# Patient Record
Sex: Female | Born: 1955 | Race: Black or African American | Hispanic: No | Marital: Single | Smoking: Former smoker
Health system: Southern US, Community
[De-identification: ages and names within clinical notes are randomized; demographics above are authoritative.]

## PROBLEM LIST (undated history)

## (undated) ENCOUNTER — Ambulatory Visit

## (undated) DIAGNOSIS — F1010 Alcohol abuse, uncomplicated: Secondary | ICD-10-CM

## (undated) DIAGNOSIS — Z9989 Dependence on other enabling machines and devices: Secondary | ICD-10-CM

## (undated) DIAGNOSIS — J439 Emphysema, unspecified: Secondary | ICD-10-CM

## (undated) DIAGNOSIS — J449 Chronic obstructive pulmonary disease, unspecified: Secondary | ICD-10-CM

## (undated) DIAGNOSIS — J45909 Unspecified asthma, uncomplicated: Secondary | ICD-10-CM

## (undated) DIAGNOSIS — U071 COVID-19: Secondary | ICD-10-CM

## (undated) DIAGNOSIS — R32 Unspecified urinary incontinence: Secondary | ICD-10-CM

## (undated) DIAGNOSIS — K219 Gastro-esophageal reflux disease without esophagitis: Secondary | ICD-10-CM

## (undated) DIAGNOSIS — G4733 Obstructive sleep apnea (adult) (pediatric): Secondary | ICD-10-CM

## (undated) DIAGNOSIS — K279 Peptic ulcer, site unspecified, unspecified as acute or chronic, without hemorrhage or perforation: Secondary | ICD-10-CM

## (undated) DIAGNOSIS — L509 Urticaria, unspecified: Secondary | ICD-10-CM

## (undated) DIAGNOSIS — T783XXA Angioneurotic edema, initial encounter: Secondary | ICD-10-CM

## (undated) DIAGNOSIS — N39498 Other specified urinary incontinence: Secondary | ICD-10-CM

## (undated) DIAGNOSIS — G5600 Carpal tunnel syndrome, unspecified upper limb: Secondary | ICD-10-CM

## (undated) DIAGNOSIS — Z720 Tobacco use: Secondary | ICD-10-CM

## (undated) HISTORY — DX: Urticaria, unspecified: L50.9

## (undated) HISTORY — DX: Gastro-esophageal reflux disease without esophagitis: K21.9

## (undated) HISTORY — PX: COLONOSCOPY: SHX5424

## (undated) HISTORY — DX: Emphysema, unspecified: J43.9

## (undated) HISTORY — DX: Alcohol abuse, uncomplicated: F10.10

## (undated) HISTORY — DX: Peptic ulcer, site unspecified, unspecified as acute or chronic, without hemorrhage or perforation: K27.9

## (undated) HISTORY — DX: Chronic obstructive pulmonary disease, unspecified: J44.9

## (undated) HISTORY — DX: Unspecified asthma, uncomplicated: J45.909

## (undated) HISTORY — PX: TUBAL LIGATION: SHX77

## (undated) HISTORY — DX: COVID-19: U07.1

## (undated) HISTORY — PX: MANDIBLE SURGERY: SHX707

## (undated) HISTORY — DX: Tobacco use: Z72.0

---

## 1992-04-19 HISTORY — PX: BREAST EXCISIONAL BIOPSY: SUR124

## 1998-08-07 ENCOUNTER — Emergency Department (HOSPITAL_COMMUNITY): Admission: EM | Admit: 1998-08-07 | Discharge: 1998-08-07 | Payer: Self-pay | Admitting: Emergency Medicine

## 1998-10-03 ENCOUNTER — Encounter: Payer: Self-pay | Admitting: Emergency Medicine

## 1998-10-03 ENCOUNTER — Emergency Department (HOSPITAL_COMMUNITY): Admission: EM | Admit: 1998-10-03 | Discharge: 1998-10-03 | Payer: Self-pay | Admitting: Emergency Medicine

## 1999-05-25 ENCOUNTER — Emergency Department (HOSPITAL_COMMUNITY): Admission: EM | Admit: 1999-05-25 | Discharge: 1999-05-25 | Payer: Self-pay | Admitting: Internal Medicine

## 2001-04-15 ENCOUNTER — Emergency Department (HOSPITAL_COMMUNITY): Admission: EM | Admit: 2001-04-15 | Discharge: 2001-04-15 | Payer: Self-pay | Admitting: Emergency Medicine

## 2003-03-22 ENCOUNTER — Inpatient Hospital Stay (HOSPITAL_COMMUNITY): Admission: AD | Admit: 2003-03-22 | Discharge: 2003-03-23 | Payer: Self-pay | Admitting: Family Medicine

## 2003-03-25 ENCOUNTER — Inpatient Hospital Stay (HOSPITAL_COMMUNITY): Admission: AD | Admit: 2003-03-25 | Discharge: 2003-03-25 | Payer: Self-pay | Admitting: Obstetrics & Gynecology

## 2003-04-09 ENCOUNTER — Encounter: Admission: RE | Admit: 2003-04-09 | Discharge: 2003-04-09 | Payer: Self-pay | Admitting: Obstetrics and Gynecology

## 2003-04-09 ENCOUNTER — Encounter (INDEPENDENT_AMBULATORY_CARE_PROVIDER_SITE_OTHER): Payer: Self-pay | Admitting: Specialist

## 2003-04-26 ENCOUNTER — Ambulatory Visit (HOSPITAL_COMMUNITY): Admission: RE | Admit: 2003-04-26 | Discharge: 2003-04-26 | Payer: Self-pay | Admitting: Obstetrics and Gynecology

## 2004-07-01 ENCOUNTER — Emergency Department (HOSPITAL_COMMUNITY): Admission: EM | Admit: 2004-07-01 | Discharge: 2004-07-01 | Payer: Self-pay | Admitting: Emergency Medicine

## 2004-10-21 ENCOUNTER — Emergency Department (HOSPITAL_COMMUNITY): Admission: EM | Admit: 2004-10-21 | Discharge: 2004-10-21 | Payer: Self-pay | Admitting: Family Medicine

## 2006-07-02 ENCOUNTER — Emergency Department (HOSPITAL_COMMUNITY): Admission: EM | Admit: 2006-07-02 | Discharge: 2006-07-03 | Payer: Self-pay | Admitting: Emergency Medicine

## 2006-07-09 ENCOUNTER — Inpatient Hospital Stay (HOSPITAL_COMMUNITY): Admission: EM | Admit: 2006-07-09 | Discharge: 2006-07-13 | Payer: Self-pay | Admitting: Emergency Medicine

## 2006-07-09 ENCOUNTER — Ambulatory Visit: Payer: Self-pay | Admitting: Internal Medicine

## 2006-07-11 ENCOUNTER — Ambulatory Visit: Payer: Self-pay | Admitting: Internal Medicine

## 2006-07-14 ENCOUNTER — Telehealth: Payer: Self-pay | Admitting: *Deleted

## 2006-07-14 ENCOUNTER — Ambulatory Visit: Payer: Self-pay | Admitting: Internal Medicine

## 2006-07-26 ENCOUNTER — Ambulatory Visit: Payer: Self-pay | Admitting: Internal Medicine

## 2006-07-26 ENCOUNTER — Encounter (INDEPENDENT_AMBULATORY_CARE_PROVIDER_SITE_OTHER): Payer: Self-pay | Admitting: *Deleted

## 2006-07-26 DIAGNOSIS — J4489 Other specified chronic obstructive pulmonary disease: Secondary | ICD-10-CM | POA: Insufficient documentation

## 2006-07-26 DIAGNOSIS — Z8711 Personal history of peptic ulcer disease: Secondary | ICD-10-CM

## 2006-07-26 DIAGNOSIS — J449 Chronic obstructive pulmonary disease, unspecified: Secondary | ICD-10-CM | POA: Insufficient documentation

## 2006-07-26 LAB — CONVERTED CEMR LAB
ALT: 20 units/L (ref 0–35)
AST: 21 units/L (ref 0–37)
Albumin: 3.3 g/dL — ABNORMAL LOW (ref 3.5–5.2)
Alkaline Phosphatase: 97 units/L (ref 39–117)
BUN: 3 mg/dL — ABNORMAL LOW (ref 6–23)
Basophils Absolute: 0.1 10*3/uL (ref 0.0–0.1)
Basophils Relative: 1 % (ref 0–1)
Calcium: 9.6 mg/dL (ref 8.4–10.5)
Creatinine, Ser: 0.93 mg/dL (ref 0.40–1.20)
Eosinophils Absolute: 1.8 10*3/uL — ABNORMAL HIGH (ref 0.0–0.7)
Glucose, Bld: 98 mg/dL (ref 70–99)
Hemoglobin: 12.9 g/dL (ref 12.0–15.0)
MCHC: 32.8 g/dL (ref 30.0–36.0)
Monocytes Relative: 6 % (ref 3–11)
Neutrophils Relative %: 51 % (ref 43–77)
Platelets: 397 10*3/uL (ref 150–400)
Total Protein: 7 g/dL (ref 6.0–8.3)
WBC: 9.4 10*3/uL (ref 4.0–10.5)

## 2006-07-29 ENCOUNTER — Ambulatory Visit: Payer: Self-pay | Admitting: Internal Medicine

## 2006-07-29 DIAGNOSIS — J441 Chronic obstructive pulmonary disease with (acute) exacerbation: Secondary | ICD-10-CM

## 2006-08-12 ENCOUNTER — Emergency Department (HOSPITAL_COMMUNITY): Admission: EM | Admit: 2006-08-12 | Discharge: 2006-08-12 | Payer: Self-pay | Admitting: *Deleted

## 2006-08-16 ENCOUNTER — Ambulatory Visit: Payer: Self-pay | Admitting: Internal Medicine

## 2006-08-18 ENCOUNTER — Inpatient Hospital Stay (HOSPITAL_COMMUNITY): Admission: AD | Admit: 2006-08-18 | Discharge: 2006-08-21 | Payer: Self-pay | Admitting: Internal Medicine

## 2006-08-18 ENCOUNTER — Telehealth (INDEPENDENT_AMBULATORY_CARE_PROVIDER_SITE_OTHER): Payer: Self-pay | Admitting: *Deleted

## 2006-08-18 ENCOUNTER — Ambulatory Visit: Payer: Self-pay | Admitting: Internal Medicine

## 2006-08-24 ENCOUNTER — Ambulatory Visit: Payer: Self-pay | Admitting: Internal Medicine

## 2006-08-24 DIAGNOSIS — J45909 Unspecified asthma, uncomplicated: Secondary | ICD-10-CM | POA: Insufficient documentation

## 2006-08-30 ENCOUNTER — Ambulatory Visit: Payer: Self-pay | Admitting: Internal Medicine

## 2006-08-30 ENCOUNTER — Encounter (INDEPENDENT_AMBULATORY_CARE_PROVIDER_SITE_OTHER): Payer: Self-pay | Admitting: *Deleted

## 2006-08-31 ENCOUNTER — Telehealth: Payer: Self-pay | Admitting: *Deleted

## 2006-09-01 ENCOUNTER — Encounter (INDEPENDENT_AMBULATORY_CARE_PROVIDER_SITE_OTHER): Payer: Self-pay | Admitting: *Deleted

## 2006-10-25 ENCOUNTER — Encounter (INDEPENDENT_AMBULATORY_CARE_PROVIDER_SITE_OTHER): Payer: Self-pay | Admitting: Internal Medicine

## 2006-10-25 ENCOUNTER — Ambulatory Visit: Payer: Self-pay | Admitting: Pulmonary Disease

## 2006-11-15 ENCOUNTER — Ambulatory Visit: Payer: Self-pay | Admitting: Pulmonary Disease

## 2007-01-15 ENCOUNTER — Ambulatory Visit: Payer: Self-pay | Admitting: Infectious Diseases

## 2007-01-15 ENCOUNTER — Inpatient Hospital Stay (HOSPITAL_COMMUNITY): Admission: EM | Admit: 2007-01-15 | Discharge: 2007-01-17 | Payer: Self-pay | Admitting: Emergency Medicine

## 2007-01-24 ENCOUNTER — Encounter (INDEPENDENT_AMBULATORY_CARE_PROVIDER_SITE_OTHER): Payer: Self-pay | Admitting: Internal Medicine

## 2007-01-24 ENCOUNTER — Ambulatory Visit: Payer: Self-pay | Admitting: *Deleted

## 2007-01-24 ENCOUNTER — Ambulatory Visit: Payer: Self-pay | Admitting: Hospitalist

## 2007-01-24 DIAGNOSIS — N39498 Other specified urinary incontinence: Secondary | ICD-10-CM

## 2007-01-24 DIAGNOSIS — F17200 Nicotine dependence, unspecified, uncomplicated: Secondary | ICD-10-CM

## 2007-01-24 HISTORY — DX: Other specified urinary incontinence: N39.498

## 2007-01-24 LAB — CONVERTED CEMR LAB
Eosinophils Relative: 4 % (ref 0–5)
HCT: 38.6 % (ref 36.0–46.0)
Hemoglobin: 12.7 g/dL (ref 12.0–15.0)
Lymphocytes Relative: 35 % (ref 12–46)
Lymphs Abs: 4.5 10*3/uL — ABNORMAL HIGH (ref 0.7–3.3)
Neutro Abs: 6.7 10*3/uL (ref 1.7–7.7)
Neutrophils Relative %: 54 % (ref 43–77)
Platelets: 335 10*3/uL (ref 150–400)
WBC: 12.8 10*3/uL — ABNORMAL HIGH (ref 4.0–10.5)

## 2007-01-31 ENCOUNTER — Ambulatory Visit: Payer: Self-pay | Admitting: Infectious Diseases

## 2007-05-31 ENCOUNTER — Ambulatory Visit (HOSPITAL_COMMUNITY): Admission: RE | Admit: 2007-05-31 | Discharge: 2007-05-31 | Payer: Self-pay | Admitting: Internal Medicine

## 2007-05-31 ENCOUNTER — Encounter (INDEPENDENT_AMBULATORY_CARE_PROVIDER_SITE_OTHER): Payer: Self-pay | Admitting: *Deleted

## 2007-05-31 ENCOUNTER — Ambulatory Visit: Payer: Self-pay | Admitting: Internal Medicine

## 2007-05-31 DIAGNOSIS — K089 Disorder of teeth and supporting structures, unspecified: Secondary | ICD-10-CM | POA: Insufficient documentation

## 2007-05-31 DIAGNOSIS — K047 Periapical abscess without sinus: Secondary | ICD-10-CM

## 2007-05-31 DIAGNOSIS — M25539 Pain in unspecified wrist: Secondary | ICD-10-CM

## 2007-05-31 LAB — CONVERTED CEMR LAB
ALT: 13 units/L (ref 0–35)
Albumin: 4.2 g/dL (ref 3.5–5.2)
BUN: 10 mg/dL (ref 6–23)
Basophils Relative: 1 % (ref 0–1)
CO2: 21 meq/L (ref 19–32)
Chloride: 105 meq/L (ref 96–112)
Eosinophils Relative: 6 % — ABNORMAL HIGH (ref 0–5)
HCT: 42 % (ref 36.0–46.0)
MCHC: 32.9 g/dL (ref 30.0–36.0)
MCV: 81.6 fL (ref 78.0–100.0)
Monocytes Absolute: 0.7 10*3/uL (ref 0.1–1.0)
RDW: 13.9 % (ref 11.5–15.5)
Total Bilirubin: 0.5 mg/dL (ref 0.3–1.2)

## 2007-06-07 ENCOUNTER — Ambulatory Visit (HOSPITAL_COMMUNITY): Admission: RE | Admit: 2007-06-07 | Discharge: 2007-06-07 | Payer: Self-pay | Admitting: *Deleted

## 2007-06-08 ENCOUNTER — Ambulatory Visit: Payer: Self-pay | Admitting: Internal Medicine

## 2007-06-08 ENCOUNTER — Encounter (INDEPENDENT_AMBULATORY_CARE_PROVIDER_SITE_OTHER): Payer: Self-pay | Admitting: *Deleted

## 2007-06-08 DIAGNOSIS — M654 Radial styloid tenosynovitis [de Quervain]: Secondary | ICD-10-CM

## 2007-08-18 ENCOUNTER — Emergency Department (HOSPITAL_COMMUNITY): Admission: EM | Admit: 2007-08-18 | Discharge: 2007-08-18 | Payer: Self-pay | Admitting: Emergency Medicine

## 2007-11-27 ENCOUNTER — Ambulatory Visit: Payer: Self-pay | Admitting: Internal Medicine

## 2007-11-27 ENCOUNTER — Encounter (INDEPENDENT_AMBULATORY_CARE_PROVIDER_SITE_OTHER): Payer: Self-pay | Admitting: Internal Medicine

## 2007-11-27 DIAGNOSIS — L538 Other specified erythematous conditions: Secondary | ICD-10-CM

## 2007-11-27 LAB — CONVERTED CEMR LAB
BUN: 18 mg/dL (ref 6–23)
Basophils Relative: 1 % (ref 0–1)
CO2: 25 meq/L (ref 19–32)
Chloride: 108 meq/L (ref 96–112)
Creatinine, Ser: 0.97 mg/dL (ref 0.40–1.20)
Eosinophils Absolute: 0.8 10*3/uL — ABNORMAL HIGH (ref 0.0–0.7)
Glucose, Bld: 79 mg/dL (ref 70–99)
Hemoglobin: 14.5 g/dL (ref 12.0–15.0)
Lymphs Abs: 2.5 10*3/uL (ref 0.7–4.0)
MCHC: 33.3 g/dL (ref 30.0–36.0)
MCV: 81.3 fL (ref 78.0–100.0)
Monocytes Absolute: 0.6 10*3/uL (ref 0.1–1.0)
Monocytes Relative: 7 % (ref 3–12)
Neutro Abs: 4.6 10*3/uL (ref 1.7–7.7)
Platelets: 260 10*3/uL (ref 150–400)
Potassium: 4.4 meq/L (ref 3.5–5.3)
RBC: 5.35 M/uL — ABNORMAL HIGH (ref 3.87–5.11)
RDW: 14.5 % (ref 11.5–15.5)
WBC: 8.5 10*3/uL (ref 4.0–10.5)

## 2007-12-11 ENCOUNTER — Ambulatory Visit: Payer: Self-pay | Admitting: Internal Medicine

## 2007-12-20 ENCOUNTER — Ambulatory Visit: Payer: Self-pay | Admitting: Internal Medicine

## 2008-01-13 ENCOUNTER — Emergency Department (HOSPITAL_COMMUNITY): Admission: EM | Admit: 2008-01-13 | Discharge: 2008-01-13 | Payer: Self-pay | Admitting: Emergency Medicine

## 2008-01-21 ENCOUNTER — Inpatient Hospital Stay (HOSPITAL_COMMUNITY): Admission: EM | Admit: 2008-01-21 | Discharge: 2008-01-23 | Payer: Self-pay | Admitting: Emergency Medicine

## 2008-01-21 ENCOUNTER — Ambulatory Visit: Payer: Self-pay | Admitting: Internal Medicine

## 2008-01-23 ENCOUNTER — Encounter (INDEPENDENT_AMBULATORY_CARE_PROVIDER_SITE_OTHER): Payer: Self-pay | Admitting: Internal Medicine

## 2008-01-31 ENCOUNTER — Ambulatory Visit: Payer: Self-pay | Admitting: Infectious Disease

## 2008-02-23 ENCOUNTER — Encounter: Admission: RE | Admit: 2008-02-23 | Discharge: 2008-02-23 | Payer: Self-pay | Admitting: Family Medicine

## 2008-03-25 ENCOUNTER — Encounter (INDEPENDENT_AMBULATORY_CARE_PROVIDER_SITE_OTHER): Payer: Self-pay | Admitting: Internal Medicine

## 2009-06-06 ENCOUNTER — Ambulatory Visit: Payer: Self-pay | Admitting: Internal Medicine

## 2009-06-06 DIAGNOSIS — N898 Other specified noninflammatory disorders of vagina: Secondary | ICD-10-CM | POA: Insufficient documentation

## 2009-06-06 LAB — CONVERTED CEMR LAB: Chlamydia, DNA Probe: NEGATIVE

## 2009-06-09 ENCOUNTER — Ambulatory Visit: Payer: Self-pay | Admitting: Internal Medicine

## 2009-06-09 LAB — CONVERTED CEMR LAB
Cholesterol: 192 mg/dL (ref 0–200)
HDL: 70 mg/dL (ref >39)
Total CHOL/HDL Ratio: 2.7

## 2009-06-11 ENCOUNTER — Telehealth (INDEPENDENT_AMBULATORY_CARE_PROVIDER_SITE_OTHER): Payer: Self-pay | Admitting: Internal Medicine

## 2009-06-11 ENCOUNTER — Ambulatory Visit (HOSPITAL_COMMUNITY): Admission: RE | Admit: 2009-06-11 | Discharge: 2009-06-11 | Payer: Self-pay | Admitting: Internal Medicine

## 2009-07-08 ENCOUNTER — Telehealth (INDEPENDENT_AMBULATORY_CARE_PROVIDER_SITE_OTHER): Payer: Self-pay | Admitting: Internal Medicine

## 2009-07-08 ENCOUNTER — Ambulatory Visit: Payer: Self-pay | Admitting: Gastroenterology

## 2009-07-09 ENCOUNTER — Ambulatory Visit: Payer: Self-pay | Admitting: Internal Medicine

## 2009-07-11 ENCOUNTER — Encounter (INDEPENDENT_AMBULATORY_CARE_PROVIDER_SITE_OTHER): Payer: Self-pay | Admitting: Internal Medicine

## 2009-07-11 ENCOUNTER — Emergency Department (HOSPITAL_COMMUNITY): Admission: EM | Admit: 2009-07-11 | Discharge: 2009-07-11 | Payer: Self-pay | Admitting: Family Medicine

## 2009-07-15 ENCOUNTER — Telehealth: Payer: Self-pay | Admitting: Gastroenterology

## 2009-07-23 ENCOUNTER — Ambulatory Visit: Payer: Self-pay | Admitting: Gastroenterology

## 2009-07-28 ENCOUNTER — Ambulatory Visit: Payer: Self-pay | Admitting: Internal Medicine

## 2009-10-21 ENCOUNTER — Emergency Department (HOSPITAL_COMMUNITY): Admission: EM | Admit: 2009-10-21 | Discharge: 2009-10-21 | Payer: Self-pay | Admitting: Family Medicine

## 2009-10-23 ENCOUNTER — Telehealth: Payer: Self-pay | Admitting: Internal Medicine

## 2009-11-03 ENCOUNTER — Emergency Department (HOSPITAL_COMMUNITY): Admission: EM | Admit: 2009-11-03 | Discharge: 2009-11-03 | Payer: Self-pay | Admitting: Emergency Medicine

## 2009-12-03 ENCOUNTER — Emergency Department (HOSPITAL_COMMUNITY): Admission: EM | Admit: 2009-12-03 | Discharge: 2009-12-03 | Payer: Self-pay | Admitting: Family Medicine

## 2009-12-16 ENCOUNTER — Telehealth: Payer: Self-pay | Admitting: Internal Medicine

## 2009-12-16 ENCOUNTER — Ambulatory Visit: Payer: Self-pay | Admitting: Internal Medicine

## 2009-12-16 DIAGNOSIS — T783XXA Angioneurotic edema, initial encounter: Secondary | ICD-10-CM

## 2009-12-16 DIAGNOSIS — L508 Other urticaria: Secondary | ICD-10-CM | POA: Insufficient documentation

## 2009-12-16 HISTORY — DX: Angioneurotic edema, initial encounter: T78.3XXA

## 2009-12-16 LAB — CONVERTED CEMR LAB
Albumin: 3.8 g/dL (ref 3.5–5.2)
Amphetamine Screen, Ur: NEGATIVE
Barbiturate Quant, Ur: NEGATIVE
Benzodiazepines.: NEGATIVE
Cocaine Metabolites: NEGATIVE
Glucose, Bld: 85 mg/dL (ref 70–99)
HCT: 44.7 % (ref 36.0–46.0)
MCV: 82.6 fL (ref >=78.0)
Marijuana Metabolite: NEGATIVE
Methadone: NEGATIVE
Propoxyphene: NEGATIVE
RBC: 5.41 M/uL — ABNORMAL HIGH (ref 3.87–5.11)
RDW: 14.6 % (ref 11.5–15.5)
Sodium: 144 meq/L (ref 135–145)
TSH: 1.045 microintl units/mL (ref 0.350–4.5)
Total Bilirubin: 0.3 mg/dL (ref 0.3–1.2)

## 2009-12-24 ENCOUNTER — Ambulatory Visit: Payer: Self-pay | Admitting: Internal Medicine

## 2009-12-24 DIAGNOSIS — K219 Gastro-esophageal reflux disease without esophagitis: Secondary | ICD-10-CM

## 2010-01-09 ENCOUNTER — Telehealth: Payer: Self-pay | Admitting: Ophthalmology

## 2010-01-21 ENCOUNTER — Emergency Department (HOSPITAL_COMMUNITY): Admission: EM | Admit: 2010-01-21 | Discharge: 2010-01-21 | Payer: Self-pay | Admitting: Emergency Medicine

## 2010-01-23 ENCOUNTER — Telehealth: Payer: Self-pay | Admitting: *Deleted

## 2010-02-02 ENCOUNTER — Ambulatory Visit: Payer: Self-pay | Admitting: Internal Medicine

## 2010-02-02 ENCOUNTER — Telehealth: Payer: Self-pay | Admitting: Internal Medicine

## 2010-02-16 ENCOUNTER — Encounter: Payer: Self-pay | Admitting: Internal Medicine

## 2010-02-16 ENCOUNTER — Ambulatory Visit (HOSPITAL_COMMUNITY): Admission: RE | Admit: 2010-02-16 | Discharge: 2010-02-16 | Payer: Self-pay | Admitting: Internal Medicine

## 2010-02-16 ENCOUNTER — Ambulatory Visit: Payer: Self-pay | Admitting: Internal Medicine

## 2010-02-25 ENCOUNTER — Telehealth: Payer: Self-pay | Admitting: Ophthalmology

## 2010-03-04 ENCOUNTER — Ambulatory Visit: Payer: Self-pay | Admitting: Internal Medicine

## 2010-03-04 LAB — CONVERTED CEMR LAB
CRP: 0.7 mg/dL — ABNORMAL HIGH (ref <0.6)
Complement C4, Body Fluid: 32 mg/dL (ref 16–47)

## 2010-03-11 ENCOUNTER — Ambulatory Visit: Payer: Self-pay | Admitting: Internal Medicine

## 2010-03-17 ENCOUNTER — Telehealth: Payer: Self-pay | Admitting: Ophthalmology

## 2010-03-18 ENCOUNTER — Telehealth (INDEPENDENT_AMBULATORY_CARE_PROVIDER_SITE_OTHER): Payer: Self-pay | Admitting: *Deleted

## 2010-03-19 ENCOUNTER — Telehealth: Payer: Self-pay | Admitting: *Deleted

## 2010-04-29 ENCOUNTER — Ambulatory Visit: Admission: RE | Admit: 2010-04-29 | Discharge: 2010-04-29 | Payer: Self-pay | Source: Home / Self Care

## 2010-05-01 ENCOUNTER — Encounter: Payer: Self-pay | Admitting: Ophthalmology

## 2010-05-07 ENCOUNTER — Other Ambulatory Visit (HOSPITAL_COMMUNITY): Payer: Self-pay | Admitting: *Deleted

## 2010-05-07 DIAGNOSIS — Z0000 Encounter for general adult medical examination without abnormal findings: Secondary | ICD-10-CM

## 2010-05-07 DIAGNOSIS — Z1231 Encounter for screening mammogram for malignant neoplasm of breast: Secondary | ICD-10-CM

## 2010-05-10 ENCOUNTER — Encounter: Payer: Self-pay | Admitting: Infectious Diseases

## 2010-05-10 ENCOUNTER — Encounter: Payer: Self-pay | Admitting: *Deleted

## 2010-05-21 NOTE — Progress Notes (Signed)
  Phone Note Outgoing Call   Call placed to: Patient Request: Send information Summary of Call: Patient called to further evaluate pts asthma symptoms.  Pt had been using her albuterol inhaler every 4 hours because of SOB.      New/Updated Medications: FLOVENT HFA 110 MCG/ACT AERO (FLUTICASONE PROPIONATE  HFA) 1 puff twice daily. Prescriptions: FLOVENT HFA 110 MCG/ACT AERO (FLUTICASONE PROPIONATE  HFA) 1 puff twice daily.  #1 inhaler x 1   Entered and Authorized by:   Sinda Du MD   Signed by:   Sinda Du MD on 03/17/2010   Method used:   Printed then faxed to ...       [facility] (retail)       [address] [place], [place]  [address]       Ph: [phone]       Fax: [phone]   RxID:   [id number]    Impression & Recommendations:  Problem # 1:  ASTHMA (ICD-493.90) Pt has been requiring her albuterol every 4 hours which can lead to tolerance.  As such I have started her on Flovent two times a day and would like to see her back in one week to insure that she is tolerating the medication well.  Pt will call for appt.  At her next visit pt will need a peak flow and meter.   Her updated medication list for this problem includes:    Albuterol 90 Mcg/act Aers (Albuterol) .[name]..[name] Two puffs every four hours as needed    Singulair 10 Mg Tabs (Montelukast sodium) .[name]... Take 1 tablet by mouth once a day    Prednisone 20 Mg Tabs (Prednisone) .[name]... Take 2 tablets for 2 days, 1 tablet for 2 days , 1/2 tablet for 2 days and then stop.    Flovent Hfa 110 Mcg/act Aero (Fluticasone propionate  hfa) .[name]... 1 puff twice daily.  Complete Medication List: 1)  Albuterol 90 Mcg/act Aers (Albuterol) .... Two puffs every four hours as needed 2)  Singulair 10 Mg Tabs (Montelukast sodium) .... Take 1 tablet by mouth once a day 3)  Hydrocortisone 1 % Crea (Hydrocortisone) .... Apply to the affected area 3-4 times a day 4)  Zyrtec Allergy 10 Mg Caps (Cetirizine  hcl) .... Take one tab by mouth daily 5)  Protonix 40 Mg Solr (Pantoprazole sodium) .... One a day 6)  Epipen 0.3 Mg/0.61ml Devi (Epinephrine) .... Use as directed 7)  Prednisone 20 Mg Tabs (Prednisone) .... Take 2 tablets for 2 days, 1 tablet for 2 days , 1/2 tablet for 2 days and then stop. 8)  Diphenhydramine Hcl 25 Mg Tabs (Diphenhydramine hcl) .... Take 1 tab very 4 hours as needed for swelling 9)  Pepcid 20 Mg Tabs (Famotidine) .... Take 1 tablet by mouth two times a day 10)  Flovent Hfa 110 Mcg/act Aero (Fluticasone propionate  hfa) .[name].. 1 puff twice daily.  Allergies: 1)  ! Aspirin  Appended Document:  Flovent Rx called to [facility] pharmacy.

## 2010-05-21 NOTE — Assessment & Plan Note (Signed)
Summary: COMPLETE PHYSICAL/WITH PAP SMEAR/EST/VS   Vital Signs:  Patient profile:   55 year old female Height:      62 inches (157.48 cm) Weight:      199.01 pounds (90.46 kg) BMI:     36.53 Temp:     97.4 degrees F (36.33 degrees C) oral Pulse rate:   62 / minute BP sitting:   121 / 80  (left arm)  Vitals Entered By: Angelina Ok RN (June 06, 2009 9:29 AM) CC: Depression Is Patient Diabetic? No Pain Assessment Patient in pain? no      Nutritional Status BMI of > 30 = obese  Have you ever been in a relationship where you felt threatened, hurt or afraid?No   Does patient need assistance? Functional Status Self care Ambulation Normal Comments Check up.  Last few months has a weird feeling.  Was on Vitamins then stopped.  Needs a physical.  Odor and yellow vaginal discharge.  No itching.  Needs refills on meds.  Advair and Singulair.  Needs a Mammogram.   Primary Care Provider:  Zara Council MD  CC:  Depression.  History of Present Illness: Maria Nelson is a 55 year old Female with PMH/problems of COPD, PUD, h/o tobacco abuse who comes to the clinic today:  1. vaginal discharge: going on for two months now, odor is foul and bothers her. yellowish, no fever, pain. Had menopause about four years ago. Last pap smear two years ago. No abd pain, weight loss.   2. needs mammogram, flu shot and pnuemonia shot.   3. leg pain: has leg pain on the left lower leg for the past few weeks. Usually associated with walking, she has started to walk for good health recently. No swelling, trauma, fever, rash, CP, SOB or any systemic complaints.     Depression History:      The patient is having a depressed mood most of the day but denies diminished interest in her usual daily activities.        The patient denies that she feels like life is not worth living, denies that she wishes that she were dead, and denies that she has thought about ending her life.        Comments:   Occassional- unemplyed.   Preventive Screening-Counseling & Management  Alcohol-Tobacco     Alcohol type: BEER on the w/e     Smoking Status: quit     Smoking Cessation Counseling: yes     Packs/Day: 1.0 per week     Year Quit: yesterday, 01/30/08  Comments: Proud she is cutting.  Current Medications (verified): 1)  Albuterol 90 Mcg/act Aers (Albuterol) .... Two Puffs Every Four Hours As Needed 2)  Advair Diskus 250-50 Mcg/dose Misc (Fluticasone-Salmeterol) .[name].. 1 Puff Two Times A Day 3)  Singulair 10 Mg Tabs (Montelukast Sodium) .... Take 1 Tablet By Mouth Once A Day  Allergies: 1)  ! Aspirin  Past History:  Past Medical History: Last updated: 11/27/2007 COPD 2/2 Tobacco use (need PFT's for verification) Gastrointestinal hemorrhage, hx of 2/2 PUD on 07-03-06     requiring blood transfusion Peptic ulcer disease +H. pylori (antigen)- Dr. Juanda Chance       Treated with protonix 40 two times a day, Biaxin 600 two times a day x14, and Amox 1000mg  two times a       day for 14 days Etoh abuse:  as per patient stopped 16-Mar-2008after death of sister      no  h/o withdrawl Tobacco abuse:  stopped as per patient 03-22-08after death of sister  Past Surgical History: Last updated: 07/26/2006 Tubal ligation (remote) Jaw surgery 2/2 fx from trauma (remote)  Family History: Last updated: 07/26/2006 Family History of Alcoholism/Addiction Family History Diabetes 1st degree relative  Social History: Last updated: 01/24/2007 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    Physical Exam  General:  alert, well-developed, and overweight-appearing.  alert, well-developed, and overweight-appearing.   Eyes:  vision grossly intact, pupils equal, pupils round, and pupils reactive to light.  vision grossly intact, pupils equal, pupils round, and pupils reactive to light.   Mouth:  pharynx pink and moist.  pharynx pink and moist.   Neck:  supple.  supple.   Lungs:  normal respiratory effort and normal breath sounds.  normal respiratory effort and normal breath sounds.   Heart:  normal rate and regular rhythm.  normal rate and regular rhythm.   Abdomen:  soft and non-tender.  soft and non-tender.   Genitalia:  normal introitus and no external lesions.  whitish scanty discharge.  Pulses:  normal peripheral pulses  Extremities:  no cyanosis, clubbing or edema  Neurologic:  grossly non focal.    Impression & Recommendations:  Problem # 1:  VAGINAL DISCHARGE (ICD-623.5)  Wet prep, GC CT done today. Will treat based on the results.   Orders: T-Wet Prep (in-house) [phone]) T-Chlamydia & GC Probe, Genital ([id number])  Problem # 2:  PREVENTIVE HEALTH CARE (ICD-V70.0)  fluvax, pneumonia vaccine done today. pap smear as above. colonoscopy and mammogram ordered. FLP and fasting CBG pending.   Orders: Gastroenterology Referral (GI) Mammogram (Screening) (Mammo) T-Pap Smear (88150)Future Orders: T-Lipid Profile ([id number]) ... 06/13/2009 T-Glucose, Blood ([id number]) ... 06/13/2009  Problem # 3:  LEG PAIN (ICD-729.5) This is likely musculoskeletal related to her obese habitus and recent exertion. I doubt this is due to claudication, but will continue to keep an eye on it.   Problem # 4:  TOBACCO ABUSE (ICD-305.1) Counselled. Will continue to reinforce on follow up visits.   Problem # 5:  COPD (ICD-496) Stable. Will continue with current regimen. Smoking cessation emphasized.   The following medications were removed from the  medication list:    Spiriva Handihaler 18 Mcg Caps (Tiotropium bromide monohydrate) .[name]... Take 1 tablet by mouth once a day Her updated medication list for this problem includes:    Albuterol 90 Mcg/act Aers (Albuterol) .[name]..[name] Two puffs every four hours as needed    Advair Diskus 250-50 Mcg/dose Misc (Fluticasone-salmeterol) .[name]... 1 puff two times a day    Singulair 10 Mg Tabs (Montelukast sodium) .[name]... Take 1 tablet by mouth once a day  Complete Medication List: 1)  Albuterol 90 Mcg/act Aers (Albuterol) .... Two puffs every four hours as needed 2)  Advair Diskus 250-50 Mcg/dose Misc (Fluticasone-salmeterol) .[name].. 1 puff two times a day 3)  Singulair 10 Mg Tabs (Montelukast sodium) .... Take 1 tablet by mouth once a day  Patient Instructions: 1)  Please schedule a follow-up appointment in 3-6 months. 2)  Do let us know  if your problem worsens.   3)  We will let you know if anything wrong with your lab work.   4)  Please come back for a fasting cholesterol check.  5)  Tobacco is very bad for your health and your loved ones! You Should stop smoking!. 6)  Stop Smoking Tips: Choose a Quit date. Cut down before the Quit date. decide what you will do as a substitute when you feel the urge to smoke(gum,toothpick,exercise).  Prevention & Chronic Care Immunizations   Influenza vaccine: Fluvax Non-MCR  (01/24/2007)    Tetanus booster: Not documented    Pneumococcal vaccine: Not documented  Colorectal Screening   Hemoccult: Not documented    Colonoscopy: Not documented  Other Screening   Pap smear: Not documented    Mammogram: Not documented   Smoking status: quit  (06/06/2009)  Lipids   Total Cholesterol: Not documented   LDL: Not documented   LDL Direct: Not documented   HDL: Not documented   Triglycerides: Not documented  Process Orders Check Orders Results:     [facility]: ABN not required for this insurance Tests Sent for requisitioning (June 06, 2009  10:13 AM):     06/13/2009: [facility] -- T-Lipid Profile [phone] (signed)     06/06/2009: [facility] -- T-Pap Smear [[id number]] (signed)     06/06/2009: [facility] -- T-Chlamydia & GC Probe, Genital [[id number]] (signed)     06/13/2009: [facility] -- Hale Bogus, Blood [[id number]] (signed)     Vital Signs:  Patient profile:   55 year old female Height:      62 inches (157.48 cm) Weight:      199.01 pounds (90.46 kg) BMI:     36.53 Temp:     97.4 degrees F (36.33 degrees C) oral Pulse rate:   62 / minute BP sitting:   121 / 80  (left arm)  Vitals Entered By: Angelina Ok RN (June 06, 2009 9:29 AM)    Appended Document: COMPLETE PHYSICAL/WITH PAP SMEAR/EST/VS   Immunizations Administered:  Influenza Vaccine # 1:    Vaccine Type: Fluvax Non-MCR    Site: left deltoid    Mfr: Novartis    Dose: 0.5 ml    Route: IM    Given by: Angelina Ok RN    Exp. Date: 07/2009    Lot #: [id number]    VIS given: 11/10/06 version given June 06, 2009.  Pneumonia Vaccine:    Vaccine Type: Pneumovax    Site: left deltoid    Mfr: Merck    Dose: 0.5 ml    Route: IM    Given by: Angelina Ok RN    Exp. Date: 11/22/2010    Lot #: [id number]    VIS given: 11/15/95 version given June 06, 2009.  Flu Vaccine Consent Questions:    Do you have a history of severe allergic reactions to this vaccine? no    Any prior history of allergic reactions to egg and/or gelatin? no    Do you have a sensitivity to the preservative Thimersol? no    Do you have a past history of Guillan-Barre Syndrome? no    Do you currently have an acute febrile illness? no    Have you ever had a severe reaction to latex? no    Vaccine information given and explained to patient? yes    Are you currently pregnant? no   Appended Document: Orders Update/wet prep    Clinical Lists Changes  Orders:  Added new Test order of T-Wet Prep by  Molecular Probe [phone]) - Signed      Process Orders Check Orders Results:     [facility]: ABN not required for this insurance Tests Sent for requisitioning (June 06, 2009 1:41 PM):     06/06/2009: [facility] -- T-Wet Prep by [facility]be [phone] (signed)

## 2010-05-21 NOTE — Miscellaneous (Signed)
Summary: Orders Update  Clinical Lists Changes  Problems: Added new problem of PREVENTIVE HEALTH CARE (ICD-V70.0) Orders: Added new Test order of Mammogram (Screening) (Mammo) - Signed

## 2010-05-21 NOTE — Miscellaneous (Signed)
Summary: [facility] Previsit/prep  Clinical Lists Changes  Medications: Added new medication of DULCOLAX 5 MG  TBEC (BISACODYL) Day before procedure take 2 at 3pm and 2 at 8pm. - Signed Added new medication of METOCLOPRAMIDE HCL 10 MG  TABS (METOCLOPRAMIDE HCL) As per prep instructions. - Signed Added new medication of MIRALAX   POWD (POLYETHYLENE GLYCOL 3350) As per prep  instructions. - Signed Rx of DULCOLAX 5 MG  TBEC (BISACODYL) Day before procedure take 2 at 3pm and 2 at 8pm.;  #4 x 0;  Signed;  Entered by: Wyona Almas RN;  Authorized by: Mardella Layman MD [facility];  Method used: Print then Give to Patient Rx of METOCLOPRAMIDE HCL 10 MG  TABS (METOCLOPRAMIDE HCL) As per prep instructions.;  #2 x 0;  Signed;  Entered by: Wyona Almas RN;  Authorized by: Mardella Layman MD [facility];  Method used: Print then Give to Patient Rx of MIRALAX   POWD (POLYETHYLENE GLYCOL 3350) As per prep  instructions.;  #255gm x 0;  Signed;  Entered by: Wyona Almas RN;  Authorized by: Mardella Layman MD [facility];  Method used: Print then Give to Patient Observations: Added new observation of ALLERGY REV: Done (07/08/2009 10:04)    Prescriptions: MIRALAX   POWD (POLYETHYLENE GLYCOL 3350) As per prep  instructions.  #255gm x 0   Entered by:   Wyona Almas RN   Authorized by:   Mardella Layman MD [facility]   Signed by:   Wyona Almas RN on 07/08/2009   Method used:   Print then Give to Patient   RxID:   [id number] METOCLOPRAMIDE HCL 10 MG  TABS (METOCLOPRAMIDE HCL) As per prep instructions.  #2 x 0   Entered by:   Wyona Almas RN   Authorized by:   Mardella Layman MD [facility]   Signed by:   Wyona Almas RN on 07/08/2009   Method used:   Print then Give to Patient   RxID:   [id number] DULCOLAX 5 MG  TBEC (BISACODYL) Day before procedure take 2 at 3pm and 2 at 8pm.  #4 x 0   Entered by:   Wyona Almas RN   Authorized by:   Mardella Layman MD [facility]   Signed by:   Wyona Almas RN on  07/08/2009   Method used:   Print then Give to Patient   RxID:   [id number]

## 2010-05-21 NOTE — Assessment & Plan Note (Signed)
Summary: L side of face swollen w/ some drooping/pcp-bowen/hla   Vital Signs:  Patient profile:   55 year old female Height:      61 inches (154.94 cm) Weight:      206.1 pounds (93.68 kg) BMI:     39.08 Temp:     98.2 degrees F oral Pulse rate:   70 / minute Pulse (ortho):   63 / minute BP sitting:   92 / 65  (right arm) BP standing:   97 / 66  Vitals Entered By: Chinita Pester RN (December 16, 2009 1:48 PM)  Serial Vital Signs/Assessments:  Time      Position  BP       Pulse  Resp  Temp     By 2:10 PM   Lying RA  111/76   62                    Glenda Palmer RN 2:10 PM   Sitting   110/75   63                    Chinita Pester RN 2:10 PM   Standing  97/66    63                    Chinita Pester RN  CC: ?allergic  reaction - left side mouth swelling, lip tingling. Bottom lip had swollen very big last week. Taking Benadryl. Is Patient Diabetic? No Pain Assessment Patient in pain? no      Nutritional Status BMI of > 30 = obese  Have you ever been in a relationship where you felt threatened, hurt or afraid?No   Does patient need assistance? Functional Status Self care Ambulation Normal   Primary Care Provider:  Zara Council MD  CC:  ?allergic  reaction - left side mouth swelling and lip tingling. Bottom lip had swollen very big last week. Taking Benadryl.[name]  History of Present Illness: C/o left tongue and buccal mucosa swelling on and off for 2 months. Denies any SOB, wheezing,cough, rash, fever, leg edema.Deniesany use of new or topical products including OTC meds. Patient had an urgent care visit 2 months ago at the firstpresentation of Sx--respondedto a Tx with prednisone and Benadryl. Patient takesBenadryl OTC for 2 weeksnow--with noimprovement ofSx.   Depression History:      The patient denies a depressed mood most of the day and a diminished interest in her usual daily activities.         Preventive Screening-Counseling & Management  Alcohol-Tobacco     Alcohol  type: BEER on the w/e     Smoking Status: quit     Smoking Cessation Counseling: yes     Packs/Day: 1.0 per week     Year Quit: yesterday, 01/30/08  Caffeine-Diet-Exercise     Caffeine use/day: 0     Does Patient Exercise: yes     Type of exercise: WALKING     Exercise (avg: min/session): 1 HOUR     Times/week:     5  Current Problems (verified): 1)  Hypotension, Orthostatic  (ICD-458.0) 2)  Angioedema  (ICD-995.1) 3)  Skin Rash  (ICD-782.1) 4)  Leg Pain  (ICD-729.5) 5)  Vaginal Discharge  (ICD-623.5) 6)  Preventive Health Care  (ICD-V70.0) 7)  Intertrigo, Candidal  (ICD-695.89) 8)  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) 9)  Periapical Abscess Without Sinus  (ICD-522.5) 10)  Dental Pain  (ICD-525.9) 11)  Chronic Gingivitis Plaque Induced  (ICD-523.10)  12)  Pain in Joint, Forearm  (ICD-719.43) 13)  Stress Incontinence  (ICD-788.39) 14)  Tobacco Abuse  (ICD-305.1) 15)  Pain in or Around Eye  (ICD-379.91) 16)  Asthma  (ICD-493.90) 17)  Chronic Obstructive Pulmonary Disease, Acute Exacerbation  (ICD-491.21) 18)  Family History Diabetes 1st Degree Relative  (ICD-V18.0) 19)  Family History Breast Cancer 1st Degree Relative <50  (ICD-V16.3) 20)  Family History of Alcoholism/addiction  (ICD-V61.41) 21)  Peptic Ulcer Disease  (ICD-533.90) 22)  Gastrointestinal Hemorrhage, Hx of  (ICD-V12.79) 23)  COPD  (ICD-496)  Medications Prior to Update: 1)  Albuterol 90 Mcg/act Aers (Albuterol) .... Two Puffs Every Four Hours As Needed 2)  Advair Diskus 250-50 Mcg/dose Misc (Fluticasone-Salmeterol) .[name].. 1 Puff Two Times A Day 3)  Singulair 10 Mg Tabs (Montelukast Sodium) .... Take 1 Tablet By Mouth Once A Day 4)  Flagyl 500 Mg Tabs (Metronidazole) .... Take 1 Tablet By Mouth Two Times A Day For Seven Days. 5)  Hydrocortisone 1 % Crea (Hydrocortisone) .... Apply To The Affected Area 3-4 Times A Day 6)  Diphenhydramine Hcl 25 Mg Caps (Diphenhydramine Hcl) .[name].. 1 Tab As Needed For Itching Up  To Four Times A Day  Allergies (verified): 1)  ! Aspirin  Past History:  Past medical, surgical, family and social histories (including risk factors) reviewed, and no changes noted (except as noted below).  Past Medical History: Reviewed history from 11/27/2007 and no changes required. COPD 2/2 Tobacco use (need PFT's for verification) Gastrointestinal hemorrhage, hx of 2/2 PUD on July 22, 2006     requiring blood transfusion Peptic ulcer disease +H. pylori (antigen)- Dr. Juanda Chance       Treated with protonix 40 two times a day, Biaxin 600 two times a day x14, and Amox 1000mg  two times a       day for 14 days Etoh abuse:  as per patient stopped 2008-04-04after death of sister      no h/o withdrawl Tobacco abuse:  stopped as per patient Apr 04, 2008after death of sister  Past Surgical History: Reviewed history from 07/26/2006 and no changes required. Tubal ligation (remote) Jaw surgery 2/2 fx from trauma (remote)  Family History: Reviewed history from 07/26/2006 and no changes required. Family History of Alcoholism/Addiction Family History Diabetes 1st degree relative  Social History: Reviewed history from 01/24/2007 and no changes required. 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) Lives with mother Alcohol use-yes but stopped 04/04/2008Drug use-no Etoh:  h/o binge drinking no withdrawls but stopped 04-04-2008current smoker  Review of Systems  The patient denies anorexia, fever, weight loss, weight gain, vision loss, decreased hearing, hoarseness, chest pain, syncope, dyspnea on exertion, peripheral edema, prolonged cough, headaches, hemoptysis, abdominal pain, melena, hematochezia, severe indigestion/heartburn, hematuria, incontinence, genital sores, muscle weakness, suspicious skin lesions, transient blindness, difficulty walking, depression, unusual weight change, abnormal bleeding, enlarged lymph nodes, and breast masses.    Physical Exam  General:  alert,  well-developed, well-nourished, well-hydrated, and overweight-appearing.  alert, well-developed, well-nourished, well-hydrated, and overweight-appearing.   Head:  atraumatic and no abnormalities observed.  atraumatic and no abnormalities observed.   Eyes:  vision grossly intact, pupils equal, pupils round, pupils reactive to light, pupils react to accomodation, and corneas and lenses clear.  vision grossly intact, pupils equal, pupils round, pupils reactive to light, pupils react to accomodation, and corneas and lenses clear.   Ears:  R ear normal and L ear normal.  R ear normal and L ear normal.  Nose:  no external deformity.  no external deformity.   Mouth:  pharynx pink and moist, no erythema, no exudates, no posterior lymphoid hypertrophy, no lesions, no aphthous ulcers, no erosions, no tongue abnormalities, no leukoplakia, no petechiae, and teeth missing.  pharynx pink and moist, no erythema, no exudates, no posterior lymphoid hypertrophy, no lesions, no aphthous ulcers, no erosions, no tongue abnormalities, no leukoplakia, no petechiae, and teeth missing.   Neck:  supple and full ROM.; short and thick neck. supple and full ROM.   Lungs:  normal respiratory effort, no intercostal retractions, no accessory muscle use, normal breath sounds, no crackles, and no wheezes.  normal respiratory effort, no intercostal retractions, no accessory muscle use, normal breath sounds, no crackles, and no wheezes.   Heart:  Normal rate and regular rhythm. S1 and S2 normal without gallop, murmur, click, rub or other extra sounds. Abdomen:  soft, non-tender, normal bowel sounds, and no hepatomegaly.  soft, non-tender, normal bowel sounds, and no hepatomegaly.   Msk:  normal ROM, no joint tenderness, and no joint warmth.  normal ROM, no joint tenderness, and no joint warmth.  normal ROM, no joint tenderness, no joint warmth, and no redness over joints.   Pulses:  R and L carotid,radial,femoral,dorsalis pedis and  posterior tibial pulses are full and equal bilaterally Extremities:  No pedal edemabilatearlly. Neurologic:  alert & oriented X3, cranial nerves II-XII intact, and strength normal in all extremities.  decreased sensation to light touch to left chin.alert & oriented X3, cranial nerves II-XII intact, and strength normal in all extremities.   Skin:  Intact without suspicious lesions or rashes Cervical Nodes:  No lymphadenopathy noted Psych:  Cognition and judgment appear intact. Alert and cooperative with normal attention span and concentration. No apparent delusions, illusions, hallucinations   Impression & Recommendations:  Problem # 1:  ANGIOEDEMA (ICD-995.1) Assessment New Idiopathic angioedema vs complement defficiency vs hypothyroidism vs allergic ->unclear  etiollogy. will check C1inhibitor defficiency;C4; C-met.Will change from Benadryl to Zyrtec; plus Prednisone 6 days taper. Instructed to call 911 or go to ED immediately if feels worse. If all work up is negative->will refer Engineer, drilling. Orders: T-Drug Screen-Urine, (single) [phone]) T-TSH [phone]) T-Sed Rate (Automated) [phone]) T- * Misc. Laboratory test [phone]) T-CMP with Estimated GFR ([id number])  Problem # 2:  HYPOTENSION, ORTHOSTATIC (ICD-458.0) Assessment: New Unclear etiology. Patient does notexhibit any other S:S of an anaphylactic reaction. 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 Will recheck next visit.  Complete Medication List: 1)  Albuterol 90 Mcg/act Aers (Albuterol) .... Two puffs every four hours as needed 2)  Advair Diskus 250-50 Mcg/dose Misc (Fluticasone-salmeterol) .[name].. 1 puff two times a day 3)  Singulair 10 Mg Tabs (Montelukast sodium) .... Take 1 tablet by mouth once a day 4)  Hydrocortisone 1 % Crea (Hydrocortisone) .... Apply to the affected area 3-4 times a day 5)  Prednisone 20 Mg Tabs (Prednisone) .... Take 3 tab by mouth today,  2 tabs by mouth for 2 days, then 1 tab pox 2days, then stop 6)  Zyrtec Allergy 10 Mg Caps (Cetirizine hcl) .... Take one tab by mouth daily  Other Orders: T-CMP with estimated GFR ([id number]) T-CBC No Diff ([id number])  Patient Instructions: 1)  Please, call 911 or go to ER immediately,if you feel that your swellining is worse and/or short of breath, weezingor dizzy. 2)  Please, follow up with Dr. Cathey Endow in 2 weeks or sooner. Prescriptions: ZYRTEC ALLERGY 10 MG  CAPS (CETIRIZINE HCL) Take one tab by mouth daily  #30 x 11   Entered and Authorized by:   Deatra Robinson MD   Signed by:   Deatra Robinson MD on 12/16/2009   Method used:   Faxed to ...       [facility] Department (retail)       [address] [place], [place]  [address]       Ph: [phone]       Fax: [phone]   RxID:   [id number] PREDNISONE 20 MG TABS (PREDNISONE) Take 3 tab by mouth today, 2 tabs by mouth for 2 days, then 1 tab POx 2days, then stop  #42 x 0   Entered and Authorized by:   Deatra Robinson MD   Signed by:   Deatra Robinson MD on 12/16/2009   Method used:   Faxed to ...       [facility] Department (retail)       [address] [place], [place]  [address]       Ph: [phone]       Fax: [phone]   RxID:   [id number] ZYRTEC ALLERGY 10 MG CAPS (CETIRIZINE HCL) Take one tab by mouth daily  #30 x 11   Entered and Authorized by:   Deatra Robinson MD   Signed by:   Deatra Robinson MD on 12/16/2009   Method used:   Print then Give to Patient   RxID:   [id number] PREDNISONE 20 MG TABS (PREDNISONE) Take 3 tab by mouth today, 2 tabs by mouth for 2 days, then 1 tab POx 2days, then stop  #42 x 0   Entered and Authorized by:   Deatra Robinson MD   Signed by:   Deatra Robinson MD on 12/16/2009   Method used:   Print then Give to Patient   RxID:   [id number]   Prevention & Chronic Care Immunizations   Influenza vaccine: Fluvax Non-MCR   (06/06/2009)    Tetanus booster: Not documented   Td booster deferral: Deferred  (07/28/2009)    Pneumococcal vaccine: Pneumovax  (06/06/2009)  Colorectal Screening   Hemoccult: Not documented    Colonoscopy: DONE  (07/23/2009)   Colonoscopy due: 07/2019  Other Screening   Pap smear: NEGATIVE FOR INTRAEPITHELIAL LESIONS OR MALIGNANCY. BENIGN REACTIVE/REPARATIVE CHANGES.  (06/06/2009)    Mammogram: ASSESSMENT: Negative - BI-RADS 1^MM DIGITAL SCREENING  (06/11/2009)   Smoking status: quit  (12/16/2009)  Lipids   Total Cholesterol: 192  (06/09/2009)   LDL: 107  (06/09/2009)   LDL Direct: Not documented   HDL: 70  (06/09/2009)   Triglycerides: 73  (06/09/2009)  Process Orders Check Orders Results:     [facility]: Order checked:       NOT AUTHORIZED TO ORDER Tests Sent for requisitioning (December 16, 2009 2:51 PM):     12/16/2009: [facility] -- T-CBC No Diff [[id number]] (signed)     12/16/2009: [facility] -- T-Drug Screen-Urine, (single) [[id number]] (signed)     12/16/2009: [facility] -- T-TSH [phone] (signed)     12/16/2009: [facility] -- T-Sed Rate (Automated) [[id number]] (signed)     12/16/2009: [facility] -- T- * Misc. Laboratory test [id number] (signed)     12/16/2009: [facility] -- T-CMP with Estimated GFR [[id number]] (signed)     Process Orders Check Orders Results:     [facility]: Order  checked:       NOT AUTHORIZED TO ORDER Tests Sent for requisitioning (December 16, 2009 2:51 PM):     12/16/2009: [facility] -- T-CBC No Diff [[id number]] (signed)     12/16/2009: [facility] -- T-Drug Screen-Urine, (single) [[id number]] (signed)     12/16/2009: [facility] -- T-TSH [phone] (signed)     12/16/2009: [facility] -- T-Sed Rate (Automated) [[id number]]  (signed)     12/16/2009: [facility] -- T- * Misc. Laboratory test [phone] (signed)     12/16/2009: [facility] -- T-CMP with Estimated GFR [[id number]] (signed)

## 2010-05-21 NOTE — Progress Notes (Signed)
Summary: refill/ hla  Phone Note From Pharmacy   Summary of Call: pharm can only obtain 40mg  protonix  could we change from 20mg  to  40mg , please advise Initial call taken by: Marin Roberts RN,  January 23, 2010 5:36 PM  Follow-up for Phone Call        yes    New/Updated Medications: PROTONIX 40 MG SOLR (PANTOPRAZOLE SODIUM) one a day Prescriptions: PROTONIX 40 MG SOLR (PANTOPRAZOLE SODIUM) one a day  #30 x 6   Entered and Authorized by:   Zoila Shutter MD   Signed by:   Zoila Shutter MD on 01/26/2010   Method used:   Faxed to ...       [facility] Department (retail)       [address] [place], [place]  [address]       Ph: [phone]       Fax: [phone]   RxID:   [id number]

## 2010-05-21 NOTE — Assessment & Plan Note (Signed)
Summary: FU/SB.   Vital Signs:  Patient profile:   55 year old female Height:      61 inches (154.94 cm) Weight:      207.1 pounds (93.65 kg) BMI:     39.07 Temp:     97.5 degrees F (36.39 degrees C) oral Pulse rate:   67 / minute BP sitting:   142 / 99  (right arm) Cuff size:   regular  Vitals Entered By: Theotis Barrio NT II (March 04, 2010 1:40 PM) CC: PAIN ALL OVER / PRODUCTIVE COUGH FOR ABOUT 1 WEEK / RIGHT FACE-JAW AREA  Is Patient Diabetic? No Pain Assessment Patient in pain? yes     Location: ALL OVER Intensity:     6 Type: DULL ACHE Onset of pain  FOR ABOUT 1-2 WEEKS Nutritional Status BMI of > 30 = obese  Have you ever been in a relationship where you felt threatened, hurt or afraid?No   Does patient need assistance? Functional Status Self care Ambulation Normal   Primary Care Provider:  Zara Council MD  CC:  PAIN ALL OVER / PRODUCTIVE COUGH FOR ABOUT 1 WEEK / RIGHT FACE-JAW AREA .  History of Present Illness: 55 y/o woman with PMH significant for Asthma, Angioedema (unclear etiology)comes to the clinic today for a f/u visit.  She was in her usual state of health until 3 months ago when she started getting waxing and waning swelling of her face. She has had multiple visits for the similar complaint, was last seen on 10/31, in the interim she was fine, when again this friday( about 5 - 6 days ago) she noticed swelling over her face .  She says that on friday ,she first noticed this swelling over her tongue, then next day it was on her left lip and today its over her right  lip. She reports her symptoms are assocaited with chest tightness and SOB.  Denies using epipen uptil now though she felt the need today but took her meds which helped her.  Denies fever/N/V/rash/abdominal pain.   Preventive Screening-Counseling & Management  Alcohol-Tobacco     Alcohol type: BEER on the w/e     Smoking Status: current     Smoking Cessation Counseling: yes  Packs/Day: 1.0 cig every 2-3 days     Year Quit: Feb 2011  Caffeine-Diet-Exercise     Caffeine use/day: 0     Does Patient Exercise: yes     Type of exercise: WALKING     Exercise (avg: min/session): 1 HOUR     Times/week:     5  Current Medications (verified): 1)  Albuterol 90 Mcg/act Aers (Albuterol) .... Two Puffs Every Four Hours As Needed 2)  Singulair 10 Mg Tabs (Montelukast Sodium) .... Take 1 Tablet By Mouth Once A Day 3)  Hydrocortisone 1 % Crea (Hydrocortisone) .... Apply To The Affected Area 3-4 Times A Day 4)  Zyrtec Allergy 10 Mg Caps (Cetirizine Hcl) .... Take One Tab By Mouth Daily 5)  Protonix 40 Mg Solr (Pantoprazole Sodium) .... One A Day 6)  Zantac 150 Mg Tabs (Ranitidine Hcl) .... Take 1 Tablet By Mouth Two Times A Day 7)  Epipen 0.3 Mg/0.29ml Devi (Epinephrine) .... Use As Directed 8)  Prednisone 10 Mg Tabs (Prednisone) .... Take 6 Tablets By Mouth Then Taper Down By One Tablet Until Done With Medicaiton 9)  Prednisone 20 Mg Tabs (Prednisone) .... Take 2 Tablets For 2 Days, 1 Tablet For 2 Days , 1/2 Tablet For 2 Days  and Then Stop. 10)  Diphenhydramine Hcl 25 Mg Tabs (Diphenhydramine Hcl) .... Take 1 Tab Very 4 Hours As Needed For Swelling  Allergies (verified): 1)  ! Aspirin  Review of Systems      See HPI  The patient denies anorexia, fever, decreased hearing, hoarseness, syncope, peripheral edema, and headaches.    Physical Exam  General:  alert, in no acute distress, swelling over her right lip. Head:  normocephalic, atraumatic, and no abnormalities observed.  normocephalic, atraumatic, and no abnormalities observed.   Eyes:  vision grossly intact.  vision grossly intact.   Mouth:  swollen right lip, pharynx pink and moist.   Neck:  supple, full ROM, and no masses.   Lungs:  normal respiratory effort, no intercostal retractions, no accessory muscle use, normal breath sounds, no dullness, no fremitus,mild expiratory wheezes, and no crackles.   Heart:   normal rate, regular rhythm, no murmur, no gallop, no rub, and no JVD.   Abdomen:  soft, non-tender, normal bowel sounds, no distention, no masses, no guarding, and no rigidity.   Msk:  normal ROM, no joint tenderness, no joint swelling, and no joint warmth.   Pulses:  2+pulses b/l. Extremities:  no cyanosis, no clubbing , no edema. Neurologic:  alert & oriented X3, cranial nerves II-XII intact, strength normal in all extremities, sensation intact to light touch, sensation intact to pinprick, and gait normal.     Impression & Recommendations:  Problem # 1:  ANGIOEDEMA (ICD-995.1) Assessment Unchanged She presents with swollen right lip today,  also complains of assocaited chest tightness and SOB. The cause for her angioedema is unclear. Will check ESR, CRP, C3 and C4 today. She was given 60 mg of prednione in the clinic . Will do prednisone taper over 7 days. She reports that she takes zantac once daily but she is suppose to take it twice daily, was reinforced to take her zantac twice daily.  Adviced to call 911 if her symptoms get worse.Instructed her again how to use epipen. We tried to reschedule her appointment  with the Allergy specialist earlier than 04/07/10 in [facility], but were not able to do so today. Will update the patient if her appointment gets rescheduled. Orders: T-C-Reactive Protein [phone]) T-Sed Rate (Automated) [phone]) T- * Misc. Laboratory test [phone])  Problem # 2:  ASTHMA (ICD-493.90) She complained of some chest tightness with SOB. She had mild exp wheezes. She is taking albuterl every 4 hours .All her symptoms could be interrelated. At once the cause for angioedema was thought to be advair and she is not taking it anymore. Continue the same treatment for now. She was adviced to return to the clinic if symptoms get worse. The following medications were removed from the medication list:    Prednisone 10 Mg Tabs (Prednisone) .[name]... Take 6 tablets by mouth  then taper down by one tablet until done with medicaiton Her updated medication list for this problem includes:    Albuterol 90 Mcg/act Aers (Albuterol) .[name]..[name] Two puffs every four hours as needed    Singulair 10 Mg Tabs (Montelukast sodium) .[name]... Take 1 tablet by mouth once a day    Prednisone 20 Mg Tabs (Prednisone) .[name]... Take 2 tablets for 2 days, 1 tablet for 2 days , 1/2 tablet for 2 days and then stop.  Complete Medication List: 1)  Albuterol 90 Mcg/act Aers (Albuterol) .... Two puffs every four hours as needed 2)  Singulair 10 Mg Tabs (Montelukast sodium) .... Take 1 tablet by  mouth once a day 3)  Hydrocortisone 1 % Crea (Hydrocortisone) .... Apply to the affected area 3-4 times a day 4)  Zyrtec Allergy 10 Mg Caps (Cetirizine hcl) .... Take one tab by mouth daily 5)  Protonix 40 Mg Solr (Pantoprazole sodium) .... One a day 6)  Zantac 150 Mg Tabs (Ranitidine hcl) .... Take 1 tablet by mouth two times a day 7)  Epipen 0.3 Mg/0.63ml Devi (Epinephrine) .... Use as directed 8)  Prednisone 20 Mg Tabs (Prednisone) .... Take 2 tablets for 2 days, 1 tablet for 2 days , 1/2 tablet for 2 days and then stop. 9)  Diphenhydramine Hcl 25 Mg Tabs (Diphenhydramine hcl) .... Take 1 tab very 4 hours as needed for swelling  Other Orders: Prednisone 20 mg tab [facility])  Patient Instructions: 1)  Please schedule a f/u appointment in 1 week. 2)  Please take the medicines as prescribed. 3)  Please call 911 if your symptoms get worse. Prescriptions: DIPHENHYDRAMINE HCL 25 MG TABS (DIPHENHYDRAMINE HCL) Take 1 tab very 4 hours as needed for swelling  #40 x 0   Entered and Authorized by:   Elyse Jarvis   Signed by:   Elyse Jarvis on 03/04/2010   Method used:   Print then Give to Patient   RxID:   [id number] PREDNISONE 20 MG TABS (PREDNISONE) Take 2 tablets for 2 days, 1 tablet for 2 days , 1/2 tablet for 2 days and then stop.  #7 x 0   Entered and Authorized by:   Elyse Jarvis   Signed by:   Elyse Jarvis on 03/04/2010   Method used:   Print then Give to Patient   RxID:   [id number] ZANTAC 150 MG TABS (RANITIDINE HCL) Take 1 tablet by mouth two times a day  #60 x 1   Entered and Authorized by:   Elyse Jarvis   Signed by:   Elyse Jarvis on 03/04/2010   Method used:   Print then Give to Patient   RxID:   [id number]    Medication Administration  Injection # 1:    Diagnosis:    Medication # 1:    Medication: Prednisone 20 mg tab    Diagnosis: ALLERGIC REACTION (ICD-995.3)    Dose: 60mg     Route: po    Exp Date: 08/2011    Lot #: [id number]    Mfr: Roxanne    Comments: Total of 60mg  given.    Patient tolerated medication without complications    Given by: Chinita Pester RN (March 04, 2010 3:33 PM)  Orders Added: 1)  T-C-Reactive Protein [[id number]] 2)  T-Sed Rate (Automated) [[id number]] 3)  T- * Misc. Laboratory test [99999] 4)  Prednisone 20 mg tab [EMRORAL] 5)  Est. Patient Level IV [29518]   Process Orders Check Orders Results:     [facility]: ABN not required for this insurance Tests Sent for requisitioning (March 04, 2010 7:39 PM):     03/04/2010: [facility] -- T-C-Reactive Protein [[id number]] (signed)     03/04/2010: [facility] -- T-Sed Rate (Automated) [[id number]] (signed)     03/04/2010: [facility] -- T- * Misc. Laboratory test [phone] (signed)     Prevention & Chronic Care Immunizations   Influenza vaccine: Fluvax Non-MCR  (12/24/2009)    Tetanus booster: Not documented   Td booster deferral: Deferred  (07/28/2009)    Pneumococcal vaccine: Pneumovax  (06/06/2009)  Colorectal Screening   Hemoccult: Not documented    Colonoscopy: DONE  (07/23/2009)  Colonoscopy due: 07/2019  Other Screening   Pap smear: NEGATIVE FOR INTRAEPITHELIAL LESIONS OR MALIGNANCY. BENIGN REACTIVE/REPARATIVE CHANGES.  (06/06/2009)    Mammogram: ASSESSMENT: Negative - BI-RADS 1^MM  DIGITAL SCREENING  (06/11/2009)   Smoking status: current  (03/04/2010)   Smoking cessation counseling: yes  (03/04/2010)  Lipids   Total Cholesterol: 192  (06/09/2009)   LDL: 107  (06/09/2009)   LDL Direct: Not documented   HDL: 70  (06/09/2009)   Triglycerides: 73  (06/09/2009)    Medication Administration  Injection # 1:    Diagnosis:    Medication # 1:    Medication: Prednisone 20 mg tab    Diagnosis: ALLERGIC REACTION (ICD-995.3)    Dose: 60mg     Route: po    Exp Date: 08/2011    Lot #: [id number]    Mfr: Roxanne    Comments: Total of 60mg  given.    Patient tolerated medication without complications    Given by: Chinita Pester RN (March 04, 2010 3:33 PM)  Orders Added: 1)  T-C-Reactive Protein [[id number]] 2)  T-Sed Rate (Automated) [[id number]] 3)  T- * Misc. Laboratory test [99999] 4)  Prednisone 20 mg tab [EMRORAL] 5)  Est. Patient Level IV [84696]

## 2010-05-21 NOTE — Progress Notes (Signed)
Summary: phone/gg    Dr Aundria Rud  Phone Note Call from Patient   Summary of Call: Pt walked in asking for sample of advair 250/50.  [facility] does not have any at this time. She was seen at [facility] yesterday for wheezing.  treated with pred and albuterol. Will you give her a sample?  Advair 250/50   Lot # [id number] Ex 10/2010 Initial call taken by: Merrie Roof RN,  October 23, 2009 4:20 PM  Follow-up for Phone Call        OK Follow-up by: Ulyess Mort MD,  October 23, 2009 5:52 PM

## 2010-05-21 NOTE — Procedures (Signed)
Summary: Colonoscopy  Patient: Ori Kreiter Note: All result statuses are Final unless otherwise noted.  Tests: (1) Colonoscopy (COL)   COL Colonoscopy           DONE     [facility]     [address]     [place], [place]  [address]           COLONOSCOPY PROCEDURE REPORT           PATIENT:  Ajane, Novella  MR#:  [id number]     BIRTHDATE:  1956-03-11, 54 yrs. old  GENDER:  female     ENDOSCOPIST:  Vania Rea. Jarold Motto, MD, [facility]     REF. BY:     PROCEDURE DATE:  07/23/2009     PROCEDURE:  Average-risk screening colonoscopy     G0121     ASA CLASS:  Class II     INDICATIONS:  Routine Risk Screening     MEDICATIONS:   Fentanyl 50 mcg IV, Versed 5 mg IV           DESCRIPTION OF PROCEDURE:   After the risks benefits and     alternatives of the procedure were thoroughly explained, informed     consent was obtained.  Digital rectal exam was performed and     revealed no abnormalities.   The LB [id number] endoscope     was introduced through the anus and advanced to the cecum, which     was identified by both the appendix and ileocecal valve, without     limitations.  The quality of the prep was excellent, using     MoviPrep.  The instrument was then slowly withdrawn as the colon     was fully examined.     <<PROCEDUREIMAGES>>           FINDINGS:  No polyps or cancers were seen.  This was otherwise a     normal examination of the colon.   Retroflexed views in the rectum     revealed it was not tolerated by the patient.    The scope was     then withdrawn from the patient and the procedure completed.           COMPLICATIONS:  None     ENDOSCOPIC IMPRESSION:     1) No polyps or cancers     2) Otherwise normal examination     RECOMMENDATIONS:     1) Continue current colorectal screening recommendations for     "routine risk" patients with a repeat colonoscopy in 10 years.     REPEAT EXAM:  No           ______________________________     Vania Rea.  Jarold Motto, MD, [facility]           CC:           n.     eSIGNED:   Vania Rea. Chloeanne Poteet at 07/23/2009 09:35 AM           Romeo Rabon, [id number]  Note: An exclamation mark (!) indicates a result that was not dispersed into the flowsheet. Document Creation Date: 07/23/2009 9:36 AM _______________________________________________________________________  (1) Order result status: Final Collection or observation date-time: 07/23/2009 09:33 Requested date-time:  Receipt date-time:  Reported date-time:  Referring Physician:   Ordering Physician: Sheryn Bison [phone]) Specimen Source:  Source: Launa Grill Order Number: [id number] Lab site:   Appended Document: Colonoscopy    Clinical Lists Changes  Observations: Added new observation of  COLONNXTDUE: 07/2019 (07/23/2009 14:54)

## 2010-05-21 NOTE — Progress Notes (Signed)
Summary: refill/gg  Phone Note Refill Request  on February 02, 2010 11:58 AM  Refills Requested: Medication #1:  SINGULAIR 10 MG TABS Take 1 tablet by mouth once a day  Medication #2:  ALBUTEROL 90 MCG/ACT AERS two puffs every four hours as needed   Last Refilled: 10/22/2009  Method Requested: Fax to Local Pharmacy Initial call taken by: Merrie Roof RN,  February 02, 2010 11:59 AM  Follow-up for Phone Call       Follow-up by: Blanch Media MD,  February 02, 2010 12:01 PM    Prescriptions: ALBUTEROL 90 MCG/ACT AERS (ALBUTEROL) two puffs every four hours as needed  #1 MDI x 5   Entered and Authorized by:   Blanch Media MD   Signed by:   Blanch Media MD on 02/02/2010   Method used:   Faxed to ...       [facility] Department (retail)       [address] [place], [place]  [address]       Ph: [phone]       Fax: [phone]   RxID:   [id number] SINGULAIR 10 MG TABS (MONTELUKAST SODIUM) Take 1 tablet by mouth once a day  #30 x 5   Entered and Authorized by:   Blanch Media MD   Signed by:   Blanch Media MD on 02/02/2010   Method used:   Faxed to ...       [facility] Department (retail)       [address] [place], [place]  [address]       Ph: [phone]       Fax: [phone]   RxID:   [id number]

## 2010-05-21 NOTE — Progress Notes (Signed)
Summary: medication - Flovent/gp  Phone Note Outgoing Call   Summary of Call: Pt. states if Flovent is not on the $4.00 list; she cannot afford it.  [facility] on [address] states it s not on the $4.00 list.  Ventolin cost $42.00 and Proair $44.00.  I will call [facility] pharmacy tomorrow to find out when Flovent will be available. Initial call taken by: Chinita Pester RN,  March 19, 2010 5:50 PM  Follow-up for Phone Call        [facility] pharmacy states it will take 6 - 8 weeks before they will have Flovent. Follow-up by: Chinita Pester RN,  March 20, 2010 2:05 PM  Additional Follow-up for Phone Call Additional follow up Details #1::        We have a sample of Flovent 44 micrograms, and I added this to med list so we can dispense the sample today.  She has follow-up next week with Dr. Cathey Endow; he could consider restarting Advair since [facility] med in stock. Additional Follow-up by: Margarito Liner MD,  March 20, 2010 3:59 PM    Additional Follow-up for Phone Call Additional follow up Details #2::    Pt. was called about sample but no answer. Follow-up by: Chinita Pester RN,  March 20, 2010 5:03 PM  Additional Follow-up for Phone Call Additional follow up Details #3:: Details for Additional Follow-up Action Taken: It would be better if pt did not restart advair at this time as we are concerned that it may be causing some of her facial swelling.  Please attempt to recall pt to pick up sample.   Additional Follow-up by: Sinda Du MD,  March 25, 2010 12:30 PM  New/Updated Medications: FLOVENT HFA 44 MCG/ACT AERO (FLUTICASONE PROPIONATE  HFA) Inhale 2 puffs two times a day   Pt. was called; she will come today to pick up sample.   Chinita Pester RN  March 25, 2010 1:42 PM  Sample given to pt.by Dr. Cathey Endow. Chinita Pester RN  March 25, 2010 3:38 PM

## 2010-05-21 NOTE — Progress Notes (Signed)
Summary: Appointment   Phone Note Outgoing Call   Call placed by: Angelina Ok RN,  February 25, 2010 2:58 PM Call placed to: Patient Summary of Call: Call to pt to see if she will be willing to see an Allergist in [facility].  Pt has no insurance and will have to pay out of pocket.  Pt agreed to see if an appointment can be scheduled at [facility].  Pt was scheduled for an appointment at [facility] RN  February 25, 2010 3:10 PM

## 2010-05-21 NOTE — Assessment & Plan Note (Signed)
Summary: acute-ed f/u allergic reaction needs ref/ per dr Felipa Emory...   Vital Signs:  Patient profile:   55 year old female Height:      61 inches (154.94 cm) Weight:      207.9 pounds (94.50 kg) BMI:     39.42 Temp:     98.3 degrees F (36.83 degrees C) oral BP sitting:   108 / 76  (right arm) Cuff size:   regular  Vitals Entered By: Chinita Pester RN (February 02, 2010 11:45 AM) CC: ?allergic reaction - right side of face swollen, tingling in bottom lip. About 2 weeks ago went to ED b/c swollen tongue. Is Patient Diabetic? No Pain Assessment Patient in pain? no      Nutritional Status BMI of > 30 = obese  Have you ever been in a relationship where you felt threatened, hurt or afraid?No   Does patient need assistance? Functional Status Self care Ambulation Normal   Primary Care Provider:  Zara Council MD  CC:  ?allergic reaction - right side of face swollen and tingling in bottom lip. About 2 weeks ago went to ED b/c swollen tongue.[name]  History of Present Illness: 55 yr old woman with pmhx as described below comes to the clinic complaining of swelling of the face since a month ago. Swelling waxes and wanes. Last for about one day and then resolves. Patient has never had these problems before. Denies shortness of breath, laryngeal swelling, fever or chills.   Depression History:      The patient denies a depressed mood most of the day and a diminished interest in her usual daily activities.         Preventive Screening-Counseling & Management  Alcohol-Tobacco     Alcohol type: BEER on the w/e     Smoking Status: current     Smoking Cessation Counseling: yes     Packs/Day: 1.0 cig every 2-3 days     Year Quit: Feb 2011  Caffeine-Diet-Exercise     Caffeine use/day: 0     Does Patient Exercise: yes     Type of exercise: WALKING     Exercise (avg: min/session): 1 HOUR     Times/week:     5  Problems Prior to Update: 1)  Nevus, Non-neoplastic  (ICD-448.1) 2)  Gerd   (ICD-530.81) 3)  Hypotension, Orthostatic  (ICD-458.0) 4)  Angioedema  (ICD-995.1) 5)  Vaginal Discharge  (ICD-623.5) 6)  Preventive Health Care  (ICD-V70.0) 7)  Intertrigo, Candidal  (ICD-695.89) 8)  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) 9)  Periapical Abscess Without Sinus  (ICD-522.5) 10)  Dental Pain  (ICD-525.9) 11)  Chronic Gingivitis Plaque Induced  (ICD-523.10) 12)  Pain in Joint, Forearm  (ICD-719.43) 13)  Stress Incontinence  (ICD-788.39) 14)  Tobacco Abuse  (ICD-305.1) 15)  Asthma  (ICD-493.90) 16)  Chronic Obstructive Pulmonary Disease, Acute Exacerbation  (ICD-491.21) 17)  Family History Diabetes 1st Degree Relative  (ICD-V18.0) 18)  Family History Breast Cancer 1st Degree Relative <50  (ICD-V16.3) 19)  Family History of Alcoholism/addiction  (ICD-V61.41) 20)  Peptic Ulcer Disease  (ICD-533.90) 21)  Gastrointestinal Hemorrhage, Hx of  (ICD-V12.79) 22)  COPD  (ICD-496)  Medications Prior to Update: 1)  Albuterol 90 Mcg/act Aers (Albuterol) .... Two Puffs Every Four Hours As Needed 2)  Advair Diskus 250-50 Mcg/dose Misc (Fluticasone-Salmeterol) .[name].. 1 Puff Two Times A Day 3)  Singulair 10 Mg Tabs (Montelukast Sodium) .... Take 1 Tablet By Mouth Once A Day 4)  Hydrocortisone 1 % Crea (  Hydrocortisone) .... Apply To The Affected Area 3-4 Times A Day 5)  Zyrtec Allergy 10 Mg Caps (Cetirizine Hcl) .... Take One Tab By Mouth Daily 6)  Protonix 40 Mg Solr (Pantoprazole Sodium) .... One A Day  Current Medications (verified): 1)  Albuterol 90 Mcg/act Aers (Albuterol) .... Two Puffs Every Four Hours As Needed 2)  Singulair 10 Mg Tabs (Montelukast Sodium) .... Take 1 Tablet By Mouth Once A Day 3)  Hydrocortisone 1 % Crea (Hydrocortisone) .... Apply To The Affected Area 3-4 Times A Day 4)  Zyrtec Allergy 10 Mg Caps (Cetirizine Hcl) .... Take One Tab By Mouth Daily 5)  Protonix 40 Mg Solr (Pantoprazole Sodium) .... One A Day 6)  Zantac 150 Mg Tabs (Ranitidine Hcl) .... Take 1  Tablet By Mouth Two Times A Day  Allergies: 1)  ! 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 pylori (antigen)- Dr. Juanda Chance       Treated with protonix 40 two times a day, Biaxin 600 two times a day x14, and Amox 1000mg  two times a       day for 14 days Etoh abuse:  as per patient stopped 04-09-08after death of sister      no h/o withdrawl Tobacco abuse:  stopped as per patient 2008/04/09after death of sister  Past Surgical History: Last updated: 07/26/2006 Tubal ligation (remote) Jaw surgery 2/2 fx from trauma (remote)  Family History: Last updated: 07/26/2006 Family History of Alcoholism/Addiction Family History Diabetes 1st degree relative  Social History: Last updated: 12/24/2009 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  current smoker  Risk Factors: Caffeine Use: 0 (02/02/2010) Exercise: yes (02/02/2010)  Risk Factors: Smoking Status: current (02/02/2010) Packs/Day: 1.0 cig every 2-3 days (02/02/2010)  Family History: Reviewed history from 07/26/2006 and no changes required. Family History of Alcoholism/Addiction Family History Diabetes 1st degree relative  Social History: Reviewed history from 12/24/2009 and no changes required. 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  current smokerSmoking Status:  current Packs/Day:  1.0 cig every 2-3 days  Review of Systems  The patient denies  fever, chest pain, dyspnea on exertion, hemoptysis, abdominal pain, melena, hematochezia, hematuria, and difficulty walking.    Physical Exam  General:  NAD Mouth:  mild swelling to lower lip, no erythema, no exudates, no posterior lymphoid hypertrophy, no pharyngeal crowing, and no tongue abnormalities.   Neck:  supple, full ROM, no masses, and no thyroid nodules or tenderness.   Lungs:  normal respiratory effort, normal breath sounds, no crackles, and no wheezes.   Heart:  normal rate, regular rhythm, no murmur, no gallop, and no rub.   Abdomen:  soft, non-tender, normal bowel sounds, no distention, and no masses.   Msk:  normal ROM.   Extremities:  no edema Neurologic:  alert & oriented X3 and cranial nerves II-XII intact.     Impression & Recommendations:  Problem # 1:  ANGIOEDEMA (ICD-995.1) Most likely the cause of  patient's symptoms which are thought to be due to Advair. Patient has mild persistent asthma was will discontinue Advair and control symptoms with albuterol. Patient was also instructed to completed diet dairy for the next two weeks. Will prescribe H2 blocker and epi pen to use in case of laryngeal edema. If patient continues to have swelling on follow up will review diet dairy and consider referring to [facility].  Problem # 2:  ASTHMA (ICD-493.90) Patient instructed to return to clinic if she develops shortness of breath that is not well controlled on current regimen.   The following medications were removed from the medication list:    Advair Diskus 250-50 Mcg/dose Misc (Fluticasone-salmeterol) .[name]... 1 puff two times a day Her updated medication list for this problem includes:    Albuterol 90 Mcg/act Aers (Albuterol) .[name]..[name] Two puffs every four hours as needed    Singulair 10 Mg Tabs (Montelukast sodium) .[name]... Take 1 tablet by mouth once a day  Complete Medication List: 1)  Albuterol 90 Mcg/act Aers (Albuterol) .... Two puffs every four hours as needed 2)   Singulair 10 Mg Tabs (Montelukast sodium) .... Take 1 tablet by mouth once a day 3)  Hydrocortisone 1 % Crea (Hydrocortisone) .... Apply to the affected area 3-4 times a day 4)  Zyrtec Allergy 10 Mg Caps (Cetirizine hcl) .... Take one tab by mouth daily 5)  Protonix 40 Mg Solr (Pantoprazole sodium) .... One a day 6)  Zantac 150 Mg Tabs (Ranitidine hcl) .... Take 1 tablet by mouth two times a day 7)  Epipen 0.3 Mg/0.63ml Devi (Epinephrine) .... Use as directed  Patient Instructions: 1)  Please schedule a follow-up appointment in 2 weeks. 2)  Stop taking Advair. 3)  Do a dietary dairy for the next 2 weeks. Prescriptions: EPIPEN 0.3 MG/0.3ML DEVI (EPINEPHRINE) Use as directed  #1 x 3   Entered and Authorized by:   Laren Everts MD   Signed by:   Laren Everts MD on 02/02/2010   Method used:   Faxed to ...       [facility] Department (retail)       [address] [place], [place]  [address]       Ph: [phone]       Fax: [phone]   RxID:   [id number] ZANTAC 150 MG TABS (RANITIDINE HCL) Take 1 tablet by mouth two times a day  #60 x 1   Entered and Authorized by:   Laren Everts MD   Signed by:   Laren Everts MD on 02/02/2010   Method used:   Faxed to ...       [facility] Department (retail)       [address] [place], [place]  [address]       Ph: [phone]       Fax: [phone]   RxID:   [id number]    Orders Added: 1)  Est. Patient Level III [06301]    Prevention & Chronic Care Immunizations   Influenza vaccine: Fluvax Non-MCR  (12/24/2009)    Tetanus booster: Not documented   Td booster deferral: Deferred  (07/28/2009)    Pneumococcal vaccine: Pneumovax  (06/06/2009)  Colorectal Screening   Hemoccult: Not documented    Colonoscopy: DONE  (07/23/2009)   Colonoscopy due: 07/2019  Other Screening   Pap smear: NEGATIVE FOR INTRAEPITHELIAL LESIONS OR MALIGNANCY. BENIGN  REACTIVE/REPARATIVE CHANGES.  (06/06/2009)    Mammogram:  ASSESSMENT: Negative - BI-RADS 1^MM DIGITAL SCREENING  (06/11/2009)   Smoking status: current  (02/02/2010)   Smoking cessation counseling: yes  (02/02/2010)  Lipids   Total Cholesterol: 192  (06/09/2009)   LDL: 107  (06/09/2009)   LDL Direct: Not documented   HDL: 70  (06/09/2009)   Triglycerides: 73  (06/09/2009)

## 2010-05-21 NOTE — Letter (Signed)
Summary: [facility] EMERGENCY VISIT    EMERGENCY VISIT   Imported By: Margie Billet 07/16/2009 11:03:45  _____________________________________________________________________  External Attachment:    Type:   Image     Comment:   External Document

## 2010-05-21 NOTE — Progress Notes (Signed)
Summary: walk-in/hla  Phone Note Call from Patient   Summary of Call: pt presents c/o rash since saturday on L chest, both arms, both legs, poss. back, itches especially at night, does change body soaps and laundry detergents often...depending what's on sale...denies any/ all other problems but is very uncomfortable due to itching. added to pm schedule at 1430 w/ dr Flavio Lindroth Initial call taken by: Marin Roberts RN,  July 08, 2009 12:06 PM  Follow-up for Phone Call        Agree with appt today with Dr Polly Cobia Follow-up by: Blanch Media MD,  July 08, 2009 2:41 PM  Additional Follow-up for Phone Call Additional follow up Details #1::        I will review the patient in the clinic.  Additional Follow-up by: Zara Council MD,  July 08, 2009 3:05 PM

## 2010-05-21 NOTE — Progress Notes (Signed)
Summary: refill/gg  Phone Note Refill Request  on June 11, 2009 12:33 PM  Refills Requested: Medication #1:  ADVAIR DISKUS 250-50 MCG/DOSE MISC 1 puff two times a day  Medication #2:  SINGULAIR 10 MG TABS Take 1 tablet by mouth once a day  Method Requested: Telephone to Pharmacy Initial call taken by: Merrie Roof RN,  June 11, 2009 12:33 PM    Prescriptions: ADVAIR DISKUS 250-50 MCG/DOSE MISC (FLUTICASONE-SALMETEROL) 1 puff two times a day  #1 mo. supply x 6   Entered and Authorized by:   Zara Council MD   Signed by:   Zara Council MD on 06/12/2009   Method used:   Faxed to ...       [facility] Department (retail)       [address] [place], [place]  [address]       Ph: [phone]       Fax: [phone]   RxID:   [id number] SINGULAIR 10 MG TABS (MONTELUKAST SODIUM) Take 1 tablet by mouth once a day  #1 mo. supply x 6   Entered and Authorized by:   Zara Council MD   Signed by:   Zara Council MD on 06/12/2009   Method used:   Faxed to ...       [facility] Department (retail)       [address] [place], [place]  [address]       Ph: [phone]       Fax: [phone]   RxID:   [id number]

## 2010-05-21 NOTE — Assessment & Plan Note (Signed)
Summary: EST-1 WEEK RECHECK/CH   Vital Signs:  Patient profile:   55 year old female Height:      61 inches (154.94 cm) Weight:      207.8 pounds (94.45 kg) BMI:     39.41 Temp:     98.9 degrees F (37.17 degrees C) oral Pulse rate:   73 / minute BP sitting:   101 / 72  (right arm) Cuff size:   large  Vitals Entered By: Theotis Barrio NT II (March 11, 2010 2:41 PM) CC: FOLLOW UP APPT/ SWELLING OF FACE Is Patient Diabetic? No Pain Assessment Patient in pain? no      Nutritional Status BMI of > 30 = obese  Have you ever been in a relationship where you felt threatened, hurt or afraid?No   Does patient need assistance? Functional Status Self care Ambulation Normal   Primary Care Provider:  Zara Council MD  CC:  FOLLOW UP APPT/ SWELLING OF FACE.  History of Present Illness: This is a 55 year old female recently seen by Dr. Dorthula Rue for angioedema who also has a hx of GERD and asthma/COPD .  At her last visit (11/16), pt presented for a 3 month hx of intermittant facial edema which acutely worsened 5 days before the visit. Sed rate, and compliment levels were normal while CRP was mildly elevated at 0.7.  Pt was started on a 7 day prednisone taper and told to use zantac twice daily.  In the mean time, pt has purchased an epi pin in case of emergencies and has set up an appointment with an allergy specialist at [facility] for Dec 20th.  Since her last visit, pt has had complete resolution of her facial edema and denies any further episodes of swelling.  Although pts edema has always been facial and has even excluded mild swelling of her tongue she denies ever having any trouble breathing or swallowing. At this point,  pt is off her prednisone taper and continues to take her zyrtec as prescribed. Pt has been unable to determine any triggers for her symptoms though she was told by urgent care to DC her Advair out of concern that it may be causing her symptoms.  Pt has not noted any  associations with food.   With regards to the patient's asthma, she feels that it is under good control at this point.  Pt denies any SOB and is not having any night time awakenings.  Pt also denies cough and chest tightness which she has had in the past.  Pt has recently purchased a humidifier which is improving her symptoms and she is using her albuterol MDI every 4 hours.   Pt denies any GERD symptoms as long as she is faithfully taking her medications.  She also denies any fever, chills, rashes, or change in her BM's.    Preventive Screening-Counseling & Management  Alcohol-Tobacco     Alcohol type: BEER on the w/e     Smoking Status: current     Smoking Cessation Counseling: yes     Packs/Day: 1.0 cig every 2-3 days     Year Quit: Feb 2011  Caffeine-Diet-Exercise     Caffeine use/day: 0     Does Patient Exercise: yes     Type of exercise: WALKING     Exercise (avg: min/session): 1 HOUR     Times/week:     5  Allergies: 1)  ! Aspirin  Past History:  Past Medical History: Last updated: 11/27/2007 COPD 2/2 Tobacco  use (need PFT's for verification) Gastrointestinal hemorrhage, hx of 2/2 PUD on 07-19-06     requiring blood transfusion Peptic ulcer disease +H. pylori (antigen)- Dr. Juanda Chance       Treated with protonix 40 two times a day, Biaxin 600 two times a day x14, and Amox 1000mg  two times a       day for 14 days Etoh abuse:  as per patient stopped 04/01/2008after death of sister      no h/o withdrawl Tobacco abuse:  stopped as per patient April 01, 2008after death of sister  Family History: Reviewed history from 07/26/2006 and no changes required. Family History of Alcoholism/Addiction Family History Diabetes 1st degree relative  Social History: Reviewed history from 12/24/2009 and no changes required. 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  current smoker  Review of Systems       Negative as per HPI.   Physical Exam  General:  alert and well-developed.   Head:  normocephalic and atraumatic. no edema Eyes:  vision grossly intact, pupils equal, pupils round, and pupils reactive to light..   Mouth:  pharynx pink and moist.   Neck:  supple.  Lungs:  normal respiratory effort, normal breath sounds, no crackles, and no wheezes.  Heart:  normal rate, regular rhythm, no murmur, no gallop, and no rub.   Abdomen:  soft, non-tender, normal bowel sounds, no distention, and no masses.     Pulses:  2+ dp/pt pulses bilaterally.  Neurologic:  cranial nerves II-XII intact.  cranial nerves II-XII intact.   Skin:  No rashes, edmea, erythema.    Impression & Recommendations:  Problem # 1:  ANGIOEDEMA (ICD-995.1) Resolved at this point.  Pt continues to closely monitor for triggers and has both an epi pin and benadryl available incase they are needed.  Pt will follow up with an allergist at [facility] on Dec 20th but will contact us immediately if she develops another episode of edema so we can try to get her a closer appointment. Pt should continue taking her zyrtec daily at this point.  Problem # 2:  ASTHMA (ICD-493.90) Under reasonable control at this point. Given possibility that advair was worsening the pts edema I would like her to see her allergist prior to considering restarting her advair.  I will Fu with pt in early January and consider restarting it at that point depending on the severity of her asthma.  Once again pt was encouraged to avoid triggers like smoke.   see in begiing of year to add back advair.  Her updated medication list for this problem includes:    Albuterol 90 Mcg/act Aers (Albuterol) .[name]..[name] Two puffs every four hours as needed    Singulair 10 Mg Tabs (Montelukast sodium) .[name]... Take 1 tablet by mouth once a day    Prednisone 20 Mg Tabs (Prednisone)  .[name]... Take 2 tablets for 2 days, 1 tablet for 2 days , 1/2 tablet for 2 days and then stop.  Problem # 3:  GERD (ICD-530.81) Stable, pt encouraged to take her pepcid daily to avoid future flares and possible worsening of her asthma.   Her updated medication list for this problem includes:    Pepcid 20 Mg Tabs (Famotidine) .[name]... Take 1 tablet by mouth two times a day  Complete Medication List:  1)  Albuterol 90 Mcg/act Aers (Albuterol) .... Two puffs every four hours as needed 2)  Singulair 10 Mg Tabs (Montelukast sodium) .... Take 1 tablet by mouth once a day 3)  Hydrocortisone 1 % Crea (Hydrocortisone) .... Apply to the affected area 3-4 times a day 4)  Zyrtec Allergy 10 Mg Caps (Cetirizine hcl) .... Take one tab by mouth daily 5)  Protonix 40 Mg Solr (Pantoprazole sodium) .... One a day 6)  Epipen 0.3 Mg/0.56ml Devi (Epinephrine) .... Use as directed 7)  Prednisone 20 Mg Tabs (Prednisone) .... Take 2 tablets for 2 days, 1 tablet for 2 days , 1/2 tablet for 2 days and then stop. 8)  Diphenhydramine Hcl 25 Mg Tabs (Diphenhydramine hcl) .... Take 1 tab very 4 hours as needed for swelling 9)  Pepcid 20 Mg Tabs (Famotidine) .... Take 1 tablet by mouth two times a day  Patient Instructions: 1)  Please schedule an appointment with me at the begining of next year and keep your appointment on Dec. 20th with the allergy Dr. at [facility].  If you have any trouble breathing or swallowing please go to the ED.  If you develop mild sweeling of your face again call the clinic for further directions.  Always keep your epi pin with you.  Prescriptions: ZYRTEC ALLERGY 10 MG CAPS (CETIRIZINE HCL) Take one tab by mouth daily  #30 x 11   Entered and Authorized by:   Sinda Du MD   Signed by:   Sinda Du MD on 03/11/2010   Method used:   Print then Give to Patient   RxID:   [id number]    Orders Added: 1)  Est. Patient Level II [14782]     Prevention & Chronic Care Immunizations    Influenza vaccine: Fluvax Non-MCR  (12/24/2009)    Tetanus booster: Not documented   Td booster deferral: Deferred  (07/28/2009)    Pneumococcal vaccine: Pneumovax  (06/06/2009)  Colorectal Screening   Hemoccult: Not documented    Colonoscopy: DONE  (07/23/2009)   Colonoscopy due: 07/2019  Other Screening   Pap smear: NEGATIVE FOR INTRAEPITHELIAL LESIONS OR MALIGNANCY. BENIGN REACTIVE/REPARATIVE CHANGES.  (06/06/2009)    Mammogram: ASSESSMENT: Negative - BI-RADS 1^MM DIGITAL SCREENING  (06/11/2009)   Smoking status: current  (03/11/2010)   Smoking cessation counseling: yes  (03/11/2010)  Lipids   Total Cholesterol: 192  (06/09/2009)   LDL: 107  (06/09/2009)   LDL Direct: Not documented   HDL: 70  (06/09/2009)   Triglycerides: 73  (06/09/2009)

## 2010-05-21 NOTE — Assessment & Plan Note (Signed)
Summary: 2WK F/U/EST/VS   Vital Signs:  Patient profile:   55 year old female Height:      61 inches (154.94 cm) Weight:      193.5 pounds (87.95 kg) BMI:     36.69 Temp:     98.1 degrees F Pulse rate:   68 / minute BP sitting:   103 / 75  (left arm) Cuff size:   regular  Vitals Entered By: Dorie Rank RN (July 28, 2009 3:15 PM) CC: Urgent Care saw pt on 4/1 - was diagnosed as having scabies - got some cream and washed belongings Is Patient Diabetic? No Nutritional Status BMI of > 30 = obese  Does patient need assistance? Functional Status Self care Ambulation Normal   Primary Care Provider:  Zara Council MD  CC:  Urgent Care saw pt on 4/1 - was diagnosed as having scabies - got some cream and washed belongings.  History of Present Illness: Maria Nelson is a 55 year old Female with PMH/problems as outlined in the EMR, who presents for follow up review on her skin lesion that was treated as allergy in this clinic. Her itch worsened  and she went to the ER where she also got treatment for scabies. It is improved now, no more itching. No new complaints today.   Preventive Screening-Counseling & Management  Alcohol-Tobacco     Alcohol type: BEER on the w/e     Smoking Status: quit     Smoking Cessation Counseling: yes     Packs/Day: 1.0 per week     Year Quit: yesterday, 01/30/08  Caffeine-Diet-Exercise     Caffeine use/day: 0     Does Patient Exercise: yes     Type of exercise: WALKING     Exercise (avg: min/session): 1 HOUR     Times/week:     5  Current Medications (verified): 1)  Albuterol 90 Mcg/act Aers (Albuterol) .... Two Puffs Every Four Hours As Needed 2)  Advair Diskus 250-50 Mcg/dose Misc (Fluticasone-Salmeterol) .[name].. 1 Puff Two Times A Day 3)  Singulair 10 Mg Tabs (Montelukast Sodium) .... Take 1 Tablet By Mouth Once A Day 4)  Flagyl 500 Mg Tabs (Metronidazole) .... Take 1 Tablet By Mouth Two Times A Day For Seven Days. 5)  Dulcolax 5 Mg  Tbec  (Bisacodyl) .... Day Before Procedure Take 2 At 3pm and 2 At 8pm. 6)  Metoclopramide Hcl 10 Mg  Tabs (Metoclopramide Hcl) .... As Per Prep Instructions. 7)  Miralax   Powd (Polyethylene Glycol 3350) .... As Per Prep  Instructions. 8)  Hydrocortisone 1 % Crea (Hydrocortisone) .... Apply To The Affected Area 3-4 Times A Day 9)  Diphenhydramine Hcl 25 Mg Caps (Diphenhydramine Hcl) .[name].. 1 Tab As Needed For Itching Up To Four Times A Day  Allergies (verified): 1)  ! 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 pylori (antigen)- Dr. Juanda Chance       Treated with protonix 40 two times a day, Biaxin 600 two times a day x14, and Amox 1000mg  two times a       day for 14 days Etoh abuse:  as per patient stopped March 20, 2008after death of sister      no h/o withdrawl Tobacco abuse:  stopped as per patient 03/20/08after death of sister  Past Surgical History: Last updated: 07/26/2006 Tubal ligation (remote) Jaw surgery 2/2 fx from trauma (remote)  Family History: Last updated: 07/26/2006 Family History of Alcoholism/Addiction Family History Diabetes 1st degree relative  Social History: Last updated: 01/24/2007 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   Lungs:  normal respiratory effort and normal breath sounds.   Heart:  normal rate and regular rhythm.   Abdomen:  soft and non-tender.     Pulses:  normal peripheral pulses  Extremities:  no cyanosis, clubbing or edema  Neurologic:  non focal.  Skin:  healing lesion/ erythematous with excoriation marks on bilateral distal extremeties.  Psych:  normally interactive.     Impression & Recommendations:  Problem # 1:  SKIN RASH (ICD-782.1) Completed treatment for scabies and allergy. Improved.  Her updated medication list for this problem includes:    Hydrocortisone 1 % Crea (Hydrocortisone) .[name]... Apply to the affected area 3-4 times a day  Problem # 2:  COPD (ICD-496) Stable on the current regimen .  Her updated medication list for this problem includes:    Albuterol 90 Mcg/act Aers (Albuterol) .[name]..[name] Two puffs every four hours as needed    Advair Diskus 250-50 Mcg/dose Misc (Fluticasone-salmeterol) .[name]... 1 puff two times a day    Singulair 10 Mg Tabs (Montelukast sodium) .[name]... Take 1 tablet by mouth once a day  Complete Medication List: 1)  Albuterol 90 Mcg/act Aers (Albuterol) .... Two puffs every four hours as needed 2)  Advair Diskus 250-50 Mcg/dose Misc (Fluticasone-salmeterol) .[name].. 1 puff two times a day 3)  Singulair 10 Mg Tabs (Montelukast sodium) .... Take 1 tablet by mouth once a day 4)  Flagyl 500 Mg Tabs (Metronidazole) .... Take 1 tablet by mouth two times a day for seven days. 5)  Dulcolax 5 Mg Tbec (Bisacodyl) .... Day before procedure take 2 at 3pm and 2 at 8pm. 6)  Metoclopramide Hcl 10 Mg Tabs (Metoclopramide hcl) .... As per prep instructions. 7)  Miralax Powd (Polyethylene glycol 3350) .... As per prep  instructions. 8)  Hydrocortisone 1 % Crea (Hydrocortisone) .... Apply to the affected area 3-4 times a day 9)  Diphenhydramine Hcl 25 Mg Caps (Diphenhydramine hcl) .[name].. 1 tab as needed for itching up to four times a day  Patient Instructions: 1)  Please schedule a follow-up appointment in 6 months.   Prevention & Chronic Care Immunizations   Influenza vaccine: Fluvax Non-MCR  (06/06/2009)     Tetanus booster: Not documented   Td booster deferral: Deferred  (07/28/2009)    Pneumococcal vaccine: Pneumovax  (06/06/2009)  Colorectal Screening   Hemoccult: Not documented    Colonoscopy: DONE  (07/23/2009)   Colonoscopy due: 07/2019  Other Screening   Pap smear: NEGATIVE FOR INTRAEPITHELIAL LESIONS OR MALIGNANCY. BENIGN REACTIVE/REPARATIVE CHANGES.  (06/06/2009)    Mammogram: ASSESSMENT: Negative - BI-RADS 1^MM DIGITAL SCREENING  (06/11/2009)   Smoking status: quit  (07/28/2009)  Lipids   Total Cholesterol: 192  (06/09/2009)   LDL: 107  (06/09/2009)   LDL Direct: Not documented   HDL: 70  (06/09/2009)   Triglycerides: 73  (06/09/2009)

## 2010-05-21 NOTE — Progress Notes (Signed)
Summary: visit/ hla  Phone Note Other Incoming   Summary of Call: pt presents c/o swelling to L side of face, when looking at pt's face her top L lip is swollen, L eye seems to be somewhat droopy but could be the swelling. she has no resp distress, denies all other problems, fever, chest pain, h/a. pt states she was seen at urgent care for this problem and was given a shot and told to take benadryl, states that does not help. nothing seems to aggravate that pt can pinpoint. cannot give specific history r/t problem. desires to be seen, appt set for 1330. pt agreeable Initial call taken by: Marin Roberts RN,  December 16, 2009 11:43 AM  Follow-up for Phone Call        i have tried the ph# - cell # since pt just left, it was wrong #, will continue to call the other # but pt did state she was noty going home between now and her appt. i did not realize that there was no 1330 appt today that it was really 1430.  Follow-up by: Marin Roberts RN,  December 16, 2009 11:49 AM

## 2010-05-21 NOTE — Progress Notes (Signed)
Summary: med change/gp  Phone Note Refill Request Message from:  Patient on March 18, 2010 5:28 PM  Refills Requested: Medication #1:  FLOVENT HFA 110 MCG/ACT AERO 1 puff twice daily..  Pt. states [facility] pharmacy does not have Flovent; it has to be ordered and does not know how long it will take.  Thanks   Method Requested: Fax to Local Pharmacy Initial call taken by: Chinita Pester RN,  March 18, 2010 5:28 PM  Follow-up for Phone Call        Nurse to contact pt and ask if she is able to afford medications from another phramacy.  Also ask pt to call [facility] health department and get an estimate on when they may have the prescription available.  Follow-up by: Sinda Du MD,  March 18, 2010 10:21 PM

## 2010-05-21 NOTE — Assessment & Plan Note (Signed)
Summary: rash, see note/pcp-Maria Nelson/hla   Vital Signs:  Patient profile:   55 year old female Height:      62 inches (157.48 cm) Weight:      196.9 pounds (90.46 kg) BMI:     36.14 Temp:     98.2 degrees F (36.78 degrees C) oral Pulse rate:   68 / minute BP sitting:   132 / 86  (right arm) Cuff size:   regular  Vitals Entered By: Theotis Barrio NT II (July 09, 2009 9:26 AM) CC: MEDICATION REFILL  /  RIGHT SHOULDER PAIN X 2 WEEKS - NO X-RAYS  ./ RASH ON BOTH ARM , ABD SINCE SATURDAY Is Patient Diabetic? No Pain Assessment Patient in pain? yes     Location: RIGHT SHOULDER Intensity:        5 Type: STIFFINESS Onset of pain  ABOUT 2 WEEKS AGO Nutritional Status BMI of > 30 = obese  Have you ever been in a relationship where you felt threatened, hurt or afraid?No   Does patient need assistance? Functional Status Self care Ambulation Normal Comments MEDICATION REFILL /  RIGHT SHOULDER PAIN X 2 WEEKS /  RASH ON BOTH ARMS AND ABD SINCE SATURDAY   Primary Care Provider:  Zara Council MD  CC:  MEDICATION REFILL  /  RIGHT SHOULDER PAIN X 2 WEEKS - NO X-RAYS  ./ RASH ON BOTH ARM  and ABD SINCE SATURDAY.  History of Present Illness: Maria Nelson is a 55 year old Female with PMH/problems of COPD, PUD, h/o tobacco abuse who comes to the clinic today for:  -- rash: itchy rash on left side of her chest and also on upper extremities. Started Saturday, doesn't recall any thing that could have triggered it. Woke up with itching, started with few bumps and seems to be spreading. No bug bites, no travel out to the woods. Lives with mother, who seems to be doing fine.  Also has discoloration on both wrists that started prior to this which per her is a long term problem   Preventive Screening-Counseling & Management  Alcohol-Tobacco     Alcohol type: BEER on the w/e     Smoking Status: quit     Smoking Cessation Counseling: yes     Packs/Day: 1.0 per week     Year Quit:  yesterday, 01/30/08  Caffeine-Diet-Exercise     Caffeine use/day: 0     Does Patient Exercise: yes     Type of exercise: WALKING     Exercise (avg: min/session): 1 HOUR     Times/week:     5  Current Medications (verified): 1)  Albuterol 90 Mcg/act Aers (Albuterol) .... Two Puffs Every Four Hours As Needed 2)  Advair Diskus 250-50 Mcg/dose Misc (Fluticasone-Salmeterol) .[name].. 1 Puff Two Times A Day 3)  Singulair 10 Mg Tabs (Montelukast Sodium) .... Take 1 Tablet By Mouth Once A Day 4)  Flagyl 500 Mg Tabs (Metronidazole) .... Take 1 Tablet By Mouth Two Times A Day For Seven Days. 5)  Dulcolax 5 Mg  Tbec (Bisacodyl) .... Day Before Procedure Take 2 At 3pm and 2 At 8pm. 6)  Metoclopramide Hcl 10 Mg  Tabs (Metoclopramide Hcl) .... As Per Prep Instructions. 7)  Miralax   Powd (Polyethylene Glycol 3350) .... As Per Prep  Instructions. 8)  Hydrocortisone 1 % Crea (Hydrocortisone) .... Apply To The Affected Area 3-4 Times A Day 9)  Diphenhydramine Hcl 25 Mg Caps (Diphenhydramine Hcl) .[name].. 1 Tab As Needed For Itching Up To  Four Times A Day  Allergies (verified): 1)  ! 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 pylori (antigen)- Dr. Juanda Chance       Treated with protonix 40 two times a day, Biaxin 600 two times a day x14, and Amox 1000mg  two times a       day for 14 days Etoh abuse:  as per patient stopped 04-01-2008after death of sister      no h/o withdrawl Tobacco abuse:  stopped as per patient 2008/04/01after death of sister  Past Surgical History: Last updated: 07/26/2006 Tubal ligation (remote) Jaw surgery 2/2 fx from trauma (remote)  Family History: Last updated: 07/26/2006 Family History of Alcoholism/Addiction Family History Diabetes 1st degree relative  Social History: Last updated:  01/24/2007 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   Mouth:  pharynx pink and moist.   Lungs:  normal respiratory effort and normal breath sounds.   Heart:  normal rate and regular rhythm.   Abdomen:  soft and non-tender.   Pulses:  normal peripheral pulses  Extremities:  no cyanosis, clubbing or edema  Neurologic:  non focal Skin:  erythematous bumps on both UE, erythematous rash on left side of chest with scratch marks.  Psych:  normally interactive.     Impression & Recommendations:  Problem # 1:  SKIN RASH (ICD-782.1) This is most likely allergic rash, could be related to the soaps or clothes, but hard to pin point as she uses several different types of soaps and changes frequently. Will treat her with topical steroid and benadryl. Review in 1-2 weeks.   Her updated medication list for this problem includes:    Hydrocortisone 1 % Crea (Hydrocortisone) .[name]... Apply to the affected area 3-4 times a day  Complete Medication List: 1)  Albuterol 90 Mcg/act Aers (Albuterol) .... Two puffs every four hours as needed 2)  Advair Diskus 250-50 Mcg/dose Misc (Fluticasone-salmeterol) .[name].. 1 puff two times a day 3)  Singulair 10 Mg Tabs (Montelukast sodium) .... Take 1 tablet by mouth once a day 4)  Flagyl 500 Mg Tabs (Metronidazole) .... Take 1 tablet by mouth two times a day for seven days. 5)  Dulcolax 5 Mg Tbec (Bisacodyl) .... Day before procedure take 2 at 3pm and 2 at 8pm. 6)  Metoclopramide Hcl 10 Mg Tabs (Metoclopramide hcl)  .... As per prep instructions. 7)  Miralax Powd (Polyethylene glycol 3350) .... As per prep  instructions. 8)  Hydrocortisone 1 % Crea (Hydrocortisone) .... Apply to the affected area 3-4 times a day 9)  Diphenhydramine Hcl 25 Mg Caps (Diphenhydramine hcl) .[name].. 1 tab as needed for itching up to four times a day  Patient Instructions: 1)  Please schedule a follow-up appointment in 2 weeks. Prescriptions: DIPHENHYDRAMINE HCL 25 MG CAPS (DIPHENHYDRAMINE HCL) 1 tab as needed for itching up to four times a day  #  15 x 1   Entered and Authorized by:   Zara Council MD   Signed by:   Zara Council MD on 07/09/2009   Method used:   Print then Give to Patient   RxID:   [id number] HYDROCORTISONE 1 % CREA (HYDROCORTISONE) Apply to the affected area 3-4 times a day  #1 tube x 1   Entered and Authorized by:   Zara Council MD   Signed by:   Zara Council MD on 07/09/2009   Method used:   Print then Give to Patient   RxID:   [id number]    Prevention & Chronic Care Immunizations   Influenza vaccine: Fluvax Non-MCR  (06/06/2009)    Tetanus booster: Not documented    Pneumococcal vaccine: Pneumovax  (06/06/2009)  Colorectal Screening   Hemoccult: Not documented    Colonoscopy: Not documented  Other Screening   Pap smear: NEGATIVE FOR INTRAEPITHELIAL LESIONS OR MALIGNANCY. BENIGN REACTIVE/REPARATIVE CHANGES.  (06/06/2009)    Mammogram: ASSESSMENT: Negative - BI-RADS 1^MM DIGITAL SCREENING  (06/11/2009)   Smoking status: quit  (07/09/2009)  Lipids   Total Cholesterol: 192  (06/09/2009)   LDL: 107  (06/09/2009)   LDL Direct: Not documented   HDL: 70  (06/09/2009)   Triglycerides: 73  (06/09/2009)

## 2010-05-21 NOTE — Assessment & Plan Note (Signed)
Summary: FU/SB.   Vital Signs:  Patient profile:   55 year old female Height:      61 inches (154.94 cm) Weight:      206.6 pounds (93.91 kg) BMI:     39.18 Temp:     98.1 degrees F (36.72 degrees C) oral Pulse rate:   68 / minute BP sitting:   110 / 77  (right arm) Cuff size:   regular  Vitals Entered By: Theotis Barrio NT II (December 24, 2009 3:15 PM) CC: HERE TO MEET HER NEW DOCTOR   /  NEED RESULTS OF LAB WORK Is Patient Diabetic? No Pain Assessment Patient in pain? no      Nutritional Status BMI of > 30 = obese  Have you ever been in a relationship where you felt threatened, hurt or afraid?No   Does patient need assistance? Functional Status Self care Ambulation Normal   Primary Care Provider:  Zara Council MD  CC:  HERE TO MEET HER NEW DOCTOR   /  NEED RESULTS OF LAB WORK.  History of Present Illness: This is a 55 year old female with a past medical history significant for COPD/asthma and peptic ulcer disease who presents for a referral to an ophthalmologist for a glasses prescription.  The patient has been wearing reading glasses for sometime now and states that they are causing her to have to strain her eyes. The patient was last seen 12/16/2009 for swelling of the left tongue and cheek. She was started on a 6 day prednisone taper. The pt had been taking large amounts of benadryl which did not help so she was changed to zyrtec which she has not yet filled. Compliment C1 and C4 esterase inhibitors were normal, as was CMP, TSH, UDS, and sed rate. Pt states that she has had no episodes of edema since being on the prednisone.   Over the last month the patient has also noticed heartburn when she lies down after eating.  The patient has even had occasional vomiting because of this and has only tried drinking milk to improve it.  Lastly, patient states that her COPD/asthma is under good control, however, she does get mildly symptomatic when exposed to cooking  smoke.        Preventive Screening-Counseling & Management  Alcohol-Tobacco     Alcohol type: BEER on the w/e     Smoking Status: quit     Smoking Cessation Counseling: yes     Packs/Day: 1.0 per week     Year Quit: Feb 2011  Caffeine-Diet-Exercise     Caffeine use/day: 0     Does Patient Exercise: yes     Type of exercise: WALKING     Exercise (avg: min/session): 1 HOUR     Times/week:     5  Allergies: 1)  ! Aspirin  Family History: Reviewed history from 07/26/2006 and no changes required. Family History of Alcoholism/Addiction Family History Diabetes 1st degree relative  Social History: Reviewed history from 01/24/2007 and no changes required. 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  current smoker  Review of Systems       No diarrhea, constipation, no fevers, weight loss, chest pain, melena, or dark stools.   Physical Exam  General:  alert, well-developed, and well-nourished.   Head:  normocephalic and atraumatic.  Eyes:  vision grossly intact, pupils equal, pupils round, and pupils reactive to light.   Ears:  R ear normal and L ear normal.   Nose:  no external deformity and no nasal discharge.   Mouth:  good dentition.   Neck:  supple, full ROM, and no masses.   Lungs:  normal respiratory effort, normal breath sounds, no crackles, and no wheezes.   Heart:  normal rate, regular rhythm, no murmur, no gallop, and no rub.   Abdomen:  soft, non-tender, normal bowel sounds, no distention, and no masses.   Msk:  normal ROM and no joint tenderness.   Pulses:  2+ radial, dp/pt pulses bilaterally.  Extremities:  No edema Neurologic:  alert & oriented X3 and cranial nerves II-XII intact.   Skin:  4 mm hyperpigmented nevus on right cheek. Cervical Nodes:  No cervical  lymphadanopathy.    Impression & Recommendations:  Problem # 1:  ANGIOEDEMA (ICD-995.1) Resolved after 6 day prednisone taper.  Will continue to monitor.  Pt does not have epi pen at this point as she has never had any airway obstruction or SOB as a result of her cheek edema.  Pt will fill zyrtec and was instructed to contact the clinic or go to the ER if she notes new increased swelling or has any difficulty breathing or swallowing.   Problem # 2:  CHRONIC OBSTRUCTIVE PULMONARY DISEASE, ACUTE EXACERBATION (ICD-491.21) Pt feels that here COPD/asthma are under good control and is not haveing to use her albuterol frequently.  The patient does admit to some SOB with cooking smoke exposure and was told to avoid any trigers.  Will continue to monitor.   Problem # 3:  GERD (ICD-530.81) Patient was told about lifestyle modifications that could help her reflux such as: weight loss, limiting alochol use/coffie intake, raising the head of the bed, and avoiding acid containing foods such as tomatos, juices or spicy foods.  I also prescribed omeprazole 20 mg daily at this time.   Her updated medication list for this problem includes:    Omeprazole 20 Mg Cpdr (Omeprazole) .[name]... Take 1 tablet by mouth once a day  Problem # 4:  HYPOTENSION, ORTHOSTATIC (ICD-458.0) Patient was orthostatic at last visit.  Recheck today showed standing BP to be 121/90 hr 75 compared to lieing down 124/86 hr 63.  The orthostasis has apeared to resolve, but patient was told to avoid standing up too quickly without suport.   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  The patient was told the ABC's of melanoma and she stated that she would watch it closely for anychange.   Problem # 6:  Preventive Health Care (ICD-V70.0) Patient recieved her flu shot today as well as a referal to an  ophthalmologist for prescription eye glases.   Orders: Flu Vaccine 32yrs + MEDICARE PATIENTS (G9562) Influenza Vaccine NON MCR (13086) Ophthalmology Referral (Ophthalmology)  Complete Medication List: 1)  Albuterol 90 Mcg/act Aers (Albuterol) .... Two puffs every four hours as needed 2)  Advair Diskus 250-50 Mcg/dose Misc (Fluticasone-salmeterol) .[name].. 1 puff two times a day 3)  Singulair 10 Mg Tabs (Montelukast sodium) .... Take 1 tablet by mouth once a day 4)  Hydrocortisone 1 % Crea (Hydrocortisone) .... Apply to the affected area 3-4 times a day 5)  Zyrtec Allergy 10 Mg Caps (Cetirizine hcl) .... Take one tab  by mouth daily 6)  Omeprazole 20 Mg Cpdr (Omeprazole) .... Take 1 tablet by mouth once a day  Patient Instructions: 1)  Please follow up with your regular doctor in 6 months.  Prescriptions: OMEPRAZOLE 20 MG CPDR (OMEPRAZOLE) Take 1 tablet by mouth once a day  #30 x 5   Entered and Authorized by:   Sinda Du MD   Signed by:   Sinda Du MD on 12/24/2009   Method used:   Print then Give to Patient   RxID:   [id number]    Prevention & Chronic Care Immunizations   Influenza vaccine: Fluvax Non-MCR  (12/24/2009)    Tetanus booster: Not documented   Td booster deferral: Deferred  (07/28/2009)    Pneumococcal vaccine: Pneumovax  (06/06/2009)  Colorectal Screening   Hemoccult: Not documented    Colonoscopy: DONE  (07/23/2009)   Colonoscopy due: 07/2019  Other Screening   Pap smear: NEGATIVE FOR INTRAEPITHELIAL LESIONS OR MALIGNANCY. BENIGN REACTIVE/REPARATIVE CHANGES.  (06/06/2009)    Mammogram: ASSESSMENT: Negative - BI-RADS 1^MM DIGITAL SCREENING  (06/11/2009)   Smoking status: quit  (12/24/2009)  Lipids   Total Cholesterol: 192  (06/09/2009)   LDL: 107  (06/09/2009)   LDL Direct: Not documented   HDL: 70  (06/09/2009)   Triglycerides: 73  (06/09/2009)    Immunizations Administered:  Influenza Vaccine # 1:    Vaccine Type: Fluvax Non-MCR     Site: left deltoid    Mfr: GlaxoSmithKline    Dose: 0.5 ml    Route: IM    Given by: Angelena Form sn-uncg/Gladys Herbin, RN    Exp. Date: 10/17/2010    Lot #: [id number]    VIS given: 11/10/06 version given December 24, 2009.  Flu Vaccine Consent Questions:    Do you have a history of severe allergic reactions to this vaccine? no    Any prior history of allergic reactions to egg and/or gelatin? no    Do you have a sensitivity to the preservative Thimersol? no    Do you have a past history of Guillan-Barre Syndrome? no    Do you currently have an acute febrile illness? no    Have you ever had a severe reaction to latex? no    Vaccine information given and explained to patient? yes    Are you currently pregnant? no

## 2010-05-21 NOTE — Progress Notes (Signed)
Summary: omeprazole/ hla  Phone Note From Pharmacy   Summary of Call: [facility] asks if we can change the omeprazole to nexium, protonix, aciphex or dexilant, they can obtain these for the pt at no charge. please advise Initial call taken by: Marin Roberts RN,  January 09, 2010 2:48 PM  Follow-up for Phone Call        I have faxed the prescription for protonix.    New/Updated Medications: PROTONIX 20 MG TBEC (PANTOPRAZOLE SODIUM)  Prescriptions: PROTONIX 20 MG TBEC (PANTOPRAZOLE SODIUM)   #30 x 3   Entered and Authorized by:   Sinda Du MD   Signed by:   Sinda Du MD on 01/11/2010   Method used:   Faxed to ...       [facility] Department (retail)       [address] [place], [place]  [address]       Ph: [phone]       Fax: [phone]   RxID:   [id number]

## 2010-05-21 NOTE — Progress Notes (Signed)
Summary: [facility]   Phone Note Call from Patient Call back at [facility] Phone [phone]   Caller: Patient Call For: Dr Jarold Motto Reason for Call: Acute Illness, Talk to Nurse Details for Reason: Triage Summary of Call: Pt went to ER last Friday 07/11/09 and was dx scabies; Would like to know if she can keep appt for Colonoscopy Initial call taken by: Dwan Bolt,  July 15, 2009 12:50 PM  Follow-up for Phone Call        Appt for colon is April 6. Follow-up by: Ashok Cordia RN,  July 15, 2009 12:54 PM  Additional Follow-up for Phone Call Additional follow up Details #1::        She should be fine for colonoscopy exam. Additional Follow-up by: Mardella Layman MD FACG,  July 15, 2009 2:43 PM    Additional Follow-up for Phone Call Additional follow up Details #2::    Pt notified. Follow-up by: Ashok Cordia RN,  July 15, 2009 2:52 PM

## 2010-05-21 NOTE — Assessment & Plan Note (Signed)
Summary: EST-F/U VISIT/CH   Vital Signs:  Patient profile:   55 year old female Height:      61 inches (154.94 cm) Weight:      210.2 pounds (95.55 kg) BMI:     39.86 Temp:     98.6 degrees F (37.00 degrees C) oral Pulse rate:   80 / minute BP sitting:   112 / 75  (right arm) Cuff size:   large  Vitals Entered By: Theotis Barrio NT II (April 29, 2010 3:22 PM) CC: PATIENT FOLLOW UP  SEEN BY [facility](ALLERGY)  / MAMMOGRAM APPT -FEB.-24, 012 @ 1:50PM/ LAST PAP- 2-011 NEG. Is Patient Diabetic? No Pain Assessment Patient in pain? no      Nutritional Status BMI of > 30 = obese  Have you ever been in a relationship where you felt threatened, hurt or afraid?No   Does patient need assistance? Functional Status Self care Ambulation Normal   Primary Care Provider:  Zara Council MD  CC:  PATIENT FOLLOW UP  SEEN BY [facility](ALLERGY)  / MAMMOGRAM APPT -FEB.-24 and 012 @ 1:50PM/ LAST PAP- 2-011 NEG..  History of Present Illness: This is a 55 year old female last seen on 11/23 with a hx of angioedema, gerd, and asthma/COPD. At her last visit, pts angioedema had resolved but due to concern that it could have been caused by her Advair, pt was started on flovent for her asthma.  In the interim, pt saw and allergist at [facility] who recommended  In vitro assay for aeroallergens which has been done, they added hydroxyzine, restarted advair and sent pt for PFT's with a plan to see her back in 1 month.  Pt states that tongue swelled up last week, this was left sided only, and pt took two hydroxyzine which lead to resolution by the next day.  Pt continues to be unaware of any triggers but states that she never had any difficulty breathing or swallowing.  Pt has also been recently set up with the MAP program and got 6 months of advair and protonix. Now that pt has restarted advair she is no longer requiring any albuterol for her asthma/COPD.   Pt denies any fever, chills, rashes, itching, or change in  BM's.   Preventive Screening-Counseling & Management  Alcohol-Tobacco     Alcohol type: BEER on the w/e     Smoking Status: current     Smoking Cessation Counseling: yes     Packs/Day: 1.0 cig every 2-3 days     Year Quit: Feb 2011  Caffeine-Diet-Exercise     Caffeine use/day: 0     Does Patient Exercise: yes     Type of exercise: WALKING     Exercise (avg: min/session): 1 HOUR     Times/week:     5  Allergies: 1)  ! Aspirin  Past History:  Past Medical History: Last updated: 11/27/2007 COPD 2/2 Tobacco use (need PFT's for verification) Gastrointestinal hemorrhage, hx of 2/2 PUD on Jul 27, 2006     requiring blood transfusion Peptic ulcer disease +H. pylori (antigen)- Dr. Juanda Chance       Treated with protonix 40 two times a day, Biaxin 600 two times a day x14, and Amox 1000mg  two times a       day for 14 days Etoh abuse:  as per patient stopped 09-Apr-2008after death of sister      no h/o withdrawl Tobacco abuse:  stopped as per patient 09-Apr-2008after death of sister  Family  History: Reviewed history from 07/26/2006 and no changes required. Family History of Alcoholism/Addiction Family History Diabetes 1st degree relative  Social History: Reviewed history from 12/24/2009 and no changes required. 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  current smoker  Review of Systems       Negative as per HPI.   Physical Exam  General:  alert and well-developed.   Head:  normocephalic, atraumatic, and no abnormalities observed.   Eyes:  vision grossly intact, pupils equal, pupils round, and pupils reactive to light.   Mouth:  pharynx pink and moist, no erythema, and no exudates.   Neck:  supple, full ROM, and no masses.   Lungs:  normal respiratory effort, normal breath sounds, no  crackles, and no wheezes.   Heart:  normal rate, regular rhythm, no murmur, no gallop, and no rub.   Abdomen:  soft, non-tender, normal bowel sounds, and no distention.   Msk:  normal ROM.   Pulses:  2+ peripheral pulses throughout.  Extremities:  NO edema.  Neurologic:  cranial nerves II-XII intact.   Skin:  No rashes or uticaria.    Impression & Recommendations:  Problem # 1:  ANGIOEDEMA (ICD-995.1) Pt is to continue following her allergist at [facility].  We have requested records for the patients visit but the aeroallergens assay has not been returned.  Pt has only had one episode of this recently and it was readily resolved with her hydroxyzine.  Pt was instructed to always keep an epi pin on her and to call the ED if she develops throat swelling that causes difficulty swallowing or breathing.  Pt is to continue monitoring for triggers.  Pt is to be seen back within the next month.  Problem # 2:  GERD (ICD-530.81) I discontinued Pepcid today as the patient is now apart of the MAP program and has been given about 6 months of protonix.  Pt instructed to take daily.    The following medications were removed from the medication list:    Pepcid 20 Mg Tabs (Famotidine) .[name]... Take 1 tablet by mouth two times a day Her updated medication list for this problem includes:    Protonix 40 Mg Solr (Pantoprazole sodium) ..... One a day  Problem # 3:  COPD (ICD-496) Pt was restarted on advair by [facility] and was sent for PFT's.  Pt is scheduled to repeat her PFT's at her next visit.  Will follow up on these results when they are available. At this point, Pt denies any sob and her asthma is very well controlled.  Pt is no longer requiring albuterol.  I DC'd flovent now that pt is on advair.  The following medications were removed from the medication list:    Flovent Hfa 110 Mcg/act Aero (Fluticasone propionate  hfa) .[name]... 1 puff twice daily.    Flovent Hfa 44 Mcg/act Aero (Fluticasone propionate  hfa) .....  Inhale 2 puffs two times a day Her updated medication list for this problem includes:    Albuterol 90 Mcg/act Aers (Albuterol) .[name]..[name] Two puffs every four hours as needed    Singulair 10 Mg Tabs (Montelukast sodium) .[name]... Take 1 tablet by mouth once a day    Advair Diskus 250-50 Mcg/dose Aepb (Fluticasone-salmeterol) .[name]... Take 1 puff twice daily.  Problem # 4:  PREVENTIVE HEALTH CARE (ICD-V70.0) Pt scheduled  for Feb24th for a mammogram and we will do a pap smear at next visit.   Orders: Mammogram (Screening) (Mammo)  Complete Medication List: 1)  Albuterol 90 Mcg/act Aers (Albuterol) .... Two puffs every four hours as needed 2)  Singulair 10 Mg Tabs (Montelukast sodium) .... Take 1 tablet by mouth once a day 3)  Hydrocortisone 1 % Crea (Hydrocortisone) .... Apply to the affected area 3-4 times a day 4)  Zyrtec Allergy 10 Mg Caps (Cetirizine hcl) .... Take one tab by mouth daily 5)  Protonix 40 Mg Solr (Pantoprazole sodium) .... One a day 6)  Epipen 0.3 Mg/0.74ml Devi (Epinephrine) .... Use as directed 7)  Prednisone 20 Mg Tabs (Prednisone) .... Take 2 tablets for 2 days, 1 tablet for 2 days , 1/2 tablet for 2 days and then stop. 8)  Diphenhydramine Hcl 25 Mg Tabs (Diphenhydramine hcl) .... Take 1 tab very 4 hours as needed for swelling 9)  Hydroxyzine Hcl 50 Mg/ml Soln (Hydroxyzine hcl) .... Take 1-2 tabs every 6 hours for swelling. 10)  Advair Diskus 250-50 Mcg/dose Aepb (Fluticasone-salmeterol) .... Take 1 puff twice daily.  Patient Instructions: 1)  Please continue to keep an epi pin on you at all times.  Take your hydroxyzine and call the clinic if you develop any swelling.  Continue to follow up with the allergy Dr. at [facility].  2)  Please schedule a follow-up appointment in 4 months.   Orders Added: 1)  Est. Patient Level III [16109] 2)  Mammogram (Screening) [Mammo]    Prevention & Chronic Care Immunizations   Influenza vaccine: Fluvax Non-MCR  (12/24/2009)    Tetanus  booster: Not documented   Td booster deferral: Deferred  (07/28/2009)    Pneumococcal vaccine: Pneumovax  (06/06/2009)  Colorectal Screening   Hemoccult: Not documented    Colonoscopy: DONE  (07/23/2009)   Colonoscopy due: 07/2019  Other Screening   Pap smear: NEGATIVE FOR INTRAEPITHELIAL LESIONS OR MALIGNANCY. BENIGN REACTIVE/REPARATIVE CHANGES.  (06/06/2009)    Mammogram: ASSESSMENT: Negative - BI-RADS 1^MM DIGITAL SCREENING  (06/11/2009)   Smoking status: current  (04/29/2010)   Smoking cessation counseling: yes  (04/29/2010)  Lipids   Total Cholesterol: 192  (06/09/2009)   LDL: 107  (06/09/2009)   LDL Direct: Not documented   HDL: 70  (06/09/2009)   Triglycerides: 73  (06/09/2009)

## 2010-05-21 NOTE — Assessment & Plan Note (Signed)
Summary: FU/SB.   Vital Signs:  Patient profile:   55 year old female Height:      61 inches (154.94 cm) Weight:      206.02 pounds (93.65 kg) BMI:     39.07 O2 Sat:      94 % on Room air Temp:     98.4 degrees F (36.89 degrees C) oral Pulse rate:   74 / minute BP sitting:   104 / 75  (right arm) Cuff size:   large  Vitals Entered By: Angelina Ok RN (February 16, 2010 3:34 PM)  O2 Flow:  Room air CC: Depression Is Patient Diabetic? No Pain Assessment Patient in pain? yes     Location: Lip Intensity: 10 Type: aching Onset of pain  Constant Nutritional Status BMI of > 30 = obese  Have you ever been in a relationship where you felt threatened, hurt or afraid?No   Does patient need assistance? Functional Status Self care Ambulation Normal Comments Woke up this am with a swollen lip.  Painful.  Facial swelling.  Has been office the Advair for 2 weeks.  Took Benadryl for swelling.  Has not taken anything today.  Body feels errie.  Feels like she is going to die some days.  Does not feel well.  Prednisone helps with her chwst tightness.  Coughing up clear mucous.  No fevers-hot and cold sweats.   Primary Care Provider:  Zara Council MD  CC:  Depression.  History of Present Illness: 55 yr old woman with pmhx as described below comes to the clinic for management of recurrent angioedema. Patient reports that she stopped using Advair but continues to have episodes of angioedema on her face especially her lip since last being seen. Denies any shortness of breath or laryngeal edema. Denies having to use epipen. Reports to be taking all other medication as directed. Denies fever or chills.   Patient reports to have a dry cough and increased wheezing of 1 week duration.   Depression History:      The patient denies a depressed mood most of the day and a diminished interest in her usual daily activities.         Preventive Screening-Counseling & Management  Alcohol-Tobacco  Alcohol type: BEER on the w/e     Smoking Status: current     Smoking Cessation Counseling: yes     Packs/Day: 1.0 cig every 2-3 days     Year Quit: Feb 2011  Comments: 1 pack last 4 or 5 days now.  Weening self off the cigerettes.  Problems Prior to Update: 1)  Nevus, Non-neoplastic  (ICD-448.1) 2)  Gerd  (ICD-530.81) 3)  Hypotension, Orthostatic  (ICD-458.0) 4)  Angioedema  (ICD-995.1) 5)  Vaginal Discharge  (ICD-623.5) 6)  Preventive Health Care  (ICD-V70.0) 7)  Intertrigo, Candidal  (ICD-695.89) 8)  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) 9)  Periapical Abscess Without Sinus  (ICD-522.5) 10)  Dental Pain  (ICD-525.9) 11)  Chronic Gingivitis Plaque Induced  (ICD-523.10) 12)  Pain in Joint, Forearm  (ICD-719.43) 13)  Stress Incontinence  (ICD-788.39) 14)  Tobacco Abuse  (ICD-305.1) 15)  Asthma  (ICD-493.90) 16)  Chronic Obstructive Pulmonary Disease, Acute Exacerbation  (ICD-491.21) 17)  Family History Diabetes 1st Degree Relative  (ICD-V18.0) 18)  Family History Breast Cancer 1st Degree Relative <50  (ICD-V16.3) 19)  Family History of Alcoholism/addiction  (ICD-V61.41) 20)  Peptic Ulcer Disease  (ICD-533.90) 21)  Gastrointestinal Hemorrhage, Hx of  (ICD-V12.79) 22)  COPD  (ICD-496)  Medications  Prior to Update: 1)  Albuterol 90 Mcg/act Aers (Albuterol) .... Two Puffs Every Four Hours As Needed 2)  Singulair 10 Mg Tabs (Montelukast Sodium) .... Take 1 Tablet By Mouth Once A Day 3)  Hydrocortisone 1 % Crea (Hydrocortisone) .... Apply To The Affected Area 3-4 Times A Day 4)  Zyrtec Allergy 10 Mg Caps (Cetirizine Hcl) .... Take One Tab By Mouth Daily 5)  Protonix 40 Mg Solr (Pantoprazole Sodium) .... One A Day 6)  Zantac 150 Mg Tabs (Ranitidine Hcl) .... Take 1 Tablet By Mouth Two Times A Day 7)  Epipen 0.3 Mg/0.58ml Devi (Epinephrine) .... Use As Directed  Current Medications (verified): 1)  Albuterol 90 Mcg/act Aers (Albuterol) .... Two Puffs Every Four Hours As  Needed 2)  Singulair 10 Mg Tabs (Montelukast Sodium) .... Take 1 Tablet By Mouth Once A Day 3)  Hydrocortisone 1 % Crea (Hydrocortisone) .... Apply To The Affected Area 3-4 Times A Day 4)  Zyrtec Allergy 10 Mg Caps (Cetirizine Hcl) .... Take One Tab By Mouth Daily 5)  Protonix 40 Mg Solr (Pantoprazole Sodium) .... One A Day 6)  Zantac 150 Mg Tabs (Ranitidine Hcl) .... Take 1 Tablet By Mouth Two Times A Day 7)  Epipen 0.3 Mg/0.20ml Devi (Epinephrine) .... Use As Directed 8)  Prednisone 10 Mg Tabs (Prednisone) .... Take 6 Tablets By Mouth Then Taper Down By One Tablet Until Done With Medicaiton  Allergies: 1)  ! Aspirin  Past History:  Past Medical History: Last updated: 11/27/2007 COPD 2/2 Tobacco use (need PFT's for verification) Gastrointestinal hemorrhage, hx of 2/2 PUD on 2006-07-22     requiring blood transfusion Peptic ulcer disease +H. pylori (antigen)- Dr. Juanda Chance       Treated with protonix 40 two times a day, Biaxin 600 two times a day x14, and Amox 1000mg  two times a       day for 14 days Etoh abuse:  as per patient stopped 2008/04/04after death of sister      no h/o withdrawl Tobacco abuse:  stopped as per patient April 04, 2008after death of sister  Past Surgical History: Last updated: 07/26/2006 Tubal ligation (remote) Jaw surgery 2/2 fx from trauma (remote)  Family History: Last updated: 07/26/2006 Family History of Alcoholism/Addiction Family History Diabetes 1st degree relative  Social History: Last updated: 12/24/2009 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  current smoker  Risk Factors: Caffeine Use: 0 (02/02/2010) Exercise: yes (02/02/2010)  Risk Factors: Smoking Status: current (02/16/2010) Packs/Day: 1.0 cig every 2-3 days (02/16/2010)  Family  History: Reviewed history from 07/26/2006 and no changes required. Family History of Alcoholism/Addiction Family History Diabetes 1st degree relative  Social History: Reviewed history from 12/24/2009 and no changes required. 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  current smoker  Review of Systems  The patient denies fever, chest pain, prolonged cough, hemoptysis, abdominal pain, melena, hematochezia, hematuria, muscle weakness, difficulty walking, and unusual weight change.    Physical Exam  General:  NAD, vitals signs reviewed Head:  normocephalic and atraumatic.   Eyes:  vision grossly intact.   Ears:  no external deformities.   Nose:  no external deformity.   Mouth:  left side of lip swollen Neck:  supple and full ROM.   Lungs:  expiratory wheezes, normal respiratory effort, normal breath sounds, and no crackles Heart:  normal rate, regular rhythm, no murmur, no gallop, and no rub.   Abdomen:  soft, non-tender, normal bowel sounds, no distention, and no masses.   Msk:  normal ROM.   Extremities:  no edema Neurologic:  alert & oriented X3.     Impression & Recommendations:  Problem # 1:  ANGIOEDEMA (ICD-995.1) Unknown etiology. May be due to so some allergenic that patient is exposing herself at home. Will referr patient to Allergy specialist. Will rule out C 1 inhibitor deficiency. Check chest xray rule out any underlying pathology (mass causing SVC syndrome) that may be contributing to facial swelling. Start patient on steroid taper. Instructed to use epi pen if she develops and laryngeal edema and to go to the ED.  Orders: Allergy Referral  (Allergy) T- * Misc. Laboratory test [phone]) CXR- 2view (CXR)  Problem # 2:  ASTHMA (ICD-493.90) Mild exacerbation 2/2 to viral URTI.  Symptomatic treatment and prednisone taper. Will follow up.   Her updated medication list for this problem includes:    Albuterol 90 Mcg/act Aers (Albuterol) .[name]..[name] Two puffs every four hours as needed    Singulair 10 Mg Tabs (Montelukast sodium) .[name]... Take 1 tablet by mouth once a day    Prednisone 10 Mg Tabs (Prednisone) .[name]... Take 6 tablets by mouth then taper down by one tablet until done with medicaiton  Problem # 3:  TOBACCO ABUSE (ICD-305.1)  Encouraged smoking cessation and discussed different methods for smoking cessation.   Complete Medication List: 1)  Albuterol 90 Mcg/act Aers (Albuterol) .... Two puffs every four hours as needed 2)  Singulair 10 Mg Tabs (Montelukast sodium) .... Take 1 tablet by mouth once a day 3)  Hydrocortisone 1 % Crea (Hydrocortisone) .... Apply to the affected area 3-4 times a day 4)  Zyrtec Allergy 10 Mg Caps (Cetirizine hcl) .... Take one tab by mouth daily 5)  Protonix 40 Mg Solr (Pantoprazole sodium) .... One a day 6)  Zantac 150 Mg Tabs (Ranitidine hcl) .... Take 1 tablet by mouth two times a day 7)  Epipen 0.3 Mg/0.3ml Devi (Epinephrine) .... Use as directed 8)  Prednisone 10 Mg Tabs (Prednisone) .... Take 6 tablets by mouth then taper down by one tablet until done with medicaiton  Patient Instructions: 1)  Please schedule a follow-up appointment in 2 weeks. 2)  Take all medication as directed. Prescriptions: PREDNISONE 10 MG TABS (PREDNISONE) Take 6 tablets by mouth then taper down by one tablet until done with medicaiton  #21 x 0   Entered and Authorized by:   Laren Everts MD   Signed by:   Laren Everts MD on 02/17/2010   Method used:   Faxed to ...       [facility] (retail)       [address] [place], [place]  [address]       Ph: [phone]       Fax: [phone]   RxID:   [id number]    Orders Added: 1)  Allergy Referral  [Allergy] 2)  T- * Misc. Laboratory test [99999] 3)   CXR- 2view [CXR] 4)  Est. Patient Level III [72536]    Prevention & Chronic Care Immunizations   Influenza vaccine: Fluvax Non-MCR  (12/24/2009)  Tetanus booster: Not documented   Td booster deferral: Deferred  (07/28/2009)    Pneumococcal vaccine: Pneumovax  (06/06/2009)  Colorectal Screening   Hemoccult: Not documented    Colonoscopy: DONE  (07/23/2009)   Colonoscopy due: 07/2019  Other Screening   Pap smear: NEGATIVE FOR INTRAEPITHELIAL LESIONS OR MALIGNANCY. BENIGN REACTIVE/REPARATIVE CHANGES.  (06/06/2009)    Mammogram: ASSESSMENT: Negative - BI-RADS 1^MM DIGITAL SCREENING  (06/11/2009)   Smoking status: current  (02/16/2010)   Smoking cessation counseling: yes  (02/16/2010)  Lipids   Total Cholesterol: 192  (06/09/2009)   LDL: 107  (06/09/2009)   LDL Direct: Not documented   HDL: 70  (06/09/2009)   Triglycerides: 73  (06/09/2009)

## 2010-06-11 ENCOUNTER — Encounter: Payer: Self-pay | Admitting: Ophthalmology

## 2010-06-12 ENCOUNTER — Ambulatory Visit (HOSPITAL_COMMUNITY)
Admission: RE | Admit: 2010-06-12 | Discharge: 2010-06-12 | Disposition: A | Discharge status: Home / Self Care | Payer: Self-pay | Source: Ambulatory Visit | Attending: Ophthalmology | Admitting: Ophthalmology

## 2010-06-12 ENCOUNTER — Encounter (HOSPITAL_COMMUNITY): Payer: Self-pay

## 2010-06-12 DIAGNOSIS — Z139 Encounter for screening, unspecified: Secondary | ICD-10-CM | POA: Insufficient documentation

## 2010-06-12 DIAGNOSIS — Z1231 Encounter for screening mammogram for malignant neoplasm of breast: Secondary | ICD-10-CM

## 2010-06-22 ENCOUNTER — Ambulatory Visit (INDEPENDENT_AMBULATORY_CARE_PROVIDER_SITE_OTHER): Payer: Self-pay

## 2010-06-22 ENCOUNTER — Inpatient Hospital Stay (INDEPENDENT_AMBULATORY_CARE_PROVIDER_SITE_OTHER)
Admission: RE | Admit: 2010-06-22 | Discharge: 2010-06-22 | Disposition: A | Discharge status: Home / Self Care | Payer: Self-pay | Source: Ambulatory Visit | Attending: Family Medicine | Admitting: Family Medicine

## 2010-06-22 DIAGNOSIS — M25519 Pain in unspecified shoulder: Secondary | ICD-10-CM

## 2010-07-02 LAB — CBC
HCT: 42.3 % (ref 36.0–46.0)
Hemoglobin: 14.1 g/dL (ref 12.0–15.0)
MCV: 81.5 fL (ref 78.0–100.0)
RDW: 14 % (ref 11.5–15.5)
WBC: 7.4 10*3/uL (ref 4.0–10.5)

## 2010-07-02 LAB — POCT I-STAT, CHEM 8
BUN: 10 mg/dL (ref 6–23)
Calcium, Ion: 1.06 mmol/L — ABNORMAL LOW (ref 1.12–1.32)
Creatinine, Ser: 1.1 mg/dL (ref 0.4–1.2)
Hemoglobin: 15.3 g/dL — ABNORMAL HIGH (ref 12.0–15.0)
Sodium: 140 mEq/L (ref 135–145)
TCO2: 25 mmol/L (ref 0–100)

## 2010-07-02 LAB — DIFFERENTIAL
Basophils Absolute: 0 10*3/uL (ref 0.0–0.1)
Eosinophils Relative: 7 % — ABNORMAL HIGH (ref 0–5)
Lymphocytes Relative: 41 % (ref 12–46)
Monocytes Absolute: 0.6 10*3/uL (ref 0.1–1.0)
Monocytes Relative: 8 % (ref 3–12)
Neutro Abs: 3.3 10*3/uL (ref 1.7–7.7)

## 2010-07-06 ENCOUNTER — Other Ambulatory Visit: Payer: Self-pay | Admitting: *Deleted

## 2010-07-07 MED ORDER — MONTELUKAST SODIUM 10 MG PO TABS: 10.0000 mg | ORAL_TABLET | Freq: Every day | ORAL | Status: DC

## 2010-07-08 NOTE — Telephone Encounter (Signed)
Faxed to [facility]

## 2010-07-22 ENCOUNTER — Ambulatory Visit: Payer: Self-pay | Admitting: Ophthalmology

## 2010-07-28 ENCOUNTER — Ambulatory Visit: Payer: Self-pay | Admitting: Ophthalmology

## 2010-07-28 ENCOUNTER — Ambulatory Visit (INDEPENDENT_AMBULATORY_CARE_PROVIDER_SITE_OTHER): Payer: Self-pay | Admitting: Ophthalmology

## 2010-07-28 ENCOUNTER — Encounter: Payer: Self-pay | Admitting: Ophthalmology

## 2010-07-28 DIAGNOSIS — B86 Scabies: Secondary | ICD-10-CM

## 2010-07-28 DIAGNOSIS — J4489 Other specified chronic obstructive pulmonary disease: Secondary | ICD-10-CM

## 2010-07-28 DIAGNOSIS — Z0000 Encounter for general adult medical examination without abnormal findings: Secondary | ICD-10-CM

## 2010-07-28 DIAGNOSIS — T783XXA Angioneurotic edema, initial encounter: Secondary | ICD-10-CM

## 2010-07-28 DIAGNOSIS — K219 Gastro-esophageal reflux disease without esophagitis: Secondary | ICD-10-CM

## 2010-07-28 DIAGNOSIS — Z1211 Encounter for screening for malignant neoplasm of colon: Secondary | ICD-10-CM | POA: Insufficient documentation

## 2010-07-28 DIAGNOSIS — J449 Chronic obstructive pulmonary disease, unspecified: Secondary | ICD-10-CM

## 2010-07-28 MED ORDER — PERMETHRIN 1 % EX LOTN: TOPICAL_LOTION | Freq: Once | CUTANEOUS | Status: AC

## 2010-07-28 NOTE — Assessment & Plan Note (Signed)
The patient is scheduled to get repeat liver function test performed in July at [facility], for now, though the patient continues to have some mild shortness of breath on exertion, she did not have any wheezes on physical exam. I recommended that the patient continue to decrease her tobacco use to try to improve her symptoms. I'll see the patient again in 3 weeks, and if she continues to have significant shortness of breath, I will consider increasing her Advair dose.

## 2010-07-28 NOTE — Progress Notes (Signed)
Subjective:    Patient ID: Maria Nelson, female    DOB: 1956-03-22, 55 y.o.   MRN: [id number]  HPI  This is a 55 year old female with a past medical history significant for COPD, angioedema, and peptic ulcer disease status post hemorrhage in 2008, who presents for routine followup. At our last visit, the patient had had one episode of angioedema which resolved quickly with hydroxyzine. The patient was scheduled to followup with her allergist at [facility] in the meantime. The patient had been restarted on her Advair to Flovent was discontinued. Since that time, the patient has had one episode of facial swelling, which quickly resolved with her hydroxyzine once again. The patient was scheduled to followup at [facility] on March 30 however she was unable to attend this appointment and was rescheduled for July 6. I encouraged the patient to be sure to make this appointment for continued and further management of her angioedema. The patient does complain of mild shortness of breath today with exertion, though she has been using her inhalers regularly. The patient is currently on albuterol, Advair, and Spiriva. The patient denies significant shortness of breath at rest. The patient also denies nighttime awakenings. The patient does continue to smoke one pack of cigarettes every 4 days. The patient also complains of a maculopapular rash on her left wrist that has been coming and going for years and has been resolved with Permethrin in the past. Review of Systems  Constitutional: Negative for fever and chills.  Respiratory: Negative for cough.   Cardiovascular: Negative for chest pain and palpitations.  Gastrointestinal: Negative for vomiting, diarrhea and constipation.       Objective:   Physical Exam  Constitutional: She appears well-developed and well-nourished.  HENT:  Head: Normocephalic and atraumatic.  Eyes: Pupils are equal, round, and reactive to light.  Cardiovascular: Normal rate,  regular rhythm and intact distal pulses.  Exam reveals no gallop and no friction rub.   No murmur heard. Pulmonary/Chest: Effort normal and breath sounds normal. She has no wheezes. She has no rales.       Lungs are completely clear to auscultation.  Abdominal: Soft. Bowel sounds are normal. She exhibits no distension. There is no tenderness.  Musculoskeletal: Normal range of motion.  Neurological: She is alert. No cranial nerve deficit.  Skin: No rash noted.       There is a maculopapular rash over the medial portion of the patient's left wrist with some surrounding hyperkeratosis.       Current Outpatient Prescriptions on File Prior to Visit  Medication Sig Dispense Refill  . albuterol (PROVENTIL,VENTOLIN) 90 MCG/ACT inhaler Inhale 2 puffs into the lungs every 4 (four) hours as needed.        . cetirizine (ZYRTEC) 10 MG tablet Take 10 mg by mouth daily.        . diphenhydrAMINE (SOMINEX) 25 MG tablet Take 25 mg by mouth every 4 (four) hours as needed. For swelling       . EPINEPHrine (EPIPEN JR) 0.15 MG/0.3ML injection Inject 0.15 mg into the muscle as needed.        [name] EPINEPHrine (EPIPEN) 0.3 mg/0.3 mL DEVI Inject 0.3 mg into the muscle once.        . Fluticasone-Salmeterol (ADVAIR DISKUS) 250-50 MCG/DOSE AEPB Inhale 1 puff into the lungs 2 (two) times daily.        . hydrocortisone 1 % cream Apply topically 3 (three) times daily. Apply to the affected area 3-4 times daily as  needed.       . montelukast (SINGULAIR) 10 MG tablet Take 1 tablet (10 mg total) by mouth at bedtime.  90 tablet  3  . pantoprazole (PROTONIX) 40 MG tablet Take 40 mg by mouth daily.          Past Medical History  Diagnosis Date  . COPD (chronic obstructive pulmonary disease)     secondary to tobacco use  . Gastrointestinal hemorrhage     secondary to PUD on March 2008  . Peptic ulcer disease     +H. pylori (antigen)- Dr. Juanda Chance- treated  . ETOH abuse     Pt stopped in 3/08  . Tobacco abuse     Pt stopped  in 3/08      Assessment & Plan:

## 2010-07-28 NOTE — Assessment & Plan Note (Signed)
The patient has only had one episode of angioedema since her last office visit, and this appears to be stable. At this point, we'll continue to follow the recommendations made from [facility], and continue the patient on hydroxyzine, and Benadryl. The patient continues to deny any difficulty breathing or swallowing with episodes, and they've been quickly resolved with her medications. The patient does keep an EpiPen with her.

## 2010-07-28 NOTE — Assessment & Plan Note (Signed)
The patient requested that I wait to perform her OB/GYN exam until her next visit.

## 2010-07-28 NOTE — Assessment & Plan Note (Signed)
The patients GERD is under good control at this time and the patient is taking their medications regularly.  The patient was encouraged to continue to avoid triggers, avoid laying flat within two hours of a meal.  Will continue to monitor for continued control at future visits.   

## 2010-07-28 NOTE — Assessment & Plan Note (Signed)
>>  ASSESSMENT AND PLAN FOR URTICARIA/ANGIOEDEMA WRITTEN ON 07/28/2010  3:39 PM BY BOWEN, BRADLEY  The patient has only had one episode of angioedema since her last office visit, and this appears to be stable. At this point, we'll continue to follow the recommendations made from [facility], and continue the patient on hydroxyzine, and Benadryl. The patient continues to deny any difficulty breathing or swallowing with episodes, and they've been quickly resolved with her medications. The patient does keep an EpiPen with her.

## 2010-07-28 NOTE — Assessment & Plan Note (Signed)
The patient has had a rash on the medial surface of her wrists off and on over the last year, this has been treated in the past with Prometrium with resolution of her symptoms. I once again treat the patient today, however if this returns I would consider an alternate diagnosis such as eczema.

## 2010-07-28 NOTE — Patient Instructions (Signed)
I like to see you again in 3 weeks to ensure improvement in both your rash and your shortness of breath. If yoru shortness of breath gets worse or you have worsening episodes of angioedema, please contact the clinic or go to the emergency room.

## 2010-08-05 ENCOUNTER — Other Ambulatory Visit: Payer: Self-pay | Admitting: *Deleted

## 2010-08-05 MED ORDER — ALBUTEROL 90 MCG/ACT IN AERS: 2.0000 | INHALATION_SPRAY | RESPIRATORY_TRACT | Status: DC | PRN

## 2010-08-05 NOTE — Telephone Encounter (Signed)
Proventil refill request form faxed to [facility].

## 2010-08-10 ENCOUNTER — Other Ambulatory Visit: Payer: Self-pay | Admitting: Ophthalmology

## 2010-08-10 MED ORDER — ALBUTEROL 90 MCG/ACT IN AERS: 2.0000 | INHALATION_SPRAY | RESPIRATORY_TRACT | Status: DC | PRN

## 2010-08-12 ENCOUNTER — Telehealth: Payer: Self-pay | Admitting: *Deleted

## 2010-08-12 NOTE — Telephone Encounter (Signed)
Albuterol 74mcg/ACT inhaler rx faxed to [facility] pharmacy per Dr. Cathey Endow.

## 2010-09-01 NOTE — Letter (Signed)
October 25, 2006    Dr. Robb Matar, family practice   RE:  Maria Nelson, Maria Nelson  MRN:  [id number]  /  DOB:  Apr 01, 1956   Dear Dr. Robb Matar,   As you are aware, Maria Nelson is a pleasant 55 year old African-  American woman who is referred for an evaluation of COPD.  Maria Nelson has  required two courses of prednisone taper in March, 2008 and May, 2008  after an attack of asthmatic bronchitis that did not respond to  outpatient therapy and required hospitalization.  Her chest x-ray then  did not show any infiltrates.  An arterial blood gas reportedly did not  show hypercarbia.  Maria Nelson was not documented to be hypoxemic.  Maria Nelson had an  elevated IGE level of 2664 and due to a presumed allergic component, Maria Nelson  was started on Singulair.  Maria Nelson has been maintained on a regimen of  Advair, Singulair, Spiriva since discharge, and her dyspnea has  improved.  However, Maria Nelson has gained about 30 pounds after the steroid  courses.  Maria Nelson used to smoke about one-third pack per day but has quit  since March.  Maria Nelson currently denies cough, sputum production, wheezing,  and reports mild dyspnea on exertion.  Maria Nelson denies chest pain,  palpitations, orthopnea, paroxysmal nocturnal dyspnea.   Past medical history includes asthma for the past few months.  Past  medical history also includes peptic ulcer disease.   PAST SURGICAL HISTORY:  Tubal ligation 27 years ago.   ALLERGIES:  ASPIRIN intolerant.   CURRENT MEDICATIONS:  1. Singulair 10 mg p.o. daily.  2. Nasonex 2 puffs in each naris daily.  3. Advair 250/50 1 puff b.i.d.  4. Spiriva inhaler daily.  5. Protonix 40 mg p.o. daily.   SOCIAL HISTORY:  Maria Nelson smoked about 12 pack years and quit in March of  this year.  Maria Nelson is now a social drinker.  Maria Nelson is single and works as a  Conservation officer, nature in [employer] and lives with her mother.   FAMILY HISTORY:  Heart disease in her mother.  Sister had breast cancer.   REVIEW OF SYSTEMS:  Reports occasional pedal edema and dyspnea on  exertion with activity.  Denies wheezing.   PHYSICAL EXAMINATION:  VITAL SIGNS:  Weight 192 pounds.  Blood pressure  124/88.  Afebrile.  Heart rate 68.  Oxygen saturation 98% on room air.  HEENT:  Nasopharyngeal space is supple.  No JVD.  No postnasal drip.  NECK:  Supple.  No JVD.  No lymphadenopathy.  CVS:  S1 and S2 is normal.  CHEST:  Clear to auscultation.  ABDOMEN:  Soft, nontender.  NEUROLOGIC:  Nonfocal.  EXTREMITIES:  No edema.   IMPRESSION:  1. Likely chronic obstructive pulmonary disease in this 55 year old ex-      smoker.  2. A component of asthma is suspected, but I doubt new onset of asthma      at her age.   RECOMMENDATIONS:  1. Spirometry pre and post will be scheduled.  2. Maria Nelson will continue her regimen of Advair, Spiriva, and Singulair.      We will look for reversibility and estimate her lung function.  3. Clearly smoking cessation is paramount.  Maria Nelson will need support      during this period of smoking cessation to prevent a relapse.    Sincerely,      Oretha Milch, MD  Electronically Signed    RVA/MedQ  DD: 10/25/2006  DT: 10/26/2006  Job #: [id number]

## 2010-09-01 NOTE — Discharge Summary (Signed)
Maria Nelson, Maria Nelson NO.:  [id number]   MEDICAL RECORD NO.:  [id number]          PATIENT TYPE:  INP   LOCATION:  5729                         FACILITY:  [facility]   PHYSICIAN:  Fransisco Hertz, M.D.  DATE OF BIRTH:  1955/07/10   DATE OF ADMISSION:  01/14/2007  DATE OF DISCHARGE:  01/17/2007                               DISCHARGE SUMMARY   PRIMARY CARE PHYSICIAN:  Dr. Hollace Hayward   DISCHARGE DIAGNOSES:  1. Chronic obstructive pulmonary disease exacerbation.  2. Tobacco abuse.  3. Peptic ulcer disease.  4. Anemia.   DISCHARGE MEDICATIONS:  1. Albuterol MDI two puffs q.4-6 hours as needed.  2. Doxycycline 100 mg one tablet twice a day for four days.  3. Advair 250/50 one puff twice a day.  4. Singulair 10 mg daily.  5. Protonix 40 mg daily.  6. Prednisone taper 60 mg for one day, then 50 mg for one day, then 40      mg for one day, then 30 mg for one day, then 20 mg for one day,      then 10 mg for one day and stop.  7. Tessalon 100 mg one tablet three times a day.   DISPOSITION AND FOLLOWUP:  Patient is to follow up at [facility] with Dr. Rufina Falco on January 31, 2007 at  9:45 in the morning.  At the followup visit, patient needs to be  reviewed for resolution of her exacerbation of shortness of breath.  Patient also has had a very high white cell count after initiation of  prednisone.  This will have to be followed on outpatient basis.  An  appointment has been given for the patient to come to the outpatient  clinic on October 7 to have her CBCD measured to check her white cell  count and this will have to be followed on October 14 too.  Patient also  has problem of tobacco abuse; the patient seems highly motivated to stop  smoking.  This is to be followed up on an outpatient basis.  Also,  patient was noted to have anemia during hospital admission; this is to  be followed up on outpatient basis, and also a colonoscopy could  be  considered for the same purpose.   PROCEDURES:  Chest x-ray January 15, 2007:  The patient is stable from  an vascular and __________ markings, but no focal disease.   CONSULTATIONS:  No consultations were done during this hospital  admission.   BRIEF ADMISSION HISTORY:  Ms. Stemmler is a 55 year old lady with past  medical history of COPD/asthma and PUD,  who came to the ED because of a  cough.  One day prior to admission, she started feeling malaise, fever  and chills.  She stayed in bed all day.  On the day of admission, she  started feeling chest tightness with cough that was productive.  The  cough got worse over the day and she decided to come to ED.  She had  only been taking Spiriva and was not taking her albuterol and Advair.  She did not give any history  of sick contacts; there is no history of  traveling or diarrhea.   ADMISSION PHYSICAL:  VITAL SIGNS:  Temperature 98.6, blood pressure  134/86, pulse 85, respiratory rate 24, O2 sat of 93% on two liters.  GENERAL:  Lying in bed, able to speak in complete sentences.  EYES:  Anicteric.  No pallor.  ENT:  Moist mucous membranes.  NECK:  Supple.  No thyromegaly.  RESPIRATORY:  Good air entry.  Some end-expiratory wheezing, no cough.  Light crackles on right lower lobes.  CARDIOVASCULAR:  Regular rate and rhythm with normal first and second  heart sounds.  GI:  Positive bowel sounds.  Nontender, nondistended.  EXTREMITIES:  Positive pulses.  No edema.  SKIN:  No rash.  Normal turgor.  LYMPH:  No lymphadenopathy.  NEURO:  Nonfocal.   ADMISSION LABS:  Sodium 135, potassium 3.2, chloride 103, bicarbonate  22.  BUN 7, creatinine 0.9, hemoglobin 13, white cells 6.2, platelets  257.  ALT 5.  RDW 15.3.   HOSPITAL COURSE:  1. Shortness of breath, most likely secondary to exacerbation of      asthma/chronic obstructive pulmonary disease.  She was admitted for      observation and was given regular nebulizations and also  was      started on course of steroid.  We also started her on a course of      doxycycline.  She responded well to this treatment measure and was      able to move about without feeling any difficulty in breathing from      day two of admission.  2. Tobacco abuse.  Patient continues to smoke despite having recurrent      problems with her difficulty in breathing.  She was strongly      encouraged to stop smoking to which she remains positive and needs      to be followed up on outpatient basis.  3. Peptic ulcer disease.  Patient did not have complaint of abdominal      pain and we basically continued her proton pump inhibitor during      hospital admission.  4. Low hemoglobin.  Patient was noticed to have a low hemoglobin      compared to her previous value.  However, her hemoglobin remained      stable during hospital admission.  She had a ferritin of 76 with      low iron and elevated TIBC, most consistent with iron deficiency      anemia.  Patient needs a followup in this regard on outpatient      basis and would probably benefit from a colonoscopy.   DISCHARGE DAY LABS:  White cell count 20.9, hemoglobin 13.2, platelets  243, sodium 139, potassium 4.1, chloride 108, bicarbonate 23, BUN 13,  creatinine 0.9.  Blood glucose 96.   DISCHARGE DAY VITALS:  Temperature 98.1, pulse 71, sats 95% on room air,  blood pressure 118/77, respiratory rate 20 per minute.      Zara Council, MD       Fransisco Hertz, M.D.     AS/MEDQ  D:  01/30/2007  T:  01/30/2007  Job:  [id number]

## 2010-09-02 ENCOUNTER — Ambulatory Visit (INDEPENDENT_AMBULATORY_CARE_PROVIDER_SITE_OTHER): Payer: Self-pay | Admitting: Ophthalmology

## 2010-09-02 ENCOUNTER — Other Ambulatory Visit (HOSPITAL_COMMUNITY)
Admission: RE | Admit: 2010-09-02 | Discharge: 2010-09-02 | Disposition: A | Discharge status: Home / Self Care | Payer: Self-pay | Source: Ambulatory Visit | Attending: Internal Medicine | Admitting: Internal Medicine

## 2010-09-02 ENCOUNTER — Encounter: Payer: Self-pay | Admitting: Ophthalmology

## 2010-09-02 DIAGNOSIS — G4730 Sleep apnea, unspecified: Secondary | ICD-10-CM

## 2010-09-02 DIAGNOSIS — Z01419 Encounter for gynecological examination (general) (routine) without abnormal findings: Secondary | ICD-10-CM | POA: Insufficient documentation

## 2010-09-02 DIAGNOSIS — M25519 Pain in unspecified shoulder: Secondary | ICD-10-CM | POA: Insufficient documentation

## 2010-09-02 DIAGNOSIS — G4733 Obstructive sleep apnea (adult) (pediatric): Secondary | ICD-10-CM | POA: Insufficient documentation

## 2010-09-02 DIAGNOSIS — Z0000 Encounter for general adult medical examination without abnormal findings: Secondary | ICD-10-CM

## 2010-09-02 DIAGNOSIS — J449 Chronic obstructive pulmonary disease, unspecified: Secondary | ICD-10-CM

## 2010-09-02 DIAGNOSIS — T783XXA Angioneurotic edema, initial encounter: Secondary | ICD-10-CM

## 2010-09-02 MED ORDER — MELOXICAM 7.5 MG PO TABS: 7.5000 mg | ORAL_TABLET | Freq: Every day | ORAL | Status: DC

## 2010-09-02 MED ORDER — FLUTICASONE-SALMETEROL 500-50 MCG/DOSE IN AEPB: 1.0000 | INHALATION_SPRAY | Freq: Two times a day (BID) | RESPIRATORY_TRACT | Status: DC

## 2010-09-02 NOTE — Progress Notes (Signed)
Addended by: Sinda Du on: 09/02/2010 03:33 PM   Modules accepted: Orders

## 2010-09-02 NOTE — Assessment & Plan Note (Signed)
>>  ASSESSMENT AND PLAN FOR URTICARIA/ANGIOEDEMA WRITTEN ON 09/02/2010  2:40 PM BY BOWEN, BRADLEY  The patient has had no further episodes, and is scheduled to see her allergist at [facility] in July.

## 2010-09-02 NOTE — Assessment & Plan Note (Signed)
I did perform a Pap smear today.

## 2010-09-02 NOTE — Assessment & Plan Note (Signed)
The patient describes left-sided shoulder pain that is chronic in nature, there is no pain with passive motion of the joint. The patient has been taking large amounts of ibuprofen for this pain, and I will change her to Mobic today. There are no red flag symptoms.
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
## 2010-09-02 NOTE — Assessment & Plan Note (Signed)
There appears to be a discrepancy in the patient's symptoms, and her physical exam findings. The patient currently is satting well at 96% on room air, she has not had her albuterol inhaler in 6 hours, but states that she has been using her albuterol every 4 hours. The patient's physical exam does not demonstrate any significant wheezes or other concerning signs. Given her shortness of breath, I will increase her Advair today to the 500 dose given that she also has a history of asthma. I do not feel that the patient needs prednisone at this time, but I recommended that if her symptoms not improved, that she return to clinic for further evaluation

## 2010-09-02 NOTE — Assessment & Plan Note (Signed)
The patient relates episodes of waking up with a sensation of gasping for breath at night. This is most consistent with sleep apnea and I will order a sleep study today.

## 2010-09-02 NOTE — Assessment & Plan Note (Signed)
The patient has had no further episodes, and is scheduled to see her allergist at [facility] in July.

## 2010-09-02 NOTE — Patient Instructions (Signed)
Please return in one month for reevaluation of your COPD. I would like to see you after your visit at [facility]. These call the clinic or go to the ED if your shortness of breath worsens

## 2010-09-04 NOTE — Discharge Summary (Signed)
NAMEJAVANNA, PATIN NO.:  [id number]   MEDICAL RECORD NO.:  [id number]          PATIENT TYPE:  INP   LOCATION:  6743                         FACILITY:  [facility]   PHYSICIAN:  Duncan Dull, M.D.     DATE OF BIRTH:  01/12/1956   DATE OF ADMISSION:  08/18/2006  DATE OF DISCHARGE:  08/21/2006                               DISCHARGE SUMMARY   DISCHARGE DIAGNOSES:  1. Asthmatic bronchitis.  2. Peptic ulcer disease.  3. COPD (chronic obstructive pulmonary disease).  4. Tobacco abuse.   DISCHARGE MEDICATIONS:  1. Singulair 10 mg 1 tablet daily.  2. Nasonex 2 puffs in each nostril daily.  3. Advair 250/50 one inhalation b.i.d.  4. Spiriva 18 mcg 1 inhalation daily.  5. Protonix 40 mg p.o. daily.  6. Prednisone taper 60 mg, 50 mg, 40 mg, 30 mg, 20 mg, 10 mg over six      days.  7. Albuterol inhaler 1 to 2 puffs q.2 h p.r.n. for wheezing.   DISPOSITION AND FOLLOW-UP:  Maria Nelson was discharged from the  hospital in stable and improved condition.  She has an appointment to  see Dr. Robb Matar in the [facility] on Aug 23, 2006 at  11:00 a.m.[name]  At the time of follow-up, she will need her respiratory  status followed up.  She also needs education on asthma control as well  as education on the use of her inhalers.   BRIEF ADMITTING H&P:  ADMISSION VITAL SIGNS: Temperature 97.2, blood  pressure 120/80, pulse 90 pressure 822, O2 saturations 94% on room air.   ADMISSION LABORATORIES:  WBCs 8.0, hemoglobin 13.1, platelets 274,000,  hematocrit 40.2, sodium 137, potassium 3.8, chloride 104, bicarbonate  26, BUN 11, creatinine 0.9, glucose 144, IgE 2664.  Admission ABG  7.4/45/75/27.  Chest x-ray showed peribronchial thickening.   HOSPITAL COURSE BY PROBLEMS:  1. Acute dyspnea, probable asthmatic bronchitis with underlying COPD:      Maria Nelson had failed attempts to treat her respiratory      symtpoms as an outpatient, so she was admitted for IV  steroids,      bronchodilators, empiric antiobiotics and pulmonary toilet.  Sputum      samples were obtained and were negative.  Chest x-ray did showed      peribronchial thickening but no evidence of infiltrates.  She did      not have a leukocytosis.  She was afebrile.  Her antibiotics were      at that time discontinued.  Her O2 saturations remained stable with      consistently normal O2 saturations greater than 95%.  Upon      ambulation, her O2 saturations were greater than 90%.  Her ABGs      were also normal and did not show a concerning hypercarbia.  She      will be discharged with a long-acting beta agonist and steroid-      combination inhaler as well as a rescue inhaler.  Of particular      note, she had extremely elevated IgE representing a possible      allergic component  to her acute presentation.  She was started on      Singulair and will need to continue this in the outpatient setting.      It is likely that seasonal allergies triggered this acute      exacerbation.  2. History of peptic ulcer disease:  Maria Nelson has a history of      upper GI bleed secondary to peptic ulcer disease in the past      requiring blood transfusion with H. pylori-positive biopsies.  This      was stable throughout her hospitalization.  There was no evidence      of any anemia.  She was continued on her Protonix.  3. Leukocytosis secondary to the initiation of IV steroids:  Ms.      Nelson did have a normal white blood cell count on admission of      8.0.  With the initiation of IV steroids, her white blood cells did      increase to 22.5.  This will be followed in the outpatient setting      for a decline after the discontinuation of steroid treatment.   DISCHARGE LABORATORY DATA:  WBCs 22.5, hemoglobin 12.3, hematocrit 37.6,  sodium 139, potassium 4.2, chloride 105, bicarbonate 27, BUN 17,  creatinine 0.93, glucose 93, hemoglobin A1c 6.2.  Serum IgE 2664.   DISCHARGE VITAL  SIGNS:  T-max 97.6, blood pressure 117/81, pulse 70,  respiratory rate 20.  CBG 155.      Edsel Petrin, D.O.  Electronically Signed      Duncan Dull, M.D.  Electronically Signed    ELG/MEDQ  D:  09/17/2006  T:  09/17/2006  Job:  [id number]

## 2010-09-04 NOTE — Group Therapy Note (Signed)
NAME:  Maria Nelson, Maria Nelson NO.:  [id number]   MEDICAL RECORD NO.:  [id number]                   PATIENT TYPE:  OUT   LOCATION:  [facility]                           FACILITY:  [facility]   PHYSICIAN:  Argentina Donovan, MD                     DATE OF BIRTH:  1955-05-08   DATE OF SERVICE:  04/09/2003                                    CLINIC NOTE   REASON FOR VISIT:  The patient is a 55 year old gravida 4, para 3-0-1-3 who  had a tubal ligation many years ago and has not had a Pap smear nor a  mammogram in many years.  Was in to the [facility] because of some dysfunctional  uterine bleeding which stopped shortly after she was there.  She was treated  for Trichomonas and then called in to call back to be treated for active GC.  She came in for a routine annual check up today.   PHYSICAL EXAMINATION:  BREASTS:  She had large pendulous breasts with no  dominant masses and no nipple discharge.  ABDOMEN:  Soft, flat, nontender, no masses, no organomegaly.  NECK:  Thyroid is normal with no masses.  PELVIC:  External genitalia is normal.  BUS within normal limits.  Vagina is  clean and well rugated.  Cervix is clean and parous and the uterus anterior  and normal in size, shape, and consistency.  Adnexa is normal.   The Pap smear was taken and also we screened for Chlamydia and gonorrhea and  the patient will be referred for a screening mammogram.                                               Argentina Donovan, MD    PR/MEDQ  D:  04/09/2003  T:  04/09/2003  Job:  [id number]

## 2010-09-04 NOTE — Discharge Summary (Signed)
NAMEMAKARIA, POARCH NO.:  [id number]   MEDICAL RECORD NO.:  [id number]          PATIENT TYPE:  INP   LOCATION:  4708                         FACILITY:  [facility]   PHYSICIAN:  Chauncey Reading, D.O.  DATE OF BIRTH:  Aug 18, 1955   DATE OF ADMISSION:  01/21/2008  DATE OF DISCHARGE:  01/23/2008                               DISCHARGE SUMMARY   DISCHARGE DIAGNOSES:  1. Chronic obstructive pulmonary disease exacerbation.  2. Bacterial vaginosis.  3. Tobacco abuse.   DISCHARGE MEDICATIONS:  1. Tussionex 5 mL per mouth q.12 h.  2. Nicotine patches.  3. Doxycycline 100 mg 1 tablet b.i.d.  4. Spiriva 1 capsule daily.  5. Flagyl 500 mg by mouth b.i.d.  6. Protonix 40 mg daily.  7. Albuterol 2.5 inhaler q.4 h.  8. Prednisone taper.  9. Advair 250/50 mcg b.i.d.  10.Singulair 10 mg daily.  11.Tamiflu 75 mg 1 pill b.i.d. for two more days.   BRIEF ADMITTING HISTORY AND PHYSICAL:  This is a 55 year old with past  medical history of COPD who comes in because she started having  subjective fevers, chills, and productive cough over the past 2-3 days,  also was complaining of some shortness of breath on exertion that it was  eventually getting worse.  Her sputum was productive with no blood.  She  has not taken anything for her symptoms.  She relates no sick contacts.  She relates no chest pain or pleuritic chest pain.   PHYSICAL EXAMINATION:  VITAL SIGNS:  Her temperature is 101.2, blood  pressure 114/76, pulse 97, respirations 16.  She was sating 96% room  air.  GENERAL APPEARANCE:  She is somnolent, but responsive.  HEENT:  Moist mucous membrane.  NECK:  No bruits.  RESPIRATION:  An expiratory wheezing bilateral with decreased air  movement, but no crackles.  CARDIOVASCULAR:  Tachycardic, but regular rate and rhythm.  ABDOMEN:  Some epigastric discomfort, but no hepatomegaly or distention.  Soft.  Positive bowel sounds.  GYN:  External vagina grossly within normal  limits with some whitish  discharge.  NEUROLOGIC:  Completely intact.   ADMISSION LABS:  On admission, her labs were sodium 138, potassium 4.2,  chloride 104, bicarb 24, BUN of 8, creatinine 1.1, glucose 93, D-dimer  0.29.  UA was clear.  ABGs showed pH of 7.37, pCO2 of 39, and pO2 of 64.  White count 6.1, hemoglobin 13.8 with an MCV of 82 and ANC of 51, and  platelets of 194.   HOSPITAL COURSE:  1. COPD exacerbation.  The patient was admitted to the regular floor      and she was started on albuterol and Atrovent.  She was also given      Tamiflu, doxycycline, and steroids.  By the second day, her      symptoms improved significantly.  So her medications were changed      by the second day to p.o. and she was tolerating those well.  She      was ambulating without desating.  2. Bacterial vaginosis.  The patient was restarted on Flagyl.  This      will be followed  up at the outpatient clinic.  3. Tobacco abuse.  The patient was counseled about this, thus she has      chronic obstructive pulmonary disease and continues to smoke.  She      was started on nicotine patch, she was started in the hospital and      she was sent out on a prescription.  She has been advised of the      consequences and deleterious effects tobacco has on her health.   On the day of discharge, her white count was 7.7, hemoglobin 13.2,  platelets 182, ANC 7.3, MCV 82.  Sodium 137, potassium 3.8, chloride  106, bicarb 24, BUN 5, creatinine 0.7, glucose 108.  Total bilirubin  0.6, alkaline phosphatase 92, AST 27, ALT 14, protein 6.9, albumin 3.4,  and calcium 9.0.  The blood cultures were negative and sputum cultures  were negative.      Marinda Elk, M.D.  Electronically Signed      Chauncey Reading, D.O.  Electronically Signed    AF/MEDQ  D:  03/19/2008  T:  03/20/2008  Job:  [id number]

## 2010-09-08 ENCOUNTER — Other Ambulatory Visit: Payer: Self-pay | Admitting: *Deleted

## 2010-09-09 MED ORDER — PANTOPRAZOLE SODIUM 40 MG PO TBEC: 40.0000 mg | DELAYED_RELEASE_TABLET | Freq: Every day | ORAL | Status: DC

## 2010-09-09 NOTE — Telephone Encounter (Signed)
Called to pharm 

## 2010-09-28 ENCOUNTER — Ambulatory Visit (HOSPITAL_BASED_OUTPATIENT_CLINIC_OR_DEPARTMENT_OTHER): Payer: Self-pay | Attending: Ophthalmology

## 2010-09-28 DIAGNOSIS — G4733 Obstructive sleep apnea (adult) (pediatric): Secondary | ICD-10-CM | POA: Insufficient documentation

## 2010-09-30 ENCOUNTER — Ambulatory Visit (INDEPENDENT_AMBULATORY_CARE_PROVIDER_SITE_OTHER): Payer: Self-pay | Admitting: Ophthalmology

## 2010-09-30 ENCOUNTER — Encounter: Payer: Self-pay | Admitting: Ophthalmology

## 2010-09-30 DIAGNOSIS — G4730 Sleep apnea, unspecified: Secondary | ICD-10-CM

## 2010-09-30 DIAGNOSIS — J449 Chronic obstructive pulmonary disease, unspecified: Secondary | ICD-10-CM

## 2010-09-30 DIAGNOSIS — T783XXA Angioneurotic edema, initial encounter: Secondary | ICD-10-CM

## 2010-09-30 MED ORDER — CETIRIZINE HCL 10 MG PO TABS: 10.0000 mg | ORAL_TABLET | Freq: Every day | ORAL | Status: DC

## 2010-09-30 NOTE — Assessment & Plan Note (Signed)
Stable the patient has no further recurrence, I recommended that she always keep an EpiPen with her, and continue her Zyrtec used daily. I refilled this today.

## 2010-09-30 NOTE — Assessment & Plan Note (Signed)
>>  ASSESSMENT AND PLAN FOR URTICARIA/ANGIOEDEMA WRITTEN ON 09/30/2010  2:12 PM BY BOWEN, BRADLEY  Stable the patient has no further recurrence, I recommended that she always keep an EpiPen with her, and continue her Zyrtec used daily. I refilled this today.

## 2010-09-30 NOTE — Patient Instructions (Signed)
Please return in 3 months to meet your new doctor. If you have problems sooner please contact the clinic. Always keep an EpiPen in her purse.

## 2010-09-30 NOTE — Assessment & Plan Note (Signed)
I am waiting the results of her sleep study and informed her that we would start CPAP if needed.

## 2010-09-30 NOTE — Assessment & Plan Note (Signed)
This is stable, I have recommended that the patient cut back on her albuterol use if possible, and to contact the clinic if she has any worsening of her symptoms. The patient's respiratory status today is at her baseline, and I recommended that she use her increased dose of Advair which she is waiting to receive in the mail. I recommend that she be reassessed after starting this new medication. I do not feel that the patient needs a prednisone taper currently.

## 2010-09-30 NOTE — Progress Notes (Signed)
Subjective:    Patient ID: Maria Nelson, female    DOB: January 09, 1956, 55 y.o.   MRN: [id number]  HPI This is a 55 year old female with a past medical history significant for COPD, and angioedema who presents for routine followup. The patient's primary concern is her last visit with worsening COPD and I increased her Advair to 500 twice a day. The patient has not received this medication yet, but says her shortness of breath is stable to improved. The patient continues to use albuterol 4 times a day, and her Advair twice a day. The patient has had her sleep study, but the results of this test has not been released. With regards to her smoking, the patient states that she has decreased her cigarette use to 2 cigarettes daily have recommended that she continue to try to cut back if possible. The patient has no other complaints today, denies any new shortness of breath, chest pain, change in her bowel movements, fevers or chills.   Review of Systems  Constitutional: Negative for fever and chills.  Respiratory: Negative for cough and shortness of breath.   Cardiovascular: Negative for chest pain and palpitations.  Gastrointestinal: Negative for vomiting, diarrhea and constipation.       Objective:   Physical Exam  Constitutional: She appears well-developed and well-nourished.  HENT:  Head: Normocephalic and atraumatic.  Eyes: Pupils are equal, round, and reactive to light.  Cardiovascular: Normal rate, regular rhythm and intact distal pulses.  Exam reveals no gallop and no friction rub.   No murmur heard. Pulmonary/Chest: Effort normal and breath sounds normal. She has no wheezes. She has no rales.  Abdominal: Soft. Bowel sounds are normal. She exhibits no distension. There is no tenderness.  Musculoskeletal: Normal range of motion.  Neurological: She is alert. No cranial nerve deficit.  Skin: No rash noted.       Current Outpatient Prescriptions on File Prior to Visit  Medication  Sig Dispense Refill  . albuterol (PROVENTIL,VENTOLIN) 90 MCG/ACT inhaler Inhale 2 puffs into the lungs every 4 (four) hours as needed.  17 g  3  . diphenhydrAMINE (SOMINEX) 25 MG tablet Take 25 mg by mouth every 4 (four) hours as needed. For swelling       . EPINEPHrine (EPIPEN JR) 0.15 MG/0.3ML injection Inject 0.15 mg into the muscle as needed.        [name] EPINEPHrine (EPIPEN) 0.3 mg/0.3 mL DEVI Inject 0.3 mg into the muscle once.        . Fluticasone-Salmeterol (ADVAIR DISKUS) 500-50 MCG/DOSE AEPB Inhale 1 puff into the lungs 2 (two) times daily.  1 each  6  . hydrocortisone 1 % cream Apply topically 3 (three) times daily. Apply to the affected area 3-4 times daily as needed.       . meloxicam (MOBIC) 7.5 MG tablet Take 1 tablet (7.5 mg total) by mouth daily.  30 tablet  2  . montelukast (SINGULAIR) 10 MG tablet Take 1 tablet (10 mg total) by mouth at bedtime.  90 tablet  3  . pantoprazole (PROTONIX) 40 MG tablet Take 1 tablet (40 mg total) by mouth daily.  90 tablet  2  . DISCONTD: cetirizine (ZYRTEC) 10 MG tablet Take 10 mg by mouth daily.          Past Medical History  Diagnosis Date  . COPD (chronic obstructive pulmonary disease)     secondary to tobacco use  . Gastrointestinal hemorrhage     secondary to PUD on March 2008  .  Peptic ulcer disease     +H. pylori (antigen)- Dr. Juanda Chance- treated  . ETOH abuse     Pt stopped in 3/08  . Tobacco abuse     Pt stopped in 3/08      Assessment & Plan:

## 2010-10-03 DIAGNOSIS — G4733 Obstructive sleep apnea (adult) (pediatric): Secondary | ICD-10-CM

## 2010-10-03 NOTE — Procedures (Signed)
NAME:  Maria Nelson, Maria Nelson NO.:  [id number]  MEDICAL RECORD NO.:  [id number]          PATIENT TYPE:  OUT  LOCATION:  SLEEP CENTER                 FACILITY:  [facility]  PHYSICIAN:  Clinton D. Maple Hudson, MD, FCCP, FACPDATE OF BIRTH:  07/05/1955  DATE OF STUDY:  09/28/2010                           NOCTURNAL POLYSOMNOGRAM  REFERRING PHYSICIAN:  Sinda Du, MD  REFERRING PHYSICIAN:  Sinda Du, MD  INDICATIONS FOR STUDY:  Hypersomnia with sleep apnea.  EPWORTH SLEEPINESS SCORE:  12/24, BMI 41.  Weight 210 pounds, height 60 inches.  Neck 13 inches.  HOME MEDICATIONS:  Charted and reviewed.  SLEEP ARCHITECTURE:  Total sleep time 149.5 minutes with sleep efficiency 42.8%.  Stage I was 15.1%, stage II 65.6%, stage III 1%, REM 18.4% of total sleep time.  Sleep latency 73 minutes, REM latency 36 minutes, awake after sleep onset 45.5 minutes, arousal index 42.5.  BEDTIME MEDICATION:  Advair.  RESPIRATORY DATA:  Apnea/hypopnea index (AHI) 44.5 per hour.  A total of 111 events was scored including 6 obstructive apneas, 10 central apneas, 1 mixed apnea, 94 hypopneas.  Events were associated with non-supine sleep and REM.  REM AHI 76.4 per hour.  Sleep onset was delayed until after midnight and the patient did not achieve enough early sleep in the first hours of the study to meet requirements for application of CPAP titration by split protocol on the study night.  OXYGEN DATA:  Moderately loud snoring with oxygen desaturation to a nadir of 80% and a mean oxygen saturation through the study of 90.2% on room air.  A total of 9.9 minutes was recorded during the total recording time, with saturation of 88% or less on room air.  CARDIAC DATA:  Normal sinus rhythm.  MOVEMENT/PARASOMNIA:  No significant movement disturbance.  Bathroom x1.  IMPRESSION/RECOMMENDATIONS: 1. Severe obstructive sleep apnea/hypopnea syndrome, AHI index 44.5     per hour with supine events especially in  common in REM (REM AHI of     76.4 per hour).  Moderately loud snoring with oxygen desaturation     to a nadir of 80% and a mean oxygen saturation through the study of     90.2% on room air. 2. Sleep was limited with onset after midnight and spontaneously awake     after 3:30 a.m.  Insufficient time was available to meet     requirements for initiation of CPAP titration by split protocol on     the study night.  Consider     return for CPAP titration or evaluate for alternative management as     clinically indicated.  If she returns for CPAP trial, consider     bringing a sleep medication to aid in sleep consolidation during     the study.     Clinton D. Maple Hudson, MD, [facility], FACP Diplomate, Biomedical engineer of Sleep Medicine Electronically Signed    CDY/MEDQ  D:  10/03/2010 08:59:58  T:  10/03/2010 11:05:38  Job:  [id number]

## 2010-10-20 ENCOUNTER — Other Ambulatory Visit: Payer: Self-pay | Admitting: *Deleted

## 2010-10-20 MED ORDER — DESLORATADINE 5 MG PO TABS: 5.0000 mg | ORAL_TABLET | Freq: Every day | ORAL | Status: DC

## 2010-10-20 NOTE — Telephone Encounter (Signed)
Clarinex rx faxed to [facility] pharmacy. 

## 2010-10-20 NOTE — Telephone Encounter (Signed)
Zyrtec is no longer available at the [facility] per pharmacy; can it be changed to Clarinex?  Thanks

## 2010-10-20 NOTE — Telephone Encounter (Signed)
I discontinued Zyrtec and prescribed Clarinex 5 mg daily.

## 2010-12-08 ENCOUNTER — Ambulatory Visit (INDEPENDENT_AMBULATORY_CARE_PROVIDER_SITE_OTHER): Payer: Self-pay

## 2010-12-08 ENCOUNTER — Inpatient Hospital Stay (INDEPENDENT_AMBULATORY_CARE_PROVIDER_SITE_OTHER)
Admission: RE | Admit: 2010-12-08 | Discharge: 2010-12-08 | Disposition: A | Discharge status: Home / Self Care | Payer: Self-pay | Source: Ambulatory Visit | Attending: Emergency Medicine | Admitting: Emergency Medicine

## 2010-12-08 DIAGNOSIS — R071 Chest pain on breathing: Secondary | ICD-10-CM

## 2010-12-08 DIAGNOSIS — J45909 Unspecified asthma, uncomplicated: Secondary | ICD-10-CM

## 2010-12-10 ENCOUNTER — Inpatient Hospital Stay (HOSPITAL_COMMUNITY)
Admission: EM | Admit: 2010-12-10 | Discharge: 2010-12-13 | DRG: 192 | Disposition: A | Discharge status: Home / Self Care | Payer: Self-pay | Attending: Internal Medicine | Admitting: Internal Medicine

## 2010-12-10 ENCOUNTER — Emergency Department (HOSPITAL_COMMUNITY): Payer: Self-pay

## 2010-12-10 DIAGNOSIS — J441 Chronic obstructive pulmonary disease with (acute) exacerbation: Secondary | ICD-10-CM

## 2010-12-10 DIAGNOSIS — R7309 Other abnormal glucose: Secondary | ICD-10-CM | POA: Diagnosis present

## 2010-12-10 DIAGNOSIS — K279 Peptic ulcer, site unspecified, unspecified as acute or chronic, without hemorrhage or perforation: Secondary | ICD-10-CM | POA: Diagnosis present

## 2010-12-10 DIAGNOSIS — G4733 Obstructive sleep apnea (adult) (pediatric): Secondary | ICD-10-CM | POA: Diagnosis present

## 2010-12-10 DIAGNOSIS — F17200 Nicotine dependence, unspecified, uncomplicated: Secondary | ICD-10-CM | POA: Diagnosis present

## 2010-12-10 LAB — CBC
Hemoglobin: 15.4 g/dL — ABNORMAL HIGH (ref 12.0–15.0)
MCH: 27.4 pg (ref 26.0–34.0)
MCHC: 34.1 g/dL (ref 30.0–36.0)
Platelets: 220 10*3/uL (ref 150–400)

## 2010-12-10 LAB — URINALYSIS, ROUTINE W REFLEX MICROSCOPIC
Bilirubin Urine: NEGATIVE
Glucose, UA: NEGATIVE mg/dL
Hgb urine dipstick: NEGATIVE
Ketones, ur: NEGATIVE mg/dL
Nitrite: NEGATIVE
Specific Gravity, Urine: 1.016 (ref 1.005–1.030)
pH: 5.5 (ref 5.0–8.0)

## 2010-12-10 LAB — COMPREHENSIVE METABOLIC PANEL
ALT: 15 U/L (ref 0–35)
AST: 22 U/L (ref 0–37)
Albumin: 3.9 g/dL (ref 3.5–5.2)
Alkaline Phosphatase: 107 U/L (ref 39–117)
Calcium: 10 mg/dL (ref 8.4–10.5)
GFR calc Af Amer: >60 mL/min (ref >60)
Potassium: 3.9 mEq/L (ref 3.5–5.1)
Sodium: 141 mEq/L (ref 135–145)
Total Protein: 7.9 g/dL (ref 6.0–8.3)

## 2010-12-10 LAB — DIFFERENTIAL
Basophils Absolute: 0 10*3/uL (ref 0.0–0.1)
Basophils Relative: 1 % (ref 0–1)
Eosinophils Absolute: 1 10*3/uL — ABNORMAL HIGH (ref 0.0–0.7)
Monocytes Absolute: 0.7 10*3/uL (ref 0.1–1.0)
Monocytes Relative: 8 % (ref 3–12)
Neutro Abs: 4 10*3/uL (ref 1.7–7.7)
Neutrophils Relative %: 47 % (ref 43–77)

## 2010-12-11 ENCOUNTER — Encounter: Payer: Self-pay | Admitting: Ophthalmology

## 2010-12-11 ENCOUNTER — Observation Stay (HOSPITAL_COMMUNITY): Payer: Self-pay

## 2010-12-11 LAB — BLOOD GAS, ARTERIAL
Acid-Base Excess: 0.2 mmol/L (ref 0.0–2.0)
O2 Saturation: 96.1 %
TCO2: 26 mmol/L (ref 0–100)
pCO2 arterial: 43.2 mmHg (ref 35.0–45.0)

## 2010-12-11 LAB — GLUCOSE, CAPILLARY
Glucose-Capillary: 145 mg/dL — ABNORMAL HIGH (ref 70–99)
Glucose-Capillary: 150 mg/dL — ABNORMAL HIGH (ref 70–99)
Glucose-Capillary: 116 mg/dL — ABNORMAL HIGH (ref 70–99)

## 2010-12-11 LAB — CBC
HCT: 46.3 % — ABNORMAL HIGH (ref 36.0–46.0)
Hemoglobin: 15.6 g/dL — ABNORMAL HIGH (ref 12.0–15.0)
MCHC: 33.7 g/dL (ref 30.0–36.0)
MCV: 79.8 fL (ref 78.0–100.0)
WBC: 7.4 10*3/uL (ref 4.0–10.5)

## 2010-12-11 LAB — CK TOTAL AND CKMB (NOT AT ARMC)
CK, MB: 5.7 ng/mL — ABNORMAL HIGH (ref 0.3–4.0)
Relative Index: 2.4 (ref 0.0–2.5)

## 2010-12-11 LAB — CARDIAC PANEL(CRET KIN+CKTOT+MB+TROPI)
CK, MB: 6.8 ng/mL (ref 0.3–4.0)
Troponin I: <0.3 ng/mL (ref <0.30)

## 2010-12-11 LAB — ETHANOL: Alcohol, Ethyl (B): <11 mg/dL (ref 0–11)

## 2010-12-11 LAB — RAPID URINE DRUG SCREEN, HOSP PERFORMED
Cocaine: NOT DETECTED
Opiates: NOT DETECTED

## 2010-12-11 LAB — BASIC METABOLIC PANEL
Calcium: 10.5 mg/dL (ref 8.4–10.5)
GFR calc non Af Amer: >60 mL/min (ref >60)
Sodium: 139 mEq/L (ref 135–145)

## 2010-12-11 LAB — TROPONIN I: Troponin I: <0.3 ng/mL (ref <0.30)

## 2010-12-11 NOTE — H&P (Signed)
Hospital Admission Note Date: 12/11/2010  Patient name: Maria Nelson Medical record number: [id number] Date of birth: [birth year] Age: 55 y.o. Gender: female PCP: [name], MD, MD  Medical Service: Internal Medicine Teaching Service  Attending physician:  Dr. Aundria Rud     Resident (R2/R3): Dr. Gilford Rile   Pager: [phone] Resident (R1): Dr. Candy Sledge    Pager: [phone]  Chief Complaint: dyspnea  History of Present Illness: Patient is a 55 year old woman with a past history of COPD and severe obstructive sleep apnea who presents with 2-3 weeks of shortness of breath and persistent cough as well as new central chest pain that began a few days ago. She presented to urgent care on Tuesday and was prescribed tessalon perles as well as doxycycline. She was also prescribed combivent nebulizer which she did not yet fill. She uses he albuterol inhaler 4-5 times a day at home and used her sister's nebulizer on Saturday without much relief. She reports that hot temperature (whether shower/cooking/outside) cause her to feel SOB. She notes recent hot flashes that she feels have a similar effect. She notes a recent increase in her advair dosage from 250mg  to 500mg  and she describes a decrease in its effectiveness. She has not used home O2 in the past. Her dyspnea is made worse with exertion and with laying flat. She says she gets short of breath with walking a few feet across a hallway. She uses 6 pillows at night to elevate her head to about 45 degrees. She was diagnosed with sleep apnea in June but has not yet obtained a CPAP machine due to insurance coverage issues. She describes waking up after about 2 hours gasping for breath.  She denies fever and chills, and reports some yellow sputum production.   Meds: Current Outpatient Prescriptions  Medication Sig Dispense Refill  . albuterol (PROVENTIL,VENTOLIN) 90 MCG/ACT inhaler Inhale 2 puffs into the lungs every 4 (four) hours as needed.  17 g  3  .  desloratadine (CLARINEX) 5 MG tablet Take 1 tablet (5 mg total) by mouth daily.  30 tablet  3  . EPINEPHrine (EPIPEN) 0.3 mg/0.3 mL DEVI Inject 0.3 mg into the muscle once.        . Fluticasone-Salmeterol (ADVAIR DISKUS) 500-50 MCG/DOSE AEPB Inhale 1 puff into the lungs 2 (two) times daily.  1 each  6  . hydrocortisone 1 % cream Apply topically 3 (three) times daily. Apply to the affected area 3-4 times daily as needed.       . meloxicam (MOBIC) 7.5 MG tablet Take 1 tablet (7.5 mg total) by mouth daily.  30 tablet  2  . pantoprazole (PROTONIX) 40 MG tablet Take 1 tablet (40 mg total) by mouth daily.  90 tablet  2   Allergies: Aspirin Past Medical History  Diagnosis Date  . COPD (chronic obstructive pulmonary disease)     secondary to tobacco use  . Gastrointestinal hemorrhage     secondary to PUD on March 2008  . Peptic ulcer disease     +H. pylori (antigen)- Dr. Juanda Chance- treated  . ETOH abuse     Pt stopped in 3/08  . Tobacco abuse     Pt attempting to stop 8/12    Past Surgical History  Procedure Date  . Tubal ligation   . Mandible surgery     due to trauma   Family History  Problem Relation Age of Onset  . COPD Sister   . Breast cancer Sister   . Diabetes Mother   .  Coronary artery disease Mother    History   Social History  . Marital Status: Single    Spouse Name: N/A    Number of Children: 3  . Years of Education: N/A   Occupational History  . restaurant    Social History Main Topics  . Smoking status: Current Some Day Smoker -- 0.4 packs/day    Types: Cigarettes    Last Attempt to Quit: 06/18/2006  . Smokeless tobacco: Not on file  . Alcohol Use: No     Previous use prior to 3/08  . Drug Use: No  . Sexually Active: Not on file   Social History Narrative   Pt is not working now, was previously a Conservation officer, nature at [employer].Pt lives with her motherPt has received financial assistance approval for 100% discount at [facility] and has [facility] card.     Review of Systems: As per HPI.  Physical Exam: Vitals: T: 97.0  HR: 80   BP: 128/87  RR: 16  O2 saturation: 95% on 2L [place] General: sleepy appearing woman resting in bed with head of bed raised, in no acute distress HEENT: PERRL, EOMI, no scleral icterus Cardiac: RRR, no rubs, murmurs or gallops Pulm: inspiratory>expiratory wheeze in middle and lower lung fields, able to speak in full sentences Abd: soft, nontender, nondistended, BS present Ext: warm and well perfused, no pedal edema Neuro: alert and oriented X3, cranial nerves II-XII grossly intact  Lab results: Basic Metabolic Panel: Recent Labs  [facility] 12/10/10 1908   NA 141   K 3.9   CL 104   CO2 28   GLUCOSE 98   BUN 10   CREATININE 0.86   CALCIUM 10.0   MG --   PHOS --   Liver Function Tests: Recent Labs  [facility] 12/10/10 1908   AST 22   ALT 15   ALKPHOS 107   BILITOT 0.2*   PROT 7.9   ALBUMIN 3.9   CBC: Recent Labs  [facility] 12/10/10 1908   WBC 8.5   NEUTROABS 4.0   HGB 15.4*   HCT 45.1   MCV 80.2   PLT 220   Urinalysis:  Color, Urine                             YELLOW            YELLOW  Appearance                               CLEAR             CLEAR  Specific Gravity                         1.016             1.005-1.030  pH                                       5.5               5.0-8.0  Urine Glucose                            NEGATIVE          NEG  mg/dL  Bilirubin                                NEGATIVE          NEG  Ketones                                  NEGATIVE          NEG              mg/dL  Blood                                    NEGATIVE          NEG  Protein                                  NEGATIVE          NEG              mg/dL  Urobilinogen                             0.2               0.0-1.0          mg/dL  Nitrite                                  NEGATIVE          NEG  Leukocytes                               NEGATIVE          NEG  Misc. Labs:  Creatine  Kinase, Total                   240        h      7-177            U/L  CK, MB                                   5.7        h      0.3-4.0          ng/mL  Relative Index                           2.4               0.0-2.5 Troponin I                               <0.30             <0.30            ng/mL  Imaging results:   12/10/2010  CHEST - 2 VIEW Findings: Lungs are emphysematous but clear.  No pneumothorax or pleural effusion.  Heart size is normal.  IMPRESSION: Emphysema without acute disease.   Other results: EKG: sinus tachycardia, enlarged p-waves  Assessment & Plan by Problem:   - SOB due to *COPD exacerbation caused by URTI, allergen exposure, vs *Bronchitis vs *OSA-related pulmonary HTN/cor Pulmonale vs *ACS *Pneumonia - no infiltrate on CXR  *PE - Modified Geneva Score: 3 (low probability of 8%)  PLAN: - Duoneb Tx q 4 hr and PRN SOB - Solu-medrol 80 mg IV q 6 hrx 24 hrs, then 60 mg IV q 6 hrs - Regular insulin, SSI with CBG's per protocol - O2 Sats monitoring to keep SpO2 higher than 92%  - Advair 500/100 I puff bid - Singulair 10 mg PO daily - Avelox 400 mg PO daily - Consider Tamiflu - Pepcid 20 mg IV q 12 hrs - Cheratussin 10 cc PO q 4 hr PRN - Avoid caffeine and excessive sedation - Sputum Gram stain, C+S -CE x2 6 hours apart. - CBC, BMP, repeat EKG in AM. Consider repeat CXR if no improvement in patient's symptoms. -2-D ECHO to evaluate right heart strain    R2/3______________________________      R1________________________________  ATTENDING: I performed and/or observed a history and physical examination of the patient.  I discussed the case with the residents as noted and reviewed the residents' notes.  I agree with the findings and plan--please refer to the attending physician note for more details.  Signature________________________________  Printed Name_____________________________

## 2010-12-12 LAB — EXPECTORATED SPUTUM ASSESSMENT W GRAM STAIN, RFLX TO RESP C

## 2010-12-12 LAB — GLUCOSE, CAPILLARY: Glucose-Capillary: 122 mg/dL — ABNORMAL HIGH (ref 70–99)

## 2010-12-13 LAB — GLUCOSE, CAPILLARY: Glucose-Capillary: 116 mg/dL — ABNORMAL HIGH (ref 70–99)

## 2010-12-15 LAB — CULTURE, RESPIRATORY W GRAM STAIN: Culture: NORMAL

## 2010-12-18 NOTE — Discharge Summary (Signed)
Maria Nelson, Maria Nelson NO.:  [id number]  MEDICAL RECORD NO.:  [id number]  LOCATION:  4706                         FACILITY:  [facility]  PHYSICIAN:  C. Ulyess Mort, M.D.DATE OF BIRTH:  1956/01/02  DATE OF ADMISSION:  12/11/2010 DATE OF DISCHARGE:  12/13/2010                              DISCHARGE SUMMARY   ATTENDING PHYSICIAN:  C. Ulyess Mort, MD  DISCHARGE DIAGNOSES: 1. Chronic obstructive pulmonary disease exacerbation. 2. Obstructive sleep apnea. 3. Tobacco abuse. 4. Peptic ulcer disease.  DISCHARGE MEDICATIONS: 1. Albuterol 2.5 mg inhaled every 4 hours as needed. 2. Guaifenesin 10 mL by mouth every 6 hours as needed. 3. Atrovent 0.5 mg inhaled every 6 hours as needed. 4. Avelox 400 mg by mouth for 3 days, the patient was treated for 2     days during hospitalization for a total of 5-day treatment. 5. Prednisone 40 mg by mouth daily with a meal for 10 days. 6. Advair Diskus 500/50 two puffs inhaled twice daily. 7. Albuterol inhaler 2 puffs inhaled every 4 hours as needed for     shortness of breath. 8. Diphenhydramine 1 tablet by mouth every 4 hours as needed for face     swelling. 9. Epinephrine Pen 1 injection intramuscularly daily as needed for     tongue/mouth swelling. 10.Hydrocortisone cream 1% one application topically three times a day     as needed to affected areas of arm and wrist. 11.Meloxicam 7.5 mg 1 tablet by mouth daily as needed for pain. 12.Pantoprazole 40 mg 1 tablet by mouth daily as needed for acid     reflux. 13.Singulair 10 mg 1 tablet by mouth daily. 14.Zantac 1 tablet by mouth daily. 15.Zyrtec 10 mg 1 tablet by mouth every other day.  DISPOSITION AND FOLLOWUP:  Ms. Paszkiewicz is medically stable for safe discharge to home.  She is scheduled to follow up with her PCP, Dr. [name] [facility] on December 25, 2010, at 3:30 p.m.  At this appointment, she should also be followed up regarding  titration of her CPAP.  She was discharged with a nebulizer, but CPAP was held until she follows up in clinic, but the patient reports that she has completed some paperwork with [facility].  PROCEDURE:  Chest x-ray on December 10, 2010, lungs are emphysematous, but clear.  No pneumothorax or pleural effusion.  Heart size is normal.  CHIEF COMPLAINT:  Dyspnea.  HISTORY OF PRESENT ILLNESS:  The patient is a 55 year old woman with past medical history of COPD and severe obstructive sleep apnea, who presents with 2-3 weeks of shortness of breath and persistent cough as well as central chest pain that began a few days ago.  She also presented to Urgent Care on Tuesday and was prescribed Tessalon Perles as well as doxycycline.  She was also prescribed a Combivent nebulizer which she did not fill yet.  She uses albuterol inhaler 4-5 times a day at home and she used her sister's nebulizer on Saturday without much relief.  She reports hot temperature causes her to feel short of breath, for example hot shower during cooking or when outside.  She notes recent hot flashes that she feels like have a similar  effect.  She notes a recent increase in Advair dosage from 250 mg to 500 mg and describes a decrease in effectiveness.  She is not used home O2 in the past.  Her dyspnea is made worse with exertion and when lying flat.  She says she gets short of breath with walking a few feet across the hallway.  She uses 6 pillows at night to elevate her head about 45 degrees.  She was diagnosed with sleep apnea in June, but has not yet obtained a CPAP machine due to insurance coverage issues.  She describes waking up after about 2 hours gasping for breath.  She denies fever and chills, and recently reports some yellow sputum production.  VITAL SIGNS:  At admission, temperature 97, heart rate 80, blood pressure 128/87, respiratory rate 16, O2 saturation 95% on 2 liters nasal cannula. GENERAL:   Sleepy-appearing woman, resting in bed with head of bed raised, no acute distress. HEENT: Pupils equal, round, reactive to light.  Extraocular muscles intact.  No scleral icterus. CARDIAC:  Regular rate and rhythm.  No rubs, murmurs or gallops. PULMONARY:  Inspiratory greater than expiratory wheezing in the middle and lower lung fields.  Able to speak in full sentences. ABDOMEN:  Soft, nontender, nondistended.  Bowel sounds present. EXTREMITIES:  Warm and well perfused.  No pedal edema. NEURO:  Alert and oriented x3.  Cranial nerves II through XII grossly intact.  LABS ON ADMISSION:  Sodium 141, potassium 3.9, chloride 104, CO2 of 28, glucose 98, BUN 10, creatinine 0.86, calcium 10.  AST 22, ALT 15, alkaline phosphatase 107, total bilirubin 0.2, protein 7.9, albumin 3.9. WBC 8.5, hemoglobin 15.4, hematocrit 45.1, MCV 80.2, platelets 220. Urinalysis was normal.  Creatine kinase 240, CK-MB 5.7, relative index 2.4.  Troponin less than 0.3.  EKG, sinus tachycardia, enlarged P-waves.  HOSPITAL COURSE: 1. The patient presented with shortness of breath secondary to COPD     exacerbation, likely secondary to upper respiratory tract infection     along with severe obstructive sleep apnea contributing at night.     During hospitalization, she was started on a 5-day course of Avelox     IV and then oral steroids as well as DuoNeb.  She was also     continued on her home dose of Singulair and Zyrtec in case allergen     exposure was contributing.  Sputum was collected for Gram stain and     cultures.  She was ruled out for MI by negative EKG and cardiac     enzymes.  Culture revealed no growth.  Sputum stain did reveal     moderate white blood cells present, both neutrophils and     mononuclear, few squamous epithelial cells present and rare yeast.     Given rare yeast without complaints of dysphagia, I did not start     antifungal treatment given her steroid use.  She will need to have     this  reevaluated at her followup appointment.  During     hospitalization, she was also treated with CPAP on auto-bilevel     mode with 2 liters O2 at night which she tolerated well.  Given her     recent sleep study indicated a need to return for titration, I did     not discharge her with CPAP.  She was discharged with nebulizer.     PFTs were performed during hospitalization with revealed an FEV-1     to FVC ratio of 54%.  At discharge, the patient no longer     experienced shortness of breath and reported feeling much better     overall. 2. Tobacco abuse.  The patient reported that her last cigarette was     smoked a few days ago and she has now quit.  She reports that she     has no longer a desire to smoke given symptoms that she has     experienced.  She denied needing a nicotine patch to aid with     cessation as well as denied wanting counseling from a smoking     cessation nurse. 3. Peptic ulcer disease.  The patient was continued on her home dose     of Protonix during hospitalization.  There were no acute events     during hospitalization. 4. Hyperglycemia.  The patient's sugar was elevated during     hospitalization which was likely secondary to steroid.  She does     not have a history of diabetes mellitus.  Her A1c was 5.6.  VITALS AT DISCHARGE:  Temperature 97.4, heart rate 84, blood pressure 156/95, respiratory rate 18, O2 saturation 90% on room air.  DISCHARGE LABS:  TSH 0.629.  Pro-BNP 91.7.  Sodium 139, potassium 4.1, chloride 104, CO2 of 22, glucose 148, BUN 10, creatinine 0.79, calcium 10.5.  WBC 7.4, hemoglobin 15.6, hematocrit 46.3, MCV 79.8, platelet count 227.  Hemoglobin A1c 5.6.    ______________________________ Vernice Jefferson, MD   ______________________________ C. Ulyess Mort, M.D.    NK/MEDQ  D:  12/13/2010  T:  12/14/2010  Job:  [id number]  cc:   [name], MD  Electronically Signed by Vernice Jefferson MD on 12/16/2010 12:23:58 PM Electronically  Signed by Eliezer Lofts M.D. on 12/18/2010 05:11:20 PM

## 2010-12-25 ENCOUNTER — Ambulatory Visit (INDEPENDENT_AMBULATORY_CARE_PROVIDER_SITE_OTHER): Payer: Self-pay | Admitting: Internal Medicine

## 2010-12-25 ENCOUNTER — Encounter: Payer: Self-pay | Admitting: Internal Medicine

## 2010-12-25 VITALS — BP 99/73 | HR 76 | Temp 98.4°F | Ht 60.0 in | Wt 199.3 lb

## 2010-12-25 DIAGNOSIS — F17200 Nicotine dependence, unspecified, uncomplicated: Secondary | ICD-10-CM

## 2010-12-25 DIAGNOSIS — G4730 Sleep apnea, unspecified: Secondary | ICD-10-CM

## 2010-12-25 DIAGNOSIS — Z23 Encounter for immunization: Secondary | ICD-10-CM

## 2010-12-25 DIAGNOSIS — J441 Chronic obstructive pulmonary disease with (acute) exacerbation: Secondary | ICD-10-CM

## 2010-12-25 DIAGNOSIS — Z0000 Encounter for general adult medical examination without abnormal findings: Secondary | ICD-10-CM

## 2010-12-25 MED ORDER — ALBUTEROL SULFATE (2.5 MG/3ML) 0.083% IN NEBU: 2.5000 mg | INHALATION_SOLUTION | Freq: Four times a day (QID) | RESPIRATORY_TRACT | Status: DC | PRN

## 2010-12-25 MED ORDER — IPRATROPIUM BROMIDE 0.02 % IN SOLN: 500.0000 ug | Freq: Four times a day (QID) | RESPIRATORY_TRACT | Status: DC | PRN

## 2010-12-25 NOTE — Assessment & Plan Note (Signed)
The patient's recent COPD exacerbation appears to have resolved, and the patient's breathing is now better than her baseline, with no albuterol inhaler use since discharge. -the patient's duoneb prescription was replaced with individual ipratropium and albuterol neb prescriptions (which are $4 each). -the patient was counseled on not using both albuterol inhaler and nebulizer at the same time -the patient will finish her steroid taper in 2 days

## 2010-12-25 NOTE — Assessment & Plan Note (Addendum)
The patient has a diagnosis of sleep apnea, and has a CPAP mask, but has not received her CPAP machine -I called [facility] this evening.  Apparently there were a few paperwork hold-ups in the beginning, but the last required form was filed 2 days ago, so the patient will likely receive her machine sometime next week.

## 2010-12-25 NOTE — Patient Instructions (Signed)
You have a diagnosis of sleep apnea, and should have received a CPAP machine.  I will call [facility] to see if we can speed up the process of getting you your machine -in the meantime, you may use the ipatropium and albuterol nebulizer treatments as needed -DO NOT use both albuterol inhaler and nebulizer treatments at the same time; try the albuterol inhaler first, and if that does not work you may later try the albuterol nebulizer  Please return for a follow-up visit in 3 months

## 2010-12-25 NOTE — Progress Notes (Signed)
HPI The patient is a 55 yo woman with a history of COPD, asthma, OSA, and GERD, presenting for a hospital follow-up for a COPD exacerbation.  The patient was recently discharged 12/13/10 following a COPD exacerbation, likely triggered by URI. Since discharge the patient has completed 3 additional days of Avelox for a total of 5 days of antibiotic treatment.  She has also been completing its an oral steroid taper and will finish this course of treatment in 2 days.  The patient notes a great improvement in her symptoms, saying that she is currently breathing better than she has in a long time.  She notes no cough, congestion, rhinorrhea, or fevers.  The patient has continued to use her daily advair and spiriva inhalers, but has not used her albuterol inhaler since discharge. The patient was given a prescription for home duo nebs and a home nebulizer machine but she has been unable to fill this prescription due to the cost of the prescription.  The patient also notes that she quit smoking tobacco 3 weeks ago and has not had a cigarette since discharge. The patient notes that the cravings are there but that she is able to overcome the cravings.  The patient has a history of obstructive sleep apnea diagnosed by polysomnogram in June 2012.  The patient has been fitted for a CPAP mask but has not yet [facility]  ROS: General: no fevers, chills, changes in weight, changes in appetite Skin: no rash HEENT: no blurry vision, hearing changes, sore throat Pulm: no dyspnea, coughing, wheezing CV: no chest pain, palpitations, shortness of breath Abd: no abdominal pain, nausea/vomiting, diarrhea/constipation GU: no dysuria, hematuria, polyuria Ext: no arthralgias, myalgias Neuro: no weakness, numbness, or tingling  Filed Vitals:   12/25/10 1530  BP: 99/73  Pulse: 76  Temp: 98.4 F (36.9 C)    PEX General: alert, cooperative, and in no apparent distress HEENT: pupils  equal round and reactive to light, vision grossly intact, oropharynx clear and non-erythematous  Neck: supple, no lymphadenopathy, JVD, or carotid bruits Lungs: clear to ascultation bilaterally, normal work of respiration, no wheezes, rales, ronchi Heart: regular rate and rhythm, no murmurs, gallops, or rubs Abdomen: soft, non-tender, non-distended, normal bowel sounds Msk: no joint edema, warmth, or erythema Extremities: 2+ DP/PT pulses bilaterally, no cyanosis, clubbing, or edema Neurologic: alert & oriented X3, cranial nerves II-XII intact, strength grossly intact, sensation intact to light touch  Assessment/Plan

## 2010-12-25 NOTE — Assessment & Plan Note (Signed)
The patient continues to abstain from smoking, and has not had a cigarette in 3 weeks! -the patient was congratulated on her success, and counseled on ways to avoid triggering situations and ways to circumvent cravings

## 2010-12-25 NOTE — Assessment & Plan Note (Signed)
Patient given a flu shot today

## 2011-01-18 ENCOUNTER — Other Ambulatory Visit: Payer: Self-pay | Admitting: *Deleted

## 2011-01-18 LAB — POCT I-STAT, CHEM 8
Calcium, Ion: 1.2
HCT: 48 — ABNORMAL HIGH
TCO2: 24

## 2011-01-18 LAB — URINALYSIS, ROUTINE W REFLEX MICROSCOPIC
Bilirubin Urine: NEGATIVE
Glucose, UA: NEGATIVE
Ketones, ur: NEGATIVE
Protein, ur: NEGATIVE

## 2011-01-18 LAB — GLUCOSE, CAPILLARY: Glucose-Capillary: 128 — ABNORMAL HIGH

## 2011-01-18 LAB — COMPREHENSIVE METABOLIC PANEL
AST: 27
CO2: 24
Calcium: 9
Creatinine, Ser: 0.95
GFR calc Af Amer: >60
GFR calc non Af Amer: >60
Glucose, Bld: 108 — ABNORMAL HIGH
Sodium: 137
Total Protein: 6.9

## 2011-01-18 LAB — CBC
MCHC: 32.9
MCV: 82.9
Platelets: 194
RBC: 4.84
RDW: 15.3
RDW: 15.5

## 2011-01-18 LAB — POCT I-STAT 3, ART BLOOD GAS (G3+)
Acid-base deficit: 2
Bicarbonate: 23
O2 Saturation: 90
Patient temperature: 100.5
TCO2: 24

## 2011-01-18 LAB — HEMOGLOBIN A1C
Hgb A1c MFr Bld: 5.5
Mean Plasma Glucose: 111

## 2011-01-18 LAB — LIPID PANEL
Cholesterol: 185
HDL: 61
LDL Cholesterol: 116 — ABNORMAL HIGH
Total CHOL/HDL Ratio: 3
Triglycerides: 38
VLDL: 8

## 2011-01-18 LAB — CULTURE, RESPIRATORY W GRAM STAIN: Culture: NORMAL

## 2011-01-18 LAB — CULTURE, BLOOD (ROUTINE X 2): Culture: NO GROWTH

## 2011-01-18 LAB — DIFFERENTIAL
Basophils Absolute: 0
Eosinophils Relative: 1
Lymphocytes Relative: 10 — ABNORMAL LOW
Lymphocytes Relative: 4 — ABNORMAL LOW
Lymphs Abs: 0.3 — ABNORMAL LOW
Monocytes Relative: 1 — ABNORMAL LOW
Neutro Abs: 5.1
Neutrophils Relative %: 96 — ABNORMAL HIGH

## 2011-01-18 LAB — CARDIAC PANEL(CRET KIN+CKTOT+MB+TROPI)
CK, MB: 2
Total CK: 406 — ABNORMAL HIGH
Troponin I: 0.01

## 2011-01-18 LAB — POCT CARDIAC MARKERS
Myoglobin, poc: 243
Troponin i, poc: <0.05

## 2011-01-18 LAB — CK TOTAL AND CKMB (NOT AT ARMC): Relative Index: 0.6

## 2011-01-18 LAB — D-DIMER, QUANTITATIVE: D-Dimer, Quant: 0.29

## 2011-01-18 LAB — TROPONIN I: Troponin I: 0.03

## 2011-01-18 LAB — TSH: TSH: 0.621

## 2011-01-19 MED ORDER — ALBUTEROL 90 MCG/ACT IN AERS: 2.0000 | INHALATION_SPRAY | RESPIRATORY_TRACT | Status: DC | PRN

## 2011-01-19 NOTE — Telephone Encounter (Signed)
REfill faxed to the [facility].

## 2011-01-26 ENCOUNTER — Telehealth: Payer: Self-pay | Admitting: Licensed Clinical Social Worker

## 2011-01-26 NOTE — Telephone Encounter (Signed)
Call to patient to assist her with Disability process.   Ms. Burdine had hospitalization back in August 2012 due to COPD exacerbation. 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 Patient lives w/ Mom and they live on her fixed income.  Gets foodstamps of around $200 per month.  Patient has orange card. Applied for Medicaid earlier in the year and was denied.   Supports:  Reports no family or church supports at this time.   Plan is to refer to [facility] for help regarding her disability.  Patient has multiple diagnoses w/ COPD as primary diagnosis.  Patient receptive to our help and knows to expect call from [facility].

## 2011-01-28 LAB — CBC
HCT: 39.8
Hemoglobin: 13
Hemoglobin: 13.2
Hemoglobin: 13.3
MCV: 81.1
Platelets: 243
RBC: 4.8
RDW: 15.4 — ABNORMAL HIGH
RDW: 15.7 — ABNORMAL HIGH
WBC: 4.3
WBC: 6.2

## 2011-01-28 LAB — IRON AND TIBC: Iron: 13 — ABNORMAL LOW

## 2011-01-28 LAB — RETICULOCYTES
RBC.: 5.14 — ABNORMAL HIGH
Retic Count, Absolute: 41.1

## 2011-01-28 LAB — DIFFERENTIAL
Basophils Absolute: 0
Basophils Relative: 0
Eosinophils Absolute: 0.1
Lymphs Abs: 0.8
Monocytes Relative: 4
Neutro Abs: 4.1
Neutrophils Relative %: 81 — ABNORMAL HIGH
Neutrophils Relative %: 94 — ABNORMAL HIGH

## 2011-01-28 LAB — BASIC METABOLIC PANEL
BUN: 13
Calcium: 9.3
Chloride: 103
Creatinine, Ser: 0.9
Creatinine, Ser: 0.93
GFR calc Af Amer: >60
GFR calc non Af Amer: >60
Glucose, Bld: 96
Potassium: 3.2 — ABNORMAL LOW
Sodium: 135
Sodium: 139

## 2011-01-28 LAB — COMPREHENSIVE METABOLIC PANEL
Alkaline Phosphatase: 95
BUN: 8
Chloride: 104
Glucose, Bld: 206 — ABNORMAL HIGH
Potassium: 3.3 — ABNORMAL LOW
Total Bilirubin: 0.4

## 2011-01-28 LAB — FERRITIN: Ferritin: 76 (ref 10–291)

## 2011-01-28 LAB — HEMOGLOBIN A1C
Hgb A1c MFr Bld: 5.6
Mean Plasma Glucose: 122

## 2011-01-28 LAB — VITAMIN B12: Vitamin B-12: 411 (ref 211–911)

## 2011-02-08 NOTE — Telephone Encounter (Signed)
Faxed referral to [facility] this AM.

## 2011-02-12 ENCOUNTER — Encounter: Payer: Self-pay | Admitting: Internal Medicine

## 2011-02-23 ENCOUNTER — Other Ambulatory Visit: Payer: Self-pay | Admitting: *Deleted

## 2011-02-23 MED ORDER — DESLORATADINE 5 MG PO TABS: 5.0000 mg | ORAL_TABLET | Freq: Every day | ORAL | Status: DC

## 2011-02-24 NOTE — Telephone Encounter (Signed)
Rx faxed in.

## 2011-05-04 ENCOUNTER — Other Ambulatory Visit: Payer: Self-pay | Admitting: *Deleted

## 2011-05-05 MED ORDER — FLUTICASONE-SALMETEROL 500-50 MCG/DOSE IN AEPB: 1.0000 | INHALATION_SPRAY | Freq: Two times a day (BID) | RESPIRATORY_TRACT | Status: DC

## 2011-05-06 NOTE — Telephone Encounter (Signed)
Advair diskus refilled - rx request faxed to [facility].

## 2011-05-11 ENCOUNTER — Other Ambulatory Visit: Payer: Self-pay | Admitting: Internal Medicine

## 2011-05-11 DIAGNOSIS — Z1231 Encounter for screening mammogram for malignant neoplasm of breast: Secondary | ICD-10-CM

## 2011-05-18 ENCOUNTER — Other Ambulatory Visit: Payer: Self-pay | Admitting: *Deleted

## 2011-05-18 MED ORDER — DESLORATADINE 5 MG PO TABS: 5.0000 mg | ORAL_TABLET | Freq: Every day | ORAL | Status: DC

## 2011-05-18 NOTE — Telephone Encounter (Signed)
Pharmacy is requesting 90 tablets.  Angelina Ok, RN 05/18/2011 10:53 PM

## 2011-05-20 NOTE — Telephone Encounter (Signed)
Refill was called to the [facility].Angelina Ok, RN 05/20/2011 3:25 PM.

## 2011-06-15 ENCOUNTER — Other Ambulatory Visit: Payer: Self-pay | Admitting: *Deleted

## 2011-06-15 MED ORDER — PANTOPRAZOLE SODIUM 40 MG PO TBEC: 40.0000 mg | DELAYED_RELEASE_TABLET | Freq: Every day | ORAL | Status: DC

## 2011-06-15 NOTE — Telephone Encounter (Signed)
Protonix refilled- rx request form faxed to [facility].

## 2011-06-18 ENCOUNTER — Ambulatory Visit (HOSPITAL_COMMUNITY)
Admission: RE | Admit: 2011-06-18 | Discharge: 2011-06-18 | Disposition: A | Discharge status: Home / Self Care | Payer: Self-pay | Source: Ambulatory Visit | Attending: Internal Medicine | Admitting: Internal Medicine

## 2011-06-18 DIAGNOSIS — Z1231 Encounter for screening mammogram for malignant neoplasm of breast: Secondary | ICD-10-CM | POA: Insufficient documentation

## 2011-06-23 ENCOUNTER — Encounter: Payer: Self-pay | Admitting: Internal Medicine

## 2011-06-23 ENCOUNTER — Ambulatory Visit (INDEPENDENT_AMBULATORY_CARE_PROVIDER_SITE_OTHER): Payer: Self-pay | Admitting: Internal Medicine

## 2011-06-23 VITALS — BP 110/70 | HR 76 | Temp 98.6°F | Ht 60.0 in | Wt 221.5 lb

## 2011-06-23 DIAGNOSIS — Z0000 Encounter for general adult medical examination without abnormal findings: Secondary | ICD-10-CM

## 2011-06-23 DIAGNOSIS — G4700 Insomnia, unspecified: Secondary | ICD-10-CM

## 2011-06-23 DIAGNOSIS — G4730 Sleep apnea, unspecified: Secondary | ICD-10-CM

## 2011-06-23 DIAGNOSIS — G5600 Carpal tunnel syndrome, unspecified upper limb: Secondary | ICD-10-CM

## 2011-06-23 DIAGNOSIS — R109 Unspecified abdominal pain: Secondary | ICD-10-CM | POA: Insufficient documentation

## 2011-06-23 DIAGNOSIS — Z23 Encounter for immunization: Secondary | ICD-10-CM

## 2011-06-23 DIAGNOSIS — R252 Cramp and spasm: Secondary | ICD-10-CM

## 2011-06-23 HISTORY — DX: Carpal tunnel syndrome, unspecified upper limb: G56.00

## 2011-06-23 MED ORDER — TRAZODONE 25 MG HALF TABLET: 25.0000 mg | ORAL_TABLET | Freq: Every day | ORAL | Status: DC

## 2011-06-23 NOTE — Assessment & Plan Note (Signed)
The patient notes a 40-month history of bilateral side "crampy" pain around the T8 level, present for about the last 3 months, with tenderness to palpation of spine around T8 level, with no report of trauma.  Symptoms may represent herniated disk (though symptoms are bilateral, which would be unusual) vs muscle cramps (possibly due to electrolyte abnormalities, such as low Mg).  Patient notes no symptoms of loss of bowel/bladder control, or LE numbness. -check BMET, Mg today -encourage back strengthening exercises, such as sit-ups -follow-up at next visit

## 2011-06-23 NOTE — Patient Instructions (Signed)
For your sleep apnea, we are trying 2 things to help improve your usage: 1. Call [facility], to try different CPAP masks to improve comfort 2. Take Trazodone, 1/2 to 1 tablet before bedtime to decrease anxiety  For your muscle cramps, we're checking some electrolyte levels today, and we'll call you with any abnormal results  For your carpal tunnel syndrome, pick up a RIGID wrist splint (with some type of metal or plastic bar in the split), 24 hours a day if possible, or just at night if it interferes with your daily activity.  Please return for a follow-up visit in 6 months

## 2011-06-23 NOTE — Progress Notes (Signed)
HPI The patient is a 56 yo woman, history of asthma, COPD, PUD, and OSA, presenting for a routine follow-up.  The patient received a CPAP machine since our last visit, but has had trouble tolerating it, saying that it makes her feel "uncomfortable" and "anxious" when she wears it.  She is currently using a nasal pillow, based on her description.  The patient also notes a 40-month history of bilateral T8-level side "cramping" sensation, worse when twisting her torso or bending down.  The patient notes no recent trauma.  She notes no loss of bowel/bladder control, and no LE numbness/weakness.  The patient has a history of prior carpal tunnel syndrome.  She now presents with numbness/tingling in her right hand, particularly her palm and 1st-3rd fingers, after manipulating small objects such as silverware.  ROS: General: no fevers, chills, changes in weight, changes in appetite Skin: no rash HEENT: no blurry vision, hearing changes, sore throat Pulm: no dyspnea, coughing, wheezing CV: no chest pain, palpitations, shortness of breath Abd: no abdominal pain, nausea/vomiting, diarrhea/constipation GU: no dysuria, hematuria, polyuria Neuro: see HPI  Filed Vitals:   06/23/11 1334  BP: 110/70  Pulse: 76  Temp: 98.6 F (37 C)    PEX General: alert, cooperative, and in no apparent distress HEENT: pupils equal round and reactive to light, vision grossly intact, oropharynx clear and non-erythematous  Neck: supple, no lymphadenopathy Lungs: clear to ascultation bilaterally, normal work of respiration, no wheezes, rales, ronchi Heart: regular rate and rhythm, no murmurs, gallops, or rubs Abdomen: soft, non-tender, non-distended, normal bowel sounds Msk: Right wrist non-tender to palpation, with negative tinel's test.  Patient's spine is tender to palpation around the T8 level, with no pain on palpation of patient's ribs or flanks.  Bilateral side pain elicited with torso rotation and  flexion. Extremities: no cyanosis, clubbing, or edema Neurologic: alert & oriented X3, cranial nerves II-XII intact, strength grossly intact, sensation intact to light touch  Assessment/Plan

## 2011-06-23 NOTE — Assessment & Plan Note (Signed)
Patient notes symptoms of right hand numbness/pain, particularly on palmar surface of fingers 1-3, consistent with carpal tunnel syndrome, though tinel's test was negative.  Patient notes that she previously treated her symptoms with long-term high-dose ibuprofen. -patient instructed to wear a rigid wrist splint for at least 4 weeks, and assess response -patient offered option of going to OT to have a custom wrist splint made, but preferred to try OTC splint first -patient may take ibuprofen as needed to decrease pain and inflammation, but was cautioned about it's GI side effects, and told to take this medication with food, and not every day.

## 2011-06-23 NOTE — Assessment & Plan Note (Signed)
tdap given today

## 2011-06-23 NOTE — Assessment & Plan Note (Signed)
Patient has started using CPAP machine, but notes difficulty with compliance, noting anxiety and discomfort with CPAP mask.  She has only tried a nasal pillow, no other CPAP masks.  Her story is corroborated by a compliance report received from 01/03/11-02/02/11, showing that the patient only used her CPAP for 4 out of 30 nights at that time. -The patient was encouraged to call [facility], and to try different varieties of CPAP masks.  We discussed that patients have different responses to different masks, and that she might find other masks easier to tolerate.  The patient agreed to try this. -The patient was encouraged to try trazodone, 25 mg qhs before sleep, to reduce anxiety to help her sleep.  If 25 mg is ineffective, she may increase dose to 50 mg. -follow-up at next visit

## 2011-06-24 LAB — BASIC METABOLIC PANEL
CO2: 28 mEq/L (ref 19–32)
Calcium: 9.7 mg/dL (ref 8.4–10.5)
Potassium: 4.4 mEq/L (ref 3.5–5.3)
Sodium: 141 mEq/L (ref 135–145)

## 2011-06-24 LAB — MAGNESIUM: Magnesium: 2.2 mg/dL (ref 1.5–2.5)

## 2011-07-12 ENCOUNTER — Telehealth: Payer: Self-pay | Admitting: *Deleted

## 2011-07-12 NOTE — Telephone Encounter (Signed)
Request from [facility] pharmacy: refill requestProventil HFA  - Inhale 2 puffs by mouth every 4 hrs as needed Qty 3 inhalers  Last filled - 04/21/11.

## 2011-07-15 ENCOUNTER — Other Ambulatory Visit: Payer: Self-pay | Admitting: Internal Medicine

## 2011-07-15 MED ORDER — ALBUTEROL SULFATE HFA 108 (90 BASE) MCG/ACT IN AERS: 2.0000 | INHALATION_SPRAY | RESPIRATORY_TRACT | Status: DC | PRN

## 2011-07-15 NOTE — Telephone Encounter (Signed)
Refill called in to pharmacy.  Thank you.

## 2011-07-28 ENCOUNTER — Other Ambulatory Visit: Payer: Self-pay | Admitting: *Deleted

## 2011-07-29 MED ORDER — DESLORATADINE 5 MG PO TABS: 5.0000 mg | ORAL_TABLET | Freq: Every day | ORAL | Status: DC

## 2011-07-29 MED ORDER — ALBUTEROL SULFATE HFA 108 (90 BASE) MCG/ACT IN AERS: 2.0000 | INHALATION_SPRAY | RESPIRATORY_TRACT | Status: DC | PRN

## 2011-07-29 NOTE — Telephone Encounter (Signed)
Clarinex and Proventil refilled - rx request forms faxed to [facility].

## 2011-08-04 ENCOUNTER — Telehealth: Payer: Self-pay | Admitting: *Deleted

## 2011-08-04 NOTE — Telephone Encounter (Signed)
Pt came to clinic asking for a letter written, she wants to have it ready to give to a lawyer when she gets one. She is looking for a letter of disability stating she is unable to work. She was seen at social security, per Dr Theora Gianotti request, and was turned down for disability.  I told her I would have our Social Worker call her to see exactly what is needed. Will also send to Dr Manson Passey as he is aware of what pt wants.  Pt # [id number]

## 2011-08-05 NOTE — Telephone Encounter (Signed)
I'm sorry to hear she was turned down for disability.  I'd be happy to write a note detailing the patient's medical comorbidities if this will be helpful.  I'm unfortunately very unfamiliar with this process, but will be happy to assist Shana in any way possible.

## 2011-08-05 NOTE — Telephone Encounter (Signed)
I talked with [place], CSW and she will f/u with patient and guide her in this process. Pt informed

## 2011-08-10 ENCOUNTER — Telehealth: Payer: Self-pay | Admitting: Licensed Clinical Social Worker

## 2011-08-10 NOTE — Telephone Encounter (Signed)
CSW returned call to pt to discuss Disability Application process.  CSW informed pt, PCP is able to write letter, but would be better off at this time to provide attorney with ROI to obtain medical records from [facility].  Pt has yet to obtain attorney, states she is planning to do this this week.  Pt states she has already worked with [employer] during the initial claim that was denied.  CSW encouraged pt to call [facility] to obtain referral for attorneys that can assist; as well as, CSW provided pt with information on [employer] [phone].  Pt aware that once she obtains an attorney and completes a ROI, if she still is in need of a letter from her PCP outlining her disabilities it can be provided at that time.  Pt aware CSW is available to assist as needed.  Pt to call [facility] and obtain attorney to assist her with appealing her disability denial.

## 2011-09-03 ENCOUNTER — Encounter (HOSPITAL_COMMUNITY): Payer: Self-pay | Admitting: Emergency Medicine

## 2011-09-03 ENCOUNTER — Emergency Department (HOSPITAL_COMMUNITY): Payer: Self-pay

## 2011-09-03 ENCOUNTER — Emergency Department (HOSPITAL_COMMUNITY)
Admission: EM | Admit: 2011-09-03 | Discharge: 2011-09-04 | Disposition: A | Discharge status: Home / Self Care | Payer: Self-pay | Attending: Emergency Medicine | Admitting: Emergency Medicine

## 2011-09-03 DIAGNOSIS — R0789 Other chest pain: Secondary | ICD-10-CM | POA: Insufficient documentation

## 2011-09-03 DIAGNOSIS — Z79899 Other long term (current) drug therapy: Secondary | ICD-10-CM | POA: Insufficient documentation

## 2011-09-03 DIAGNOSIS — R05 Cough: Secondary | ICD-10-CM | POA: Insufficient documentation

## 2011-09-03 DIAGNOSIS — R0602 Shortness of breath: Secondary | ICD-10-CM | POA: Insufficient documentation

## 2011-09-03 DIAGNOSIS — J441 Chronic obstructive pulmonary disease with (acute) exacerbation: Secondary | ICD-10-CM | POA: Insufficient documentation

## 2011-09-03 DIAGNOSIS — R059 Cough, unspecified: Secondary | ICD-10-CM | POA: Insufficient documentation

## 2011-09-03 LAB — CBC
Hemoglobin: 14.3 g/dL (ref 12.0–15.0)
Platelets: 253 10*3/uL (ref 150–400)
RBC: 5.33 MIL/uL — ABNORMAL HIGH (ref 3.87–5.11)
WBC: 7.4 10*3/uL (ref 4.0–10.5)

## 2011-09-03 LAB — TROPONIN I: Troponin I: <0.3 ng/mL (ref <0.30)

## 2011-09-03 LAB — BASIC METABOLIC PANEL
CO2: 23 mEq/L (ref 19–32)
Glucose, Bld: 80 mg/dL (ref 70–99)
Potassium: 4.3 mEq/L (ref 3.5–5.1)
Sodium: 138 mEq/L (ref 135–145)

## 2011-09-03 MED ORDER — ALBUTEROL (5 MG/ML) CONTINUOUS INHALATION SOLN
10.0000 mg/h | INHALATION_SOLUTION | Freq: Once | RESPIRATORY_TRACT | Status: AC
  Administered 2011-09-03: 10 mg/h via RESPIRATORY_TRACT
  Filled 2011-09-03: qty 20

## 2011-09-03 MED ORDER — ALBUTEROL SULFATE (5 MG/ML) 0.5% IN NEBU
5.0000 mg | INHALATION_SOLUTION | Freq: Once | RESPIRATORY_TRACT | Status: AC
  Administered 2011-09-03: 5 mg via RESPIRATORY_TRACT
  Filled 2011-09-03 (×2): qty 1

## 2011-09-03 MED ORDER — IBUPROFEN 200 MG PO TABS
400.0000 mg | ORAL_TABLET | Freq: Once | ORAL | Status: AC
  Administered 2011-09-03: 400 mg via ORAL
  Filled 2011-09-03: qty 2

## 2011-09-03 MED ORDER — ALBUTEROL SULFATE (5 MG/ML) 0.5% IN NEBU
5.0000 mg | INHALATION_SOLUTION | Freq: Once | RESPIRATORY_TRACT | Status: AC
  Administered 2011-09-03: 5 mg via RESPIRATORY_TRACT
  Filled 2011-09-03: qty 1

## 2011-09-03 MED ORDER — IPRATROPIUM BROMIDE 0.02 % IN SOLN
0.5000 mg | Freq: Once | RESPIRATORY_TRACT | Status: AC
  Administered 2011-09-03: 0.5 mg via RESPIRATORY_TRACT
  Filled 2011-09-03: qty 2.5

## 2011-09-03 MED ORDER — PREDNISONE 20 MG PO TABS
60.0000 mg | ORAL_TABLET | Freq: Once | ORAL | Status: AC
  Administered 2011-09-03: 60 mg via ORAL
  Filled 2011-09-03: qty 3

## 2011-09-03 NOTE — ED Notes (Signed)
Pt reports SOB, chest tightness starting yesterday- tried taking 2 breathing txs with no relief; no n/v

## 2011-09-03 NOTE — ED Notes (Signed)
Respiratory notified for hour long neb treatment

## 2011-09-03 NOTE — ED Notes (Signed)
PA at bedside.

## 2011-09-03 NOTE — ED Provider Notes (Signed)
History     CSN: [id number]  Arrival date & time 09/03/11  1746   First MD Initiated Contact with Patient 09/03/11 2038      Chief Complaint  Patient presents with  . Shortness of Breath    (Consider location/radiation/quality/duration/timing/severity/associated sxs/prior treatment) HPI Comments: Patient with a history of COPD comes in today with a chief complaint of chest tightness, wheezing, and shortness of breath that began yesterday.  She has tried using both her Albuterol inhaler and Albuterol neb at home, but no relief.  She does not currently smoke.  She is not on any estrogen containing medication, no prolonged travel in the past 4 weeks, no lower extremity edema or erythema, or prior history of DVT or PE.    Patient is a 56 y.o. female presenting with shortness of breath. The history is provided by the patient.  Shortness of Breath  The current episode started yesterday. The problem has been gradually worsening. The symptoms are relieved by nothing. The symptoms are aggravated by nothing. Associated symptoms include cough, shortness of breath and wheezing. Pertinent negatives include no chest pain and no fever.    Past Medical History  Diagnosis Date  . COPD (chronic obstructive pulmonary disease)     secondary to tobacco use  . Gastrointestinal hemorrhage     secondary to PUD on March 2008  . Peptic ulcer disease     +H. pylori (antigen)- Dr. Juanda Chance- treated  . ETOH abuse     Pt stopped in 3/08  . Tobacco abuse     Pt attempting to stop 8/12   . Sleep apnea     Past Surgical History  Procedure Date  . Tubal ligation   . Mandible surgery     due to trauma    Family History  Problem Relation Age of Onset  . COPD Sister   . Breast cancer Sister   . Diabetes Mother   . Coronary artery disease Mother     History  Substance Use Topics  . Smoking status: Former Smoker -- 0.4 packs/day    Types: Cigarettes    Quit date: 12/04/2010  . Smokeless tobacco: Not  on file   Comment: stopped 3 weeks ago  . Alcohol Use: Yes     2 beers    OB History    Grav Para Term Preterm Abortions TAB SAB Ect Mult Living                  Review of Systems  Constitutional: Negative for fever and chills.  Respiratory: Positive for cough, chest tightness, shortness of breath and wheezing.   Cardiovascular: Negative for chest pain, palpitations and leg swelling.  Gastrointestinal: Negative for abdominal pain.  Skin: Negative for rash.  Neurological: Negative for dizziness, syncope and light-headedness.  All other systems reviewed and are negative.    Allergies  Aspirin  Home Medications   Current Outpatient Rx  Name Route Sig Dispense Refill  . ALBUTEROL SULFATE HFA 108 (90 BASE) MCG/ACT IN AERS Inhalation Inhale 2 puffs into the lungs every 4 (four) hours as needed.    . ALBUTEROL SULFATE (2.5 MG/3ML) 0.083% IN NEBU Nebulization Take 2.5 mg by nebulization every 6 (six) hours as needed.    . DESLORATADINE 5 MG PO TABS Oral Take 5 mg by mouth daily.    [name] FLUTICASONE-SALMETEROL 500-50 MCG/DOSE IN AEPB Inhalation Inhale 1 puff into the lungs 2 (two) times daily.    [name] HYDROCORTISONE 1 % EX CREA Topical Apply  topically 3 (three) times daily. Apply to the affected area 3-4 times daily as needed.     . IPRATROPIUM BROMIDE 0.02 % IN SOLN Nebulization Take 500 mcg by nebulization 4 (four) times daily as needed.    [name] MONTELUKAST SODIUM 10 MG PO TABS Oral Take 10 mg by mouth daily.      [name] PANTOPRAZOLE SODIUM 40 MG PO TBEC Oral Take 40 mg by mouth daily.    . TRAZODONE 25 MG HALF TABLET Oral Take 25 mg by mouth at bedtime.    [name] EPINEPHRINE 0.15 MG/0.3ML IJ DEVI Intramuscular Inject 0.15 mg into the muscle as needed.      [name] EPINEPHRINE 0.3 MG/0.3ML IJ DEVI Intramuscular Inject 0.3 mg into the muscle once.      . MELOXICAM 7.5 MG PO TABS Oral Take 1 tablet (7.5 mg total) by mouth daily. 30 tablet 2  . MONTELUKAST SODIUM 10 MG PO TABS Oral Take 1 tablet (10 mg total)  by mouth at bedtime. 90 tablet 3    BP 124/84  Pulse 76  Temp(Src) 98.6 F (37 C) (Oral)  Resp 20  SpO2 100%  Physical Exam  Nursing note and vitals reviewed. Constitutional: She appears well-developed and well-nourished. No distress.  HENT:  Head: Normocephalic and atraumatic.  Mouth/Throat: Oropharynx is clear and moist.  Cardiovascular: Normal rate, regular rhythm, normal heart sounds and intact distal pulses.   Pulmonary/Chest: Effort normal. Not tachypneic. No respiratory distress. She has decreased breath sounds. She has wheezes. She has no rhonchi. She has no rales. She exhibits no tenderness.       Patient able to speak in full sentences Diffuse expiratory wheezing.  Abdominal: Soft. Bowel sounds are normal.  Neurological: She is alert.  Skin: Skin is warm and dry. She is not diaphoretic.  Psychiatric: She has a normal mood and affect.    ED Course  Procedures (including critical care time)  Labs Reviewed  CBC - Abnormal; Notable for the following:    RBC 5.33 (*)    All other components within normal limits  BASIC METABOLIC PANEL - Abnormal; Notable for the following:    GFR calc non Af Amer 69 (*)    GFR calc Af Amer 80 (*)    All other components within normal limits  TROPONIN I   Dg Chest 2 View  09/03/2011  *RADIOLOGY REPORT*  Clinical Data: Chest pain and shortness of breath.  CHEST - 2 VIEW  Comparison: Chest x-ray 12/10/2010.  Findings: Lungs appear hyperexpanded with flattening of the hemidiaphragms and increased retrosternal air space.  No consolidative airspace disease.  No pleural effusions.  Mild diffuse bronchial wall thickening is evident.  Pulmonary vasculature is normal.  Cardiomediastinal silhouette is within normal limits.  IMPRESSION: 1.  Hyperexpansion of the lungs with associated thickening of the bronchi.  Findings can be seen in the setting of asthma exacerbation or chronic bronchitis.  Original Report Authenticated By: Florencia Reasons, M.D.       No diagnosis found.  10:38 PM Reassessed patient.  She continues to have diffuse expiratory wheezing.  She reports that her SOB has improved somewhat.  Will order continuous neb. 11:58 PM Reassessed patient.  Patient is currently getting her continuous neb treatment. 12:34 AM Reassessed patient.  Patient reports that she feels "a whole lot better."   Patient denies any tightness in her chest.  Lungs CTAB.    MDM  Patient with history of COPD comes in today with a chief complaint of  chest tightness, wheezing, and SOB.  Diffuse wheezing on exam initially.  Patient given 60 mg Prednisone and breathing treatments while in ED and her symptoms significantly improved.  Patient discharged home with 5 day course of Prednisone.  Return precautions discussed with patient.          Pascal Lux [place], PA-C 09/05/11 [phone]

## 2011-09-04 MED ORDER — PREDNISONE 20 MG PO TABS: 60.0000 mg | ORAL_TABLET | Freq: Every day | ORAL | Status: AC

## 2011-09-04 NOTE — Discharge Instructions (Signed)

## 2011-09-05 NOTE — ED Provider Notes (Signed)
Medical screening examination/treatment/procedure(s) were performed by non-physician practitioner and as supervising physician I was immediately available for consultation/collaboration.   Gerhard Munch, MD 09/05/11 [phone]

## 2011-09-29 ENCOUNTER — Encounter: Payer: Self-pay | Admitting: Internal Medicine

## 2011-09-29 ENCOUNTER — Ambulatory Visit (INDEPENDENT_AMBULATORY_CARE_PROVIDER_SITE_OTHER): Payer: Self-pay | Admitting: Internal Medicine

## 2011-09-29 VITALS — BP 116/82 | HR 88 | Temp 97.3°F | Ht 60.0 in | Wt 213.2 lb

## 2011-09-29 DIAGNOSIS — N393 Stress incontinence (female) (male): Secondary | ICD-10-CM

## 2011-09-29 DIAGNOSIS — N39498 Other specified urinary incontinence: Secondary | ICD-10-CM

## 2011-09-29 DIAGNOSIS — B352 Tinea manuum: Secondary | ICD-10-CM | POA: Insufficient documentation

## 2011-09-29 NOTE — Progress Notes (Signed)
Subjective:     Patient ID: Maria Nelson, female   DOB: 1955/10/27, 56 y.o.   MRN: [id number]  Rash This is a new problem. The current episode started 1 to 4 weeks ago. The problem has been gradually worsening since onset. The affected locations include the right hand, right wrist, left wrist and left hand. The rash is characterized by itchiness, redness and scaling. She was exposed to nothing. Associated symptoms include nail changes. Pertinent negatives include no cough, eye pain, joint pain, shortness of breath, sore throat or vomiting. Past treatments include topical steroids. Improvement on treatment: rash worsened with application of 1% hydrocortisone cream. Her past medical history is significant for asthma. There is no history of eczema.   Stress incontinence. Patient states she has a long history (greater than 5 years) of stress incontinence. She states that she leaks a small amount of urine whenever she gets excited, coughs, sneezes, laughs or otherwise has increased abdominal pressure. She has a history of 3 childbirths. No abdominal or pelvic surgeries. She has briefly tried Cagle exercises. She states these were ineffective. She is concerned that this is affecting her ability to interact socially. She denies dysuria or urinary frequency.  Review of Systems  HENT: Negative for sore throat.   Eyes: Negative for pain.  Respiratory: Negative for cough and shortness of breath.   Gastrointestinal: Negative for vomiting.  Genitourinary: Positive for difficulty urinating. Negative for dysuria, frequency and flank pain.  Musculoskeletal: Negative for joint pain.  Skin: Positive for nail changes and rash.       Objective:   Physical Exam  Nursing note and vitals reviewed. Constitutional: No distress.  Cardiovascular: Normal rate and regular rhythm.   Pulmonary/Chest: Effort normal and breath sounds normal. No respiratory distress.  Skin: Rash noted. She is not diaphoretic.   Erythematous bordered scaly plaques with scale present on the bilateral wrists both dorsal and volar sides bilaterally. The hands are scaly. Her fingernails are thickened and dark at the corners.   Filed Vitals:   09/29/11 1349  BP: 116/82  Pulse: 88  Temp: 97.3 F (36.3 C)   Filed Vitals:   09/29/11 1349  Height: 5' (1.524 m)  Weight: 213 lb 3.2 oz (96.707 kg)       Assessment:     Case discussed with Dr. Aundria Rud. Please see problem oriented charting for assessment and plan by problem (best viewed under encounters tab).

## 2011-09-29 NOTE — Assessment & Plan Note (Signed)
Informed the patient that stress incontinence has no affective pharmacologic treatments. The most effective treatment for weight loss and Kegel exercising. I suggested a treatment course of Kegel exercises, prior to surgical referral. Maria Nelson was upset that we will were not referring her directly to a surgeon. However I feel it is appropriate to try conservative therapy prior to surgical referral. She is to regularly perform to go exercises for one month as directed and we will evaluate if her incontinence has improved. I urged her to make a good faith effort as surgery has definite risks and the potential for complications.

## 2011-09-29 NOTE — Assessment & Plan Note (Addendum)
Patient has lesions that appear to be due to tinea. She also has tinea pedis and has had tinea Manus for a long period of time. This is likely spread to her wrists and has been worsened with corticosteroid use. I suggested she take terbinafine 1% cream twice a day apply to the affected areas +1 inch margin for one month. If rash is not improved we will refer her to dermatology. She is to discontinue using hydrocortisone cream.

## 2011-09-29 NOTE — Patient Instructions (Addendum)
Please buy over-the-counter terbinafine 1% cream. Apply this cream twice daily, once in the morning and once in the evening to the itchy red areas. Be sure to include a 1 inch ring around any areas that are affected. Treat this for 4 weeks. If you're not improve to please call and be seen again. Stop using hydrocortisone cream. You can take  desloratadine to help with your itching.  Please perform Kegel exercises for your stress incontinence. This should help you control your bladder more effectively. If this is ineffective after 3 months, we will refer you to a urologist or OB/GYN for further management. In the meantime we will check a urinalysis to see if you have a urinary tract infection. Infection can make incontinence worse. If it turns out you have an infection, we will call you in a prescription for antibiotics.  INTRODUCTION -- The pelvic muscles work to control the release of urine. Like other muscles, they can become weakened over time as a result of childbirth, surgery, and aging. People with bladder control problems can improve urinary control through pelvic muscle exercises (also called Kegel exercises). If you want to use pelvic muscle exercises, speak to your healthcare provider to determine if the exercises would be helpful, and also to receive instructions about how to perform the exercises correctly. PELVIC MUSCLE EXERCISE TECHNIQUE First, you will need to learn which muscles to tighten. To do this, tighten your pelvic muscles while you are urinating to stop the flow of urine. Tighten and relax your muscles to start and stop your flow of urine several times. That way you will learn which muscles are the ones you should tighten. Do this ONLY a few times while you are learning. Normally, when you do the exercises you should NOT stop the flow of your urine, because this can hurt your bladder.   After you learn which muscles to tighten, you can do the exercises in any position (sitting in a  chair or lying down). You do not need to do them while you are in the bathroom.  Second, hold the pelvic muscle contraction approximately 8 to 10 seconds, and then relax the muscles; relaxing the muscles is as important as contracting. In the beginning, it may not be possible to hold the contraction for more than one second.  Perform 8 to 12 exercises three times per day. Try to do this every day, but no less than three or four times a week. Continue this regimen for at least 15 to 20 weeks.  Over time, try to hold the contraction harder and for a longer time. You will need to continue these exercises indefinitely to have a lasting effect, similar to other forms of exercise. Some people benefit from working with a physical therapist or nurse to receive more detailed instructions and to ensure that you use the correct technique. In addition, these providers may use biofeedback to improve your exercise technique and strength. Biofeedback uses a computer monitor to show you as the muscles contract and relax, and also indicates if you use the wrong muscles. PELVIC MUSCLE EXERCISE RESULTS -- Studies have shown that, if done correctly, pelvic muscle exercises can be effective in the following situations: If you leak urine with coughing/laughing/sneezing. Contract your muscles when you are about to cough/laugh/sneeze to avoid leaking.  If you have a sudden urge to run to the bathroom, sit or stand still and contract your pelvic muscles. After the urge diminishes, you can then proceed to the toilet.  If  you have mild fecal leakage (leakage of stool). These exercises are less helpful for people with severe leakage with laughing/coughing/sneezing. Exercises are not helpful at all for people with other types of urinary incontinence, including overflow incontinence (when the bladder cannot empty completely and leaks when it becomes overly full).  Most people notice an improvement after three to four months of  practicing pelvic muscle exercises. If these exercises are not helpful, please speak with a healthcare provider. Other treatments are available and may be recommended.

## 2011-12-03 ENCOUNTER — Other Ambulatory Visit: Payer: Self-pay | Admitting: *Deleted

## 2011-12-03 MED ORDER — EPINEPHRINE 0.3 MG/0.3ML IJ DEVI: 0.3000 mg | Freq: Once | INTRAMUSCULAR | Status: DC

## 2011-12-06 NOTE — Telephone Encounter (Signed)
Epipen rx called to Diane at [facility].

## 2011-12-22 NOTE — Addendum Note (Signed)
Addended by: Bufford Spikes on: 12/22/2011 12:19 PM   Modules accepted: Orders

## 2012-02-16 ENCOUNTER — Other Ambulatory Visit: Payer: Self-pay | Admitting: *Deleted

## 2012-02-16 MED ORDER — IPRATROPIUM BROMIDE 0.02 % IN SOLN: 500.0000 ug | Freq: Four times a day (QID) | RESPIRATORY_TRACT | Status: DC | PRN

## 2012-02-16 NOTE — Addendum Note (Signed)
Addended by: Linward Headland on: 02/16/2012 07:09 PM   Modules accepted: Orders

## 2012-02-16 NOTE — Telephone Encounter (Signed)
Please phone in to pharmacy 

## 2012-02-17 NOTE — Telephone Encounter (Signed)
Rx was called in to [facility] - did Rx with 11 refills. Stanton Kidney Waino Mounsey RN 02/17/12 3:30PM

## 2012-03-20 ENCOUNTER — Emergency Department (INDEPENDENT_AMBULATORY_CARE_PROVIDER_SITE_OTHER)
Admission: EM | Admit: 2012-03-20 | Discharge: 2012-03-20 | Disposition: A | Discharge status: Home / Self Care | Payer: No Typology Code available for payment source | Source: Home / Self Care | Attending: Emergency Medicine | Admitting: Emergency Medicine

## 2012-03-20 ENCOUNTER — Emergency Department (HOSPITAL_COMMUNITY)
Admission: RE | Admit: 2012-03-20 | Discharge: 2012-03-20 | Disposition: A | Discharge status: Home / Self Care | Payer: No Typology Code available for payment source | Source: Ambulatory Visit | Attending: Emergency Medicine | Admitting: Emergency Medicine

## 2012-03-20 ENCOUNTER — Encounter (HOSPITAL_COMMUNITY): Payer: Self-pay | Admitting: Emergency Medicine

## 2012-03-20 DIAGNOSIS — S60559A Superficial foreign body of unspecified hand, initial encounter: Secondary | ICD-10-CM | POA: Insufficient documentation

## 2012-03-20 DIAGNOSIS — X58XXXA Exposure to other specified factors, initial encounter: Secondary | ICD-10-CM | POA: Insufficient documentation

## 2012-03-20 MED ORDER — TRAMADOL HCL 50 MG PO TABS: 100.0000 mg | ORAL_TABLET | Freq: Three times a day (TID) | ORAL | Status: DC | PRN

## 2012-03-20 NOTE — ED Notes (Signed)
Reports glass was broken on Saturday and patient tried picking up broken pieces.  One piece got stuck in patient's right hand and she pulled it out but the hand still feels as if glass is in it.  Patient says she is unable to move thumb without feeling like she is pressing down on glass and hurts.  Patient says right hand gets numbs and hurts.

## 2012-03-20 NOTE — ED Provider Notes (Signed)
Chief Complaint  Patient presents with  . Hand Injury    History of Present Illness:   The patient is a 56 year old female who thinks she got a glass splinter in the webspace between her thumb and index finger of her right hand 3 days ago. She can feel pain in the area. She states it hurts to move her thumb. Her last tetanus shot was 3 years ago. She's had a long-standing history of what sounds like carpal tunnel syndrome and has ongoing numbness and tingling in her thumb and index finger.  Review of Systems:  Other than noted above, the patient denies any of the following symptoms: Systemic:  No fevers, chills, sweats, or aches.  No fatigue or tiredness. Musculoskeletal:  No joint pain, arthritis, bursitis, swelling, back pain, or neck pain. Neurological:  No muscular weakness, paresthesias, headache, or trouble with speech or coordination.  No dizziness.  PMFSH:  Past medical history, family history, social history, meds, and allergies were reviewed.  Physical Exam:   Vital signs:  BP 135/94  Pulse 76  Temp 98.1 F (36.7 C) (Oral)  Resp 16  SpO2 97% Gen:  Alert and oriented times 3.  In no distress. Musculoskeletal: There is no puncture wound, no visible or palpable foreign body. There is tenderness to palpation over the webspace between the thumb and index finger. Otherwise, all joints had a full a ROM with no swelling, bruising or deformity.  No edema, pulses full. Extremities were warm and pink.  Capillary refill was brisk.  Skin:  Clear, warm and dry.  No rash. Neuro:  Alert and oriented times 3.  Muscle strength was normal.  Sensation was intact to light touch.   Radiology:  Dg Hand Complete Right  03/20/2012  *RADIOLOGY REPORT*  Clinical Data: Rule out foreign body  RIGHT HAND - COMPLETE 3+ VIEW  Comparison: None.  Findings: Three views of the right hand submitted.  No acute fracture or subluxation.  No radiopaque foreign body.  IMPRESSION: No acute fracture or subluxation.  No  radiopaque foreign body.   Original Report Authenticated By: Natasha Mead, M.D.    I reviewed the images independently and personally and concur with the radiologist's findings.  Assessment:  The encounter diagnosis was Foreign body in hand.  I cannot palpate or see any foreign body on x-ray. I'm not inclined to go exploring for a foreign body that I can not identify. I suggested she soak in warm water and see a hand specialist as soon as possible and  Plan:   1.  The following meds were prescribed:   New Prescriptions   TRAMADOL (ULTRAM) 50 MG TABLET    Take 2 tablets (100 mg total) by mouth every 8 (eight) hours as needed for pain.   2.  The patient was instructed in symptomatic care, including rest and activity, elevation, application of ice and compression.  Appropriate handouts were given. 3.  The patient was told to return if becoming worse in any way, if no better in 3 or 4 days, and given some red flag symptoms that would indicate earlier return.   4.  The patient was told to follow up with Dr. Dairl Ponder later on this week.    Reuben Likes, MD 03/20/12 [phone]

## 2012-03-20 NOTE — ED Notes (Signed)
Ryan in radiology made aware patient was on the way

## 2012-03-20 NOTE — ED Notes (Signed)
Radiology called statin patient was ready to be picked up. Shuttle notified

## 2012-05-22 ENCOUNTER — Other Ambulatory Visit: Payer: Self-pay | Admitting: *Deleted

## 2012-05-22 MED ORDER — FLUTICASONE-SALMETEROL 500-50 MCG/DOSE IN AEPB: 1.0000 | INHALATION_SPRAY | Freq: Two times a day (BID) | RESPIRATORY_TRACT | Status: DC

## 2012-05-23 NOTE — Telephone Encounter (Signed)
Rx faxed in.

## 2012-06-06 ENCOUNTER — Other Ambulatory Visit: Payer: Self-pay | Admitting: *Deleted

## 2012-06-07 ENCOUNTER — Ambulatory Visit: Payer: Self-pay

## 2012-06-07 MED ORDER — PANTOPRAZOLE SODIUM 40 MG PO TBEC: 40.0000 mg | DELAYED_RELEASE_TABLET | Freq: Every day | ORAL | Status: DC

## 2012-06-07 NOTE — Telephone Encounter (Signed)
Rx faxed in.

## 2012-06-30 ENCOUNTER — Encounter (HOSPITAL_COMMUNITY): Payer: Self-pay | Admitting: Cardiology

## 2012-06-30 ENCOUNTER — Emergency Department (HOSPITAL_COMMUNITY): Payer: No Typology Code available for payment source

## 2012-06-30 ENCOUNTER — Emergency Department (HOSPITAL_COMMUNITY)
Admission: EM | Admit: 2012-06-30 | Discharge: 2012-06-30 | Disposition: A | Discharge status: Home / Self Care | Payer: No Typology Code available for payment source | Attending: Emergency Medicine | Admitting: Emergency Medicine

## 2012-06-30 DIAGNOSIS — Z8719 Personal history of other diseases of the digestive system: Secondary | ICD-10-CM | POA: Insufficient documentation

## 2012-06-30 DIAGNOSIS — M25539 Pain in unspecified wrist: Secondary | ICD-10-CM | POA: Insufficient documentation

## 2012-06-30 DIAGNOSIS — IMO0002 Reserved for concepts with insufficient information to code with codable children: Secondary | ICD-10-CM | POA: Insufficient documentation

## 2012-06-30 DIAGNOSIS — Z87891 Personal history of nicotine dependence: Secondary | ICD-10-CM | POA: Insufficient documentation

## 2012-06-30 DIAGNOSIS — Z79899 Other long term (current) drug therapy: Secondary | ICD-10-CM | POA: Insufficient documentation

## 2012-06-30 DIAGNOSIS — K279 Peptic ulcer, site unspecified, unspecified as acute or chronic, without hemorrhage or perforation: Secondary | ICD-10-CM | POA: Insufficient documentation

## 2012-06-30 DIAGNOSIS — M19039 Primary osteoarthritis, unspecified wrist: Secondary | ICD-10-CM | POA: Insufficient documentation

## 2012-06-30 DIAGNOSIS — J449 Chronic obstructive pulmonary disease, unspecified: Secondary | ICD-10-CM | POA: Insufficient documentation

## 2012-06-30 DIAGNOSIS — Z8669 Personal history of other diseases of the nervous system and sense organs: Secondary | ICD-10-CM | POA: Insufficient documentation

## 2012-06-30 DIAGNOSIS — G8929 Other chronic pain: Secondary | ICD-10-CM

## 2012-06-30 DIAGNOSIS — J4489 Other specified chronic obstructive pulmonary disease: Secondary | ICD-10-CM | POA: Insufficient documentation

## 2012-06-30 MED ORDER — MELOXICAM 15 MG PO TABS: 15.0000 mg | ORAL_TABLET | Freq: Every day | ORAL | Status: DC

## 2012-06-30 NOTE — ED Notes (Signed)
Pt reports left wrist pain that has increased over the past couple of months. States she has been wearing a brace on her wrist but the pain is getting worse. Denies any injury.

## 2012-06-30 NOTE — ED Provider Notes (Signed)
History     CSN: [id number]  Arrival date & time 06/30/12  1112   First MD Initiated Contact with Patient 06/30/12 1224      Chief Complaint  Patient presents with  . Wrist Pain    (Consider location/radiation/quality/duration/timing/severity/associated sxs/prior treatment) The history is provided by the patient. No language interpreter was used.    Maria Nelson is a 57 year old female with a past medical history of COPD, peptic ulcer disease, presents emergency Department with chief complaint of left wrist and thumb pain.  Patient has had problems with this previously.  She's been taking Advil on a daily basis for treatment.  Patient states that the pain is worse in the morning but begins to get better throughout the day.  She denies a history of inflammatory joint disease.   Past Medical History  Diagnosis Date  . COPD (chronic obstructive pulmonary disease)     secondary to tobacco use  . Gastrointestinal hemorrhage     secondary to PUD on March 2008  . Peptic ulcer disease     +H. pylori (antigen)- Dr. Juanda Chance- treated  . ETOH abuse     Pt stopped in 3/08  . Tobacco abuse     Pt attempting to stop 8/12   . Sleep apnea     Past Surgical History  Procedure Laterality Date  . Tubal ligation    . Mandible surgery      due to trauma    Family History  Problem Relation Age of Onset  . COPD Sister   . Breast cancer Sister   . Diabetes Mother   . Coronary artery disease Mother     History  Substance Use Topics  . Smoking status: Former Smoker -- 0.40 packs/day    Types: Cigarettes    Quit date: 12/04/2010  . Smokeless tobacco: Not on file     Comment: stopped 3 weeks ago  . Alcohol Use: Yes     Comment: 2 beers    OB History   Grav Para Term Preterm Abortions TAB SAB Ect Mult Living                  Review of Systems  Constitutional: Negative for fever and chills.  HENT: Negative for trouble swallowing.   Respiratory: Negative for shortness of  breath.   Cardiovascular: Negative for chest pain.  Gastrointestinal: Negative for nausea, vomiting, abdominal pain, diarrhea and constipation.  Genitourinary: Negative for dysuria and hematuria.  Musculoskeletal: Positive for arthralgias. Negative for myalgias.  Skin: Negative for rash.  Neurological: Negative for numbness.  All other systems reviewed and are negative.    Allergies  Bee venom and Aspirin  Home Medications   Current Outpatient Rx  Name  Route  Sig  Dispense  Refill  . albuterol (PROVENTIL HFA;VENTOLIN HFA) 108 (90 BASE) MCG/ACT inhaler   Inhalation   Inhale 2 puffs into the lungs every 4 (four) hours as needed for shortness of breath.          . desloratadine (CLARINEX) 5 MG tablet   Oral   Take 5 mg by mouth daily.         [name] EPINEPHrine (EPIPEN) 0.3 mg/0.3 mL DEVI   Intramuscular   Inject 0.3 mg into the muscle once as needed (allergic reaction).         . Fluticasone-Salmeterol (ADVAIR) 500-50 MCG/DOSE AEPB   Inhalation   Inhale 1 puff into the lungs 2 (two) times daily.   60 each   11   .  ipratropium (ATROVENT) 0.02 % nebulizer solution   Nebulization   Take 500 mcg by nebulization 4 (four) times daily as needed (shortness of breath.).         [name] pantoprazole (PROTONIX) 40 MG tablet   Oral   Take 1 tablet (40 mg total) by mouth daily.   30 tablet   11   . traMADol (ULTRAM) 50 MG tablet   Oral   Take 2 tablets (100 mg total) by mouth every 8 (eight) hours as needed for pain.   30 tablet   0   . hydrocortisone 1 % cream   Topical   Apply 1 application topically 4 (four) times daily as needed (itching). Apply to the affected area 3-4 times daily as needed.         . traZODone (DESYREL) 25 mg TABS   Oral   Take 25 mg by mouth every other day.            BP 128/82  Pulse 65  Temp(Src) 98 F (36.7 C) (Oral)  Resp 18  SpO2 96%  Physical Exam  Constitutional: She is oriented to person, place, and time. She appears  well-developed and well-nourished. No distress.  HENT:  Head: Normocephalic and atraumatic.  Edentulous  Eyes: Conjunctivae are normal. No scleral icterus.  Neck: Normal range of motion.  Cardiovascular: Normal rate, regular rhythm and normal heart sounds.  Exam reveals no gallop and no friction rub.   No murmur heard. Pulmonary/Chest: Effort normal and breath sounds normal. No respiratory distress.  Abdominal: Soft. Bowel sounds are normal. She exhibits no distension and no mass. There is no tenderness. There is no guarding.  Musculoskeletal:  The patient is tender to palpation of the  left CMC and the thenar eminence.  He has grip strength 5 out of 5.  Full range of motion of the fingers.  Pulses are intact.  No heat redness or swelling noted.    Neurological: She is alert and oriented to person, place, and time.  Skin: Skin is warm and dry. She is not diaphoretic.    ED Course  Procedures (including critical care time)  Labs Reviewed - No data to display Dg Wrist 2 Views Left  06/30/2012  *RADIOLOGY REPORT*  Clinical Data: Pain.  No known injury.  LEFT WRIST - 2 VIEW  Comparison: None.  Findings: No acute bony or joint abnormality is identified.  Mild degenerative change of the first [facility] joint is noted.  Soft tissues are unremarkable.  IMPRESSION: Mild appearing first CMC osteoarthritis.  Otherwise negative.   Original Report Authenticated By: Holley Dexter, M.D.      1. Wrist pain, chronic, left       MDM  1:02 PM With x-ray that shows degenerative arthritis of the left CMC.  I am placing the patient in thumb spica splint.  I believe That she will be able to tolerate 10 days of Mobic given that she has been taking Advil on a daily basis, though I do realize the patient is at risk for GI bleed.  I have advised the patient to discontinue-if she experiences any abdominal discomfort.  Followup with PCP and orthopedics.       Arthor Captain, PA-C 07/02/12 1836

## 2012-06-30 NOTE — ED Notes (Signed)
C/o left wrist pain "for months", denies injury. 2+ radial pulse. No edema, redness noted. States pain worst in the morning & night

## 2012-06-30 NOTE — ED Notes (Signed)
Ortho tech aware of need for thumb spica

## 2012-06-30 NOTE — Progress Notes (Signed)
Orthopedic Tech Progress Note Patient Details:  Maria Nelson Jun 02, 1955 [id number] Velcro thumb spica applied to hand. Tolerated well.  Ortho Devices Type of Ortho Device: Thumb velcro splint Ortho Device/Splint Interventions: Application   Asia R Thompson 06/30/2012, 1:38 PM

## 2012-07-04 NOTE — ED Provider Notes (Signed)
Medical screening examination/treatment/procedure(s) were performed by non-physician practitioner and as supervising physician I was immediately available for consultation/collaboration.   Gavin Pound. Oletta Lamas, MD 07/04/12 [id number]

## 2012-07-12 ENCOUNTER — Other Ambulatory Visit: Payer: Self-pay | Admitting: Internal Medicine

## 2012-07-12 DIAGNOSIS — Z803 Family history of malignant neoplasm of breast: Secondary | ICD-10-CM

## 2012-07-14 ENCOUNTER — Ambulatory Visit (HOSPITAL_COMMUNITY)
Admission: RE | Admit: 2012-07-14 | Discharge: 2012-07-14 | Disposition: A | Discharge status: Home / Self Care | Payer: No Typology Code available for payment source | Source: Ambulatory Visit | Attending: Internal Medicine | Admitting: Internal Medicine

## 2012-07-14 DIAGNOSIS — Z1231 Encounter for screening mammogram for malignant neoplasm of breast: Secondary | ICD-10-CM | POA: Insufficient documentation

## 2012-07-14 DIAGNOSIS — Z803 Family history of malignant neoplasm of breast: Secondary | ICD-10-CM

## 2012-08-09 ENCOUNTER — Encounter: Payer: Self-pay | Admitting: Internal Medicine

## 2012-08-09 ENCOUNTER — Ambulatory Visit (INDEPENDENT_AMBULATORY_CARE_PROVIDER_SITE_OTHER): Payer: Medicaid Other | Admitting: Internal Medicine

## 2012-08-09 ENCOUNTER — Other Ambulatory Visit (HOSPITAL_COMMUNITY)
Admission: RE | Admit: 2012-08-09 | Discharge: 2012-08-09 | Disposition: A | Discharge status: Home / Self Care | Payer: Medicaid Other | Source: Ambulatory Visit | Attending: Internal Medicine | Admitting: Internal Medicine

## 2012-08-09 VITALS — BP 131/82 | HR 74 | Temp 98.1°F | Ht 62.0 in | Wt 204.7 lb

## 2012-08-09 DIAGNOSIS — K279 Peptic ulcer, site unspecified, unspecified as acute or chronic, without hemorrhage or perforation: Secondary | ICD-10-CM

## 2012-08-09 DIAGNOSIS — Z0000 Encounter for general adult medical examination without abnormal findings: Secondary | ICD-10-CM

## 2012-08-09 DIAGNOSIS — Z124 Encounter for screening for malignant neoplasm of cervix: Secondary | ICD-10-CM

## 2012-08-09 DIAGNOSIS — M654 Radial styloid tenosynovitis [de Quervain]: Secondary | ICD-10-CM

## 2012-08-09 DIAGNOSIS — Z1151 Encounter for screening for human papillomavirus (HPV): Secondary | ICD-10-CM | POA: Insufficient documentation

## 2012-08-09 DIAGNOSIS — Z01419 Encounter for gynecological examination (general) (routine) without abnormal findings: Secondary | ICD-10-CM | POA: Insufficient documentation

## 2012-08-09 DIAGNOSIS — Z8711 Personal history of peptic ulcer disease: Secondary | ICD-10-CM

## 2012-08-09 MED ORDER — DICLOFENAC SODIUM 1 % TD GEL: 2.0000 g | Freq: Four times a day (QID) | TRANSDERMAL | Status: DC

## 2012-08-09 MED ORDER — ALBUTEROL SULFATE (2.5 MG/3ML) 0.083% IN NEBU: 2.5000 mg | INHALATION_SOLUTION | Freq: Four times a day (QID) | RESPIRATORY_TRACT | Status: DC | PRN

## 2012-08-09 MED ORDER — IPRATROPIUM BROMIDE 0.02 % IN SOLN: 500.0000 ug | Freq: Four times a day (QID) | RESPIRATORY_TRACT | Status: DC | PRN

## 2012-08-09 NOTE — Assessment & Plan Note (Signed)
The patient presents with left wrist pain, with pain on palpation of radial styloid and a quite positive finklestein's test, consistent with DeQuervain's tenosynovitis.  No evidence to suggest septic joint (afebrile, no warmth or erythema at joint) vs gout (no history of gout, pain more located in tendon than joint) vs arthritis.  We discussed options, including injection vs conservative therapy vs sports med referral, and the patient selected conservative therapy. -ice for 10 minutes on, 10 minutes off -wrist splint -voltaren gel -wrist exercises -if pain doesn't improve, patient informed to return for re-evaluation.

## 2012-08-09 NOTE — Assessment & Plan Note (Signed)
Pap smear performed today. Will follow up results.  

## 2012-08-09 NOTE — Patient Instructions (Addendum)
General Instructions: Your wrist pain may be caused by a condition called dequervain's tenosynovitis.  This can be managed with pain medications and splinting.  If this does not work, we can try injections of the joint, which may provide pain relief. -apply ice to the area, for no more than 10 minutes at a time -apply voltaren gel to the area up to 4 times per day -continue Tramadol as needed for pain control -practice the Finklestein test (see attached paper) at home to stretch the tendon  We performed a pap smear today.  We will call you if the results are abnormal.  Please return for a follow-up visit in 6 months, or sooner if needed   Treatment Goals:  Goals (1 Years of Data) as of 08/09/12   None      Progress Toward Treatment Goals:    Self Care Goals & Plans:       Care Management & Community Referrals:  Referral 08/09/2012  Referrals made to community resources none     De Quervain's Disease Tommi Rumps Quervain's disease is a condition often seen in racquet sports where there is a soreness (inflammation) in the cord like structures (tendons) which attach muscle to bone on the thumb side of the wrist. There may be a tightening of the tissuesaround the tendons. This condition is often helped by giving up or modifying the activity which caused it. When conservative treatment does not help, surgery may be required. Conservative treatment could include changes in the activity which brought about the problem or made it worse. Anti-inflammatory medications and injections may be used to help decrease the inflammation and help with pain control. Your caregiver will help you determine which is best for you. DIAGNOSIS  Often the diagnosis (learning what is wrong) can be made by examination. Sometimes x-rays are required. HOME CARE INSTRUCTIONS   Apply ice to the sore area for 15 to 20 minutes, 3 to 4 times per day while awake. Put the ice in a plastic bag and place a towel between the  bag of ice and your skin. This is especially helpful if it can be done after all activities involving the sore wrist.  Temporary splinting may help.  Only take over-the-counter or prescription medicines for pain, discomfort or fever as directed by your caregiver. SEEK MEDICAL CARE IF:   Pain relief is not obtained with medications, or if you have increasing pain and seem to be getting worse rather than better. MAKE SURE YOU:   Understand these instructions.  Will watch your condition.  Will get help right away if you are not doing well or get worse. Document Released: 12/29/2000 Document Revised: 06/28/2011 Document Reviewed: 04/05/2005 [facility] Patient Information 2013 [place], [place].

## 2012-08-09 NOTE — Progress Notes (Signed)
HPI The patient is a 57 y.o. female with a history of COPD, PUD with prior GI hemorrhage, stress incontinence, presenting for a pap smear and with L wrist pain.  The patient notes a 5-month history of left wrist pain.  The pain has not improved.  Pain is worse at night, and worse with weight bearing.  She notes some pain the morning, but no true morning stiffness.  She was given mobic in the ED 1 month ago for this issue, which did not improve the pain.  She notes some weakness in her L wrist secondary to pain, but no numbness or tingling in her fingers.  Running her hand under warm water makes the pain a little better.  She notes no trauma at the time of onset, though she does note that she plays with her nieces and nephews regularly.  She has been wearing a brace with solid support, which helps some.  X-rays performed in the ED showed no abnormality.  She notes no fevers or chills at home.  The patient requests a pap smear.  Her last smear was 2 years ago, and was normal.  This is the only pap smear on file.  She notes no vaginal discharge, itching, or burning.  ROS: General: no fevers, chills, changes in weight, changes in appetite Skin: no rash HEENT: no blurry vision, hearing changes, sore throat Pulm: no dyspnea, coughing, wheezing CV: no chest pain, palpitations, shortness of breath Abd: no abdominal pain, nausea/vomiting, diarrhea/constipation GU: no dysuria, hematuria, polyuria Ext: no arthralgias, myalgias Neuro: no weakness, numbness, or tingling  Filed Vitals:   08/09/12 1321  BP: 131/82  Pulse: 74  Temp: 98.1 F (36.7 C)    PEX General: alert, cooperative, and in no apparent distress HEENT: pupils equal round and reactive to light, vision grossly intact, oropharynx clear and non-erythematous  Neck: supple, no lymphadenopathy Lungs: clear to ascultation bilaterally, normal work of respiration, no wheezes, rales, ronchi Heart: regular rate and rhythm, no murmurs, gallops, or  rubs Abdomen: soft, non-tender, non-distended, normal bowel sounds Extremities: Left wrist tender to palpation at the radial styloid.  Finklestein's test was extremely positive.  No redness or warmth noted at site. Pelvic: No external abrasions noted, vaginal mucosa unremarkable with no discharge, cervical os visualized Neurologic: alert & oriented X3, cranial nerves II-XII intact, strength grossly intact, sensation intact to light touch  Current Outpatient Prescriptions on File Prior to Visit  Medication Sig Dispense Refill  . albuterol (PROVENTIL HFA;VENTOLIN HFA) 108 (90 BASE) MCG/ACT inhaler Inhale 2 puffs into the lungs every 4 (four) hours as needed for shortness of breath.       . desloratadine (CLARINEX) 5 MG tablet Take 5 mg by mouth daily.      [name] EPINEPHrine (EPIPEN) 0.3 mg/0.3 mL DEVI Inject 0.3 mg into the muscle once as needed (allergic reaction).      . Fluticasone-Salmeterol (ADVAIR) 500-50 MCG/DOSE AEPB Inhale 1 puff into the lungs 2 (two) times daily.  60 each  11  . hydrocortisone 1 % cream Apply 1 application topically 4 (four) times daily as needed (itching). Apply to the affected area 3-4 times daily as needed.      [name] ipratropium (ATROVENT) 0.02 % nebulizer solution Take 500 mcg by nebulization 4 (four) times daily as needed (shortness of breath.).      [name] meloxicam (MOBIC) 15 MG tablet Take 1 tablet (15 mg total) by mouth daily. Take 1 daily with food.  10 tablet  0  .  pantoprazole (PROTONIX) 40 MG tablet Take 1 tablet (40 mg total) by mouth daily.  30 tablet  11  . traMADol (ULTRAM) 50 MG tablet Take 2 tablets (100 mg total) by mouth every 8 (eight) hours as needed for pain.  30 tablet  0  . traZODone (DESYREL) 25 mg TABS Take 25 mg by mouth every other day.        No current facility-administered medications on file prior to visit.    Assessment/Plan

## 2012-08-12 NOTE — Progress Notes (Signed)
Case discussed with Dr. Brown at the time of the visit.  We reviewed the resident's history and exam and pertinent patient test results.  I agree with the assessment, diagnosis and plan of care documented in the resident's note. 

## 2012-09-08 ENCOUNTER — Emergency Department (HOSPITAL_COMMUNITY): Payer: Medicaid Other

## 2012-09-08 ENCOUNTER — Inpatient Hospital Stay (HOSPITAL_COMMUNITY)
Admission: EM | Admit: 2012-09-08 | Discharge: 2012-09-11 | DRG: 191 | Disposition: A | Discharge status: Home / Self Care | Payer: Medicaid Other | Attending: Internal Medicine | Admitting: Internal Medicine

## 2012-09-08 ENCOUNTER — Encounter (HOSPITAL_COMMUNITY): Payer: Self-pay | Admitting: *Deleted

## 2012-09-08 DIAGNOSIS — I498 Other specified cardiac arrhythmias: Secondary | ICD-10-CM | POA: Diagnosis present

## 2012-09-08 DIAGNOSIS — Z91199 Patient's noncompliance with other medical treatment and regimen due to unspecified reason: Secondary | ICD-10-CM

## 2012-09-08 DIAGNOSIS — J45909 Unspecified asthma, uncomplicated: Secondary | ICD-10-CM | POA: Diagnosis present

## 2012-09-08 DIAGNOSIS — R000 Tachycardia, unspecified: Secondary | ICD-10-CM | POA: Diagnosis present

## 2012-09-08 DIAGNOSIS — G8929 Other chronic pain: Secondary | ICD-10-CM | POA: Diagnosis present

## 2012-09-08 DIAGNOSIS — J440 Chronic obstructive pulmonary disease with acute lower respiratory infection: Secondary | ICD-10-CM | POA: Diagnosis present

## 2012-09-08 DIAGNOSIS — Z8711 Personal history of peptic ulcer disease: Secondary | ICD-10-CM | POA: Diagnosis present

## 2012-09-08 DIAGNOSIS — J449 Chronic obstructive pulmonary disease, unspecified: Secondary | ICD-10-CM | POA: Diagnosis present

## 2012-09-08 DIAGNOSIS — G4733 Obstructive sleep apnea (adult) (pediatric): Secondary | ICD-10-CM | POA: Diagnosis present

## 2012-09-08 DIAGNOSIS — R0609 Other forms of dyspnea: Secondary | ICD-10-CM | POA: Diagnosis present

## 2012-09-08 DIAGNOSIS — K219 Gastro-esophageal reflux disease without esophagitis: Secondary | ICD-10-CM

## 2012-09-08 DIAGNOSIS — R0603 Acute respiratory distress: Secondary | ICD-10-CM

## 2012-09-08 DIAGNOSIS — J4489 Other specified chronic obstructive pulmonary disease: Secondary | ICD-10-CM | POA: Diagnosis present

## 2012-09-08 DIAGNOSIS — Z6841 Body Mass Index (BMI) 40.0 and over, adult: Secondary | ICD-10-CM | POA: Diagnosis present

## 2012-09-08 DIAGNOSIS — Z9119 Patient's noncompliance with other medical treatment and regimen: Secondary | ICD-10-CM

## 2012-09-08 DIAGNOSIS — K279 Peptic ulcer, site unspecified, unspecified as acute or chronic, without hemorrhage or perforation: Secondary | ICD-10-CM | POA: Diagnosis present

## 2012-09-08 DIAGNOSIS — J441 Chronic obstructive pulmonary disease with (acute) exacerbation: Principal | ICD-10-CM | POA: Diagnosis present

## 2012-09-08 DIAGNOSIS — Z87891 Personal history of nicotine dependence: Secondary | ICD-10-CM

## 2012-09-08 DIAGNOSIS — J209 Acute bronchitis, unspecified: Secondary | ICD-10-CM | POA: Diagnosis present

## 2012-09-08 DIAGNOSIS — E669 Obesity, unspecified: Secondary | ICD-10-CM

## 2012-09-08 DIAGNOSIS — J45901 Unspecified asthma with (acute) exacerbation: Secondary | ICD-10-CM

## 2012-09-08 DIAGNOSIS — G4730 Sleep apnea, unspecified: Secondary | ICD-10-CM | POA: Diagnosis present

## 2012-09-08 DIAGNOSIS — G5600 Carpal tunnel syndrome, unspecified upper limb: Secondary | ICD-10-CM | POA: Diagnosis present

## 2012-09-08 DIAGNOSIS — IMO0002 Reserved for concepts with insufficient information to code with codable children: Secondary | ICD-10-CM

## 2012-09-08 DIAGNOSIS — Z6839 Body mass index (BMI) 39.0-39.9, adult: Secondary | ICD-10-CM

## 2012-09-08 DIAGNOSIS — R0989 Other specified symptoms and signs involving the circulatory and respiratory systems: Secondary | ICD-10-CM | POA: Diagnosis present

## 2012-09-08 DIAGNOSIS — J069 Acute upper respiratory infection, unspecified: Secondary | ICD-10-CM | POA: Diagnosis present

## 2012-09-08 DIAGNOSIS — E876 Hypokalemia: Secondary | ICD-10-CM | POA: Diagnosis present

## 2012-09-08 HISTORY — DX: Carpal tunnel syndrome, unspecified upper limb: G56.00

## 2012-09-08 HISTORY — DX: Unspecified urinary incontinence: R32

## 2012-09-08 HISTORY — DX: Obstructive sleep apnea (adult) (pediatric): G47.33

## 2012-09-08 HISTORY — DX: Other specified urinary incontinence: N39.498

## 2012-09-08 HISTORY — DX: Angioneurotic edema, initial encounter: T78.3XXA

## 2012-09-08 HISTORY — DX: Dependence on other enabling machines and devices: Z99.89

## 2012-09-08 LAB — BASIC METABOLIC PANEL
CO2: 26 mEq/L (ref 19–32)
Calcium: 9.3 mg/dL (ref 8.4–10.5)
GFR calc non Af Amer: 62 mL/min — ABNORMAL LOW (ref >90)
Potassium: 3.4 mEq/L — ABNORMAL LOW (ref 3.5–5.1)
Sodium: 139 mEq/L (ref 135–145)

## 2012-09-08 LAB — POCT I-STAT 3, ART BLOOD GAS (G3+)
Bicarbonate: 27.8 mEq/L — ABNORMAL HIGH (ref 20.0–24.0)
pCO2 arterial: 42 mmHg (ref 35.0–45.0)
pH, Arterial: 7.429 (ref 7.350–7.450)
pO2, Arterial: 64 mmHg — ABNORMAL LOW (ref 80.0–100.0)

## 2012-09-08 LAB — HEPATIC FUNCTION PANEL
ALT: 11 U/L (ref 0–35)
AST: 14 U/L (ref 0–37)
Total Protein: 7.3 g/dL (ref 6.0–8.3)

## 2012-09-08 LAB — CBC
MCH: 27.4 pg (ref 26.0–34.0)
MCHC: 34.3 g/dL (ref 30.0–36.0)
Platelets: 262 10*3/uL (ref 150–400)
RBC: 4.96 MIL/uL (ref 3.87–5.11)

## 2012-09-08 LAB — DIFFERENTIAL
Eosinophils Relative: 4 % (ref 0–5)
Lymphocytes Relative: 21 % (ref 12–46)
Lymphs Abs: 2.9 10*3/uL (ref 0.7–4.0)
Monocytes Absolute: 1.4 10*3/uL — ABNORMAL HIGH (ref 0.1–1.0)
Neutro Abs: 9 10*3/uL — ABNORMAL HIGH (ref 1.7–7.7)

## 2012-09-08 LAB — MAGNESIUM: Magnesium: 2 mg/dL (ref 1.5–2.5)

## 2012-09-08 LAB — POCT I-STAT TROPONIN I: Troponin i, poc: 0.01 ng/mL (ref 0.00–0.08)

## 2012-09-08 MED ORDER — LEVOFLOXACIN IN D5W 750 MG/150ML IV SOLN
750.0000 mg | Freq: Once | INTRAVENOUS | Status: AC
  Administered 2012-09-08: 750 mg via INTRAVENOUS
  Filled 2012-09-08: qty 150

## 2012-09-08 MED ORDER — IPRATROPIUM BROMIDE 0.02 % IN SOLN: 0.5000 mg | RESPIRATORY_TRACT | Status: DC

## 2012-09-08 MED ORDER — IPRATROPIUM BROMIDE 0.02 % IN SOLN
1.5000 mg | Freq: Once | RESPIRATORY_TRACT | Status: AC
  Administered 2012-09-08: 1.5 mg via RESPIRATORY_TRACT

## 2012-09-08 MED ORDER — POTASSIUM CHLORIDE 20 MEQ/15ML (10%) PO LIQD
40.0000 meq | ORAL | Status: AC
  Administered 2012-09-08 – 2012-09-09 (×2): 40 meq via ORAL
  Filled 2012-09-08 (×2): qty 30

## 2012-09-08 MED ORDER — ALBUTEROL (5 MG/ML) CONTINUOUS INHALATION SOLN: 10.0000 mg/h | INHALATION_SOLUTION | Freq: Once | RESPIRATORY_TRACT | Status: AC

## 2012-09-08 MED ORDER — METHYLPREDNISOLONE SODIUM SUCC 125 MG IJ SOLR: 60.0000 mg | Freq: Four times a day (QID) | INTRAMUSCULAR | Status: DC

## 2012-09-08 MED ORDER — MORPHINE SULFATE 2 MG/ML IJ SOLN
2.0000 mg | Freq: Once | INTRAMUSCULAR | Status: AC
  Administered 2012-09-08: 2 mg via INTRAVENOUS
  Filled 2012-09-08: qty 1

## 2012-09-08 MED ORDER — IPRATROPIUM BROMIDE 0.02 % IN SOLN
0.5000 mg | RESPIRATORY_TRACT | Status: DC
  Filled 2012-09-08: qty 2.5
  Filled 2012-09-08: qty 5

## 2012-09-08 MED ORDER — ALBUTEROL SULFATE (5 MG/ML) 0.5% IN NEBU
2.5000 mg | INHALATION_SOLUTION | RESPIRATORY_TRACT | Status: DC
  Filled 2012-09-08: qty 0.5

## 2012-09-08 MED ORDER — ALBUTEROL SULFATE (5 MG/ML) 0.5% IN NEBU
5.0000 mg | INHALATION_SOLUTION | Freq: Once | RESPIRATORY_TRACT | Status: AC
  Administered 2012-09-08: 5 mg via RESPIRATORY_TRACT
  Filled 2012-09-08: qty 1

## 2012-09-08 MED ORDER — ALBUTEROL SULFATE (5 MG/ML) 0.5% IN NEBU: 2.5000 mg | INHALATION_SOLUTION | RESPIRATORY_TRACT | Status: DC

## 2012-09-08 MED ORDER — AZITHROMYCIN 250 MG PO TABS
500.0000 mg | ORAL_TABLET | Freq: Once | ORAL | Status: AC
  Administered 2012-09-08: 500 mg via ORAL
  Filled 2012-09-08: qty 2

## 2012-09-08 MED ORDER — ALBUTEROL (5 MG/ML) CONTINUOUS INHALATION SOLN
INHALATION_SOLUTION | RESPIRATORY_TRACT | Status: AC
  Administered 2012-09-08: 10 mg/h via RESPIRATORY_TRACT
  Filled 2012-09-08: qty 20

## 2012-09-08 MED ORDER — IPRATROPIUM BROMIDE 0.02 % IN SOLN
0.5000 mg | Freq: Once | RESPIRATORY_TRACT | Status: AC
  Administered 2012-09-08: 0.5 mg via RESPIRATORY_TRACT
  Filled 2012-09-08: qty 2.5

## 2012-09-08 MED ORDER — ALBUTEROL SULFATE (5 MG/ML) 0.5% IN NEBU
2.5000 mg | INHALATION_SOLUTION | Freq: Once | RESPIRATORY_TRACT | Status: AC
  Administered 2012-09-08: 2.5 mg via RESPIRATORY_TRACT

## 2012-09-08 MED ORDER — IPRATROPIUM BROMIDE 0.02 % IN SOLN: 1.5000 mg | RESPIRATORY_TRACT | Status: DC

## 2012-09-08 MED ORDER — METHYLPREDNISOLONE SODIUM SUCC 125 MG IJ SOLR
60.0000 mg | Freq: Two times a day (BID) | INTRAMUSCULAR | Status: DC
  Administered 2012-09-08 – 2012-09-09 (×3): 60 mg via INTRAVENOUS
  Filled 2012-09-08 (×2): qty 0.96
  Filled 2012-09-08: qty 2
  Filled 2012-09-08 (×2): qty 0.96

## 2012-09-08 MED ORDER — PREDNISONE 20 MG PO TABS
60.0000 mg | ORAL_TABLET | Freq: Once | ORAL | Status: AC
  Administered 2012-09-08: 60 mg via ORAL
  Filled 2012-09-08: qty 3

## 2012-09-08 NOTE — H&P (Signed)
Date: 09/08/2012               Patient Name:  Maria Nelson MRN: [id number]  DOB: July 16, 1955 Age / Sex: 57 y.o., female   PCP: Linward Headland, MD              Medical Service: Internal Medicine Teaching Service              Attending Physician: Dr. Dalphine Handing    First Contact: Dr. Sherrine Maples Pager: [phone]  Second Contact: Dr. Everardo Beals Pager: [phone]            After Hours (After 5p/  First Contact Pager: [phone]  weekends / holidays): Second Contact Pager: [phone]      Chief Complaint: shortness of breath and cough  History of Present Illness: Patient is a 57 y.o. female with a PMHx of non-oxygen dependent COPD and asthma (has not previously required intubation), PUD 2/2 H.pylori in 2008, obesity, OSA with CPAP noncompliance who presents to [facility] for evaluation of shortness of breath and cough x 5 days. Pt indicates that at baseline, she has significant DOE to the point that she limits her activities with even minimal exertion (< 1 block). However, since 5 days PTA, she has had progressively worsening shortness of breath atop her chronic symptoms. This SOB has been refractory to scheduled albuterol q4h (which is increased from her typical TID usage) in addition to BID albuterol/ Atrovent nebulizers. She has additionally experienced productive cough with thick yellow sputum, decreased appetite, chills, subjective fevers, sore throat.  On initial presentation, it seems the pt had severe respiratory distress, diffuse wheezing, and decreased breath sounds. She additionally was desaturating to 85 % on room air and only able to speak in short sentences with mild use of accessory muscles. She was provided 1 hour continuous albuterol treatment, oral prednisone, and IV Levaquin with some subjective improvement of symptoms and more ease of breath.   Review of Systems: Constitutional:  admits to chills, possible subjective fever, chills, decreased appetite and fatigue.  HEENT: admits to nasal  congestion, sore throat. Denies eye pain, hearing loss, ear pain, ear discharge.  Respiratory: admits to SOB, acute on chronic DOE, increased WOB, productive cough, chest tightness. Denies wheezing.  Cardiovascular: admits to chest wall, pain after prolonged cough, palpitations. Denies leg swelling, palpitations.  Gastrointestinal: admits to nausea, post-tussive emesis. Denies abdominal pain, diarrhea, constipation, blood in stool.  Genitourinary: denies dysuria, urgency, frequency, hematuria, flank pain.  Musculoskeletal: admits to chronic back pain unchanged from baseline, myalgias. Denies joint swelling, arthralgias and gait problem.   Skin: denies rash and wound.  Neurological: admits to generalized weakness, lightheadedness. Denies dizziness, seizures, syncope, numbness and headaches.   Hematological: denies easy bruising, personal or family bleeding history.  Psychiatric: denies suicidal ideation, mood changes, sleep disturbance and agitation.    Current Medications: ED Administered Medications: Medication Dose Route Frequency  . methylPREDNISolone sodium succinate (SOLU-MEDROL) 125 mg/2 mL injection 60 mg  60 mg Intravenous Q12H  . potassium chloride 20 MEQ/15ML (10%) liquid 40 mEq  40 mEq Oral Q4H    Current Outpatient Prescriptions: Medication Sig  . albuterol (PROVENTIL HFA;VENTOLIN HFA) 108 (90 BASE) MCG/ACT inhaler Inhale 2 puffs into the lungs every 6 (six) hours as needed for wheezing.  [name] albuterol (PROVENTIL) (2.5 MG/3ML) 0.083% nebulizer solution Take 3 mLs (2.5 mg total) by nebulization every 6 (six) hours as needed for wheezing.  . desloratadine (CLARINEX) 5 MG tablet Take 5 mg by mouth daily.  [name]  EPINEPHrine (EPIPEN) 0.3 mg/0.3 mL DEVI Inject 0.3 mg into the muscle once as needed (allergic reaction).  . Fluticasone-Salmeterol (ADVAIR) 500-50 MCG/DOSE AEPB Inhale 1 puff into the lungs 2 (two) times daily.  [name] ipratropium (ATROVENT) 0.02 % nebulizer solution Take 2.5  mLs (500 mcg total) by nebulization 4 (four) times daily as needed (shortness of breath.).  [name] pantoprazole (PROTONIX) 40 MG tablet Take 1 tablet (40 mg total) by mouth daily.  . predniSONE (DELTASONE) 10 MG tablet Take 5 mg by mouth daily.  . traMADol (ULTRAM) 50 MG tablet Take 2 tablets (100 mg total) by mouth every 8 (eight) hours as needed for pain.  . traZODone (DESYREL) 25 mg TABS Take 25 mg by mouth every other day.     Allergies: Allergies  Allergen Reactions  . Bee Venom Anaphylaxis  . Aspirin     REACTION: nausea     Past Medical History: Past Medical History  Diagnosis Date  . COPD (chronic obstructive pulmonary disease)     secondary to tobacco use // No PFTs on file  . Gastrointestinal hemorrhage     secondary to PUD on March 2008  . Peptic ulcer disease     +H. pylori (antigen)- Dr. Juanda Chance- treated  . ETOH abuse     Pt stopped in 3/08  . Tobacco abuse     quit in 2010  . OSA on CPAP     noncompliant with CPAP (2/2 it being depressing)  . Urinary incontinence   . Carpal tunnel syndrome 06/23/2011    Patient notes history of bilateral carpal tunnel syndrome.  Now has symptoms of right hand carpal tunnel.   . ANGIOEDEMA 12/16/2009    Qualifier: Diagnosis of  By: Denton Meek MD, Tillie Rung    . STRESS INCONTINENCE 01/24/2007    Qualifier: Diagnosis of  By: Polly Cobia MD, Adwait      Past Surgical History: Past Surgical History  Procedure Laterality Date  . Tubal ligation    . Mandible surgery      due to trauma    Family History: Family History  Problem Relation Age of Onset  . Diabetes Sister   . Breast cancer Sister 105    died 2/2 breast ca age 32-60  . Diabetes Mother   . Coronary artery disease Mother 22    requiring 5 vessel CABG    Social History: History   Social History  . Marital Status: Single    Spouse Name: N/A    Number of Children: 3  . Years of Education: 12th grade   Occupational History  . Disability     previously worked in [employer]  had to stop 2/2 occupational exposures leading to exacerbations of asthma    Social History Main Topics  . Smoking status: Former Smoker -- 0.40 packs/day    Types: Cigarettes    Quit date: 12/04/2010  . Smokeless tobacco: Not on file     Comment: stopped 3 weeks ago  . Alcohol Use: Yes     Comment: now drinking a beer on weekends //   . Drug Use: No  . Sexually Active: Not on file   Other Topics Concern  . Not on file   Social History Narrative   Pt is not working now, was previously a Conservation officer, nature at [employer].   Pt lives with her mother      Pt has received financial assistance approval for 100% discount at [facility] and has [facility] card.  Vital Signs: Blood pressure 140/85, pulse 91, temperature 98.8 F (37.1 C), temperature source Oral, resp. rate 24, SpO2 95.00%.  Physical Exam: General: Vital signs reviewed and noted. Obese woman that is in mild distress due to cough. Able to speak in full sentences without difficulty. No accessory muscle usage. She is alert and cooperative throughout examination.  Head: Normocephalic, atraumatic.  Eyes: PERRL, No signs of anemia or jaundince.  Nose: Mucous membranes moist, inflammed, nonerythematous.  Throat: Oropharynx mildly erythematous, no exudate appreciated.   Neck: No deformities, masses, or tenderness noted. Supple, No carotid Bruits, no JVD.  Lungs:  Normal respiratory effort. Diffuse limitation of air movement with decreased breath sounds. Occasional expiratory wheeze   Heart: RRR. S1 and S2 normal without gallop, murmur, or rubs.  Abdomen:  BS normoactive. Soft, Nondistended, non-tender.  No masses or organomegaly.  Extremities: No pretibial edema.  Neurologic: A&O X3, CN II - XII are grossly intact. Motor strength is 5/5 in the all 4 extremities, Sensations intact to light touch, Cerebellar signs negative.  Skin: No visible rashes, scars.    Lab results:  CURRENT LABS: CBC:    Component Value Date/Time    WBC 14.0* 09/08/2012 1741   HGB 13.6 09/08/2012 1741   HCT 39.7 09/08/2012 1741   PLT 262 09/08/2012 1741   MCV 80.0 09/08/2012 1741   NEUTROABS 9.0* 09/08/2012 1741   LYMPHSABS 2.9 09/08/2012 1741   MONOABS 1.4* 09/08/2012 1741   EOSABS 0.5 09/08/2012 1741   BASOSABS 0.1 09/08/2012 1741     Metabolic Panel:    Component Value Date/Time   NA 139 09/08/2012 1741   K 3.4* 09/08/2012 1741   CL 99 09/08/2012 1741   CO2 26 09/08/2012 1741   BUN 11 09/08/2012 1741   CREATININE 0.99 09/08/2012 1741   GLUCOSE 115* 09/08/2012 1741   CALCIUM 9.3 09/08/2012 1741   AST 14 09/08/2012 1741   ALT 11 09/08/2012 1741   ALKPHOS 102 09/08/2012 1741   BILITOT 0.2* 09/08/2012 1741   PROT 7.3 09/08/2012 1741   ALBUMIN 3.3* 09/08/2012 1741     Troponin (Point of Care Test)  Recent Labs  09/08/12 1749  TROPIPOC 0.01    ABG:     Component Value Date/Time   PHART 7.429 09/08/2012 2059   PCO2ART 42.0 09/08/2012 2059   PO2ART 64.0* 09/08/2012 2059   HCO3 27.8* 09/08/2012 2059   TCO2 29 09/08/2012 2059   ACIDBASEDEF 2.0 01/21/2008 1737   O2SAT 92.0 09/08/2012 2059    HISTORICAL LABS: Lab Results  Component Value Date   HGBA1C 5.6 12/12/2010    Lab Results  Component Value Date   CHOL 192 06/09/2009   HDL 70 06/09/2009   LDLCALC 107* 06/09/2009   TRIG 73 06/09/2009   CHOLHDL 2.7 Ratio 06/09/2009    Lab Results  Component Value Date   TSH 0.629 12/10/2010     Imaging results:   Dg Chest 2 View (if Patient Has Fever And/or Copd) (09/08/2012) - No active cardiopulmonary disease.  Changes related to COPD are noted.   Original Report Authenticated By: Jolaine Click, M.D.    Other results:  EKG (09/08/2012) - Sinus Tachycardia with approximate rate of 110 bpm, normal axis, ST segments: normal.    Assessment & Plan:  Pt is a 57 y.o. yo female with a PMHx of non-oxygen dependent COPD and asthma (has not previously required intubation), PUD 2/2 H.pylori in 2008, obesity, OSA with CPAP noncompliance who was  admitted on 09/08/2012 with symptoms  of progressively worsening SOB/cough, which is acute COPD exacerbation with asthma likely 2/2 acute bronchitis. Interventions at this time will be focused on stabilization of respiratory symptoms.     Principal Problem:   Acute exacerbation of COPD with asthma secondary to acute bronchitis Active Problems:   Sinus tachycardia   Hypokalemia   Dyspnea on exertion   Peptic ulcer disease   Sleep apnea   Carpal tunnel syndrome   Obesity (BMI 30-39.9)   1) Acute exacerbation of COPD with asthma secondary to acute bronchitis - pt indicates progressive SOB/ cough x 5d PTA that has been refractory to scheduled inhaler therapy at home q4h and oral prednisone (unclear if this was left over from prior admission as pt is NOT supposed to be on prednisone at baseline). On initial presentation, she was in acute distress and with increased work of breathing with hypoxia of 85% on room air. She had reduced air entry with wheezing mainly on the right lung field. Chest x ray is normal. Has a mild leukocytosis of 14. Given severity of presenting symptoms, that have mildly improved with reevaluation, will initially monitor her in a higher acuity unit overnight, likely to be transitioned to the floor tomorrow.  Plan: - Admit to SDU - Solumedrol 60mg  IV BID - Scheduled Xoponex / Atrovent q6h  - PRN Albuterol/ Atrovent q4h - Continuous pulse oximetry  - Oxygen per Strong with goal O2 sat > 92% - Doxycycline 100 mg bid. Received on dose of IV levaquin  2) Sinus tachycardia - likely 2/2 excessive albuterol usage and acute illness. EKG otherwise unremarkable.  - Substitute xoponex scheduled. - Will check TSH (given chronic SOB, DOE, fatigue)  3) Hypokalemia - admission K 3.3, again likley in setting of excessive albuterol usage, possibly also contributed by her prednisone therapy and decreased oral intake. - Check Mg - Replete K liquid x 1   4) Dyspnea on exertion - pt  indicates 2-3 year hx of severe progressive DOE that led to severe limitation in functional capacity. She is essentially not leaving her home as a result. The immediate cause of this is not apparent. Differential diagnoses considered include uncontrolled baseline COPD / asthma that may require pulmonary rehabilitation versus further optimization of medical therapy. To evaluate this, outpt PFTs after acute respiratory issues resolve, may be helpful. Additionally, she has hx of uncontrolled OSA (intolerant to CPAP), which may chronically have led to pulmonary hypertension contributing toward symptoms. Immediately, given the pt's family history of early MI (M had MI at 57yo approximately), obesity, severe DOE - we must evaluate for ischemic contributers. Regardless of if inpt evaluation is unremarkable - may benefit at least from stress test as an outpt. Plan - Cycle cardiac enzymes x 3. - Check TSH, lipid panel. - 2-D echocardiogram to evaluate from structural heart disease, pulmonary pressures. - Will attempt trial of CPAP during hospital course. - Likely needs outpt PFTs after resolution of acute pulmonary issues.  - May also consider outpt cardiac evaluation pending results of inpt evaluation.  5) OSA - recommended CPAP, however, she remains noncompliant. - Will trial CPAP utilization during hospital course.  6) Bilateral carpal tunnel syndrome - continue splinting. PRN tramadol for this and her chronic back pain (with respiratory hold parameters)    DVT PPX - low molecular weight heparin  CODE STATUS - Full  CONSULTS PLACED - None  DISPO - Disposition is deferred at this time, awaiting improvement of respiratory symptoms.   Anticipated discharge in approximately 2-3  day(s).   The patient does have a current PCP Linward Headland, MD) and does need an [facility] hospital follow-up appointment after discharge.    Is the [facility] hospital follow-up appointment a one-time only appointment? no.  Does the  patient have transportation limitations that hinder transportation to clinic appointments? unknown.   Signed: Dow Adolph PGY-1, Internal Medicine Resident Pager: [phone] (7PM-7AM) 09/08/2012, 11:12 PM

## 2012-09-08 NOTE — ED Provider Notes (Signed)
57 year old female who presents with severe respiratory distress, diffuse wheezing and decreased breath sounds. After continuous nebulizer therapy the patient has had some improvement in her oxygen saturations, remains tachycardic to 110 beats per minute, able to speak in short sentences but is using mild accessory muscles. Her chest x-ray does not show any acute infiltrates but she does have a white blood cell count of 14,000, this is consistent with a possible infectious etiology sparking reactive airway disease worsening. The patient lower choir admission to the hospital.  I supervised the resident, I personally evaluated the patient, I agree with his medical decision-making and treatment plan with disposition.  Vida Roller, MD 09/08/12 2015

## 2012-09-08 NOTE — ED Notes (Signed)
Pt placed on bed pan unable to void

## 2012-09-08 NOTE — ED Provider Notes (Signed)
History     CSN: [id number]  Arrival date & time 09/08/12  1721   First MD Initiated Contact with Patient 09/08/12 1753      Chief Complaint  Patient presents with  . Cough  . Shortness of Breath    (Consider location/radiation/quality/duration/timing/severity/associated sxs/prior treatment) HPI Comments: 57 y.o. female w/ pmh of COPD (no oxygen requirement). Over the past few days, started out as cold per patient, and then has gotten worse. Has been using home neb tx without improvement in symptoms. With the amount of coughing she is doing it's now causing chest wall pain. Chest wall pain only present when she coughs. Sputum production -- yellow. No fevers. She has had chills. No n/v. Has been wheezing during this time. Pt's PCP is Dr. Manson Passey in outpatient clinics.   Patient is a 57 y.o. female presenting with shortness of breath and general illness. The history is provided by the patient.  Shortness of Breath Severity:  Mild Onset quality:  Gradual Duration:  5 days Timing:  Constant Progression:  Worsening Chronicity:  Recurrent Relieved by:  Inhaler Associated symptoms: cough and wheezing   Associated symptoms: no abdominal pain, no chest pain, no fever, no headaches, no neck pain, no rash and no vomiting   Illness Severity:  Mild Onset quality:  Gradual Timing:  Constant Progression:  Unchanged Chronicity:  New Associated symptoms: cough, shortness of breath and wheezing   Associated symptoms: no abdominal pain, no chest pain, no diarrhea, no fatigue, no fever, no headaches, no rash and no vomiting     Past Medical History  Diagnosis Date  . COPD (chronic obstructive pulmonary disease)     secondary to tobacco use  . Gastrointestinal hemorrhage     secondary to PUD on March 2008  . Peptic ulcer disease     +H. pylori (antigen)- Dr. Juanda Chance- treated  . ETOH abuse     Pt stopped in 3/08  . Tobacco abuse     Pt attempting to stop 8/12   . Sleep apnea     Past  Surgical History  Procedure Laterality Date  . Tubal ligation    . Mandible surgery      due to trauma    Family History  Problem Relation Age of Onset  . COPD Sister   . Breast cancer Sister   . Diabetes Mother   . Coronary artery disease Mother     History  Substance Use Topics  . Smoking status: Former Smoker -- 0.40 packs/day    Types: Cigarettes    Quit date: 12/04/2010  . Smokeless tobacco: Not on file     Comment: stopped 3 weeks ago  . Alcohol Use: Yes     Comment: 2 beers    OB History   Grav Para Term Preterm Abortions TAB SAB Ect Mult Living                  Review of Systems  Constitutional: Positive for chills. Negative for fever and fatigue.  HENT: Negative for facial swelling, drooling, neck pain and dental problem.   Eyes: Negative for pain, discharge and itching.  Respiratory: Positive for cough, shortness of breath and wheezing. Negative for choking and stridor.   Cardiovascular: Negative for chest pain.  Gastrointestinal: Negative for vomiting, abdominal pain and diarrhea.  Endocrine: Negative for cold intolerance and heat intolerance.  Genitourinary: Negative for vaginal discharge, difficulty urinating and vaginal pain.  Skin: Negative for pallor and rash.  Neurological: Positive for weakness.  Negative for dizziness, light-headedness and headaches.  Psychiatric/Behavioral: Negative for behavioral problems and agitation.    Allergies  Bee venom and Aspirin  Home Medications   Current Outpatient Rx  Name  Route  Sig  Dispense  Refill  . EXPIRED: albuterol (PROVENTIL HFA;VENTOLIN HFA) 108 (90 BASE) MCG/ACT inhaler   Inhalation   Inhale 2 puffs into the lungs every 4 (four) hours as needed for shortness of breath.          [name] albuterol (PROVENTIL) (2.5 MG/3ML) 0.083% nebulizer solution   Nebulization   Take 3 mLs (2.5 mg total) by nebulization every 6 (six) hours as needed for wheezing.   75 mL   12   . EXPIRED: desloratadine (CLARINEX)  5 MG tablet   Oral   Take 5 mg by mouth daily.         . diclofenac sodium (VOLTAREN) 1 % GEL   Topical   Apply 2 g topically 4 (four) times daily. To left wrist   1 Tube   2   . EPINEPHrine (EPIPEN) 0.3 mg/0.3 mL DEVI   Intramuscular   Inject 0.3 mg into the muscle once as needed (allergic reaction).         . Fluticasone-Salmeterol (ADVAIR) 500-50 MCG/DOSE AEPB   Inhalation   Inhale 1 puff into the lungs 2 (two) times daily.   60 each   11   . ipratropium (ATROVENT) 0.02 % nebulizer solution   Nebulization   Take 2.5 mLs (500 mcg total) by nebulization 4 (four) times daily as needed (shortness of breath.).   75 mL   12   . pantoprazole (PROTONIX) 40 MG tablet   Oral   Take 1 tablet (40 mg total) by mouth daily.   30 tablet   11   . traMADol (ULTRAM) 50 MG tablet   Oral   Take 2 tablets (100 mg total) by mouth every 8 (eight) hours as needed for pain.   30 tablet   0   . traZODone (DESYREL) 25 mg TABS   Oral   Take 25 mg by mouth every other day.            BP 145/90  Pulse 109  Temp(Src) 98.8 F (37.1 C) (Oral)  Resp 18  SpO2 92%  Physical Exam  Constitutional: She is oriented to person, place, and time. She appears well-developed. No distress.  HENT:  Head: Normocephalic and atraumatic.  Eyes: Pupils are equal, round, and reactive to light. Right eye exhibits no discharge. Left eye exhibits no discharge.  Neck: Neck supple. No tracheal deviation present.  Cardiovascular: Normal rate.  Exam reveals no gallop and no friction rub.   Pulmonary/Chest: No stridor. She is in respiratory distress. She has wheezes.  Increased RR, moving poor air  Abdominal: Soft. She exhibits no distension. There is no tenderness. There is no rebound.  Musculoskeletal: She exhibits no edema and no tenderness.  Neurological: She is alert and oriented to person, place, and time.  Skin: Skin is warm. She is not diaphoretic.    ED Course  Procedures (including critical  care time)  Labs Reviewed  CBC - Abnormal; Notable for the following:    WBC 14.0 (*)    All other components within normal limits  BASIC METABOLIC PANEL - Abnormal; Notable for the following:    Potassium 3.4 (*)    Glucose, Bld 115 (*)    GFR calc non Af Amer 62 (*)    GFR calc Af Denyse Dago  72 (*)    All other components within normal limits  HEPATIC FUNCTION PANEL - Abnormal; Notable for the following:    Albumin 3.3 (*)    Total Bilirubin 0.2 (*)    All other components within normal limits  DIFFERENTIAL - Abnormal; Notable for the following:    Neutro Abs 9.0 (*)    Monocytes Absolute 1.4 (*)    All other components within normal limits  POCT I-STAT 3, BLOOD GAS (G3+) - Abnormal; Notable for the following:    pO2, Arterial 64.0 (*)    Bicarbonate 27.8 (*)    Acid-Base Excess 3.0 (*)    All other components within normal limits  MAGNESIUM  TROPONIN I  POCT I-STAT TROPONIN I   Dg Chest 2 View (if Patient Has Fever And/or Copd)  09/08/2012   *RADIOLOGY REPORT*  Clinical Data: Cough.  Short of breath.  CHEST - 2 VIEW  Comparison: 09/03/2011  Findings: Hyperaeration.  Bronchitic changes.  Chronic interstitial changes.  No new consolidation or mass.  No pleural effusion.  No definite Kerley B lines to suggest edema.  Upper normal heart size.  IMPRESSION: No active cardiopulmonary disease.  Changes related to COPD are noted.   Original Report Authenticated By: Jolaine Click, M.D.    ECG shows HR 108, sinus tachycardia. Compared to ECG from may 17th, 2013, and no significant changes are found (prior ECG did not show tachycardia).   MDM  Suspect COPD exacerbation, will eval for pneumonia. Will give steroids, breathing tx -- pt is moving poor air on exam, one hour continuous is given. Doubt ACS, pt does not have chest pain without coughing, ECG does not show acute pathology, pt feels like she is having chest wall pain.   Pt has moderate improvement with one hour continuous.   Pt's chest  x-ray does not show pneumonia. Will give azithro for copd exacerbation. Blood gas does not show hypercarbic respiratory failure.   Pt is admitted to medicine team for further evaluation and care.   1. Respiratory Distress 2. COPD exacerbation.       Bernadene Person, MD 09/08/12 [id number]  Bernadene Person, MD 09/14/12 [phone]

## 2012-09-08 NOTE — Progress Notes (Signed)
CAT stopped.

## 2012-09-08 NOTE — ED Notes (Signed)
Reports having productive cough x 1 week with yellow sputum production. Voice is hoarse. Airway intact.

## 2012-09-09 ENCOUNTER — Encounter (HOSPITAL_COMMUNITY): Payer: Self-pay | Admitting: Internal Medicine

## 2012-09-09 DIAGNOSIS — E669 Obesity, unspecified: Secondary | ICD-10-CM

## 2012-09-09 DIAGNOSIS — E876 Hypokalemia: Secondary | ICD-10-CM

## 2012-09-09 DIAGNOSIS — K219 Gastro-esophageal reflux disease without esophagitis: Secondary | ICD-10-CM

## 2012-09-09 DIAGNOSIS — J441 Chronic obstructive pulmonary disease with (acute) exacerbation: Secondary | ICD-10-CM

## 2012-09-09 LAB — EXPECTORATED SPUTUM ASSESSMENT W GRAM STAIN, RFLX TO RESP C

## 2012-09-09 LAB — TSH: TSH: 0.347 u[IU]/mL — ABNORMAL LOW (ref 0.350–4.500)

## 2012-09-09 LAB — TROPONIN I: Troponin I: <0.3 ng/mL (ref <0.30)

## 2012-09-09 LAB — MRSA PCR SCREENING: MRSA by PCR: NEGATIVE

## 2012-09-09 LAB — LIPID PANEL: LDL Cholesterol: 93 mg/dL (ref 0–99)

## 2012-09-09 MED ORDER — ACETAMINOPHEN 650 MG RE SUPP: 650.0000 mg | Freq: Four times a day (QID) | RECTAL | Status: DC | PRN

## 2012-09-09 MED ORDER — SODIUM CHLORIDE 0.9 % IJ SOLN
3.0000 mL | Freq: Two times a day (BID) | INTRAMUSCULAR | Status: DC
  Administered 2012-09-09 – 2012-09-11 (×5): 3 mL via INTRAVENOUS

## 2012-09-09 MED ORDER — TRAZODONE 25 MG HALF TABLET
25.0000 mg | ORAL_TABLET | ORAL | Status: DC
  Administered 2012-09-09 – 2012-09-11 (×2): 25 mg via ORAL
  Filled 2012-09-09 (×2): qty 1

## 2012-09-09 MED ORDER — LEVALBUTEROL HCL 0.63 MG/3ML IN NEBU
0.6300 mg | INHALATION_SOLUTION | Freq: Four times a day (QID) | RESPIRATORY_TRACT | Status: DC
  Administered 2012-09-09 (×2): 0.63 mg via RESPIRATORY_TRACT
  Filled 2012-09-09 (×6): qty 3

## 2012-09-09 MED ORDER — ALBUTEROL SULFATE (5 MG/ML) 0.5% IN NEBU
2.5000 mg | INHALATION_SOLUTION | RESPIRATORY_TRACT | Status: DC | PRN
Start: 2012-09-09 — End: 2012-09-11

## 2012-09-09 MED ORDER — IPRATROPIUM BROMIDE 0.02 % IN SOLN: 1.5000 mg | Freq: Four times a day (QID) | RESPIRATORY_TRACT | Status: DC

## 2012-09-09 MED ORDER — IPRATROPIUM BROMIDE HFA 17 MCG/ACT IN AERS
2.0000 | INHALATION_SPRAY | RESPIRATORY_TRACT | Status: DC | PRN
  Filled 2012-09-09: qty 12.9

## 2012-09-09 MED ORDER — ACETAMINOPHEN 325 MG PO TABS: 650.0000 mg | ORAL_TABLET | Freq: Four times a day (QID) | ORAL | Status: DC | PRN

## 2012-09-09 MED ORDER — PANTOPRAZOLE SODIUM 40 MG PO TBEC
40.0000 mg | DELAYED_RELEASE_TABLET | Freq: Every day | ORAL | Status: DC
  Administered 2012-09-09 – 2012-09-11 (×3): 40 mg via ORAL
  Filled 2012-09-09 (×3): qty 1

## 2012-09-09 MED ORDER — GUAIFENESIN ER 600 MG PO TB12
600.0000 mg | ORAL_TABLET | Freq: Two times a day (BID) | ORAL | Status: DC
  Administered 2012-09-09 – 2012-09-11 (×6): 600 mg via ORAL
  Filled 2012-09-09 (×7): qty 1

## 2012-09-09 MED ORDER — TRAMADOL HCL 50 MG PO TABS
100.0000 mg | ORAL_TABLET | Freq: Three times a day (TID) | ORAL | Status: DC | PRN
  Administered 2012-09-11: 100 mg via ORAL
  Filled 2012-09-09: qty 2

## 2012-09-09 MED ORDER — ONDANSETRON HCL 4 MG PO TABS: 4.0000 mg | ORAL_TABLET | Freq: Four times a day (QID) | ORAL | Status: DC | PRN

## 2012-09-09 MED ORDER — ALBUTEROL SULFATE HFA 108 (90 BASE) MCG/ACT IN AERS
2.0000 | INHALATION_SPRAY | RESPIRATORY_TRACT | Status: DC | PRN
  Filled 2012-09-09: qty 6.7

## 2012-09-09 MED ORDER — DOXYCYCLINE HYCLATE 100 MG PO TABS
100.0000 mg | ORAL_TABLET | Freq: Two times a day (BID) | ORAL | Status: DC
  Administered 2012-09-09 – 2012-09-11 (×5): 100 mg via ORAL
  Filled 2012-09-09 (×6): qty 1

## 2012-09-09 MED ORDER — IPRATROPIUM BROMIDE 0.02 % IN SOLN
0.5000 mg | Freq: Three times a day (TID) | RESPIRATORY_TRACT | Status: DC
  Administered 2012-09-09 – 2012-09-11 (×7): 0.5 mg via RESPIRATORY_TRACT
  Filled 2012-09-09 (×6): qty 2.5

## 2012-09-09 MED ORDER — ALBUTEROL SULFATE (5 MG/ML) 0.5% IN NEBU
2.5000 mg | INHALATION_SOLUTION | Freq: Three times a day (TID) | RESPIRATORY_TRACT | Status: DC
  Administered 2012-09-09 – 2012-09-11 (×7): 2.5 mg via RESPIRATORY_TRACT
  Filled 2012-09-09 (×6): qty 0.5

## 2012-09-09 MED ORDER — SODIUM CHLORIDE 0.9 % IV SOLN
INTRAVENOUS | Status: DC
  Administered 2012-09-09: 01:00:00 via INTRAVENOUS

## 2012-09-09 MED ORDER — IPRATROPIUM BROMIDE 0.02 % IN SOLN
1.5000 mg | Freq: Four times a day (QID) | RESPIRATORY_TRACT | Status: DC
  Administered 2012-09-09: 1.5 mg via RESPIRATORY_TRACT
  Administered 2012-09-09: 0.5 mg via RESPIRATORY_TRACT
  Filled 2012-09-09 (×2): qty 2.5

## 2012-09-09 MED ORDER — ONDANSETRON HCL 4 MG/2ML IJ SOLN: 4.0000 mg | Freq: Four times a day (QID) | INTRAMUSCULAR | Status: DC | PRN

## 2012-09-09 MED ORDER — ENOXAPARIN SODIUM 40 MG/0.4ML ~~LOC~~ SOLN
40.0000 mg | SUBCUTANEOUS | Status: DC
  Administered 2012-09-09 – 2012-09-11 (×3): 40 mg via SUBCUTANEOUS
  Filled 2012-09-09 (×4): qty 0.4

## 2012-09-09 NOTE — Progress Notes (Signed)
Subjective: Pt states that she is doing much better since admission. She feels like her breathing is better. Still pain with cough but sputum no longer green, now white per pt.  Objective: Vital signs in last 24 hours: Filed Vitals:   09/09/12 0730 09/09/12 0740 09/09/12 0800 09/09/12 0954  BP:  116/76    Pulse: 60 80    Temp:   98.3 F (36.8 C)   TempSrc:   Oral   Resp: 14 20    Height:      Weight:      SpO2: 97% 96%  93%   Weight change:   Intake/Output Summary (Last 24 hours) at 09/09/12 1112 Last data filed at 09/09/12 0900  Gross per 24 hour  Intake   1020 ml  Output   1050 ml  Net    -30 ml   Vitals reviewed. General: Comfortably itting up in bed, NAD HEENT: PERRL, EOMI, no scleral icterus Cardiac: RRR, no rubs, murmurs or gallops Pulm: Diminished breath sounds, but sounds mostly clear with mild inspiratory wheezes in LLL. Abd: Soft, nontender, nondistended, BS present Ext: Warm and well perfused, no pedal edema Neuro: Alert and oriented X3, non-focal   Lab Results: Basic Metabolic Panel:  Recent Labs Lab 09/08/12 1741  NA 139  K 3.4*  CL 99  CO2 26  GLUCOSE 115*  BUN 11  CREATININE 0.99  CALCIUM 9.3  MG 2.0   Liver Function Tests:  Recent Labs Lab 09/08/12 1741  AST 14  ALT 11  ALKPHOS 102  BILITOT 0.2*  PROT 7.3  ALBUMIN 3.3*   CBC:  Recent Labs Lab 09/08/12 1741  WBC 14.0*  NEUTROABS 9.0*  HGB 13.6  HCT 39.7  MCV 80.0  PLT 262   Cardiac Enzymes:  Recent Labs Lab 09/08/12 2243 09/09/12 0540  TROPONINI <0.30 <0.30   Fasting Lipid Panel:  Recent Labs Lab 09/09/12 0540  CHOL 185  HDL 81  LDLCALC 93  TRIG 55  CHOLHDL 2.3   Urine Drug Screen: Drugs of Abuse     Component Value Date/Time   LABOPIA NONE DETECTED 12/11/2010 0651   COCAINSCRNUR NONE DETECTED 12/11/2010 0651   COCAINSCRNUR NEG 12/16/2009 2110   LABBENZ NONE DETECTED 12/11/2010 0651   LABBENZ NEG 12/16/2009 2110   AMPHETMU NONE DETECTED 12/11/2010 0651   AMPHETMU NEG 12/16/2009 2110   THCU NONE DETECTED 12/11/2010 0651   LABBARB NONE DETECTED 12/11/2010 1610     Micro Results: Recent Results (from the past 240 hour(s))  MRSA PCR SCREENING     Status: None   Collection Time    09/09/12 12:40 AM      Result Value Range Status   MRSA by PCR NEGATIVE  NEGATIVE Final   Comment:            The GeneXpert MRSA Assay (FDA     approved for NASAL specimens     only), is one component of a     comprehensive MRSA colonization     surveillance program. It is not     intended to diagnose MRSA     infection nor to guide or     monitor treatment for     MRSA infections.  CULTURE, EXPECTORATED SPUTUM-ASSESSMENT     Status: None   Collection Time    09/09/12  1:40 AM      Result Value Range Status   Specimen Description SPUTUM   Final   Special Requests NONE   Final   Sputum  evaluation     Final   Value: THIS SPECIMEN IS ACCEPTABLE. RESPIRATORY CULTURE REPORT TO FOLLOW.   Report Status 09/09/2012 FINAL   Final   Studies/Results: Dg Chest 2 View (if Patient Has Fever And/or Copd)  09/08/2012   *RADIOLOGY REPORT*  Clinical Data: Cough.  Short of breath.  CHEST - 2 VIEW  Comparison: 09/03/2011  Findings: Hyperaeration.  Bronchitic changes.  Chronic interstitial changes.  No new consolidation or mass.  No pleural effusion.  No definite Kerley B lines to suggest edema.  Upper normal heart size.  IMPRESSION: No active cardiopulmonary disease.  Changes related to COPD are noted.   Original Report Authenticated By: Jolaine Click, M.D.   Medications: I have reviewed the patient's current medications. Scheduled Meds: . albuterol  2.5 mg Nebulization TID  . doxycycline  100 mg Oral Q12H  . enoxaparin (LOVENOX) injection  40 mg Subcutaneous Q24H  . guaiFENesin  600 mg Oral BID  . ipratropium  0.5 mg Nebulization TID  . methylPREDNISolone (SOLU-MEDROL) injection  60 mg Intravenous Q12H  . pantoprazole  40 mg Oral Daily  . sodium chloride  3 mL Intravenous  Q12H  . traZODone  25 mg Oral QODAY   Continuous Infusions:  PRN Meds:.acetaminophen, acetaminophen, albuterol, albuterol, ondansetron (ZOFRAN) IV, ondansetron, traMADol  Assessment/Plan: Pt is a 57 y.o. yo female with a PMHx of non-oxygen dependent COPD and asthma (w/ no previous intubations), PUD 2/2 H.pylori in 2008, obesity, OSA with CPAP noncompliance who was admitted on 09/08/2012 with an acute COPD exacerbation with asthma likely 2/2 viral URI.   Acute exacerbation of COPD with asthma secondary viral URI: Pt with progressive SOB/DOE/cough x 5d prior to admission, refractory to q4h inhaler therapy at home and oral prednisone. On initial presentation, she was in acute distress and with increased work of breathing and hypoxic to 85% on room air. CXR was normal, but she did have a a mild leukocytosis to14. Levaquin was given in the ED but was changed to Doxy, and IV solumedrol was started for an acute COPD exacerbation. Today she is doing much better with improved breath sounds and sputum that is clearing. She was admitted to SDU overnight but since she is doing so well today, she is being transferred to the floor. Due to her DOE on admission, checking 2D ECHO to r/o CHF. - Doxycycline 100 mg bid. - Solumedrol 60mg  IV BID  - Scheduled Xoponex / Atrovent q6h  - PRN Albuterol/ Atrovent q4h  - Continuous pulse oximetry  - Oxygen per [place] with goal O2 sat > 92%  - 2D ECHO - Outpt PFTs 4-6 weeks from acte exacerbation  Hypokalemia: On admission K 3.3, likely in setting of excessive albuterol usage, possibly also contributed by her prednisone therapy and decreased oral intake. Mg normal. She was given po KCl. - AM BMP   OSA: CPAP recommended, but pt refusing.   Chronic pain: H/o bilateral carpal tunnel syndrome and chroninc back pain. Continue wrist splinting.  - PRN tramadol with respiratory hold parameters.  DVT PPx: Lovenox   Dispo: Disposition is deferred at this time, awaiting  improvement of current medical problems.  Anticipated discharge in approximately 1-2 day(s).   The patient does have a current PCP (BROWN, RYAN, MD), therefore will be requiring OPC follow-up after discharge.   The patient does not have transportation limitations that hinder transportation to clinic appointments.  .Services Needed at time of discharge: Y = Yes, Blank = No PT:   OT:  RN:   Equipment:   Other:     LOS: 1 day   Genelle Gather 09/09/2012, 11:12 AM

## 2012-09-09 NOTE — Progress Notes (Signed)
RT taught patient how to use a MDI spacer when she uses her inhaler. Patient understood and explained procedure back to RT.

## 2012-09-09 NOTE — H&P (Signed)
Internal Medicine Teaching Service Attending Note Date: 09/09/2012  Patient name: Maria Nelson  Medical record number: [id number]  Date of birth: [birth year]  I have read the documentation on this case by Dr. Thad Ranger and agree with it with the following additions/observations:   The patient, Maria Nelson, is a 57 y.o. year old female, with past medical history of COPD, smoker of half a pack per day but quit smoking in 2012, who comes in with the chief  complaint of worsening shortness of breath for 5 days. I have read the HPI by Dr. Thad Ranger, and I agree with the chronology of events.   Today the patient feels much better, and is able to breath comfortably. She previously had cough with yellow sputum which is turning to white today. She admits to chest pain related to cough which is constantly present. She denies any dizziness, fainting or nausea vomiting fever chills.   Past medical history, social history and medications have been reviewed.   Review of systems as per HPI and resident note.   Filed Vitals:   09/09/12 0730 09/09/12 0740 09/09/12 0800 09/09/12 0954  BP:  116/76    Pulse: 60 80    Temp:   98.3 F (36.8 C)   TempSrc:   Oral   Resp: 14 20    Height:      Weight:      SpO2: 97% 96%  93%   Exam Vitals reviewed.  General: [place] female, resting in bed. HEENT: PERRL, EOMI, no scleral icterus. Heart: RRR, no rubs, murmurs or gallops. Lungs: vesicular breath sounds with okay air movement on both sides though it still feels a little tight to me, no wheezes, rales, or rhonchi. Abdomen: Soft, nontender, nondistended, BS present. Extremities: Warm, no pedal edema. Neuro: Alert and oriented X3, cranial nerves II-XII grossly intact,  strength and sensation to light touch equal in bilateral upper and lower extremities   Recent Labs Lab 09/08/12 1741  HGB 13.6  HCT 39.7  WBC 14.0*  PLT 262    Recent Labs Lab 09/08/12 1741  NA 139  K 3.4*   CL 99  CO2 26  GLUCOSE 115*  BUN 11  CREATININE 0.99  CALCIUM 9.3  MG 2.0    Recent Labs Lab 09/08/12 1741  AST 14  ALT 11  ALKPHOS 102  BILITOT 0.2*  PROT 7.3  ALBUMIN 3.3*   ABG    Component Value Date/Time   PHART 7.429 09/08/2012 2059   PCO2ART 42.0 09/08/2012 2059   PO2ART 64.0* 09/08/2012 2059   HCO3 27.8* 09/08/2012 2059   TCO2 29 09/08/2012 2059   ACIDBASEDEF 2.0 01/21/2008 1737   O2SAT 92.0 09/08/2012 2059    Assessment and Plan  COPD Exacerbation - Continue Solumedrol for today, at 60 BID and reassess tomorrow for taper or switch to prednisone. Agree with doxycyline since the patient was in acute respiratory distress and change in color of sputum. She can be transferred to a less intensive unit if she stays stable and continues to improve today.   Other issues: Replete electrolytes as needed. Cardiology work up can be done once the patient is stable. ECHO okay to do now to evaluate right heart situation with this possible chronic lung disease which the patient might be harboring for years.    Yogesh Cominsky 09/09/2012, 10:21 AM.

## 2012-09-10 DIAGNOSIS — R0989 Other specified symptoms and signs involving the circulatory and respiratory systems: Secondary | ICD-10-CM

## 2012-09-10 DIAGNOSIS — R0609 Other forms of dyspnea: Secondary | ICD-10-CM

## 2012-09-10 LAB — BASIC METABOLIC PANEL
GFR calc non Af Amer: 78 mL/min — ABNORMAL LOW (ref >90)
Glucose, Bld: 146 mg/dL — ABNORMAL HIGH (ref 70–99)
Potassium: 4.5 mEq/L (ref 3.5–5.1)
Sodium: 139 mEq/L (ref 135–145)

## 2012-09-10 MED ORDER — PREDNISONE 20 MG PO TABS
40.0000 mg | ORAL_TABLET | Freq: Every day | ORAL | Status: DC
  Administered 2012-09-10 – 2012-09-11 (×2): 40 mg via ORAL
  Filled 2012-09-10 (×3): qty 2

## 2012-09-10 NOTE — Progress Notes (Signed)
O2sat with activity 97% HR81. Barbera Setters RN

## 2012-09-10 NOTE — Progress Notes (Signed)
  Echocardiogram 2D Echocardiogram has been performed.  Maria Nelson 09/10/2012, 2:37 PM

## 2012-09-10 NOTE — Progress Notes (Addendum)
Subjective: Feels improved since admission, but still gets extremely SOB and fatigued with minimal exertion.  No fever/chills/chest pain.  Objective: Vital signs in last 24 hours: Filed Vitals:   09/09/12 1601 09/09/12 2103 09/09/12 2119 09/10/12 0606  BP:   129/84 124/76  Pulse:   79 79  Temp:   98.6 F (37 C) 98.1 F (36.7 C)  TempSrc:      Resp:   18 18  Height:      Weight:      SpO2: 98% 97% 94% 94%   Weight change:   Intake/Output Summary (Last 24 hours) at 09/10/12 1007 Last data filed at 09/10/12 0800  Gross per 24 hour  Intake    600 ml  Output    150 ml  Net    450 ml   General: resting in bed, no acute distress HEENT: PERRL, EOMI, no scleral icterus Cardiac: RRR, no rubs, murmurs or gallops Pulm: bilateral expiratory wheezing with improved air mvmt and mild respiratory distress, no use of accessory muscles. Abd: soft, nontender, nondistended, BS normoactive Ext: warm and well perfused, no pedal edema Neuro: alert and oriented X3, cranial nerves II-XII grossly intact  Lab Results: Basic Metabolic Panel:  Recent Labs Lab 09/08/12 1741 09/10/12 0545  NA 139 139  K 3.4* 4.5  CL 99 103  CO2 26 27  GLUCOSE 115* 146*  BUN 11 15  CREATININE 0.99 0.82  CALCIUM 9.3 10.3  MG 2.0  --    Liver Function Tests:  Recent Labs Lab 09/08/12 1741  AST 14  ALT 11  ALKPHOS 102  BILITOT 0.2*  PROT 7.3  ALBUMIN 3.3*   CBC:  Recent Labs Lab 09/08/12 1741  WBC 14.0*  NEUTROABS 9.0*  HGB 13.6  HCT 39.7  MCV 80.0  PLT 262   Cardiac Enzymes:  Recent Labs Lab 09/08/12 2243 09/09/12 0540 09/09/12 1140  TROPONINI <0.30 <0.30 <0.30   Fasting Lipid Panel:  Recent Labs Lab 09/09/12 0540  CHOL 185  HDL 81  LDLCALC 93  TRIG 55  CHOLHDL 2.3   Thyroid Function Tests:  Recent Labs Lab 09/09/12 0540  TSH 0.347*   Urine Drug Screen: Drugs of Abuse     Component Value Date/Time   LABOPIA NONE DETECTED 12/11/2010 0651   COCAINSCRNUR NONE  DETECTED 12/11/2010 0651   COCAINSCRNUR NEG 12/16/2009 2110   LABBENZ NONE DETECTED 12/11/2010 0651   LABBENZ NEG 12/16/2009 2110   AMPHETMU NONE DETECTED 12/11/2010 0651   AMPHETMU NEG 12/16/2009 2110   THCU NONE DETECTED 12/11/2010 0651   LABBARB NONE DETECTED 12/11/2010 1610     Micro Results: Recent Results (from the past 240 hour(s))  MRSA PCR SCREENING     Status: None   Collection Time    09/09/12 12:40 AM      Result Value Range Status   MRSA by PCR NEGATIVE  NEGATIVE Final   Comment:            The GeneXpert MRSA Assay (FDA     approved for NASAL specimens     only), is one component of a     comprehensive MRSA colonization     surveillance program. It is not     intended to diagnose MRSA     infection nor to guide or     monitor treatment for     MRSA infections.  CULTURE, EXPECTORATED SPUTUM-ASSESSMENT     Status: None   Collection Time    09/09/12  1:40 AM  Result Value Range Status   Specimen Description SPUTUM   Final   Special Requests NONE   Final   Sputum evaluation     Final   Value: THIS SPECIMEN IS ACCEPTABLE. RESPIRATORY CULTURE REPORT TO FOLLOW.   Report Status 09/09/2012 FINAL   Final   Studies/Results: Dg Chest 2 View (if Patient Has Fever And/or Copd)  09/08/2012   *RADIOLOGY REPORT*  Clinical Data: Cough.  Short of breath.  CHEST - 2 VIEW  Comparison: 09/03/2011  Findings: Hyperaeration.  Bronchitic changes.  Chronic interstitial changes.  No new consolidation or mass.  No pleural effusion.  No definite Kerley B lines to suggest edema.  Upper normal heart size.  IMPRESSION: No active cardiopulmonary disease.  Changes related to COPD are noted.   Original Report Authenticated By: Jolaine Click, M.D.   Medications: I have reviewed the patient's current medications. Scheduled Meds: . albuterol  2.5 mg Nebulization TID  . doxycycline  100 mg Oral Q12H  . enoxaparin (LOVENOX) injection  40 mg Subcutaneous Q24H  . guaiFENesin  600 mg Oral BID  .  ipratropium  0.5 mg Nebulization TID  . pantoprazole  40 mg Oral Daily  . predniSONE  40 mg Oral Q breakfast  . sodium chloride  3 mL Intravenous Q12H  . traZODone  25 mg Oral QODAY   Continuous Infusions:  PRN Meds:.acetaminophen, acetaminophen, albuterol, albuterol, ondansetron (ZOFRAN) IV, ondansetron, traMADol  Assessment/Plan: Pt is a 57 y.o. yo female with a PMHx of non-oxygen dependent COPD and asthma (w/ no previous intubations), PUD 2/2 H.pylori in 2008, obesity, OSA with CPAP noncompliance who was admitted on 09/08/2012 with an acute COPD exacerbation with asthma likely 2/2 viral URI.   Acute exacerbation of COPD with asthma secondary viral URI: Improving. Not on home O2; Saturating >90% on RA, but with exertional dyspnea & persistent cough with sputum production worse than baseline still.  Oxygen per [place] with goal O2 sat > 92%  - Check O2 sat with ambulation. - Doxycycline 100 mg bid (Day 2/5) - Change Solumedrol 60mg  IV BID to prednisone 40mg  qAM (5 days total) - Scheduled Albuterol / Atrovent TID (patient will still benefit from scheduled nebs, also will need to make sure she is discharged with anticholinergic agent) - PRN Albuterol/ Atrovent q4h  - 2D ECHO pending - Outpt PFTs 4-6 weeks from acte exacerbation   Hypokalemia: Resolved. Likely in setting of excessive albuterol usage, Mg normal.   OSA: No active issues, patient refuses CPAP  Chronic pain: H/o bilateral carpal tunnel syndrome and chroninc back pain. Continue wrist splinting.  - PRN tramadol with respiratory hold parameters.   ?Hyperthyroidism: TSH mildly low at admission (0.347); will not add free T4 at this time given acute illness, will need to be followed outpatient.  DVT PPx: Lovenox  Dispo: Disposition is deferred at this time, awaiting improvement of current medical problems.  Anticipated discharge in approximately 1-2 day(s).   The patient does have a current PCP (BROWN, RYAN, MD), therefore will be  requiring OPC follow-up after discharge.   The patient does not have transportation limitations that hinder transportation to clinic appointments.  .Services Needed at time of discharge: Y = Yes, Blank = No PT:   OT:   RN:   Equipment:   Other:     LOS: 2 days   Takina Busser 09/10/2012, 10:07 AM

## 2012-09-11 DIAGNOSIS — E669 Obesity, unspecified: Secondary | ICD-10-CM

## 2012-09-11 DIAGNOSIS — J45901 Unspecified asthma with (acute) exacerbation: Secondary | ICD-10-CM

## 2012-09-11 DIAGNOSIS — K219 Gastro-esophageal reflux disease without esophagitis: Secondary | ICD-10-CM

## 2012-09-11 DIAGNOSIS — R0609 Other forms of dyspnea: Secondary | ICD-10-CM

## 2012-09-11 LAB — BASIC METABOLIC PANEL
BUN: 21 mg/dL (ref 6–23)
CO2: 28 mEq/L (ref 19–32)
Calcium: 9.9 mg/dL (ref 8.4–10.5)
Chloride: 102 mEq/L (ref 96–112)
Creatinine, Ser: 0.95 mg/dL (ref 0.50–1.10)
Glucose, Bld: 91 mg/dL (ref 70–99)

## 2012-09-11 MED ORDER — GUAIFENESIN ER 600 MG PO TB12: 600.0000 mg | ORAL_TABLET | Freq: Two times a day (BID) | ORAL | Status: DC

## 2012-09-11 MED ORDER — IPRATROPIUM BROMIDE 0.02 % IN SOLN: 0.5000 mg | Freq: Four times a day (QID) | RESPIRATORY_TRACT | Status: DC

## 2012-09-11 MED ORDER — PREDNISONE 20 MG PO TABS: 40.0000 mg | ORAL_TABLET | Freq: Every day | ORAL | Status: DC

## 2012-09-11 MED ORDER — ALBUTEROL SULFATE (5 MG/ML) 0.5% IN NEBU: 2.5000 mg | INHALATION_SOLUTION | Freq: Four times a day (QID) | RESPIRATORY_TRACT | Status: DC

## 2012-09-11 MED ORDER — HYDROCODONE-ACETAMINOPHEN 5-325 MG PO TABS: 1.0000 | ORAL_TABLET | Freq: Two times a day (BID) | ORAL | Status: DC | PRN

## 2012-09-11 MED ORDER — DOXYCYCLINE HYCLATE 100 MG PO TABS: 100.0000 mg | ORAL_TABLET | Freq: Two times a day (BID) | ORAL | Status: DC

## 2012-09-11 NOTE — Progress Notes (Signed)
Subjective: Feels much better since admission. Still with cough and white/yellow sputum. No fever/chills/chest pain.  Objective: Vital signs in last 24 hours: Filed Vitals:   09/10/12 1502 09/10/12 2042 09/11/12 0530 09/11/12 0829  BP:  124/85 131/90   Pulse:  62 58   Temp:  97.9 F (36.6 C) 98.2 F (36.8 C)   TempSrc:      Resp:  18 18   Height:      Weight:      SpO2: 94% 95% 95% 96%   Weight change:   Intake/Output Summary (Last 24 hours) at 09/11/12 1021 Last data filed at 09/10/12 1300  Gross per 24 hour  Intake    480 ml  Output      0 ml  Net    480 ml   General: resting in bed, no acute distress HEENT: PERRL, EOMI, no scleral icterus Cardiac: RRR, no rubs, murmurs or gallops Pulm: Mild bilateral expiratory wheezing with improved air mvmt, no respiratory distress, no use of accessory muscles. Abd: soft, nontender, nondistended, BS normoactive Ext: warm and well perfused, no pedal edema Neuro: alert and oriented X3, cranial nerves II-XII grossly intact  Lab Results: Basic Metabolic Panel:  Recent Labs Lab 09/08/12 1741 09/10/12 0545 09/11/12 0440  NA 139 139 140  K 3.4* 4.5 3.7  CL 99 103 102  CO2 26 27 28   GLUCOSE 115* 146* 91  BUN 11 15 21   CREATININE 0.99 0.82 0.95  CALCIUM 9.3 10.3 9.9  MG 2.0  --   --    Liver Function Tests:  Recent Labs Lab 09/08/12 1741  AST 14  ALT 11  ALKPHOS 102  BILITOT 0.2*  PROT 7.3  ALBUMIN 3.3*   CBC:  Recent Labs Lab 09/08/12 1741  WBC 14.0*  NEUTROABS 9.0*  HGB 13.6  HCT 39.7  MCV 80.0  PLT 262   Cardiac Enzymes:  Recent Labs Lab 09/08/12 2243 09/09/12 0540 09/09/12 1140  TROPONINI <0.30 <0.30 <0.30   Fasting Lipid Panel:  Recent Labs Lab 09/09/12 0540  CHOL 185  HDL 81  LDLCALC 93  TRIG 55  CHOLHDL 2.3   Thyroid Function Tests:  Recent Labs Lab 09/09/12 0540  TSH 0.347*   Urine Drug Screen: Drugs of Abuse     Component Value Date/Time   LABOPIA NONE DETECTED 12/11/2010  0651   COCAINSCRNUR NONE DETECTED 12/11/2010 0651   COCAINSCRNUR NEG 12/16/2009 2110   LABBENZ NONE DETECTED 12/11/2010 0651   LABBENZ NEG 12/16/2009 2110   AMPHETMU NONE DETECTED 12/11/2010 0651   AMPHETMU NEG 12/16/2009 2110   THCU NONE DETECTED 12/11/2010 0651   LABBARB NONE DETECTED 12/11/2010 1610     Micro Results: Recent Results (from the past 240 hour(s))  MRSA PCR SCREENING     Status: None   Collection Time    09/09/12 12:40 AM      Result Value Range Status   MRSA by PCR NEGATIVE  NEGATIVE Final   Comment:            The GeneXpert MRSA Assay (FDA     approved for NASAL specimens     only), is one component of a     comprehensive MRSA colonization     surveillance program. It is not     intended to diagnose MRSA     infection nor to guide or     monitor treatment for     MRSA infections.  CULTURE, EXPECTORATED SPUTUM-ASSESSMENT     Status: None  Collection Time    09/09/12  1:40 AM      Result Value Range Status   Specimen Description SPUTUM   Final   Special Requests NONE   Final   Sputum evaluation     Final   Value: THIS SPECIMEN IS ACCEPTABLE. RESPIRATORY CULTURE REPORT TO FOLLOW.   Report Status 09/09/2012 FINAL   Final  CULTURE, RESPIRATORY (NON-EXPECTORATED)     Status: None   Collection Time    09/09/12  1:40 AM      Result Value Range Status   Specimen Description SPUTUM   Final   Special Requests NONE   Final   Gram Stain PENDING   Incomplete   Culture Culture reincubated for better growth   Final   Report Status PENDING   Incomplete   Studies/Results: No results found. Medications: I have reviewed the patient's current medications. Scheduled Meds: . albuterol  2.5 mg Nebulization TID  . doxycycline  100 mg Oral Q12H  . enoxaparin (LOVENOX) injection  40 mg Subcutaneous Q24H  . guaiFENesin  600 mg Oral BID  . ipratropium  0.5 mg Nebulization TID  . pantoprazole  40 mg Oral Daily  . predniSONE  40 mg Oral Q breakfast  . sodium chloride  3 mL  Intravenous Q12H  . traZODone  25 mg Oral QODAY   Continuous Infusions:  PRN Meds:.acetaminophen, acetaminophen, albuterol, albuterol, ondansetron (ZOFRAN) IV, ondansetron, traMADol  Assessment/Plan: Pt is a 57 y.o. yo female with a PMHx of non-oxygen dependent COPD and asthma (w/ no previous intubations), PUD 2/2 H.pylori in 2008, obesity, OSA with CPAP noncompliance who was admitted on 09/08/2012 with an acute COPD exacerbation with asthma likely 2/2 viral URI.   Acute exacerbation of COPD with asthma secondary viral URI: Improving. H/o COPD with chronic dyspnea, not on home O2. Saturating >90% on RA, but with exertional dyspnea & persistent cough with sputum production worse than baseline on admission.  She was started on Doxycycline (day 3/5) and IV steroids, which have been transitioned to po Prednisone. Oxygen per [place] with goal O2 sat > 92%, but she has not required O2 if >48hrs. Per nursing, O2 with exertion 97%. A 2D ECHO was performed 2/2 to chronic dyspnea, results still pending, and will likely need to be followed up as an outpatient.  - Doxycycline 100 mg bid (Day 3/5) - Prednisone 40mg  qAM (5 days total) - Tussinex cough suppressant, will transition to po Vicodin at d/c 2/2 financial constraints. - Scheduled Albuterol / Atrovent TID (patient will still benefit from scheduled nebs, also will need to make sure she is discharged with anticholinergic agent) - PRN Albuterol/ Atrovent q4h  - Outpt PFTs 4-6 weeks from acte exacerbation   Hypokalemia: Resolved. Likely in setting of excessive albuterol usage, Mg normal. Potassium 3.7 today.  OSA: No active issues, patient refuses CPAP  Chronic pain: H/o bilateral carpal tunnel syndrome and chroninc back pain. Continue wrist splinting.  - PRN tramadol with respiratory hold parameters.   Possible hyperthyroidism: TSH mildly low at admission (0.347); will not add free T4 at this time given acute illness, will need to be followed  outpatient.  DVT PPx: Lovenox  Dispo: D/c to home today   The patient does have a current PCP (Manson Passey, RYAN, MD), therefore will be requiring OPC follow-up after discharge.   The patient does not have transportation limitations that hinder transportation to clinic appointments.  .Services Needed at time of discharge: Y = Yes, Blank = No PT:  OT:   RN:   Equipment:   Other:     LOS: 3 days   Maria Nelson 09/11/2012, 10:21 AM

## 2012-09-11 NOTE — Progress Notes (Deleted)
Internal Medicine Teaching [facility] Discharge Note  Name: Maria Nelson MRN: [id number] DOB: Jun 14, 1955 57 y.o.  Date of Admission: 09/08/2012  5:53 PM Date of Discharge: 09/11/2012 Attending Physician: Inez Catalina, MD  Discharge Diagnosis: Principal Problem:   Acute exacerbation of COPD with asthma Active Problems:   ASTHMA   COPD   PEPTIC ULCER DISEASE   Sleep apnea   Carpal tunnel syndrome   Acute bronchitis   Obesity (BMI 30-39.9)   Dyspnea on exertion   Sinus tachycardia   Hypokalemia   Discharge Medications:   Medication List    TAKE these medications       albuterol (2.5 MG/3ML) 0.083% nebulizer solution  Commonly known as:  PROVENTIL  Take 3 mLs (2.5 mg total) by nebulization every 6 (six) hours as needed for wheezing.     albuterol (5 MG/ML) 0.5% nebulizer solution  Commonly known as:  PROVENTIL  Take 0.5 mLs (2.5 mg total) by nebulization every 6 (six) hours.     albuterol 108 (90 BASE) MCG/ACT inhaler  Commonly known as:  PROVENTIL HFA;VENTOLIN HFA  Inhale 2 puffs into the lungs every 6 (six) hours as needed for wheezing.     desloratadine 5 MG tablet  Commonly known as:  CLARINEX  Take 5 mg by mouth daily.     doxycycline 100 MG tablet  Commonly known as:  VIBRA-TABS  Take 1 tablet (100 mg total) by mouth every 12 (twelve) hours.     EPIPEN 0.3 mg/0.3 mL Devi  Generic drug:  EPINEPHrine  Inject 0.3 mg into the muscle once as needed (allergic reaction).     Fluticasone-Salmeterol 500-50 MCG/DOSE Aepb  Commonly known as:  ADVAIR  Inhale 1 puff into the lungs 2 (two) times daily.     guaiFENesin 600 MG 12 hr tablet  Commonly known as:  MUCINEX  Take 1 tablet (600 mg total) by mouth 2 (two) times daily.     HYDROcodone-acetaminophen 5-325 MG per tablet  Commonly known as:  NORCO/VICODIN  Take 1 tablet by mouth 2 (two) times daily as needed for pain.     ipratropium 0.02 % nebulizer solution  Commonly known as:  ATROVENT  Take  2.5 mLs (500 mcg total) by nebulization 4 (four) times daily as needed (shortness of breath.).     ipratropium 0.02 % nebulizer solution  Commonly known as:  ATROVENT  Take 2.5 mLs (0.5 mg total) by nebulization every 6 (six) hours.     pantoprazole 40 MG tablet  Commonly known as:  PROTONIX  Take 1 tablet (40 mg total) by mouth daily.     predniSONE 20 MG tablet  Commonly known as:  DELTASONE  Take 2 tablets (40 mg total) by mouth daily with breakfast.  Start taking on:  09/12/2012     traMADol 50 MG tablet  Commonly known as:  ULTRAM  Take 2 tablets (100 mg total) by mouth every 8 (eight) hours as needed for pain.     traZODone 25 mg Tabs  Commonly known as:  DESYREL  Take 25 mg by mouth every other day.        Disposition and follow-up:   [name] was discharged from [facility] in Stable condition.  At the hospital follow up visit please address  - F/u on ECHO results, these were not back prior to d/c home. This was obtained 2/2 chronic dyspnea - Evaluate if respiratory sx have improved/resolved - She will need PFTs in 4-6 weeks from  COPD exacerbation - Check BMP to evaluate potassium - Her mildly suppressed TSH (0.347, range 0.35-4.5) should be evaluated as an outpatient.  Follow-up Appointments:     Follow-up Information   Follow up with [name], MD On 09/20/2012. (At 2:45pm)    Contact information:   [address] [place] [address] [phone]      Discharge Orders   Future Appointments Provider Department Dept Phone   09/20/2012 2:45 PM Linward Headland, MD [facility] [phone]   Future Orders Complete By Expires     Call MD for:  difficulty breathing, headache or visual disturbances  As directed     Call MD for:  extreme fatigue  As directed     Call MD for:  temperature >100.4  As directed     Diet - low sodium heart healthy  As directed     Discharge instructions  As directed      Comments:      **Continue to take the Prednisone for 1 more day, starting tomorrow (you have already received your dose for today) **Continue to take the Doxycycline tonight and twice a day on Tuesday and Wednesday. **Continue to use the Albuterol and Atrovent nebulizer treatments every 6 hours for the next 2 days, and then use as needed for shortness of breath or wheezing. **Continue the Mucinex(Guafenesin) twice a day for congestion **You can take the Vicodin (hydrocodone-acetaminophen) twice a day for your cough.    Increase activity slowly  As directed        Consultations:    Procedures Performed:  Dg Chest 2 View (if Patient Has Fever And/or Copd)  09/08/2012   *RADIOLOGY REPORT*  Clinical Data: Cough.  Short of breath.  CHEST - 2 VIEW  Comparison: 09/03/2011  Findings: Hyperaeration.  Bronchitic changes.  Chronic interstitial changes.  No new consolidation or mass.  No pleural effusion.  No definite Kerley B lines to suggest edema.  Upper normal heart size.  IMPRESSION: No active cardiopulmonary disease.  Changes related to COPD are noted.   Original Report Authenticated By: Jolaine Click, M.D.    2D Echo: 5/214: Results pending at the time of discharge.  Admission HPI:  History of Present Illness:  Patient is a 57 y.o. female with a PMHx of non-oxygen dependent COPD and asthma (has not previously required intubation), PUD 2/2 H.pylori in 2008, obesity, OSA with CPAP noncompliance who presents to [facility] for evaluation of shortness of breath and cough x 5 days. Pt indicates that at baseline, she has significant DOE to the point that she limits her activities with even minimal exertion (< 1 block). However, since 5 days PTA, she has had progressively worsening shortness of breath atop her chronic symptoms. This SOB has been refractory to scheduled albuterol q4h (which is increased from her typical TID usage) in addition to BID albuterol/ Atrovent nebulizers. She has additionally experienced  productive cough with thick yellow sputum, decreased appetite, chills, subjective fevers, sore throat.  On initial presentation, it seems the pt had severe respiratory distress, diffuse wheezing, and decreased breath sounds. She additionally was desaturating to 85 % on room air and only able to speak in short sentences with mild use of accessory muscles. She was provided 1 hour continuous albuterol treatment, oral prednisone, and IV Levaquin with some subjective improvement of symptoms and more ease of breath.  Review of Systems:  Constitutional:  admits to chills, possible subjective fever, chills, decreased appetite and fatigue.  HEENT:  admits to nasal congestion, sore throat.  Denies eye pain, hearing loss, ear pain, ear discharge.   Respiratory:  admits to SOB, acute on chronic DOE, increased WOB, productive cough, chest tightness.  Denies wheezing.   Cardiovascular:  admits to chest wall, pain after prolonged cough, palpitations.  Denies leg swelling, palpitations.   Gastrointestinal:  admits to nausea, post-tussive emesis.  Denies abdominal pain, diarrhea, constipation, blood in stool.   Genitourinary:  denies dysuria, urgency, frequency, hematuria, flank pain.   Musculoskeletal:  admits to chronic back pain unchanged from baseline, myalgias.  Denies joint swelling, arthralgias and gait problem.   Skin:  denies rash and wound.   Neurological:  admits to generalized weakness, lightheadedness.  Denies dizziness, seizures, syncope, numbness and headaches.   Hematological:  denies easy bruising, personal or family bleeding history.   Psychiatric:  denies suicidal ideation, mood changes, sleep disturbance and agitation.   Vital Signs:  Blood pressure 140/85, pulse 91, temperature 98.8 F (37.1 C), temperature source Oral, resp. rate 24, SpO2 95.00%.  Physical Exam:  General:  Vital signs reviewed and noted. Obese woman that is in mild distress due to cough. Able to speak in full sentences  without difficulty. No accessory muscle usage. She is alert and cooperative throughout examination.   Head:  Normocephalic, atraumatic.   Eyes:  PERRL, No signs of anemia or jaundince.   Nose:  Mucous membranes moist, inflammed, nonerythematous.   Throat:  Oropharynx mildly erythematous, no exudate appreciated.   Neck:  No deformities, masses, or tenderness noted. Supple, No carotid Bruits, no JVD.   Lungs:  Normal respiratory effort. Diffuse limitation of air movement with decreased breath sounds. Occasional expiratory wheeze   Heart:  RRR. S1 and S2 normal without gallop, murmur, or rubs.   Abdomen:  BS normoactive. Soft, Nondistended, non-tender. No masses or organomegaly.   Extremities:  No pretibial edema.   Neurologic:  A&O X3, CN II - XII are grossly intact. Motor strength is 5/5 in the all 4 extremities, Sensations intact to light touch, Cerebellar signs negative.   Skin:  No visible rashes, scars.     Hospital Course by problem list: Pt is a 57 y.o. yo female with a PMHx of non-oxygen dependent COPD and asthma (w/ no previous intubations), PUD 2/2 H.pylori in 2008, obesity, OSA with CPAP noncompliance who was admitted on 09/08/2012 with an acute COPD exacerbation with asthma likely 2/2 viral URI.   Acute exacerbation of COPD with asthma secondary viral URI: Improving.  H/o COPD with chronic dyspnea, not on home O2. Saturating >90% on RA, but with exertional dyspnea & persistent cough with sputum production worse than baseline on admission. She was started on Doxycycline (day 3/5) and IV steroids, which have been transitioned to po Prednisone. Oxygen per [place] with goal O2 sat > 92%, but she has not required O2 if >48hrs. Per nursing, O2 with exertion 97%. A 2D ECHO was performed 2/2 to chronic dyspnea, results still pending, and will likely need to be followed up as an outpatient.  - Doxycycline 100 mg bid (day 3/5)  - Prednisone 40mg  qAM (day 4/5)  - Po Vicodin at d/c 2/2 financial  constraints and may not be able to afford Tussinex.  - Scheduled Albuterol / Atrovent q6h x2 days, then PRN - Outpt PFTs 4-6 weeks from acte exacerbation   Hypokalemia: Resolved.  Potassium 3.4 on admission, likely in setting of excessive albuterol usage. Mg normal. Potassium was replaced. Potassium 3.7 today.  OSA: No active issues. Patient supposed to be on CPAP at home, but does not use.   Chronic pain: H/o bilateral carpal tunnel syndrome and chroninc back pain. Has wrist splint which she has been using in the hospital, also taking Tramadol for pain. No current issues.   Possible hyperthyroidism: TSH mildly low at admission (0.347); did not add free T4 at this time given acute illness, will need to be followed outpatient.     Discharge Vitals:  BP 131/90  Pulse 58  Temp(Src) 98.2 F (36.8 C) (Oral)  Resp 18  Ht 5' (1.524 m)  Wt 201 lb 1 oz (91.2 kg)  BMI 39.27 kg/m2  SpO2 96%  Discharge Labs:  Results for orders placed during the hospital encounter of 09/08/12 (from the past 24 hour(s))  BASIC METABOLIC PANEL     Status: Abnormal   Collection Time    09/11/12  4:40 AM      Result Value Range   Sodium 140  135 - 145 mEq/L   Potassium 3.7  3.5 - 5.1 mEq/L   Chloride 102  96 - 112 mEq/L   CO2 28  19 - 32 mEq/L   Glucose, Bld 91  70 - 99 mg/dL   BUN 21  6 - 23 mg/dL   Creatinine, Ser 4.09  0.50 - 1.10 mg/dL   Calcium 9.9  8.4 - 81.1 mg/dL   GFR calc non Af Amer 65 (*) >90 mL/min   GFR calc Af Amer 76 (*) >90 mL/min    Signed: Genelle Gather 09/11/2012, 11:18 AM   Time Spent on Discharge: Services Ordered on Discharge: None Equipment Ordered on Discharge: None

## 2012-09-11 NOTE — Progress Notes (Signed)
A 2nd script for vicodin printed to be signed by myself.  Script printed by Dr. Sherrine Maples was placed in bin for shredding.  Stacy Gardner, MD

## 2012-09-11 NOTE — Discharge Summary (Signed)
Internal Medicine Teaching [facility] Discharge Note  Name: Maria Nelson MRN: [id number] DOB: 02/14/1956 57 y.o.  Date of Admission: 09/08/2012  5:53 PM Date of Discharge: 09/11/2012 Attending Physician: Inez Catalina, MD  Discharge Diagnosis: Principal Problem:   Acute exacerbation of COPD with asthma Active Problems:   ASTHMA   COPD   PEPTIC ULCER DISEASE   Sleep apnea   Carpal tunnel syndrome   Acute bronchitis   Obesity (BMI 30-39.9)   Dyspnea on exertion   Sinus tachycardia   Hypokalemia   Discharge Medications:   Medication List    TAKE these medications       albuterol (2.5 MG/3ML) 0.083% nebulizer solution  Commonly known as:  PROVENTIL  Take 3 mLs (2.5 mg total) by nebulization every 6 (six) hours as needed for wheezing.     albuterol (5 MG/ML) 0.5% nebulizer solution  Commonly known as:  PROVENTIL  Take 0.5 mLs (2.5 mg total) by nebulization every 6 (six) hours.     albuterol 108 (90 BASE) MCG/ACT inhaler  Commonly known as:  PROVENTIL HFA;VENTOLIN HFA  Inhale 2 puffs into the lungs every 6 (six) hours as needed for wheezing.     desloratadine 5 MG tablet  Commonly known as:  CLARINEX  Take 5 mg by mouth daily.     doxycycline 100 MG tablet  Commonly known as:  VIBRA-TABS  Take 1 tablet (100 mg total) by mouth every 12 (twelve) hours.     EPIPEN 0.3 mg/0.3 mL Devi  Generic drug:  EPINEPHrine  Inject 0.3 mg into the muscle once as needed (allergic reaction).     Fluticasone-Salmeterol 500-50 MCG/DOSE Aepb  Commonly known as:  ADVAIR  Inhale 1 puff into the lungs 2 (two) times daily.     guaiFENesin 600 MG 12 hr tablet  Commonly known as:  MUCINEX  Take 1 tablet (600 mg total) by mouth 2 (two) times daily.     HYDROcodone-acetaminophen 5-325 MG per tablet  Commonly known as:  NORCO/VICODIN  Take 1 tablet by mouth 2 (two) times daily as needed for pain.     ipratropium 0.02 % nebulizer solution  Commonly known as:  ATROVENT  Take  2.5 mLs (500 mcg total) by nebulization 4 (four) times daily as needed (shortness of breath.).     ipratropium 0.02 % nebulizer solution  Commonly known as:  ATROVENT  Take 2.5 mLs (0.5 mg total) by nebulization every 6 (six) hours.     pantoprazole 40 MG tablet  Commonly known as:  PROTONIX  Take 1 tablet (40 mg total) by mouth daily.     predniSONE 20 MG tablet  Commonly known as:  DELTASONE  Take 2 tablets (40 mg total) by mouth daily with breakfast.  Start taking on:  09/12/2012     traMADol 50 MG tablet  Commonly known as:  ULTRAM  Take 2 tablets (100 mg total) by mouth every 8 (eight) hours as needed for pain.     traZODone 25 mg Tabs  Commonly known as:  DESYREL  Take 25 mg by mouth every other day.        Disposition and follow-up:   [name] was discharged from [facility] in Stable condition.  At the hospital follow up visit please address  - F/u on ECHO results, these were not back prior to d/c home. This was obtained 2/2 chronic dyspnea - Evaluate if respiratory sx have improved/resolved - She will need PFTs in 4-6 weeks from  COPD exacerbation - Check BMP to evaluate potassium - Her mildly suppressed TSH (0.347, range 0.35-4.5) should be evaluated as an outpatient.  Follow-up Appointments:     Follow-up Information   Follow up with [name], MD On 09/20/2012. (At 2:45pm)    Contact information:   [address] [place] [address] [phone]      Discharge Orders   Future Appointments Provider Department Dept Phone   09/20/2012 2:45 PM Linward Headland, MD [facility] [phone]   Future Orders Complete By Expires     Call MD for:  difficulty breathing, headache or visual disturbances  As directed     Call MD for:  extreme fatigue  As directed     Call MD for:  temperature >100.4  As directed     Diet - low sodium heart healthy  As directed     Discharge instructions  As directed      Comments:      **Continue to take the Prednisone for 1 more day, starting tomorrow (you have already received your dose for today) **Continue to take the Doxycycline tonight and twice a day on Tuesday and Wednesday. **Continue to use the Albuterol and Atrovent nebulizer treatments every 6 hours for the next 2 days, and then use as needed for shortness of breath or wheezing. **Continue the Mucinex(Guafenesin) twice a day for congestion **You can take the Vicodin (hydrocodone-acetaminophen) twice a day for your cough.    Increase activity slowly  As directed        Consultations:    Procedures Performed:  Dg Chest 2 View (if Patient Has Fever And/or Copd)  09/08/2012   *RADIOLOGY REPORT*  Clinical Data: Cough.  Short of breath.  CHEST - 2 VIEW  Comparison: 09/03/2011  Findings: Hyperaeration.  Bronchitic changes.  Chronic interstitial changes.  No new consolidation or mass.  No pleural effusion.  No definite Kerley B lines to suggest edema.  Upper normal heart size.  IMPRESSION: No active cardiopulmonary disease.  Changes related to COPD are noted.   Original Report Authenticated By: Jolaine Click, M.D.    2D Echo: 5/214: Study Conclusions  - Left ventricle: The cavity size was normal. Wall thickness was normal. Systolic function was vigorous. The estimated ejection fraction was in the range of 65% to 70%. Wall motion was normal; there were no regional wall motion abnormalities. - Pulmonary arteries: PA peak pressure: 35mm Hg   Admission HPI:  History of Present Illness:  Patient is a 57 y.o. female with a PMHx of non-oxygen dependent COPD and asthma (has not previously required intubation), PUD 2/2 H.pylori in 2008, obesity, OSA with CPAP noncompliance who presents to [facility] for evaluation of shortness of breath and cough x 5 days. Pt indicates that at baseline, she has significant DOE to the point that she limits her activities with even minimal exertion (< 1 block). However, since 5 days  PTA, she has had progressively worsening shortness of breath atop her chronic symptoms. This SOB has been refractory to scheduled albuterol q4h (which is increased from her typical TID usage) in addition to BID albuterol/ Atrovent nebulizers. She has additionally experienced productive cough with thick yellow sputum, decreased appetite, chills, subjective fevers, sore throat.  On initial presentation, it seems the pt had severe respiratory distress, diffuse wheezing, and decreased breath sounds. She additionally was desaturating to 85 % on room air and only able to speak in short sentences with mild use of accessory  muscles. She was provided 1 hour continuous albuterol treatment, oral prednisone, and IV Levaquin with some subjective improvement of symptoms and more ease of breath.  Review of Systems:  Constitutional:  admits to chills, possible subjective fever, chills, decreased appetite and fatigue.   HEENT:  admits to nasal congestion, sore throat.  Denies eye pain, hearing loss, ear pain, ear discharge.   Respiratory:  admits to SOB, acute on chronic DOE, increased WOB, productive cough, chest tightness.  Denies wheezing.   Cardiovascular:  admits to chest wall, pain after prolonged cough, palpitations.  Denies leg swelling, palpitations.   Gastrointestinal:  admits to nausea, post-tussive emesis.  Denies abdominal pain, diarrhea, constipation, blood in stool.   Genitourinary:  denies dysuria, urgency, frequency, hematuria, flank pain.   Musculoskeletal:  admits to chronic back pain unchanged from baseline, myalgias.  Denies joint swelling, arthralgias and gait problem.   Skin:  denies rash and wound.   Neurological:  admits to generalized weakness, lightheadedness.  Denies dizziness, seizures, syncope, numbness and headaches.   Hematological:  denies easy bruising, personal or family bleeding history.   Psychiatric:  denies suicidal ideation, mood changes, sleep disturbance and agitation.    Vital Signs:  Blood pressure 140/85, pulse 91, temperature 98.8 F (37.1 C), temperature source Oral, resp. rate 24, SpO2 95.00%.  Physical Exam:  General:  Vital signs reviewed and noted. Obese woman that is in mild distress due to cough. Able to speak in full sentences without difficulty. No accessory muscle usage. She is alert and cooperative throughout examination.   Head:  Normocephalic, atraumatic.   Eyes:  PERRL, No signs of anemia or jaundince.   Nose:  Mucous membranes moist, inflammed, nonerythematous.   Throat:  Oropharynx mildly erythematous, no exudate appreciated.   Neck:  No deformities, masses, or tenderness noted. Supple, No carotid Bruits, no JVD.   Lungs:  Normal respiratory effort. Diffuse limitation of air movement with decreased breath sounds. Occasional expiratory wheeze   Heart:  RRR. S1 and S2 normal without gallop, murmur, or rubs.   Abdomen:  BS normoactive. Soft, Nondistended, non-tender. No masses or organomegaly.   Extremities:  No pretibial edema.   Neurologic:  A&O X3, CN II - XII are grossly intact. Motor strength is 5/5 in the all 4 extremities, Sensations intact to light touch, Cerebellar signs negative.   Skin:  No visible rashes, scars.     Hospital Course by problem list: Pt is a 57 y.o. yo female with a PMHx of non-oxygen dependent COPD and asthma (w/ no previous intubations), PUD 2/2 H.pylori in 2008, obesity, OSA with CPAP noncompliance who was admitted on 09/08/2012 with an acute COPD exacerbation with asthma likely 2/2 viral URI.   Acute exacerbation of COPD with asthma secondary viral URI: Improving.  H/o COPD with chronic dyspnea, not on home O2. Saturating >90% on RA, but with exertional dyspnea & persistent cough with sputum production worse than baseline on admission. She was started on Doxycycline (day 3/5) and IV steroids, which have been transitioned to po Prednisone. Oxygen per [place] with goal O2 sat > 92%, but she has not required O2 if >48hrs.  Per nursing, O2 with exertion 97%. A 2D ECHO was performed 2/2 to chronic dyspnea, results still pending, and will likely need to be followed up as an outpatient.  - Doxycycline 100 mg bid (day 3/5)  - Prednisone 40mg  qAM (day 4/5)  - Po Vicodin at d/c 2/2 financial constraints and may not be able to afford Tussinex.  -  Scheduled Albuterol / Atrovent q6h x2 days, then PRN - Outpt PFTs 4-6 weeks from acte exacerbation   Hypokalemia: Resolved.  Potassium 3.4 on admission, likely in setting of excessive albuterol usage. Mg normal. Potassium was replaced. Potassium 3.7 today.   OSA: No active issues. Patient supposed to be on CPAP at home, but does not use.   Chronic pain: H/o bilateral carpal tunnel syndrome and chroninc back pain. Has wrist splint which she has been using in the hospital, also taking Tramadol for pain. No current issues.   Possible hyperthyroidism: TSH mildly low at admission (0.347); did not add free T4 at this time given acute illness, will need to be followed outpatient.     Discharge Vitals:  BP 131/90  Pulse 58  Temp(Src) 98.2 F (36.8 C) (Oral)  Resp 18  Ht 5' (1.524 m)  Wt 201 lb 1 oz (91.2 kg)  BMI 39.27 kg/m2  SpO2 96%  Discharge Labs:  Results for orders placed during the hospital encounter of 09/08/12 (from the past 24 hour(s))  BASIC METABOLIC PANEL     Status: Abnormal   Collection Time    09/11/12  4:40 AM      Result Value Range   Sodium 140  135 - 145 mEq/L   Potassium 3.7  3.5 - 5.1 mEq/L   Chloride 102  96 - 112 mEq/L   CO2 28  19 - 32 mEq/L   Glucose, Bld 91  70 - 99 mg/dL   BUN 21  6 - 23 mg/dL   Creatinine, Ser 0.45  0.50 - 1.10 mg/dL   Calcium 9.9  8.4 - 40.9 mg/dL   GFR calc non Af Amer 65 (*) >90 mL/min   GFR calc Af Amer 76 (*) >90 mL/min    Signed: Genelle Gather 09/11/2012, 11:18 AM   Time Spent on Discharge: Services Ordered on Discharge: None Equipment Ordered on Discharge: None

## 2012-09-11 NOTE — Progress Notes (Signed)
UR COMPLETED  

## 2012-09-12 LAB — CULTURE, RESPIRATORY W GRAM STAIN

## 2012-09-13 ENCOUNTER — Encounter (HOSPITAL_COMMUNITY): Payer: Self-pay | Admitting: Emergency Medicine

## 2012-09-13 ENCOUNTER — Emergency Department (HOSPITAL_COMMUNITY)
Admission: EM | Admit: 2012-09-13 | Discharge: 2012-09-13 | Disposition: A | Discharge status: Home / Self Care | Payer: Medicaid Other | Source: Home / Self Care | Attending: Family Medicine | Admitting: Family Medicine

## 2012-09-13 ENCOUNTER — Encounter: Payer: Self-pay | Admitting: *Deleted

## 2012-09-13 ENCOUNTER — Emergency Department (INDEPENDENT_AMBULATORY_CARE_PROVIDER_SITE_OTHER): Payer: Medicaid Other

## 2012-09-13 DIAGNOSIS — Z124 Encounter for screening for malignant neoplasm of cervix: Secondary | ICD-10-CM

## 2012-09-13 DIAGNOSIS — J45901 Unspecified asthma with (acute) exacerbation: Secondary | ICD-10-CM

## 2012-09-13 DIAGNOSIS — S63509A Unspecified sprain of unspecified wrist, initial encounter: Secondary | ICD-10-CM

## 2012-09-13 DIAGNOSIS — Z8711 Personal history of peptic ulcer disease: Secondary | ICD-10-CM

## 2012-09-13 DIAGNOSIS — S60559A Superficial foreign body of unspecified hand, initial encounter: Secondary | ICD-10-CM

## 2012-09-13 DIAGNOSIS — M654 Radial styloid tenosynovitis [de Quervain]: Secondary | ICD-10-CM

## 2012-09-13 DIAGNOSIS — K219 Gastro-esophageal reflux disease without esophagitis: Secondary | ICD-10-CM

## 2012-09-13 DIAGNOSIS — E669 Obesity, unspecified: Secondary | ICD-10-CM

## 2012-09-13 DIAGNOSIS — S63502A Unspecified sprain of left wrist, initial encounter: Secondary | ICD-10-CM

## 2012-09-13 MED ORDER — HYDROCODONE-ACETAMINOPHEN 5-325 MG PO TABS
ORAL_TABLET | ORAL | Status: AC
  Filled 2012-09-13: qty 1

## 2012-09-13 MED ORDER — CELECOXIB 100 MG PO CAPS: 100.0000 mg | ORAL_CAPSULE | Freq: Two times a day (BID) | ORAL | Status: DC

## 2012-09-13 MED ORDER — HYDROCODONE-ACETAMINOPHEN 5-325 MG PO TABS
1.0000 | ORAL_TABLET | Freq: Once | ORAL | Status: AC
  Administered 2012-09-13: 1 via ORAL

## 2012-09-13 NOTE — Progress Notes (Signed)
Ok, agree

## 2012-09-13 NOTE — ED Notes (Signed)
Pt c/o left wrist pain onset this morning. Has a hx of carpal tunnel in that wrist. Was getting out of bed and twisted wrist. Is very painful and swollen. Has appt with ortho next week. Patient is alert and oriented.

## 2012-09-13 NOTE — Progress Notes (Unsigned)
Pt presents c/o L hand being injured when she rolled over the wrong way rising from the bed this am, it bent hand back the opposite way from it's normal rotation. There are no available appts today in clinic, she is referred to urg care and is agreeable

## 2012-09-13 NOTE — ED Provider Notes (Signed)
History     CSN: [id number]  Arrival date & time 09/13/12  1058   First MD Initiated Contact with Patient 09/13/12 1143      Chief Complaint  Patient presents with  . Wrist Pain    (Consider location/radiation/quality/duration/timing/severity/associated sxs/prior treatment) HPI Comments: 57 year old female with history of COPD among other chronic comorbidities. Here complaining of left wrist pain. Patient stated she rolled over her flexed left wrist were getting out of bed this morning. Denies direct blow or falls. Denies weakness or numbness. Patient states she has a history of carpal tunnel syndrome.   Past Medical History  Diagnosis Date  . COPD (chronic obstructive pulmonary disease)     secondary to tobacco use // No PFTs on file  . Gastrointestinal hemorrhage     secondary to PUD on March 2008  . Peptic ulcer disease     +H. pylori (antigen)- Dr. Juanda Chance- treated  . ETOH abuse     Pt stopped in 3/08  . Tobacco abuse     quit in 2010  . OSA on CPAP     noncompliant with CPAP (2/2 it being depressing)  . Urinary incontinence   . Carpal tunnel syndrome 06/23/2011    Patient notes history of bilateral carpal tunnel syndrome.  Now has symptoms of right hand carpal tunnel.   . 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 STRESS INCONTINENCE 01/24/2007    Past Surgical History  Procedure Laterality Date  . Tubal ligation    . Mandible surgery      due to trauma    Family History  Problem Relation Age of Onset  . Diabetes Sister   . Breast cancer Sister 6    died 2/2 breast ca age 15-60  . Diabetes Mother   . Coronary artery disease Mother 59    requiring 5 vessel CABG    History  Substance Use Topics  . Smoking status: Former Smoker -- 0.40 packs/day    Types: Cigarettes    Quit date: 12/04/2010  . Smokeless tobacco: Not on file     Comment: stopped 3 weeks ago  . Alcohol Use: Yes     Comment: now drinking a beer on weekends //     OB History   Grav Para Term Preterm  Abortions TAB SAB Ect Mult Living                  Review of Systems  Constitutional: Negative for fever.  Musculoskeletal:       As per history of present illness  Skin: Negative for rash and wound.  All other systems reviewed and are negative.    Allergies  Bee venom and Aspirin  Home Medications   Current Outpatient Rx  Name  Route  Sig  Dispense  Refill  . albuterol (PROVENTIL HFA;VENTOLIN HFA) 108 (90 BASE) MCG/ACT inhaler   Inhalation   Inhale 2 puffs into the lungs every 6 (six) hours as needed for wheezing.         [name] albuterol (PROVENTIL) (2.5 MG/3ML) 0.083% nebulizer solution   Nebulization   Take 3 mLs (2.5 mg total) by nebulization every 6 (six) hours as needed for wheezing.   75 mL   12   . albuterol (PROVENTIL) (5 MG/ML) 0.5% nebulizer solution   Nebulization   Take 0.5 mLs (2.5 mg total) by nebulization every 6 (six) hours.   20 mL   12   . celecoxib (CELEBREX) 100 MG capsule   Oral   Take 1 capsule (  100 mg total) by mouth 2 (two) times daily.   20 capsule   0   . desloratadine (CLARINEX) 5 MG tablet   Oral   Take 5 mg by mouth daily.         [name] doxycycline (VIBRA-TABS) 100 MG tablet   Oral   Take 1 tablet (100 mg total) by mouth every 12 (twelve) hours.   5 tablet   0   . EPINEPHrine (EPIPEN) 0.3 mg/0.3 mL DEVI   Intramuscular   Inject 0.3 mg into the muscle once as needed (allergic reaction).         . Fluticasone-Salmeterol (ADVAIR) 500-50 MCG/DOSE AEPB   Inhalation   Inhale 1 puff into the lungs 2 (two) times daily.   60 each   11   . guaiFENesin (MUCINEX) 600 MG 12 hr tablet   Oral   Take 1 tablet (600 mg total) by mouth 2 (two) times daily.   60 tablet   0   . HYDROcodone-acetaminophen (NORCO/VICODIN) 5-325 MG per tablet   Oral   Take 1 tablet by mouth 2 (two) times daily as needed for pain.   10 tablet   0   . ipratropium (ATROVENT) 0.02 % nebulizer solution   Nebulization   Take 2.5 mLs (500 mcg total) by  nebulization 4 (four) times daily as needed (shortness of breath.).   75 mL   12   . pantoprazole (PROTONIX) 40 MG tablet   Oral   Take 1 tablet (40 mg total) by mouth daily.   30 tablet   11   . predniSONE (DELTASONE) 20 MG tablet   Oral   Take 2 tablets (40 mg total) by mouth daily with breakfast.   2 tablet   0   . traZODone (DESYREL) 25 mg TABS   Oral   Take 25 mg by mouth every other day.            BP 125/73  Pulse 65  Temp(Src) 98.5 F (36.9 C) (Oral)  Resp 16  SpO2 96%  Physical Exam  Nursing note and vitals reviewed. Constitutional: She is oriented to person, place, and time. She appears well-developed and well-nourished. No distress.  HENT:  Head: Normocephalic and atraumatic.  Cardiovascular: Normal heart sounds.   Pulmonary/Chest: Breath sounds normal.  Musculoskeletal:  Left wrist: No is deformity. No bruising or obvious swelling. Reported diffuse tenderness in the dorsal aspect of the left wrist and area tender to palpation. Patient able to dorsiflex and flex left wrist with reported discomfort. No tenderness or deformity over the metacarpal bones. Normal perfusion of hand and finger pads.  There is some bruising at the antecubital fossa do to recent in-hospital venipunctures. Entire left upper extremity appears neurovascularly intact.  Neurological: She is alert and oriented to person, place, and time.  Skin: No rash noted. She is not diaphoretic.    ED Course  Procedures (including critical care time)  Labs Reviewed - No data to display Dg Wrist Complete Left  09/13/2012   *RADIOLOGY REPORT*  Clinical Data: Larey Seat getting out of bed injuring left wrist, pain  LEFT WRIST - COMPLETE 3+ VIEW  Comparison: 06/30/2012  Findings: Osseous mineralization normal for technique. Joint spaces preserved. Stable small degenerative cyst within proximal pole scaphoid. No definite acute fracture, dislocation or additional bone destruction.  IMPRESSION: No acute osseous  abnormalities.   Original Report Authenticated By: Ulyses Southward, M.D.     1. Left wrist sprain, initial encounter  MDM  Treated with a wrist splint and Celebrex. Patient already has a chronic narcotic prescription from her primary care provider. Supportive care including rehabilitation exercises and red flags that should prompt return to medical attention discussed with patient and provided in writing.        Sharin Grave, MD 09/13/12 1326

## 2012-09-15 NOTE — ED Provider Notes (Signed)
I saw and evaluated the patient, reviewed the resident's note and I agree with the findings and plan.  Please see my separate note regarding my evaluation  I have seen and interpreted ECG and agree with ECG interpretation by resident  Vida Roller, MD 09/15/12 [id number]

## 2012-09-19 NOTE — Discharge Summary (Signed)
Internal Medicine Teaching Service Attending Note Date: 09/19/2012  Patient name: Maria Nelson  Medical record number: [id number]  Date of birth: 1956-02-01    I supervised the treatment of this patient throughout her stay, and discussed the discharge plan with my resident team on the day of discharge. I agree with the discharge documentation and disposition.  Thank you for working with me on this patient's care.   Thanks Aletta Edouard 09/19/2012, 6:59 AM

## 2012-09-20 ENCOUNTER — Encounter: Payer: Self-pay | Admitting: Internal Medicine

## 2012-09-20 ENCOUNTER — Ambulatory Visit (INDEPENDENT_AMBULATORY_CARE_PROVIDER_SITE_OTHER): Payer: Medicaid Other | Admitting: Internal Medicine

## 2012-09-20 VITALS — BP 110/72 | HR 78 | Temp 98.2°F | Ht 60.0 in | Wt 205.6 lb

## 2012-09-20 DIAGNOSIS — J441 Chronic obstructive pulmonary disease with (acute) exacerbation: Secondary | ICD-10-CM

## 2012-09-20 DIAGNOSIS — M25532 Pain in left wrist: Secondary | ICD-10-CM

## 2012-09-20 DIAGNOSIS — J449 Chronic obstructive pulmonary disease, unspecified: Secondary | ICD-10-CM

## 2012-09-20 DIAGNOSIS — J45901 Unspecified asthma with (acute) exacerbation: Secondary | ICD-10-CM

## 2012-09-20 DIAGNOSIS — M25539 Pain in unspecified wrist: Secondary | ICD-10-CM

## 2012-09-20 DIAGNOSIS — E876 Hypokalemia: Secondary | ICD-10-CM

## 2012-09-20 LAB — BASIC METABOLIC PANEL
BUN: 16 mg/dL (ref 6–23)
CO2: 26 mEq/L (ref 19–32)
Calcium: 9.1 mg/dL (ref 8.4–10.5)
Creat: 1.05 mg/dL (ref 0.50–1.10)
Glucose, Bld: 85 mg/dL (ref 70–99)
Sodium: 143 mEq/L (ref 135–145)

## 2012-09-20 MED ORDER — NAPROXEN 500 MG PO TABS: 500.0000 mg | ORAL_TABLET | Freq: Two times a day (BID) | ORAL | Status: DC

## 2012-09-20 MED ORDER — TRAMADOL HCL 50 MG PO TABS: 50.0000 mg | ORAL_TABLET | Freq: Four times a day (QID) | ORAL | Status: DC | PRN

## 2012-09-20 NOTE — Progress Notes (Signed)
HPI The patient is a 57 y.o. female with a history of COPD, PUD, stress incontinence, presenting for a follow-up visit.  The patient was hospitalized 5/23-5/26 with a COPD exacerbation.  The patient has completed the prednisone and doxycycline, and she states that her breathing is back to normal.  She is now using albuterol once every 1-2 days.  The patient notes a 1-year history of left wrist pain, which came on gradually.  Symptoms have worsened over the last 6 months.  She notes similar pain in her right wrist "years ago".  The patient is currently treating her pain with a wrist brace, and taking hydrocodone and celebrex, which does not help the pain.  She was seen in Urgent Care 1 week ago after acutely worsening the pain by putting pressure on it while getting out of bed.  X-ray showed no fracture.  She notes no fevers, erythema, or discharge from the area.  ROS: General: no fevers, chills, changes in weight, changes in appetite Skin: no rash HEENT: no blurry vision, hearing changes, sore throat Pulm: no dyspnea, coughing, wheezing CV: no chest pain, palpitations, shortness of breath Abd: no abdominal pain, nausea/vomiting, diarrhea/constipation GU: no dysuria, hematuria, polyuria Ext: no arthralgias, myalgias Neuro: no weakness, numbness, or tingling  Filed Vitals:   09/20/12 1509  BP: 110/72  Pulse: 78  Temp: 98.2 F (36.8 C)    PEX General: alert, cooperative, and in no apparent distress HEENT: pupils equal round and reactive to light, vision grossly intact, oropharynx clear and non-erythematous  Neck: supple, no lymphadenopathy Lungs: clear to ascultation bilaterally, normal work of respiration, no wheezes, rales, ronchi Heart: regular rate and rhythm, no murmurs, gallops, or rubs Abdomen: soft, non-tender, non-distended, normal bowel sounds Extremities:  Left wrist with tenderness to palpation around the palmer carpal ligament, with no pain at the radial  styloid Neurologic: alert & oriented X3, cranial nerves II-XII intact, strength grossly intact, sensation intact to light touch, no numbness or tingling in the palm or fingers  Current Outpatient Prescriptions on File Prior to Visit  Medication Sig Dispense Refill  . albuterol (PROVENTIL HFA;VENTOLIN HFA) 108 (90 BASE) MCG/ACT inhaler Inhale 2 puffs into the lungs every 6 (six) hours as needed for wheezing.      [name] albuterol (PROVENTIL) (2.5 MG/3ML) 0.083% nebulizer solution Take 3 mLs (2.5 mg total) by nebulization every 6 (six) hours as needed for wheezing.  75 mL  12  . albuterol (PROVENTIL) (5 MG/ML) 0.5% nebulizer solution Take 0.5 mLs (2.5 mg total) by nebulization every 6 (six) hours.  20 mL  12  . celecoxib (CELEBREX) 100 MG capsule Take 1 capsule (100 mg total) by mouth 2 (two) times daily.  20 capsule  0  . desloratadine (CLARINEX) 5 MG tablet Take 5 mg by mouth daily.      [name] doxycycline (VIBRA-TABS) 100 MG tablet Take 1 tablet (100 mg total) by mouth every 12 (twelve) hours.  5 tablet  0  . EPINEPHrine (EPIPEN) 0.3 mg/0.3 mL DEVI Inject 0.3 mg into the muscle once as needed (allergic reaction).      . Fluticasone-Salmeterol (ADVAIR) 500-50 MCG/DOSE AEPB Inhale 1 puff into the lungs 2 (two) times daily.  60 each  11  . guaiFENesin (MUCINEX) 600 MG 12 hr tablet Take 1 tablet (600 mg total) by mouth 2 (two) times daily.  60 tablet  0  . HYDROcodone-acetaminophen (NORCO/VICODIN) 5-325 MG per tablet Take 1 tablet by mouth 2 (two) times daily as needed for pain.  10 tablet  0  . ipratropium (ATROVENT) 0.02 % nebulizer solution Take 2.5 mLs (500 mcg total) by nebulization 4 (four) times daily as needed (shortness of breath.).  75 mL  12  . pantoprazole (PROTONIX) 40 MG tablet Take 1 tablet (40 mg total) by mouth daily.  30 tablet  11  . predniSONE (DELTASONE) 20 MG tablet Take 2 tablets (40 mg total) by mouth daily with breakfast.  2 tablet  0  . traZODone (DESYREL) 25 mg TABS Take 25 mg by  mouth every other day.        No current facility-administered medications on file prior to visit.    Assessment/Plan

## 2012-09-20 NOTE — Assessment & Plan Note (Addendum)
The patient was recently hospitalized for a COPD exacerbation.  Symptoms appear to have resolved completely, and the patient has completed her doxycycline and prednisone.  She continues to use albuterol once every 1-2 days.

## 2012-09-20 NOTE — Assessment & Plan Note (Signed)
The patient was found to have hypokalemia during her hospitalization, likely due to albuterol usage. -repeat BMET today

## 2012-09-20 NOTE — Assessment & Plan Note (Addendum)
The patient notes left wrist pain, present for the last year, but worsened after weight bearing while getting out of bed 1 week ago, with unremarkable wrist x-ray performed at the time.  I agree that this likely represents a sprain or strain of the left wrist.  The patient has a history of carpal tunnel syndrome and radial styloid tenosynovitis, but I find no evidence of either of these on my exam today. -continue wrist splint -continue hydrocodone-acetaminophen as needed for pain -initially considered naproxen for pain relief, but chart review reveals history of PUD with GI hemorrhage, so we will not prescribe this at this time -will add tramadol as needed for additional pain control -patient notes irritation of the wrist with ice, though this can be considered if tolerable -the patient asks about referral to a specialist.  If no improvement in an additional 1-2 weeks, will refer to sports medicine

## 2012-09-20 NOTE — Patient Instructions (Addendum)
General Instructions: Your wrist pain is likely due to a sprain, which will heal with time -continue to wear your wrist splint -continue to take Hydrocodone-acetaminophen as needed -you can also take tramadol as needed for pain  Please return for a follow-up visit in 4 weeks.   Treatment Goals:  Goals (1 Years of Data) as of 09/20/12   None      Progress Toward Treatment Goals:    Self Care Goals & Plans:       Care Management & Community Referrals:  Referral 09/20/2012  Referrals made to community resources none

## 2012-09-21 NOTE — Progress Notes (Signed)
INTERNAL MEDICINE TEACHING ATTENDING ADDENDUM: I discussed this case with Dr. Brown at the time of patient visit. I have read the documentation and I agree with the plan of care. Please see the resident note for details of management.   

## 2012-10-04 ENCOUNTER — Telehealth: Payer: Self-pay | Admitting: *Deleted

## 2012-10-12 NOTE — Telephone Encounter (Signed)
done

## 2012-10-16 ENCOUNTER — Ambulatory Visit: Payer: Medicaid Other | Admitting: Internal Medicine

## 2012-10-26 ENCOUNTER — Encounter: Payer: Self-pay | Admitting: Internal Medicine

## 2012-10-26 ENCOUNTER — Ambulatory Visit (INDEPENDENT_AMBULATORY_CARE_PROVIDER_SITE_OTHER): Payer: Medicaid Other | Admitting: Internal Medicine

## 2012-10-26 VITALS — BP 129/87 | HR 65 | Temp 98.7°F | Ht 60.0 in | Wt 209.6 lb

## 2012-10-26 DIAGNOSIS — T783XXA Angioneurotic edema, initial encounter: Secondary | ICD-10-CM

## 2012-10-26 DIAGNOSIS — M25539 Pain in unspecified wrist: Secondary | ICD-10-CM

## 2012-10-26 DIAGNOSIS — M25532 Pain in left wrist: Secondary | ICD-10-CM

## 2012-10-26 MED ORDER — FLUTICASONE-SALMETEROL 500-50 MCG/DOSE IN AEPB: 1.0000 | INHALATION_SPRAY | Freq: Two times a day (BID) | RESPIRATORY_TRACT | Status: DC

## 2012-10-26 MED ORDER — EPINEPHRINE 0.3 MG/0.3ML IJ SOAJ: 0.3000 mg | Freq: Once | INTRAMUSCULAR | Status: AC | PRN

## 2012-10-26 NOTE — Assessment & Plan Note (Signed)
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  Symptoms had improved, and her last episode prior to her most recent episode was about 1 year ago.  She was sent to [facility] in 2011-2012 for allergy testing, but never completed this testing.  The patient has been managing symptoms with hydroxyzine, and has an epi-pen for emergencies.  We recommend allergy testing to further determine the etiology of these symptoms.  The patient is more concerned about her wrist pain today, so we can defer this to a future visit.  The patient is instructed to seek medical attention if symptoms recur. -epipen prescription renewed -will refer to allergy/immunology at next visit if patient amenable

## 2012-10-26 NOTE — Patient Instructions (Addendum)
General Instructions: If you again experience swelling of your lips, tongue, or airway, please report to the ED immediately for evaluation.  We have refilled your epipen today.  For your left wrist pain, we are referring you to Sports Medicine.  Please return for a follow-up visit in 3-4 months.   Treatment Goals:  Goals (1 Years of Data) as of 10/26/12   None      Progress Toward Treatment Goals:    Self Care Goals & Plans:       Care Management & Community Referrals:  Referral 10/26/2012  Referrals made to community resources none

## 2012-10-26 NOTE — Assessment & Plan Note (Addendum)
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  Symptoms had improved, and her last episode prior to her most recent episode was about 1 year ago.  She was sent to [facility] in 2011-2012 for allergy testing, but never completed this testing.  The patient has been managing symptoms with hydroxyzine, and has an epi-pen for emergencies.  We recommend allergy testing to further determine the etiology of these symptoms.  The patient is more concerned about her wrist pain today, so we can defer this to a future visit.  The patient is instructed to seek medical attention if symptoms recur. -epipen prescription renewed -will refer to allergy/immunology at next visit if patient amenable

## 2012-10-26 NOTE — Assessment & Plan Note (Addendum)
The patient continues to note left wrist pain, as noted at our last visit, following a weight-bearing injury, though to represent a muscular strain.  However, symptoms have not improved with conservative measures.  Hydrocodone relieves pain but causes drowsiness, and naproxen has been avoided due to her history of PUD and GI bleed.  The patient has a history of radial styloid tenosynovitis, but currently has no pain directly at the radial styloid.  X-ray shows no bony abnormality.  Will refer to Sports Medicine for further evaluation/treatment. -referral sent to Sports Medicine

## 2012-10-26 NOTE — Progress Notes (Signed)
HPI The patient is a 57 y.o. female with a history of COPD, PUD, OSA, presenting for a follow-up visit.  The patient continues to note left wrist pain, as described at our last visit.  The patient has had some relief with using a wrist brace, wrist stretching exercises, and squeezing a stress ball.  She tried taking hydrocodone, but noted side effects of drowsiness, so she stopped taking this.  The patient notes that the pain is at its worst at night, or if the area is accidentally "bumped" on an object.  The patient notes that last week, she had an episode when her tongue swelled after eating a hot dog with chili.  She notes no lip swelling or difficulty breathing.  The patient took a benadryl, with resolution of symptoms.  The patient notes no new medications, and can identify no other trigger for these symptoms.  She notes a history of similar symptoms dating back several years, though she notes they have been more well-controlled in recent years.  She notes presenting to [facility] for evaluation, but due to other health issues never completed her evaluation.  The patient has an epipen at home, which she brought to her appointment today, but which has expired.  ROS: General: no fevers, chills, changes in weight, changes in appetite Skin: no rash HEENT: no blurry vision, hearing changes, sore throat Pulm: no dyspnea, coughing, wheezing CV: no chest pain, palpitations, shortness of breath Abd: no abdominal pain, nausea/vomiting, diarrhea/constipation GU: no dysuria, hematuria, polyuria Ext: see HPI Neuro: no weakness, numbness, or tingling  Filed Vitals:   10/26/12 1314  BP: 129/87  Pulse: 65  Temp: 98.7 F (37.1 C)    PEX General: alert, cooperative, and in no apparent distress HEENT: pupils equal round and reactive to light, vision grossly intact, oropharynx clear and non-erythematous  Neck: supple, no lymphadenopathy Lungs: clear to ascultation bilaterally, normal work of  respiration, no wheezes, rales, ronchi Heart: regular rate and rhythm, no murmurs, gallops, or rubs Abdomen: soft, non-tender, non-distended, normal bowel sounds Extremities: Left wrist with mild tenderness to palpation of the 1st MCP, and of the muscles of the left lateral forearm, though no tenderness at the radial styloid Neurologic: alert & oriented X3, cranial nerves II-XII intact, strength grossly intact though limited by pain, sensation intact to light touch  Current Outpatient Prescriptions on File Prior to Visit  Medication Sig Dispense Refill  . albuterol (PROVENTIL HFA;VENTOLIN HFA) 108 (90 BASE) MCG/ACT inhaler Inhale 2 puffs into the lungs every 6 (six) hours as needed for wheezing.      [name] albuterol (PROVENTIL) (2.5 MG/3ML) 0.083% nebulizer solution Take 3 mLs (2.5 mg total) by nebulization every 6 (six) hours as needed for wheezing.  75 mL  12  . albuterol (PROVENTIL) (5 MG/ML) 0.5% nebulizer solution Take 0.5 mLs (2.5 mg total) by nebulization every 6 (six) hours.  20 mL  12  . desloratadine (CLARINEX) 5 MG tablet Take 5 mg by mouth daily.      [name] EPINEPHrine (EPIPEN) 0.3 mg/0.3 mL DEVI Inject 0.3 mg into the muscle once as needed (allergic reaction).      . Fluticasone-Salmeterol (ADVAIR) 500-50 MCG/DOSE AEPB Inhale 1 puff into the lungs 2 (two) times daily.  60 each  11  . guaiFENesin (MUCINEX) 600 MG 12 hr tablet Take 1 tablet (600 mg total) by mouth 2 (two) times daily.  60 tablet  0  . HYDROcodone-acetaminophen (NORCO/VICODIN) 5-325 MG per tablet Take 1 tablet by mouth 2 (  two) times daily as needed for pain.  10 tablet  0  . ipratropium (ATROVENT) 0.02 % nebulizer solution Take 2.5 mLs (500 mcg total) by nebulization 4 (four) times daily as needed (shortness of breath.).  75 mL  12  . naproxen (NAPROSYN) 500 MG tablet Take 1 tablet (500 mg total) by mouth 2 (two) times daily with a meal.  40 tablet  0  . pantoprazole (PROTONIX) 40 MG tablet Take 1 tablet (40 mg total) by mouth  daily.  30 tablet  11  . traMADol (ULTRAM) 50 MG tablet Take 1 tablet (50 mg total) by mouth every 6 (six) hours as needed for pain.  20 tablet  0  . traZODone (DESYREL) 25 mg TABS Take 25 mg by mouth every other day.        No current facility-administered medications on file prior to visit.    Assessment/Plan

## 2012-10-27 NOTE — Progress Notes (Signed)
Case discussed with Dr. Brown at the time of the visit.  We reviewed the resident's history and exam and pertinent patient test results.  I agree with the assessment, diagnosis, and plan of care documented in the resident's note. 

## 2012-11-15 ENCOUNTER — Ambulatory Visit (INDEPENDENT_AMBULATORY_CARE_PROVIDER_SITE_OTHER): Payer: Medicaid Other | Admitting: Emergency Medicine

## 2012-11-15 VITALS — BP 128/70 | Ht 60.0 in | Wt 200.0 lb

## 2012-11-15 DIAGNOSIS — M25539 Pain in unspecified wrist: Secondary | ICD-10-CM

## 2012-11-15 DIAGNOSIS — M654 Radial styloid tenosynovitis [de Quervain]: Secondary | ICD-10-CM

## 2012-11-15 DIAGNOSIS — M25532 Pain in left wrist: Secondary | ICD-10-CM

## 2012-11-15 NOTE — Assessment & Plan Note (Signed)
The patient had steroid injection into the first extensor compartment to the office today under ultrasound guidance. She was placed in a thumb spica splint. She will followup in 4-6 weeks.

## 2012-11-15 NOTE — Progress Notes (Signed)
  Subjective:    Patient ID: Maria Nelson, female    DOB: 21-Mar-1956, 57 y.o.   MRN: [id number]  HPI This is a 57 year old female who presents to the sports medicine clinic today with a complaint of left wrist pain. The pain onset over 6 months ago, progressively worsening on the thumb side of her left wrist. Is worse with heavy lifting. Worse with movement. She has been wearing an over-the-counter volar wrist splint with no relief. She's tried anti-inflammatory medication with minimal to no relief. She has seen her primary care doctor and had x-rays of the left wrist which were negative. She was referred to the sports medicine clinic by Dr. Manson Passey. The pain radiates up her left forearm on the radial side. She denies any significant swelling  Past Medical History  Diagnosis Date  . COPD (chronic obstructive pulmonary disease)     secondary to tobacco use // No PFTs on file  . Gastrointestinal hemorrhage     secondary to PUD on March 2008  . Peptic ulcer disease     +H. pylori (antigen)- Dr. Juanda Chance- treated  . ETOH abuse     Pt stopped in 3/08  . Tobacco abuse     quit in 2010  . OSA on CPAP     noncompliant with CPAP (2/2 it being depressing)  . Urinary incontinence   . Carpal tunnel syndrome 06/23/2011    Patient notes history of bilateral carpal tunnel syndrome.  Now has symptoms of right hand carpal tunnel.   . 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 STRESS INCONTINENCE 01/24/2007   Past Surgical History  Procedure Laterality Date  . Tubal ligation    . Mandible surgery      due to trauma   Social history reviewed and on chart.  She has allergies to aspirin and bees  Review of Systems Review of systems as per history of present illness otherwise negative    Objective:   Physical Exam BP 128/70  Ht 5' (1.524 m)  Wt 200 lb (90.719 kg)  BMI 39.06 kg/m2  This is a 57 year old female in no acute distress awake alert and oriented  Left Wrist: Inspection normal with no visible  erythema or swelling. ROM smooth and normal with good flexion and extension and radial deviation: however there is marked pain with ulnar deviation. Palpation is normal over metacarpals, navicular, lunate, and TFCC; tendons without tenderness/ swelling Tenderness is present into the region of the anatomic snuff box. Strength 5/5 in all directions Positive Finkelstein, negative tinel's and phalens.  Pulses intact in bilateral upper extremities  Sensation is grossly intact in bilateral upper extremities  Examination of the right wrist reveals no abnormalities  Muscular skeletal ultrasound of the extensor compartments of the left wrist was performed.  Long and short axis views of the first extensor compartment revealed moderate amount of fluid within the tendon sheath consistent with  Tenosynovitis      Assessment & Plan:   Procedure note Using ultrasound guidance 0.5 cc of Xylocaine and 0.5 cc of Depo-Medrol were injected into the first extensor compartment tendon sheath with good visualization. The procedure was performed using sterile technique. Informed consent was obtained from the patient prior to beginning the procedure. The patient tolerated the procedure well. A 5/8 inch needle was used and a Band-Aid was applied after removal of the needle.

## 2012-11-15 NOTE — Patient Instructions (Addendum)
Tendinitis and Tenosynovitis  Tendinitis is inflammation of the tendon. Tenosynovitis is inflammation of the lining around the tendon (tendon sheath). These painful conditions often occur at once. Tendons attach muscle to bone. To move a limb, force from the muscle moves through the tendon, to the bone. These conditions often cause increased pain when moving. Tendinitis may be caused by a small or partial tear in the tendon.  SYMPTOMS   Pain, tenderness, redness, bruising, or swelling at the injury.  Loss of normal joint movement.  Pain that gets worse with use of the muscle and joint attached to the tendon.  Weakness in the tendon, caused by calcium build up that may occur with tendinitis.  Your symptoms are in the tendons that help to move your thumb (extensor tendons of thumb)  CAUSES   Sudden strain on a flexed muscle, muscle overuse, sudden increase or change in activity, vigorous activity.  Result of a direct hit (less common).  Poor muscle action (biomechanics). RISK INCREASES WITH:  Injury (trauma).  Too much exercise.  Sudden change in athletic activity.  Incorrect exercise form or technique.  Poor strength and flexibility.  Not warming-up properly before activity.  Returning to activity before healing is complete. PREVENTION   Warm-up and stretch properly before activity.  Maintain physical fitness:  Joint flexibility.  Muscle strength and endurance.  Fitness that increases heart rate.  Learn and use proper exercise techniques.  Use rehabilitation exercises to strengthen weak muscles and tendons.  Ice the tendon after activity, to reduce recurring inflammation.  Wear proper fitting protective equipment for specific tendons, when indicated. PROGNOSIS  When treated properly, can be cured in 6 to 8 weeks. Recovery may take longer, depending on degree of injury.  RELATED COMPLICATIONS   Re-injury or recurring symptoms.  Permanent weakness or joint  stiffness, if injury is severe and recovery is not completed.  Delayed healing, if sports are started before healing is complete.  Tearing apart (rupture) of the inflamed tendon. Tendinitis means the tendon is injured and must recover. TREATMENT  Treatment first involves ice, medicine, and rest from aggravating activities. This reduces pain and inflammation. Modifying your activity may be considered to prevent recurring injury. A brace, elastic bandage wrap, splint, cast, or sling may be prescribed to protect the joint for a short period. After that period, strengthening and stretching exercise may help to regain strength and full range of motion. If the condition persists, despite non-surgical treatment, surgery may be recommended to remove the inflamed tendon lining. Corticosteroid injections may be given to reduce inflammation. However, these injections may weaken the tendon and increase your risk for tendon rupture. MEDICATION   If pain medicine is needed, nonsteroidal anti-inflammatory medicines (aspirin and ibuprofen), or other minor pain relievers (acetaminophen), are often recommended.  Do not take pain medicine for 7 days before surgery.  Prescription pain relievers are usually prescribed only after surgery. Use only as directed and only as much as you need.  Ointments applied to the skin may be helpful.  Corticosteroid injections may be given to reduce inflammation. However, this may increase your risk of a tendon rupture. HEAT AND COLD  Cold treatment (icing) relieves pain and reduces inflammation. Cold treatment should be applied for 10 to 15 minutes every 2 to 3 hours, and immediately after activity that aggravates your symptoms. Use ice packs or an ice massage.  Heat treatment may be used before performing stretching and strengthening activities prescribed by your caregiver, physical therapist, or athletic trainer. Use  a heat pack or a warm water soak. SEEK MEDICAL CARE IF:    Symptoms get worse or do not improve, despite treatment.  Pain becomes too much to tolerate.  You develop numbness or tingling.  Toes become cold, or toenails become blue, gray, or dark colored.  New, unexplained symptoms develop. (Drugs used in treatment may produce side effects.) Document Released: 04/05/2005 Document Revised: 06/28/2011 Document Reviewed: 07/18/2008 [facility] Patient Information 2014 [place], [place].  You should use the splint to rest the tendon and follow up in 4-6 weeks for a recheck.

## 2012-12-20 ENCOUNTER — Encounter: Payer: Self-pay | Admitting: Internal Medicine

## 2012-12-27 ENCOUNTER — Ambulatory Visit (INDEPENDENT_AMBULATORY_CARE_PROVIDER_SITE_OTHER): Payer: Medicaid Other | Admitting: Emergency Medicine

## 2012-12-27 ENCOUNTER — Encounter: Payer: Self-pay | Admitting: Emergency Medicine

## 2012-12-27 VITALS — BP 123/84 | HR 75 | Ht 60.0 in | Wt 200.0 lb

## 2012-12-27 DIAGNOSIS — M19049 Primary osteoarthritis, unspecified hand: Secondary | ICD-10-CM | POA: Insufficient documentation

## 2012-12-27 MED ORDER — DICLOFENAC SODIUM 1 % TD GEL: 1.0000 "application " | Freq: Four times a day (QID) | TRANSDERMAL | Status: DC

## 2012-12-27 NOTE — Progress Notes (Signed)
  Subjective:    Patient ID: Maria Nelson, female    DOB: January 16, 1956, 57 y.o.   MRN: [id number]  HPI 57 year old female who presents with a complaint of right wrist hand for approximately 2 weeks. She denies any significant injury. She does report that she leans on her right hand while sitting on the couch and this seems to worsen symptoms. No radiation of symptoms. Pain is worse on the dorsal aspect of her right hand over the region of the second and third metacarpal bones. She has had similar pain in the past. Taking ibuprofen with moderate relief.   She was seen in the office approximately one month ago for left de Quervain's tenosynovitis, which was injected at that time. She reports the symptoms being completely gone and feeling much better.  Past Medical History  Diagnosis Date  . COPD (chronic obstructive pulmonary disease)     secondary to tobacco use // No PFTs on file  . Gastrointestinal hemorrhage     secondary to PUD on March 2008  . Peptic ulcer disease     +H. pylori (antigen)- Dr. Juanda Chance- treated  . ETOH abuse     Pt stopped in 3/08  . Tobacco abuse     quit in 2010  . OSA on CPAP     noncompliant with CPAP (2/2 it being depressing)  . Urinary incontinence   . Carpal tunnel syndrome 06/23/2011    Patient notes history of bilateral carpal tunnel syndrome.  Now has symptoms of right hand carpal tunnel.   . 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 STRESS INCONTINENCE 01/24/2007     Past Surgical History  Procedure Laterality Date  . Tubal ligation    . Mandible surgery      due to trauma   Allergies  Allergen Reactions  . Bee Venom Anaphylaxis  . Aspirin     REACTION: nausea    Review of Systems As per history of present illness otherwise negative.    Objective:   Physical Exam  BP 123/84  Pulse 75  Ht 5' (1.524 m)  Wt 200 lb (90.719 kg)  BMI 39.06 kg/m2  is a well-developed well-nourished 57 year old [place] American female awake alert oriented in no acute  distress   Right Hand/Wrist: Inspection normal with no visible erythema or swelling. ROM smooth and normal with good flexion and extension and ulnar/radial deviation that is symmetrical with opposite wrist. Palpation is normal over scaphoid, lunate, and TFCC; tendons without tenderness/ swelling, tenderness present over 2nd and 3rd metacarpal mildly.  No effusion/ecchymosis. Strength 5/5 in all directions without pain. Negative Finkelstein, tinel's [place] with significant callus  MDM: Review prior x-rays of right hand from December 2013 which were negative for severe OA.

## 2012-12-27 NOTE — Assessment & Plan Note (Signed)
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 She has a history of peptic ulcer disease advised topical Voltaren and to decrease the by mouth ibuprofen. She'll followup as needed

## 2013-01-31 ENCOUNTER — Ambulatory Visit (INDEPENDENT_AMBULATORY_CARE_PROVIDER_SITE_OTHER): Payer: Medicaid Other | Admitting: Internal Medicine

## 2013-01-31 ENCOUNTER — Encounter: Payer: Self-pay | Admitting: Internal Medicine

## 2013-01-31 VITALS — BP 130/95 | HR 67 | Temp 97.2°F | Ht 60.0 in | Wt 201.7 lb

## 2013-01-31 DIAGNOSIS — J441 Chronic obstructive pulmonary disease with (acute) exacerbation: Secondary | ICD-10-CM | POA: Insufficient documentation

## 2013-01-31 DIAGNOSIS — T783XXA Angioneurotic edema, initial encounter: Secondary | ICD-10-CM

## 2013-01-31 DIAGNOSIS — L259 Unspecified contact dermatitis, unspecified cause: Secondary | ICD-10-CM

## 2013-01-31 DIAGNOSIS — L309 Dermatitis, unspecified: Secondary | ICD-10-CM

## 2013-01-31 MED ORDER — AZITHROMYCIN 250 MG PO TABS: ORAL_TABLET | ORAL | Status: AC

## 2013-01-31 MED ORDER — TRIAMCINOLONE ACETONIDE 0.025 % EX CREA: TOPICAL_CREAM | Freq: Two times a day (BID) | CUTANEOUS | Status: DC

## 2013-01-31 MED ORDER — PREDNISONE 10 MG PO TABS: ORAL_TABLET | ORAL | Status: DC

## 2013-01-31 NOTE — Progress Notes (Signed)
HPI The patient is a 57 y.o. female with a history of COPD, PUD, presenting with shortness of breath.  The patient notes a 3-day history of dyspnea, which she believes was triggered by breathing incense at a family member's house.  She also notes symptoms of increased frequency of cough, compared to baseline.  She has been using her albuterol nebulizer 3 times/day, with some relief. The patient notes no fevers, chills, congestion, runny nose.  The patient has a history of idiopathic angioedema.  She notes taking hydroxyzine for right-sided tongue swelling last night, which resolved the episode.  The patient is drowsy during our visit today, which she says is typical for her after taking this medication.  The patient notes a multiple month history of dry skin on bilateral palms.  ROS: General: no fevers, chills, changes in weight, changes in appetite Skin: no rash HEENT: no blurry vision, hearing changes, sore throat Pulm: see HPI CV: no chest pain, palpitations, shortness of breath Abd: no abdominal pain, nausea/vomiting, diarrhea/constipation GU: no dysuria, hematuria, polyuria Ext: no arthralgias, myalgias Neuro: no weakness, numbness, or tingling  Filed Vitals:   01/31/13 1550  BP: 130/95  Pulse: 67  Temp: 97.2 F (36.2 C)    PEX General: alert, cooperative, and in no apparent distress HEENT: pupils equal round and reactive to light, vision grossly intact, oropharynx clear and non-erythematous, tongue without edema Neck: supple, no lymphadenopathy Lungs: normal work of respiration, bilateral mild end-expiratory wheeze heard in all lung fields Heart: regular rate and rhythm, no murmurs, gallops, or rubs Abdomen: soft, non-tender, non-distended, normal bowel sounds Extremities: bilateral hands with dry, cracked skin Neurologic: alert & oriented X3, cranial nerves II-XII intact, strength grossly intact, sensation intact to light touch  Current Outpatient Prescriptions on File  Prior to Visit  Medication Sig Dispense Refill  . albuterol (PROVENTIL HFA;VENTOLIN HFA) 108 (90 BASE) MCG/ACT inhaler Inhale 2 puffs into the lungs every 6 (six) hours as needed for wheezing.      [name] albuterol (PROVENTIL) (2.5 MG/3ML) 0.083% nebulizer solution Take 3 mLs (2.5 mg total) by nebulization every 6 (six) hours as needed for wheezing.  75 mL  12  . albuterol (PROVENTIL) (5 MG/ML) 0.5% nebulizer solution Take 0.5 mLs (2.5 mg total) by nebulization every 6 (six) hours.  20 mL  12  . desloratadine (CLARINEX) 5 MG tablet Take 5 mg by mouth daily.      . diclofenac sodium (VOLTAREN) 1 % GEL Apply 1 application topically 4 (four) times daily.  1 Tube  1  . EPINEPHrine (EPIPEN) 0.3 mg/0.3 mL SOAJ Inject 0.3 mLs (0.3 mg total) into the skin once as needed.  1 Device  6  . Fluticasone-Salmeterol (ADVAIR) 500-50 MCG/DOSE AEPB Inhale 1 puff into the lungs 2 (two) times daily.  180 each  3  . guaiFENesin (MUCINEX) 600 MG 12 hr tablet Take 1 tablet (600 mg total) by mouth 2 (two) times daily.  60 tablet  0  . HYDROcodone-acetaminophen (NORCO/VICODIN) 5-325 MG per tablet Take 1 tablet by mouth 2 (two) times daily as needed for pain.  10 tablet  0  . ipratropium (ATROVENT) 0.02 % nebulizer solution Take 2.5 mLs (500 mcg total) by nebulization 4 (four) times daily as needed (shortness of breath.).  75 mL  12  . naproxen (NAPROSYN) 500 MG tablet Take 1 tablet (500 mg total) by mouth 2 (two) times daily with a meal.  40 tablet  0  . pantoprazole (PROTONIX) 40 MG tablet Take 1  tablet (40 mg total) by mouth daily.  30 tablet  11  . traMADol (ULTRAM) 50 MG tablet Take 1 tablet (50 mg total) by mouth every 6 (six) hours as needed for pain.  20 tablet  0  . traZODone (DESYREL) 25 mg TABS Take 25 mg by mouth every other day.        No current facility-administered medications on file prior to visit.    Assessment/Plan

## 2013-01-31 NOTE — Assessment & Plan Note (Signed)
>>  ASSESSMENT AND PLAN FOR URTICARIA/ANGIOEDEMA WRITTEN ON 01/31/2013  4:50 PM BY Linward Headland, MD  The patient has a history of idiopathic angioedema, with no clear cause identified. She was previously seen at [facility], and referred for allergy testing, but never completed his testing. For now, her current episode that she reports last night appears to have resolved. -Prednisone taper for COPD exacerbation -Will again encourage allergy testing at [facility] when symptoms improve

## 2013-01-31 NOTE — Assessment & Plan Note (Signed)
The dry skin on the patient's hand likely represents eczema -Treat with triamcinolone cream twice a day for 2-4 weeks

## 2013-01-31 NOTE — Patient Instructions (Signed)
General Instructions: Your difficulty breathing is due to a COPD exacerbation -take Prednisone on the following schedule: --Day 1, take 4 tablets once per day --Day 2, take 4 tablets once per day --Day 3, take 3 tablets once per day --Day 4, take 3 tablets once per day --Day 5, take 2 tablets once per day --Day 6, take 2 tablets once per day --Day 7, take 1 tablets once per day --Day 8, take 1 tablets once per day -take Azithromycin, an antibiotic, 2 tablets on the first day, then 1 tablet per day for an additional 4 days  The dry skin on your hands is due to eczema.  Apply Triamcinolone cream to your palms twice per day for 2-4 weeks, then STOP.  Please return for a follow-up visit in 2 weeks.   Treatment Goals:  Goals (1 Years of Data) as of 01/31/13   None      Progress Toward Treatment Goals:  No flowsheet data found.  Self Care Goals & Plans:  No flowsheet data found.  No flowsheet data found.   Care Management & Community Referrals:  Referral 01/31/2013  Referrals made to community resources none

## 2013-01-31 NOTE — Assessment & Plan Note (Signed)
The patient presents with cough and dyspnea (2 cardinal criteria) with wheezes heard throughout lung fields. Symptoms are consistent with acute COPD exacerbation.  -Start an eight-day prednisone taper from 40 mg (40x2, 30x2, 20x2, 10x2) -Start azithromycin x5 days -Continue nebulizer when necessary

## 2013-01-31 NOTE — Assessment & Plan Note (Signed)
The patient has a history of idiopathic angioedema, with no clear cause identified. She was previously seen at [facility], and referred for allergy testing, but never completed his testing. For now, her current episode that she reports last night appears to have resolved. -Prednisone taper for COPD exacerbation -Will again encourage allergy testing at [facility] when symptoms improve

## 2013-02-01 NOTE — Progress Notes (Signed)
Case discussed with Dr. Brown at the time of the visit.  We reviewed the resident's history and exam and pertinent patient test results.  I agree with the assessment, diagnosis, and plan of care documented in the resident's note. 

## 2013-02-14 ENCOUNTER — Ambulatory Visit (INDEPENDENT_AMBULATORY_CARE_PROVIDER_SITE_OTHER): Payer: Medicaid Other | Admitting: Internal Medicine

## 2013-02-14 ENCOUNTER — Encounter: Payer: Self-pay | Admitting: Internal Medicine

## 2013-02-14 VITALS — BP 125/83 | HR 60 | Temp 98.2°F | Ht 60.0 in | Wt 204.5 lb

## 2013-02-14 DIAGNOSIS — Z0000 Encounter for general adult medical examination without abnormal findings: Secondary | ICD-10-CM

## 2013-02-14 DIAGNOSIS — L301 Dyshidrosis [pompholyx]: Secondary | ICD-10-CM

## 2013-02-14 DIAGNOSIS — Z23 Encounter for immunization: Secondary | ICD-10-CM

## 2013-02-14 DIAGNOSIS — B353 Tinea pedis: Secondary | ICD-10-CM | POA: Insufficient documentation

## 2013-02-14 DIAGNOSIS — J441 Chronic obstructive pulmonary disease with (acute) exacerbation: Secondary | ICD-10-CM

## 2013-02-14 DIAGNOSIS — B351 Tinea unguium: Secondary | ICD-10-CM | POA: Insufficient documentation

## 2013-02-14 LAB — HEPATIC FUNCTION PANEL
Albumin: 3.9 g/dL (ref 3.5–5.2)
Total Bilirubin: 0.3 mg/dL (ref 0.3–1.2)
Total Protein: 6.4 g/dL (ref 6.0–8.3)

## 2013-02-14 MED ORDER — TERBINAFINE HCL 250 MG PO TABS: 250.0000 mg | ORAL_TABLET | Freq: Every day | ORAL | Status: DC

## 2013-02-14 MED ORDER — TRIAMCINOLONE ACETONIDE 0.5 % EX CREA: TOPICAL_CREAM | Freq: Two times a day (BID) | CUTANEOUS | Status: DC

## 2013-02-14 NOTE — Assessment & Plan Note (Signed)
Bilateral feet with evidence of tinea pedis. -Terbinafine for onychomycosis will adequately treat this condition

## 2013-02-14 NOTE — Assessment & Plan Note (Signed)
Flu shot today 

## 2013-02-14 NOTE — Assessment & Plan Note (Signed)
The patient's acute exacerbation of COPD has fully resolved

## 2013-02-14 NOTE — Patient Instructions (Addendum)
General Instructions: For the Eczema on your hands and feet, we are increasing the strength of your Triamcinolone cream.  Use twice per day for an additional 2-4 weeks.  For your athlete's foot and toenail fungus, we are starting the medication Terbinafine.  Take 1 tablet once per day for 3 months.  We are checking your liver function now. -call our office around December 10th, and come back for a lab only visit to recheck your liver  Please return for a follow-up visit in about 4 months.   Treatment Goals:  Goals (1 Years of Data) as of 02/14/13   None      Progress Toward Treatment Goals:  No flowsheet data found.  Self Care Goals & Plans:  No flowsheet data found.  No flowsheet data found.   Care Management & Community Referrals:  Referral 02/14/2013  Referrals made to community resources none

## 2013-02-14 NOTE — Assessment & Plan Note (Signed)
The patient notes bilateral onychomycosis, the appearance of which is bothersome to her. We discussed the risks and benefits of Terbinafine for treatment, including the potential for liver damage, and the potential for not curing her onychomycosis.  The patient wishes to proceed with treatment.. -Check LFTs today -Check LFTs again in 6 weeks, around December 10 -Start terbinafine, 250 mg daily, for 3 months.

## 2013-02-14 NOTE — Assessment & Plan Note (Signed)
The patient's symptoms of palmoplantar eczema have only minimally improved on her palms, and today she also reveals eczema on the plantar surfaces of her feet. We have been treating with a group 6 corticosteroid, but given the lack of response to treatment with this topical corticosteroid as well as oral prednisone for her COPD exacerbation, we'll increase to a group 3 corticosteroid. -Prescribed triamcinolone 0.5% cream BID x2-4 weeks to palms and plantar surfaces

## 2013-02-14 NOTE — Progress Notes (Signed)
HPI The patient is a 57 y.o. female with a history of COPD, eczema, PUD, presenting for a 2-week follow-up for COPD.  At the patient's last visit, she was experiencing an acute COPD exacerbation.  After taking prednisone and azithromycin, symptoms have completely resolved.  The patient noted eczema on her bilateral palms at her last visit.  She has been using Triamcinolone cream, but with only minimal improvement.  The patient notes a several-year history of bilateral feet dryness, discoloration of toenail, and bilateral plantar surface "itching" sensation prompting her to scratch her feet with a comb, which causes pain.  The patient has tried soaking her feet in an unknown substance, using lotion, and using vaseline.    ROS: General: no fevers, chills, changes in weight, changes in appetite Skin: no rash HEENT: no blurry vision, hearing changes, sore throat Pulm: no dyspnea, coughing, wheezing CV: no chest pain, palpitations, shortness of breath Abd: no abdominal pain, nausea/vomiting, diarrhea/constipation GU: no dysuria, hematuria, polyuria Ext: no arthralgias, myalgias Neuro: no weakness, numbness, or tingling  Filed Vitals:   02/14/13 1551  BP: 125/83  Pulse: 60  Temp: 98.2 F (36.8 C)    PEX General: alert, cooperative, and in no apparent distress HEENT: pupils equal round and reactive to light, vision grossly intact, oropharynx clear and non-erythematous  Neck: supple Lungs: clear to ascultation bilaterally, normal work of respiration, no wheezes, rales, ronchi Heart: regular rate and rhythm, no murmurs, gallops, or rubs Abdomen: soft, non-tender, non-distended, normal bowel sounds Extremities: Bilateral palms of hands and soles of feet with skin thickening, with few superficial cracks in the skin.  Additionally, skin scaling between toes bilaterally.  Multiple toenails on bilateral feet with yellow discoloration and thickening  Neurologic: alert & oriented X3, cranial  nerves II-XII intact, strength grossly intact, sensation intact to light touch  Current Outpatient Prescriptions on File Prior to Visit  Medication Sig Dispense Refill  . albuterol (PROVENTIL HFA;VENTOLIN HFA) 108 (90 BASE) MCG/ACT inhaler Inhale 2 puffs into the lungs every 6 (six) hours as needed for wheezing.      [name] albuterol (PROVENTIL) (2.5 MG/3ML) 0.083% nebulizer solution Take 3 mLs (2.5 mg total) by nebulization every 6 (six) hours as needed for wheezing.  75 mL  12  . albuterol (PROVENTIL) (5 MG/ML) 0.5% nebulizer solution Take 0.5 mLs (2.5 mg total) by nebulization every 6 (six) hours.  20 mL  12  . desloratadine (CLARINEX) 5 MG tablet Take 5 mg by mouth daily.      . diclofenac sodium (VOLTAREN) 1 % GEL Apply 1 application topically 4 (four) times daily.  1 Tube  1  . EPINEPHrine (EPIPEN) 0.3 mg/0.3 mL SOAJ Inject 0.3 mLs (0.3 mg total) into the skin once as needed.  1 Device  6  . Fluticasone-Salmeterol (ADVAIR) 500-50 MCG/DOSE AEPB Inhale 1 puff into the lungs 2 (two) times daily.  180 each  3  . guaiFENesin (MUCINEX) 600 MG 12 hr tablet Take 1 tablet (600 mg total) by mouth 2 (two) times daily.  60 tablet  0  . ipratropium (ATROVENT) 0.02 % nebulizer solution Take 2.5 mLs (500 mcg total) by nebulization 4 (four) times daily as needed (shortness of breath.).  75 mL  12  . naproxen (NAPROSYN) 500 MG tablet Take 1 tablet (500 mg total) by mouth 2 (two) times daily with a meal.  40 tablet  0  . pantoprazole (PROTONIX) 40 MG tablet Take 1 tablet (40 mg total) by mouth daily.  30 tablet  11  . predniSONE (DELTASONE) 10 MG tablet Take 4 tabs PO daily for 2 days, then 3 tabs/day for 2 days, then 2 tabs/day for 2 days, then 1 tab/day for 2 days  20 tablet  0  . traMADol (ULTRAM) 50 MG tablet Take 1 tablet (50 mg total) by mouth every 6 (six) hours as needed for pain.  20 tablet  0  . traZODone (DESYREL) 25 mg TABS Take 25 mg by mouth every other day.       . triamcinolone (KENALOG) 0.025 %  cream Apply topically 2 (two) times daily. Apply to both palms  80 g  1   No current facility-administered medications on file prior to visit.    Assessment/Plan

## 2013-02-16 NOTE — Progress Notes (Signed)
Case discussed with Dr. Brown at the time of the visit.  We reviewed the resident's history and exam and pertinent patient test results.  I agree with the assessment, diagnosis and plan of care documented in the resident's note. 

## 2013-03-22 ENCOUNTER — Other Ambulatory Visit (INDEPENDENT_AMBULATORY_CARE_PROVIDER_SITE_OTHER): Payer: Medicaid Other

## 2013-03-22 DIAGNOSIS — B351 Tinea unguium: Secondary | ICD-10-CM

## 2013-03-22 LAB — HEPATIC FUNCTION PANEL
Alkaline Phosphatase: 97 U/L (ref 39–117)
Indirect Bilirubin: 0.3 mg/dL (ref 0.0–0.9)
Total Bilirubin: 0.4 mg/dL (ref 0.3–1.2)
Total Protein: 6.6 g/dL (ref 6.0–8.3)

## 2013-04-05 ENCOUNTER — Telehealth: Payer: Self-pay | Admitting: *Deleted

## 2013-04-05 NOTE — Telephone Encounter (Signed)
Thank you :)

## 2013-04-05 NOTE — Telephone Encounter (Signed)
Pt called for lab results done 03/22/13 liver profile. Informed pt results of liver profile were WNL. Reminded pt of appt with Dr Manson Passey 04/16/13. Stanton Kidney Vail Vuncannon RN 04/05/13 9:10AM

## 2013-04-16 ENCOUNTER — Encounter: Payer: Self-pay | Admitting: Internal Medicine

## 2013-04-16 ENCOUNTER — Ambulatory Visit (INDEPENDENT_AMBULATORY_CARE_PROVIDER_SITE_OTHER): Payer: Medicaid Other | Admitting: Internal Medicine

## 2013-04-16 VITALS — BP 123/85 | HR 96 | Temp 98.5°F | Ht 60.0 in | Wt 203.5 lb

## 2013-04-16 DIAGNOSIS — B351 Tinea unguium: Secondary | ICD-10-CM

## 2013-04-16 DIAGNOSIS — M5126 Other intervertebral disc displacement, lumbar region: Secondary | ICD-10-CM | POA: Insufficient documentation

## 2013-04-16 DIAGNOSIS — L301 Dyshidrosis [pompholyx]: Secondary | ICD-10-CM

## 2013-04-16 MED ORDER — MELOXICAM 7.5 MG PO TABS: 7.5000 mg | ORAL_TABLET | Freq: Every day | ORAL | Status: AC

## 2013-04-16 MED ORDER — HYDROCODONE-ACETAMINOPHEN 5-325 MG PO TABS: 1.0000 | ORAL_TABLET | Freq: Three times a day (TID) | ORAL | Status: DC | PRN

## 2013-04-16 NOTE — Patient Instructions (Addendum)
General Instructions: Your pain likely represents a Herniated Disc (see information below).  This condition will improve with time.  To help with the pain, try the following: -take Meloxicam, 1-2 tablets once per day for pain -for breakthrough pain, you may take Hydrocodone-acetaminophen 5-325 mg, 1 tablet up to every 8 hours as needed for pain -apply ice packs to your back for 10 minutes at a time, followed by a 10 minute break -resume your regular daily activities  Restart taking Protonix, 1 tablet once per day.  Please return for a follow-up visit in about 2 weeks.   Treatment Goals:  Goals (1 Years of Data) as of 04/16/13   None      Progress Toward Treatment Goals:  No flowsheet data found.  Self Care Goals & Plans:  No flowsheet data found.  No flowsheet data found.   Care Management & Community Referrals:  Referral 02/14/2013  Referrals made to community resources none      Herniated Disk The bones of your spinal column (vertebrae) protect your spinal cord and nerves that go into your arms and legs. The vertebrae are separated by disks that cushion the spinal column and put space between your vertebrae. This allows movement between the vertebrae, which allows you to bend, rotate, and move your body from side to side. Sometimes, the disks move out of place (herniate) or break open (rupture) from injury or strain. The most common area for a disk herniation is in the lower back (lumbar area). Sometimes herniation occurs in the neck (cervical) disks.  CAUSES  As we grow older, the strong, fibrous cords that connect the vertebrae and support and surround the disks (ligaments) start to weaken. A strain on the back may cause a break in the disk ligaments. RISK FACTORS Herniated disks occur most often in men who are aged 18 years to 35 years, usually after strenuous activity. Other risk factors include conditions present at birth (congenital) that affect the size of the  lumbar spinal canal. Additionally, a narrowing of the areas where the nerves exit the spinal canal can occur as you age. SYMPTOMS  Symptoms of a herniated disk vary. You may have weakness in certain muscles. This weakness can include difficulty lifting your leg or arm, difficulty standing on your toes on one side, or difficulty squeezing tightly with one of your hands. You may have numbness. You may feel a mild tingling, dull ache, or a burning or pulsating pain. In some cases, the pain is severe enough that you are unable to move. The pain most often occurs on one side of the body. The pain often starts slowly. It may get worse:  After you sit or stand.  At night.  When you sneeze, cough, or laugh.  When you bend backwards or walk more than a few yards. The pain, numbness, or weakness will often go away or improve a lot over a period of weeks to months. Herniated lumbar disk Symptoms of a herniated lumbar disk may include sharp pain in one part of your leg, hip, or buttocks and numbness in other parts. You also may feel pain or numbness on the back of your calf or the top or sole of your foot. The same leg also may feel weak. Herniated cervical disk Symptoms of a herniated cervical disk may include pain when you move your neck, deep pain near or over your shoulder blade, or pain that moves to your upper arm, forearm, or fingers. DIAGNOSIS  To diagnose a  herniated disk, your caregiver will perform a physical exam. Your caregiver also may perform diagnostic tests to see your disk or to test the reaction of your muscles and the function of your nerves. During the physical exam, your caregiver may ask you to:  Sit, stand, and walk. While you walk, your caregiver may ask you to try walking on your toes and then your heels.  Bend forward, backward, and sideways.  Raise your shoulders, elbow, wrist, and fingers and check your strength during these tasks. Your caregiver will check for:  Numbness  or loss of feeling.  Muscle reflexes, which may be slower or missing.  Muscle strength, which may be weaker.  Posture or the way your spine curves. Diagnostic tests that may be done include:  A spinal X-ray exam to rule out other causes of back pain.  Magnetic resonance imaging (MRI) or computed tomography (CT) scan, which will show if the herniated disk is pressing on your spinal canal.  Electromyography. This is sometimes used to identify the specific area of nerve involvement. TREATMENT  Initial treatment for a herniated disk is a short period of rest with medicines for pain. Pain medicines can include nonsteroidal anti-inflammatory medicines (NSAIDs), muscle relaxants for back spasms, and (rarely) narcotic pain medicine for severe pain that does not respond to NSAID use. Bed rest is often limited to 1 or 2 days at the most because prolonged rest can delay recovery. When the herniation involves the lower back, sitting should be avoided as much as possible because sitting increases pressure on the ruptured disk. Sometimes a soft neck collar will be prescribed for a few days to weeks to help support your neck in the case of a cervical herniation. Physical therapy is often prescribed for patients with disk disease. Physical therapists will teach you how to properly lift, dress, walk, and perform other activities. They will work on strengthening the muscles that help support your spine. In some cases, physical therapy alone is not enough to treat a herniated disk. Steroid injections along the involved nerve root may be needed to help control pain. The steroid is injected in the area of the herniated disk and helps by reducing swelling around the disk. Sometimes surgery is the best option to treat a herniated disk.  SEEK IMMEDIATE MEDICAL CARE IF:   You have numbness, tingling, weakness, or problems with the use of your arms or legs.  You have severe headaches that are not relieved with the use  of medicines.  You notice a change in your bowel or bladder control.  You have increasing pain in any areas of your body.  You experience shortness of breath, dizziness, or fainting. MAKE SURE YOU:   Understand these instructions.  Will watch your condition.  Will get help right away if you are not doing well or get worse. Document Released: 04/02/2000 Document Revised: 06/28/2011 Document Reviewed: 11/06/2010 [facility] Patient Information 2014 [place], [place].

## 2013-04-16 NOTE — Assessment & Plan Note (Signed)
The patient stop terbinafine for unclear reasons. LFTs were normal at 0 and 6 months.  Given acute back pain, we can put a hold on restarting this, it would be reasonable to restart this in the future if the patient continues to desire treatment for onychomycosis.

## 2013-04-16 NOTE — Progress Notes (Signed)
Case discussed with Dr. Brown at time of visit.  We reviewed the resident's history and exam and pertinent patient test results.  I agree with the assessment, diagnosis, and plan of care documented in the resident's note. 

## 2013-04-16 NOTE — Assessment & Plan Note (Signed)
Symptoms have significantly improved with triamcinolone cream

## 2013-04-16 NOTE — Progress Notes (Signed)
HPI The patient is a 57 y.o. female with a history of COPD, PUD, stress incontinence, presenting for an acute visit for low back/leg pain.  The patient notes that 7 days ago, the patient awoke one morning, got out of bed, and developedacute onset of right lower back pain.  Over the course of a week, the back pain improved, but "moved" to involve her right posterior leg, radiating from her hip to behind her knee.  Pain is exacerbated by moving her leg, coughing, or standing.  No trauma or heavy lifting preceding pain.  The patient tried taking ibuprofen last week, which helped the pain somewhat.  She notes no weakness, numbness, or tingling, and no loss of bowel/bladder incontinence.    The patient was previously taking Terbinafine for toenail fungus, but stopped taking them 1 month ago due to concern for interaction with pain medication.  ROS: General: no fevers, chills, changes in weight, changes in appetite Skin: no rash HEENT: no blurry vision, hearing changes, sore throat Pulm: no dyspnea, coughing, wheezing CV: no chest pain, palpitations, shortness of breath Abd: no abdominal pain, nausea/vomiting, diarrhea/constipation GU: no dysuria, hematuria, polyuria Ext: see HPI Neuro: no weakness, numbness, or tingling  Filed Vitals:   04/16/13 1408  BP: 123/85  Pulse: 96  Temp: 98.5 F (36.9 C)    PEX General: alert, cooperative, and in no apparent distress HEENT: pupils equal round and reactive to light, vision grossly intact, oropharynx clear and non-erythematous  Neck: supple Lungs: clear to ascultation bilaterally, normal work of respiration, no wheezes, rales, ronchi Heart: regular rate and rhythm, no murmurs, gallops, or rubs Abdomen: soft, non-tender, non-distended, normal bowel sounds Back: lumbar spine midline tenderness to palpation Extremities: straight leg raise test positive, no ttp to large muscle groups of legs Neurologic: alert & oriented X3, cranial nerves II-XII  intact, strength grossly intact though limited by pain, sensation intact to light touch  Current Outpatient Prescriptions on File Prior to Visit  Medication Sig Dispense Refill  . albuterol (PROVENTIL HFA;VENTOLIN HFA) 108 (90 BASE) MCG/ACT inhaler Inhale 2 puffs into the lungs every 6 (six) hours as needed for wheezing.      [name] albuterol (PROVENTIL) (2.5 MG/3ML) 0.083% nebulizer solution Take 3 mLs (2.5 mg total) by nebulization every 6 (six) hours as needed for wheezing.  75 mL  12  . albuterol (PROVENTIL) (5 MG/ML) 0.5% nebulizer solution Take 0.5 mLs (2.5 mg total) by nebulization every 6 (six) hours.  20 mL  12  . desloratadine (CLARINEX) 5 MG tablet Take 5 mg by mouth daily.      . diclofenac sodium (VOLTAREN) 1 % GEL Apply 1 application topically 4 (four) times daily.  1 Tube  1  . EPINEPHrine (EPIPEN) 0.3 mg/0.3 mL SOAJ Inject 0.3 mLs (0.3 mg total) into the skin once as needed.  1 Device  6  . Fluticasone-Salmeterol (ADVAIR) 500-50 MCG/DOSE AEPB Inhale 1 puff into the lungs 2 (two) times daily.  180 each  3  . guaiFENesin (MUCINEX) 600 MG 12 hr tablet Take 1 tablet (600 mg total) by mouth 2 (two) times daily.  60 tablet  0  . ipratropium (ATROVENT) 0.02 % nebulizer solution Take 2.5 mLs (500 mcg total) by nebulization 4 (four) times daily as needed (shortness of breath.).  75 mL  12  . naproxen (NAPROSYN) 500 MG tablet Take 1 tablet (500 mg total) by mouth 2 (two) times daily with a meal.  40 tablet  0  . pantoprazole (PROTONIX) 40  MG tablet Take 1 tablet (40 mg total) by mouth daily.  30 tablet  11  . predniSONE (DELTASONE) 10 MG tablet Take 4 tabs PO daily for 2 days, then 3 tabs/day for 2 days, then 2 tabs/day for 2 days, then 1 tab/day for 2 days  20 tablet  0  . terbinafine (LAMISIL) 250 MG tablet Take 1 tablet (250 mg total) by mouth daily.  30 tablet  2  . traMADol (ULTRAM) 50 MG tablet Take 1 tablet (50 mg total) by mouth every 6 (six) hours as needed for pain.  20 tablet  0  .  traZODone (DESYREL) 25 mg TABS Take 25 mg by mouth every other day.       . triamcinolone cream (KENALOG) 0.5 % Apply topically 2 (two) times daily. To affected areas of palms and feet  30 g  2   No current facility-administered medications on file prior to visit.    Assessment/Plan

## 2013-04-16 NOTE — Assessment & Plan Note (Addendum)
The patient is a one-week history of low back pain radiating to her right leg, consistent with a herniated disc. No right eye symptoms of prior cancer, numbness/weakness/tingling, or bowel/bladder incontinence.  The patient has used steroids in the past for COPD, but only for short intervals.  Straight leg raise largely positive. -Start Mobic, 7.5-15 mg daily -Will give Vicodin 5-325 Q8 when necessary for breakthrough pain -Encouraged use of ice packs -Encouraged regular daily activity  -The patient states that she normally just takes Protonix during the summer. I instructed the patient to restart taking Protonix daily while on Mobic

## 2013-04-30 ENCOUNTER — Encounter: Payer: Self-pay | Admitting: Internal Medicine

## 2013-04-30 ENCOUNTER — Ambulatory Visit (INDEPENDENT_AMBULATORY_CARE_PROVIDER_SITE_OTHER): Payer: Medicaid Other | Admitting: Internal Medicine

## 2013-04-30 VITALS — BP 106/74 | HR 78 | Temp 98.5°F | Ht 60.0 in | Wt 205.3 lb

## 2013-04-30 DIAGNOSIS — M5126 Other intervertebral disc displacement, lumbar region: Secondary | ICD-10-CM

## 2013-04-30 MED ORDER — SENNOSIDES-DOCUSATE SODIUM 8.6-50 MG PO TABS: 1.0000 | ORAL_TABLET | Freq: Every day | ORAL | Status: DC

## 2013-04-30 NOTE — Assessment & Plan Note (Signed)
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 No warning symptoms.  -sports medicine referral -cont previous symptomatic management  -pt didn't need refill on narcotic pain medication

## 2013-04-30 NOTE — Progress Notes (Signed)
   Subjective:    Patient ID: Maria Nelson, female    DOB: [birth year], 58 y.o.   MRN: [id number]  HPI Maria Nelson is a 58 yo woman pmh as listed below presenting for 2wk recheck on her low back pain.   Pt was started on Mobic and symptomatic management. She states that since that time she has little to no improvement with symptoms. Still having pain with ambulation, sitting up in bed, and bending over all these activities cause sharp shooting pain down her right leg. She tried the heat/cold compress and pain medication. The narcotics provide complete but short pain relief. She is also having some constipation that causes her to strain and increases the back pain.    Review of Systems  Constitutional: Positive for activity change (limited by pain). Negative for fever, chills, fatigue and unexpected weight change.  Respiratory: Negative for cough and shortness of breath.   Cardiovascular: Negative for chest pain, palpitations and leg swelling.  Gastrointestinal: Positive for constipation. Negative for nausea, abdominal pain and diarrhea.  Genitourinary: Negative for difficulty urinating.       No urinary or stool incontinence    Musculoskeletal: Positive for back pain. Negative for myalgias and neck pain.  Neurological: Positive for numbness. Negative for weakness.       No saddle anesthesia     Past Medical History  Diagnosis Date  . COPD (chronic obstructive pulmonary disease)     secondary to tobacco use // No PFTs on file  . Gastrointestinal hemorrhage     secondary to PUD on March 2008  . Peptic ulcer disease     +H. pylori (antigen)- Dr. Olevia Perches- treated  . ETOH abuse     Pt stopped in 3/08  . Tobacco abuse     quit in 2010  . OSA on CPAP     noncompliant with CPAP (2/2 it being depressing)  . Urinary incontinence   . Carpal tunnel syndrome 06/23/2011    Patient notes history of bilateral carpal tunnel syndrome.  Now has symptoms of right hand carpal tunnel.   .  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 STRESS INCONTINENCE 01/24/2007   Social, surgical, family history reviewed with patient and updated in appropriate chart locations.      Objective:   Physical Exam Filed Vitals:   04/30/13 1446  BP: 106/74  Pulse: 78  Temp: 98.5 F (36.9 C)   General: sitting in chair, uncomfortable but NAD HEENT: PERRL, EOMI, no scleral icterus Cardiac: RRR, no rubs, murmurs or gallops Pulm: clear to auscultation bilaterally, moving normal volumes of air Abd: soft, nontender, nondistended, BS present Ext: warm and well perfused, no pedal edema MSK: straight leg raise positive, lumbar paraspinal muscles ttp, unable to walk on tip toes or heals 2/2 pain, 5/5 LE strength bilaterally, FROM of hip w/o pain Neuro: alert and oriented X3, cranial nerves II-XII grossly intact     Assessment & Plan:  Please see problem oriented charting  Pt discussed with Dr. Lynnae January

## 2013-04-30 NOTE — Patient Instructions (Addendum)
I am sorry you are not feeling better but continue with the pain medication until you are able to see the Sports Medicine doctors.   Continue walking or moving as much as you can.   Take the new medicine Senna- S or Miralax over the counter for the constipation that is coming from the pain medicine and that will help ease your back pain.   Have a happy new year!

## 2013-05-04 NOTE — Progress Notes (Signed)
Case discussed with Dr. Sadek soon after the resident saw the patient.  We reviewed the resident's history and exam and pertinent patient test results.  I agree with the assessment, diagnosis, and plan of care documented in the resident's note. 

## 2013-05-09 ENCOUNTER — Other Ambulatory Visit: Payer: Self-pay | Admitting: *Deleted

## 2013-05-10 ENCOUNTER — Telehealth: Payer: Self-pay | Admitting: *Deleted

## 2013-05-10 MED ORDER — HYDROXYZINE PAMOATE 25 MG PO CAPS: 100.0000 mg | ORAL_CAPSULE | Freq: Once | ORAL | Status: DC | PRN

## 2013-05-10 MED ORDER — HYDROCODONE-ACETAMINOPHEN 5-325 MG PO TABS: 1.0000 | ORAL_TABLET | Freq: Three times a day (TID) | ORAL | Status: DC | PRN

## 2013-05-10 NOTE — Telephone Encounter (Signed)
One-time only refill on hydrocodone, due to herniated disc.  If symptoms persist, pt will need another appointment for re-evaluation.

## 2013-05-10 NOTE — Telephone Encounter (Signed)
Pt request refill on hydroxyzine 25 mg - she uses for hives or facial swelling Last written 04/07/10 # 30 with 4 refills

## 2013-05-21 ENCOUNTER — Encounter: Payer: Self-pay | Admitting: Family Medicine

## 2013-05-21 ENCOUNTER — Other Ambulatory Visit: Payer: Self-pay | Admitting: *Deleted

## 2013-05-21 ENCOUNTER — Ambulatory Visit (INDEPENDENT_AMBULATORY_CARE_PROVIDER_SITE_OTHER): Payer: Medicaid Other | Admitting: Family Medicine

## 2013-05-21 VITALS — BP 133/95 | Ht 60.0 in | Wt 203.0 lb

## 2013-05-21 DIAGNOSIS — M5126 Other intervertebral disc displacement, lumbar region: Secondary | ICD-10-CM

## 2013-05-21 MED ORDER — DIAZEPAM 5 MG PO TABS: ORAL_TABLET | ORAL | Status: DC

## 2013-05-21 MED ORDER — GABAPENTIN 100 MG PO CAPS: ORAL_CAPSULE | ORAL | Status: DC

## 2013-05-21 MED ORDER — PREDNISONE 50 MG PO TABS: ORAL_TABLET | ORAL | Status: DC

## 2013-05-21 NOTE — Patient Instructions (Signed)
Taper off the hydrocodone as you are able. It is okay to take that with the other medicines. Start the gabapentin and taper it up as below Take by tablet by mouth Day 1-3:   take one at night  Day 4-6:  Take 2 at night Day 7-10:  take 3 at night Day 11-14:  3 at night, 1 in am Day 15-18:   3 at night, 2 in am Day 19-21: 3 at night, 3 in am Day 22-25: 3 at night, 3 in am, 2 at lunch Day 26 forward : 3 at night, 3 in am, 3 at lunch  If at any point during the taper up she started to feel dizzy he can either back down a dose for hold at that dose  I'm going to give you steroids for 7 days, one pill per day. We are setting up for an MRI. We will have become back to see me in clinic after the MRIs to go ahead and set that appointment up at your convenience.

## 2013-05-21 NOTE — Progress Notes (Signed)
   Subjective:    Patient ID: Maria Nelson, female    DOB: August 15, 1955, 58 y.o.   MRN: [id number]  HPI  2 months of radicular right leg pain. Worse with sneezing, coughing, bowel movement, walking up steps. Does not have a specific incident that occurred but does remember a specific time. When she started noticing issues. No change in bowel or bladder habits, no incontinence, occasion the right leg feels a little numb in the anterior portion and sometimes it feels weak.  PERTINENT  PMH / PSH: No prior back surgery or injury. COPD/asthma Obesity. Review of Systems No fever, sweats, chills or unusual weight change.    Objective:   Physical Exam  Signs are reviewed GENERAL: Well-developed overweight female no acute distress EXTREMITY: Bilateral lower extremity strength hip flexion, knee flexion extension, dorsiflexion plantar flexion 5 out of 5 bilaterally symmetrical. Straight leg raise positive on the right negative on the left. The right side is positive in seated and supine positions at about 50. BACK: Nontender to palpation no deformity. Flexion at the hips is normal although limited by hamstring flexibility. NEURO: Intact neurovascular status to soft touch and proprioception bilateral lower extremity.      Assessment & Plan:  Right radicular pain very consistent with herniated nucleus pulposus. We'll get MRI. Taper her off the hydrocodone start her on gabapentin. We'll give her Valium for the imaging study at she's mildly claustrophobic. We'll also do a seven-day steroid burst. Discussed side effects of the medicines. I'll see her back after the MRI. We discussed red flags to watch out for which case she will contact us sooner.

## 2013-05-26 ENCOUNTER — Ambulatory Visit
Admission: RE | Admit: 2013-05-26 | Discharge: 2013-05-26 | Disposition: A | Discharge status: Home / Self Care | Payer: Medicaid Other | Source: Ambulatory Visit | Attending: Family Medicine | Admitting: Family Medicine

## 2013-05-26 DIAGNOSIS — M5126 Other intervertebral disc displacement, lumbar region: Secondary | ICD-10-CM

## 2013-05-28 ENCOUNTER — Encounter: Payer: Self-pay | Admitting: Family Medicine

## 2013-05-28 ENCOUNTER — Ambulatory Visit (INDEPENDENT_AMBULATORY_CARE_PROVIDER_SITE_OTHER): Payer: Medicaid Other | Admitting: Family Medicine

## 2013-05-28 VITALS — BP 106/71 | HR 63 | Ht 60.0 in | Wt 203.0 lb

## 2013-05-28 DIAGNOSIS — M5126 Other intervertebral disc displacement, lumbar region: Secondary | ICD-10-CM

## 2013-05-28 NOTE — Progress Notes (Signed)
Maria Nelson is a 58 y.o. female who presents to [facility] today for f/u for back pain w/ radiation down R leg  Back pain: significantly improved. Pt feels the gabapentin is helping the most. CUrrently taking 200mg  every night. Completely off hydrocodone since last week. Tomorrow will be last dose of prednisone. Performing leg exercises at home w/ some benefit. Pain is minimally achy. Denies any change in sensation of LE, loss of bowel or bladder function, falls.   PMH reviewed. ROS as above otherwise neg Medications reviewed.  Exam:  There were no vitals taken for this visit. Gen: Well NAD MSK: ROM of lumbar spine w/ flexion and extension limited secondary to body habitus. Non-ttp. Sitting and standing w/o pain and straight leg raise w/o pain bilat.   Mr Lumbar Spine Wo Contrast  05/26/2013   CLINICAL DATA:  Low back pain. Weakness and numbness in the right leg.  EXAM: MRI LUMBAR SPINE WITHOUT CONTRAST  TECHNIQUE: Multiplanar, multisequence MR imaging was performed. No intravenous contrast was administered.  COMPARISON:  None.  FINDINGS: T10-11: No disc pathology. Facet degeneration left worse than right. No apparent neural compression.  T11-12: Disc degeneration with shallow protrusion of disc material that indents the ventral subarachnoid space but does not affect the neural structures.  T12-L1 and L1-2:  Normal interspace is.  Conus tip at upper L1.  L2-3: The disc is degenerated and shows a moderate disc bulge. This indents the ventral subarachnoid space but does not appear to cause neural compression.  L3-4: Desiccation of the disc. Moderate bulging of the disc. Mild foraminal narrowing bilaterally without gross neural compression.  L4-5: Bilateral facet arthropathy and ligamentous hypertrophy with some edema. Anterolisthesis of 2 mm. Bulging of the disc. Mild narrowing of the lateral recesses without gross neural compression. Findings could certainly relate to back pain.  L5-S1: Mild facet  degeneration with 1 mm of anterolisthesis. No disc pathology. No stenosis.  IMPRESSION: L2-3:  Noncompressive disc bulge.  L3-4: Moderate bulging of the disc. Mild foraminal narrowing bilaterally without definite neural compression. This could possibly cause neural irritation on either side.  Facet arthropathy at L4-5 and L5-S1. Anterolisthesis at L4-5 of 2 mm. Edema of the facets at L4-5. No apparent compressive stenosis. The findings could certainly relate to low back pain.   Electronically Signed   By: Nelson Chimes M.D.   On: 05/26/2013 09:18     Assessment and Plan: 1) lumbar R radicular pain much improved after starting prednisone course and gabapentin. Reviewed MRI w/ pt, noting possible L3-4 nerve root compression w/ other non contributory vertebral pathology.   - continue to hold hydrocodone - Continue to increase gabapentin dose to 300mg  QHS - No NSAID due to h/o stomach ulceration - Tylenol PRN pain - f/u in 4 wks  Linna Darner, MD Family Medicine PGY-3 05/28/2013, 2:01 PM

## 2013-05-28 NOTE — Patient Instructions (Signed)
You are doing very well The MRI showed an area of your back that may be pinching the nerve You have responded very well to the steroids and gabapentin.  Please finish your last dose of steroids and increase your gabapentin to 300mg  at night Please use tylenol (up to 1,000mg  every 6 hours) for additional pain and return in 4 weeks to follow up with Dr. Nori Riis Continue your daily exercises

## 2013-05-31 NOTE — Progress Notes (Signed)
[place] Attending Note: I have seen and examined this patient. I have discussed this patient with the resident and reviewed the assessment and plan as documented above. I agree with the resident's findings and plan. I am please she's had such great relief of her pain. I would hold her to 300 mg dose of gabapentin and see her back in a month. She has gone the interim if this is worsening or she has new or changing symptoms. She has tapered off the hydrocodone.

## 2013-06-13 ENCOUNTER — Ambulatory Visit: Payer: Medicaid Other | Admitting: Internal Medicine

## 2013-06-25 ENCOUNTER — Ambulatory Visit (INDEPENDENT_AMBULATORY_CARE_PROVIDER_SITE_OTHER): Payer: Medicaid Other | Admitting: Family Medicine

## 2013-06-25 ENCOUNTER — Encounter: Payer: Self-pay | Admitting: Family Medicine

## 2013-06-25 VITALS — BP 109/75 | HR 80 | Ht 60.0 in | Wt 203.0 lb

## 2013-06-25 DIAGNOSIS — M5126 Other intervertebral disc displacement, lumbar region: Secondary | ICD-10-CM

## 2013-06-25 MED ORDER — GABAPENTIN 100 MG PO CAPS: ORAL_CAPSULE | ORAL | Status: DC

## 2013-06-25 MED ORDER — POLYETHYLENE GLYCOL 3350 17 G PO PACK: 17.0000 g | PACK | Freq: Every day | ORAL | Status: DC

## 2013-06-25 MED ORDER — POLYETHYLENE GLYCOL 1000 POWD: Status: DC

## 2013-06-25 NOTE — Patient Instructions (Signed)
Take by tablet by mouth Day 1-3:   take one at night  Day 4-6:  Take 2 at night Day 7-10:  take 3 at night Day 11-14:  3 at night, 1 in am Day 15-18:   3 at night, 2 in am Day 19-21: 3 at night, 3 in am Day 22-25: 3 at night, 3 in am, 2 at lunch Day 26 forward : 3 at night, 3 in am, 3 at lunch   

## 2013-06-25 NOTE — Progress Notes (Signed)
   Subjective:    Patient ID: Maria Nelson, female    DOB: 07-Feb-1956, 58 y.o.   MRN: [id number]  HPI  Followup back pain with radiculopathy. She has increased her gabapentin to 300 mg at night. She's about 20% better.  Review of Systems No problems with the medicine no dizziness, no fever, sweats, chills. No incontinence of bowel or bladder.    Objective:   Physical Exam Vital signs are reviewed GENERAL: Well-developed female no acute distress EXTREMITY: Lower extremity strength 5 out of 5. She is distally neurovascularly intact.       Assessment & Plan:  Back pain with radiculopathy, this corresponds with foraminal stenosis and spinal stenosis seen on her MRI. I had intended for her to taper up to 900 mg total gabapentin before so her back but somehow that got confused. We discussed that appropriate taper up and I'll see her back in about a month. She's also having some constipation where she had used some Vicodin. She's only using one or 2 Vicodin a week now but I'll give her some MiraLax to see if we get constipation under control.

## 2013-07-10 ENCOUNTER — Other Ambulatory Visit: Payer: Self-pay | Admitting: Internal Medicine

## 2013-07-10 DIAGNOSIS — Z1231 Encounter for screening mammogram for malignant neoplasm of breast: Secondary | ICD-10-CM

## 2013-07-16 ENCOUNTER — Ambulatory Visit (HOSPITAL_COMMUNITY)
Admission: RE | Admit: 2013-07-16 | Discharge: 2013-07-16 | Disposition: A | Discharge status: Home / Self Care | Payer: Medicaid Other | Source: Ambulatory Visit | Attending: Internal Medicine | Admitting: Internal Medicine

## 2013-07-16 DIAGNOSIS — Z1231 Encounter for screening mammogram for malignant neoplasm of breast: Secondary | ICD-10-CM | POA: Insufficient documentation

## 2013-07-23 ENCOUNTER — Encounter (HOSPITAL_COMMUNITY): Payer: Self-pay | Admitting: Emergency Medicine

## 2013-07-23 ENCOUNTER — Emergency Department (HOSPITAL_COMMUNITY): Payer: Medicaid Other

## 2013-07-23 ENCOUNTER — Emergency Department (HOSPITAL_COMMUNITY)
Admission: EM | Admit: 2013-07-23 | Discharge: 2013-07-23 | Disposition: A | Discharge status: Home / Self Care | Payer: Medicaid Other | Attending: Emergency Medicine | Admitting: Emergency Medicine

## 2013-07-23 DIAGNOSIS — I10 Essential (primary) hypertension: Secondary | ICD-10-CM | POA: Insufficient documentation

## 2013-07-23 DIAGNOSIS — Z79899 Other long term (current) drug therapy: Secondary | ICD-10-CM | POA: Insufficient documentation

## 2013-07-23 DIAGNOSIS — G4733 Obstructive sleep apnea (adult) (pediatric): Secondary | ICD-10-CM | POA: Insufficient documentation

## 2013-07-23 DIAGNOSIS — T783XXA Angioneurotic edema, initial encounter: Secondary | ICD-10-CM

## 2013-07-23 DIAGNOSIS — Z8719 Personal history of other diseases of the digestive system: Secondary | ICD-10-CM | POA: Insufficient documentation

## 2013-07-23 DIAGNOSIS — J441 Chronic obstructive pulmonary disease with (acute) exacerbation: Secondary | ICD-10-CM | POA: Insufficient documentation

## 2013-07-23 DIAGNOSIS — Z8711 Personal history of peptic ulcer disease: Secondary | ICD-10-CM | POA: Insufficient documentation

## 2013-07-23 DIAGNOSIS — R220 Localized swelling, mass and lump, head: Secondary | ICD-10-CM

## 2013-07-23 DIAGNOSIS — Z87891 Personal history of nicotine dependence: Secondary | ICD-10-CM | POA: Insufficient documentation

## 2013-07-23 DIAGNOSIS — Z791 Long term (current) use of non-steroidal anti-inflammatories (NSAID): Secondary | ICD-10-CM | POA: Insufficient documentation

## 2013-07-23 LAB — BASIC METABOLIC PANEL
BUN: 13 mg/dL (ref 6–23)
CO2: 28 mEq/L (ref 19–32)
CREATININE: 1.03 mg/dL (ref 0.50–1.10)
Calcium: 9.1 mg/dL (ref 8.4–10.5)
Chloride: 107 mEq/L (ref 96–112)
GFR calc non Af Amer: 59 mL/min — ABNORMAL LOW (ref >90)
GFR, EST AFRICAN AMERICAN: 68 mL/min — AB (ref >90)
Glucose, Bld: 72 mg/dL (ref 70–99)
Potassium: 4.2 mEq/L (ref 3.7–5.3)
Sodium: 145 mEq/L (ref 137–147)

## 2013-07-23 LAB — CBC WITH DIFFERENTIAL/PLATELET
BASOS PCT: 1 % (ref 0–1)
Basophils Absolute: 0 10*3/uL (ref 0.0–0.1)
Eosinophils Absolute: 1 10*3/uL — ABNORMAL HIGH (ref 0.0–0.7)
Eosinophils Relative: 12 % — ABNORMAL HIGH (ref 0–5)
HEMATOCRIT: 40.4 % (ref 36.0–46.0)
HEMOGLOBIN: 13.5 g/dL (ref 12.0–15.0)
Lymphocytes Relative: 46 % (ref 12–46)
Lymphs Abs: 3.7 10*3/uL (ref 0.7–4.0)
MCH: 27.1 pg (ref 26.0–34.0)
MCHC: 33.4 g/dL (ref 30.0–36.0)
MCV: 81 fL (ref 78.0–100.0)
MONO ABS: 0.5 10*3/uL (ref 0.1–1.0)
MONOS PCT: 7 % (ref 3–12)
Neutro Abs: 2.7 10*3/uL (ref 1.7–7.7)
Neutrophils Relative %: 34 % — ABNORMAL LOW (ref 43–77)
Platelets: 259 10*3/uL (ref 150–400)
RBC: 4.99 MIL/uL (ref 3.87–5.11)
RDW: 13.8 % (ref 11.5–15.5)
WBC: 7.9 10*3/uL (ref 4.0–10.5)

## 2013-07-23 MED ORDER — DIPHENHYDRAMINE HCL 50 MG/ML IJ SOLN
50.0000 mg | Freq: Once | INTRAMUSCULAR | Status: AC
  Administered 2013-07-23: 50 mg via INTRAVENOUS
  Filled 2013-07-23: qty 1

## 2013-07-23 MED ORDER — DEXAMETHASONE SODIUM PHOSPHATE 10 MG/ML IJ SOLN
10.0000 mg | Freq: Once | INTRAMUSCULAR | Status: AC
  Administered 2013-07-23: 10 mg via INTRAVENOUS
  Filled 2013-07-23: qty 1

## 2013-07-23 NOTE — ED Provider Notes (Signed)
I saw and evaluated the patient, reviewed the resident's note and I agree with the findings and plan.   EKG Interpretation None       Results for orders placed during the hospital encounter of 07/23/13  CBC WITH DIFFERENTIAL      Result Value Ref Range   WBC 7.9  4.0 - 10.5 K/uL   RBC 4.99  3.87 - 5.11 MIL/uL   Hemoglobin 13.5  12.0 - 15.0 g/dL   HCT 40.4  36.0 - 46.0 %   MCV 81.0  78.0 - 100.0 fL   MCH 27.1  26.0 - 34.0 pg   MCHC 33.4  30.0 - 36.0 g/dL   RDW 13.8  11.5 - 15.5 %   Platelets 259  150 - 400 K/uL   Neutrophils Relative % 34 (*) 43 - 77 %   Neutro Abs 2.7  1.7 - 7.7 K/uL   Lymphocytes Relative 46  12 - 46 %   Lymphs Abs 3.7  0.7 - 4.0 K/uL   Monocytes Relative 7  3 - 12 %   Monocytes Absolute 0.5  0.1 - 1.0 K/uL   Eosinophils Relative 12 (*) 0 - 5 %   Eosinophils Absolute 1.0 (*) 0.0 - 0.7 K/uL   Basophils Relative 1  0 - 1 %   Basophils Absolute 0.0  0.0 - 0.1 K/uL  BASIC METABOLIC PANEL      Result Value Ref Range   Sodium 145  137 - 147 mEq/L   Potassium 4.2  3.7 - 5.3 mEq/L   Chloride 107  96 - 112 mEq/L   CO2 28  19 - 32 mEq/L   Glucose, Bld 72  70 - 99 mg/dL   BUN 13  6 - 23 mg/dL   Creatinine, Ser 1.03  0.50 - 1.10 mg/dL   Calcium 9.1  8.4 - 10.5 mg/dL   GFR calc non Af Amer 59 (*) >90 mL/min   GFR calc Af Amer 68 (*) >90 mL/min   Dg Neck Soft Tissue  07/23/2013   CLINICAL DATA:  Short of breath. Facial swelling. Difficulty swallowing.  EXAM: NECK SOFT TISSUES - 1+ VIEW  COMPARISON:  None.  FINDINGS: Epiglottis and aryepiglottic folds appear within normal limits. Cervical spondylosis is present. Prevertebral soft tissues are thickened in the upper neck, measuring 10 mm anterior disc C2. Tracheal air column appears within normal limits.  IMPRESSION: Thickening of the superior prevertebral soft tissues. In a patient with facial swelling, consider CT neck with contrast for further assessment. The plain film appearance is nonspecific.   Electronically Signed    By: Dereck Ligas M.D.   On: 07/23/2013 19:28   Mm Digital Screening Bilateral  07/17/2013   CLINICAL DATA:  Screening.  EXAM: DIGITAL SCREENING BILATERAL MAMMOGRAM WITH CAD  COMPARISON:  Previous exam(s).  ACR Breast Density Category b: There are scattered areas of fibroglandular density.  FINDINGS: There are no findings suspicious for malignancy. Images were processed with CAD.  IMPRESSION: No mammographic evidence of malignancy. A result letter of this screening mammogram will be mailed directly to the patient.  RECOMMENDATION: Screening mammogram in one year. (Code:SM-B-01Y)  BI-RADS CATEGORY  1: Negative.   Electronically Signed   By: Enrique Sack M.D.   On: 07/17/2013 17:19    Patient improved in the emergency department with the steroid Pepcid and Benadryl. Patient now feeling less tight in the throat. Lip swelling on the right upper lip area is improved. He can not completely resolved. Patient  can be discharged home with continuation of prednisone Pepcid and Benadryl. Precautions provided. Patient never had any tongue swelling. She's had similar symptoms happened in the past is known to have an allergy to bee venom and has an EpiPen at home however the cause of this is not clear.  Mervin Kung, MD 07/23/13 [id number]

## 2013-07-23 NOTE — ED Notes (Signed)
Pt c/o swelling and itching to face.  Onset this am.  Lungs clear at this time.  Pt speaking in full sentences.  Pt also c/o throat feeling tight.  St's she took med for itching before coming to ED without relief.

## 2013-07-23 NOTE — ED Notes (Signed)
Patient complaining of right sided facial swelling, throat swelling, and lip swelling. Pt reports history of facial swelling. Currently, patient complains of shortness of breath.

## 2013-07-23 NOTE — ED Notes (Signed)
Pt st's itching is better but continues to have some itching around mouth.  St's she feels like throat is less swollen, can swallow easier now.  Pt continues to speak in full sentences.  Family at bedside.

## 2013-07-23 NOTE — ED Provider Notes (Signed)
CSN: [id number]     Arrival date & time 07/23/13  1603 History   First MD Initiated Contact with Patient 07/23/13 1726     Chief Complaint  Patient presents with  . Shortness of Breath  . Facial Swelling    HPI 58 year old female with a history of angioedema and COPD presents complaining of facial swelling. Onset was this morning when she woke up, several hours ago. No known injury. No known exposure to allergens. Swelling is located to her upper lip, but is isolated to the right side of her lip. She also has a sensation of swelling in her throat. She states she has mild shortness of breath. Her symptoms are mild to moderate in severity. They are not worsening, but not improving. These are similar symptoms that she's had numerous times in the past. Sometimes she has swelling of one side of her tongue. Sometimes she has swelling of one eyelid. Although she has a documented history of angioedema, she states that she has never heard of this before and no one has ever told her that she has this diagnosis. She states that she's talked to her primary care physician about the possibility that medication could be causing this and has been told that none of her medicines are responsible for her symptoms. She's had several ED visits in the past for similar symptoms. She's never required intubation.  On arrival she is afebrile, has normal vital signs as documented, normal sats, no acute distress. She's not tried any treatments prior to arrival. She's not taking any daily medications for this.  Past Medical History  Diagnosis Date  . COPD (chronic obstructive pulmonary disease)     secondary to tobacco use // No PFTs on file  . Gastrointestinal hemorrhage     secondary to PUD on March 2008  . Peptic ulcer disease     +H. pylori (antigen)- Dr. Olevia Perches- treated  . ETOH abuse     Pt stopped in 3/08  . Tobacco abuse     quit in 2010  . OSA on CPAP     noncompliant with CPAP (2/2 it being depressing)  .  Urinary incontinence   . Carpal tunnel syndrome 06/23/2011    Patient notes history of bilateral carpal tunnel syndrome.  Now has symptoms of right hand carpal tunnel.   . 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 STRESS INCONTINENCE 01/24/2007   Past Surgical History  Procedure Laterality Date  . Tubal ligation    . Mandible surgery      due to trauma   Family History  Problem Relation Age of Onset  . Diabetes Sister   . Breast cancer Sister 16    died 2/2 breast ca age 39-60  . Diabetes Mother   . Coronary artery disease Mother 50    requiring 5 vessel CABG   History  Substance Use Topics  . Smoking status: Former Smoker -- 0.40 packs/day    Types: Cigarettes    Quit date: 12/04/2010  . Smokeless tobacco: Not on file     Comment: stopped 3 weeks ago  . Alcohol Use: Yes     Comment: now drinking a beer on weekends //    OB History   Grav Para Term Preterm Abortions TAB SAB Ect Mult Living                 Review of Systems  Constitutional: Negative for fever, chills and diaphoresis.  HENT: Positive for facial swelling. Negative for congestion and rhinorrhea.  Respiratory: Positive for shortness of breath. Negative for cough, wheezing and stridor.   Cardiovascular: Negative for chest pain and leg swelling.  Gastrointestinal: Negative for nausea, vomiting, abdominal pain and diarrhea.  Genitourinary: Negative for dysuria, urgency, frequency, flank pain, vaginal bleeding, vaginal discharge and difficulty urinating.  Musculoskeletal: Negative for neck pain and neck stiffness.  Skin: Negative for rash.  Neurological: Negative for weakness, numbness and headaches.  All other systems reviewed and are negative.      Allergies  Bee venom and Aspirin  Home Medications   Current Outpatient Rx  Name  Route  Sig  Dispense  Refill  . albuterol (PROVENTIL HFA;VENTOLIN HFA) 108 (90 BASE) MCG/ACT inhaler   Inhalation   Inhale 2 puffs into the lungs every 6 (six) hours as needed for  wheezing.         [name] albuterol (PROVENTIL) (2.5 MG/3ML) 0.083% nebulizer solution   Nebulization   Take 3 mLs (2.5 mg total) by nebulization every 6 (six) hours as needed for wheezing.   75 mL   12   . EXPIRED: albuterol (PROVENTIL) (5 MG/ML) 0.5% nebulizer solution   Nebulization   Take 0.5 mLs (2.5 mg total) by nebulization every 6 (six) hours.   20 mL   12   . desloratadine (CLARINEX) 5 MG tablet   Oral   Take 5 mg by mouth daily.         . diclofenac sodium (VOLTAREN) 1 % GEL   Topical   Apply 1 application topically 4 (four) times daily.   1 Tube   1   . EPINEPHrine (EPIPEN) 0.3 mg/0.3 mL SOAJ   Subcutaneous   Inject 0.3 mLs (0.3 mg total) into the skin once as needed.   1 Device   6   . Fluticasone-Salmeterol (ADVAIR) 500-50 MCG/DOSE AEPB   Inhalation   Inhale 1 puff into the lungs 2 (two) times daily.   180 each   3   . gabapentin (NEURONTIN) 300 MG capsule   Oral   Take 300 mg by mouth at bedtime.         . hydrOXYzine (VISTARIL) 25 MG capsule   Oral   Take 4 capsules (100 mg total) by mouth once as needed for itching (facial swelling).   30 capsule   4   . ipratropium (ATROVENT) 0.02 % nebulizer solution   Nebulization   Take 2.5 mLs (500 mcg total) by nebulization 4 (four) times daily as needed (shortness of breath.).   75 mL   12   . meloxicam (MOBIC) 7.5 MG tablet   Oral   Take 1-2 tablets (7.5-15 mg total) by mouth daily.   30 tablet   1   . pantoprazole (PROTONIX) 40 MG tablet   Oral   Take 1 tablet (40 mg total) by mouth daily.   30 tablet   11   . polyethylene glycol (MIRALAX / GLYCOLAX) packet   Oral   Take 17 g by mouth daily as needed for mild constipation.          BP 139/71  Pulse 64  Temp(Src) 98.5 F (36.9 C) (Oral)  Resp 13  Ht 5' (1.524 m)  Wt 200 lb (90.719 kg)  BMI 39.06 kg/m2  SpO2 95% Physical Exam  Nursing note and vitals reviewed. Constitutional: She is oriented to person, place, and time. She  appears well-developed and well-nourished. No distress.  HENT:  Head: Normocephalic and atraumatic.  Mouth/Throat: Oropharynx is clear and moist.  No swelling  of the palate or uvula. She has swelling to the top lip, right half. No tongue swelling.  Eyes: Conjunctivae are normal. Pupils are equal, round, and reactive to light. No scleral icterus.  Neck: No JVD present. No tracheal deviation present.  No stridor.  Cardiovascular: Normal rate, regular rhythm, normal heart sounds and intact distal pulses.  Exam reveals no gallop and no friction rub.   No murmur heard. Pulmonary/Chest: Effort normal and breath sounds normal. No stridor. No respiratory distress. She has no wheezes. She has no rales.  Mild, scattered end expiratory wheezes. Normal work of breathing. Speaking in full senses. No accessory muscle use.  Abdominal: Soft. Bowel sounds are normal. She exhibits no distension. There is no tenderness. There is no rebound and no guarding.  Musculoskeletal: She exhibits no edema.  Neurological: She is alert and oriented to person, place, and time. No cranial nerve deficit. She exhibits normal muscle tone. Coordination normal.  Skin: Skin is warm and dry. No rash noted.  Psychiatric: She has a normal mood and affect.    ED Course  Procedures (including critical care time) Labs Review Labs Reviewed  CBC WITH DIFFERENTIAL - Abnormal; Notable for the following:    Neutrophils Relative % 34 (*)    Eosinophils Relative 12 (*)    Eosinophils Absolute 1.0 (*)    All other components within normal limits  BASIC METABOLIC PANEL - Abnormal; Notable for the following:    GFR calc non Af Amer 59 (*)    GFR calc Af Amer 68 (*)    All other components within normal limits   Imaging Review Dg Neck Soft Tissue  07/23/2013   CLINICAL DATA:  Short of breath. Facial swelling. Difficulty swallowing.  EXAM: NECK SOFT TISSUES - 1+ VIEW  COMPARISON:  None.  FINDINGS: Epiglottis and aryepiglottic folds appear  within normal limits. Cervical spondylosis is present. Prevertebral soft tissues are thickened in the upper neck, measuring 10 mm anterior disc C2. Tracheal air column appears within normal limits.  IMPRESSION: Thickening of the superior prevertebral soft tissues. In a patient with facial swelling, consider CT neck with contrast for further assessment. The plain film appearance is nonspecific.   Electronically Signed   By: Dereck Ligas M.D.   On: 07/23/2013 19:28     EKG Interpretation None      MDM   58 year old female with angioedema presents with swelling to her lip. The swelling is located to the right half of her upper with. She has a sensation of throat swelling. She has mild dyspnea. No stridor. Chest COPD and has some mild wheezing. She states that she's never been told she has angioedema before, however this diagnosis listed in her medical record and she's had several prior hospital visits for the same. She states that her symptoms are identical to symptoms she's had in the past. Usually the swelling involves either 1 eyelid, half of her lip, or half of her tongue.  On exam she has normal vital signs as documented, normal sats, is in no acute distress, well-appearing, no stridor, mild expiratory wheezing. She has swelling to the upper lip, right half. Normal uvula, normal palate.  She was given IV Benadryl and IV Decadron 10 mg. She was observed for several hours in the emergency department and on reevaluation her lip is almost back to normal with only minimal residual swelling. She states that she feels much better and is not having any difficulty breathing. She did have a neck soft tissue plain  film that demonstrated a normal epiglottis, some mild suggestion of swelling of her superior prevertebral soft tissues. I do not suspect she has a prevertebral or parapharyngeal abscess. I do not feel she needs further evaluation or admission. She is discharged home to self-care and is given  emergency Department return precautions. She is to discuss her diagnosis of angioedema further with her primary care physician as she does not seem to be aware of this diagnosis.  Final diagnoses:  Angioedema  Facial swelling       Wendall Papa, MD 07/24/13 0004

## 2013-07-23 NOTE — ED Notes (Signed)
Pt placed on 02 via [place] at 3LPM

## 2013-07-23 NOTE — Discharge Instructions (Signed)
Angioedema Angioedema is a sudden swelling of tissues, often of the skin. It can occur on the face or genitals or in the abdomen or other body parts. The swelling usually develops over a short period and gets better in 24 to 48 hours. It often begins during the night and is found when the person wakes up. The person may also get red, itchy patches of skin (hives). Angioedema can be dangerous if it involves swelling of the air passages.  Depending on the cause, episodes of angioedema may only happen once, come back in unpredictable patterns, or repeat for several years and then gradually fade away.  CAUSES  Angioedema can be caused by an allergic reaction to various triggers. It can also result from nonallergic causes, including reactions to drugs, immune system disorders, viral infections, or an abnormal gene that is passed to you from your parents (hereditary). For some people with angioedema, the cause is unknown.  Some things that can trigger angioedema include:   Foods.   Medicines, such as ACE inhibitors, ARBs, nonsteroidal anti-inflammatory agents, or estrogen.   Latex.   Animal saliva.   Insect stings.   Dyes used in X-rays.   Mild injury.   Dental work.  Surgery.  Stress.   Sudden changes in temperature.   Exercise. SIGNS AND SYMPTOMS   Swelling of the skin.  Hives. If these are present, there is also intense itching.  Redness in the affected area.   Pain in the affected area.  Swollen lips or tongue.  Breathing problems. This may happen if the air passages swell.  Wheezing. If internal organs are involved, there may be:   Nausea.   Abdominal pain.   Vomiting.   Difficulty swallowing.   Difficulty passing urine. DIAGNOSIS   Your health care provider will examine the affected area and take a medical and family history.  Various tests may be done to help determine the cause. Tests may include:  Allergy skin tests to see if the problem  is an allergic reaction.   Blood tests to check for hereditary angioedema.   Tests to check for underlying diseases that could cause the condition.   A review of your medicines, including over the counter medicines, may be done. TREATMENT  Treatment will depend on the cause of the angioedema. Possible treatments include:   Removal of anything that triggered the condition (such as stopping certain medicines).   Medicines to treat symptoms or prevent attacks. Medicines given may include:   Antihistamines.   Epinephrine injection.   Steroids.   Hospitalization may be required for severe attacks. If the air passages are affected, it can be an emergency. Tubes may need to be placed to keep the airway open. HOME CARE INSTRUCTIONS   Only take over-the-counter or prescription medicines as directed by your health care provider.  If you were given medicines for emergency allergy treatment, always carry them with you.  Wear a medical bracelet as directed by your health care provider.   Avoid known triggers. SEEK MEDICAL CARE IF:   You have repeat attacks of angioedema.   Your attacks are more frequent or more severe despite preventive measures.   You have hereditary angioedema and are considering having children. It is important to discuss the risks of passing the condition on to your children with your health care provider. SEEK IMMEDIATE MEDICAL CARE IF:   You have severe swelling of the mouth, tongue, or lips.  You have difficulty breathing.   You have difficulty swallowing.     You faint. MAKE SURE YOU:  Understand these instructions.  Will watch your condition.  Will get help right away if you are not doing well or get worse. Document Released: 06/14/2001 Document Revised: 01/24/2013 Document Reviewed: 11/27/2012 [employer] Patient Information 2014 [employer].  

## 2013-07-30 ENCOUNTER — Encounter: Payer: Self-pay | Admitting: Internal Medicine

## 2013-07-30 ENCOUNTER — Ambulatory Visit (INDEPENDENT_AMBULATORY_CARE_PROVIDER_SITE_OTHER): Payer: Medicaid Other | Admitting: Internal Medicine

## 2013-07-30 ENCOUNTER — Ambulatory Visit: Payer: Medicaid Other | Admitting: Family Medicine

## 2013-07-30 VITALS — BP 119/85 | HR 67 | Temp 98.1°F | Ht 60.0 in | Wt 208.2 lb

## 2013-07-30 DIAGNOSIS — R221 Localized swelling, mass and lump, neck: Secondary | ICD-10-CM

## 2013-07-30 DIAGNOSIS — R220 Localized swelling, mass and lump, head: Secondary | ICD-10-CM | POA: Insufficient documentation

## 2013-07-30 LAB — HEPATIC FUNCTION PANEL
ALK PHOS: 112 U/L (ref 39–117)
ALT: 10 U/L (ref 0–35)
AST: 13 U/L (ref 0–37)
Albumin: 3.4 g/dL — ABNORMAL LOW (ref 3.5–5.2)
Total Bilirubin: <0.2 mg/dL — ABNORMAL LOW (ref 0.3–1.2)
Total Protein: 6.9 g/dL (ref 6.0–8.3)

## 2013-07-30 LAB — SEDIMENTATION RATE: Sed Rate: 17 mm/hr (ref 0–22)

## 2013-07-30 LAB — C-REACTIVE PROTEIN: CRP: 1.3 mg/dL — ABNORMAL HIGH (ref <0.60)

## 2013-07-30 MED ORDER — DESLORATADINE 5 MG PO TABS: 5.0000 mg | ORAL_TABLET | Freq: Two times a day (BID) | ORAL | Status: DC

## 2013-07-30 NOTE — Patient Instructions (Signed)
Take all the medications as recommended below. Follow up with Allergy specialist as recommended.  If your symptoms worsen or do not improve, call us or seek immediate medical help.

## 2013-07-30 NOTE — Assessment & Plan Note (Addendum)
Currently unclear etiology. With history of recurrent episodes, patient needs a full work up and a referral to allergy/immunology.  No known exposures/ food/activities or other ingestions related to any of these episodes. No new medications as per patient.  Given recurrent mucosal and cutaneous angioedema, hereditary and acquired C1 inhibitor deficiency is a possible differential.  Discussed with the attending regarding further work up.  Plans Check ESR, CRP Check C3, C4  Check C1 inhibitor Check LFT's Allergy/Immunology referral Increase desloratidine to 18m BID EPIPEN as needed.

## 2013-07-30 NOTE — Progress Notes (Signed)
Subjective:   Patient ID: Maria Nelson female   DOB: 05-11-1955 58 y.o.   MRN: [id number]  HPI: [name] is a 58 y.o. with PMH significant for  Asthma, COPD comes to the office with CC of swelling of left lower lip and "hives" over the right arm since this morning.   Patient reports that she woke up at 9 AM today feeling well. She took her medications and ate hamburger. While she was watching TV at around 10 AM, she noticed tingling sensation, swelling and pain in the lower lip. She started noticing "hives" over the right medial aspect of upper arm. She also reports that she noticed rash over her left elbow area and left shoulder areas. She reports that the rash very itchy and has been using hydroxyzine as needed.   She states that she has these recurrent episodes of facial swelling and hives for the last few years and hasn't been able to figure out the reasons for it. She was sent to the [facility] in 2010 for the work up and she doesn't remember undergoing any tests. She reports that she usually has an episode atleast once every two weeks and sometimes twice a week. She doesn't know if she is allergic to any medications that she is taking or if she is allergic to any food. She reports that her symptoms are not related to any season. Interestingly, she states that her facial swelling is mostly Unilateral. She reports that she notices only the right half of the tongue being sometimes. She has had an ED visit on 07/23/2013 for right upper lip swelling and was treated successfully with Benadryl and Dexamethasone. She has had Xrays of the neck revealing thickening of the pre-vertebral soft tissue without signs of epiglottitis.   She denies any fever, chills, body pains, runny nose, SOB. She denies any other symptoms.   Past Medical History  Diagnosis Date  . COPD (chronic obstructive pulmonary disease)     secondary to tobacco use // No PFTs on file  . Gastrointestinal  hemorrhage     secondary to PUD on March 2008  . Peptic ulcer disease     +H. pylori (antigen)- Dr. Olevia Perches- treated  . ETOH abuse     Pt stopped in 3/08  . Tobacco abuse     quit in 2010  . OSA on CPAP     noncompliant with CPAP (2/2 it being depressing)  . Urinary incontinence   . Carpal tunnel syndrome 06/23/2011    Patient notes history of bilateral carpal tunnel syndrome.  Now has symptoms of right hand carpal tunnel.   . 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 STRESS INCONTINENCE 01/24/2007   Current Outpatient Prescriptions  Medication Sig Dispense Refill  . albuterol (PROVENTIL HFA;VENTOLIN HFA) 108 (90 BASE) MCG/ACT inhaler Inhale 2 puffs into the lungs every 6 (six) hours as needed for wheezing.      [name] albuterol (PROVENTIL) (2.5 MG/3ML) 0.083% nebulizer solution Take 3 mLs (2.5 mg total) by nebulization every 6 (six) hours as needed for wheezing.  75 mL  12  . albuterol (PROVENTIL) (5 MG/ML) 0.5% nebulizer solution Take 0.5 mLs (2.5 mg total) by nebulization every 6 (six) hours.  20 mL  12  . desloratadine (CLARINEX) 5 MG tablet Take 5 mg by mouth daily.      . diclofenac sodium (VOLTAREN) 1 % GEL Apply 1 application topically 4 (four) times daily.  1 Tube  1  . EPINEPHrine (EPIPEN) 0.3 mg/0.3 mL SOAJ Inject  0.3 mLs (0.3 mg total) into the skin once as needed.  1 Device  6  . Fluticasone-Salmeterol (ADVAIR) 500-50 MCG/DOSE AEPB Inhale 1 puff into the lungs 2 (two) times daily.  180 each  3  . gabapentin (NEURONTIN) 300 MG capsule Take 300 mg by mouth at bedtime.      . hydrOXYzine (VISTARIL) 25 MG capsule Take 4 capsules (100 mg total) by mouth once as needed for itching (facial swelling).  30 capsule  4  . ipratropium (ATROVENT) 0.02 % nebulizer solution Take 2.5 mLs (500 mcg total) by nebulization 4 (four) times daily as needed (shortness of breath.).  75 mL  12  . meloxicam (MOBIC) 7.5 MG tablet Take 1-2 tablets (7.5-15 mg total) by mouth daily.  30 tablet  1  . pantoprazole (PROTONIX) 40 MG  tablet Take 1 tablet (40 mg total) by mouth daily.  30 tablet  11  . polyethylene glycol (MIRALAX / GLYCOLAX) packet Take 17 g by mouth daily as needed for mild constipation.       No current facility-administered medications for this visit.   Family History  Problem Relation Age of Onset  . Diabetes Sister   . Breast cancer Sister 67    died 2/2 breast ca age 58-60  . Diabetes Mother   . Coronary artery disease Mother 63    requiring 5 vessel CABG   History   Social History  . Marital Status: Single    Spouse Name: N/A    Number of Children: 3  . Years of Education: 12th grade   Occupational History  . Disability     previously worked in [employer] had to stop 2/2 occupational exposures leading to exacerbations of asthma    Social History Main Topics  . Smoking status: Former Smoker -- 0.40 packs/day    Types: Cigarettes    Quit date: 12/04/2010  . Smokeless tobacco: Not on file     Comment: stopped 3 weeks ago  . Alcohol Use: Yes     Comment: now drinking a beer on weekends //   . Drug Use: No  . Sexual Activity: Not on file   Other Topics Concern  . Not on file   Social History Narrative   Pt is not working now, was previously a Scientist, water quality at [employer].   Pt lives with her mother      Pt has received financial assistance approval for 100% discount at [facility] and has [facility] card.    Review of Systems: Pertinent items are noted in HPI. Objective:  Physical Exam: Filed Vitals:   07/30/13 1518  BP: 119/85  Pulse: 67  Temp: 98.1 F (36.7 C)  TempSrc: Oral  Height: 5' (1.524 m)  Weight: 208 lb 3.2 oz (94.439 kg)  SpO2: 97%   Constitutional: Vital signs reviewed.  Patient is a well-developed and well-nourished and is in no acute distress and cooperative with exam. Alert and oriented x3.  Head: Normocephalic and atraumatic. Swelling of the left half of the lower lip noted. No signs of excoriations noted.  Ear: TM normal bilaterally Nose: No erythema  or drainage noted.  Turbinates normal Mouth: no erythema or exudates, MMM Eyes: PERRL, EOMI, conjunctivae normal, No scleral icterus.  Neck: Supple, Trachea midline normal ROM, No JVD, mass, thyromegaly, or carotid bruit present.  Cardiovascular: RRR, S1 normal, S2 normal. Pulmonary/Chest: normal respiratory effort, CTAB, no wheezes, rales, or rhonchi Musculoskeletal: No joint deformities, erythema, or stiffness, ROM full and no nontender  Neurological: A&O x3 Skin: Maculo papular rash noted over the medial aspect of the right upper arm extending from the anterior axillary fold to midway through the upper arm. Signs of excoriation noted. There is also small 1x1 inch maculopapular rash noted over the left elbow and over the left shoulder.  Psychiatric: Normal mood and affect. speech and behavior is normal. Judgment and thought content normal. Cognition and memory are normal.    Assessment & Plan:

## 2013-07-31 LAB — C3 AND C4
C3 Complement: 156 mg/dL (ref 90–180)
C4 Complement: 35 mg/dL (ref 10–40)

## 2013-07-31 NOTE — Progress Notes (Signed)
INTERNAL MEDICINE TEACHING ATTENDING ADDENDUM - Lenny Bouchillon, MD: I reviewed and discussed at the time of visit with the resident Dr. Boggala, the patient's medical history, physical examination, diagnosis and results of tests and treatment and I agree with the patient's care as documented. 

## 2013-08-02 LAB — C1 ESTERASE INHIBITOR, FUNCTIONAL: C1 ESTERASE INH FUNCT: 95 % (ref >=68)

## 2013-08-03 ENCOUNTER — Telehealth: Payer: Self-pay | Admitting: *Deleted

## 2013-08-03 ENCOUNTER — Other Ambulatory Visit: Payer: Self-pay | Admitting: Internal Medicine

## 2013-08-03 DIAGNOSIS — R220 Localized swelling, mass and lump, head: Secondary | ICD-10-CM

## 2013-08-03 MED ORDER — CETIRIZINE HCL 10 MG PO CAPS
10.0000 mg | ORAL_CAPSULE | Freq: Once | ORAL | Status: DC
Start: 2013-08-03 — End: 2014-08-16

## 2013-08-03 NOTE — Telephone Encounter (Signed)
Patient is requesting for Zyrtec instead of Clarinex.

## 2013-08-03 NOTE — Telephone Encounter (Signed)
Received faxed prior authorization request from pt's pharmacy [facility]) for her desloratadine (clarinex)  tabs.  Medication is non-preferred.  Preferred meds include the following : cetirizine (Zyrtec) and loratadine (Claritin).  Pt was contacted and made aware that medicaid would not pay for the clarinex and she stated that she had used zyrtec in the past and if helped to control her symptoms.  Will forward info to prescribing MD for chart review and to remove clarinex rx and send new rx to pharmacy.  Please advise.Maria C Goldston4/17/20151:48 PM

## 2013-08-03 NOTE — Telephone Encounter (Signed)
Will stop Clarinex and send a prescription for Zyrtec.

## 2013-08-10 ENCOUNTER — Ambulatory Visit (INDEPENDENT_AMBULATORY_CARE_PROVIDER_SITE_OTHER): Payer: Medicaid Other | Admitting: Family Medicine

## 2013-08-10 ENCOUNTER — Encounter: Payer: Self-pay | Admitting: Family Medicine

## 2013-08-10 VITALS — BP 130/88 | Ht 60.0 in | Wt 200.0 lb

## 2013-08-10 DIAGNOSIS — IMO0002 Reserved for concepts with insufficient information to code with codable children: Secondary | ICD-10-CM

## 2013-08-10 DIAGNOSIS — M5416 Radiculopathy, lumbar region: Secondary | ICD-10-CM

## 2013-08-10 NOTE — Patient Instructions (Signed)
Thank you for coming in today  1. Continue miralax as needed only for constipation 2. No meds needed for your back. We are glad you're doing so much better 3. Followup as needed

## 2013-08-10 NOTE — Progress Notes (Signed)
CC: Followup lumbar radiculopathy HPI: Patient returns for followup today. Unfortunately she is doing very well. She is also better. She denies  any radicular pain, numbness, back pain, leg weakness, bowel or bladder troubles. She is on the MiraLAX which has helped her constipation. She did have to stop the gabapentin because she had an episode of facial swelling and feeling like her throat was swollen. She has an upcoming appointment with an allergist. She is currently off all medications for her back and is doing very well.  ROS: As above in the HPI. All other systems are stable or negative.  OBJECTIVE: APPEARANCE:  Patient in no acute distress.The patient appeared well nourished and normally developed. HEENT: No scleral icterus. Conjunctiva non-injected Resp: Non labored Skin: No rash MSK:  Low back exam: - Full range of motion in flexion, extension, lateral bending, rotation without pain  - No tenderness to palpation over the spinous processes of the lumbar vertebra - No tenderness to palpation - No tenderness to palpation at the SI joint or sciatic notch - Negative straight leg raise - Strength 5 out of 5 in the bilateral lower extremities  MSK [place]: Not performed   ASSESSMENT: #1. Resolved lumbar radiculopathy   PLAN: We are glad to see she is doing so much better. We will see her back on an as-needed basis.

## 2013-08-22 ENCOUNTER — Ambulatory Visit (INDEPENDENT_AMBULATORY_CARE_PROVIDER_SITE_OTHER): Payer: Medicaid Other | Admitting: Internal Medicine

## 2013-08-22 ENCOUNTER — Encounter: Payer: Self-pay | Admitting: Internal Medicine

## 2013-08-22 ENCOUNTER — Other Ambulatory Visit (HOSPITAL_COMMUNITY)
Admission: RE | Admit: 2013-08-22 | Discharge: 2013-08-22 | Disposition: A | Discharge status: Home / Self Care | Payer: Medicaid Other | Source: Ambulatory Visit | Attending: Internal Medicine | Admitting: Internal Medicine

## 2013-08-22 VITALS — BP 124/82 | HR 96 | Temp 99.0°F | Ht 60.0 in | Wt 210.4 lb

## 2013-08-22 DIAGNOSIS — Z124 Encounter for screening for malignant neoplasm of cervix: Secondary | ICD-10-CM

## 2013-08-22 DIAGNOSIS — Z01419 Encounter for gynecological examination (general) (routine) without abnormal findings: Secondary | ICD-10-CM | POA: Insufficient documentation

## 2013-08-22 DIAGNOSIS — J449 Chronic obstructive pulmonary disease, unspecified: Secondary | ICD-10-CM

## 2013-08-22 DIAGNOSIS — Z0000 Encounter for general adult medical examination without abnormal findings: Secondary | ICD-10-CM

## 2013-08-22 NOTE — Assessment & Plan Note (Signed)
Pt has no dyspnea today, still on a prednisone exacerbation for an acute flare.  Advair has been changed to [facility] per Allergy/Immunology. -continue Dulera -albuterol prn

## 2013-08-22 NOTE — Addendum Note (Signed)
Addended by: Hester Mates on: 08/22/2013 02:56 PM   Modules accepted: Orders

## 2013-08-22 NOTE — Progress Notes (Signed)
HPI The patient is a 58 y.o. female with a history of OSA, COPD, PUD, presenting for a pap smear.  The patient presents for a visit for a pap smear.  Her last pap smear was unremarkable, 07/2012.  She has never had cervical cancer or an abnormal pap smear.  No family history of cervical cancer.  She notes no vaginal pain, discharge, or itching.  The patient went to see the Allergist/Immunologist this week, for recurrent angioedema, who switched her Advair to [facility].  She was having difficulty breathing at the time, thought to be a COPD flare, and was given a prednisone taper.  She has a follow-up appointment 5/19, for allergy testing.  Patient notes no difficulty breathing today  ROS: General: no fevers, chills, changes in weight, changes in appetite Skin: no rash HEENT: no blurry vision, hearing changes, sore throat Pulm: see HPI CV: no chest pain, palpitations, shortness of breath Abd: no abdominal pain, nausea/vomiting, diarrhea/constipation GU: no dysuria, hematuria, polyuria Ext: no arthralgias, myalgias Neuro: no weakness, numbness, or tingling  Filed Vitals:   08/22/13 1338  BP: 124/82  Pulse: 96  Temp: 99 F (37.2 C)    PEX General: alert, cooperative, and in no apparent distress HEENT: pupils equal round and reactive to light, vision grossly intact, oropharynx clear and non-erythematous  Neck: supple Lungs: clear to ascultation bilaterally today, normal work of respiration, no wheezes, rales, ronchi Heart: regular rate and rhythm, no murmurs, gallops, or rubs Abdomen: soft, non-tender, non-distended, normal bowel sounds Pelvic: External labia unremarkable, vaginal vault with no excoriations and only scant white exudate, cervical os unremarkable. Extremities: no cyanosis, clubbing, or edema Neurologic: alert & oriented X3, cranial nerves II-XII intact, strength grossly intact, sensation intact to light touch  Current Outpatient Prescriptions on File Prior to Visit   Medication Sig Dispense Refill  . albuterol (PROVENTIL HFA;VENTOLIN HFA) 108 (90 BASE) MCG/ACT inhaler Inhale 2 puffs into the lungs every 6 (six) hours as needed for wheezing.      [name] albuterol (PROVENTIL) (2.5 MG/3ML) 0.083% nebulizer solution Take 3 mLs (2.5 mg total) by nebulization every 6 (six) hours as needed for wheezing.  75 mL  12  . albuterol (PROVENTIL) (5 MG/ML) 0.5% nebulizer solution Take 0.5 mLs (2.5 mg total) by nebulization every 6 (six) hours.  20 mL  12  . Cetirizine HCl 10 MG CAPS Take 1 capsule (10 mg total) by mouth once.  30 capsule  0  . diclofenac sodium (VOLTAREN) 1 % GEL Apply 1 application topically 4 (four) times daily.  1 Tube  1  . EPINEPHrine (EPIPEN) 0.3 mg/0.3 mL SOAJ Inject 0.3 mLs (0.3 mg total) into the skin once as needed.  1 Device  6  . Fluticasone-Salmeterol (ADVAIR) 500-50 MCG/DOSE AEPB Inhale 1 puff into the lungs 2 (two) times daily.  180 each  3  . gabapentin (NEURONTIN) 300 MG capsule Take 300 mg by mouth at bedtime.      . hydrOXYzine (VISTARIL) 25 MG capsule Take 4 capsules (100 mg total) by mouth once as needed for itching (facial swelling).  30 capsule  4  . ipratropium (ATROVENT) 0.02 % nebulizer solution Take 2.5 mLs (500 mcg total) by nebulization 4 (four) times daily as needed (shortness of breath.).  75 mL  12  . meloxicam (MOBIC) 7.5 MG tablet Take 1-2 tablets (7.5-15 mg total) by mouth daily.  30 tablet  1  . pantoprazole (PROTONIX) 40 MG tablet Take 1 tablet (40 mg total) by mouth daily.  30 tablet  11  . polyethylene glycol (MIRALAX / GLYCOLAX) packet Take 17 g by mouth daily as needed for mild constipation.       No current facility-administered medications on file prior to visit.    Assessment/Plan

## 2013-08-22 NOTE — Assessment & Plan Note (Signed)
PAP smear performed today.  The patient is amenable to lengthening further PAP smears to every 3 years (next due May 2018).

## 2013-08-22 NOTE — Patient Instructions (Signed)
General Instructions: We performed a pap smear today, and should have the results within the next 1 week.  If you do not hear from [place], you may assume the results were normal.  If anything is abnormal, I will give you a call. -since you have had several normal pap smears, we can screen you with pap smears up to every 3 years from this point forward (next due May 2018).  Please return for a follow-up visit in 6 months, or sooner if needed.  Treatment Goals:  Goals (1 Years of Data) as of 08/22/13   None      Progress Toward Treatment Goals:  No flowsheet data found.  Self Care Goals & Plans:  No flowsheet data found.  No flowsheet data found.   Care Management & Community Referrals:  Referral 02/14/2013  Referrals made to community resources none

## 2013-08-23 NOTE — Progress Notes (Signed)
Case discussed with Dr. Brown at the time of the visit.  We reviewed the resident's history and exam and pertinent patient test results.  I agree with the assessment, diagnosis, and plan of care documented in the resident's note. 

## 2013-09-06 ENCOUNTER — Other Ambulatory Visit: Payer: Self-pay | Admitting: Internal Medicine

## 2013-09-25 ENCOUNTER — Other Ambulatory Visit (HOSPITAL_COMMUNITY): Payer: Self-pay | Admitting: Internal Medicine

## 2014-01-03 ENCOUNTER — Encounter: Payer: Self-pay | Admitting: Gastroenterology

## 2014-02-23 ENCOUNTER — Encounter (HOSPITAL_COMMUNITY): Payer: Self-pay | Admitting: Nurse Practitioner

## 2014-02-23 ENCOUNTER — Emergency Department (HOSPITAL_COMMUNITY)
Admission: EM | Admit: 2014-02-23 | Discharge: 2014-02-23 | Disposition: A | Discharge status: Home / Self Care | Payer: Medicaid Other | Attending: Emergency Medicine | Admitting: Emergency Medicine

## 2014-02-23 ENCOUNTER — Emergency Department (HOSPITAL_COMMUNITY): Payer: Medicaid Other

## 2014-02-23 DIAGNOSIS — Z9981 Dependence on supplemental oxygen: Secondary | ICD-10-CM | POA: Insufficient documentation

## 2014-02-23 DIAGNOSIS — Z8719 Personal history of other diseases of the digestive system: Secondary | ICD-10-CM | POA: Insufficient documentation

## 2014-02-23 DIAGNOSIS — Z8711 Personal history of peptic ulcer disease: Secondary | ICD-10-CM | POA: Diagnosis not present

## 2014-02-23 DIAGNOSIS — Z87891 Personal history of nicotine dependence: Secondary | ICD-10-CM | POA: Insufficient documentation

## 2014-02-23 DIAGNOSIS — Z87448 Personal history of other diseases of urinary system: Secondary | ICD-10-CM | POA: Diagnosis not present

## 2014-02-23 DIAGNOSIS — Z7951 Long term (current) use of inhaled steroids: Secondary | ICD-10-CM | POA: Insufficient documentation

## 2014-02-23 DIAGNOSIS — J441 Chronic obstructive pulmonary disease with (acute) exacerbation: Secondary | ICD-10-CM | POA: Diagnosis not present

## 2014-02-23 DIAGNOSIS — G4733 Obstructive sleep apnea (adult) (pediatric): Secondary | ICD-10-CM | POA: Diagnosis not present

## 2014-02-23 DIAGNOSIS — R0602 Shortness of breath: Secondary | ICD-10-CM

## 2014-02-23 DIAGNOSIS — Z791 Long term (current) use of non-steroidal anti-inflammatories (NSAID): Secondary | ICD-10-CM | POA: Diagnosis not present

## 2014-02-23 LAB — BASIC METABOLIC PANEL
Anion gap: 12 (ref 5–15)
BUN: 13 mg/dL (ref 6–23)
CO2: 27 mEq/L (ref 19–32)
Calcium: 9.9 mg/dL (ref 8.4–10.5)
Chloride: 104 mEq/L (ref 96–112)
Creatinine, Ser: 1.02 mg/dL (ref 0.50–1.10)
GFR calc Af Amer: 69 mL/min — ABNORMAL LOW (ref >90)
GFR, EST NON AFRICAN AMERICAN: 59 mL/min — AB (ref >90)
Glucose, Bld: 91 mg/dL (ref 70–99)
Potassium: 4.2 mEq/L (ref 3.7–5.3)
Sodium: 143 mEq/L (ref 137–147)

## 2014-02-23 LAB — CBC
HEMATOCRIT: 42.8 % (ref 36.0–46.0)
Hemoglobin: 13.5 g/dL (ref 12.0–15.0)
MCH: 25.5 pg — ABNORMAL LOW (ref 26.0–34.0)
MCHC: 31.5 g/dL (ref 30.0–36.0)
MCV: 80.9 fL (ref 78.0–100.0)
Platelets: 243 10*3/uL (ref 150–400)
RBC: 5.29 MIL/uL — ABNORMAL HIGH (ref 3.87–5.11)
RDW: 13.6 % (ref 11.5–15.5)
WBC: 8.1 10*3/uL (ref 4.0–10.5)

## 2014-02-23 LAB — I-STAT TROPONIN, ED: Troponin i, poc: 0.01 ng/mL (ref 0.00–0.08)

## 2014-02-23 MED ORDER — IPRATROPIUM BROMIDE 0.02 % IN SOLN
0.5000 mg | Freq: Once | RESPIRATORY_TRACT | Status: AC
  Administered 2014-02-23: 0.5 mg via RESPIRATORY_TRACT
  Filled 2014-02-23: qty 2.5

## 2014-02-23 MED ORDER — METHYLPREDNISOLONE SODIUM SUCC 125 MG IJ SOLR
125.0000 mg | Freq: Once | INTRAMUSCULAR | Status: AC
  Administered 2014-02-23: 125 mg via INTRAVENOUS
  Filled 2014-02-23: qty 2

## 2014-02-23 MED ORDER — ALBUTEROL (5 MG/ML) CONTINUOUS INHALATION SOLN
15.0000 mg/h | INHALATION_SOLUTION | RESPIRATORY_TRACT | Status: AC
  Administered 2014-02-23: 15 mg/h via RESPIRATORY_TRACT
  Filled 2014-02-23: qty 20

## 2014-02-23 MED ORDER — GUAIFENESIN ER 1200 MG PO TB12
1.0000 | ORAL_TABLET | Freq: Two times a day (BID) | ORAL | Status: DC
Start: 2014-02-23 — End: 2014-11-15

## 2014-02-23 MED ORDER — PREDNISONE 50 MG PO TABS: 50.0000 mg | ORAL_TABLET | Freq: Every day | ORAL | Status: DC

## 2014-02-23 NOTE — ED Notes (Signed)
She states shes felt SOB and had pains in her chest since yesterday. States "i took all my medicine like they told me and tried my inhaler and breathing treatments but i still feel SOB." pt is A&Ox4, able to speak in complete sentences

## 2014-02-23 NOTE — ED Provider Notes (Signed)
CSN: [id number]     Arrival date & time 02/23/14  1707 History   First MD Initiated Contact with Patient 02/23/14 1804     Chief Complaint  Patient presents with  . Shortness of Breath     (Consider location/radiation/quality/duration/timing/severity/associated sxs/prior Treatment) HPI Patient presents to the emergency department with an exacerbation of her COPD the patient states that her mother was cooking fish in the house and her house with smoking and this kicked off her breathing issues.  The patient states that she was using her inhalers as directed.  He denies chest pain, nausea, vomiting, abdominal pain, headache, blurred vision, weakness, numbness, dizziness, back pain, neck pain, fever, anemia, no sore throat, cough, lightheadedness, anorexia, near syncope or syncope.  The patient states that this happens whenever her mom cooks fish and there is a lot of smoke in the house Past Medical History  Diagnosis Date  . COPD (chronic obstructive pulmonary disease)     secondary to tobacco use // No PFTs on file  . Gastrointestinal hemorrhage     secondary to PUD on March 2008  . Peptic ulcer disease     +H. pylori (antigen)- Dr. Olevia Perches- treated  . ETOH abuse     Pt stopped in 3/08  . Tobacco abuse     quit in 2010  . OSA on CPAP     noncompliant with CPAP (2/2 it being depressing)  . Urinary incontinence   . Carpal tunnel syndrome 06/23/2011    Patient notes history of bilateral carpal tunnel syndrome.  Now has symptoms of right hand carpal tunnel.   . 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 STRESS INCONTINENCE 01/24/2007   Past Surgical History  Procedure Laterality Date  . Tubal ligation    . Mandible surgery      due to trauma   Family History  Problem Relation Age of Onset  . Diabetes Sister   . Breast cancer Sister 2    died 2/2 breast ca age 74-60  . Diabetes Mother   . Coronary artery disease Mother 20    requiring 5 vessel CABG   History  Substance Use Topics  . Smoking  status: Former Smoker -- 0.40 packs/day    Types: Cigarettes    Quit date: 12/04/2010  . Smokeless tobacco: Not on file     Comment: stopped 3 weeks ago  . Alcohol Use: Yes     Comment: now drinking a beer on weekends //    OB History    No data available     Review of Systems  All other systems negative except as documented in the HPI. All pertinent positives and negatives as reviewed in the HPI.   Allergies  Bee venom and Aspirin  Home Medications   Prior to Admission medications   Medication Sig Start Date End Date Taking? Authorizing Provider  albuterol (PROVENTIL HFA;VENTOLIN HFA) 108 (90 BASE) MCG/ACT inhaler Inhale 2 puffs into the lungs every 6 (six) hours as needed for wheezing.   Yes Historical Provider, MD  desloratadine (CLARINEX) 5 MG tablet Take 5 mg by mouth daily.   Yes Historical Provider, MD  ipratropium-albuterol (DUONEB) 0.5-2.5 (3) MG/3ML SOLN Take 3 mLs by nebulization every 4 (four) hours as needed (wheezing/shortness of breath).   Yes Historical Provider, MD  mometasone-formoterol (DULERA) 200-5 MCG/ACT AERO Inhale 2 puffs into the lungs 2 (two) times daily.   Yes Historical Provider, MD  montelukast (SINGULAIR) 10 MG tablet Take 10 mg by mouth at bedtime.  Yes Historical Provider, MD  pantoprazole (PROTONIX) 40 MG tablet Take 1 tablet (40 mg total) by mouth daily. 06/06/12  Yes Hester Mates, MD  albuterol (PROVENTIL) (2.5 MG/3ML) 0.083% nebulizer solution Use 1 vial in nebulizer every 1 hours as needed for wheezing Patient not taking: Reported on 02/23/2014 09/07/13   Hester Mates, MD  albuterol (PROVENTIL) (5 MG/ML) 0.5% nebulizer solution Take 0.5 mLs (2.5 mg total) by nebulization every 6 (six) hours. Patient not taking: Reported on 02/23/2014 09/11/12 07/23/13  Otho Bellows, MD  Cetirizine HCl 10 MG CAPS Take 1 capsule (10 mg total) by mouth once. Patient not taking: Reported on 02/23/2014 08/03/13   Malena Catholic, MD  diclofenac sodium (VOLTAREN)  1 % GEL Apply 1 application topically 4 (four) times daily. Patient not taking: Reported on 02/23/2014 12/27/12   Hennie Duos, MD  hydrOXYzine (VISTARIL) 25 MG capsule Take 4 capsules (100 mg total) by mouth once as needed for itching (facial swelling). Patient not taking: Reported on 02/23/2014 05/10/13   Hester Mates, MD  ipratropium (ATROVENT) 0.02 % nebulizer solution Take 2.5 mLs (500 mcg total) by nebulization 4 (four) times daily as needed (shortness of breath.). Patient not taking: Reported on 02/23/2014 08/09/12 08/09/13  Hester Mates, MD  ipratropium (ATROVENT) 0.02 % nebulizer solution USE ONE VIAL BY NEBULIZER EVERY 6 HOURS Patient not taking: Reported on 02/23/2014    Hester Mates, MD  meloxicam (MOBIC) 7.5 MG tablet Take 1-2 tablets (7.5-15 mg total) by mouth daily. Patient not taking: Reported on 02/23/2014 04/16/13 04/16/14  Hester Mates, MD   BP 110/84 mmHg  Pulse 77  Temp(Src) 98.2 F (36.8 C) (Oral)  Resp 21  Ht 5' (1.524 m)  Wt 200 lb (90.719 kg)  BMI 39.06 kg/m2  SpO2 100% Physical Exam  Constitutional: She is oriented to person, place, and time. She appears well-developed and well-nourished. No distress.  HENT:  Head: Normocephalic and atraumatic.  Mouth/Throat: Oropharynx is clear and moist.  Eyes: Pupils are equal, round, and reactive to light.  Neck: Normal range of motion. Neck supple. No thyromegaly present.  Cardiovascular: Normal rate, regular rhythm and normal heart sounds.  Exam reveals no gallop and no friction rub.   No murmur heard. Pulmonary/Chest: Effort normal. No respiratory distress. She has wheezes. She has no rales.  Musculoskeletal: She exhibits no edema.  Neurological: She is alert and oriented to person, place, and time. She exhibits normal muscle tone. Coordination normal.  Skin: Skin is warm and dry. No rash noted. No erythema.  Psychiatric: She has a normal mood and affect. Her behavior is normal.  Nursing note and vitals reviewed.   ED  Course  Procedures (including critical care time) Labs Review Labs Reviewed  CBC - Abnormal; Notable for the following:    RBC 5.29 (*)    MCH 25.5 (*)    All other components within normal limits  BASIC METABOLIC PANEL - Abnormal; Notable for the following:    GFR calc non Af Amer 59 (*)    GFR calc Af Amer 69 (*)    All other components within normal limits  PRO B NATRIURETIC PEPTIDE  I-STAT TROPOININ, ED    Imaging Review Dg Chest 2 View  02/23/2014   CLINICAL DATA:  Shortness of breath, cough, mid sternal chest pain. Reported history of asthma and COPD.  EXAM: CHEST  2 VIEW  COMPARISON:  09/08/2012  FINDINGS: The heart size is at upper limits of  normal. Flattening of the hemidiaphragms suggest emphysema. Both lungs are clear. The visualized skeletal structures are unremarkable.  IMPRESSION: No focal acute finding. Hemidiaphragmatic flattening suggests emphysema.   Electronically Signed   By: Conchita Paris M.D.   On: 02/23/2014 17:48     EKG Interpretation None      MDM   Final diagnoses:  SOB (shortness of breath)     The patient is feeling vastly improved following an hour-long neb treatment with steroids.  The patient ambulated about the department with pulse oximetry attached and she did not drop below 96% on room air.  The patient feels like she would like to go home and is advised to return here for any worsening in her condition.  Also advised her to follow-up with her primary care doctor.  Patient agrees to plan.  All questions were answered.  The patient was rechecked [place], PA-C 02/24/14 0108  Carmin Muskrat, MD 02/24/14 [phone]

## 2014-02-23 NOTE — Discharge Instructions (Signed)
Return here as needed. Follow up with your doctor. °

## 2014-02-26 ENCOUNTER — Encounter: Payer: Medicaid Other | Admitting: Internal Medicine

## 2014-03-05 ENCOUNTER — Ambulatory Visit (INDEPENDENT_AMBULATORY_CARE_PROVIDER_SITE_OTHER): Payer: Medicaid Other | Admitting: Internal Medicine

## 2014-03-05 ENCOUNTER — Encounter: Payer: Self-pay | Admitting: Internal Medicine

## 2014-03-05 VITALS — BP 140/85 | HR 67 | Temp 98.0°F | Ht 60.0 in | Wt 214.4 lb

## 2014-03-05 DIAGNOSIS — R030 Elevated blood-pressure reading, without diagnosis of hypertension: Secondary | ICD-10-CM

## 2014-03-05 DIAGNOSIS — I10 Essential (primary) hypertension: Secondary | ICD-10-CM | POA: Insufficient documentation

## 2014-03-05 DIAGNOSIS — J449 Chronic obstructive pulmonary disease, unspecified: Secondary | ICD-10-CM | POA: Insufficient documentation

## 2014-03-05 DIAGNOSIS — IMO0001 Reserved for inherently not codable concepts without codable children: Secondary | ICD-10-CM | POA: Insufficient documentation

## 2014-03-05 DIAGNOSIS — Z23 Encounter for immunization: Secondary | ICD-10-CM

## 2014-03-05 DIAGNOSIS — J45909 Unspecified asthma, uncomplicated: Secondary | ICD-10-CM

## 2014-03-05 HISTORY — DX: Elevated blood-pressure reading, without diagnosis of hypertension: R03.0

## 2014-03-05 MED ORDER — ALBUTEROL SULFATE HFA 108 (90 BASE) MCG/ACT IN AERS: 2.0000 | INHALATION_SPRAY | Freq: Four times a day (QID) | RESPIRATORY_TRACT | Status: DC | PRN

## 2014-03-05 NOTE — Assessment & Plan Note (Signed)
See HPI. Ms Galyean has recovered from her purported COPD exacerbation on 11/7 and completed her 5 day course of steroids. She is back to her baseline status regarding obstructive airway disease. She had minimal wheezing on exam and was sating 96% on RA with some coughing during our interview. The exact diagnosis of her disease is unclear. She says she had PFTs obtained years ago at [facility] but we could not locate them on Care Everywhere. Her history of smoking and adult onset of disease suggests COPD as does some bilateral apical mild emphyzematous changes of the parenchyma noted on a CT in 2009. She may have an asthmatic component as well. It is important do distinguish this as it will help optimize medications. If she has no asthma, there is no role in her montelukast. If she has no COPD, then there is no role in ipratropium other than in severe asthma exacerbations prior to intubation. We will continue her medications as is but obtain a full PFT with lung volumes and diffusing capacity and then reassess her medications when she returns in one month.  -continue albuterol 2 puff q6hprn, ipratropium QIDprn, montelukast 10 mg qhs, dulera 2 puff bid for now -full PFT as described above -reassess medications following PFT results

## 2014-03-05 NOTE — Assessment & Plan Note (Signed)
Ms Nazaryan received the flu vaccine today

## 2014-03-05 NOTE — Patient Instructions (Signed)
It was a pleasure to see you today. Please take the inhalers and nebulizers as we discussed. You will receive a call to schedule the lung function test. Please return to clinic or seek medical attention if you have any new or worsening trouble breathing, fever, or other worrisome medical condition. We look forward to seeing you again in 1 month.  Lottie Mussel, MD  General Instructions:   Please try to bring all your medicines next time. This will help [place] keep you safe from mistakes.   Progress Toward Treatment Goals:  No flowsheet data found.  Self Care Goals & Plans:  No flowsheet data found.  No flowsheet data found.   Care Management & Community Referrals:  Referral 02/14/2013  Referrals made to community resources none

## 2014-03-05 NOTE — Progress Notes (Signed)
   Subjective:    Patient ID: Maria Nelson, female    DOB: November 15, 1955, 58 y.o.   MRN: [id number]  HPI  Ms Maria Nelson is a 58 year old woman with COPD v asthma, GERD, tenosynovitis who presents for follow-up. She was seen in the ED 11/7 for a COPD exacerbation which improved with albuterol, ipratropium, and solumetrol 125mg  and she was sent home with 5 days of prednisone 50 mg daily.   Since she left the ED, she says she feels much better. Her shortness of breath is now back to her baseline which is poor. She is unsure how far she walks before getting SOB but does get SOB in her house as well as if she is overexcited or overexerts herself lifting things. She has a cough productive of yellow sputum that is at her baseline. She sleeps with 3 pillows which she has for years. She uses albuterol prn everyday when she leaves the house and every night. She says she was told she has both COPD and asthma but this was in the past decade. She thinks she has PFTs at [facility] several years ago which are not available on Care Everywhere. She denies chest pain, fevers, chills, night sweats, abdominal pain.  Review of Systems  Constitutional: Negative for fever, chills, diaphoresis and fatigue.  Respiratory: Positive for cough, chest tightness, shortness of breath and wheezing.   Cardiovascular: Negative for chest pain and palpitations.  Gastrointestinal: Negative for nausea, vomiting, abdominal pain, diarrhea and constipation.  Neurological: Negative for dizziness, weakness, light-headedness, numbness and headaches.       Objective:   Physical Exam  Constitutional: She is oriented to person, place, and time. She appears well-developed and well-nourished. No distress.  HENT:  Head: Normocephalic and atraumatic.  Mouth/Throat: Oropharynx is clear and moist.  Eyes: EOM are normal. Pupils are equal, round, and reactive to light.  Cardiovascular: Normal rate, regular rhythm, normal heart sounds and intact  distal pulses.  Exam reveals no gallop and no friction rub.   No murmur heard. Pulmonary/Chest: No respiratory distress.  Faint expiratory wheezes b/l  Abdominal: Soft. Bowel sounds are normal. She exhibits no distension. There is no tenderness.  Musculoskeletal: She exhibits no edema.  Neurological: She is alert and oriented to person, place, and time.  Skin: She is not diaphoretic.  Vitals reviewed.         Assessment & Plan:

## 2014-03-05 NOTE — Assessment & Plan Note (Signed)
BP previously wnl but today 140/85. No antihypertensives -reassess when return to clinic in one month

## 2014-03-06 NOTE — Progress Notes (Signed)
I saw and evaluated the patient. I personally confirmed the key portions of Dr. Rothman's history and exam and reviewed pertinent patient test results. The assessment, diagnosis, and plan were formulated together and I agree with the documentation in the resident's note. 

## 2014-03-06 NOTE — Addendum Note (Signed)
Addended by: Oval Linsey D on: 03/06/2014 05:47 PM   Modules accepted: Level of Service

## 2014-03-11 ENCOUNTER — Ambulatory Visit (HOSPITAL_COMMUNITY)
Admission: RE | Admit: 2014-03-11 | Discharge: 2014-03-11 | Disposition: A | Discharge status: Home / Self Care | Payer: Medicaid Other | Source: Ambulatory Visit | Attending: Internal Medicine | Admitting: Internal Medicine

## 2014-03-11 DIAGNOSIS — J449 Chronic obstructive pulmonary disease, unspecified: Secondary | ICD-10-CM | POA: Diagnosis present

## 2014-03-11 DIAGNOSIS — J4489 Other specified chronic obstructive pulmonary disease: Secondary | ICD-10-CM

## 2014-03-11 LAB — PULMONARY FUNCTION TEST
DL/VA % pred: 79 %
DL/VA: 3.39 ml/min/mmHg/L
DLCO COR: 7.57 ml/min/mmHg
DLCO UNC % PRED: 40 %
DLCO cor % pred: 40 %
DLCO unc: 7.57 ml/min/mmHg
FEF 25-75 PRE: 0.37 L/s
FEF 25-75 Post: 0.48 L/sec
FEF2575-%Change-Post: 30 %
FEF2575-%Pred-Post: 25 %
FEF2575-%Pred-Pre: 19 %
FEV1-%Change-Post: 9 %
FEV1-%PRED-PRE: 46 %
FEV1-%Pred-Post: 50 %
FEV1-PRE: 0.82 L
FEV1-Post: 0.9 L
FEV1FVC-%Change-Post: -6 %
FEV1FVC-%PRED-PRE: 68 %
FEV6-%CHANGE-POST: 11 %
FEV6-%PRED-POST: 75 %
FEV6-%Pred-Pre: 67 %
FEV6-PRE: 1.47 L
FEV6-Post: 1.64 L
FEV6FVC-%Change-Post: -4 %
FEV6FVC-%Pred-Post: 97 %
FEV6FVC-%Pred-Pre: 102 %
FVC-%Change-Post: 16 %
FVC-%PRED-POST: 77 %
FVC-%Pred-Pre: 66 %
FVC-POST: 1.75 L
FVC-Pre: 1.5 L
POST FEV6/FVC RATIO: 94 %
Post FEV1/FVC ratio: 51 %
Pre FEV1/FVC ratio: 55 %
Pre FEV6/FVC Ratio: 98 %
RV % pred: 125 %
RV: 2.21 L
TLC % PRED: 87 %
TLC: 3.88 L

## 2014-03-11 MED ORDER — ALBUTEROL SULFATE (2.5 MG/3ML) 0.083% IN NEBU
2.5000 mg | INHALATION_SOLUTION | Freq: Once | RESPIRATORY_TRACT | Status: AC
  Administered 2014-03-11: 2.5 mg via RESPIRATORY_TRACT

## 2014-03-19 ENCOUNTER — Telehealth: Payer: Self-pay | Admitting: Internal Medicine

## 2014-03-19 NOTE — Telephone Encounter (Signed)
I called [name] today at approximately 1:45 pm to discuss the results of her PFT. 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 I advised her on the following medications changes which she said she wrote down. She is to stop montelukast and will not take her ipratropium QIDPRN but will now be QID (scheduled). She had no questions.  Maria Mussel, MD 03/19/14 1:59 pm

## 2014-04-22 ENCOUNTER — Other Ambulatory Visit: Payer: Self-pay | Admitting: *Deleted

## 2014-04-22 MED ORDER — IPRATROPIUM BROMIDE 0.02 % IN SOLN: RESPIRATORY_TRACT | Status: DC

## 2014-05-27 ENCOUNTER — Other Ambulatory Visit: Payer: Self-pay | Admitting: Internal Medicine

## 2014-06-19 ENCOUNTER — Ambulatory Visit (INDEPENDENT_AMBULATORY_CARE_PROVIDER_SITE_OTHER): Payer: Medicaid Other | Admitting: Internal Medicine

## 2014-06-19 VITALS — BP 107/80 | HR 66 | Temp 98.5°F | Resp 20 | Ht 61.0 in | Wt 214.9 lb

## 2014-06-19 DIAGNOSIS — M79605 Pain in left leg: Secondary | ICD-10-CM

## 2014-06-19 DIAGNOSIS — J439 Emphysema, unspecified: Secondary | ICD-10-CM

## 2014-06-19 DIAGNOSIS — J4489 Other specified chronic obstructive pulmonary disease: Secondary | ICD-10-CM | POA: Insufficient documentation

## 2014-06-19 DIAGNOSIS — J449 Chronic obstructive pulmonary disease, unspecified: Secondary | ICD-10-CM | POA: Insufficient documentation

## 2014-06-19 MED ORDER — IPRATROPIUM BROMIDE 0.02 % IN SOLN: 0.2500 mg | Freq: Four times a day (QID) | RESPIRATORY_TRACT | Status: DC

## 2014-06-19 NOTE — Assessment & Plan Note (Signed)
>>  ASSESSMENT AND PLAN FOR COPD (CHRONIC OBSTRUCTIVE PULMONARY DISEASE) ([place]) WRITTEN ON 06/19/2014  3:12 PM BY Ethelene Hal, ADAM L, MD  Maria Nelson was seen last 02/2014 with unclear diagnosis of COPD v asthma. See previous [facility] note. She had PFT suggestive of COPD and not asthma as there was not adequate bronchodilator response. She was told to stop taking her singulair and take her ipratropium qid, not qidprn. She was also to continue dulera 2 puff bid and albuterol prn. She since saw [facility] on 05/28/14 and was treated for presumed asthma exacerbation. Their note is confusing as they call this asthma but also note she did not have bronchodilator improvement. She says she is now back to her baseline respiratory status. She is sating 100% on room air. -continue dulera 200-5 2 puff bid, albuterol 2 puff q6hprn or 1 neb q1hprn, ipratropium neb qid

## 2014-06-19 NOTE — Assessment & Plan Note (Signed)
Cause of L leg pain unclear. No signs of trauma and she does not remember any. No focal neurologic deficits on exam. She had l spine MRI in 05/2013 that noted moderate disc bulging at L3-4 but she has negative leg raises on exam and no back pain. DVT unlikely as she has no Homan's sign, no calf swelling, posterior tenderness and no recent immobility. She has no history of diabetes in terms of neuropathy. She has good pulses in terms of PVD claudication. Will treat symptomatically with clear return precautions. -tylenol 650 mg po q6hprn -RTC if worsening pain, any numbness, weakness, paresthesias etc

## 2014-06-19 NOTE — Patient Instructions (Addendum)
It was a pleasure to see you today. Please take tylenol 650 mg po every six hours as needed for your leg pain. Please return to clinic or seek medical attention if you have any new or worsening leg pain, numbness, weakness, or other worrisome medical condition. We look forward to seeing you again in 6 months unless you have issues sooner or your leg pain persists.  Lottie Mussel, MD  General Instructions:   Please try to bring all your medicines next time. This will help [place] keep you safe from mistakes.   Progress Toward Treatment Goals:  No flowsheet data found.  Self Care Goals & Plans:  No flowsheet data found.  No flowsheet data found.   Care Management & Community Referrals:  Referral 02/14/2013  Referrals made to community resources none

## 2014-06-19 NOTE — Progress Notes (Signed)
   Subjective:    Patient ID: Maria Nelson, female    DOB: 07-Sep-1955, 59 y.o.   MRN: [id number]  HPI  Maria Nelson is a 59 year old woman with COPD, GERd, tenosynovitis who presents for routine checkup.  After her last visit she got PFTs which suggested she did not have asthma but did have COPD and her montelukast was stopped and ipratropium made qid not qidprn. She then went to [facility] on 05/28/14 for SOB and cough and told she had an asthma exacerbation. She was given prednisone x 5 d and Qvar (beclomethasone) when she had a flare. She was also told to continue montelukast.  She says her breathing is at baseline. She has an occasional cough that is sometimes yellow and sometimes clear. This is her baseline. She says she is not taking the singulair.  While in the waiting room, she acutely developed shooting pain down her L leg from her hip down to her ankle. She says it is worst on the sides of her calf. She has has this off and on for years but feels this is more severe than usual. Standing makes it feel better. No back pain. She does not she was sitting awkwardly on the car ride over to because the chair was uncomfortable.  Physical Exam chaperoned by RN Maria Nelson  Review of Systems  Constitutional: Negative for fever, chills and diaphoresis.  Respiratory: Positive for cough and shortness of breath.   Cardiovascular: Negative for chest pain, palpitations and leg swelling.  Gastrointestinal: Negative for nausea, vomiting, abdominal pain, diarrhea and constipation.  Musculoskeletal: Negative for back pain, joint swelling and gait problem.       L leg pain as above  Neurological: Negative for weakness, light-headedness, numbness and headaches.       Objective:   Physical Exam  Constitutional: She appears well-developed and well-nourished. No distress.  HENT:  Head: Normocephalic and atraumatic.  Mouth/Throat: Oropharynx is clear and moist.  Eyes: EOM are  normal. Pupils are equal, round, and reactive to light.  Cardiovascular: Normal rate, regular rhythm and intact distal pulses.  Exam reveals no gallop and no friction rub.   No murmur heard. Pulmonary/Chest: Effort normal and breath sounds normal. No respiratory distress.  Faint bilateral expiratory wheezes  Abdominal: Soft. Bowel sounds are normal. She exhibits no distension. There is no tenderness.  Musculoskeletal:  Full ROM in b/l LE. No evidence of trauma. Negative leg raises (some pain in hip with maneuver but none down leg), no Homan's sign, 2+ DP pulses b/l, sensation intact and 5/5 strength b/l, no calf swelling warmth tenderness  Skin: She is not diaphoretic.  Vitals reviewed.         Assessment & Plan:

## 2014-06-19 NOTE — Progress Notes (Signed)
Internal Medicine Clinic Attending  I saw and evaluated the patient.  I personally confirmed the key portions of the history and exam documented by Dr. Rothman and I reviewed pertinent patient test results.  The assessment, diagnosis, and plan were formulated together and I agree with the documentation in the resident's note. 

## 2014-06-19 NOTE — Assessment & Plan Note (Signed)
Maria Nelson was seen last 02/2014 with unclear diagnosis of COPD v asthma. See previous [facility] note. She had PFT suggestive of COPD and not asthma as there was not adequate bronchodilator response. She was told to stop taking her singulair and take her ipratropium qid, not qidprn. She was also to continue dulera 2 puff bid and albuterol prn. She since saw [facility] on 05/28/14 and was treated for presumed asthma exacerbation. Their note is confusing as they call this asthma but also note she did not have bronchodilator improvement. She says she is now back to her baseline respiratory status. She is sating 100% on room air. -continue dulera 200-5 2 puff bid, albuterol 2 puff q6hprn or 1 neb q1hprn, ipratropium neb qid

## 2014-06-20 ENCOUNTER — Other Ambulatory Visit: Payer: Self-pay | Admitting: Internal Medicine

## 2014-07-22 ENCOUNTER — Other Ambulatory Visit: Payer: Self-pay | Admitting: Internal Medicine

## 2014-07-22 DIAGNOSIS — Z1231 Encounter for screening mammogram for malignant neoplasm of breast: Secondary | ICD-10-CM

## 2014-07-30 ENCOUNTER — Other Ambulatory Visit: Payer: Self-pay | Admitting: *Deleted

## 2014-07-30 MED ORDER — ALBUTEROL SULFATE (2.5 MG/3ML) 0.083% IN NEBU: INHALATION_SOLUTION | RESPIRATORY_TRACT | Status: DC

## 2014-07-31 ENCOUNTER — Ambulatory Visit (HOSPITAL_COMMUNITY)
Admission: RE | Admit: 2014-07-31 | Discharge: 2014-07-31 | Disposition: A | Discharge status: Home / Self Care | Payer: Medicaid Other | Source: Ambulatory Visit | Attending: Internal Medicine | Admitting: Internal Medicine

## 2014-07-31 DIAGNOSIS — Z1231 Encounter for screening mammogram for malignant neoplasm of breast: Secondary | ICD-10-CM | POA: Insufficient documentation

## 2014-08-10 ENCOUNTER — Other Ambulatory Visit (HOSPITAL_COMMUNITY): Payer: Self-pay

## 2014-08-10 ENCOUNTER — Observation Stay (HOSPITAL_COMMUNITY)
Admission: EM | Admit: 2014-08-10 | Discharge: 2014-08-11 | Disposition: A | Discharge status: Home / Self Care | Payer: Medicaid Other | Attending: Internal Medicine | Admitting: Internal Medicine

## 2014-08-10 ENCOUNTER — Emergency Department (HOSPITAL_COMMUNITY): Payer: Medicaid Other

## 2014-08-10 ENCOUNTER — Encounter (HOSPITAL_COMMUNITY): Payer: Self-pay

## 2014-08-10 DIAGNOSIS — G4730 Sleep apnea, unspecified: Secondary | ICD-10-CM | POA: Diagnosis present

## 2014-08-10 DIAGNOSIS — Z87891 Personal history of nicotine dependence: Secondary | ICD-10-CM | POA: Insufficient documentation

## 2014-08-10 DIAGNOSIS — R0603 Acute respiratory distress: Secondary | ICD-10-CM

## 2014-08-10 DIAGNOSIS — R000 Tachycardia, unspecified: Secondary | ICD-10-CM | POA: Diagnosis not present

## 2014-08-10 DIAGNOSIS — Z9981 Dependence on supplemental oxygen: Secondary | ICD-10-CM | POA: Diagnosis not present

## 2014-08-10 DIAGNOSIS — K219 Gastro-esophageal reflux disease without esophagitis: Secondary | ICD-10-CM

## 2014-08-10 DIAGNOSIS — Z79899 Other long term (current) drug therapy: Secondary | ICD-10-CM | POA: Insufficient documentation

## 2014-08-10 DIAGNOSIS — R079 Chest pain, unspecified: Secondary | ICD-10-CM

## 2014-08-10 DIAGNOSIS — Z7951 Long term (current) use of inhaled steroids: Secondary | ICD-10-CM | POA: Insufficient documentation

## 2014-08-10 DIAGNOSIS — R0602 Shortness of breath: Secondary | ICD-10-CM

## 2014-08-10 DIAGNOSIS — Z8711 Personal history of peptic ulcer disease: Secondary | ICD-10-CM | POA: Diagnosis not present

## 2014-08-10 DIAGNOSIS — G4733 Obstructive sleep apnea (adult) (pediatric): Secondary | ICD-10-CM | POA: Diagnosis not present

## 2014-08-10 DIAGNOSIS — J441 Chronic obstructive pulmonary disease with (acute) exacerbation: Principal | ICD-10-CM | POA: Insufficient documentation

## 2014-08-10 DIAGNOSIS — Z9119 Patient's noncompliance with other medical treatment and regimen: Secondary | ICD-10-CM

## 2014-08-10 LAB — CBC WITH DIFFERENTIAL/PLATELET
Basophils Absolute: 0 10*3/uL (ref 0.0–0.1)
Basophils Absolute: 0 10*3/uL (ref 0.0–0.1)
Basophils Relative: 0 % (ref 0–1)
Basophils Relative: 0 % (ref 0–1)
EOS ABS: 1.4 10*3/uL — AB (ref 0.0–0.7)
EOS PCT: 13 % — AB (ref 0–5)
Eosinophils Absolute: 0 10*3/uL (ref 0.0–0.7)
Eosinophils Relative: 0 % (ref 0–5)
HCT: 42.4 % (ref 36.0–46.0)
HEMATOCRIT: 40.7 % (ref 36.0–46.0)
HEMOGLOBIN: 13.3 g/dL (ref 12.0–15.0)
HEMOGLOBIN: 13.8 g/dL (ref 12.0–15.0)
LYMPHS ABS: 4.1 10*3/uL — AB (ref 0.7–4.0)
Lymphocytes Relative: 40 % (ref 12–46)
Lymphocytes Relative: 6 % — ABNORMAL LOW (ref 12–46)
Lymphs Abs: 0.5 10*3/uL — ABNORMAL LOW (ref 0.7–4.0)
MCH: 25.9 pg — AB (ref 26.0–34.0)
MCH: 26 pg (ref 26.0–34.0)
MCHC: 32.5 g/dL (ref 30.0–36.0)
MCHC: 32.7 g/dL (ref 30.0–36.0)
MCV: 79.3 fL (ref 78.0–100.0)
MCV: 79.8 fL (ref 78.0–100.0)
MONO ABS: 0.6 10*3/uL (ref 0.1–1.0)
MONOS PCT: 6 % (ref 3–12)
Monocytes Absolute: 0 10*3/uL — ABNORMAL LOW (ref 0.1–1.0)
Monocytes Relative: 0 % — ABNORMAL LOW (ref 3–12)
NEUTROS PCT: 41 % — AB (ref 43–77)
NEUTROS PCT: 94 % — AB (ref 43–77)
Neutro Abs: 4.3 10*3/uL (ref 1.7–7.7)
Neutro Abs: 8.1 10*3/uL — ABNORMAL HIGH (ref 1.7–7.7)
PLATELETS: 223 10*3/uL (ref 150–400)
Platelets: 234 10*3/uL (ref 150–400)
RBC: 5.13 MIL/uL — ABNORMAL HIGH (ref 3.87–5.11)
RBC: 5.31 MIL/uL — AB (ref 3.87–5.11)
RDW: 14.4 % (ref 11.5–15.5)
RDW: 14.4 % (ref 11.5–15.5)
WBC: 10.7 10*3/uL — ABNORMAL HIGH (ref 4.0–10.5)
WBC: 8.6 10*3/uL (ref 4.0–10.5)

## 2014-08-10 LAB — I-STAT VENOUS BLOOD GAS, ED
Acid-base deficit: 3 mmol/L — ABNORMAL HIGH (ref 0.0–2.0)
BICARBONATE: 24 meq/L (ref 20.0–24.0)
O2 Saturation: 47 %
PO2 VEN: 29 mmHg — AB (ref 30.0–45.0)
Patient temperature: 37
TCO2: 26 mmol/L (ref 0–100)
pCO2, Ven: 50.9 mmHg — ABNORMAL HIGH (ref 45.0–50.0)
pH, Ven: 7.281 (ref 7.250–7.300)

## 2014-08-10 LAB — BASIC METABOLIC PANEL
Anion gap: 10 (ref 5–15)
BUN: 12 mg/dL (ref 6–23)
CALCIUM: 9.1 mg/dL (ref 8.4–10.5)
CO2: 27 mmol/L (ref 19–32)
CREATININE: 1.07 mg/dL (ref 0.50–1.10)
Chloride: 105 mmol/L (ref 96–112)
GFR calc Af Amer: 65 mL/min — ABNORMAL LOW (ref >90)
GFR calc non Af Amer: 56 mL/min — ABNORMAL LOW (ref >90)
Glucose, Bld: 110 mg/dL — ABNORMAL HIGH (ref 70–99)
Potassium: 3.8 mmol/L (ref 3.5–5.1)
Sodium: 142 mmol/L (ref 135–145)

## 2014-08-10 LAB — I-STAT TROPONIN, ED: Troponin i, poc: 0 ng/mL (ref 0.00–0.08)

## 2014-08-10 LAB — GLUCOSE, CAPILLARY: GLUCOSE-CAPILLARY: 137 mg/dL — AB (ref 70–99)

## 2014-08-10 MED ORDER — ALBUTEROL (5 MG/ML) CONTINUOUS INHALATION SOLN
10.0000 mg/h | INHALATION_SOLUTION | RESPIRATORY_TRACT | Status: DC
  Administered 2014-08-10: 10 mg/h via RESPIRATORY_TRACT
  Filled 2014-08-10: qty 20

## 2014-08-10 MED ORDER — SODIUM CHLORIDE 0.9 % IJ SOLN
3.0000 mL | Freq: Two times a day (BID) | INTRAMUSCULAR | Status: DC
  Administered 2014-08-10 – 2014-08-11 (×3): 3 mL via INTRAVENOUS

## 2014-08-10 MED ORDER — LORATADINE 10 MG PO TABS
10.0000 mg | ORAL_TABLET | Freq: Every day | ORAL | Status: DC
  Administered 2014-08-10 – 2014-08-11 (×2): 10 mg via ORAL
  Filled 2014-08-10 (×2): qty 1

## 2014-08-10 MED ORDER — PANTOPRAZOLE SODIUM 40 MG PO TBEC
40.0000 mg | DELAYED_RELEASE_TABLET | Freq: Every day | ORAL | Status: DC
  Administered 2014-08-10 – 2014-08-11 (×2): 40 mg via ORAL
  Filled 2014-08-10 (×2): qty 1

## 2014-08-10 MED ORDER — IPRATROPIUM BROMIDE 0.02 % IN SOLN
0.5000 mg | Freq: Once | RESPIRATORY_TRACT | Status: DC
  Filled 2014-08-10: qty 2.5

## 2014-08-10 MED ORDER — MAGNESIUM SULFATE 2 GM/50ML IV SOLN
2.0000 g | Freq: Once | INTRAVENOUS | Status: AC
  Administered 2014-08-10: 2 g via INTRAVENOUS
  Filled 2014-08-10: qty 50

## 2014-08-10 MED ORDER — IPRATROPIUM-ALBUTEROL 0.5-2.5 (3) MG/3ML IN SOLN
3.0000 mL | Freq: Four times a day (QID) | RESPIRATORY_TRACT | Status: DC
  Administered 2014-08-10 – 2014-08-11 (×5): 3 mL via RESPIRATORY_TRACT
  Filled 2014-08-10 (×6): qty 3

## 2014-08-10 MED ORDER — BUDESONIDE 0.25 MG/2ML IN SUSP
0.2500 mg | Freq: Two times a day (BID) | RESPIRATORY_TRACT | Status: DC
Start: 2014-08-10 — End: 2014-08-11
  Administered 2014-08-10 – 2014-08-11 (×2): 0.25 mg via RESPIRATORY_TRACT
  Filled 2014-08-10 (×5): qty 2

## 2014-08-10 MED ORDER — ALBUTEROL SULFATE (2.5 MG/3ML) 0.083% IN NEBU: 2.5000 mg | INHALATION_SOLUTION | RESPIRATORY_TRACT | Status: DC | PRN

## 2014-08-10 MED ORDER — MOMETASONE FURO-FORMOTEROL FUM 200-5 MCG/ACT IN AERO
2.0000 | INHALATION_SPRAY | Freq: Two times a day (BID) | RESPIRATORY_TRACT | Status: DC
Start: 2014-08-10 — End: 2014-08-10
  Administered 2014-08-10: 2 via RESPIRATORY_TRACT
  Filled 2014-08-10: qty 8.8

## 2014-08-10 MED ORDER — SODIUM CHLORIDE 0.9 % IV BOLUS (SEPSIS)
1000.0000 mL | Freq: Once | INTRAVENOUS | Status: AC
  Administered 2014-08-10: 1000 mL via INTRAVENOUS

## 2014-08-10 MED ORDER — IPRATROPIUM BROMIDE 0.02 % IN SOLN
1.0000 mg | Freq: Once | RESPIRATORY_TRACT | Status: AC
Start: 2014-08-10 — End: 2014-08-10
  Administered 2014-08-10: 1 mg via RESPIRATORY_TRACT
  Filled 2014-08-10: qty 5

## 2014-08-10 MED ORDER — HEPARIN SODIUM (PORCINE) 5000 UNIT/ML IJ SOLN
5000.0000 [IU] | Freq: Three times a day (TID) | INTRAMUSCULAR | Status: DC
  Administered 2014-08-10 (×3): 5000 [IU] via SUBCUTANEOUS
  Filled 2014-08-10 (×4): qty 1

## 2014-08-10 MED ORDER — PREDNISONE 20 MG PO TABS
40.0000 mg | ORAL_TABLET | Freq: Every day | ORAL | Status: DC
  Administered 2014-08-10 – 2014-08-11 (×2): 40 mg via ORAL
  Filled 2014-08-10 (×3): qty 2

## 2014-08-10 MED ORDER — GUAIFENESIN ER 600 MG PO TB12
600.0000 mg | ORAL_TABLET | Freq: Two times a day (BID) | ORAL | Status: DC | PRN
  Filled 2014-08-10 (×2): qty 1

## 2014-08-10 NOTE — ED Notes (Signed)
Family at bedside. 

## 2014-08-10 NOTE — H&P (Signed)
Date: 08/10/2014               Patient Name:  Maria Nelson MRN: [id number]  DOB: 02-08-1956 Age / Sex: 59 y.o., female   PCP: [name], MD              Medical Service: Internal Medicine Teaching Service              Attending Physician: Dr. Oval Linsey, MD    First Contact: Dr. Ethelene Hal Pager: [phone]  Second Contact: Dr. Gordy Levan Pager: [phone]            After Hours (After 5p/  First Contact Pager: [phone]  weekends / holidays): Second Contact Pager: [phone]   Chief Complaint:  Shortness of breath.  History of Present Illness: Maria Nelson is a 59 year old woman with history of COPD, obstructive sleep apnea, GERD, history of tobacco and ethanol use, and stress urinary incontinence presenting with shortness of breath. She reports developing increasing shortness of breath over the last 2 days associated with a cough. She reports having a chronic cough that is productive of clear sputum, and she denies increase in sputum production or purulence.  She's been using her home inhaler and nebulizer without relief. Today, she was cooking and a pot of water boiled over, and she thinks the steam and smoke made her breathing worse.  She also reports sharp central chest pain and pain at the bottom of her ribs bilaterally that is triggered by coughing. Her shortness of breath is worse with minimal activity.  She denies any sick contacts, fever, chills, nausea, or vomiting. She has a history of obstructive sleep apnea, but she has not been using her CPAP because she does not like wearing the mask. Instead, she sleeps on 4-5 pillows at night.  She denies any leg swelling, leg pain, or hemoptysis.  Due to her increasing shortness of breath, she called EMS. Upon arrival, they noted wheezing in all lung fields and administered Solu-Medrol 125 mg along with DuoNeb's.  In the ER, she required supplemental oxygen. She was given continuous albuterol nebulizers, ipratropium nebulizers,  magnesium sulfate IV, and 1 L of normal saline, but she continued to be hypoxic on room air. However, she reported significant improvement in her breathing at the time of our interview.  Review of Systems: Review of Systems  Constitutional: Negative for fever, chills, weight loss and malaise/fatigue.  HENT: Negative for congestion and sore throat.   Respiratory: Positive for cough, sputum production (Chronic, clear.), shortness of breath and wheezing. Negative for hemoptysis.   Cardiovascular: Positive for chest pain and orthopnea. Negative for palpitations and leg swelling.  Gastrointestinal: Positive for diarrhea (2 days ago, resolved.). Negative for nausea, vomiting, abdominal pain and constipation.  Genitourinary: Negative for dysuria, urgency and frequency.  Musculoskeletal: Positive for myalgias. Negative for back pain and joint pain.  Skin: Negative for rash.  Neurological: Negative for dizziness, sensory change, speech change, focal weakness, weakness and headaches.    Meds:  (Not in a hospital admission) Current Facility-Administered Medications  Medication Dose Route Frequency Provider Last Rate Last Dose  . albuterol (PROVENTIL,VENTOLIN) solution continuous neb  10 mg/hr Nebulization Continuous Everlene Balls, MD   Stopped at 08/10/14 0158  . magnesium sulfate IVPB 2 g 50 mL  2 g Intravenous Once Everlene Balls, MD 50 mL/hr at 08/10/14 0404 2 g at 08/10/14 0404   Current Outpatient Prescriptions  Medication Sig Dispense Refill  . albuterol (PROVENTIL HFA;VENTOLIN HFA) 108 (90 BASE) MCG/ACT  inhaler Inhale 2 puffs into the lungs every 6 (six) hours as needed for wheezing. 3.7 g 3  . albuterol (PROVENTIL) (2.5 MG/3ML) 0.083% nebulizer solution Use 1 vial in nebulizer every 1 hours as needed for wheezing 75 mL 3  . desloratadine (CLARINEX) 5 MG tablet Take 5 mg by mouth daily.    [name] ipratropium (ATROVENT) 0.02 % nebulizer solution INHALE ONE VIAL BY NEBULIZER EVERY 6 HOURS AS NEEDED 2.5  mL 3  . mometasone-formoterol (DULERA) 200-5 MCG/ACT AERO Inhale 2 puffs into the lungs 2 (two) times daily.    . pantoprazole (PROTONIX) 40 MG tablet Take 1 tablet (40 mg total) by mouth daily. 30 tablet 11  . albuterol (PROVENTIL) (5 MG/ML) 0.5% nebulizer solution Take 0.5 mLs (2.5 mg total) by nebulization every 6 (six) hours. (Patient not taking: Reported on 02/23/2014) 20 mL 12  . Cetirizine HCl 10 MG CAPS Take 1 capsule (10 mg total) by mouth once. (Patient not taking: Reported on 08/10/2014) 30 capsule 0  . Guaifenesin 1200 MG TB12 Take 1 tablet (1,200 mg total) by mouth 2 (two) times daily. (Patient not taking: Reported on 08/10/2014) 20 each 0  . hydrOXYzine (VISTARIL) 25 MG capsule Take 4 capsules (100 mg total) by mouth once as needed for itching (facial swelling). (Patient not taking: Reported on 08/10/2014) 30 capsule 4  . ipratropium (ATROVENT) 0.02 % nebulizer solution Take 1.25 mLs (0.25 mg total) by nebulization every 6 (six) hours. (Patient not taking: Reported on 08/10/2014) 75 mL 0    Allergies: Allergies as of 08/09/2014 - Review Complete 06/19/2014  Allergen Reaction Noted  . Bee venom Anaphylaxis 06/30/2012  . Aspirin     Past Medical History  Diagnosis Date  . COPD (chronic obstructive pulmonary disease)     secondary to tobacco use // No PFTs on file  . Gastrointestinal hemorrhage     secondary to PUD on March 2008  . Peptic ulcer disease     +H. pylori (antigen)- Dr. Olevia Perches- treated  . ETOH abuse     Pt stopped in 3/08  . Tobacco abuse     quit in 2010  . OSA on CPAP     noncompliant with CPAP (2/2 it being depressing)  . Urinary incontinence   . Carpal tunnel syndrome 06/23/2011    Patient notes history of bilateral carpal tunnel syndrome.  Now has symptoms of right hand carpal tunnel.   . 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 STRESS INCONTINENCE 01/24/2007   Past Surgical History  Procedure Laterality Date  . Tubal ligation    . Mandible surgery      due to trauma    Family History  Problem Relation Age of Onset  . Diabetes Sister   . Breast cancer Sister 74    died 2/2 breast ca age 66-60  . Diabetes Mother   . Coronary artery disease Mother 8    requiring 5 vessel CABG   History   Social History  . Marital Status: Single    Spouse Name: N/A  . Number of Children: 3  . Years of Education: 12th grade   Occupational History  . Disability     previously worked in [employer] had to stop 2/2 occupational exposures leading to exacerbations of asthma    Social History Main Topics  . Smoking status: Former Smoker -- 0.40 packs/day    Types: Cigarettes    Quit date: 12/04/2010  . Smokeless tobacco: Not on file     Comment: stopped 3 weeks  ago  . Alcohol Use: Yes     Comment: now drinking a beer on weekends //   . Drug Use: No  . Sexual Activity: Not on file   Other Topics Concern  . Not on file   Social History Narrative   Pt is not working now, was previously a Scientist, water quality at [employer].   Pt lives with her mother      Pt has received financial assistance approval for 100% discount at [facility] and has [facility] card.     Physical Exam: Filed Vitals:   08/10/14 0445  BP: 141/72  Pulse: 101  Temp: 98.7  Resp: 17  94% on room air.  Physical Exam  Constitutional: She is oriented to person, place, and time and well-developed, well-nourished, and in no distress. No distress.  Obese.  HENT:  Head: Normocephalic and atraumatic.  Eyes: Conjunctivae and EOM are normal. Pupils are equal, round, and reactive to light. No scleral icterus.  Neck: Normal range of motion. Neck supple.  Cardiovascular: Regular rhythm and normal heart sounds.   Tachycardic.  Pulmonary/Chest: Effort normal. No respiratory distress. She has wheezes (Minimal, end-expiratory bilaterally.). She exhibits tenderness (pain reproducible on exam.).  Abdominal: Soft. Bowel sounds are normal. She exhibits no distension. There is no tenderness.  Musculoskeletal:  Normal range of motion. She exhibits no edema or tenderness.  Neurological: She is alert and oriented to person, place, and time. No cranial nerve deficit. She exhibits normal muscle tone.  Skin: Skin is warm and dry. No rash noted. She is not diaphoretic. No erythema.    Lab results: Basic Metabolic Panel:  Recent Labs  08/10/14 0018  NA 142  K 3.8  CL 105  CO2 27  GLUCOSE 110*  BUN 12  CREATININE 1.07  CALCIUM 9.1   CBC:  Recent Labs  08/10/14 0018  WBC 10.7*  NEUTROABS 4.3  HGB 13.8  HCT 42.4  MCV 79.8  PLT 234   Troponin (Point of Care Test)  Recent Labs  08/10/14 0023  TROPIPOC 0.00   Venous Blood Gas: 7.281/50.9/29.0/24.0  Imaging results:  Dg Chest 2 View  08/10/2014   CLINICAL DATA:  Initial evaluation for acute shortness of breath. History of COPD.  EXAM: CHEST  2 VIEW  COMPARISON:  Prior radiograph from 02/23/2014.  FINDINGS: The cardiac and mediastinal silhouettes are stable in size and contour, and remain within normal limits.  The lungs are hyperinflated with flattening of the hemidiaphragms, compatible with COPD. No airspace consolidation, pleural effusion, or pulmonary edema is identified. There is no pneumothorax.  No acute osseous abnormality identified.  IMPRESSION: 1. No radiographic evidence for active cardiopulmonary disease. 2. COPD.   Electronically Signed   By: Jeannine Boga M.D.   On: 08/10/2014 02:41    Other results: EKG: Normal sinus rhythm, new right axis deviation along with Q waves and T-wave inversion in anterior lateral leads.  Repeat: Normal sinus rhythm, unchanged from prior.  Assessment & Plan by Problem: Principal Problem:   COPD exacerbation Active Problems:   GERD   Sleep apnea   COPD (chronic obstructive pulmonary disease)   #COPD exacerbation The patient was slightly tachycardic on presentation, likely due to nebulizer administration. She is also noted to have leukocytosis following steroid administration.  She reports increased dry cough with no change in production of sputum, and chest x-ray revealed no evidence of active cardiopulmonary disease. The trigger of her COPD exacerbation remains unknown. She is currently satting well on room air  with significant improvement in her breathing. Pulmonary function tests from November 2015 showed FEV1/FVC of 55% with FEV1 46% predicted, no response to bronchodilators. Wells score 1.5, low risk due to tachycardia.  Very low suspicion for PE. -Admit to MedSurg. -DuoNeb's every 6 hours. -Steroids per primary team. -Continue home Clarinex 5 mg daily (ordered as Claritin 10 mg daily). -Continue home Dulera twice a day. -Regular diet. -Supplemental oxygen to greater than 89%.  #Chest pain Most likely musculoskeletal due to coughing. Initial EKG likely abnormal due to lead placement error. Repeat EKG back to baseline. Troponin negative. -Treat cough as above.  #Obstructive sleep apnea Noncompliant with home CPAP. Sleep study in 2012 consistent with severe obstructive sleep apnea. Likely contributing to shortness of breath. Suspect some obesity hypoventilation as well with reduced FVC on pulmonary function tests. -Encouraged CPAP use. -Weight loss.  #GERD -Continue home Protonix 40 mg daily.  #Tobacco and ethanol abuse Not currently smoking and drinks alcohol occasionally, approximately once per week. Last drink Wednesday. -Applauded cessation from tobacco.  #DVT prophylaxis -Heparin.  Dispo: Disposition is deferred at this time, awaiting improvement of current medical problems. Anticipated discharge in approximately 1-2 day(s).   The patient does have a current PCP [name], MD), therefore will be require OPC follow-up after discharge.   The patient does not have transportation limitations that hinder transportation to clinic appointments.   Signed:  Arman Filter, MD, PhD PGY-1 Internal Medicine Teaching Service Pager:  [phone] 08/10/2014, 4:22 AM

## 2014-08-10 NOTE — ED Notes (Signed)
CRITICAL VALUE ALERT  Critical value received:  PO2 29.0  Date of notification:  08/10/2014  Time of notification:  0420  Critical value read back:Yes.    Nurse who received alert:  MG Yony Roulston,RN  MD notified (1st page):  Dr. Claudine Mouton  Time of first page:  0420  MD notified (2nd page):  Time of second page:  Responding MD:  Dr. Claudine Mouton  Time MD responded:  [phone]

## 2014-08-10 NOTE — Progress Notes (Signed)
Utilization Review completed.  

## 2014-08-10 NOTE — ED Notes (Signed)
Pt being transported to xray by xray tech.

## 2014-08-10 NOTE — Progress Notes (Signed)
Pt. Refused cpap. Pt. States she will notify if she changes her mind 

## 2014-08-10 NOTE — Progress Notes (Signed)
Subjective:   VSS.  No overnight events.  Pt is feeling better this AM but still doesn't feel she is back to her baseline in terms of her breathing.  She is afraid sometimes when she gets short of breath because she can get air in but cannot get it out.     Objective:   Vital signs in last 24 hours: Filed Vitals:   08/10/14 0445 08/10/14 0500 08/10/14 0553 08/10/14 0812  BP: 141/72 123/70 126/73   Pulse: 101 105 91   Temp:   98.3 F (36.8 C)   TempSrc:      Resp: 17 17 18    Height:   5' (1.524 m)   Weight:   97.07 kg (214 lb)   SpO2: 94% 91% 95% 95%    Weight: Filed Weights   08/10/14 0006 08/10/14 0553  Weight: 95.255 kg (210 lb) 97.07 kg (214 lb)    I/Os:  Intake/Output Summary (Last 24 hours) at 08/10/14 1041 Last data filed at 08/10/14 0159  Gross per 24 hour  Intake   1050 ml  Output      0 ml  Net   1050 ml    Physical Exam: Constitutional: Vital signs reviewed.  Patient is sitting up in bed in no acute distress and cooperative with exam.   HEENT: [place]/AT; EOMI, conjunctivae normal, no scleral icterus  Neck: Supple.   Cardiovascular: RRR, no MRG Pulmonary/Chest: normal respiratory effort, no accessory muscle use, mild expiratory wheezing, no rales or rhonchi Abdominal: Soft. +BS, NT/ND Neurological: A&O x3, CN II-XII grossly intact; non-focal exam Extremities: 2+DP b/l, no C/C/E  Skin: Warm, dry and intact. No rash  Lab Results:  BMP:  Recent Labs Lab 08/10/14 0018  NA 142  K 3.8  CL 105  CO2 27  GLUCOSE 110*  BUN 12  CREATININE 1.07  CALCIUM 9.1    CBC:  Recent Labs Lab 08/10/14 0018 08/10/14 0500  WBC 10.7* 8.6  NEUTROABS 4.3 8.1*  HGB 13.8 13.3  HCT 42.4 40.7  MCV 79.8 79.3  PLT 234 223   EKG: EKG Interpretation  Date/Time:  Saturday August 10 2014 00:10:06 EDT Ventricular Rate:  93 PR Interval:  167 QRS Duration: 74 QT Interval:  377 QTC Calculation: 469 R Axis:   159 Text Interpretation:  Sinus rhythm Rightward axis  TWI V2-V6 Confirmed by Glynn Octave [id number]) on 08/10/2014 12:13:24 AM   BNP: No results for input(s): PROBNP in the last 168 hours.  D-Dimer: No results for input(s): DDIMER in the last 168 hours.  Urinalysis: No results for input(s): COLORURINE, LABSPEC, PHURINE, GLUCOSEU, HGBUR, BILIRUBINUR, KETONESUR, PROTEINUR, UROBILINOGEN, NITRITE, LEUKOCYTESUR in the last 168 hours.  Invalid input(s): APPERANCEUR  Micro Results: No results found for this or any previous visit (from the past 240 hour(s)).  Blood Culture:    Component Value Date/Time   SDES SPUTUM 09/09/2012 0140   SDES SPUTUM 09/09/2012 0140   SPECREQUEST NONE 09/09/2012 0140   SPECREQUEST NONE 09/09/2012 0140   CULT NORMAL OROPHARYNGEAL FLORA 09/09/2012 0140   REPTSTATUS 09/09/2012 FINAL 09/09/2012 0140   REPTSTATUS 09/12/2012 FINAL 09/09/2012 0140    Studies/Results: Dg Chest 2 View  08/10/2014   CLINICAL DATA:  Initial evaluation for acute shortness of breath. History of COPD.  EXAM: CHEST  2 VIEW  COMPARISON:  Prior radiograph from 02/23/2014.  FINDINGS: The cardiac and mediastinal silhouettes are stable in size and contour, and remain within normal limits.  The lungs are hyperinflated with flattening  of the hemidiaphragms, compatible with COPD. No airspace consolidation, pleural effusion, or pulmonary edema is identified. There is no pneumothorax.  No acute osseous abnormality identified.  IMPRESSION: 1. No radiographic evidence for active cardiopulmonary disease. 2. COPD.   Electronically Signed   By: Jeannine Boga M.D.   On: 08/10/2014 02:41    Medications:  Scheduled Meds: . budesonide (PULMICORT) nebulizer solution  0.25 mg Nebulization BID  . heparin  5,000 Units Subcutaneous 3 times per day  . ipratropium-albuterol  3 mL Nebulization Q6H  . loratadine  10 mg Oral Daily  . pantoprazole  40 mg Oral Daily  . predniSONE  40 mg Oral Q breakfast  . sodium chloride  3 mL Intravenous Q12H    Continuous Infusions:  PRN Meds: albuterol, guaiFENesin  Antibiotics: Antibiotics Given (last 72 hours)    None      Day of Hospitalization:   Consults:    Assessment/Plan:   Principal Problem:   COPD exacerbation Active Problems:   GERD   Sleep apnea  AECOPD Breathing has improved but not back to baseline.  O2 sats 95% on RA.  Likely a component of bronchial hyperresponsiveness.  She would likely benefit from pulmonary rehab which was discussed with pt and she is amenable to this. -cont current meds of duonebs, albuterol PRN, budesonide neb bid (d/c dulera as no need for short and long acting BA)  -add prednisone 40mg  x 5 days today -would benefit from pulmonary rehab  GERD -cont current meds  F/E/N Fluids- None Electrolytes- Replete as needed  Nutrition- Regular diet   VTE PPx  5000 Units Heparin SQ tid   Disposition Anticipated discharge tomorrow.     Jones Bales, MD PGY-2, Internal Medicine Teaching Service 08/10/2014, 10:41 AM

## 2014-08-10 NOTE — Progress Notes (Signed)
Received to room.  Painfree on room air without distress.  Oriented to room and safety policy.  Bed alarm applied and family in room

## 2014-08-10 NOTE — ED Notes (Signed)
MD at bedside. 

## 2014-08-10 NOTE — ED Provider Notes (Signed)
CSN: [id number]     Arrival date & time 08/10/14  0001 History  This chart was scribed for Everlene Balls, MD by Evelene Croon, ED Scribe. This patient was seen in room B17C/B17C and the patient's care was started 11:57 PM.    Chief Complaint  Patient presents with  . Shortness of Breath     The history is provided by the EMS personnel and the patient. No language interpreter was used.     HPI Comments:  Maria Nelson is a 59 y.o. female with a history of COPD brought in by ambulance who presents to the Emergency Department complaining of SOB for 2 days. Pt reports associated dry cough and CP secondary to the cough. She has been using her inhaler at home without relief. Pt notes episode today feels like past COPD exacerbations. Pt arrived on duo neb. EMS reports pt wheezing in all fields and hypertensive en route; pt denies h/o HTN. Pt also denies h/o DVT/PE, recent long travel, and recent surgery.      Past Medical History  Diagnosis Date  . COPD (chronic obstructive pulmonary disease)     secondary to tobacco use // No PFTs on file  . Gastrointestinal hemorrhage     secondary to PUD on March 2008  . Peptic ulcer disease     +H. pylori (antigen)- Dr. Olevia Perches- treated  . ETOH abuse     Pt stopped in 3/08  . Tobacco abuse     quit in 2010  . OSA on CPAP     noncompliant with CPAP (2/2 it being depressing)  . Urinary incontinence   . Carpal tunnel syndrome 06/23/2011    Patient notes history of bilateral carpal tunnel syndrome.  Now has symptoms of right hand carpal tunnel.   . 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 STRESS INCONTINENCE 01/24/2007   Past Surgical History  Procedure Laterality Date  . Tubal ligation    . Mandible surgery      due to trauma   Family History  Problem Relation Age of Onset  . Diabetes Sister   . Breast cancer Sister 58    died 2/2 breast ca age 75-60  . Diabetes Mother   . Coronary artery disease Mother 62    requiring 5 vessel CABG   History   Substance Use Topics  . Smoking status: Former Smoker -- 0.40 packs/day    Types: Cigarettes    Quit date: 12/04/2010  . Smokeless tobacco: Not on file     Comment: stopped 3 weeks ago  . Alcohol Use: Yes     Comment: now drinking a beer on weekends //    OB History    No data available     Review of Systems  Constitutional: Negative for fever and chills.  Respiratory: Positive for cough and shortness of breath.   Cardiovascular: Positive for chest pain.  Gastrointestinal: Negative for vomiting.  All other systems reviewed and are negative.     Allergies  Bee venom and Aspirin  Home Medications   Prior to Admission medications   Medication Sig Start Date End Date Taking? Authorizing Provider  albuterol (PROVENTIL HFA;VENTOLIN HFA) 108 (90 BASE) MCG/ACT inhaler Inhale 2 puffs into the lungs every 6 (six) hours as needed for wheezing. 03/05/14   [name], MD  albuterol (PROVENTIL) (2.5 MG/3ML) 0.083% nebulizer solution Use 1 vial in nebulizer every 1 hours as needed for wheezing 07/30/14   [name], MD  albuterol (PROVENTIL) (5 MG/ML) 0.5% nebulizer solution  Take 0.5 mLs (2.5 mg total) by nebulization every 6 (six) hours. Patient not taking: Reported on 02/23/2014 09/11/12 07/23/13  Otho Bellows, MD  Cetirizine HCl 10 MG CAPS Take 1 capsule (10 mg total) by mouth once. 08/03/13   Malena Catholic, MD  desloratadine (CLARINEX) 5 MG tablet Take 5 mg by mouth daily.    Historical Provider, MD  Guaifenesin 1200 MG TB12 Take 1 tablet (1,200 mg total) by mouth 2 (two) times daily. 02/23/14   Dalia Heading, PA-C  hydrOXYzine (VISTARIL) 25 MG capsule Take 4 capsules (100 mg total) by mouth once as needed for itching (facial swelling). 05/10/13   Hester Mates, MD  ipratropium (ATROVENT) 0.02 % nebulizer solution Take 1.25 mLs (0.25 mg total) by nebulization every 6 (six) hours. 06/19/14   [name], MD  ipratropium (ATROVENT) 0.02 % nebulizer solution INHALE ONE  VIAL BY NEBULIZER EVERY 6 HOURS AS NEEDED 06/20/14   [name], MD  mometasone-formoterol [facility]) 200-5 MCG/ACT AERO Inhale 2 puffs into the lungs 2 (two) times daily.    Historical Provider, MD  pantoprazole (PROTONIX) 40 MG tablet Take 1 tablet (40 mg total) by mouth daily. 06/06/12   Hester Mates, MD   BP 150/100 mmHg  Pulse 100  Temp(Src) 98.7 F (37.1 C) (Oral)  Resp 18  Ht 5' (1.524 m)  Wt 210 lb (95.255 kg)  BMI 41.01 kg/m2  SpO2 100% Physical Exam  Constitutional: She is oriented to person, place, and time. She appears well-developed and well-nourished. She appears distressed.  HENT:  Head: Normocephalic and atraumatic.  Nose: Nose normal.  Mouth/Throat: Oropharynx is clear and moist. No oropharyngeal exudate.  Eyes: Conjunctivae and EOM are normal. Pupils are equal, round, and reactive to light. No scleral icterus.  Neck: Normal range of motion. Neck supple. No JVD present. No tracheal deviation present. No thyromegaly present.  Cardiovascular: Normal heart sounds.  Tachycardia present.  Exam reveals no gallop and no friction rub.   No murmur heard. Pulmonary/Chest: She is in respiratory distress. She has wheezes (Throughout).  Tachypnea  Increased use of accessory muscles   Abdominal: Soft. Bowel sounds are normal. She exhibits no distension and no mass. There is no tenderness. There is no rebound and no guarding.  Musculoskeletal: Normal range of motion. She exhibits no edema or tenderness.  Lymphadenopathy:    She has no cervical adenopathy.  Neurological: She is alert and oriented to person, place, and time. No cranial nerve deficit. She exhibits normal muscle tone.  Skin: Skin is warm and dry. No rash noted. No erythema. No pallor.  Nursing note and vitals reviewed.   ED Course  Procedures   DIAGNOSTIC STUDIES:  Oxygen Saturation is 100% on 10L/min NRB, normal by my interpretation.    COORDINATION OF CARE:  12:04 AM Discussed treatment plan with pt at  bedside and pt agreed to plan.  Labs Review Labs Reviewed  CBC WITH DIFFERENTIAL/PLATELET - Abnormal; Notable for the following:    WBC 10.7 (*)    RBC 5.31 (*)    Neutrophils Relative % 41 (*)    Lymphs Abs 4.1 (*)    Eosinophils Relative 13 (*)    Eosinophils Absolute 1.4 (*)    All other components within normal limits  BASIC METABOLIC PANEL - Abnormal; Notable for the following:    Glucose, Bld 110 (*)    GFR calc non Af Amer 56 (*)    GFR calc Af Amer 65 (*)  All other components within normal limits  Randolm Idol, ED    Imaging Review Dg Chest 2 View  08/10/2014   CLINICAL DATA:  Initial evaluation for acute shortness of breath. History of COPD.  EXAM: CHEST  2 VIEW  COMPARISON:  Prior radiograph from 02/23/2014.  FINDINGS: The cardiac and mediastinal silhouettes are stable in size and contour, and remain within normal limits.  The lungs are hyperinflated with flattening of the hemidiaphragms, compatible with COPD. No airspace consolidation, pleural effusion, or pulmonary edema is identified. There is no pneumothorax.  No acute osseous abnormality identified.  IMPRESSION: 1. No radiographic evidence for active cardiopulmonary disease. 2. COPD.   Electronically Signed   By: Jeannine Boga M.D.   On: 08/10/2014 02:41     EKG Interpretation   Date/Time:  Saturday August 10 2014 00:10:06 EDT Ventricular Rate:  93 PR Interval:  167 QRS Duration: 74 QT Interval:  377 QTC Calculation: 469 R Axis:   159 Text Interpretation:  Sinus rhythm Rightward axis TWI V2-V6 Confirmed by  Glynn Octave [phone]) on 08/10/2014 12:13:24 AM      MDM   Final diagnoses:  SOB (shortness of breath)    Patient since emergency department for shortness of breath and respiratory distress. She has a history of COPD and states this feels consistent with that.  Patient was immediately put on oxygen and continuous albuterol treatment. Patient was also given magnesium and IV fluids.  Will obtain chest x-ray to evaluate for pneumonia.  Chest x-ray is negative for pneumonia. Upon repeat evaluation the patient she continues to wheeze in all lung fields. O2 saturation drops down to 87% on room air. She was placed on nasal cannula, will order 2 more grams of magnesium. Patient will require admission for COPD exacerbation with hypoxia requiring nasal cannula.  CRITICAL CARE Performed by: Everlene Balls   Total critical care time: 96min  Critical care time was exclusive of separately billable procedures and treating other patients.  Critical care was necessary to treat or prevent imminent or life-threatening deterioration.  Critical care was time spent personally by me on the following activities: development of treatment plan with patient and/or surrogate as well as nursing, discussions with consultants, evaluation of patient's response to treatment, examination of patient, obtaining history from patient or surrogate, ordering and performing treatments and interventions, ordering and review of laboratory studies, ordering and review of radiographic studies, pulse oximetry and re-evaluation of patient's condition.   I personally performed the services described in this documentation, which was scribed in my presence. The recorded information has been reviewed and is accurate.   Everlene Balls, MD 08/10/14 [phone]

## 2014-08-10 NOTE — ED Notes (Signed)
Pt here for resp distress, onset yesterday, pt with non productive cough x 3 days, hx of copd and no relief with inhaler. Pt sts Sob became worse while cooking. Ems arrived pt was on NRB, given duo neb and solumedrol 125

## 2014-08-11 DIAGNOSIS — J441 Chronic obstructive pulmonary disease with (acute) exacerbation: Secondary | ICD-10-CM | POA: Diagnosis not present

## 2014-08-11 MED ORDER — PREDNISONE 20 MG PO TABS: 40.0000 mg | ORAL_TABLET | Freq: Every day | ORAL | Status: DC

## 2014-08-11 NOTE — Progress Notes (Signed)
Discharge instructions and medications discussed with patient.  Home O2 tank with n/c with patient.  All questions answered.

## 2014-08-11 NOTE — Progress Notes (Signed)
SATURATION QUALIFICATIONS: (This note is used to comply with regulatory documentation for home oxygen)  Patient Saturations on Room Air at Rest = 94%  Patient Saturations on Room Air while Ambulating = 83%  Patient Saturations on 2Liters of oxygen while Ambulating = 91%  Please briefly explain why patient needs home oxygen: 
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
## 2014-08-11 NOTE — Progress Notes (Signed)
Subjective:   Maria Nelson was refusing her heparin shots last night. She says her SOB has continued to improve and is nearly to baseline. She still has minimal cough productive of yellow sputum but it is improved. She says her wheezing is improved. She is agreeable to discharge today.   Objective:   Vital signs in last 24 hours: Filed Vitals:   08/10/14 1959 08/10/14 2006 08/10/14 2104 08/11/14 0504  BP: 119/73   111/63  Pulse: 88   71  Temp: 99.2 F (37.3 C)   98.6 F (37 C)  TempSrc:      Resp: 18   19  Height:      Weight:   216 lb (97.977 kg)   SpO2: 93% 93%  93%    Weight: Filed Weights   08/10/14 0006 08/10/14 0553 08/10/14 2104  Weight: 210 lb (95.255 kg) 214 lb (97.07 kg) 216 lb (97.977 kg)    I/Os:  Intake/Output Summary (Last 24 hours) at 08/11/14 0820 Last data filed at 08/11/14 0740  Gross per 24 hour  Intake    483 ml  Output    700 ml  Net   -217 ml    Physical Exam: Constitutional: Vital signs reviewed.  Patient laying in bed in no acute distress and cooperative with exam.   HEENT: [place]/AT; EOMI, conjunctivae normal, no scleral icterus  Neck: Supple.   Cardiovascular: RRR, no MRG Pulmonary/Chest: normal respiratory effort, no accessory muscle use, minimal expiratory wheezing bilaterally, no rales or rhonchi Abdominal: Soft. +BS, NT/ND Neurological: A&O x3, CN II-XII grossly intact; non-focal exam Extremities: 2+DP b/l, no C/C/E  Skin: Warm, dry and intact. No rash  Lab Results:  BMP:  Recent Labs Lab 08/10/14 0018  NA 142  K 3.8  CL 105  CO2 27  GLUCOSE 110*  BUN 12  CREATININE 1.07  CALCIUM 9.1    CBC:  Recent Labs Lab 08/10/14 0018 08/10/14 0500  WBC 10.7* 8.6  NEUTROABS 4.3 8.1*  HGB 13.8 13.3  HCT 42.4 40.7  MCV 79.8 79.3  PLT 234 223   EKG: EKG Interpretation  Date/Time:  Saturday August 10 2014 00:10:06 EDT Ventricular Rate:  93 PR Interval:  167 QRS Duration: 74 QT Interval:  377 QTC Calculation: 469 R  Axis:   159 Text Interpretation:  Sinus rhythm Rightward axis TWI V2-V6 Confirmed by Glynn Octave [phone]) on 08/10/2014 12:13:24 AM  Micro Results: No results found for this or any previous visit (from the past 240 hour(s)).  Blood Culture:    Component Value Date/Time   SDES SPUTUM 09/09/2012 0140   SDES SPUTUM 09/09/2012 0140   SPECREQUEST NONE 09/09/2012 0140   SPECREQUEST NONE 09/09/2012 0140   CULT NORMAL OROPHARYNGEAL FLORA 09/09/2012 0140   REPTSTATUS 09/09/2012 FINAL 09/09/2012 0140   REPTSTATUS 09/12/2012 FINAL 09/09/2012 0140    Studies/Results: Dg Chest 2 View  08/10/2014   CLINICAL DATA:  Initial evaluation for acute shortness of breath. History of COPD.  EXAM: CHEST  2 VIEW  COMPARISON:  Prior radiograph from 02/23/2014.  FINDINGS: The cardiac and mediastinal silhouettes are stable in size and contour, and remain within normal limits.  The lungs are hyperinflated with flattening of the hemidiaphragms, compatible with COPD. No airspace consolidation, pleural effusion, or pulmonary edema is identified. There is no pneumothorax.  No acute osseous abnormality identified.  IMPRESSION: 1. No radiographic evidence for active cardiopulmonary disease. 2. COPD.   Electronically Signed   By: Pincus Badder.D.  On: 08/10/2014 02:41    Medications:  Scheduled Meds: . budesonide (PULMICORT) nebulizer solution  0.25 mg Nebulization BID  . heparin  5,000 Units Subcutaneous 3 times per day  . ipratropium-albuterol  3 mL Nebulization Q6H  . loratadine  10 mg Oral Daily  . pantoprazole  40 mg Oral Daily  . predniSONE  40 mg Oral Q breakfast  . sodium chloride  3 mL Intravenous Q12H   Continuous Infusions:  PRN Meds: albuterol, guaiFENesin  Antibiotics: Antibiotics Given (last 72 hours)    None      Assessment/Plan:   Principal Problem:   COPD exacerbation Active Problems:   GERD   Sleep apnea  AECOPD Breathing has improved and is nearly back to baseline.   O2 sats 91-95% on RA.  She would likely benefit from pulmonary rehab which was discussed with pt and she is amenable to this. We discussed the importance of medication compliance to prevent progression of disease.  -cont current meds of duonebs, albuterol PRN, budesonide neb bid  -cont prednisone 40mg  x 5 days through 4/27 -would benefit from pulmonary rehab referral from [facility] clinic  GERD -cont current meds  F/E/N Fluids- None Electrolytes- Replete as needed  Nutrition- Regular diet   VTE PPx  5000 Units Heparin SQ tid   Disposition Anticipated discharge today.     Maria Aline, MD PGY-2, Internal Medicine Teaching Service 08/11/2014, 8:20 AM

## 2014-08-11 NOTE — Progress Notes (Signed)
UR completed 

## 2014-08-11 NOTE — Care Management Note (Signed)
    Page 1 of 1   08/11/2014     5:32:35 PM CARE MANAGEMENT NOTE 08/11/2014  Patient:  Maria Nelson, Maria Nelson   Account Number:  [id number]  Date Initiated:  08/11/2014  Documentation initiated by:  [facility]  Subjective/Objective Assessment:   adm: resp     Action/Plan:   discharge planning   Anticipated DC Date:  08/11/2014   Anticipated DC Plan:  [place]  CM consult      Choice offered to / List presented to:     DME arranged  [facility]      DME agency  [facility].        Status of service:  Completed, signed off Medicare Important Message given?   (If response is "NO", the following Medicare IM given date fields will be blank) Date Medicare IM given:   Medicare IM given by:   Date Additional Medicare IM given:   Additional Medicare IM given by:    Discharge Disposition:  HOME/SELF CARE  Per UR Regulation:    If discussed at Long Length of Stay Meetings, dates discussed:    Comments:  08/11/14 11:21 CM received call from RN requesting home oxygen; CM requested orders to be correctly placed and again requested flow rate.Marland KitchenMarland KitchenCM called [facility] DME rep, james to please deliver O2 to room so pt could be discharged.  No other CM needs were communicated.  Mariane Masters, BSN, Cm [phone].

## 2014-08-11 NOTE — Progress Notes (Signed)
Patient ambulated >75 feet O2 sat 83-92% room air.  Patient tolerated well no complaint of SOB.

## 2014-08-11 NOTE — Discharge Instructions (Signed)
It was a pleasure to care for you at [facility]. You are to take prednisone 40 mg once a day on Monday, Tuesday, and Wednesday. We are giving you home oxygen that you should use when you are up and active. I told the clinic to call you to schedule a follow-up appointment but if you do not hear from them by the end of the day Monday, please call them. Please return to the Emergency Department or seek medical attention if you have any new or worsening fever, trouble breathing, or other worrisome medical condition.   Lottie Mussel, MD

## 2014-08-11 NOTE — Progress Notes (Signed)
Reeducated patient on heparin injection.  Patient was educated yesterday prior to heparin administration.  Patient had verbalized to daughter and night RN that she was getting the heparin injection for her blood sugar.  Patient does not have a history of diabetes nor are we checking blood sugars.  Encouraged patient to ask questions if something is not clear.

## 2014-08-12 ENCOUNTER — Encounter: Payer: Self-pay | Admitting: *Deleted

## 2014-08-16 ENCOUNTER — Ambulatory Visit (INDEPENDENT_AMBULATORY_CARE_PROVIDER_SITE_OTHER): Payer: Medicaid Other | Admitting: Internal Medicine

## 2014-08-16 ENCOUNTER — Encounter: Payer: Self-pay | Admitting: Internal Medicine

## 2014-08-16 VITALS — BP 124/73 | HR 63 | Temp 98.5°F | Wt 217.3 lb

## 2014-08-16 DIAGNOSIS — J439 Emphysema, unspecified: Secondary | ICD-10-CM

## 2014-08-16 DIAGNOSIS — J449 Chronic obstructive pulmonary disease, unspecified: Secondary | ICD-10-CM | POA: Diagnosis not present

## 2014-08-16 DIAGNOSIS — J309 Allergic rhinitis, unspecified: Secondary | ICD-10-CM | POA: Insufficient documentation

## 2014-08-16 DIAGNOSIS — R030 Elevated blood-pressure reading, without diagnosis of hypertension: Secondary | ICD-10-CM | POA: Diagnosis not present

## 2014-08-16 DIAGNOSIS — J45909 Unspecified asthma, uncomplicated: Secondary | ICD-10-CM | POA: Diagnosis not present

## 2014-08-16 MED ORDER — CETIRIZINE HCL 10 MG PO TABS: 10.0000 mg | ORAL_TABLET | Freq: Every day | ORAL | Status: DC

## 2014-08-16 NOTE — Assessment & Plan Note (Signed)
Refilled Zyrtec 10 mg daily. Took Clarinex off her med list as she says Zyrtec works better for her.

## 2014-08-16 NOTE — Progress Notes (Signed)
Internal Medicine Clinic Attending  Case discussed with Dr. Rivet at the time of the visit.  We reviewed the resident's history and exam and pertinent patient test results.  I agree with the assessment, diagnosis, and plan of care documented in the resident's note.  

## 2014-08-16 NOTE — Assessment & Plan Note (Signed)
>>  ASSESSMENT AND PLAN FOR COPD (CHRONIC OBSTRUCTIVE PULMONARY DISEASE) ([place]) WRITTEN ON 08/16/2014 11:41 AM BY RIVET, Sindy [place], MD  Patient doing well since discharge. She has not needed home oxygen. She completed her prednisone course. No wheezing on exam. Her O2 sat was 89-91% on ambulation.  - Refer to pulmonary rehab> form filled out and faxed - Continue Dulera BID, Atrovent/Albuterol nebs PRN, and albuterol inhaler PRN - f/u with PCP in 3 months

## 2014-08-16 NOTE — Patient Instructions (Signed)
It was a pleasure seeing you, Maria Nelson.  - Keep your albuterol inhaler handy when walking  - You will be called to schedule your pulmonary rehab  - Continue using Dulera daily and Atrovent and Albuterol nebulizers  General Instructions:   Thank you for bringing your medicines today. This helps [place] keep you safe from mistakes.   Progress Toward Treatment Goals:  No flowsheet data found.  Self Care Goals & Plans:  No flowsheet data found.  No flowsheet data found.   Care Management & Community Referrals:  Referral 02/14/2013  Referrals made to community resources none

## 2014-08-16 NOTE — Assessment & Plan Note (Signed)
Patient doing well since discharge. She has not needed home oxygen. She completed her prednisone course. No wheezing on exam. Her O2 sat was 89-91% on ambulation.  - Refer to pulmonary rehab> form filled out and faxed - Continue Dulera BID, Atrovent/Albuterol nebs PRN, and albuterol inhaler PRN - f/u with PCP in 3 months

## 2014-08-16 NOTE — Progress Notes (Signed)
   Subjective:    Patient ID: Maria Nelson, female    DOB: 1956/04/14, 59 y.o.   MRN: [id number]  HPI Maria Nelson is a 59yo woman with PMHx of COPD, GERD, OSA, obesity, and hx of tobacco abuse who presents today for a hospital follow up. Patient was hospitalized from 4/23-4/24 for an acute COPD exacerbation. She was noted to desaturate on ambulation to as low as 83% on room air and was discharged home on home oxygen. She was also prescribed a 5 day course of prednisone 40 mg daily.  Today, patient reports feeling well since discharge. She denies dyspnea, cough, and wheezing. She completed the 5 days of prednisone. She reports she has not been using the oxygen at home because she does not feel she needs it. She reports using her Dulera inhaler twice daily, Albuterol nebs/inhaler PRN, and Atrovent nebs PRN.    Review of Systems General: Denies fever, chills, night sweats, changes in weight, changes in appetite HEENT: Denies headaches, ear pain, changes in vision, rhinorrhea, sore throat CV: Denies CP, palpitations, SOB, orthopnea Pulm: Denies SOB, cough, wheezing GI: Denies abdominal pain, nausea, vomiting, diarrhea, constipation, melena, hematochezia GU: Denies dysuria, hematuria, frequency Msk: Denies muscle cramps, joint pains Neuro: Denies weakness, numbness, tingling Skin: Denies rashes, bruising    Objective:   Physical Exam General: sitting up in chair, NAD HEENT: [place]/AT, EOMI, sclera anicteric, mucus membranes moist CV: RRR, no m/g/r Pulm: CTA bilaterally, breaths non-labored on room air. No wheezing noted. O2 sat at rest on RA is 97%. O2 sat with ambulation on RA is 89-91%.  Abd: BS+, soft, obese, non-tender Ext: warm, no edema, moves all Neuro: alert and oriented x 3, no focal deficits    Assessment & Plan:  Please refer to A&P documentation.

## 2014-09-03 ENCOUNTER — Emergency Department (HOSPITAL_COMMUNITY): Payer: Medicaid Other

## 2014-09-03 ENCOUNTER — Encounter (HOSPITAL_COMMUNITY): Payer: Self-pay | Admitting: Emergency Medicine

## 2014-09-03 ENCOUNTER — Other Ambulatory Visit (HOSPITAL_COMMUNITY): Payer: Self-pay

## 2014-09-03 ENCOUNTER — Observation Stay (HOSPITAL_COMMUNITY)
Admission: EM | Admit: 2014-09-03 | Discharge: 2014-09-04 | Disposition: A | Discharge status: Home / Self Care | Payer: Medicaid Other | Attending: Oncology | Admitting: Oncology

## 2014-09-03 ENCOUNTER — Telehealth (HOSPITAL_COMMUNITY): Payer: Self-pay

## 2014-09-03 DIAGNOSIS — K219 Gastro-esophageal reflux disease without esophagitis: Secondary | ICD-10-CM

## 2014-09-03 DIAGNOSIS — G4733 Obstructive sleep apnea (adult) (pediatric): Secondary | ICD-10-CM

## 2014-09-03 DIAGNOSIS — Z9119 Patient's noncompliance with other medical treatment and regimen: Secondary | ICD-10-CM

## 2014-09-03 DIAGNOSIS — J209 Acute bronchitis, unspecified: Secondary | ICD-10-CM

## 2014-09-03 DIAGNOSIS — Z87891 Personal history of nicotine dependence: Secondary | ICD-10-CM | POA: Diagnosis not present

## 2014-09-03 DIAGNOSIS — J9621 Acute and chronic respiratory failure with hypoxia: Secondary | ICD-10-CM | POA: Diagnosis present

## 2014-09-03 DIAGNOSIS — N39498 Other specified urinary incontinence: Secondary | ICD-10-CM | POA: Diagnosis not present

## 2014-09-03 DIAGNOSIS — J441 Chronic obstructive pulmonary disease with (acute) exacerbation: Principal | ICD-10-CM | POA: Insufficient documentation

## 2014-09-03 DIAGNOSIS — Z8711 Personal history of peptic ulcer disease: Secondary | ICD-10-CM | POA: Insufficient documentation

## 2014-09-03 DIAGNOSIS — Z7951 Long term (current) use of inhaled steroids: Secondary | ICD-10-CM | POA: Insufficient documentation

## 2014-09-03 DIAGNOSIS — K922 Gastrointestinal hemorrhage, unspecified: Secondary | ICD-10-CM | POA: Diagnosis not present

## 2014-09-03 DIAGNOSIS — Z79899 Other long term (current) drug therapy: Secondary | ICD-10-CM | POA: Diagnosis not present

## 2014-09-03 DIAGNOSIS — R0602 Shortness of breath: Secondary | ICD-10-CM | POA: Diagnosis present

## 2014-09-03 DIAGNOSIS — G5600 Carpal tunnel syndrome, unspecified upper limb: Secondary | ICD-10-CM | POA: Insufficient documentation

## 2014-09-03 LAB — CBC WITH DIFFERENTIAL/PLATELET
Basophils Absolute: 0 10*3/uL (ref 0.0–0.1)
Basophils Relative: 0 % (ref 0–1)
EOS PCT: 13 % — AB (ref 0–5)
Eosinophils Absolute: 1 10*3/uL — ABNORMAL HIGH (ref 0.0–0.7)
HCT: 43.5 % (ref 36.0–46.0)
Hemoglobin: 13.9 g/dL (ref 12.0–15.0)
LYMPHS PCT: 38 % (ref 12–46)
Lymphs Abs: 3 10*3/uL (ref 0.7–4.0)
MCH: 25.6 pg — ABNORMAL LOW (ref 26.0–34.0)
MCHC: 32 g/dL (ref 30.0–36.0)
MCV: 80.1 fL (ref 78.0–100.0)
MONOS PCT: 8 % (ref 3–12)
Monocytes Absolute: 0.6 10*3/uL (ref 0.1–1.0)
NEUTROS ABS: 3.3 10*3/uL (ref 1.7–7.7)
Neutrophils Relative %: 41 % — ABNORMAL LOW (ref 43–77)
Platelets: 278 10*3/uL (ref 150–400)
RBC: 5.43 MIL/uL — ABNORMAL HIGH (ref 3.87–5.11)
RDW: 14.5 % (ref 11.5–15.5)
WBC: 7.9 10*3/uL (ref 4.0–10.5)

## 2014-09-03 LAB — I-STAT CHEM 8, ED
BUN: 10 mg/dL (ref 6–20)
CALCIUM ION: 1.18 mmol/L (ref 1.12–1.23)
CREATININE: 1.1 mg/dL — AB (ref 0.44–1.00)
Chloride: 106 mmol/L (ref 101–111)
GLUCOSE: 95 mg/dL (ref 65–99)
HCT: 48 % — ABNORMAL HIGH (ref 36.0–46.0)
HEMOGLOBIN: 16.3 g/dL — AB (ref 12.0–15.0)
POTASSIUM: 4.3 mmol/L (ref 3.5–5.1)
Sodium: 143 mmol/L (ref 135–145)
TCO2: 25 mmol/L (ref 0–100)

## 2014-09-03 LAB — I-STAT TROPONIN, ED: Troponin i, poc: 0 ng/mL (ref 0.00–0.08)

## 2014-09-03 MED ORDER — IPRATROPIUM-ALBUTEROL 0.5-2.5 (3) MG/3ML IN SOLN
3.0000 mL | Freq: Once | RESPIRATORY_TRACT | Status: AC
  Administered 2014-09-03: 3 mL via RESPIRATORY_TRACT
  Filled 2014-09-03: qty 3

## 2014-09-03 MED ORDER — PREDNISONE 20 MG PO TABS
60.0000 mg | ORAL_TABLET | Freq: Once | ORAL | Status: AC
  Administered 2014-09-03: 60 mg via ORAL
  Filled 2014-09-03: qty 3

## 2014-09-03 NOTE — H&P (Signed)
Date: 09/04/2014               Patient Name:  Maria Nelson MRN: [id number]  DOB: [birth year] Age / Sex: 59 y.o., female   PCP: [name], MD         Medical Service: Internal Medicine Teaching Service         Attending Physician: Dr. Annia Belt, MD    First Contact: Dr. Albin Felling Pager: [phone]  Second Contact: Dr. Joni Reining Pager: [phone]       After Hours (After 5p/  First Contact Pager: [phone]  weekends / holidays): Second Contact Pager: [phone]   Chief Complaint: SOB  History of Present Illness: Maria Nelson is a 59 year old woman with COPD, GERD, OSA who presents with SOB. She was in her usual state of health until this weekend when she says she developed a cold. She says her brother had a cold. She had a day of generalized body aches, but mostly just SOB and a dry cough. She says she also has had chest tightness but no chest pain. She says she can walk about 20 feet before she feels SOB. She has baseline six pillow orthopnea which is unchanged but no LE swelling. She also denies any fevers, chills, night sweats, abdominal pain, nausea, diarrhea, constipation.  Of note, she was recently hospitalized from 4/23-4/24/16 for COPD exacerbation and was discharged with home oxygen. At [facility] follow-up on 08/16/14, she felt well and had not been using home oxygen. She sated 89-91% on ambulation with room air. She received referral to pulmonary rehab but was contacted today and told the price which she cannot afford.  In the ED, she sated in the low 80s with ambulation. She received two duoneb treatments and prednisone 60 mg daily. She felt her chest tightness then resolved and her SOB improved after treatments.  Meds: No current facility-administered medications for this encounter.   Current Outpatient Prescriptions  Medication Sig Dispense Refill  . albuterol (PROVENTIL HFA;VENTOLIN HFA) 108 (90 BASE) MCG/ACT inhaler Inhale 2 puffs into the lungs every 6 (six)  hours as needed for wheezing. 3.7 g 3  . albuterol (PROVENTIL) (2.5 MG/3ML) 0.083% nebulizer solution Use 1 vial in nebulizer every 1 hours as needed for wheezing 75 mL 3  . beclomethasone (QVAR) 80 MCG/ACT inhaler Inhale 2 puffs into the lungs 2 (two) times daily.    . cetirizine (ZYRTEC ALLERGY) 10 MG tablet Take 1 tablet (10 mg total) by mouth daily. 30 tablet 5  . hydrOXYzine (VISTARIL) 25 MG capsule Take 4 capsules (100 mg total) by mouth once as needed for itching (facial swelling). 30 capsule 4  . ipratropium (ATROVENT) 0.02 % nebulizer solution INHALE ONE VIAL BY NEBULIZER EVERY 6 HOURS AS NEEDED 2.5 mL 3  . mometasone-formoterol (DULERA) 200-5 MCG/ACT AERO Inhale 2 puffs into the lungs 2 (two) times daily.    . pantoprazole (PROTONIX) 40 MG tablet Take 1 tablet (40 mg total) by mouth daily. 30 tablet 11  . Guaifenesin 1200 MG TB12 Take 1 tablet (1,200 mg total) by mouth 2 (two) times daily. 20 each 0  . predniSONE (DELTASONE) 20 MG tablet Take 2 tablets (40 mg total) by mouth daily with breakfast. Take 4/25, 4/26, 4/27 6 tablet 0    Allergies: Allergies as of 09/03/2014 - Review Complete 09/03/2014  Allergen Reaction Noted  . Bee venom Anaphylaxis 06/30/2012  . Aspirin Nausea Only    Past Medical History  Diagnosis Date  .  COPD (chronic obstructive pulmonary disease)     secondary to tobacco use // No PFTs on file  . Gastrointestinal hemorrhage     secondary to PUD on March 2008  . Peptic ulcer disease     +H. pylori (antigen)- Dr. Olevia Perches- treated  . ETOH abuse     Pt stopped in 3/08  . Tobacco abuse     quit in 2010  . OSA on CPAP     noncompliant with CPAP (2/2 it being depressing)  . Urinary incontinence   . Carpal tunnel syndrome 06/23/2011    Patient notes history of bilateral carpal tunnel syndrome.  Now has symptoms of right hand carpal tunnel.   . 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 STRESS INCONTINENCE 01/24/2007   Past Surgical History  Procedure Laterality Date  . Tubal  ligation    . Mandible surgery      due to trauma   Family History  Problem Relation Age of Onset  . Diabetes Sister   . Breast cancer Sister 10    died 2/2 breast ca age 45-60  . Diabetes Mother   . Coronary artery disease Mother 58    requiring 5 vessel CABG   History   Social History  . Marital Status: Single    Spouse Name: N/A  . Number of Children: 3  . Years of Education: 12th grade   Occupational History  . Disability     previously worked in [employer] had to stop 2/2 occupational exposures leading to exacerbations of asthma    Social History Main Topics  . Smoking status: Former Smoker -- 0.40 packs/day    Types: Cigarettes    Quit date: 12/04/2010  . Smokeless tobacco: Not on file     Comment: stopped 3 weeks ago  . Alcohol Use: 0.0 oz/week    0 Standard drinks or equivalent per week     Comment: now drinking a beer on weekends //   . Drug Use: No  . Sexual Activity: Not on file   Other Topics Concern  . Not on file   Social History Narrative   Pt is not working now, was previously a Scientist, water quality at [employer].   Pt lives with her mother      Pt has received financial assistance approval for 100% discount at [facility] and has [facility] card.     Review of Systems: Review of systems negative except as noted above per HPI  Physical Exam: Blood pressure 123/84, pulse 92, temperature 98.4 F (36.9 C), temperature source Oral, resp. rate 18, height 5' (1.524 m), weight 212 lb 4 oz (96.276 kg), SpO2 95 %.  Gen: No acute distress, well developed, well nourished HEENT: Atraumatic, PERRL, EOMI, sclerae anicteric, moist mucous membranes, no lymphadenopathy, no sinus tenderness Heart: Regular rate and rhythm, normal S1 S2, no murmurs, rubs, or gallops Lungs: Faint expiratory wheezes with prolonged expiratory phase bilaterally, respirations unlabored Abd: Soft, non-tender, non-distended, + bowel sounds, no hepatosplenomegaly Ext: No edema or cyanosis  Lab  results: Basic Metabolic Panel:  Recent Labs  09/03/14 2039  NA 143  K 4.3  CL 106  GLUCOSE 95  BUN 10  CREATININE 1.10*   CBC:  Recent Labs  09/03/14 2009 09/03/14 2039  WBC 7.9  --   NEUTROABS 3.3  --   HGB 13.9 16.3*  HCT 43.5 48.0*  MCV 80.1  --   PLT 278  --    i-stat troponin 0.00  Imaging results:  Dg Chest  2 View  09/03/2014   CLINICAL DATA:  Initial evaluation for acute sudden onset shortness of breath.  EXAM: CHEST  2 VIEW  COMPARISON:  Prior radiograph from 08/10/2014  FINDINGS: The cardiac and mediastinal silhouettes are stable in size and contour, and remain within normal limits.  The lungs are normally inflated. Changes related the COPD again noted. No airspace consolidation, pleural effusion, or pulmonary edema is identified. There is no pneumothorax.  No acute osseous abnormality identified.  IMPRESSION: 1. No active cardiopulmonary disease. 2. COPD.   Electronically Signed   By: Jeannine Boga M.D.   On: 09/03/2014 21:41    Other results: EKG: NSR, no ST or t-wave changes, compared to prior 08/10/14  Assessment & Plan by Problem: Active Problems:   * No active hospital problems. *  #COPD Exacerbation: Maria Ladnier's SOB is likely due to COPD exacerbation. This could be triggered by URI. She had already received 2 duoneb treatments and prednisone 60 mg po once in the ED and felt improved. We turned off her O2 and she was in the low 90s while lying still but went down to 80s with movement. She is afebrile with no leukocytosis. CXR negative for acute process but notes stable changes related to COPD. Cardiac cause unlikely with negative troponin and unchanged EKG. Last echo 07/2012 EF 65-70%. Last PFT 02/2014 with GOLD stage 2 FEV/FVC 55%, FVC 77%, FEV1 50% and she has no LE edema despite baseline orthopnea. Also consider pulmonary HTN given she is non-compliant with OSA CPAP but that would likely not cause wheezes. She was recently referred to pulmonary  rehab as she had expressed interest in this but thinks she cannot afford the $68 monthly fee. At home she is on dulera 1 puff bid, atrovent/albuterol neb prn, and albuterol 2 puff q6hprn. Will hold on antibiotics as she does not have increased sputum production or purulence. -duoneb q4h -pulmicort 0.25 mg neb bid -dulera 2 puff bid -prednisone 40 mg po tomorrow am -mucinex 600 mg po bid -robitussin dm 5 mL po q4hprn -loratidine 10 mg po daily -HIV, UDS -recheck O2 with ambulation to determine oxygen requirement -O2 supplementation as needed to be >92% -daily weight, I&O  #GERD: At home she is on protonix 40 mg daily -cont protonix 40 mg daily  #OSA: She is supposed to use CPAP at home with sleep study 09/2010 showing severe OSA. She says she does not use it -CPAP qhs  #Diet: regular  #DVT PPx: heparin 5000 u [place] tid  #Code: Full  Dispo: Disposition is deferred at this time, awaiting improvement of current medical problems. Anticipated discharge in approximately 1 day(s).   The patient does have a current PCP ([name], MD) and does need an [facility] hospital follow-up appointment after discharge.  The patient does not know have transportation limitations that hinder transportation to clinic appointments.  Signed: Lottie Mussel, MD Internal Medicine, PGY-1 Pager [phone] 09/04/2014, 12:20 AM

## 2014-09-03 NOTE — ED Notes (Signed)
MD at bedside. Pt reports she is feeling better.

## 2014-09-03 NOTE — ED Notes (Signed)
Pt to ED with c/o shortness of breath.  Pt has hx of COPD and asthma.  Pt is on home 02 at 2LPM.  Pt c/o chest being tight and unable to breath.

## 2014-09-03 NOTE — Telephone Encounter (Signed)
Called patient to discuss Pulmonary Rehab.  Patient states that she is not able to afford rehab since she is on Medicaid.  Patient was offered our Pulmonary Rehab Maintenance program which is paid out of pocket $68 per month.  Patient states she can not afford that.  Patient was encouraged to contact us in the future if her situation ever changes.

## 2014-09-03 NOTE — ED Provider Notes (Signed)
CSN: [id number]     Arrival date & time 09/03/14  1959 History   First MD Initiated Contact with Patient 09/03/14 2037     Chief Complaint  Patient presents with  . Shortness of Breath     (Consider location/radiation/quality/duration/timing/severity/associated sxs/prior Treatment) HPI Comments: 59 year old female history of COPD comes in with chief complaint shortness of breath. Patient states she's had a URI for several days. Today with worsening shortness of breath and wheezing unresponsive to home treatments.  Patient is a 59 y.o. female presenting with shortness of breath.  Shortness of Breath Severity:  Moderate Onset quality:  Gradual Timing:  Constant Progression:  Worsening Chronicity:  Recurrent Context comment:  Recent URI. Relieved by:  Nothing Worsened by:  Activity Ineffective treatments:  Inhaler Associated symptoms: cough   Associated symptoms: no abdominal pain, no chest pain, no fever, no headaches and no rash     Past Medical History  Diagnosis Date  . COPD (chronic obstructive pulmonary disease)     secondary to tobacco use // No PFTs on file  . Gastrointestinal hemorrhage     secondary to PUD on March 2008  . Peptic ulcer disease     +H. pylori (antigen)- Dr. Olevia Perches- treated  . ETOH abuse     Pt stopped in 3/08  . Tobacco abuse     quit in 2010  . OSA on CPAP     noncompliant with CPAP (2/2 it being depressing)  . Urinary incontinence   . Carpal tunnel syndrome 06/23/2011    Patient notes history of bilateral carpal tunnel syndrome.  Now has symptoms of right hand carpal tunnel.   . 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 STRESS INCONTINENCE 01/24/2007   Past Surgical History  Procedure Laterality Date  . Tubal ligation    . Mandible surgery      due to trauma   Family History  Problem Relation Age of Onset  . Diabetes Sister   . Breast cancer Sister 79    died 2/2 breast ca age 5-60  . Diabetes Mother   . Coronary artery disease Mother 14    requiring 5  vessel CABG   History  Substance Use Topics  . Smoking status: Former Smoker -- 0.40 packs/day    Types: Cigarettes    Quit date: 12/04/2010  . Smokeless tobacco: Not on file     Comment: stopped 3 weeks ago  . Alcohol Use: 0.0 oz/week    0 Standard drinks or equivalent per week     Comment: now drinking a beer on weekends //    OB History    No data available     Review of Systems  Constitutional: Negative for fever.  HENT: Positive for congestion.   Respiratory: Positive for cough and shortness of breath.   Cardiovascular: Negative for chest pain.  Gastrointestinal: Negative for abdominal pain.  Genitourinary: Negative for dysuria.  Musculoskeletal: Negative for back pain.  Skin: Negative for rash.  Neurological: Negative for headaches.  Psychiatric/Behavioral: Negative for confusion.  All other systems reviewed and are negative.     Allergies  Bee venom and Aspirin  Home Medications   Prior to Admission medications   Medication Sig Start Date End Date Taking? Authorizing Provider  albuterol (PROVENTIL HFA;VENTOLIN HFA) 108 (90 BASE) MCG/ACT inhaler Inhale 2 puffs into the lungs every 6 (six) hours as needed for wheezing. 03/05/14  Yes [name], MD  albuterol (PROVENTIL) (2.5 MG/3ML) 0.083% nebulizer solution Use 1 vial in nebulizer every 1  hours as needed for wheezing 07/30/14  Yes [name], MD  beclomethasone (QVAR) 80 MCG/ACT inhaler Inhale 2 puffs into the lungs 2 (two) times daily.   Yes Historical Provider, MD  cetirizine (ZYRTEC ALLERGY) 10 MG tablet Take 1 tablet (10 mg total) by mouth daily. 08/16/14  Yes Carly Montey Hora, MD  hydrOXYzine (VISTARIL) 25 MG capsule Take 4 capsules (100 mg total) by mouth once as needed for itching (facial swelling). 05/10/13  Yes Hester Mates, MD  ipratropium (ATROVENT) 0.02 % nebulizer solution INHALE ONE VIAL BY NEBULIZER EVERY 6 HOURS AS NEEDED 06/20/14  Yes [name], MD  mometasone-formoterol [facility]) 200-5 MCG/ACT  AERO Inhale 2 puffs into the lungs 2 (two) times daily.   Yes Historical Provider, MD  pantoprazole (PROTONIX) 40 MG tablet Take 1 tablet (40 mg total) by mouth daily. 06/06/12  Yes Hester Mates, MD  Guaifenesin 1200 MG TB12 Take 1 tablet (1,200 mg total) by mouth 2 (two) times daily. 02/23/14   Dalia Heading, PA-C  predniSONE (DELTASONE) 20 MG tablet Take 2 tablets (40 mg total) by mouth daily with breakfast. Take 4/25, 4/26, 4/27 08/11/14   [name], MD   BP 121/82 mmHg  Pulse 75  Temp(Src) 98.4 F (36.9 C) (Oral)  Resp 15  Ht 5' (1.524 m)  Wt 212 lb 4 oz (96.276 kg)  BMI 41.45 kg/m2  SpO2 96% Physical Exam  Constitutional: She is oriented to person, place, and time. She appears well-developed.  HENT:  Head: Normocephalic and atraumatic.  Eyes: Pupils are equal, round, and reactive to light.  Neck: Normal range of motion.  Cardiovascular: Normal rate and intact distal pulses.   Pulmonary/Chest: She has wheezes.  Slightly increased work of breathing. Wheezing throughout all fields.  Abdominal: Soft. She exhibits no distension. There is no tenderness.  Musculoskeletal: She exhibits no tenderness.  Neurological: She is alert and oriented to person, place, and time. No cranial nerve deficit. She exhibits normal muscle tone.  Skin: Skin is warm.  Psychiatric: She has a normal mood and affect.  Vitals reviewed.   ED Course  Procedures (including critical care time) Labs Review Labs Reviewed  CBC WITH DIFFERENTIAL/PLATELET - Abnormal; Notable for the following:    RBC 5.43 (*)    MCH 25.6 (*)    Neutrophils Relative % 41 (*)    Eosinophils Relative 13 (*)    Eosinophils Absolute 1.0 (*)    All other components within normal limits  I-STAT CHEM 8, ED - Abnormal; Notable for the following:    Creatinine, Ser 1.10 (*)    Hemoglobin 16.3 (*)    HCT 48.0 (*)    All other components within normal limits  I-STAT TROPOININ, ED    Imaging Review Dg Chest 2  View  09/03/2014   CLINICAL DATA:  Initial evaluation for acute sudden onset shortness of breath.  EXAM: CHEST  2 VIEW  COMPARISON:  Prior radiograph from 08/10/2014  FINDINGS: The cardiac and mediastinal silhouettes are stable in size and contour, and remain within normal limits.  The lungs are normally inflated. Changes related the COPD again noted. No airspace consolidation, pleural effusion, or pulmonary edema is identified. There is no pneumothorax.  No acute osseous abnormality identified.  IMPRESSION: 1. No active cardiopulmonary disease. 2. COPD.   Electronically Signed   By: Jeannine Boga M.D.   On: 09/03/2014 21:41     EKG Interpretation None      MDM  59 year old female  history of COPD comes in with symptoms consistent with a COPD exacerbation. Initially patient has diminished air movement and wheezing bilaterally. However satting low 90s on room air. Was placed on 2 L nasal cannula maintain sats upper 90s. Was given prednisone 1 breathing treatment with significant subjective improvement. Chest x-ray showed no consolidative process pneumothorax or other serious. Patient's afebrile with normal vitals do not suspect patient with infectious etiology. On reevaluation patient oxygen was cut off and patient had sats dropped to low 80s. Did clarify she had been on home O2 previously however was discontinued by physician several weeks ago. Does not oxygen at home now. Will give another breathing treatment admit to hospitalist for continued treatments and reevaluation for possible home O2   Final diagnoses:  COPD exacerbation        Robynn Pane, MD 09/03/14 2239  Alfonzo Beers, MD 09/03/14 2243

## 2014-09-04 ENCOUNTER — Encounter (HOSPITAL_COMMUNITY): Payer: Self-pay | Admitting: *Deleted

## 2014-09-04 DIAGNOSIS — J9621 Acute and chronic respiratory failure with hypoxia: Secondary | ICD-10-CM | POA: Diagnosis present

## 2014-09-04 LAB — RAPID URINE DRUG SCREEN, HOSP PERFORMED
Amphetamines: NOT DETECTED
BARBITURATES: NOT DETECTED
Benzodiazepines: NOT DETECTED
Cocaine: NOT DETECTED
Opiates: NOT DETECTED
TETRAHYDROCANNABINOL: NOT DETECTED

## 2014-09-04 LAB — BASIC METABOLIC PANEL
ANION GAP: 10 (ref 5–15)
Anion gap: 7 (ref 5–15)
BUN: 9 mg/dL (ref 6–20)
BUN: 13 mg/dL (ref 6–20)
CALCIUM: 9.4 mg/dL (ref 8.9–10.3)
CALCIUM: 9.4 mg/dL (ref 8.9–10.3)
CO2: 25 mmol/L (ref 22–32)
CO2: 25 mmol/L (ref 22–32)
Chloride: 106 mmol/L (ref 101–111)
Chloride: 107 mmol/L (ref 101–111)
Creatinine, Ser: 1.07 mg/dL — ABNORMAL HIGH (ref 0.44–1.00)
Creatinine, Ser: 1.11 mg/dL — ABNORMAL HIGH (ref 0.44–1.00)
GFR calc Af Amer: >60 mL/min (ref >60)
GFR, EST NON AFRICAN AMERICAN: 56 mL/min — AB (ref >60)
GFR, EST NON AFRICAN AMERICAN: 53 mL/min — AB (ref >60)
Glucose, Bld: 146 mg/dL — ABNORMAL HIGH (ref 65–99)
Glucose, Bld: 165 mg/dL — ABNORMAL HIGH (ref 65–99)
Potassium: 4 mmol/L (ref 3.5–5.1)
Potassium: 4.1 mmol/L (ref 3.5–5.1)
SODIUM: 139 mmol/L (ref 135–145)
Sodium: 141 mmol/L (ref 135–145)

## 2014-09-04 LAB — HIV ANTIBODY (ROUTINE TESTING W REFLEX): HIV Screen 4th Generation wRfx: NONREACTIVE

## 2014-09-04 MED ORDER — HEPARIN SODIUM (PORCINE) 5000 UNIT/ML IJ SOLN
5000.0000 [IU] | Freq: Three times a day (TID) | INTRAMUSCULAR | Status: DC
  Administered 2014-09-04: 5000 [IU] via SUBCUTANEOUS
  Filled 2014-09-04 (×3): qty 1

## 2014-09-04 MED ORDER — BUDESONIDE 0.25 MG/2ML IN SUSP
0.2500 mg | Freq: Two times a day (BID) | RESPIRATORY_TRACT | Status: DC
  Administered 2014-09-04: 0.25 mg via RESPIRATORY_TRACT
  Filled 2014-09-04 (×3): qty 2

## 2014-09-04 MED ORDER — LORATADINE 10 MG PO TABS
10.0000 mg | ORAL_TABLET | Freq: Every day | ORAL | Status: DC
  Administered 2014-09-04: 10 mg via ORAL
  Filled 2014-09-04: qty 1

## 2014-09-04 MED ORDER — GUAIFENESIN ER 600 MG PO TB12
600.0000 mg | ORAL_TABLET | Freq: Two times a day (BID) | ORAL | Status: DC
  Administered 2014-09-04 (×2): 600 mg via ORAL
  Filled 2014-09-04 (×3): qty 1

## 2014-09-04 MED ORDER — ACETAMINOPHEN 650 MG RE SUPP: 650.0000 mg | Freq: Four times a day (QID) | RECTAL | Status: DC | PRN

## 2014-09-04 MED ORDER — PREDNISONE 20 MG PO TABS
40.0000 mg | ORAL_TABLET | Freq: Every day | ORAL | Status: DC
  Administered 2014-09-04: 40 mg via ORAL
  Filled 2014-09-04 (×3): qty 2

## 2014-09-04 MED ORDER — PREDNISONE 20 MG PO TABS: 40.0000 mg | ORAL_TABLET | Freq: Every day | ORAL | Status: DC

## 2014-09-04 MED ORDER — IPRATROPIUM-ALBUTEROL 0.5-2.5 (3) MG/3ML IN SOLN
3.0000 mL | RESPIRATORY_TRACT | Status: DC
  Administered 2014-09-04 (×4): 3 mL via RESPIRATORY_TRACT
  Filled 2014-09-04 (×4): qty 3

## 2014-09-04 MED ORDER — GUAIFENESIN-DM 100-10 MG/5ML PO SYRP
5.0000 mL | ORAL_SOLUTION | ORAL | Status: DC | PRN
  Filled 2014-09-04: qty 5

## 2014-09-04 MED ORDER — PANTOPRAZOLE SODIUM 40 MG PO TBEC
40.0000 mg | DELAYED_RELEASE_TABLET | Freq: Every day | ORAL | Status: DC
  Administered 2014-09-04: 40 mg via ORAL
  Filled 2014-09-04: qty 1

## 2014-09-04 MED ORDER — ACETAMINOPHEN 325 MG PO TABS: 650.0000 mg | ORAL_TABLET | Freq: Four times a day (QID) | ORAL | Status: DC | PRN

## 2014-09-04 MED ORDER — GUAIFENESIN-DM 100-10 MG/5ML PO SYRP: 5.0000 mL | ORAL_SOLUTION | ORAL | Status: DC | PRN

## 2014-09-04 MED ORDER — MOMETASONE FURO-FORMOTEROL FUM 200-5 MCG/ACT IN AERO
2.0000 | INHALATION_SPRAY | Freq: Two times a day (BID) | RESPIRATORY_TRACT | Status: DC
  Administered 2014-09-04: 2 via RESPIRATORY_TRACT
  Filled 2014-09-04: qty 8.8

## 2014-09-04 NOTE — Progress Notes (Signed)
Patient refused to accept her home oxygen supplies ,explained to patient the needs to wear one for her health and physical safety but patient still refusing on it.On call M.D. Made aware.

## 2014-09-04 NOTE — Progress Notes (Signed)
Report obtained from ED RN. Kaheem Halleck Joselita, RN 

## 2014-09-04 NOTE — Progress Notes (Signed)
Patient said '" I could not afford it" about refusing  Oxygen supplies.

## 2014-09-04 NOTE — Progress Notes (Signed)
Subjective: Patient reports mild cough this morning, but overall states her breathing is better. She reports she has had a "head cold" for a few days. She is breathing comfortably and saturating 98% on 1 L oxygen.   Objective: Vital signs in last 24 hours: Filed Vitals:   09/04/14 0127 09/04/14 0419 09/04/14 0450 09/04/14 0929  BP:   130/85 123/80  Pulse:  77 112 78  Temp:   98.2 F (36.8 C) 98.2 F (36.8 C)  TempSrc:    Oral  Resp:  18 19 18   Height:      Weight:      SpO2: 98% 98% 95% 98%   Weight change:   Intake/Output Summary (Last 24 hours) at 09/04/14 1120 Last data filed at 09/04/14 0929  Gross per 24 hour  Intake    320 ml  Output    400 ml  Net    -80 ml   Physical Exam General: middle aged woman sitting up in bed, no respiratory distress HEENT: [place]/AT, EOMI, sclera anicteric, moist mucous membranes CV: RRR, no m/g/r Pulm: Mild expiratory wheezes bilaterally  Abd: BS+, soft, non-tender, non-distended Ext: warm, no edema  Neuro: alert and oriented x 3, no focal deficits  Lab Results: Basic Metabolic Panel:  Recent Labs Lab 09/03/14 2311 09/04/14 0545  NA 141 139  K 4.0 4.1  CL 106 107  CO2 25 25  GLUCOSE 146* 165*  BUN 9 13  CREATININE 1.07* 1.11*  CALCIUM 9.4 9.4   CBC:  Recent Labs Lab 09/03/14 2009 09/03/14 2039  WBC 7.9  --   NEUTROABS 3.3  --   HGB 13.9 16.3*  HCT 43.5 48.0*  MCV 80.1  --   PLT 278  --    Studies/Results: Dg Chest 2 View  09/03/2014   CLINICAL DATA:  Initial evaluation for acute sudden onset shortness of breath.  EXAM: CHEST  2 VIEW  COMPARISON:  Prior radiograph from 08/10/2014  FINDINGS: The cardiac and mediastinal silhouettes are stable in size and contour, and remain within normal limits.  The lungs are normally inflated. Changes related the COPD again noted. No airspace consolidation, pleural effusion, or pulmonary edema is identified. There is no pneumothorax.  No acute osseous abnormality identified.   IMPRESSION: 1. No active cardiopulmonary disease. 2. COPD.   Electronically Signed   By: Jeannine Boga M.D.   On: 09/03/2014 21:41   Medications: I have reviewed the patient's current medications.  Assessment/Plan:  COPD Exacerbation: Patient presented with a 4-5 day history of cold symptoms (SOB, dry cough) found to be hypoxic in mid 80s in setting of COPD. Her exacerbation is most likely secondary to an upper respiratory infection given her "head cold" symptoms.  She was recently hospitalized for a COPD exacerbation (4/23-4/24) and required home oxygen, however she did not use it. She reports she did end up using the oxygen yesterday as she was having significant dyspnea. Will check oxygen saturation on ambulation as patient will likely require home oxygen. Patient to go home today with 4 day rx for prednisone for total of 5 days of steroids.  - Check O2 sat with ambulation - Continue Prednisone 40 mg daily (prescribe at discharge for 4 days) - Continue pulmicort nebs BID - Continue Duonebs Q4H - Continue Dulera inhaler BID - Continue claritin 10 mg daily - Continue mucinex BID - Continue robitussin  - Continue oxygen therapy   GERD: Takes Protonix 40 mg daily at home - Continue home Protonix  OSA: Sleep study in June 2012 shows severe OSA. She does not use CPAP at home even though she was recommended to.  - Continue CPAP in hospital - Recommended for patient to use CPAP at home   Diet: Regular  VTE PPx: Heparin SQ Dispo: Discharge today  The patient does have a current PCP [name], MD) and does need an [facility] hospital follow-up appointment after discharge.  The patient does not have transportation limitations that hinder transportation to clinic appointments.  .Services Needed at time of discharge: Y = Yes, Blank = No PT:   OT:   RN:   Equipment:   Other:       Juliet Rude, MD 09/04/2014, 11:20 AM

## 2014-09-04 NOTE — Progress Notes (Signed)
New Admission Note:   Arrival Method: Via stretcher from ED Mental Orientation: Alert and oriented x4 Telemetry: N/A Assessment: Completed Skin: Intact IV: NSL-L Hand Pain: Denies Tubes: N/A Safety Measures: Safety Fall Prevention Plan has been given, discussed and signed Admission: Completed [address] [place] Orientation: Patient has been orientated to the room, unit and staff.  Family: none at this time  Orders have been reviewed and implemented. Will continue to monitor the patient. Call light has been placed within reach and bed alarm has been activated.   [employer], RN-BC Phone number: [phone]

## 2014-09-04 NOTE — Progress Notes (Signed)
SATURATION QUALIFICATIONS: (This note is used to comply with regulatory documentation for home oxygen)  Patient Saturations on Room Air at Rest = 93%  Patient Saturations on Room Air while Ambulating = 85%  Patient Saturations on  2  Liters of oxygen while Ambulating = 94%  Please briefly explain why patient needs home oxygen:Patient needs 2 lpm of oxygen for her safety.

## 2014-09-04 NOTE — Discharge Instructions (Signed)
It was a pleasure taking care of you, Ms. Hrivnak.   - Take Prednisone 40 mg daily for the next 3 days starting tomorrow (09/05/14) - Continue your home COPD medications, including Advair, Albuterol, Qvar, and Dulera  - You have follow up in the [facility] on May 25th, please make this appointment   Take care, Dr. Arcelia Jew

## 2014-09-04 NOTE — Progress Notes (Signed)
Patient refused bed alarm to be turned on.Advised to call for any assistance.Call bell within reach. Haliyah Fryman, Wonda Cheng, Therapist, sports

## 2014-09-04 NOTE — Discharge Summary (Signed)
Name: Maria Nelson MRN: [id number] DOB: 02/24/1956 59 y.o. PCP: [name], MD  Date of Admission: 09/03/2014  8:34 PM Date of Discharge: 09/04/2014 Attending Physician: Annia Belt, MD  Discharge Diagnosis: Principal Problem COPD Exacerbation Active Problems GERD  OSA  Discharge Medications:   Medication List    TAKE these medications        albuterol 108 (90 BASE) MCG/ACT inhaler  Commonly known as:  PROVENTIL HFA;VENTOLIN HFA  Inhale 2 puffs into the lungs every 6 (six) hours as needed for wheezing.     albuterol (2.5 MG/3ML) 0.083% nebulizer solution  Commonly known as:  PROVENTIL  Use 1 vial in nebulizer every 1 hours as needed for wheezing     beclomethasone 80 MCG/ACT inhaler  Commonly known as:  QVAR  Inhale 2 puffs into the lungs 2 (two) times daily.     cetirizine 10 MG tablet  Commonly known as:  ZYRTEC ALLERGY  Take 1 tablet (10 mg total) by mouth daily.     DULERA 200-5 MCG/ACT Aero  Generic drug:  mometasone-formoterol  Inhale 2 puffs into the lungs 2 (two) times daily.     Guaifenesin 1200 MG Tb12  Take 1 tablet (1,200 mg total) by mouth 2 (two) times daily.     guaiFENesin-dextromethorphan 100-10 MG/5ML syrup  Commonly known as:  ROBITUSSIN DM  Take 5 mLs by mouth every 4 (four) hours as needed for cough.     hydrOXYzine 25 MG capsule  Commonly known as:  VISTARIL  Take 4 capsules (100 mg total) by mouth once as needed for itching (facial swelling).     ipratropium 0.02 % nebulizer solution  Commonly known as:  ATROVENT  INHALE ONE VIAL BY NEBULIZER EVERY 6 HOURS AS NEEDED     pantoprazole 40 MG tablet  Commonly known as:  PROTONIX  Take 1 tablet (40 mg total) by mouth daily.     predniSONE 20 MG tablet  Commonly known as:  DELTASONE  Take 2 tablets (40 mg total) by mouth daily with breakfast.        Disposition and follow-up:   Maria Nelson was [facility] from [facility] in Good  condition.  At the hospital follow up visit please address:  1.  COPD- Ambulate pt to see if she requires oxygen supplementation. Did pt complete prednisone? How is her breathing> OSA- Is pt using her CPAP at home?  2.  Labs / imaging needed at time of follow-up: None   3.  Pending labs/ test needing follow-up: None   Follow-up Appointments:     Follow-up Information    Follow up with Clinton Gallant, MD.   Specialty:  Internal Medicine   Why:  Appointment on May 25th at 2:15 PM   Contact information:   [place] [phone]       Procedures Performed:  Dg Chest 2 View  09/03/2014   CLINICAL DATA:  Initial evaluation for acute sudden onset shortness of breath.  EXAM: CHEST  2 VIEW  COMPARISON:  Prior radiograph from 08/10/2014  FINDINGS: The cardiac and mediastinal silhouettes are stable in size and contour, and remain within normal limits.  The lungs are normally inflated. Changes related the COPD again noted. No airspace consolidation, pleural effusion, or pulmonary edema is identified. There is no pneumothorax.  No acute osseous abnormality identified.  IMPRESSION: 1. No active cardiopulmonary disease. 2. COPD.   Electronically Signed   By: Pincus Badder.D.  On: 09/03/2014 21:41   Dg Chest 2 View  08/10/2014   CLINICAL DATA:  Initial evaluation for acute shortness of breath. History of COPD.  EXAM: CHEST  2 VIEW  COMPARISON:  Prior radiograph from 02/23/2014.  FINDINGS: The cardiac and mediastinal silhouettes are stable in size and contour, and remain within normal limits.  The lungs are hyperinflated with flattening of the hemidiaphragms, compatible with COPD. No airspace consolidation, pleural effusion, or pulmonary edema is identified. There is no pneumothorax.  No acute osseous abnormality identified.  IMPRESSION: 1. No radiographic evidence for active cardiopulmonary disease. 2. COPD.   Electronically Signed   By: Jeannine Boga M.D.   On:  08/10/2014 02:41    Admission HPI: Maria Nelson is a 59 year old woman with COPD, GERD, OSA who presents with SOB. She was in her usual state of health until this weekend when she says she developed a cold. She says her brother had a cold. She had a day of generalized body aches, but mostly just SOB and a dry cough. She says she also has had chest tightness but no chest pain. She says she can walk about 20 feet before she feels SOB. She has baseline six pillow orthopnea which is unchanged but no LE swelling. She also denies any fevers, chills, night sweats, abdominal pain, nausea, diarrhea, constipation.  Of note, she was recently hospitalized from 4/23-4/24/16 for COPD exacerbation and was discharged with home oxygen. At [facility] follow-up on 08/16/14, she felt well and had not been using home oxygen. She sated 89-91% on ambulation with room air. She received referral to pulmonary rehab but was contacted today and told the price which she cannot afford.  In the ED, she sated in the low 80s with ambulation. She received two duoneb treatments and prednisone 59 mg daily. She felt her chest tightness then resolved and her SOB improved after treatments.  Hospital Course by problem list:  COPD Exacerbation: Patient presented with a 4-5 day history of cold symptoms (SOB, dry cough) found to be hypoxic in mid 80s in setting of COPD. Her exacerbation was most likely secondary to an upper respiratory infection given her "head cold" symptoms. She improved after receiving prednisone and nebulizer treatments. She was recently hospitalized for a COPD exacerbation (4/23-4/24) and required home oxygen, however she did not use it. She was found to desaturate to 85% on room air while ambulating during her hospitalization. She was ordered home oxygen but the patient refused taking home an oxygen tank, stating she could not afford it. She was discharged home on Prednisone 40 mg daily for another 3 days to complete a 5 day course  and recommended to continue her home COPD medications including albuterol, Qvar, Atrovent, and Dulera.   GERD: Patient was continued on her home Protonix 40 mg daily.    OSA: Sleep study in June 2012 shows severe OSA. She admitted to not using CPAP at home even though she was recommended to. She was placed on CPAP at night while in the hospital and recommended to continue this at home.   Discharge Vitals:   BP 123/80 mmHg  Pulse 78  Temp(Src) 98.2 F (36.8 C) (Oral)  Resp 18  Ht 5' (1.524 m)  Wt 218 lb 4.1 oz (99 kg)  BMI 42.63 kg/m2  SpO2 98% Physical Exam General: middle aged woman sitting up in bed, no respiratory distress HEENT: [place]/AT, EOMI, sclera anicteric, moist mucous membranes CV: RRR, no m/g/r Pulm: Mild expiratory wheezes bilaterally  Abd: BS+, soft, non-tender, non-distended Ext: warm, no edema  Neuro: alert and oriented x 3, no focal deficits  Discharge Labs:  Results for orders placed or performed during the hospital encounter of 09/03/14 (from the past 24 hour(s))  CBC with Differential     Status: Abnormal   Collection Time: 09/03/14  8:09 PM  Result Value Ref Range   WBC 7.9 4.0 - 10.5 K/uL   RBC 5.43 (H) 3.87 - 5.11 MIL/uL   Hemoglobin 13.9 12.0 - 15.0 g/dL   HCT 43.5 36.0 - 46.0 %   MCV 80.1 78.0 - 100.0 fL   MCH 25.6 (L) 26.0 - 34.0 pg   MCHC 32.0 30.0 - 36.0 g/dL   RDW 14.5 11.5 - 15.5 %   Platelets 278 150 - 400 K/uL   Neutrophils Relative % 41 (L) 43 - 77 %   Neutro Abs 3.3 1.7 - 7.7 K/uL   Lymphocytes Relative 38 12 - 46 %   Lymphs Abs 3.0 0.7 - 4.0 K/uL   Monocytes Relative 8 3 - 12 %   Monocytes Absolute 0.6 0.1 - 1.0 K/uL   Eosinophils Relative 13 (H) 0 - 5 %   Eosinophils Absolute 1.0 (H) 0.0 - 0.7 K/uL   Basophils Relative 0 0 - 1 %   Basophils Absolute 0.0 0.0 - 0.1 K/uL  I-Stat Troponin, ED (not at [facility])     Status: None   Collection Time: 09/03/14  8:38 PM  Result Value Ref Range   Troponin i, poc 0.00 0.00 - 0.08 ng/mL   Comment 3           I-Stat Chem 8, ED     Status: Abnormal   Collection Time: 09/03/14  8:39 PM  Result Value Ref Range   Sodium 143 135 - 145 mmol/L   Potassium 4.3 3.5 - 5.1 mmol/L   Chloride 106 101 - 111 mmol/L   BUN 10 6 - 20 mg/dL   Creatinine, Ser 1.10 (H) 0.44 - 1.00 mg/dL   Glucose, Bld 95 65 - 99 mg/dL   Calcium, Ion 1.18 1.12 - 1.23 mmol/L   TCO2 25 0 - 100 mmol/L   Hemoglobin 16.3 (H) 12.0 - 15.0 g/dL   HCT 48.0 (H) 36.0 - 93.7 %  Basic metabolic panel     Status: Abnormal   Collection Time: 09/03/14 11:11 PM  Result Value Ref Range   Sodium 141 135 - 145 mmol/L   Potassium 4.0 3.5 - 5.1 mmol/L   Chloride 106 101 - 111 mmol/L   CO2 25 22 - 32 mmol/L   Glucose, Bld 146 (H) 65 - 99 mg/dL   BUN 9 6 - 20 mg/dL   Creatinine, Ser 1.07 (H) 0.44 - 1.00 mg/dL   Calcium 9.4 8.9 - 10.3 mg/dL   GFR calc non Af Amer 56 (L) >60 mL/min   GFR calc Af Amer >60 >60 mL/min   Anion gap 10 5 - 15  Urine rapid drug screen (hosp performed)     Status: None   Collection Time: 09/04/14  1:41 AM  Result Value Ref Range   Opiates NONE DETECTED NONE DETECTED   Cocaine NONE DETECTED NONE DETECTED   Benzodiazepines NONE DETECTED NONE DETECTED   Amphetamines NONE DETECTED NONE DETECTED   Tetrahydrocannabinol NONE DETECTED NONE DETECTED   Barbiturates NONE DETECTED NONE DETECTED  Basic metabolic panel     Status: Abnormal   Collection Time: 09/04/14  5:45 AM  Result Value Ref Range  Sodium 139 135 - 145 mmol/L   Potassium 4.1 3.5 - 5.1 mmol/L   Chloride 107 101 - 111 mmol/L   CO2 25 22 - 32 mmol/L   Glucose, Bld 165 (H) 65 - 99 mg/dL   BUN 13 6 - 20 mg/dL   Creatinine, Ser 1.11 (H) 0.44 - 1.00 mg/dL   Calcium 9.4 8.9 - 10.3 mg/dL   GFR calc non Af Amer 53 (L) >60 mL/min   GFR calc Af Amer >60 >60 mL/min   Anion gap 7 5 - 15    Signed: Juliet Rude, MD 09/04/2014, 1:35 PM    Services Ordered on Discharge: None Equipment Ordered on Discharge: None

## 2014-09-04 NOTE — Progress Notes (Signed)
Patient was sitting at bedside,room air saturation baseline was 94%,heart rate of 90 beats/min.Ambulated on entire unit hallway,patient was able to maintained 88% to 95% of room air saturation with heart rate of 95 beats/minutes while ambulating,midway on the hallway going back to her room ,there was 3 second dropped of room oxygen saturation to 85% but immediately went up to room air 92% with heart rate of 100 beats/min.Entire ambulation well tolerated by patient.

## 2014-09-11 ENCOUNTER — Encounter: Payer: Self-pay | Admitting: Internal Medicine

## 2014-09-11 ENCOUNTER — Ambulatory Visit: Payer: Medicaid Other | Admitting: Internal Medicine

## 2014-09-13 ENCOUNTER — Telehealth: Payer: Self-pay | Admitting: Licensed Clinical Social Worker

## 2014-09-13 ENCOUNTER — Encounter: Payer: Self-pay | Admitting: Licensed Clinical Social Worker

## 2014-09-13 NOTE — Telephone Encounter (Signed)
Ms. Bury referred to [place] [facility] by nursing stating pt has had to cancel multiple appointments due to lack of transportation.  [facility] stating pt in need of new transportation assessment through DSS.  CSW placed call to Ms. Hendler and encouraged pt to contact her DSS Medicaid caseworker and complete a transportation assessment.  Once the assessment is complete and pt still active with Medicaid, pt will be able to schedule.  CSW informed Ms. Yepez, should something happen with her Medicaid eligibility, pt should apply for the [place] as transportation is available with [facility].  Pt states she will contact her caseworker.

## 2014-09-13 NOTE — Progress Notes (Signed)
Patient ID: Maria Nelson, female   DOB: [birth year], 59 y.o.   MRN: [id number] Maria Nelson was referred to [place] as pt states she had to cancel multiple upcoming appointments because her Medicaid transportation was "cut off".  Pt told nursing she is working with a Ms. Blue at [employer].  CSW sent inquiry to [facility], transportation and [place] that has been assigned to Ms. Harewood.  CSW awaiting responses.

## 2014-09-25 IMAGING — CR DG CHEST 2V
2 series · 2 of 2 positions shown · non-contrast
Comparison: 09/03/2011

CLINICAL DATA: Cough.  Short of breath.

CHEST - 2 VIEW

[w chest pa]
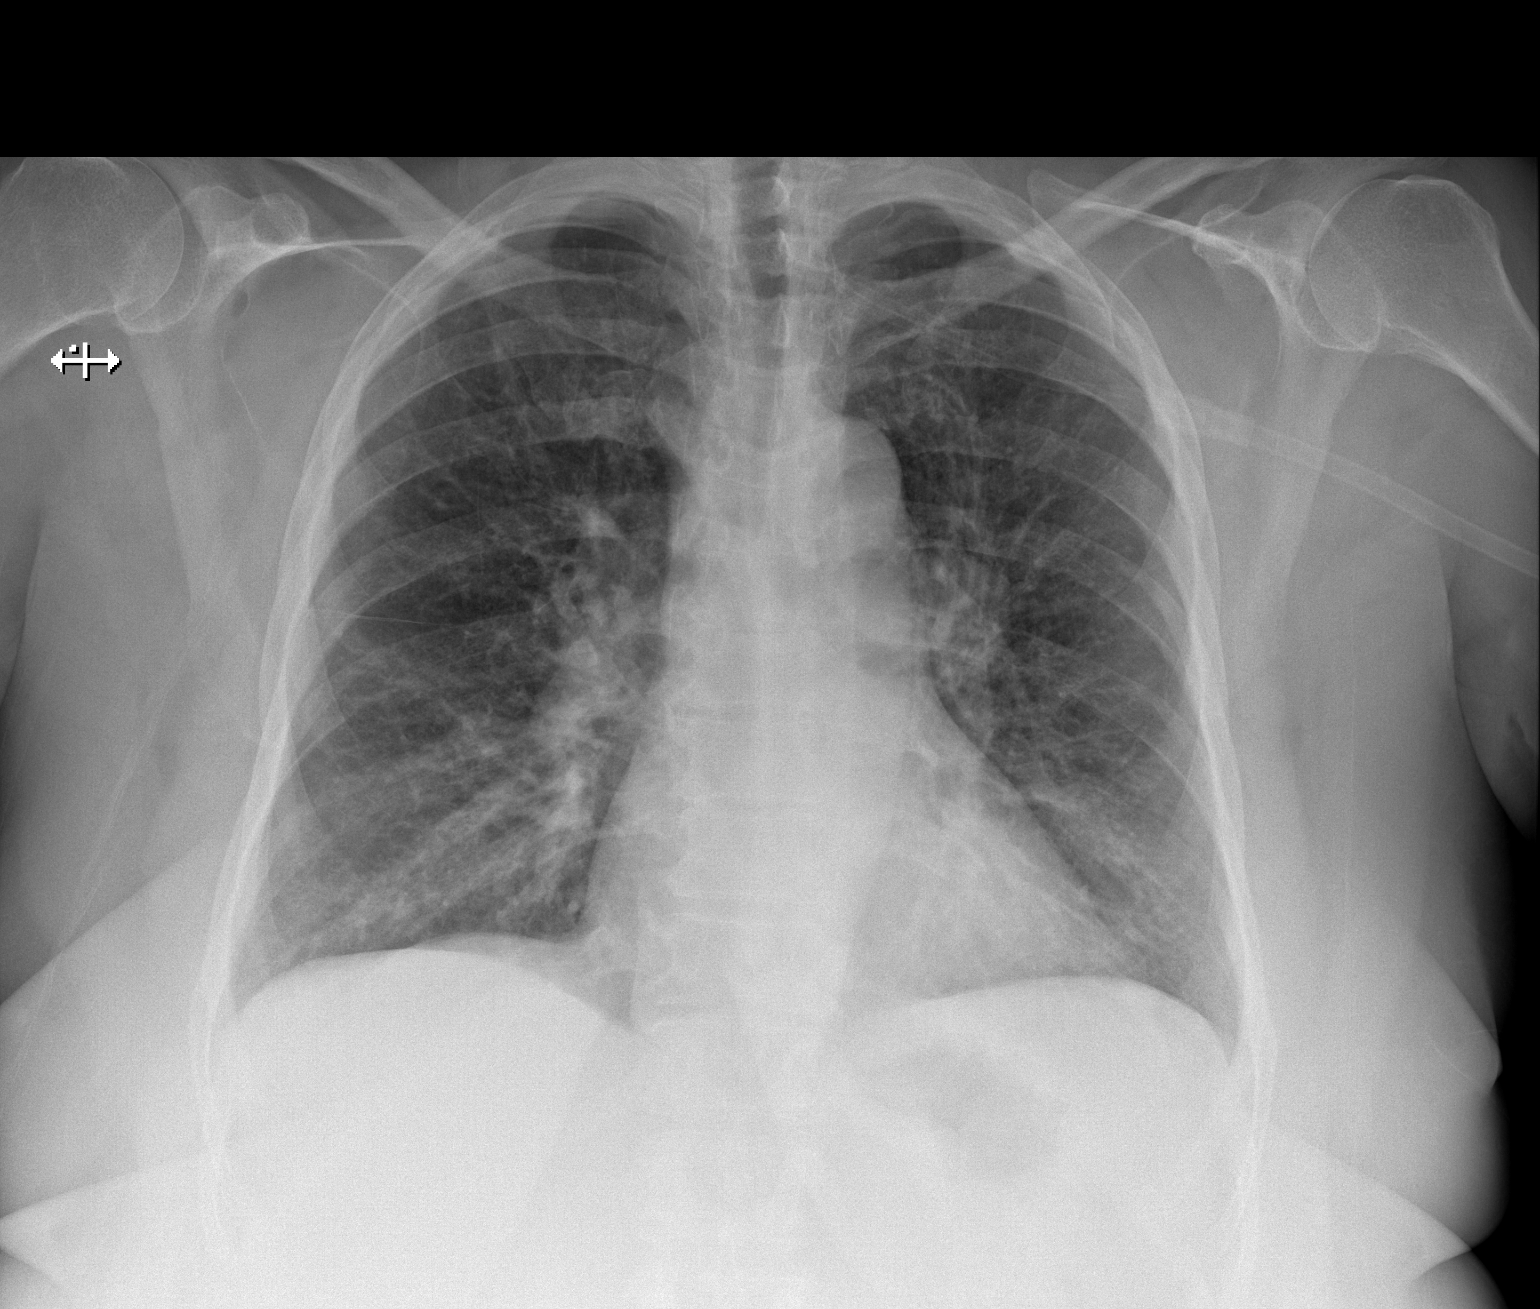

[w chest lat]
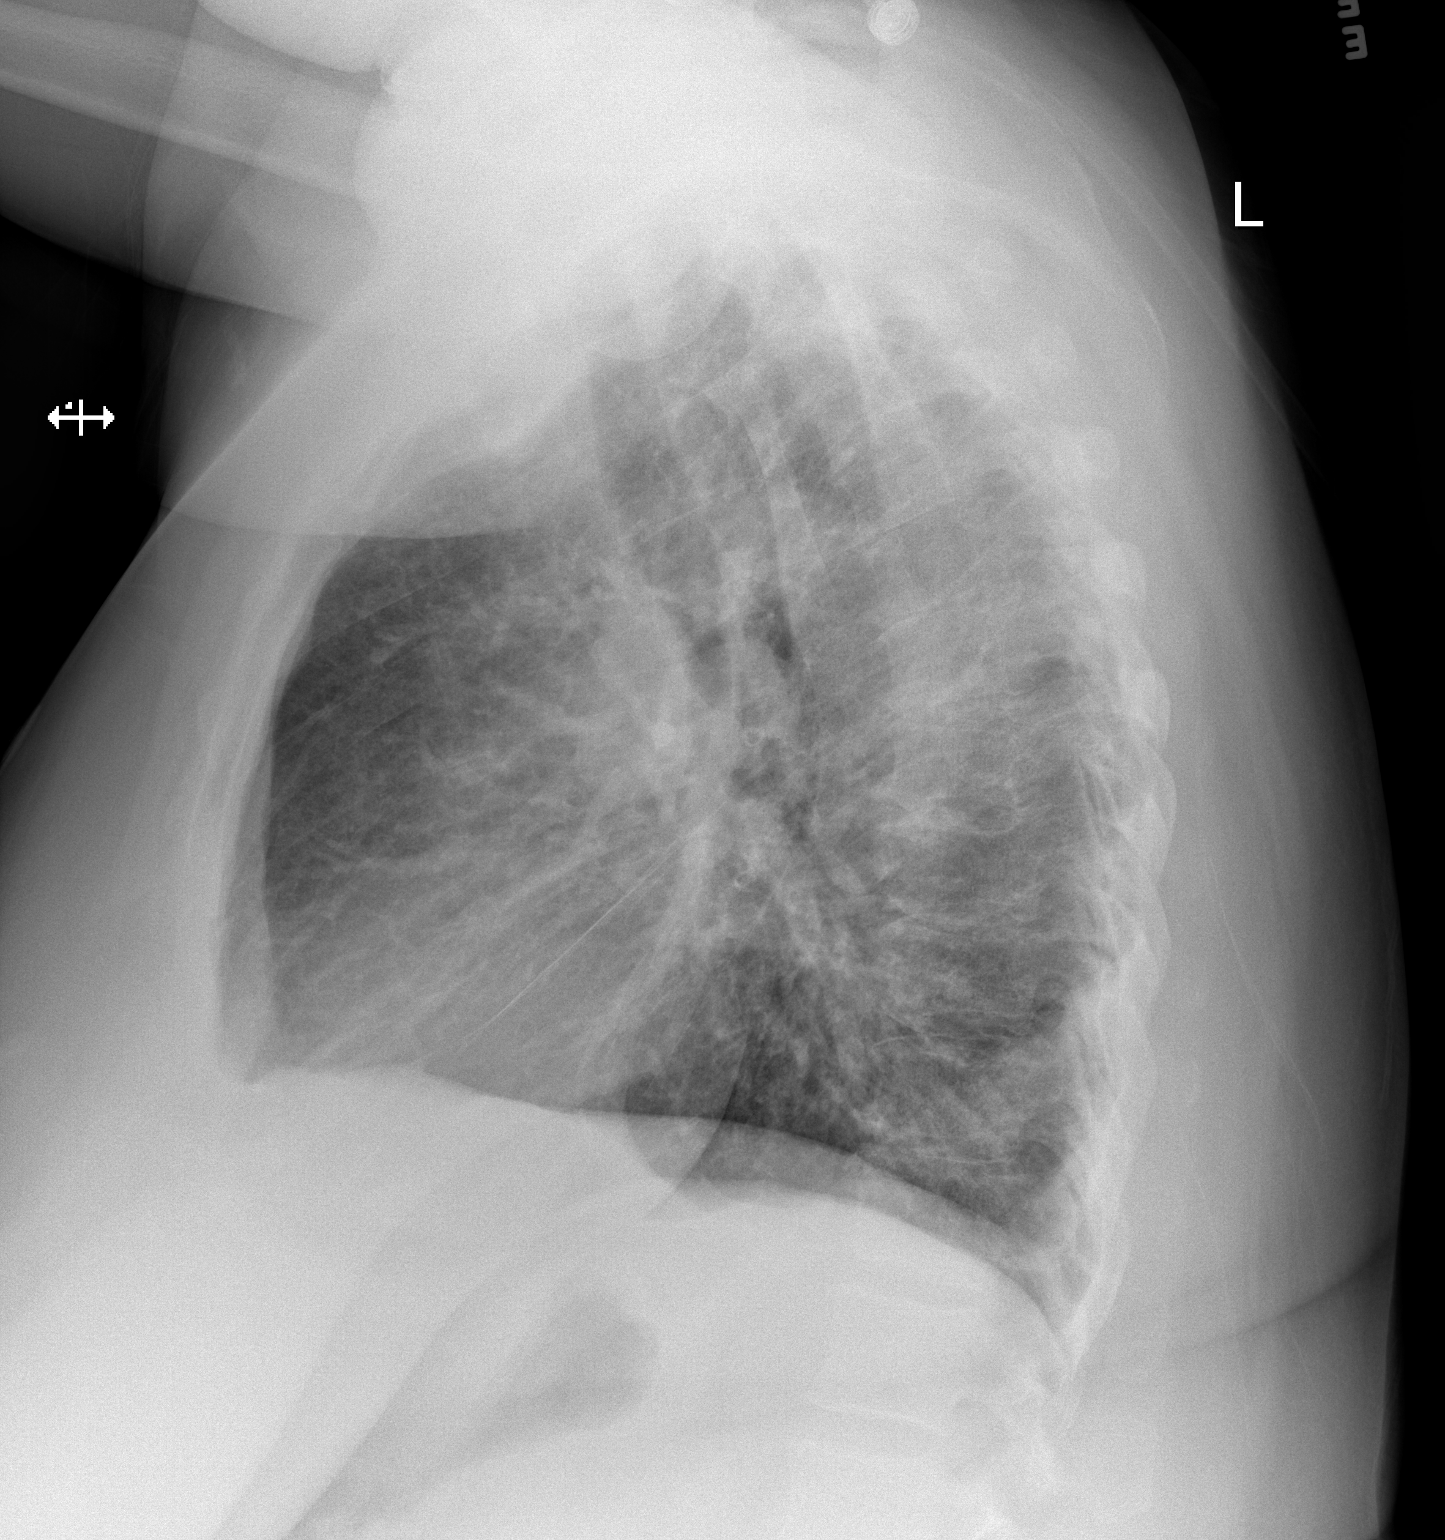

[2 of 2 positions shown; findings below may reference images not displayed]

FINDINGS: Hyperaeration.  Bronchitic changes.  Chronic interstitial
changes.  No new consolidation or mass.  No pleural effusion.  No
definite Kerley B lines to suggest edema.  Upper normal heart size.
IMPRESSION: No active cardiopulmonary disease.  Changes related to COPD are
noted.

## 2014-10-15 NOTE — Addendum Note (Signed)
Addended by: Hulan Fray on: 10/15/2014 08:29 PM   Modules accepted: Orders

## 2014-11-15 ENCOUNTER — Ambulatory Visit (INDEPENDENT_AMBULATORY_CARE_PROVIDER_SITE_OTHER): Payer: Medicaid Other | Admitting: Internal Medicine

## 2014-11-15 ENCOUNTER — Encounter: Payer: Self-pay | Admitting: Internal Medicine

## 2014-11-15 VITALS — BP 121/75 | HR 61 | Temp 98.3°F | Ht 60.0 in | Wt 226.9 lb

## 2014-11-15 DIAGNOSIS — R030 Elevated blood-pressure reading, without diagnosis of hypertension: Secondary | ICD-10-CM | POA: Diagnosis not present

## 2014-11-15 DIAGNOSIS — J449 Chronic obstructive pulmonary disease, unspecified: Secondary | ICD-10-CM | POA: Diagnosis not present

## 2014-11-15 DIAGNOSIS — J45909 Unspecified asthma, uncomplicated: Secondary | ICD-10-CM | POA: Diagnosis not present

## 2014-11-15 DIAGNOSIS — R21 Rash and other nonspecific skin eruption: Secondary | ICD-10-CM

## 2014-11-15 MED ORDER — HYDROCORTISONE 0.5 % EX CREA: 1.0000 "application " | TOPICAL_CREAM | Freq: Two times a day (BID) | CUTANEOUS | Status: DC | PRN

## 2014-11-15 NOTE — Patient Instructions (Signed)
1. I prescribed a steroid cream to use as needed for your rash.  You can try over-the-counter benadryl for the itching.  It would be a good idea to have an exterminator check your home.  Please come back to see [place] if you are still having problems.     2. Please take all medications as prescribed.    3. If you have worsening of your symptoms or new symptoms arise, please call the clinic ([phone]), or go to the ER immediately if symptoms are severe.   Insect Bite Mosquitoes, flies, fleas, bedbugs, and many other insects can bite. Insect bites are different from insect stings. A sting is when venom is injected into the skin. Some insect bites can transmit infectious diseases. SYMPTOMS  Insect bites usually turn red, swell, and itch for 2 to 4 days. They often go away on their own. TREATMENT  Your caregiver may prescribe antibiotic medicines if a bacterial infection develops in the bite. HOME CARE INSTRUCTIONS  Do not scratch the bite area.  Keep the bite area clean and dry. Wash the bite area thoroughly with soap and water.  Put ice or cool compresses on the bite area.  Put ice in a plastic bag.  Place a towel between your skin and the bag.  Leave the ice on for 20 minutes, 4 times a day for the first 2 to 3 days, or as directed.  You may apply a baking soda paste, cortisone cream, or calamine lotion to the bite area as directed by your caregiver. This can help reduce itching and swelling.  Only take over-the-counter or prescription medicines as directed by your caregiver.  If you are given antibiotics, take them as directed. Finish them even if you start to feel better. You may need a tetanus shot if:  You cannot remember when you had your last tetanus shot.  You have never had a tetanus shot.  The injury broke your skin. If you get a tetanus shot, your arm may swell, get red, and feel warm to the touch. This is common and not a problem. If you need a tetanus shot and you  choose not to have one, there is a rare chance of getting tetanus. Sickness from tetanus can be serious. SEEK IMMEDIATE MEDICAL CARE IF:   You have increased pain, redness, or swelling in the bite area.  You see a red line on the skin coming from the bite.  You have a fever.  You have joint pain.  You have a headache or neck pain.  You have unusual weakness.  You have a rash.  You have chest pain or shortness of breath.  You have abdominal pain, nausea, or vomiting.  You feel unusually tired or sleepy. MAKE SURE YOU:   Understand these instructions.  Will watch your condition.  Will get help right away if you are not doing well or get worse. Document Released: 05/13/2004 Document Revised: 06/28/2011 Document Reviewed: 11/04/2010 [facility] Patient Information 2015 [place], [place]. This information is not intended to replace advice given to you by your health care provider. Make sure you discuss any questions you have with your health care provider.

## 2014-11-15 NOTE — Progress Notes (Signed)
Subjective:    Patient ID: Maria Nelson, female    DOB: 03-14-1956, 59 y.o.   MRN: [id number]  HPI Comments: [name] is a 59 year old woman with PMH as below here with c/o rash x 2 months.  Please see problem based charting for assessment and plan.    Past Medical History  Diagnosis Date  . COPD (chronic obstructive pulmonary disease)     secondary to tobacco use // No PFTs on file  . Gastrointestinal hemorrhage     secondary to PUD on March 2008  . Peptic ulcer disease     +H. pylori (antigen)- Dr. Olevia Perches- treated  . ETOH abuse     Pt stopped in 3/08  . Tobacco abuse     quit in 2010  . OSA on CPAP     noncompliant with CPAP (2/2 it being depressing)  . Urinary incontinence   . Carpal tunnel syndrome 06/23/2011    Patient notes history of bilateral carpal tunnel syndrome.  Now has symptoms of right hand carpal tunnel.   . 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 STRESS INCONTINENCE 01/24/2007    Current Outpatient Prescriptions on File Prior to Visit  Medication Sig Dispense Refill  . albuterol (PROVENTIL HFA;VENTOLIN HFA) 108 (90 BASE) MCG/ACT inhaler Inhale 2 puffs into the lungs every 6 (six) hours as needed for wheezing. 3.7 g 3  . albuterol (PROVENTIL) (2.5 MG/3ML) 0.083% nebulizer solution Use 1 vial in nebulizer every 1 hours as needed for wheezing 75 mL 3  . beclomethasone (QVAR) 80 MCG/ACT inhaler Inhale 2 puffs into the lungs 2 (two) times daily.    . cetirizine (ZYRTEC ALLERGY) 10 MG tablet Take 1 tablet (10 mg total) by mouth daily. 30 tablet 5  . Guaifenesin 1200 MG TB12 Take 1 tablet (1,200 mg total) by mouth 2 (two) times daily. 20 each 0  . guaiFENesin-dextromethorphan (ROBITUSSIN DM) 100-10 MG/5ML syrup Take 5 mLs by mouth every 4 (four) hours as needed for cough. 118 mL 0  . hydrOXYzine (VISTARIL) 25 MG capsule Take 4 capsules (100 mg total) by mouth once as needed for itching (facial swelling). 30 capsule 4  . ipratropium (ATROVENT) 0.02 % nebulizer solution  INHALE ONE VIAL BY NEBULIZER EVERY 6 HOURS AS NEEDED 2.5 mL 3  . mometasone-formoterol (DULERA) 200-5 MCG/ACT AERO Inhale 2 puffs into the lungs 2 (two) times daily.    . pantoprazole (PROTONIX) 40 MG tablet Take 1 tablet (40 mg total) by mouth daily. 30 tablet 11  . predniSONE (DELTASONE) 20 MG tablet Take 2 tablets (40 mg total) by mouth daily with breakfast. 3 tablet 0   No current facility-administered medications on file prior to visit.    Review of Systems  Constitutional: Negative for fever, chills and appetite change.  Respiratory: Negative for shortness of breath.   Cardiovascular: Negative for chest pain.  Musculoskeletal: Negative for myalgias and arthralgias.  Skin: Positive for rash.       Itchy, not painful  Neurological: Negative for headaches.       Filed Vitals:   11/15/14 1355  BP: 121/75  Pulse: 61  Temp: 98.3 F (36.8 C)  TempSrc: Oral  Height: 5' (1.524 m)  Weight: 226 lb 14.4 oz (102.921 kg)  SpO2: 99%     Objective:   Physical Exam  Constitutional: She is oriented to person, place, and time. She appears well-developed. No distress.  HENT:  Head: Normocephalic and atraumatic.  Mouth/Throat: Oropharynx is clear and moist. No oropharyngeal exudate.  Eyes: EOM are normal. Pupils are equal, round, and reactive to light.  Neck: Neck supple.  Cardiovascular: Normal rate, regular rhythm and normal heart sounds.  Exam reveals no gallop and no friction rub.   No murmur heard. Pulmonary/Chest: Effort normal and breath sounds normal. No respiratory distress. She has no wheezes. She has no rales.  Abdominal: Soft. Bowel sounds are normal. She exhibits no distension and no mass. There is no tenderness. There is no rebound and no guarding.  Musculoskeletal: Normal range of motion. She exhibits no edema or tenderness.  Neurological: She is alert and oriented to person, place, and time. No cranial nerve deficit.  Skin: Skin is warm. Rash noted. She is not  diaphoretic. There is erythema.  Small bites on right deltoid.  Small bites and urticaria on right breast.  Psychiatric: She has a normal mood and affect. Her behavior is normal.  Vitals reviewed.         Assessment & Plan:  Please see problem based charting for assessment and plan.

## 2014-11-17 NOTE — Assessment & Plan Note (Addendum)
Itchy bites and rash on right arm x 2 months.  She initially noticed itchy bites near right cubital fossa, those healed and more recently she has had the same appear on upper right arm (over deltoid) and right breast.  She denies recent travel, new clothing, hotel stay or staying at anyone else's home and the bites are not linear making bed bug bites less likely.  She has not seen bugs in her home.  OTC steroid cream not helping.  There are scabbed over bites on the right arm and pink (urticarial rash) with two small red bites on right breast.  Less likely flee bites given the location on arm (not legs).  Also, unusual that bites are confined to right arm and now chest.  Scabies unlikely given location.  Very unlikely to be tick bite since there are several, she has not been outdoors and no dogs in the home.  Theses are clear bites, not vesicles, no pain, 2-3 dermatomes involved making shingles very unlikely.  She has no systemic symptoms only local itching. - rx hydrocortisone cream prn use, no more than 2 weeks - OTC benadryl prn - advised she have home inspection by exterminator (to r/o budbugs, fleas) - she will return if problem persists or if she develops systemic symptoms

## 2014-11-18 NOTE — Progress Notes (Signed)
Medicine attending: Medical history, presenting problems, physical findings, and medications, reviewed with Dr Alex Wilson on the day of the patient visit   and I concur with her evaluation and management plan. 

## 2014-11-27 ENCOUNTER — Ambulatory Visit (INDEPENDENT_AMBULATORY_CARE_PROVIDER_SITE_OTHER): Payer: Medicaid Other | Admitting: Internal Medicine

## 2014-11-27 VITALS — BP 115/73 | HR 63 | Temp 98.5°F | Ht 60.0 in | Wt 230.6 lb

## 2014-11-27 DIAGNOSIS — R21 Rash and other nonspecific skin eruption: Secondary | ICD-10-CM

## 2014-11-27 DIAGNOSIS — J449 Chronic obstructive pulmonary disease, unspecified: Secondary | ICD-10-CM | POA: Diagnosis present

## 2014-11-27 DIAGNOSIS — R030 Elevated blood-pressure reading, without diagnosis of hypertension: Secondary | ICD-10-CM | POA: Diagnosis not present

## 2014-11-27 DIAGNOSIS — J45909 Unspecified asthma, uncomplicated: Secondary | ICD-10-CM | POA: Diagnosis not present

## 2014-11-27 MED ORDER — TERBINAFINE HCL 1 % EX CREA
1.0000 | TOPICAL_CREAM | Freq: Two times a day (BID) | CUTANEOUS | Status: DC
Start: 2014-11-27 — End: 2017-08-24

## 2014-11-27 NOTE — Progress Notes (Signed)
Patient ID: Maria Nelson, female   DOB: 1955-06-03, 59 y.o.   MRN: [id number]   Subjective:   Patient ID: Maria Nelson female   DOB: 1955/12/16 59 y.o.   MRN: [id number]  HPI: [name] is a 59 y.o. female with a PMH of COPD, OSA, Peptic ulcer disease here today with continued complaints of a rash.   She was seen in clinic on 7/29 with this rash. She reports it started 1-2 months ago. Reports that she initially noticed it after she was working in her yard and was bitten by an insect. Initially started in her right antecubital fossa at the site of the insect bite, then spread up her right deltoid and to her right breast. She reports only itching at the site of the rash. No pain or lesions. No systemic symptoms.   She was given a hydrocortisone cream at her last visit and told to use benadryl as needed. She says that the cream works for 5-10 minutes after first putting it on, relieving the itching, but then quickly wears off. Has not been using benadryl as it makes her too sleepy. Does note that in the past she has had a fungal infection on her abdomen that looked and felt similar to this rash.    Past Medical History  Diagnosis Date  . COPD (chronic obstructive pulmonary disease)     secondary to tobacco use // No PFTs on file  . Gastrointestinal hemorrhage     secondary to PUD on March 2008  . Peptic ulcer disease     +H. pylori (antigen)- Dr. Olevia Perches- treated  . ETOH abuse     Pt stopped in 3/08  . Tobacco abuse     quit in 2010  . OSA on CPAP     noncompliant with CPAP (2/2 it being depressing)  . Urinary incontinence   . Carpal tunnel syndrome 06/23/2011    Patient notes history of bilateral carpal tunnel syndrome.  Now has symptoms of right hand carpal tunnel.   . 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 STRESS INCONTINENCE 01/24/2007   Current Outpatient Prescriptions  Medication Sig Dispense Refill  . albuterol (PROVENTIL HFA;VENTOLIN HFA) 108 (90 BASE) MCG/ACT inhaler  Inhale 2 puffs into the lungs every 6 (six) hours as needed for wheezing. 3.7 g 3  . albuterol (PROVENTIL) (2.5 MG/3ML) 0.083% nebulizer solution Use 1 vial in nebulizer every 1 hours as needed for wheezing 75 mL 3  . beclomethasone (QVAR) 80 MCG/ACT inhaler Inhale 2 puffs into the lungs 2 (two) times daily.    . cetirizine (ZYRTEC ALLERGY) 10 MG tablet Take 1 tablet (10 mg total) by mouth daily. 30 tablet 5  . hydrocortisone cream 0.5 % Apply 1 application topically 2 (two) times daily as needed for itching. 15 g 0  . ipratropium (ATROVENT) 0.02 % nebulizer solution INHALE ONE VIAL BY NEBULIZER EVERY 6 HOURS AS NEEDED 2.5 mL 3  . mometasone-formoterol (DULERA) 200-5 MCG/ACT AERO Inhale 2 puffs into the lungs 2 (two) times daily.    . pantoprazole (PROTONIX) 40 MG tablet Take 1 tablet (40 mg total) by mouth daily. 30 tablet 11  . terbinafine (LAMISIL) 1 % cream Apply 1 application topically 2 (two) times daily. 30 g 0   No current facility-administered medications for this visit.   Family History  Problem Relation Age of Onset  . Diabetes Sister   . Breast cancer Sister 79    died 2/2 breast ca age 37-60  . Diabetes Mother   .  Coronary artery disease Mother 13    requiring 5 vessel CABG   Social History   Social History  . Marital Status: Single    Spouse Name: N/A  . Number of Children: 3  . Years of Education: 12th grade   Occupational History  . Disability     previously worked in [employer] had to stop 2/2 occupational exposures leading to exacerbations of asthma    Social History Main Topics  . Smoking status: Former Smoker -- 0.40 packs/day    Types: Cigarettes    Quit date: 12/04/2010  . Smokeless tobacco: Not on file     Comment: stopped 3 weeks ago  . Alcohol Use: 0.0 oz/week    0 Standard drinks or equivalent per week     Comment: now drinking a beer on weekends //   . Drug Use: No  . Sexual Activity: Not on file   Other Topics Concern  . Not on file   Social  History Narrative   Pt is not working now, was previously a Scientist, water quality at [employer].   Pt lives with her mother      Pt has received financial assistance approval for 100% discount at [facility] and has [facility] card.    Review of Systems: Review of Systems  Constitutional: Negative for fever and chills.  Respiratory: Negative for cough and shortness of breath.   Cardiovascular: Negative for chest pain and palpitations.  Gastrointestinal: Negative for nausea, vomiting, abdominal pain and diarrhea.  Genitourinary: Negative for dysuria.  Musculoskeletal: Negative for myalgias.  Skin: Positive for itching and rash.  Neurological: Negative for headaches.   Objective:  Physical Exam: Filed Vitals:   11/27/14 1431  BP: 115/73  Pulse: 63  Temp: 98.5 F (36.9 C)  TempSrc: Oral  Height: 5' (1.524 m)  Weight: 230 lb 9.6 oz (104.599 kg)  SpO2: 99%   Physical Exam  Constitutional: She is oriented to person, place, and time. She appears well-developed. No distress.  HENT:  Head: Normocephalic and atraumatic.  Mouth/Throat: Oropharynx is clear and moist. No oropharyngeal exudate.  Eyes: EOM are normal. Pupils are equal, round, and reactive to light.  Neck: Neck supple.  Cardiovascular: Normal rate, regular rhythm and normal heart sounds. Exam reveals no gallop and no friction rub.  No murmur heard. Pulmonary/Chest: Effort normal and breath sounds normal. No respiratory distress. She has no wheezes. She has no rales.  Abdominal: Soft. Bowel sounds are normal. She exhibits no distension and no mass. There is no tenderness. There is no rebound and no guarding.  Musculoskeletal: Normal range of motion. She exhibits no edema or tenderness.  Neurological: She is alert and oriented to person, place, and time. No cranial nerve deficit.  Skin: Skin is warm. Bright, erythematous rash on right deltoid extending to the shoulder blade. Scattered erythema on right breast and left hip. No  vesicles or lesions present. No urticaria.  Psychiatric: She has a normal mood and affect. Her behavior is normal.  Vitals reviewed.  Assessment & Plan:   Case discussed with Dr. Lynnae January. Please refer to Problem based carting for further details of today's visit.

## 2014-11-27 NOTE — Patient Instructions (Signed)
Thank you for coming in today.  I have sent in a prescription for an anti fungal cream. Please stop using the hydrocortisone cream. The rash may get worse initially when you stop using the hydrocortisone cream if this is a fungal infection but the anti fungal cream should treat the infection. We have sent the sample off to the lab and will follow up the results of that. If the rash does not improve in 1-2 weeks, schedule a follow up visit with [place] and we will need to get you a referral to Dermatology.  If you have worsening of your symptoms or new symptoms arise, please call the clinic [phone]), or go to the ER immediately if symptoms are severe.

## 2014-11-28 NOTE — Assessment & Plan Note (Signed)
She was seen in clinic on 7/29 with this rash. She reports it started 1-2 months ago. Reports that she initially noticed it after she was working in her yard and was bitten by an insect. Initially started in her right antecubital fossa at the site of the insect bite, then spread up her right deltoid and to her right breast. She reports only itching at the site of the rash. No pain or lesions. No systemic symptoms.   She was given a hydrocortisone cream at her last visit and told to use benadryl as needed. She says that the cream works for 5-10 minutes after first putting it on, relieving the itching, but then quickly wears off. Has not been using benadryl as it makes her too sleepy. Does note that in the past she has had a fungal infection on her abdomen that looked and felt similar to this rash.   - Took a skin scraping, will send off to lab for staining - Will d/c hydrocortisone cream - Will try terbinafine cream for possible fungal infection - If no improvement and no results with the skin scraping, will likely need a referral to dermatology

## 2014-11-28 NOTE — Progress Notes (Signed)
Internal Medicine Clinic Attending  I saw and evaluated the patient.  I personally confirmed the key portions of the history and exam documented by Dr. Boswell and I reviewed pertinent patient test results.  The assessment, diagnosis, and plan were formulated together and I agree with the documentation in the resident's note. 

## 2014-11-29 LAB — FUNGUS STAIN

## 2015-03-04 ENCOUNTER — Ambulatory Visit (INDEPENDENT_AMBULATORY_CARE_PROVIDER_SITE_OTHER): Payer: Medicaid Other | Admitting: Allergy and Immunology

## 2015-03-04 ENCOUNTER — Encounter: Payer: Self-pay | Admitting: Allergy and Immunology

## 2015-03-04 VITALS — BP 120/80 | HR 74 | Resp 18

## 2015-03-04 DIAGNOSIS — J449 Chronic obstructive pulmonary disease, unspecified: Secondary | ICD-10-CM | POA: Diagnosis not present

## 2015-03-04 DIAGNOSIS — J45909 Unspecified asthma, uncomplicated: Secondary | ICD-10-CM | POA: Diagnosis not present

## 2015-03-04 DIAGNOSIS — J3089 Other allergic rhinitis: Secondary | ICD-10-CM | POA: Insufficient documentation

## 2015-03-04 DIAGNOSIS — K219 Gastro-esophageal reflux disease without esophagitis: Secondary | ICD-10-CM | POA: Diagnosis not present

## 2015-03-04 DIAGNOSIS — J302 Other seasonal allergic rhinitis: Secondary | ICD-10-CM | POA: Insufficient documentation

## 2015-03-04 MED ORDER — MOMETASONE FURO-FORMOTEROL FUM 200-5 MCG/ACT IN AERO: INHALATION_SPRAY | RESPIRATORY_TRACT | Status: DC

## 2015-03-04 MED ORDER — BECLOMETHASONE DIPROPIONATE 80 MCG/ACT IN AERS: INHALATION_SPRAY | RESPIRATORY_TRACT | Status: DC

## 2015-03-04 NOTE — Assessment & Plan Note (Addendum)
   Continue Dulera 200/5 g, 2 inhalations via spacer device twice a day.  A refill prescription has been provided.  For now, add Qvar 80 g, one inhalation via spacer device twice a day, until symptoms have returned to baseline.  A refill prescription has been provided.  Continue albuterol/ipratropium every 4-6 hours as needed.  Subjective and objective measures of pulmonary function will be followed and the treatment plan will be adjusted accordingly.  The patient has been asked to contact me if her symptoms persist or progress. Otherwise, she may return for follow up in 4 months.

## 2015-03-04 NOTE — Assessment & Plan Note (Signed)
   Continue pantoprazole 40 mg daily as prescribed.  A refill prescription has been provided.

## 2015-03-04 NOTE — Progress Notes (Signed)
History of present illness: HPI Comments: Maria Nelson is a 59 y.o. female with persistent asthma/COPD, allergic rhinitis, reflux, history of dermatitis, and history of angioedema presents today for a sick visit.  She reports that her lower respiratory symptoms have been well-controlled throughout the summer, however she has been experiencing more frequent wheezing this fall.  She is primarily triggered by smoke from chimneys and burning leaves when she is outdoors walking.  Other triggers include strong aromas and detergents.  She denies nocturnal awakenings due to lower respiratory symptoms.  She has been experiencing some increased nasal congestion and postnasal drainage.  She does not have a nasal spray currently.  She complained of dermatitis on her last visit, however this problem has resolved.  In addition, she has had no recurrence of angioedema in the interval since her previous visit.  She reports that her reflux is well-controlled with pantoprazole.   Assessment and plan: Asthma with COPD  Continue Dulera 200/5 g, 2 inhalations via spacer device twice a day.  A refill prescription has been provided.  For now, add Qvar 80 g, one inhalation via spacer device twice a day, until symptoms have returned to baseline.  A refill prescription has been provided.  Continue albuterol/ipratropium every 4-6 hours as needed.  Subjective and objective measures of pulmonary function will be followed and the treatment plan will be adjusted accordingly.  The patient has been asked to contact me if her symptoms persist or progress. Otherwise, she may return for follow up in 4 months.  Allergic rhinitis with nonallergic component  A prescription has been provided for Astepro (azelastine) nasal spray, 1-2 sprays per nostril 2 times daily as needed. Proper nasal spray technique has been discussed and demonstrated.   Nasal saline lavage as needed has been recommended along with instructions for  proper administration.  GERD  Continue pantoprazole 40 mg daily as prescribed.  A refill prescription has been provided.    Medications ordered this encounter: Meds ordered this encounter  Medications  . beclomethasone (QVAR) 80 MCG/ACT inhaler    Sig: INHALE TWO PUFFS TWICE DAILY TO PREVENT COUGH OR WHEEZE. RINSE, GARGLE, AND SPIT AFTER USE.    Dispense:  1 Inhaler    Refill:  4  . mometasone-formoterol (DULERA) 200-5 MCG/ACT AERO    Sig: INHALE TWO PUFFS TWICE DAILY TO PREVENT COUGH OR WHEEZE. RINSE, GARGLE, AND SPIT AFTER USE.    Dispense:  1 Inhaler    Refill:  4    Diagnositics: Spirometry: FVC is 1.94 L (85% predicted) and FEV1 is 1.23 L (66% predicted) with 14% post-bronchial dilator improvement.     Physical examination: Blood pressure 120/80, pulse 74, resp. rate 18.  General: Alert, interactive, in no acute distress. HEENT: TMs pearly gray, turbinates moderately edematous without discharge, post-pharynx moderately erythematous. Neck: Supple without lymphadenopathy. Lungs: Mildly decreased breath sounds bilaterally without wheezing, rhonchi or rales. CV: Normal S1, S2 without murmurs. Skin: Warm and dry, without lesions or rashes.  The following portions of the patient's history were reviewed and updated as appropriate: allergies, current medications, past family history, past medical history, past social history, past surgical history and problem list.  Outpatient medications:   Medication List       This list is accurate as of: 03/04/15  4:44 PM.  Always use your most recent med list.               albuterol 108 (90 BASE) MCG/ACT inhaler  Commonly known as:  PROVENTIL HFA;VENTOLIN HFA  Inhale 2 puffs into the lungs every 6 (six) hours as needed for wheezing.     albuterol (2.5 MG/3ML) 0.083% nebulizer solution  Commonly known as:  PROVENTIL  Use 1 vial in nebulizer every 1 hours as needed for wheezing     beclomethasone 80 MCG/ACT inhaler  Commonly  known as:  QVAR  INHALE TWO PUFFS TWICE DAILY TO PREVENT COUGH OR WHEEZE. RINSE, GARGLE, AND SPIT AFTER USE.     cetirizine 10 MG tablet  Commonly known as:  ZYRTEC ALLERGY  Take 1 tablet (10 mg total) by mouth daily.     desloratadine 5 MG tablet  Commonly known as:  CLARINEX  Take 5 mg by mouth daily.     EPIPEN 2-PAK 0.3 mg/0.3 mL Soaj injection  Generic drug:  EPINEPHrine  Inject into the muscle once.     hydrocortisone cream 0.5 %  Apply 1 application topically 2 (two) times daily as needed for itching.     ipratropium 0.02 % nebulizer solution  Commonly known as:  ATROVENT  INHALE ONE VIAL BY NEBULIZER EVERY 6 HOURS AS NEEDED     mometasone-formoterol 200-5 MCG/ACT Aero  Commonly known as:  DULERA  INHALE TWO PUFFS TWICE DAILY TO PREVENT COUGH OR WHEEZE. RINSE, GARGLE, AND SPIT AFTER USE.     pantoprazole 40 MG tablet  Commonly known as:  PROTONIX  Take 1 tablet (40 mg total) by mouth daily.     terbinafine 1 % cream  Commonly known as:  LAMISIL  Apply 1 application topically 2 (two) times daily.        Known medication allergies: Allergies  Allergen Reactions  . Bee Venom Anaphylaxis  . Aspirin Nausea Only    I appreciate the opportunity to take part in this Debanhi's care. Please do not hesitate to contact me with questions.  Sincerely,   R. Edgar Frisk, MD

## 2015-03-04 NOTE — Assessment & Plan Note (Addendum)
   A prescription has been provided for Astepro (azelastine) nasal spray, 1-2 sprays per nostril 2 times daily as needed. Proper nasal spray technique has been discussed and demonstrated.   Nasal saline lavage as needed has been recommended along with instructions for proper administration.

## 2015-03-04 NOTE — Patient Instructions (Signed)
Asthma with COPD  Continue Dulera 200/5 g, 2 inhalations via spacer device twice a day.  A refill prescription has been provided.  For now, add Qvar 80 g, one inhalation via spacer device twice a day, until symptoms have returned to baseline.  A refill prescription has been provided.  Continue albuterol/ipratropium every 4-6 hours as needed.  Subjective and objective measures of pulmonary function will be followed and the treatment plan will be adjusted accordingly.  The patient has been asked to contact me if her symptoms persist or progress. Otherwise, she may return for follow up in 4 months.  Allergic rhinitis with nonallergic component  A prescription has been provided for Astepro (azelastine) nasal spray, 1-2 sprays per nostril 2 times daily as needed. Proper nasal spray technique has been discussed and demonstrated.   Nasal saline lavage as needed has been recommended along with instructions for proper administration.   GERD  Continue pantoprazole 40 mg daily as prescribed.  A refill prescription has been provided.   Return in about 4 months (around 07/02/2015), or if symptoms worsen or fail to improve.

## 2015-05-07 ENCOUNTER — Other Ambulatory Visit: Payer: Self-pay | Admitting: Internal Medicine

## 2015-05-08 ENCOUNTER — Encounter: Payer: Self-pay | Admitting: Internal Medicine

## 2015-06-14 ENCOUNTER — Other Ambulatory Visit: Payer: Self-pay | Admitting: Internal Medicine

## 2015-06-30 ENCOUNTER — Ambulatory Visit: Payer: Medicaid Other | Admitting: Allergy and Immunology

## 2015-07-01 ENCOUNTER — Ambulatory Visit (INDEPENDENT_AMBULATORY_CARE_PROVIDER_SITE_OTHER): Payer: Medicaid Other | Admitting: Allergy and Immunology

## 2015-07-01 ENCOUNTER — Encounter: Payer: Self-pay | Admitting: Allergy and Immunology

## 2015-07-01 VITALS — BP 120/72 | HR 80 | Resp 16

## 2015-07-01 DIAGNOSIS — L501 Idiopathic urticaria: Secondary | ICD-10-CM

## 2015-07-01 DIAGNOSIS — J45909 Unspecified asthma, uncomplicated: Secondary | ICD-10-CM

## 2015-07-01 DIAGNOSIS — B370 Candidal stomatitis: Secondary | ICD-10-CM | POA: Diagnosis not present

## 2015-07-01 DIAGNOSIS — J3089 Other allergic rhinitis: Secondary | ICD-10-CM

## 2015-07-01 DIAGNOSIS — J449 Chronic obstructive pulmonary disease, unspecified: Secondary | ICD-10-CM

## 2015-07-01 DIAGNOSIS — K219 Gastro-esophageal reflux disease without esophagitis: Secondary | ICD-10-CM | POA: Diagnosis not present

## 2015-07-01 HISTORY — DX: Idiopathic urticaria: L50.1

## 2015-07-01 MED ORDER — BECLOMETHASONE DIPROPIONATE 80 MCG/ACT IN AERS: 1.0000 | INHALATION_SPRAY | Freq: Two times a day (BID) | RESPIRATORY_TRACT | Status: DC

## 2015-07-01 MED ORDER — CLOTRIMAZOLE 10 MG MT TROC: OROMUCOSAL | Status: DC

## 2015-07-01 MED ORDER — MOMETASONE FURO-FORMOTEROL FUM 200-5 MCG/ACT IN AERO: INHALATION_SPRAY | RESPIRATORY_TRACT | Status: DC

## 2015-07-01 MED ORDER — IPRATROPIUM BROMIDE 0.02 % IN SOLN: RESPIRATORY_TRACT | Status: DC

## 2015-07-01 MED ORDER — AZELASTINE HCL 0.15 % NA SOLN: NASAL | Status: DC

## 2015-07-01 NOTE — Assessment & Plan Note (Signed)
   Continue appropriate lifestyle modifications and proton pump inhibitor as prescribed. 

## 2015-07-01 NOTE — Assessment & Plan Note (Signed)
Isolated episode of acute urticaria which has resolved without recurrence. Most likely secondary to viral infection.  Should symptoms recur, a journal is to be kept recording any foods eaten, beverages consumed, medications taken, activities performed, and environmental conditions within a 6 hour time period prior to the onset of symptoms. For any symptoms concerning for anaphylaxis, epinephrine is to be administered and 911 is to be called immediately.

## 2015-07-01 NOTE — Assessment & Plan Note (Addendum)
Well controlled on current regimen.  Continue Dulera 200/5 g, 2 inhalations via spacer device twice a day.  A refill prescription has been provided.  During respiratory tract infections and asthma/COPD flares, add Qvar 80 g, one inhalation via spacer device twice a day, until symptoms have returned to baseline.   Continue albuterol/ipratropium every 4-6 hours as needed.  Subjective and objective measures of pulmonary function will be followed and the treatment plan will be adjusted accordingly.

## 2015-07-01 NOTE — Patient Instructions (Addendum)
Asthma with COPD Well controlled on current regimen.  Continue Dulera 200/5 g, 2 inhalations via spacer device twice a day.  A refill prescription has been provided.  During respiratory tract infections and asthma/COPD flares, add Qvar 80 g, one inhalation via spacer device twice a day, until symptoms have returned to baseline.   Continue albuterol/ipratropium every 4-6 hours as needed.  Subjective and objective measures of pulmonary function will be followed and the treatment plan will be adjusted accordingly.  Oral candidiasis  The importance of appropriate oral hygiene after inhaled corticosteroid use has been discussed and emphasized.  A prescription has been provided for clotrimazole 10 mg troches: Slowly dissolve in the mouth 5 times per day for 10 days, not to be chewed or swallowed whole.  Allergic rhinitis with nonallergic component  Continue appropriate allergen avoidance measures, azelastine nasal spray as needed, and nasal saline irrigation as needed.  GERD  Continue appropriate lifestyle modifications and proton pump inhibitor as prescribed.  Urticaria Isolated episode of acute urticaria which has resolved without recurrence. Most likely secondary to viral infection.  Should symptoms recur, a journal is to be kept recording any foods eaten, beverages consumed, medications taken, activities performed, and environmental conditions within a 6 hour time period prior to the onset of symptoms. For any symptoms concerning for anaphylaxis, epinephrine is to be administered and 911 is to be called immediately.     Return in about 4 months (around 10/31/2015), or if symptoms worsen or fail to improve.

## 2015-07-01 NOTE — Progress Notes (Signed)
Follow-up Note  RE: Maria Nelson MRN: [id number] DOB: [birth year] Date of Office Visit: 07/01/2015  Primary care provider: Maryellen Pile, MD Referring provider: Maryellen Pile, MD  History of present illness: HPI Comments: Maria Nelson is a 60 y.o. female with asthma/COPD, mixed rhinitis, and acid reflux who presents today for follow up.  She reports that in January she developed hives on her shoulders, chest, and abdomen.  The hives were described as erythematous, raised, and pruritic.  The hives resolved within 24 hours without residual pigmentation or bruising.  She did not experience concomitant cardiopulmonary or GI symptoms.  The hives occurred in context of influenza.  She reports that her asthma/COPD has been generally well controlled in the interval since her previous visit with the exception of the 7-10 days when she had the flu.  She does not recall if she received the influenza vaccine this year.  She complains of a whitish appearance of her tongue and sore tongue over the past few days.  She has no specific nasal or reflux related complaints today.   Assessment and plan: Asthma with COPD Well controlled on current regimen.  Continue Dulera 200/5 g, 2 inhalations via spacer device twice a day.  A refill prescription has been provided.  During respiratory tract infections and asthma/COPD flares, add Qvar 80 g, one inhalation via spacer device twice a day, until symptoms have returned to baseline.   Continue albuterol/ipratropium every 4-6 hours as needed.  Subjective and objective measures of pulmonary function will be followed and the treatment plan will be adjusted accordingly.  Oral candidiasis  The importance of appropriate oral hygiene after inhaled corticosteroid use has been discussed and emphasized.  A prescription has been provided for clotrimazole 10 mg troches: Slowly dissolve in the mouth 5 times per day for 10 days, not to be chewed or  swallowed whole.  Allergic rhinitis with nonallergic component  Continue appropriate allergen avoidance measures, azelastine nasal spray as needed, and nasal saline irrigation as needed.  GERD  Continue appropriate lifestyle modifications and proton pump inhibitor as prescribed.  Urticaria Isolated episode of acute urticaria which has resolved without recurrence. Most likely secondary to viral infection.  Should symptoms recur, a journal is to be kept recording any foods eaten, beverages consumed, medications taken, activities performed, and environmental conditions within a 6 hour time period prior to the onset of symptoms. For any symptoms concerning for anaphylaxis, epinephrine is to be administered and 911 is to be called immediately.     Meds ordered this encounter  Medications  . mometasone-formoterol (DULERA) 200-5 MCG/ACT AERO    Sig: INHALE TWO PUFFS TWICE DAILY TO PREVENT COUGH OR WHEEZE. RINSE, GARGLE, AND SPIT AFTER USE.    Dispense:  1 Inhaler    Refill:  3  . beclomethasone (QVAR) 80 MCG/ACT inhaler    Sig: Inhale 1 puff into the lungs 2 (two) times daily. During Asthma/COPD flares. Rinse, gargle and spit with water after use.    Dispense:  1 Inhaler    Refill:  3  . clotrimazole (MYCELEX) 10 MG troche    Sig: Slowly dissolve in the mouth 5 times per day for 10 days, not to be chewed or swallowed whole.    Dispense:  50 tablet    Refill:  0  . ipratropium (ATROVENT) 0.02 % nebulizer solution    Sig: INHALE ONE VIAL BY NEBULIZER EVERY 6 HOURS AS NEEDED    Dispense:  75 mL    Refill:  1  .  Azelastine HCl 0.15 % SOLN    Sig: 1-2 sprays in each nostril 1-2 times daily as needed.    Dispense:  30 mL    Refill:  3    Diagnositics: Spirometry reveals FVC of 1.67 L (77% predicted) and FEV1 of 1.23 L (69% predicted).    Physical examination: Blood pressure 120/72, pulse 80, resp. rate 16.  General: Alert, interactive, in no acute distress. HEENT: TMs pearly gray,  turbinates mildly edematous without discharge, post-pharynx mildly erythematous. Thin white plaque on tongue. Neck: Supple without lymphadenopathy. Lungs: Clear to auscultation without wheezing, rhonchi or rales. CV: Normal S1, S2 without murmurs. Skin: Warm and dry, without lesions or rashes.  The following portions of the patient's history were reviewed and updated as appropriate: allergies, current medications, past family history, past medical history, past social history, past surgical history and problem list.    Medication List       This list is accurate as of: 07/01/15  6:27 PM.  Always use your most recent med list.               albuterol 108 (90 Base) MCG/ACT inhaler  Commonly known as:  PROVENTIL HFA;VENTOLIN HFA  Inhale 2 puffs into the lungs every 6 (six) hours as needed for wheezing.     albuterol (2.5 MG/3ML) 0.083% nebulizer solution  Commonly known as:  PROVENTIL  Use 1 vial in nebulizer every 1 hours as needed for wheezing     Azelastine HCl 0.15 % Soln  1-2 sprays in each nostril 1-2 times daily as needed.     beclomethasone 80 MCG/ACT inhaler  Commonly known as:  QVAR  INHALE TWO PUFFS TWICE DAILY TO PREVENT COUGH OR WHEEZE. RINSE, GARGLE, AND SPIT AFTER USE.     beclomethasone 80 MCG/ACT inhaler  Commonly known as:  QVAR  Inhale 1 puff into the lungs 2 (two) times daily. During Asthma/COPD flares. Rinse, gargle and spit with water after use.     cetirizine 10 MG tablet  Commonly known as:  ZYRTEC  TAKE ONE TABLET BY MOUTH ONCE DAILY     clotrimazole 10 MG troche  Commonly known as:  MYCELEX  Slowly dissolve in the mouth 5 times per day for 10 days, not to be chewed or swallowed whole.     desloratadine 5 MG tablet  Commonly known as:  CLARINEX  Take 5 mg by mouth daily.     EPIPEN 2-PAK 0.3 mg/0.3 mL Soaj injection  Generic drug:  EPINEPHrine  Inject into the muscle once.     hydrocortisone cream 0.5 %  Apply 1 application topically 2 (two)  times daily as needed for itching.     ipratropium 0.02 % nebulizer solution  Commonly known as:  ATROVENT  INHALE ONE VIAL BY NEBULIZER EVERY 6 HOURS AS NEEDED     mometasone-formoterol 200-5 MCG/ACT Aero  Commonly known as:  DULERA  INHALE TWO PUFFS TWICE DAILY TO PREVENT COUGH OR WHEEZE. RINSE, GARGLE, AND SPIT AFTER USE.     pantoprazole 40 MG tablet  Commonly known as:  PROTONIX  Take 1 tablet (40 mg total) by mouth daily.     terbinafine 1 % cream  Commonly known as:  LAMISIL  Apply 1 application topically 2 (two) times daily.        Allergies  Allergen Reactions  . Bee Venom Anaphylaxis  . Aspirin Nausea Only   Review of systems: Constitutional: Negative for fever, chills and weight loss.  HENT: Negative for  nosebleeds.   Positive for thrush. Eyes: Negative for blurred vision.  Respiratory: Negative for hemoptysis.   Cardiovascular: Negative for chest pain.  Gastrointestinal: Negative for diarrhea and constipation.  Positive for reflux. Genitourinary: Negative for dysuria.  Musculoskeletal: Negative for myalgias and joint pain.  Neurological: Negative for dizziness.  Endo/Heme/Allergies: Does not bruise/bleed easily.   Past Medical History  Diagnosis Date  . COPD (chronic obstructive pulmonary disease) ([place])     secondary to tobacco use // No PFTs on file  . Gastrointestinal hemorrhage     secondary to PUD on March 2008  . Peptic ulcer disease     +H. pylori (antigen)- Dr. Olevia Perches- treated  . ETOH abuse     Pt stopped in 3/08  . Tobacco abuse     quit in 2010  . OSA on CPAP     noncompliant with CPAP (2/2 it being depressing)  . Urinary incontinence   . Carpal tunnel syndrome 06/23/2011    Patient notes history of bilateral carpal tunnel syndrome.  Now has symptoms of right hand carpal tunnel.   . 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 STRESS INCONTINENCE 01/24/2007    Family History  Problem Relation Age of Onset  . Diabetes Sister   . Breast cancer Sister 51     died 2/2 breast ca age 33-60  . Diabetes Mother   . Coronary artery disease Mother 20    requiring 5 vessel CABG    Social History   Social History  . Marital Status: Single    Spouse Name: N/A  . Number of Children: 3  . Years of Education: 12th grade   Occupational History  . Disability     previously worked in [employer] had to stop 2/2 occupational exposures leading to exacerbations of asthma    Social History Main Topics  . Smoking status: Former Smoker -- 0.40 packs/day    Types: Cigarettes    Quit date: 12/04/2010  . Smokeless tobacco: Not on file     Comment: stopped 3 weeks ago  . Alcohol Use: 0.0 oz/week    0 Standard drinks or equivalent per week     Comment: now drinking a beer on weekends //   . Drug Use: No  . Sexual Activity: Not on file   Other Topics Concern  . Not on file   Social History Narrative   Pt is not working now, was previously a Scientist, water quality at [employer].   Pt lives with her mother      Pt has received financial assistance approval for 100% discount at [facility] and has [facility] card.     I appreciate the opportunity to take part in this Maria Nelson's care. Please do not hesitate to contact me with questions.  Sincerely,   R. Edgar Frisk, MD

## 2015-07-01 NOTE — Assessment & Plan Note (Signed)
   The importance of appropriate oral hygiene after inhaled corticosteroid use has been discussed and emphasized.  A prescription has been provided for clotrimazole 10 mg troches: Slowly dissolve in the mouth 5 times per day for 10 days, not to be chewed or swallowed whole.

## 2015-07-01 NOTE — Assessment & Plan Note (Signed)
   Continue appropriate allergen avoidance measures, azelastine nasal spray as needed, and nasal saline irrigation as needed.

## 2015-07-01 NOTE — Assessment & Plan Note (Signed)
>>  ASSESSMENT AND PLAN FOR URTICARIA WRITTEN ON 07/01/2015  6:26 PM BY BOBBITT, RALPH CARTER, MD  Isolated episode of acute urticaria which has resolved without recurrence. Most likely secondary to viral infection. Should symptoms recur, a journal is to be kept recording any foods eaten, beverages consumed, medications taken, activities performed, and environmental conditions within a 6 hour time period prior to the onset of symptoms. For any symptoms concerning for anaphylaxis, epinephrine is to be administered and 911 is to be called immediately.

## 2015-07-07 ENCOUNTER — Encounter: Payer: Self-pay | Admitting: *Deleted

## 2015-07-22 ENCOUNTER — Other Ambulatory Visit: Payer: Self-pay | Admitting: Internal Medicine

## 2015-07-25 ENCOUNTER — Other Ambulatory Visit: Payer: Self-pay

## 2015-07-25 DIAGNOSIS — Z1231 Encounter for screening mammogram for malignant neoplasm of breast: Secondary | ICD-10-CM

## 2015-08-12 ENCOUNTER — Ambulatory Visit
Admission: RE | Admit: 2015-08-12 | Discharge: 2015-08-12 | Disposition: A | Discharge status: Home / Self Care | Payer: Medicaid Other | Source: Ambulatory Visit

## 2015-08-12 DIAGNOSIS — Z1231 Encounter for screening mammogram for malignant neoplasm of breast: Secondary | ICD-10-CM

## 2015-09-10 ENCOUNTER — Ambulatory Visit (INDEPENDENT_AMBULATORY_CARE_PROVIDER_SITE_OTHER): Payer: Medicaid Other | Admitting: Internal Medicine

## 2015-09-10 ENCOUNTER — Encounter: Payer: Self-pay | Admitting: Internal Medicine

## 2015-09-10 VITALS — BP 118/70 | HR 65 | Temp 98.1°F | Ht 60.0 in | Wt 244.6 lb

## 2015-09-10 DIAGNOSIS — M5126 Other intervertebral disc displacement, lumbar region: Secondary | ICD-10-CM

## 2015-09-10 DIAGNOSIS — R21 Rash and other nonspecific skin eruption: Secondary | ICD-10-CM

## 2015-09-10 DIAGNOSIS — Z6841 Body Mass Index (BMI) 40.0 and over, adult: Secondary | ICD-10-CM

## 2015-09-10 MED ORDER — GABAPENTIN 300 MG PO CAPS: 300.0000 mg | ORAL_CAPSULE | Freq: Two times a day (BID) | ORAL | Status: DC

## 2015-09-10 NOTE — Progress Notes (Signed)
Patient ID: Maria Nelson, female   DOB: [birth year], 60 y.o.   MRN: [id number]   Subjective:   Patient ID: Maria Nelson female   DOB: [birth year] 60 y.o.   MRN: [id number]  HPI: [name] is a 60 y.o. female with a PMH of COPD, OSA, Peptic ulcer disease here today with complaints of leg pain and rash.     Past Medical History  Diagnosis Date  . COPD (chronic obstructive pulmonary disease) ([place])     secondary to tobacco use // No PFTs on file  . Gastrointestinal hemorrhage     secondary to PUD on March 2008  . Peptic ulcer disease     +H. pylori (antigen)- Dr. Olevia Perches- treated  . ETOH abuse     Pt stopped in 3/08  . Tobacco abuse     quit in 2010  . OSA on CPAP     noncompliant with CPAP (2/2 it being depressing)  . Urinary incontinence   . Carpal tunnel syndrome 06/23/2011    Patient notes history of bilateral carpal tunnel syndrome.  Now has symptoms of right hand carpal tunnel.   . 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 STRESS INCONTINENCE 01/24/2007   Current Outpatient Prescriptions  Medication Sig Dispense Refill  . albuterol (PROVENTIL HFA;VENTOLIN HFA) 108 (90 BASE) MCG/ACT inhaler Inhale 2 puffs into the lungs every 6 (six) hours as needed for wheezing. 3.7 g 3  . albuterol (PROVENTIL) (2.5 MG/3ML) 0.083% nebulizer solution Use 1 vial in nebulizer every 1 hours as needed for wheezing 75 mL 3  . Azelastine HCl 0.15 % SOLN 1-2 sprays in each nostril 1-2 times daily as needed. 30 mL 3  . beclomethasone (QVAR) 80 MCG/ACT inhaler INHALE TWO PUFFS TWICE DAILY TO PREVENT COUGH OR WHEEZE. RINSE, GARGLE, AND SPIT AFTER USE. 1 Inhaler 4  . beclomethasone (QVAR) 80 MCG/ACT inhaler Inhale 1 puff into the lungs 2 (two) times daily. During Asthma/COPD flares. Rinse, gargle and spit with water after use. 1 Inhaler 3  . cetirizine (ZYRTEC) 10 MG tablet TAKE ONE TABLET BY MOUTH ONCE DAILY 30 tablet 3  . clotrimazole (MYCELEX) 10 MG troche Slowly dissolve in the mouth 5 times per day  for 10 days, not to be chewed or swallowed whole. 50 tablet 0  . desloratadine (CLARINEX) 5 MG tablet Take 5 mg by mouth daily.    [name] EPINEPHrine (EPIPEN 2-PAK) 0.3 mg/0.3 mL IJ SOAJ injection Inject into the muscle once.    . gabapentin (NEURONTIN) 300 MG capsule Take 1 capsule (300 mg total) by mouth 2 (two) times daily. 90 capsule 2  . hydrocortisone cream 0.5 % Apply 1 application topically 2 (two) times daily as needed for itching. 15 g 0  . ipratropium (ATROVENT) 0.02 % nebulizer solution INHALE ONE VIAL BY NEBULIZER EVERY 6 HOURS AS NEEDED 75 mL 1  . mometasone-formoterol (DULERA) 200-5 MCG/ACT AERO INHALE TWO PUFFS TWICE DAILY TO PREVENT COUGH OR WHEEZE. RINSE, GARGLE, AND SPIT AFTER USE. 1 Inhaler 3  . pantoprazole (PROTONIX) 40 MG tablet Take 1 tablet (40 mg total) by mouth daily. 30 tablet 11  . terbinafine (LAMISIL) 1 % cream Apply 1 application topically 2 (two) times daily. 30 g 0   No current facility-administered medications for this visit.   Family History  Problem Relation Age of Onset  . Diabetes Sister   . Breast cancer Sister 58    died 2/2 breast ca age 69-60  . Diabetes Mother   . Coronary artery disease Mother 60  requiring 5 vessel CABG   Social History   Social History  . Marital Status: Single    Spouse Name: N/A  . Number of Children: 3  . Years of Education: 12th grade   Occupational History  . Disability     previously worked in [employer] had to stop 2/2 occupational exposures leading to exacerbations of asthma    Social History Main Topics  . Smoking status: Former Smoker -- 0.40 packs/day    Types: Cigarettes    Quit date: 12/04/2010  . Smokeless tobacco: None     Comment: stopped 3 weeks ago  . Alcohol Use: 0.0 oz/week    0 Standard drinks or equivalent per week     Comment: now drinking a beer on weekends //   . Drug Use: No  . Sexual Activity: Not Asked   Other Topics Concern  . None   Social History Narrative   Pt is not working  now, was previously a Scientist, water quality at [employer].   Pt lives with her mother      Pt has received financial assistance approval for 100% discount at [facility] and has [facility] card.    Review of Systems: Review of Systems  Constitutional: Negative for fever and chills.  Respiratory: Negative for cough and shortness of breath.   Cardiovascular: Negative for chest pain and palpitations.  Gastrointestinal: Negative for nausea, vomiting, abdominal pain and diarrhea.  Genitourinary: Negative for dysuria.  Musculoskeletal: Negative for myalgias.  Skin: Positive for itching and rash.  Neurological: Negative for headaches.   Objective:  Physical Exam: Filed Vitals:   09/10/15 1421  BP: 118/70  Pulse: 65  Temp: 98.1 F (36.7 C)  TempSrc: Oral  Height: 5' (1.524 m)  Weight: 244 lb 9.6 oz (110.95 kg)  SpO2: 97%   Physical Exam  Constitutional: She is oriented to person, place, and time. Morbidly obese female. No distress.   Cardiovascular: Normal rate, regular rhythm and normal heart sounds. Exam reveals no gallop and no friction rub.  No murmur heard. Pulmonary/Chest: CTAB Abdominal: Soft. Bowel sounds are normal. She exhibits no distension and no mass. There is no tenderness. There is no rebound and no guarding.  Musculoskeletal: Normal range of motion. She exhibits no edema or tenderness.  Neurological: Decreased sensation in left lower leg from dorsum of foot to the knee. Normal strength throughout.  Skin: Bright, erythematous rash on right deltoid. No vesicles or lesions present. No urticaria.  Psychiatric: She has a normal mood and affect. Her behavior is normal.  Vitals reviewed.  Assessment & Plan:   Case discussed with Dr. Eppie Gibson. Please refer to Problem based carting for further details of today's visit.

## 2015-09-10 NOTE — Patient Instructions (Signed)
I am sending you in a prescription for a medication called Gabapentin. It can help with nerve pain. I think the pain in your leg is all coming from the disc problems you have in your back. The biggest thing you can do to help the pain is to lose weight. We will try to get you set up with our nutritionist, Butch Penny Plyler to discuss healthy foods.   I would like to see you back in 2-3 months for follow up.  Lumbosacral Radiculopathy Lumbosacral radiculopathy is a condition that involves the spinal nerves and nerve roots in the low back and bottom of the spine. The condition develops when these nerves and nerve roots move out of place or become inflamed and cause symptoms. CAUSES This condition may be caused by:  Pressure from a disk that bulges out of place (herniated disk). A disk is a plate of cartilage that separates bones in the spine.  Disk degeneration.  A narrowing of the bones of the lower back (spinal stenosis).  A tumor.  An infection.  An injury that places sudden pressure on the disks that cushion the bones of your lower spine. RISK FACTORS This condition is more likely to develop in:  Males aged 60-50 years.  Females aged 59-60 years.  People who lift improperly.  People who are overweight or live a sedentary lifestyle.  People who smoke.  People who perform repetitive activities that strain the spine. SYMPTOMS Symptoms of this condition include:  Pain that goes down from the back into the legs (sciatica). This is the most common symptom. The pain may be worse with sitting, coughing, or sneezing.  Pain and numbness in the arms and legs.  Muscle weakness.  Tingling.  Loss of bladder control or bowel control. DIAGNOSIS This condition is diagnosed with a physical exam and medical history. If the pain is lasting, you may have tests, such as:  MRI scan.  X-ray.  CT scan.  Myelogram.  Nerve conduction study. TREATMENT This condition is often treated  with:  Hot packs and ice applied to affected areas.  Stretches to improve flexibility.  Exercises to strengthen back muscles.  Physical therapy.  Pain medicine.  A steroid injection in the spine. In some cases, no treatment is needed. If the condition is long-lasting (chronic), or if symptoms are severe, treatment may involve surgery or lifestyle changes, such as following a weight loss plan. HOME CARE INSTRUCTIONS Medicines  Take medicines only as directed by your health care provider.  Do not drive or operate heavy machinery while taking pain medicine. Injury Care  Apply a heat pack to the injured area as directed by your health care provider.  Apply ice to the affected area:  Put ice in a plastic bag.  Place a towel between your skin and the bag.  Leave the ice on for 20-30 minutes, every 2 hours while you are awake or as needed. Or, leave the ice on for as long as directed by your health care provider. Other Instructions  If you were shown how to do any exercises or stretches, do them as directed by your health care provider.  If your health care provider prescribed a diet or exercise program, follow it as directed.  Keep all follow-up visits as directed by your health care provider. This is important. SEEK MEDICAL CARE IF:  Your pain does not improve over time even when taking pain medicines. SEEK IMMEDIATE MEDICAL CARE IF:  Your develop severe pain.  Your pain suddenly  gets worse.  You develop increasing weakness in your legs.  You lose the ability to control your bladder or bowel.  You have difficulty walking or balancing.  You have a fever.   This information is not intended to replace advice given to you by your health care provider. Make sure you discuss any questions you have with your health care provider.   Document Released: 04/05/2005 Document Revised: 08/20/2014 Document Reviewed: 04/01/2014 Elsevier Interactive Patient Education [employer]

## 2015-09-14 NOTE — Assessment & Plan Note (Addendum)
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 Complaining of left leg shooting burning pain. Reports starts in left foot and shoots up her leg to lower back. No alarm symptoms.  On exam she has decreased sensation in the dorsum of her left foot and shin to her knee.  Symptoms consistent with radicular pain most likely of the L5 region.  Patient reports no interest in surgery. Of note, she has consistently gained weight over the past several years, 205 lb to 245lb today since 2015.  Plan Gabapentin 300 mg tid Encourage weight loss

## 2015-09-14 NOTE — Assessment & Plan Note (Addendum)
Patient has consistently gained weight since 2015, 205 lbs to 245 lbs today. BMI today is 48.  Discussed diet and weight loss today.   Plan  Patient agreeable to meeting with nutritionist for further counciling

## 2015-09-14 NOTE — Assessment & Plan Note (Signed)
Patient with new eruption of erythematous macular rash on right upper arm with associated itching. No urticaria present. Appears similar in apperance to rash patient had in 11/2014. Reports she has seen an allergist and told she was allergic to the 'outdoors.' No recent changes in medications, detergents or soaps. No outdoor activity. Does not appear to be a contact dermatitis.  Unclear etiology.  Plan Hydrocortisone cream prn Follow up in clinic if worsening, may need derm referral if persists

## 2015-09-17 NOTE — Addendum Note (Signed)
Addended by: Oval Linsey D on: 09/17/2015 08:29 AM   Modules accepted: Level of Service

## 2015-09-17 NOTE — Progress Notes (Signed)
I saw and evaluated the patient. I personally confirmed the key portions of Dr. Boswell's history and exam and reviewed pertinent patient test results. The assessment, diagnosis, and plan were formulated together and I agree with the documentation in the resident's note. 

## 2015-09-30 ENCOUNTER — Encounter: Payer: Self-pay | Admitting: *Deleted

## 2015-09-30 ENCOUNTER — Ambulatory Visit: Payer: Medicaid Other | Admitting: Dietician

## 2015-09-30 ENCOUNTER — Encounter: Payer: Self-pay | Admitting: Dietician

## 2015-09-30 VITALS — Ht 60.0 in | Wt 243.5 lb

## 2015-09-30 DIAGNOSIS — Z6841 Body Mass Index (BMI) 40.0 and over, adult: Principal | ICD-10-CM

## 2015-09-30 NOTE — Patient Instructions (Addendum)
Calorie Counting for Weight Loss Calories are energy you get from the things you eat and drink. Your body uses this energy to keep you going throughout the day. The number of calories you eat affects your weight. When you eat more calories than your body needs, your body stores the extra calories as fat. When you eat fewer calories than your body needs, your body burns fat to get the energy it needs. Calorie counting means keeping track of how many calories you eat and drink each day. If you make sure to eat fewer calories than your body needs, you should lose weight. In order for calorie counting to work, you will need to eat the number of calories that are right for you in a day to lose a healthy amount of weight per week. A healthy amount of weight to lose per week is usually 1-2 lb (0.5-0.9 kg). A dietitian can determine how many calories you need in a day and give you suggestions on how to reach your calorie goal.  WHAT IS MY MY PLAN? My goal is to have ___1100-1300_______ calories per day.  If I have this many calories per day, I should lose around __1-2_____ pounds per week.  WHERE DO I FIND CALORIE INFORMATION? The number of calories in a food can be found on a Nutrition Facts label. Note that all the information on a label is based on a specific serving of the food. If a food does not have a Nutrition Facts label, try to look up the calories online or ask your dietitian for help. HOW DO I DECIDE HOW MUCH TO EAT? To decide how much of the food you can eat, you will need to consider both the number of calories in one serving and the size of one serving. This information can be found on the Nutrition Facts label. If a food does not have a Nutrition Facts label, look up the information online or ask your dietitian for help. Remember that calories are listed per serving. If you choose to have more than one serving of a food, you will have to multiply the calories per serving by the amount of servings  you plan to eat. For example, the label on a package of bread might say that a serving size is 1 slice and that there are 90 calories in a serving. If you eat 1 slice, you will have eaten 90 calories. If you eat 2 slices, you will have eaten 180 calories.  WHAT ARE SOME PORTION CONTROL TIPS?  Know how many calories are in a serving. This will help you know how many servings of a certain food you can have.  Use a measuring cup to measure serving sizes. This is helpful when you start out. With time, you will be able to estimate serving sizes for some foods.  Take some time to put servings of different foods on your favorite plates, bowls, and cups so you know what a serving looks like.  Try not to eat straight from a bag or box. Doing this can lead to overeating. Put the amount you would like to eat in a cup or on a plate to make sure you are eating the right portion.  Use smaller plates, glasses, and bowls to prevent overeating. This is a quick and easy way to practice portion control. If your plate is smaller, less food can fit on it. Try not to multitask while eating, such as watching TV or using your computer. If it is  time to eat, sit down at a table and enjoy your food. Doing this will help you to start recognizing when you are full. It will also make you more aware of what and how much you are eating.  Breakfast:   1 starch  1 protein  1 dairy   1 fruit Lunch:  3 oz protein  1 starch  1 cup vegetables  1 fat   1 fruit Dinner: 3 oz protein  1 starch  1 cup vegetables  1 fat   1 fruit  Snack: 1 diary  Can have 3 servings of "free foods" per day. A Free food is one that contains less than 20 calories  .

## 2015-09-30 NOTE — Telephone Encounter (Signed)
Phone note opened in error when trying to follow up referral. No phone call made at this time. Yvonna Alanis, RN, 09/30/15- 11:48A

## 2015-09-30 NOTE — Progress Notes (Signed)
  Medical Nutrition Therapy:  Appt start time: [id number] end time:  1605. Visit # 1  Assessment:  Primary concerns today: weight loss.  Ms. Band is verbalizing a desire to change " I am too short for all this weight and it doesn't feel good" . She is well versed in cooking and has recently been adopting smaller portions and lower calorie snacks. She likes beans, fruits and vegetables, baked meats like chicken and fish and uses salt free seasoning for the most part in cooking. She feels calorie counting is too much right now.  Preferred Learning Style: No preference indicated  Learning Readiness: Ready  ANTHROPOMETRICS: weight-243.5#, height-60"  BMI-47.6- class II obesity WEIGHT HISTORY:not gone over today SLEEP:has not worn cpap for a while, no headaches MEDICATIONS: says she gains weight every time she is put on steroids for her breathing which is often last time in April DIETARY INTAKE: Usual eating pattern includes 3 meals and 3 snacks per day. Everyday foods include baked foods, vegetables and fruits.  Avoided foods include stopped chocolate due to gout, sweet tea   24-hr recall:  B ( AM): pinapple L ( PM): cabbage, pintos Snk ( PM): activia yogurt D ( PM): baked meat- chicken, vegetables, bread,  Snk ( PM): ice cream, popsicles Beverages: mostly water, 1-2 beer per month  Usual physical activity: walks daily a few blocks  Daily Estimated energy needs: [phone] calories for 1-2 # weight loss per week  Progress Towards Goal(s):  In progress.   Nutritional Diagnosis:  NB-1.1 Food and nutrition-related knowledge deficit As related to lack of sufficient nutrition training.  As evidenced by her report. .    Intervention:  Nutrition education about weight loss, calories, importance of quality and adequate sleep and how to follow a meal plan. Coordination of care: none needed at his time  Teaching Method Utilized:Visual, Auditory, Hands on Handouts given during visit  include: Barriers to learning/adherence to lifestyle change: transportation. Material resources Demonstrated degree of understanding via:  Teach Back   Monitoring/Evaluation:  Dietary intake, exercise, and body weight in 1 week(s).

## 2015-10-09 ENCOUNTER — Ambulatory Visit (INDEPENDENT_AMBULATORY_CARE_PROVIDER_SITE_OTHER): Payer: Medicaid Other | Admitting: Dietician

## 2015-10-09 VITALS — Ht 60.0 in | Wt 245.3 lb

## 2015-10-09 DIAGNOSIS — Z6841 Body Mass Index (BMI) 40.0 and over, adult: Principal | ICD-10-CM

## 2015-10-09 NOTE — Progress Notes (Signed)
  Medical Nutrition Therapy:  Appt start time: 0900 end time:  0926. Visit # 2  Assessment:  Primary concerns today: weight loss.  Ms. Hainline is verbalizing a desire to change " I have a lot of support helping me with meal planning and I am hungry all the time, but I am not giving up!" . She reports still walking a few blocks each day Preferred Learning Style: No preference indicated  Learning Readiness: Ready  ANTHROPOMETRICS: weight-245.3#, height-60"  BMI-47.6- class III obesity WEIGHT HISTORY:not discussed today SLEEP: not using cpap  DIETARY INTAKE: Usual eating pattern includes 3 meals and 3 snacks per day. Everyday foods include baked foods, vegetables and fruits.  Avoided foods include stopped chocolate due to gout, sweet tea   24-hr recall:  B ( AM): 1 slice toast, chex cereal, almond milk, yogurt L ( PM): cabbage, pintos Snk ( PM): activia yogurt D ( PM): baked meat- chicken, vegetables, bread,  Snk ( PM): ice cream, popsicles Beverages: mostly water, 1-2 beer per month  Usual physical activity: walks daily a few blocks  Daily Estimated energy needs: [phone] calories for 1-2 # weight loss per week  Progress Towards Goal(s):  In progress.   Nutritional Diagnosis:  NB-1.1 Food and nutrition-related knowledge deficit As related to lack of sufficient nutrition training.  As evidenced by her report. .    Intervention:  Nutrition education about weight loss, calories, importance of quality and adequate sleep and how to follow a meal plan. Coordination of care: none needed at his time  Teaching Method Utilized:Visual, Auditory, Hands on Handouts given during visit include: Barriers to learning/adherence to lifestyle change: transportation. Material resources Demonstrated degree of understanding via:  Teach Back   Monitoring/Evaluation:  Dietary intake, exercise, and body weight in 2 week(s).

## 2015-10-09 NOTE — Patient Instructions (Addendum)
Top Ways to Deal With Hunger Tricks to turn down your appetite  1. Bulk up your meals. There's a lot of evidence that bulk -- that is, fiber -- reduces appetite. So turn up the volume with higher-fiber foods like fruits, vegetables, whole grains, and beans. These foods also tend to have a high water content, which helps you feel full.  2. Cool off your appetite with soup. Have a bowl of broth or vegetable-based soup (hot or cold) for a first course, and you'll probably end up eating fewer total calories at that meal. Creamy or high-fat soups need not apply for this job -- stick to the low-cal, high-fiber choices like minestrone or vegetable-bean type soups.  3. Crunch your appetite away with a big salad. One study found that when people had a large (3 cups), low-calorie (100 calories) salad before lunch, they ate 12% fewer calories during the meal. When they had a smaller salad (1 1/2 cups and 50 calories), they ate 7% fewer calories overall. You can make the same salads used in the study: Toss romaine lettuce, carrots, tomatoes, celery, and cucumbers together, and top with fat-free or low-fat dressing. But beware the fatty salad! Eating a high-calorie salad, even a small one, can encourage [place] to eat more calories at the meal than if we ate no salad at all.  4. Stay on course. A little bit of variety in our meals is good and even healthful. But having several courses during a meal can lead you down the wrong path. Adding an extra course to your meal (unless it's a low-calorie salad or broth-type soup) usually increases the total calories you consume for that meal.  5. An orange or grapefruit a day helps keep appetite away. Research suggests that low-calorie plant foods that are rich in soluble fiber -- like oranges and grapefruit -- help [place] feel fuller faster and keep blood sugars steady. This can translate into better appetite control. Of the 20 most popular fruits and vegetables, oranges and grapefruits  are highest in fiber!  6. Get milk (or other low-fat dairy foods). Increasing your intake of low-fat dairy foods is a great way to get more of two proteins that are thought to be appetite suppressors -- whey and casein. And drinking milk may be especially effective. A recent study found that whey -- the liquid part of milk -- was better at reducing appetite than casein.   7. Have some fat with your carbs -- but not too much! When we eat fat, a hormone called leptin is released from our fat cells. This is a good thing when we're talking about moderate amounts of fat. Studies have shown that a lack of leptin (due to a very low-fat diet) can trigger a voracious appetite. Obviously, we want to do the opposite of that. But that doesn't mean we should opt for a high-fat meal. Research has found a higher frequency of obesity among people who eat a high-fat diet than among those who eat a low-fat diet.  8. Enjoy some soy. Soybeans offer protein and fat along with carbohydrates. That alone would suggest that soybeans are more satisfying and more likely to keep our appetites in control than most plant foods. But a recent study in rats suggests that a particular component in soybeans has definite appetite-suppressing qualities.  9. Slow down, you're eating too fast. It takes at least 20 minutes for your brain to get the message that your stomach is officially "comfortable" and that you should  stop eating. If you eat slowly, the brain has a chance to catch up with the stomach, and you're less likely to overeat.  10. Take a walk after meals is you are still hungry

## 2015-10-28 ENCOUNTER — Encounter: Payer: Self-pay | Admitting: Dietician

## 2015-10-28 ENCOUNTER — Ambulatory Visit (INDEPENDENT_AMBULATORY_CARE_PROVIDER_SITE_OTHER): Payer: Medicaid Other | Admitting: Allergy and Immunology

## 2015-10-28 ENCOUNTER — Ambulatory Visit (INDEPENDENT_AMBULATORY_CARE_PROVIDER_SITE_OTHER): Payer: Medicaid Other | Admitting: Dietician

## 2015-10-28 ENCOUNTER — Encounter: Payer: Self-pay | Admitting: Allergy and Immunology

## 2015-10-28 VITALS — Wt 242.1 lb

## 2015-10-28 VITALS — BP 122/78 | HR 76 | Resp 20

## 2015-10-28 DIAGNOSIS — L500 Allergic urticaria: Secondary | ICD-10-CM | POA: Diagnosis not present

## 2015-10-28 DIAGNOSIS — L501 Idiopathic urticaria: Secondary | ICD-10-CM | POA: Diagnosis not present

## 2015-10-28 DIAGNOSIS — T783XXD Angioneurotic edema, subsequent encounter: Secondary | ICD-10-CM | POA: Diagnosis not present

## 2015-10-28 DIAGNOSIS — J45909 Unspecified asthma, uncomplicated: Secondary | ICD-10-CM

## 2015-10-28 DIAGNOSIS — J449 Chronic obstructive pulmonary disease, unspecified: Secondary | ICD-10-CM

## 2015-10-28 DIAGNOSIS — J3089 Other allergic rhinitis: Secondary | ICD-10-CM | POA: Diagnosis not present

## 2015-10-28 DIAGNOSIS — Z6841 Body Mass Index (BMI) 40.0 and over, adult: Principal | ICD-10-CM

## 2015-10-28 NOTE — Assessment & Plan Note (Signed)
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  Blood work was unremarkable in 2015. We will evaluate further at this time. The following labs have been ordered: FCeRI antibody, TSH, anti-thyroglobulin antibody, thyroid peroxidase antibody, tryptase, C4, and galactose-alpha-1,3-galactose IgE level. The patient will be called with further recommendations after lab results have returned. Now, continue antihistamines as needed and have access to epinephrine autoinjector 2 pack.

## 2015-10-28 NOTE — Progress Notes (Signed)
  Medical Nutrition Therapy:  Appt start time: [id number] end time:  [id number] Visit # 3  Assessment:  Primary concerns today: weight loss and healthier lifestyle Ms. Vasser is happy with her 3# weight loss, her changes she has sustained in her activity and her eating habits- less fried foods, more baked, more fruits and vegetables, walking every evening 2 block and not eating ice cream. All of these have resulted in her 3 pound weight loss over the past 3 weeks despite her family reunion last week.  Reports benefits of walking daily are not using inhaler, feels better, weight loss.  Lists her sons who facebook her recipes and encourage her to walk and her niece and grandchildren as support. Her grandchildren remind her to go for her walks and go with her.   ANTHROPOMETRICS: weight-242.1#, height-60"  BMI-47.6- class III obesity WEIGHT HISTORY:not discussed today SLEEP: not using cpap and no desire to  DIETARY INTAKE: .Eats mostly at home.  24-hr recall:  B ( AM):  cheerios cereal, almond milk, water L ( PM): brocoli and cheese, macaroni and cheese, baked fish or chicken Snk ( PM): coffee D ( PM): tuna fish salad and whole wheat bread Snk ( PM): sugar free popsicles or pineapple Beverages: mostly water  Daily Estimated energy needs: [phone] calories for 1-2 # weight loss per week  Progress Towards Goal(s):  In progress.   Nutritional Diagnosis:  NB-1.1 Food and nutrition-related knowledge deficit As related to lack of sufficient nutrition training.  As evidenced by her report. .    Intervention:  Nutrition education and support about focusing on behavior change more than weight loss, calories. Encouraged adequate support and behaviors that encourage support- telling family her goals. Coordination of care: none needed at his time  Teaching Method Utilized:Visual, Auditory, Hands on Handouts given during visit include: Barriers to learning/adherence to lifestyle change: transportation.  Material resources Demonstrated degree of understanding via:  Teach Back   Monitoring/Evaluation:  Dietary intake, exercise, and body weight in 6 week(s).

## 2015-10-28 NOTE — Patient Instructions (Addendum)
Angioedema/urticaria Adallyn has had recurrent angioedema, occasionally with associated urticaria.  Blood work was unremarkable in 2015. We will evaluate further at this time.  The following labs have been ordered: FCeRI antibody, TSH, anti-thyroglobulin antibody, thyroid peroxidase antibody, tryptase, C4, and galactose-alpha-1,3-galactose IgE level.  The patient will be called with further recommendations after lab results have returned.  Now, continue antihistamines as needed and have access to epinephrine autoinjector 2 pack.  Asthma with COPD  Continue Dulera 200/5 g, 2 inhalations via spacer device twice a day, and albuterol/ipratropium every 4-6 hours as needed.  During respiratory tract infections and asthma/COPD flares, add Qvar 80 g, one inhalation via spacer device twice a day, until symptoms have returned to baseline.   Subjective and objective measures of pulmonary function will be followed and the treatment plan will be adjusted accordingly.  Allergic rhinitis  Continue appropriate allergen avoidance measures, cetirizine as needed, azelastine nasal spray as needed, and nasal saline irrigation as needed.    Return in about 4 months (around 02/28/2016), or if symptoms worsen or fail to improve.

## 2015-10-28 NOTE — Assessment & Plan Note (Addendum)
Clairese has had recurrent angioedema, occasionally with associated urticaria.  Blood work was unremarkable in 2015. We will evaluate further at this time.  The following labs have been ordered: FCeRI antibody, TSH, anti-thyroglobulin antibody, thyroid peroxidase antibody, tryptase, C4, and galactose-alpha-1,3-galactose IgE level.  The patient will be called with further recommendations after lab results have returned.  Now, continue antihistamines as needed and have access to epinephrine autoinjector 2 pack.

## 2015-10-28 NOTE — Progress Notes (Signed)
Follow-up Note  RE: Maria Nelson MRN: [id number] DOB: 12/07/1955 Date of Office Visit: 10/28/2015  Primary care provider: Maryellen Pile, MD Referring provider: Maryellen Pile, MD  History of present illness: HPI Comments: Maria Nelson is a 60 y.o. female asthma/COPD, allergic rhinitis, and history of urticaria/angioedema presents today for follow up.  She was last seen in this clinic on 07/01/2015.  She reports that her asthma/COPD has been relatively well-controlled as long she can avoid going out in the heat in humidity.  She has no nasal symptom complaints today.  She reports that last night she ate a pork chop and approximately 2 or 3 hours later her mouth began to tingle and her tongue swelled.  She did not experience concomitant cardiopulmonary or other GI symptoms.  She is uncertain if she has ever been bitten by a tick.  Prior to this episode, the last time she had experienced angioedema plus/minus urticaria was in January or February does not recall if she had not consumed beef, pork, or lamb prior to the onset of symptoms.   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  Blood work was unremarkable in 2015. We will evaluate further at this time.  The following labs have been ordered: FCeRI antibody, TSH, anti-thyroglobulin antibody, thyroid peroxidase antibody, tryptase, C4, and galactose-alpha-1,3-galactose IgE level.  The patient will be called with further recommendations after lab results have returned.  Now, continue antihistamines as needed and have access to epinephrine autoinjector 2 pack.  Asthma with COPD  Continue Dulera 200/5 g, 2 inhalations via spacer device twice a day, and albuterol/ipratropium every 4-6 hours as needed.  During respiratory tract infections and asthma/COPD flares, add Qvar 80 g, one inhalation via spacer device twice a day, until symptoms have  returned to baseline.   Subjective and objective measures of pulmonary function will be followed and the treatment plan will be adjusted accordingly.  Allergic rhinitis  Continue appropriate allergen avoidance measures, cetirizine as needed, azelastine nasal spray as needed, and nasal saline irrigation as needed.   Diagnositics: Spirometry reveals an FVC of 1.71 L and an FEV1 of 1.15 L with an FEV1 ratio of 86%.   Please see scanned spirometry results for details.    Physical examination: Blood pressure 122/78, pulse 76, resp. rate 20.  General: Alert, interactive, in no acute distress. HEENT: TMs pearly gray, turbinates mildly edematous without discharge, post-pharynx moderately erythematous. Neck: Supple without lymphadenopathy. Lungs: Mildly decreased breath sounds bilaterally without wheezing, rhonchi or rales. CV: Normal S1, S2 without murmurs. Skin: Warm and dry, without lesions or rashes.  The following portions of the patient's history were reviewed and updated as appropriate: allergies, current medications, past family history, past medical history, past social history, past surgical history and problem list.    Medication List       This list is accurate as of: 10/28/15  5:32 PM.  Always use your most recent med list.               albuterol 108 (90 Base) MCG/ACT inhaler  Commonly known as:  PROVENTIL HFA;VENTOLIN HFA  Inhale 2 puffs into the lungs every 6 (six) hours as needed for wheezing.     albuterol (2.5 MG/3ML) 0.083% nebulizer solution  Commonly known as:  PROVENTIL  Use 1 vial in nebulizer every 1 hours as needed for wheezing     Azelastine HCl 0.15 % Soln  1-2 sprays in each nostril 1-2 times daily as  needed.     beclomethasone 80 MCG/ACT inhaler  Commonly known as:  QVAR  Inhale 1 puff into the lungs 2 (two) times daily. During Asthma/COPD flares. Rinse, gargle and spit with water after use.     cetirizine 10 MG tablet  Commonly known as:  ZYRTEC   TAKE ONE TABLET BY MOUTH ONCE DAILY     clotrimazole 10 MG troche  Commonly known as:  MYCELEX  Slowly dissolve in the mouth 5 times per day for 10 days, not to be chewed or swallowed whole.     desloratadine 5 MG tablet  Commonly known as:  CLARINEX  Take 5 mg by mouth daily.     EPIPEN 2-PAK 0.3 mg/0.3 mL Soaj injection  Generic drug:  EPINEPHrine  Inject into the muscle once.     gabapentin 300 MG capsule  Commonly known as:  NEURONTIN  Take 1 capsule (300 mg total) by mouth 2 (two) times daily.     hydrocortisone cream 0.5 %  Apply 1 application topically 2 (two) times daily as needed for itching.     ipratropium 0.02 % nebulizer solution  Commonly known as:  ATROVENT  INHALE ONE VIAL BY NEBULIZER EVERY 6 HOURS AS NEEDED     mometasone-formoterol 200-5 MCG/ACT Aero  Commonly known as:  DULERA  INHALE TWO PUFFS TWICE DAILY TO PREVENT COUGH OR WHEEZE. RINSE, GARGLE, AND SPIT AFTER USE.     pantoprazole 40 MG tablet  Commonly known as:  PROTONIX  Take 1 tablet (40 mg total) by mouth daily.     terbinafine 1 % cream  Commonly known as:  LAMISIL  Apply 1 application topically 2 (two) times daily.        Allergies  Allergen Reactions  . Bee Venom Anaphylaxis  . Aspirin Nausea Only   Review of systems: Constitutional: Negative for fever, chills and weight loss.  HENT: Negative for nosebleeds.   Eyes: Negative for blurred vision.  Respiratory: Negative for hemoptysis.   Cardiovascular: Negative for chest pain.  Gastrointestinal: Negative for diarrhea and constipation.  Genitourinary: Negative for dysuria.  Musculoskeletal: Negative for myalgias and joint pain.  Neurological: Negative for dizziness.  Endo/Heme/Allergies: Does not bruise/bleed easily.  Cutaneous: Positive for angioedema.  Past Medical History  Diagnosis Date  . COPD (chronic obstructive pulmonary disease) ([place])     secondary to tobacco use // No PFTs on file  . Gastrointestinal hemorrhage      secondary to PUD on March 2008  . Peptic ulcer disease     +H. pylori (antigen)- Dr. Olevia Perches- treated  . ETOH abuse     Pt stopped in 3/08  . Tobacco abuse     quit in 2010  . OSA on CPAP     noncompliant with CPAP (2/2 it being depressing)  . Urinary incontinence   . Carpal tunnel syndrome 06/23/2011    Patient notes history of bilateral carpal tunnel syndrome.  Now has symptoms of right hand carpal tunnel.   . 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 STRESS INCONTINENCE 01/24/2007    Family History  Problem Relation Age of Onset  . Diabetes Sister   . Breast cancer Sister 75    died 2/2 breast ca age 33-60  . Diabetes Mother   . Coronary artery disease Mother 51    requiring 5 vessel CABG    Social History   Social History  . Marital Status: Single    Spouse Name: N/A  . Number of Children: 3  . Years  of Education: 12th grade   Occupational History  . Disability     previously worked in [employer] had to stop 2/2 occupational exposures leading to exacerbations of asthma    Social History Main Topics  . Smoking status: Former Smoker -- 0.40 packs/day    Types: Cigarettes    Quit date: 12/04/2010  . Smokeless tobacco: Not on file     Comment: stopped 3 weeks ago  . Alcohol Use: 0.0 oz/week    0 Standard drinks or equivalent per week     Comment: now drinking a beer on weekends //   . Drug Use: No  . Sexual Activity: Not on file   Other Topics Concern  . Not on file   Social History Narrative   Pt is not working now, was previously a Scientist, water quality at [employer].   Pt lives with her mother      Pt has received financial assistance approval for 100% discount at [facility] and has [facility] card.      I appreciate the opportunity to take part in Brenisha's care. Please do not hesitate to contact me with questions.  Sincerely,   R. Edgar Frisk, MD

## 2015-10-28 NOTE — Assessment & Plan Note (Signed)
   Continue Dulera 200/5 g, 2 inhalations via spacer device twice a day, and albuterol/ipratropium every 4-6 hours as needed.  During respiratory tract infections and asthma/COPD flares, add Qvar 80 g, one inhalation via spacer device twice a day, until symptoms have returned to baseline.   Subjective and objective measures of pulmonary function will be followed and the treatment plan will be adjusted accordingly.

## 2015-10-28 NOTE — Assessment & Plan Note (Signed)
   Continue appropriate allergen avoidance measures, cetirizine as needed, azelastine nasal spray as needed, and nasal saline irrigation as needed.

## 2015-10-28 NOTE — Patient Instructions (Addendum)
Congrats on walking daily and making healthier food choices!! Way to go!!!   Your weight was 242 today# 3 pounds less.   Butch Penny [phone]

## 2015-10-29 ENCOUNTER — Ambulatory Visit: Payer: Medicaid Other | Admitting: Allergy and Immunology

## 2015-10-29 LAB — C4 COMPLEMENT: C4 Complement: 39 mg/dL (ref 16–47)

## 2015-10-30 LAB — TRYPTASE: Tryptase: 7.6 ug/L (ref <11)

## 2015-10-31 LAB — ALPHA-GAL PANEL
Allergen, Mutton, f88: <0.1 kU/L
Allergen, Pork, f26: <0.1 kU/L
Beef: 0.13 kU/L — ABNORMAL HIGH
Galactose-alpha-1,3-galactose IgE: <0.1 kU/L (ref <0.35)

## 2015-11-05 LAB — CP CHRONIC URTICARIA INDEX PANEL
Histamine Release: <16 % (ref <16)
TSH: 1.86 mIU/L
Thyroglobulin Ab: <1 IU/mL (ref <2)
Thyroperoxidase Ab SerPl-aCnc: 1 IU/mL (ref <9)

## 2015-11-07 ENCOUNTER — Telehealth: Payer: Self-pay

## 2015-11-07 NOTE — Telephone Encounter (Signed)
Spoke with pt about lab results and sent them to pcp.

## 2015-11-26 ENCOUNTER — Encounter (INDEPENDENT_AMBULATORY_CARE_PROVIDER_SITE_OTHER): Payer: Self-pay

## 2015-11-26 ENCOUNTER — Ambulatory Visit (INDEPENDENT_AMBULATORY_CARE_PROVIDER_SITE_OTHER): Payer: Medicaid Other | Admitting: Internal Medicine

## 2015-11-26 DIAGNOSIS — R21 Rash and other nonspecific skin eruption: Secondary | ICD-10-CM | POA: Diagnosis not present

## 2015-11-26 DIAGNOSIS — Z8742 Personal history of other diseases of the female genital tract: Secondary | ICD-10-CM

## 2015-11-26 DIAGNOSIS — R1032 Left lower quadrant pain: Secondary | ICD-10-CM

## 2015-11-26 DIAGNOSIS — M5126 Other intervertebral disc displacement, lumbar region: Secondary | ICD-10-CM

## 2015-11-26 DIAGNOSIS — J449 Chronic obstructive pulmonary disease, unspecified: Secondary | ICD-10-CM

## 2015-11-26 DIAGNOSIS — Z8719 Personal history of other diseases of the digestive system: Secondary | ICD-10-CM | POA: Diagnosis not present

## 2015-11-26 DIAGNOSIS — J439 Emphysema, unspecified: Secondary | ICD-10-CM

## 2015-11-26 NOTE — Patient Instructions (Signed)
Thank you for coming in today  I am going to increase the dose of your hydrocortisone cream today.  I am going to get you set up for an ultrasound of your belly as well.  Please come back in 1 month for follow up.

## 2015-12-01 MED ORDER — HYDROCORTISONE 1 % EX CREA: TOPICAL_CREAM | CUTANEOUS | 1 refills | Status: AC

## 2015-12-01 MED ORDER — PANTOPRAZOLE SODIUM 40 MG PO TBEC: 40.0000 mg | DELAYED_RELEASE_TABLET | Freq: Every day | ORAL | 11 refills | Status: DC

## 2015-12-01 NOTE — Assessment & Plan Note (Signed)
Back pain improved somewhat with gabapentin. Denies any alarm symptoms. Down from 245 lbs at last visit to 237 lbs today.  Plan Continue gabapentin Continue to encourage weight loss

## 2015-12-01 NOTE — Assessment & Plan Note (Signed)
Patient with complaints of LLQ pain. Reports has been present for the past month. Unable to characterize the pain and can only state it is painful. Nothing makes it better, nothing makes it worse. Pain does not radiate anywhere. Patient is able to pinpoint pain to one location in LLQ. Denies any nausea, vomiting, diarrhea. Reports hard stools but does not strain. Reports daily bowel movements. No melena or hematochezia. Denies any urinary symptoms. Denies any vaginal bleeding. No fevers or chills.   Reports a history of ovarian cyst and diverticulosis.   On exam, she is extremely tender to palpation in LLQ. Difficult to assess for any masses as patient jumps from the pain and obese abdomen. No rebound tenderness.   Plan  Will get abdominal and transvaginal ultrasound. Differential includes ovarian cyst, diverticulosis and hernia.   RTC in 1 month after ultrasound. If ultrasound unrevealing, will move to abdominal CT for further investigation.

## 2015-12-01 NOTE — Assessment & Plan Note (Signed)
Doing well on Dulera bid and albuterol prn. Reports needing albuterol 3x a week. Denies any shortness of breath, cough or wheezing.  -Continue current medications

## 2015-12-01 NOTE — Progress Notes (Signed)
Internal Medicine Clinic Attending  Case discussed with Dr. Boswell at the time of the visit.  We reviewed the resident's history and exam and pertinent patient test results.  I agree with the assessment, diagnosis, and plan of care documented in the resident's note.  

## 2015-12-01 NOTE — Progress Notes (Signed)
   CC: abdominal pain  HPI:  [name] is a 60 y.o. female with a past medical history listed below here today with complaints of LLQ pain and rash.  For details of today's visit and the status of her chronic medical issues please refer to the assessment and plan.   Past Medical History:  Diagnosis Date  . 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 Carpal tunnel syndrome 06/23/2011   Patient notes history of bilateral carpal tunnel syndrome.  Now has symptoms of right hand carpal tunnel.   [name] COPD (chronic obstructive pulmonary disease) ([place])    secondary to tobacco use // No PFTs on file  . ETOH abuse    Pt stopped in 3/08  . Gastrointestinal hemorrhage    secondary to PUD on March 2008  . OSA on CPAP    noncompliant with CPAP (2/2 it being depressing)  . Peptic ulcer disease    +H. pylori (antigen)- Dr. Olevia Perches- treated  . STRESS INCONTINENCE 01/24/2007  . Tobacco abuse    quit in 2010  . Urinary incontinence     Review of Systems: Review of Systems  Constitutional: Negative for chills and fever.  Respiratory: Negative for cough, sputum production, shortness of breath and wheezing.   Gastrointestinal: Positive for abdominal pain. Negative for blood in stool, constipation, diarrhea, melena, nausea and vomiting.  Genitourinary: Negative for dysuria and hematuria.  Skin: Positive for itching and rash.   Physical Exam:  Vitals:   11/26/15 1520  BP: 127/81  Pulse: (!) 56  Temp: 98.6 F (37 C)  TempSrc: Oral  SpO2: 99%  Weight: 237 lb 8 oz (107.7 kg)  Height: 5' (1.524 m)   Constitutional: She is oriented to person, place, and time. Morbidly obese female. No distress.   Cardiovascular: Normal rate, regular rhythm and normal heart sounds. Exam reveals no gallop and no friction rub.  No murmur heard. Pulmonary/Chest: CTAB Abdominal: Soft. Bowel sounds are normal. Tenderness to palpation over LLQ. Difficult to assess for mass due to pain and obese abdomen. There is no  rebound and no guarding.  Musculoskeletal: Normal range of motion. She exhibits no edema or tenderness.  Neurological: Decreased sensation in left lower leg from dorsum of foot to the knee. Normal strength throughout.  Skin: Bright, erythematous rash across upper back. No vesicles or lesions present. No urticaria.  Psychiatric: She has a normal mood and affect. Her behavior is normal.  Vitals reviewed.  Assessment & Plan:   See Encounters Tab for problem based charting.  Patient discussed with Dr. Daryll Drown

## 2015-12-01 NOTE — Assessment & Plan Note (Signed)
>>  ASSESSMENT AND PLAN FOR COPD (CHRONIC OBSTRUCTIVE PULMONARY DISEASE) ([place]) WRITTEN ON 12/01/2015 11:00 AM BY Maryellen Pile, MD  Doing well on Dulera bid and albuterol prn. Reports needing albuterol 3x a week. Denies any shortness of breath, cough or wheezing.  -Continue current medications

## 2015-12-01 NOTE — Assessment & Plan Note (Signed)
Patient reports rash has not resolved. Continues to migrate around her body. Hydrocortisone cream does offer some improvement and helps with the itching. Has rash present again today across her mid-back. Areas of trauma from scratching.   Plan Increase hydrocortisone cream dose from 0.5% to 1% RTC in 1 month. If no improvement will proceed with punch biopsy.

## 2015-12-03 ENCOUNTER — Other Ambulatory Visit: Payer: Self-pay | Admitting: Internal Medicine

## 2015-12-15 NOTE — Addendum Note (Signed)
Addended by: Marcelino Duster on: 12/15/2015 02:14 PM   Modules accepted: Orders

## 2015-12-16 ENCOUNTER — Ambulatory Visit: Payer: Medicaid Other | Admitting: Dietician

## 2015-12-29 ENCOUNTER — Other Ambulatory Visit: Payer: Self-pay | Admitting: Allergy and Immunology

## 2015-12-29 DIAGNOSIS — J449 Chronic obstructive pulmonary disease, unspecified: Secondary | ICD-10-CM

## 2016-01-01 ENCOUNTER — Telehealth: Payer: Self-pay | Admitting: *Deleted

## 2016-01-01 ENCOUNTER — Ambulatory Visit (HOSPITAL_COMMUNITY)
Admission: RE | Admit: 2016-01-01 | Discharge: 2016-01-01 | Disposition: A | Discharge status: Home / Self Care | Payer: Medicaid Other | Source: Ambulatory Visit | Attending: Oncology | Admitting: Oncology

## 2016-01-01 DIAGNOSIS — N281 Cyst of kidney, acquired: Secondary | ICD-10-CM | POA: Diagnosis not present

## 2016-01-01 DIAGNOSIS — R1032 Left lower quadrant pain: Secondary | ICD-10-CM | POA: Insufficient documentation

## 2016-01-01 DIAGNOSIS — D259 Leiomyoma of uterus, unspecified: Secondary | ICD-10-CM | POA: Diagnosis not present

## 2016-01-01 NOTE — Telephone Encounter (Signed)
Pt calling about Protonix refill - refilled on 8/14 but "phone in" instead of sent electronic. Informed pt - also I will call rx to [facility] on [address].

## 2016-01-23 ENCOUNTER — Ambulatory Visit (HOSPITAL_COMMUNITY): Payer: Medicaid Other

## 2016-01-27 ENCOUNTER — Telehealth: Payer: Self-pay

## 2016-01-28 ENCOUNTER — Ambulatory Visit: Payer: Medicaid Other | Admitting: Pharmacist

## 2016-02-11 ENCOUNTER — Ambulatory Visit (INDEPENDENT_AMBULATORY_CARE_PROVIDER_SITE_OTHER): Payer: Medicaid Other | Admitting: Internal Medicine

## 2016-02-11 DIAGNOSIS — J449 Chronic obstructive pulmonary disease, unspecified: Secondary | ICD-10-CM | POA: Diagnosis not present

## 2016-02-11 DIAGNOSIS — Z0000 Encounter for general adult medical examination without abnormal findings: Secondary | ICD-10-CM

## 2016-02-11 DIAGNOSIS — R030 Elevated blood-pressure reading, without diagnosis of hypertension: Secondary | ICD-10-CM | POA: Diagnosis not present

## 2016-02-11 DIAGNOSIS — Z8719 Personal history of other diseases of the digestive system: Secondary | ICD-10-CM

## 2016-02-11 DIAGNOSIS — Z23 Encounter for immunization: Secondary | ICD-10-CM

## 2016-02-11 DIAGNOSIS — J45909 Unspecified asthma, uncomplicated: Secondary | ICD-10-CM | POA: Diagnosis not present

## 2016-02-11 DIAGNOSIS — R21 Rash and other nonspecific skin eruption: Secondary | ICD-10-CM

## 2016-02-11 DIAGNOSIS — R1032 Left lower quadrant pain: Secondary | ICD-10-CM

## 2016-02-11 MED ORDER — DIPHENHYDRAMINE HCL 25 MG PO CAPS: 25.0000 mg | ORAL_CAPSULE | Freq: Two times a day (BID) | ORAL | 0 refills | Status: DC | PRN

## 2016-02-11 NOTE — Progress Notes (Signed)
   CC: Rash  HPI:  [name] is a 60 y.o. female with a past medical history listed below here today with complaints of of pruitic rash.  For details of today's visit and the status of her chronic medical issues please refer to the assessment and plan.   Past Medical History:  Diagnosis Date  . 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 Carpal tunnel syndrome 06/23/2011   Patient notes history of bilateral carpal tunnel syndrome.  Now has symptoms of right hand carpal tunnel.   [name] COPD (chronic obstructive pulmonary disease) ([place])    secondary to tobacco use // No PFTs on file  . ETOH abuse    Pt stopped in 3/08  . Gastrointestinal hemorrhage    secondary to PUD on March 2008  . OSA on CPAP    noncompliant with CPAP (2/2 it being depressing)  . Peptic ulcer disease    +H. pylori (antigen)- Dr. Olevia Perches- treated  . STRESS INCONTINENCE 01/24/2007  . Tobacco abuse    quit in 2010  . Urinary incontinence     Review of Systems:   Review of Systems  Constitutional: Negative for chills and fever.  Respiratory: Negative for cough and shortness of breath.   Skin: Positive for itching and rash.     Physical Exam:  Vitals:   02/11/16 1611  BP: 112/81  Pulse: (!) 58  Temp: 98.6 F (37 C)  TempSrc: Oral  SpO2: 100%  Weight: 243 lb 6.4 oz (110.4 kg)  Height: 5' (1.524 m)   Constitutional: She is oriented to person, place, and time. Morbidly obese female. No distress.  Cardiovascular: Normal rate, RRR, no m/r/g Pulmonary/Chest: CTAB Abdominal: Soft, non tender, non-distended.  Skin:Bright, erythematous rash across left forearm as well across right breat and upper chest. No vesicles or lesions present. No urticaria. Several scattered, healing  Vitals reviewed.  Assessment & Plan:   See Encounters Tab for problem based charting.  Patient seen with Dr. Evette Doffing

## 2016-02-11 NOTE — Patient Instructions (Addendum)
Maria Nelson,  Please follow up with your allergist next month. If the rash and itching get really bad try a benadryl. Try not to use it too often if you can. The hydrocortisone cream can thin out your skin as well so try to not use it daily.

## 2016-02-15 NOTE — Assessment & Plan Note (Signed)
Ultrasound was unremarkable. She reports that her abdominal pain has resolved on its own. She does have a history of diverticulosis.   Plan No further workup at this time as pain has resolved spontaneously Consider CT in the future if pain returns without any obvious source

## 2016-02-15 NOTE — Assessment & Plan Note (Signed)
She reports that her rash will resolve but inevitably returns. Recently, she broke out in hives over her right chest and breast. This occurred after eating alfredo pasta. She reports the only new ingredient that she is aware of was basil. Denies any associated SOB, edema or difficulty swallowing.   She also reports that a separate erythematous pruritic rash has erupted over her left arm in the past week. This rash is similar to her previous rashes. Has been using hydrocortisone cream with some relief. After discussion she does report that she has been eating more chicken and fish in her diet. The rash may be temporally associated with her eating red meat.   Plan Cetrizine  Benadryl and hydrocortisone cream prn Encouraged taking a food journal - try to eliminate red meat and monitor

## 2016-02-15 NOTE — Assessment & Plan Note (Signed)
Flu shot today 

## 2016-02-17 NOTE — Progress Notes (Signed)
Internal Medicine Clinic Attending  I saw and evaluated the patient.  I personally confirmed the key portions of the history and exam documented by Dr. Boswell and I reviewed pertinent patient test results.  The assessment, diagnosis, and plan were formulated together and I agree with the documentation in the resident's note. 

## 2016-02-26 ENCOUNTER — Telehealth: Payer: Self-pay

## 2016-02-29 NOTE — Progress Notes (Signed)
Unable to reach patient.

## 2016-03-11 IMAGING — CR DG CHEST 2V
2 series · 2 of 2 positions shown · non-contrast
Comparison: 09/08/2012

CLINICAL DATA: Shortness of breath, cough, mid sternal chest pain.
Reported history of asthma and COPD.

EXAM:
CHEST  2 VIEW

[w chest pa]
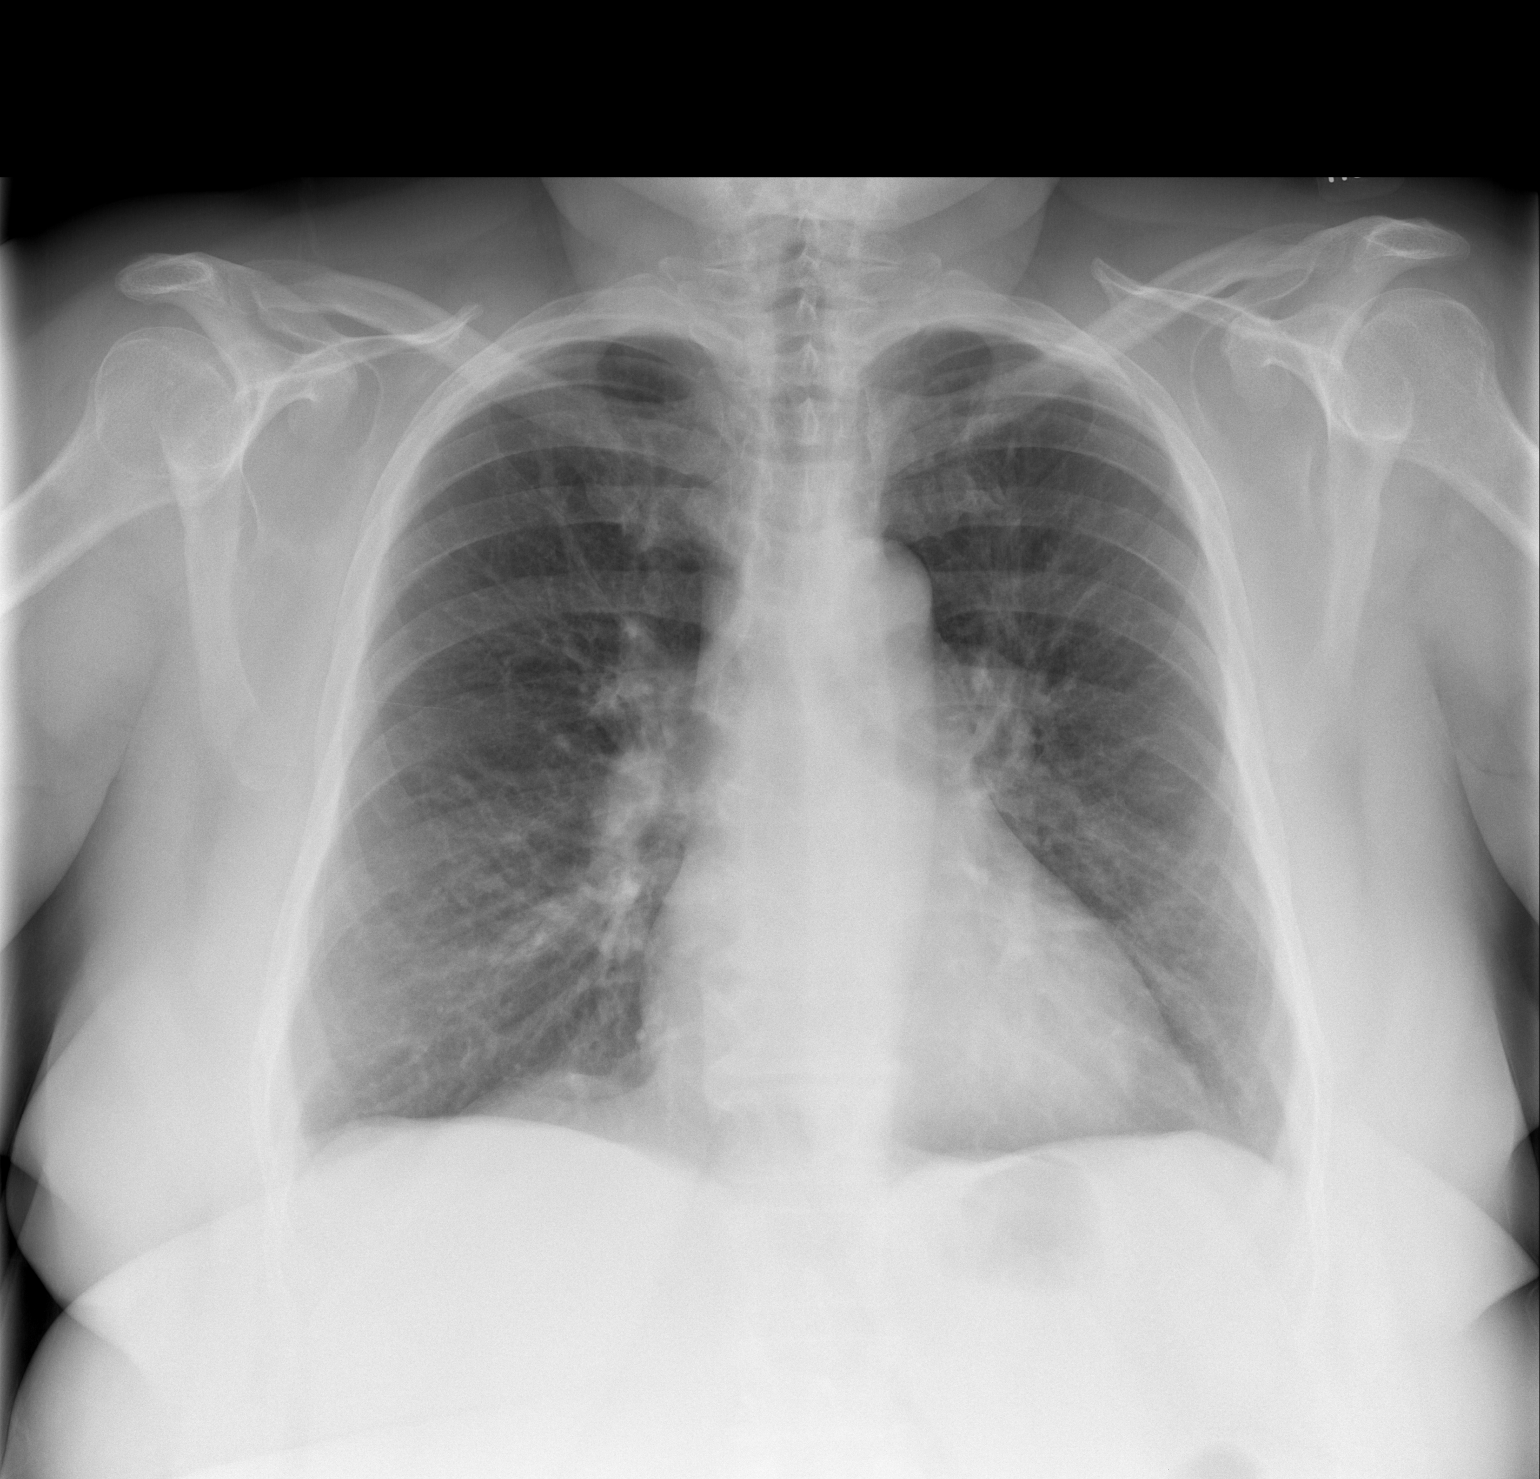

[w chest lat]
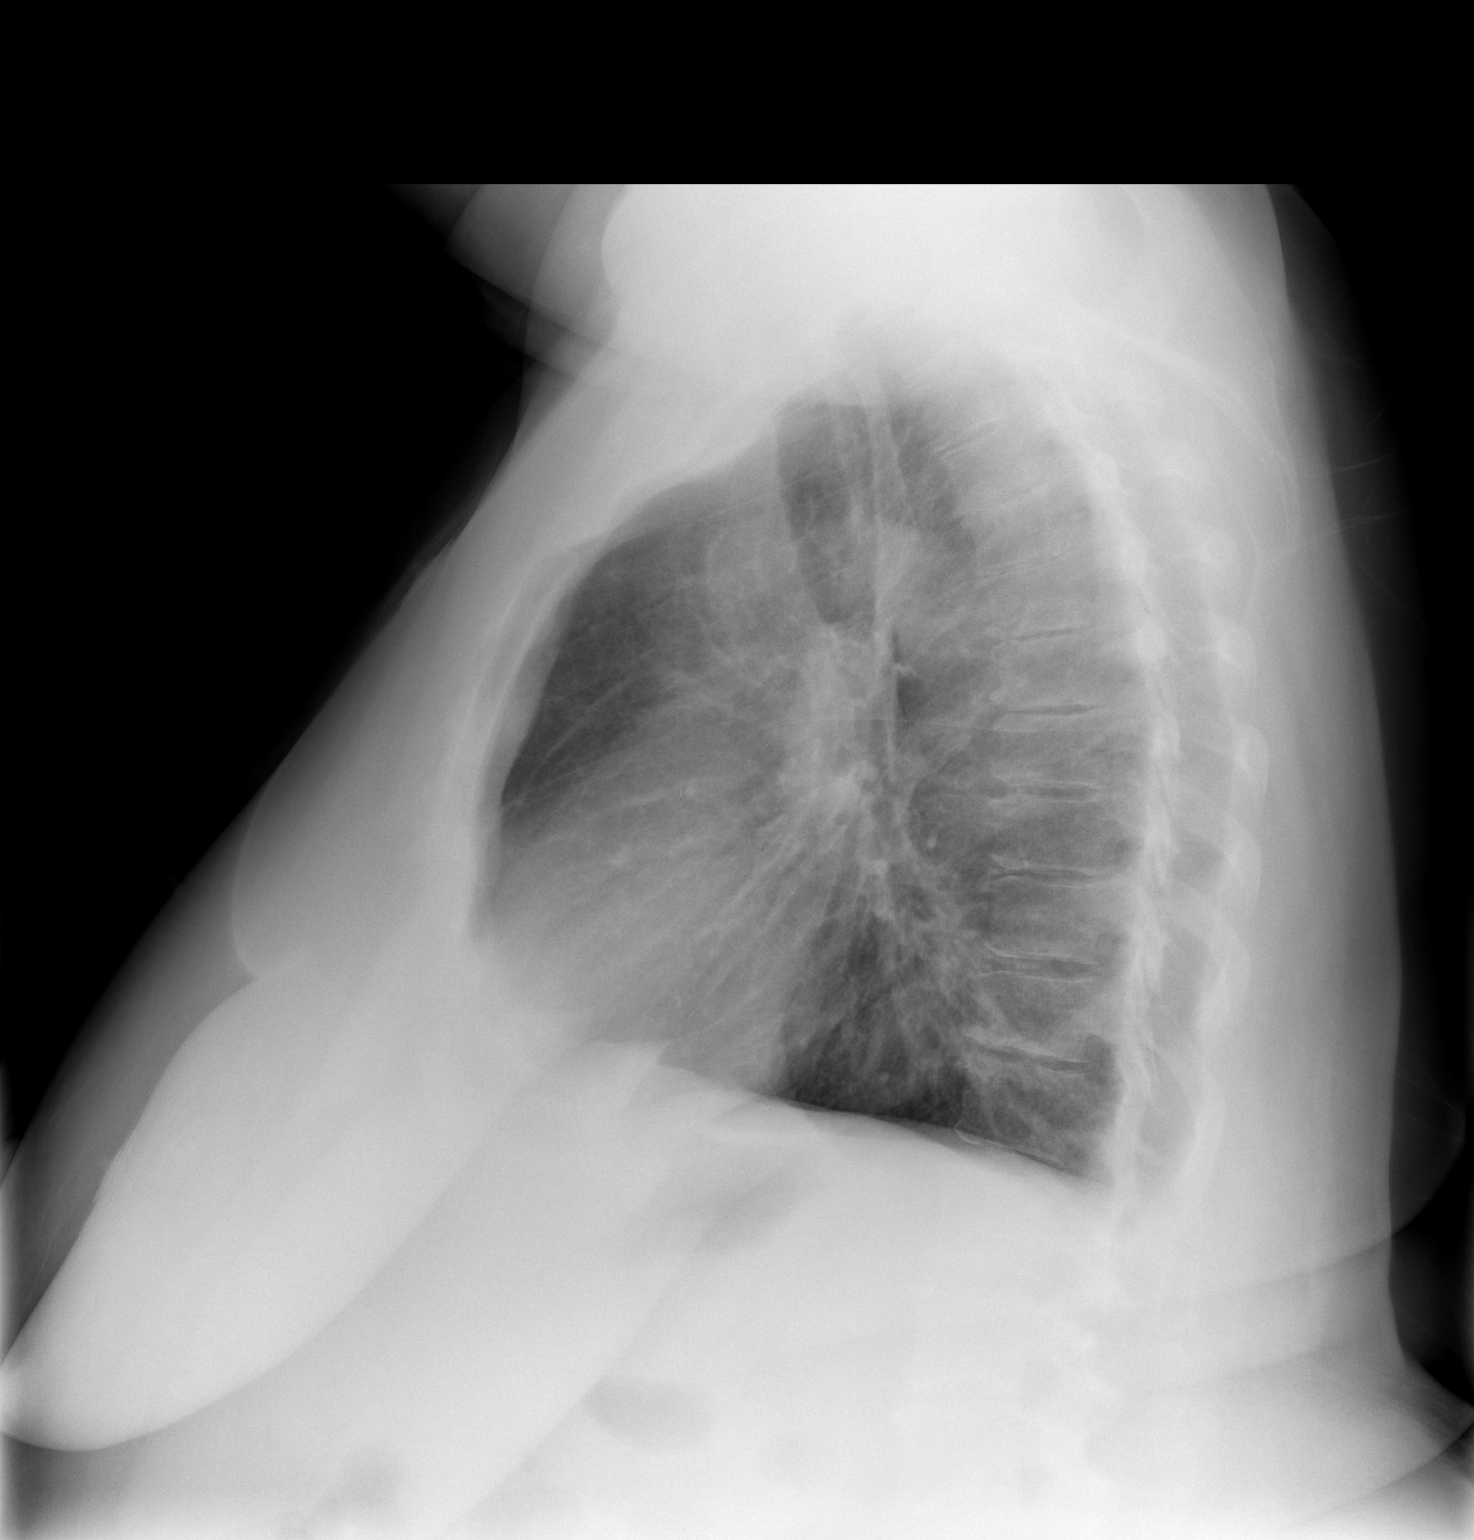

[2 of 2 positions shown; findings below may reference images not displayed]

FINDINGS: The heart size is at upper limits of normal. Flattening of the
hemidiaphragms suggest emphysema. Both lungs are clear. The
visualized skeletal structures are unremarkable.
IMPRESSION: No focal acute finding. Hemidiaphragmatic flattening suggests
emphysema.

## 2016-04-03 ENCOUNTER — Other Ambulatory Visit: Payer: Self-pay | Admitting: Internal Medicine

## 2016-05-12 ENCOUNTER — Other Ambulatory Visit: Payer: Self-pay | Admitting: Allergy and Immunology

## 2016-05-12 DIAGNOSIS — J449 Chronic obstructive pulmonary disease, unspecified: Secondary | ICD-10-CM

## 2016-05-16 ENCOUNTER — Other Ambulatory Visit: Payer: Self-pay | Admitting: Allergy and Immunology

## 2016-05-16 DIAGNOSIS — J449 Chronic obstructive pulmonary disease, unspecified: Secondary | ICD-10-CM

## 2016-05-25 ENCOUNTER — Emergency Department (HOSPITAL_COMMUNITY): Payer: Medicaid Other

## 2016-05-25 ENCOUNTER — Emergency Department (HOSPITAL_COMMUNITY)
Admission: EM | Admit: 2016-05-25 | Discharge: 2016-05-25 | Disposition: A | Discharge status: Home / Self Care | Payer: Medicaid Other | Attending: Emergency Medicine | Admitting: Emergency Medicine

## 2016-05-25 ENCOUNTER — Encounter (HOSPITAL_COMMUNITY): Payer: Self-pay

## 2016-05-25 DIAGNOSIS — Z87891 Personal history of nicotine dependence: Secondary | ICD-10-CM | POA: Diagnosis not present

## 2016-05-25 DIAGNOSIS — R05 Cough: Secondary | ICD-10-CM | POA: Diagnosis present

## 2016-05-25 DIAGNOSIS — R52 Pain, unspecified: Secondary | ICD-10-CM | POA: Insufficient documentation

## 2016-05-25 DIAGNOSIS — J449 Chronic obstructive pulmonary disease, unspecified: Secondary | ICD-10-CM | POA: Diagnosis not present

## 2016-05-25 DIAGNOSIS — R059 Cough, unspecified: Secondary | ICD-10-CM

## 2016-05-25 MED ORDER — ALBUTEROL SULFATE HFA 108 (90 BASE) MCG/ACT IN AERS
2.0000 | INHALATION_SPRAY | Freq: Four times a day (QID) | RESPIRATORY_TRACT | Status: DC | PRN
  Filled 2016-05-25: qty 6.7

## 2016-05-25 MED ORDER — IPRATROPIUM-ALBUTEROL 0.5-2.5 (3) MG/3ML IN SOLN
3.0000 mL | Freq: Once | RESPIRATORY_TRACT | Status: AC
  Administered 2016-05-25: 3 mL via RESPIRATORY_TRACT
  Filled 2016-05-25: qty 3

## 2016-05-25 MED ORDER — BENZONATATE 100 MG PO CAPS: 200.0000 mg | ORAL_CAPSULE | Freq: Two times a day (BID) | ORAL | 0 refills | Status: DC | PRN

## 2016-05-25 MED ORDER — SODIUM CHLORIDE 0.9 % IV BOLUS (SEPSIS)
1000.0000 mL | Freq: Once | INTRAVENOUS | Status: AC
  Administered 2016-05-25: 1000 mL via INTRAVENOUS

## 2016-05-25 NOTE — ED Notes (Signed)
Patient able to ambulate independently  

## 2016-05-25 NOTE — ED Notes (Signed)
Patient provided with [place] sandwich, okay'd by PA

## 2016-05-25 NOTE — ED Provider Notes (Signed)
[facility] DEPT Provider Note   CSN: [id number] Arrival date & time: 05/25/16  1301     History   Chief Complaint No chief complaint on file.   HPI Maria Nelson is a 61 y.o. female.  Patient past medical history of COPD, presents emergency department with chief complaint of flulike symptoms. She reports fevers, chills, sore throat, cough, generalized body aches. She states that her symptoms started 4 days ago. She has taken over-the-counter medications with modest relief. He denies any nausea, vomiting, or diarrhea. Denies any chest pain or shortness breath. There are no other associated symptoms.   The history is provided by the patient. No language interpreter was used.    Past Medical History:  Diagnosis Date  . 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 Carpal tunnel syndrome 06/23/2011   Patient notes history of bilateral carpal tunnel syndrome.  Now has symptoms of right hand carpal tunnel.   [name] COPD (chronic obstructive pulmonary disease) ([place])    secondary to tobacco use // No PFTs on file  . ETOH abuse    Pt stopped in 3/08  . Gastrointestinal hemorrhage    secondary to PUD on March 2008  . OSA on CPAP    noncompliant with CPAP (2/2 it being depressing)  . Peptic ulcer disease    +H. pylori (antigen)- Dr. Olevia Perches- treated  . STRESS INCONTINENCE 01/24/2007  . Tobacco abuse    quit in 2010  . Urinary incontinence     Patient Active Problem List   Diagnosis Date Noted  . Abdominal pain, left lower quadrant 12/01/2015  . Oral candidiasis 07/01/2015  . Urticaria 07/01/2015  . 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 Allergic rhinitis with nonallergic component 03/04/2015  . Rash and nonspecific skin eruption 11/17/2014  . Allergic rhinitis 08/16/2014  . COPD (chronic obstructive pulmonary disease) ([place]) 06/19/2014  . Blood pressure elevated without history of HTN 03/05/2014  . HNP (herniated nucleus pulposus), lumbar 04/16/2013  . 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  Tenosynovitis, de Quervain 11/15/2012  . Morbid obesity with BMI of 45.0-49.9, adult ([place]) 09/08/2012  . Carpal tunnel syndrome 06/23/2011  . Sleep apnea 09/02/2010  . Preventative health care 07/28/2010  . GERD 12/24/2009  . Angioedema/urticaria 12/16/2009  . STRESS INCONTINENCE 01/24/2007  . PEPTIC ULCER DISEASE 07/26/2006    Past Surgical History:  Procedure Laterality Date  . MANDIBLE SURGERY     due to trauma  . TUBAL LIGATION      OB History    No data available       Home Medications    Prior to Admission medications   Medication Sig Start Date End Date Taking? Authorizing Provider  albuterol (PROVENTIL HFA;VENTOLIN HFA) 108 (90 BASE) MCG/ACT inhaler Inhale 2 puffs into the lungs every 6 (six) hours as needed for wheezing. 03/05/14   [name], MD  albuterol (PROVENTIL) (2.5 MG/3ML) 0.083% nebulizer solution Use 1 vial in nebulizer every 1 hours as needed for wheezing 07/30/14   [name], MD  Azelastine HCl 0.15 % SOLN 1-2 sprays in each nostril 1-2 times daily as needed. 07/01/15   Adelina Mings, MD  beclomethasone (QVAR) 80 MCG/ACT inhaler Inhale 1 puff into the lungs 2 (two) times daily. During Asthma/COPD flares. Rinse, gargle and spit with water after use. 07/01/15   Adelina Mings, MD  cetirizine (ZYRTEC) 10 MG tablet TAKE ONE TABLET BY MOUTH ONCE DAILY 04/05/16   Maryellen Pile, MD  clotrimazole [facility]) 10 MG troche Slowly dissolve in the mouth  5 times per day for 10 days, not to be chewed or swallowed whole. 07/01/15   Adelina Mings, MD  desloratadine (CLARINEX) 5 MG tablet Take 5 mg by mouth daily.    Historical Provider, MD  diphenhydrAMINE (BENADRYL) 25 mg capsule Take 1 capsule (25 mg total) by mouth every 12 (twelve) hours as needed for itching or allergies. 02/11/16   Maryellen Pile, MD  DULERA 200-5 MCG/ACT AERO INHALE TWO PUFFS BY MOUTH TWICE DAILY TO PREVENT COUGH OR WHEEZE. RINSE GARGLE AND SPIT AFTER 05/17/16   Adelina Mings, MD  EPINEPHrine (EPIPEN 2-PAK) 0.3 mg/0.3 mL IJ SOAJ injection Inject into the muscle once.    Historical Provider, MD  gabapentin (NEURONTIN) 300 MG capsule Take 1 capsule (300 mg total) by mouth 2 (two) times daily. 09/10/15 09/09/16  Maryellen Pile, MD  hydrocortisone cream 1 % Apply to affected area 2-4 times daily as needed 12/01/15 11/30/16  Maryellen Pile, MD  ipratropium (ATROVENT) 0.02 % nebulizer solution INHALE ONE VIAL BY NEBULIZER EVERY 6 HOURS AS NEEDED 07/01/15   Adelina Mings, MD  pantoprazole (PROTONIX) 40 MG tablet Take 1 tablet (40 mg total) by mouth daily. 12/01/15   Maryellen Pile, MD  terbinafine (LAMISIL) 1 % cream Apply 1 application topically 2 (two) times daily. 11/27/14   Maryellen Pile, MD    Family History Family History  Problem Relation Age of Onset  . Diabetes Sister   . Breast cancer Sister 70    died 2/2 breast ca age 60-60  . Diabetes Mother   . Coronary artery disease Mother 58    requiring 5 vessel CABG    Social History Social History  Substance Use Topics  . Smoking status: Former Smoker    Packs/day: 0.40    Types: Cigarettes    Quit date: 12/04/2010  . Smokeless tobacco: Not on file     Comment: stopped 3 weeks ago  . Alcohol use 0.0 oz/week     Comment: now drinking a beer on weekends //      Allergies   Bee venom and Aspirin   Review of Systems Review of Systems  Constitutional: Positive for fever.  Respiratory: Positive for cough and wheezing.   All other systems reviewed and are negative.    Physical Exam Updated Vital Signs BP 110/65   Pulse 61   Temp 98 F (36.7 C) (Oral)   Resp 18   SpO2 97%   Physical Exam  Constitutional: She is oriented to person, place, and time. She appears well-developed and well-nourished.  HENT:  Head: Normocephalic and atraumatic.  Eyes: Conjunctivae and EOM are normal. Pupils are equal, round, and reactive to light.  Neck: Normal range of motion. Neck supple.    Cardiovascular: Normal rate and regular rhythm.  Exam reveals no gallop and no friction rub.   No murmur heard. Pulmonary/Chest: Effort normal. No respiratory distress. She has wheezes. She has no rales. She exhibits no tenderness.  Wheezing bilaterally  Abdominal: Soft. Bowel sounds are normal. She exhibits no distension and no mass. There is no tenderness. There is no rebound and no guarding.  Musculoskeletal: Normal range of motion. She exhibits no edema or tenderness.  Neurological: She is alert and oriented to person, place, and time.  Skin: Skin is warm and dry.  Psychiatric: She has a normal mood and affect. Her behavior is normal. Judgment and thought content normal.  Nursing note and vitals reviewed.    ED Treatments / Results  Labs (  all labs ordered are listed, but only abnormal results are displayed) Labs Reviewed - No data to display  EKG  EKG Interpretation None       Radiology No results found.  Procedures Procedures (including critical care time)  Medications Ordered in ED Medications  ipratropium-albuterol (DUONEB) 0.5-2.5 (3) MG/3ML nebulizer solution 3 mL (not administered)  sodium chloride 0.9 % bolus 1,000 mL (1,000 mLs Intravenous New Bag/Given 05/25/16 1555)     Initial Impression / Assessment and Plan / ED Course  I have reviewed the triage vital signs and the nursing notes.  Pertinent labs & imaging results that were available during my care of the patient were reviewed by me and considered in my medical decision making (see chart for details).     Patient with Cough and body aches. Has some wheezing on exam. Will give breathing treatment. Will check chest x-ray given adventitious lung sounds.  Chest x-ray shows possible bronchitic changes, no alveolar consolidation or CHF. Patient is afebrile. Vital signs are stable. Symptoms seem more URI and flu, but nevertheless she is out of the treatment window for Tamiflu. I will recommend supportive  therapies with cough medicine, fluids, and rest. Return precautions given. Patient is stable and ready for discharge. She feels greatly improved after breathing treatment, and her lungs now sound clear.  Final Clinical Impressions(s) / ED Diagnoses   Final diagnoses:  Cough    New Prescriptions New Prescriptions   BENZONATATE (TESSALON) 100 MG CAPSULE    Take 2 capsules (200 mg total) by mouth 2 (two) times daily as needed for cough.     Montine Circle, PA-C 05/25/16 Sharon, MD 05/27/16 [phone]

## 2016-05-25 NOTE — ED Triage Notes (Signed)
Patient complains of body aches since Friday. Using otc meds with minimal relief, fatigue with same. Alert and oriented, NAD

## 2016-06-15 ENCOUNTER — Other Ambulatory Visit: Payer: Self-pay | Admitting: Allergy and Immunology

## 2016-06-15 DIAGNOSIS — J449 Chronic obstructive pulmonary disease, unspecified: Secondary | ICD-10-CM

## 2016-06-15 NOTE — Telephone Encounter (Signed)
We denied refill for Maria Nelson 200 , he previously got an courtesy refill in January. Patient needs office visit for further refills.

## 2016-06-23 ENCOUNTER — Ambulatory Visit (INDEPENDENT_AMBULATORY_CARE_PROVIDER_SITE_OTHER): Payer: Medicaid Other | Admitting: Internal Medicine

## 2016-06-23 ENCOUNTER — Other Ambulatory Visit: Payer: Self-pay | Admitting: Allergy and Immunology

## 2016-06-23 VITALS — BP 119/77 | HR 65 | Temp 97.6°F | Ht 60.0 in | Wt 248.5 lb

## 2016-06-23 DIAGNOSIS — M5441 Lumbago with sciatica, right side: Secondary | ICD-10-CM | POA: Diagnosis present

## 2016-06-23 DIAGNOSIS — Z79899 Other long term (current) drug therapy: Secondary | ICD-10-CM | POA: Diagnosis not present

## 2016-06-23 DIAGNOSIS — Z8619 Personal history of other infectious and parasitic diseases: Secondary | ICD-10-CM

## 2016-06-23 DIAGNOSIS — Z87891 Personal history of nicotine dependence: Secondary | ICD-10-CM

## 2016-06-23 DIAGNOSIS — M549 Dorsalgia, unspecified: Secondary | ICD-10-CM | POA: Insufficient documentation

## 2016-06-23 DIAGNOSIS — Z7951 Long term (current) use of inhaled steroids: Secondary | ICD-10-CM

## 2016-06-23 DIAGNOSIS — J449 Chronic obstructive pulmonary disease, unspecified: Secondary | ICD-10-CM

## 2016-06-23 DIAGNOSIS — G8929 Other chronic pain: Secondary | ICD-10-CM

## 2016-06-23 DIAGNOSIS — G629 Polyneuropathy, unspecified: Secondary | ICD-10-CM | POA: Diagnosis not present

## 2016-06-23 DIAGNOSIS — K279 Peptic ulcer, site unspecified, unspecified as acute or chronic, without hemorrhage or perforation: Secondary | ICD-10-CM | POA: Diagnosis not present

## 2016-06-23 MED ORDER — MOMETASONE FURO-FORMOTEROL FUM 200-5 MCG/ACT IN AERO: INHALATION_SPRAY | RESPIRATORY_TRACT | 2 refills | Status: DC

## 2016-06-23 MED ORDER — MELOXICAM 7.5 MG PO TABS: 7.5000 mg | ORAL_TABLET | Freq: Every day | ORAL | 0 refills | Status: AC

## 2016-06-23 NOTE — Patient Instructions (Signed)
For your back pain --start taking mobic once a day with a meal for one week --you can take Tylenol 1000mg  up to three times a day as well --use heating pad to the area that hurts a few times a day if you can --continue propping your back up with a pillow while you're sitting as this helps relieve some pressure off of your back  For the numbness and tingling in your legs, start taking gabapentin again. Start with one pill (300mg ) at night as it may cause some sedation. If this dose is ok with you after 2-3 days, you can increase it to one pill twice a day.  Set up an appointment with Dr. Charlynn Grimes in April for a follow up.  If your pain does not get any better, it gets worse, you start having bowel or bladder incontinence, please return to the clinic sooner.    Back Pain, Adult Back pain is very common in adults.The cause of back pain is rarely dangerous and the pain often gets better over time.The cause of your back pain may not be known. Some common causes of back pain include:  Strain of the muscles or ligaments supporting the spine.  Wear and tear (degeneration) of the spinal disks.  Arthritis.  Direct injury to the back. For many people, back pain may return. Since back pain is rarely dangerous, most people can learn to manage this condition on their own. Follow these instructions at home: Watch your back pain for any changes. The following actions may help to lessen any discomfort you are feeling:  Remain active. It is stressful on your back to sit or stand in one place for long periods of time. Do not sit, drive, or stand in one place for more than 30 minutes at a time. Take short walks on even surfaces as soon as you are able.Try to increase the length of time you walk each day.  Exercise regularly as directed by your health care provider. Exercise helps your back heal faster. It also helps avoid future injury by keeping your muscles strong and flexible.  Do not stay in  bed.Resting more than 1-2 days can delay your recovery.  Pay attention to your body when you bend and lift. The most comfortable positions are those that put less stress on your recovering back. Always use proper lifting techniques, including:  Bending your knees.  Keeping the load close to your body.  Avoiding twisting.  Find a comfortable position to sleep. Use a firm mattress and lie on your side with your knees slightly bent. If you lie on your back, put a pillow under your knees.  Avoid feeling anxious or stressed.Stress increases muscle tension and can worsen back pain.It is important to recognize when you are anxious or stressed and learn ways to manage it, such as with exercise.  Take medicines only as directed by your health care provider. Over-the-counter medicines to reduce pain and inflammation are often the most helpful.Your health care provider may prescribe muscle relaxant drugs.These medicines help dull your pain so you can more quickly return to your normal activities and healthy exercise.  Apply ice to the injured area:  Put ice in a plastic bag.  Place a towel between your skin and the bag.  Leave the ice on for 20 minutes, 2-3 times a day for the first 2-3 days. After that, ice and heat may be alternated to reduce pain and spasms.  Maintain a healthy weight. Excess weight puts extra stress  on your back and makes it difficult to maintain good posture. Contact a health care provider if:  You have pain that is not relieved with rest or medicine.  You have increasing pain going down into the legs or buttocks.  You have pain that does not improve in one week.  You have night pain.  You lose weight.  You have a fever or chills. Get help right away if:  You develop new bowel or bladder control problems.  You have unusual weakness or numbness in your arms or legs.  You develop nausea or vomiting.  You develop abdominal pain.  You feel faint. This  information is not intended to replace advice given to you by your health care provider. Make sure you discuss any questions you have with your health care provider. Document Released: 04/05/2005 Document Revised: 08/14/2015 Document Reviewed: 08/07/2013 Elsevier Interactive Patient Education  2017 [employer].

## 2016-06-23 NOTE — Progress Notes (Signed)
CC: back pain  HPI:  [name] is a 61 y.o. with a PMH of herniated lumbar disc, COPD, GERD, h/o H pylori PUD with hemorrhage, and obesity presenting to clinic with back pain and leg pain.   Patient states that she has been having sharp right sided back pain for the past few days. She denies trauma, heavy lifting or change in her activity that would have exacerbated this episodes. She endorses radiation into her right buttock. She denies that anything helps the pain. It is worsened with movement but does not change with position change. 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 This is not acutely worse at this time; she denies leg weakness, saddle parasthesia, bowel and bladder incontinence. Patient states that she has not been taking gabapentin for a while now; she thought it was a PRN medicine and doesn't know what it is for. She has taken tylenol at home with no improvement. She denies urinary symptoms.  Please see problem based Assessment and Plan for status of patients chronic conditions.  Past Medical History:  Diagnosis Date  . 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 Carpal tunnel syndrome 06/23/2011   Patient notes history of bilateral carpal tunnel syndrome.  Now has symptoms of right hand carpal tunnel.   [name] COPD (chronic obstructive pulmonary disease) ([place])    secondary to tobacco use // No PFTs on file  . ETOH abuse    Pt stopped in 3/08  . Gastrointestinal hemorrhage    secondary to PUD on March 2008  . OSA on CPAP    noncompliant with CPAP (2/2 it being depressing)  . Peptic ulcer disease    +H. pylori (antigen)- Dr. Olevia Perches- treated  . STRESS INCONTINENCE 01/24/2007  . Tobacco abuse    quit in 2010  . Urinary incontinence     Review of Systems:   Review of Systems  Constitutional: Negative for chills, fever and malaise/fatigue.  Genitourinary: Negative for dysuria, flank pain, frequency, hematuria and urgency.    Musculoskeletal: Positive for back pain. Negative for falls and myalgias.  Neurological: Negative for tingling, sensory change and focal weakness.    Physical Exam:  Vitals:   06/23/16 1458  BP: 119/77  Pulse: 65  Temp: 97.6 F (36.4 C)  TempSrc: Oral  SpO2: 96%  Weight: 248 lb 8 oz (112.7 kg)  Height: 5' (1.524 m)   Physical Exam  Constitutional: She is oriented to person, place, and time. She appears well-developed and well-nourished. She appears distressed.  HENT:  Head: Normocephalic and atraumatic.  Eyes: EOM are normal.  Neck: Normal range of motion.  Cardiovascular: Regular rhythm, normal heart sounds and intact distal pulses.   Pulmonary/Chest: Effort normal and breath sounds normal. She has no wheezes. She has no rales.  Abdominal: Soft. Bowel sounds are normal. She exhibits no distension. There is no tenderness. There is no guarding.  Musculoskeletal: Normal range of motion. She exhibits no edema.  Tenderness over lumbar spine; no paraspinal muscular tenderness; LE strength, sensation and DTRs intact  Neurological: She is alert and oriented to person, place, and time. She displays normal reflexes. No cranial nerve deficit or sensory deficit. She exhibits normal muscle tone.  Skin: Skin is warm and dry. Capillary refill takes less than 2 seconds. No rash noted. She is not diaphoretic. No erythema.  Psychiatric: She has a normal mood and affect. Her behavior is normal. Judgment and thought content normal.    Assessment & Plan:  See Encounters Tab for problem based charting.   Patient discussed with Dr. Carmel Sacramento, MD Internal Medicine PGY1

## 2016-06-24 ENCOUNTER — Other Ambulatory Visit: Payer: Self-pay

## 2016-06-24 DIAGNOSIS — J449 Chronic obstructive pulmonary disease, unspecified: Secondary | ICD-10-CM

## 2016-06-26 ENCOUNTER — Encounter: Payer: Self-pay | Admitting: Internal Medicine

## 2016-06-26 NOTE — Assessment & Plan Note (Signed)
Refilled patient's Maria Nelson.

## 2016-06-26 NOTE — Assessment & Plan Note (Signed)
Patient states that she has been having sharp right sided back pain for the past few days. She denies trauma, heavy lifting or change in her activity that would have exacerbated this episodes. She endorses radiation into her right buttock. She denies that anything helps the pain. It is worsened with movement but does not change with position change. 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 This is not acutely worse at this time; she denies leg weakness, saddle parasthesia, bowel and bladder incontinence. Patient states that she has not been taking gabapentin for a while now; she thought it was a PRN medicine and doesn't know what it is for. She has taken tylenol at home with no improvement. She denies urinary symptoms.  Symptoms and exam consistent with exacerbation of chronic low back pain from herniated disk. She does not present with alarm symptoms.   Plan: --meloxicam 7.5mg  daily x7 days; patient compliant with PPI and has been treated for H pylori which was the cause of her PUD. --Tylenol --heat --patient instructed in restarting her gabapentin, starting with 300mg  qhs then if tolerated well increase to BID

## 2016-06-30 NOTE — Progress Notes (Signed)
Internal Medicine Clinic Attending  Case discussed with Dr. Saraiya at the time of the visit.  We reviewed the resident's history and exam and pertinent patient test results.  I agree with the assessment, diagnosis, and plan of care documented in the resident's note.  

## 2016-07-01 ENCOUNTER — Other Ambulatory Visit: Payer: Self-pay | Admitting: Oncology

## 2016-07-01 ENCOUNTER — Other Ambulatory Visit: Payer: Self-pay | Admitting: Internal Medicine

## 2016-07-01 ENCOUNTER — Other Ambulatory Visit: Payer: Self-pay | Admitting: Family Medicine

## 2016-07-01 DIAGNOSIS — Z1231 Encounter for screening mammogram for malignant neoplasm of breast: Secondary | ICD-10-CM

## 2016-07-08 NOTE — Addendum Note (Signed)
Addended by: Alphonzo Grieve on: 07/08/2016 05:25 PM   Modules accepted: Level of Service

## 2016-08-10 ENCOUNTER — Telehealth: Payer: Self-pay | Admitting: Internal Medicine

## 2016-08-10 ENCOUNTER — Other Ambulatory Visit: Payer: Self-pay | Admitting: Internal Medicine

## 2016-08-10 NOTE — Telephone Encounter (Signed)
APT. REMINDER CALL, NO ANSWER, NO VOICEMAIL °

## 2016-08-11 ENCOUNTER — Encounter: Payer: Self-pay | Admitting: Internal Medicine

## 2016-08-11 ENCOUNTER — Ambulatory Visit (INDEPENDENT_AMBULATORY_CARE_PROVIDER_SITE_OTHER): Payer: Medicaid Other | Admitting: Internal Medicine

## 2016-08-11 ENCOUNTER — Encounter (INDEPENDENT_AMBULATORY_CARE_PROVIDER_SITE_OTHER): Payer: Self-pay

## 2016-08-11 ENCOUNTER — Ambulatory Visit (HOSPITAL_COMMUNITY)
Admission: RE | Admit: 2016-08-11 | Discharge: 2016-08-11 | Disposition: A | Discharge status: Home / Self Care | Payer: Medicaid Other | Source: Ambulatory Visit | Attending: Internal Medicine | Admitting: Internal Medicine

## 2016-08-11 VITALS — BP 114/74 | HR 61 | Temp 98.1°F | Ht 60.0 in | Wt 252.6 lb

## 2016-08-11 DIAGNOSIS — M25562 Pain in left knee: Secondary | ICD-10-CM | POA: Insufficient documentation

## 2016-08-11 DIAGNOSIS — L989 Disorder of the skin and subcutaneous tissue, unspecified: Secondary | ICD-10-CM

## 2016-08-11 DIAGNOSIS — Z9181 History of falling: Secondary | ICD-10-CM | POA: Diagnosis not present

## 2016-08-11 NOTE — Progress Notes (Signed)
   CC: Knee pain  HPI:  [name] is a 61 y.o. female with a past medical history listed below here today with complaints of knee pain.  For details of today's visit and the status of her chronic medical issues please refer to the assessment and plan.  Past Medical History:  Diagnosis Date  . 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 Carpal tunnel syndrome 06/23/2011   Patient notes history of bilateral carpal tunnel syndrome.  Now has symptoms of right hand carpal tunnel.   [name] COPD (chronic obstructive pulmonary disease) ([place])    secondary to tobacco use // No PFTs on file  . ETOH abuse    Pt stopped in 3/08  . Gastrointestinal hemorrhage    secondary to PUD on March 2008  . OSA on CPAP    noncompliant with CPAP (2/2 it being depressing)  . Peptic ulcer disease    +H. pylori (antigen)- Dr. Olevia Perches- treated  . STRESS INCONTINENCE 01/24/2007  . Tobacco abuse    quit in 2010  . Urinary incontinence     Review of Systems:   No chest pain or shortness of breath  Physical Exam:  Vitals:   08/11/16 1515  BP: 114/74  Pulse: 61  Temp: 98.1 F (36.7 C)  TempSrc: Oral  SpO2: 95%  Weight: 252 lb 9.6 oz (114.6 kg)  Height: 5' (1.524 m)   Physical Exam  Constitutional: She is oriented to person, place, and time and well-developed, well-nourished, and in no distress.  Cardiovascular: Normal rate and regular rhythm.   Pulmonary/Chest: Effort normal and breath sounds normal.  Abdominal: Soft. Bowel sounds are normal.  Neurological: She is alert and oriented to person, place, and time. She has normal sensation, normal strength and normal reflexes.  Skin: Skin is warm and dry.  Skin lesion above left eye, see picture for details  Vitals reviewed.      Assessment & Plan:   See Encounters Tab for problem based charting.  Patient discussed with Dr. Lynnae January

## 2016-08-11 NOTE — Patient Instructions (Signed)
Maria Nelson,  We will get an XR of your knee today.  We will get you seen by the dermatology.  Please follow up with me in 2-3 months.

## 2016-08-12 ENCOUNTER — Ambulatory Visit
Admission: RE | Admit: 2016-08-12 | Discharge: 2016-08-12 | Disposition: A | Discharge status: Home / Self Care | Payer: Medicaid Other | Source: Ambulatory Visit | Attending: Family Medicine | Admitting: Family Medicine

## 2016-08-12 DIAGNOSIS — Z1231 Encounter for screening mammogram for malignant neoplasm of breast: Secondary | ICD-10-CM | POA: Diagnosis not present

## 2016-08-16 ENCOUNTER — Encounter: Payer: Self-pay | Admitting: Allergy and Immunology

## 2016-08-16 ENCOUNTER — Ambulatory Visit (INDEPENDENT_AMBULATORY_CARE_PROVIDER_SITE_OTHER): Payer: Medicaid Other | Admitting: Allergy and Immunology

## 2016-08-16 VITALS — BP 122/88 | HR 60 | Temp 98.6°F | Resp 16

## 2016-08-16 DIAGNOSIS — T783XXD Angioneurotic edema, subsequent encounter: Secondary | ICD-10-CM

## 2016-08-16 DIAGNOSIS — J449 Chronic obstructive pulmonary disease, unspecified: Secondary | ICD-10-CM | POA: Diagnosis not present

## 2016-08-16 DIAGNOSIS — J3089 Other allergic rhinitis: Secondary | ICD-10-CM | POA: Diagnosis not present

## 2016-08-16 DIAGNOSIS — G8929 Other chronic pain: Secondary | ICD-10-CM

## 2016-08-16 DIAGNOSIS — M25562 Pain in left knee: Secondary | ICD-10-CM | POA: Insufficient documentation

## 2016-08-16 DIAGNOSIS — L989 Disorder of the skin and subcutaneous tissue, unspecified: Secondary | ICD-10-CM | POA: Insufficient documentation

## 2016-08-16 HISTORY — DX: Other chronic pain: G89.29

## 2016-08-16 MED ORDER — MOMETASONE FURO-FORMOTEROL FUM 200-5 MCG/ACT IN AERO: INHALATION_SPRAY | RESPIRATORY_TRACT | 2 refills | Status: DC

## 2016-08-16 NOTE — Assessment & Plan Note (Signed)
>>  ASSESSMENT AND PLAN FOR URTICARIA/ANGIOEDEMA WRITTEN ON 08/16/2016  3:41 PM BY BOBBITT, Heywood Iles, MD  Idiopathic angioedema/urticaria.  Lab work checked last year was unrevealing. I recommended a nonsedating second generation antihistamine.  She may take fexofenadine (Allegra) 60 mg 1-2 times daily as needed. Should significant symptoms recur or new symptoms occur, a journal is to be kept recording any foods eaten, beverages consumed, medications taken, activities performed, and environmental conditions within a 6 hour time period prior to the onset of symptoms. For any symptoms concerning for anaphylaxis, epinephrine is to be administered and 911 is to be called immediately.

## 2016-08-16 NOTE — Assessment & Plan Note (Signed)
Stable.  Continue appropriate allergen avoidance measures, azelastine nasal spray as needed, and nasal saline irrigation as needed.  Fexofenadine (Allegra) 60 mg 1-2 times daily as needed.

## 2016-08-16 NOTE — Patient Instructions (Addendum)
Angioedema/urticaria Idiopathic angioedema/urticaria.  Lab work checked last year was unrevealing.  I recommended a nonsedating second generation antihistamine.  She may take fexofenadine (Allegra) 60 mg 1-2 times daily as needed.  Should significant symptoms recur or new symptoms occur, a journal is to be kept recording any foods eaten, beverages consumed, medications taken, activities performed, and environmental conditions within a 6 hour time period prior to the onset of symptoms. For any symptoms concerning for anaphylaxis, epinephrine is to be administered and 911 is to be called immediately.   Asthma with COPD  Restart/continue Dulera 200-5 g, 2 inhalations via spacer device twice a day.  Continue albuterol HFA, 1-2 inhalations every 4-6 hours as needed and 15 minutes prior to moderate/vigorous physical exertion.  Subjective and objective measures of pulmonary function will be followed and the treatment plan will be adjusted accordingly.  Allergic rhinitis with nonallergic component Stable.  Continue appropriate allergen avoidance measures, azelastine nasal spray as needed, and nasal saline irrigation as needed.  Fexofenadine (Allegra) 60 mg 1-2 times daily as needed.   Return in about 5 months (around 01/16/2017), or if symptoms worsen or fail to improve.

## 2016-08-16 NOTE — Assessment & Plan Note (Signed)
Idiopathic angioedema/urticaria.  Lab work checked last year was unrevealing.  I recommended a nonsedating second generation antihistamine.  She may take fexofenadine (Allegra) 60 mg 1-2 times daily as needed.  Should significant symptoms recur or new symptoms occur, a journal is to be kept recording any foods eaten, beverages consumed, medications taken, activities performed, and environmental conditions within a 6 hour time period prior to the onset of symptoms. For any symptoms concerning for anaphylaxis, epinephrine is to be administered and 911 is to be called immediately.

## 2016-08-16 NOTE — Assessment & Plan Note (Signed)
   Restart/continue Dulera 200-5 g, 2 inhalations via spacer device twice a day.  Continue albuterol HFA, 1-2 inhalations every 4-6 hours as needed and 15 minutes prior to moderate/vigorous physical exertion.  Subjective and objective measures of pulmonary function will be followed and the treatment plan will be adjusted accordingly.

## 2016-08-16 NOTE — Assessment & Plan Note (Signed)
Has complaints of left knee pain. Reports no history of knee pain. Reports falling on her left knee about 2 years ago. Says that she starting having pain starting about a month ago. Reports hearing popping noises frequently. Pain is worst with going up and down stairs. No recent trauma. Says it feels like her knee is slipping. Feels like her knee is going to give out on her at times as well as catching at times. No weakness in her leg. Leg does get numb at times. Has pain constantly even at rest. Has not been taking anything for the pain. Nothing seems to make it better.   Plan: XR today with joint space narrowing, predominantly medially. Most likely representative of OA especially given her obesity. Given the instability and acuity concerning for more significant injury. Discussed with patient and would prefer to have knee MRI for further evaluation.

## 2016-08-16 NOTE — Progress Notes (Signed)
Follow-up Note  RE: Maria Nelson MRN: [id number] DOB: 06-06-1955 Date of Office Visit: 08/16/2016  Primary care provider: Maryellen Pile, MD Referring provider: Maryellen Pile, MD  History of present illness: Maria Nelson is a 61 y.o. female with angioedema/urticaria, asthma/COPD, and allergic rhinitis presenting today for follow up.  She was last seen in this clinic in July 2017.  She reports last week she had mild swelling of her lower lip and today  had mild swelling of her chin.  She is not experiencing concomitant urticaria, cardiopulmonary symptoms, or GI symptoms. No specific medication, food, skin care product, detergent, soap, or other environmental triggers have been identified.  She typically takes hydroxyzine as needed when she experiences urticaria or angioedema, however she states that she ran out of this medication.  She reports that her asthma had been well-controlled, however recently she has been experiencing increased dyspnea on exertion.  She believes though that her Dulera inhaler device may be defective as the counter keeps on resetting and she is uncertain if she is actually receiving medication when using this device.  She has no nasal symptom complaints today.   Assessment and plan: Angioedema/urticaria Idiopathic angioedema/urticaria.  Lab work checked last year was unrevealing.  I recommended a nonsedating second generation antihistamine.  She may take fexofenadine (Allegra) 60 mg 1-2 times daily as needed.  Should significant symptoms recur or new symptoms occur, a journal is to be kept recording any foods eaten, beverages consumed, medications taken, activities performed, and environmental conditions within a 6 hour time period prior to the onset of symptoms. For any symptoms concerning for anaphylaxis, epinephrine is to be administered and 911 is to be called immediately.   Asthma with COPD  Restart/continue Dulera 200-5 g, 2 inhalations via  spacer device twice a day.  Continue albuterol HFA, 1-2 inhalations every 4-6 hours as needed and 15 minutes prior to moderate/vigorous physical exertion.  Subjective and objective measures of pulmonary function will be followed and the treatment plan will be adjusted accordingly.  Allergic rhinitis with nonallergic component Stable.  Continue appropriate allergen avoidance measures, azelastine nasal spray as needed, and nasal saline irrigation as needed.  Fexofenadine (Allegra) 60 mg 1-2 times daily as needed.   Meds ordered this encounter  Medications  . mometasone-formoterol (DULERA) 200-5 MCG/ACT AERO    Sig: INHALE TWO PUFFS BY MOUTH TWICE DAILY TO PREVENT COUGH OR WHEEZE. RINSE GARGLE AND SPIT AFTER    Dispense:  13 g    Refill:  2    Patient must have an appointment for further refills.    Diagnostics: Spirometry today reveals an FVC of 1.56 L and an FEV1 of 1.19 L with an FEV1 ratio of 94%.  Mild restrictive pattern without significant change from previous study, possibly secondary to body habitus.  Please see scanned spirometry results for details.    Physical examination: Blood pressure 122/88, pulse 60, temperature 98.6 F (37 C), temperature source Oral, resp. rate 16, SpO2 95 %.  General: Alert, interactive, in no acute distress. HEENT: TMs pearly gray, turbinates minimally edematous without discharge, post-pharynx mildly erythematous. Neck: Supple without lymphadenopathy. Lungs: Mildly decreased breath sounds bilaterally without wheezing, rhonchi or rales. CV: Normal S1, S2 without murmurs. Skin: Warm and dry, without lesions or rashes.  The following portions of the patient's history were reviewed and updated as appropriate: allergies, current medications, past family history, past medical history, past social history, past surgical history and problem list.  Allergies as of 08/16/2016  Reactions   Bee Venom Anaphylaxis   Aspirin Nausea Only        Medication List       Accurate as of 08/16/16  5:36 PM. Always use your most recent med list.          albuterol 108 (90 Base) MCG/ACT inhaler Commonly known as:  PROVENTIL HFA;VENTOLIN HFA Inhale 2 puffs into the lungs every 6 (six) hours as needed for wheezing.   albuterol (2.5 MG/3ML) 0.083% nebulizer solution Commonly known as:  PROVENTIL Use 1 vial in nebulizer every 1 hours as needed for wheezing   Azelastine HCl 0.15 % Soln 1-2 sprays in each nostril 1-2 times daily as needed.   cetirizine 10 MG tablet Commonly known as:  ZYRTEC TAKE ONE TABLET BY MOUTH ONCE DAILY   clotrimazole 10 MG troche Commonly known as:  MYCELEX Slowly dissolve in the mouth 5 times per day for 10 days, not to be chewed or swallowed whole.   desloratadine 5 MG tablet Commonly known as:  CLARINEX Take 5 mg by mouth daily.   diphenhydrAMINE 25 mg capsule Commonly known as:  BENADRYL Take 1 capsule (25 mg total) by mouth every 12 (twelve) hours as needed for itching or allergies.   EPIPEN 2-PAK 0.3 mg/0.3 mL Soaj injection Generic drug:  EPINEPHrine Inject into the muscle once.   gabapentin 300 MG capsule Commonly known as:  NEURONTIN Take 1 capsule (300 mg total) by mouth 2 (two) times daily.   hydrocortisone cream 1 % Apply to affected area 2-4 times daily as needed   ipratropium 0.02 % nebulizer solution Commonly known as:  ATROVENT INHALE ONE VIAL BY NEBULIZER EVERY 6 HOURS AS NEEDED   meloxicam 7.5 MG tablet Commonly known as:  MOBIC Take 1 tablet (7.5 mg total) by mouth daily.   mometasone-formoterol 200-5 MCG/ACT Aero Commonly known as:  DULERA INHALE TWO PUFFS BY MOUTH TWICE DAILY TO PREVENT COUGH OR WHEEZE. RINSE GARGLE AND SPIT AFTER   pantoprazole 40 MG tablet Commonly known as:  PROTONIX Take 1 tablet (40 mg total) by mouth daily.   terbinafine 1 % cream Commonly known as:  LAMISIL Apply 1 application topically 2 (two) times daily.       Allergies  Allergen  Reactions  . Bee Venom Anaphylaxis  . Aspirin Nausea Only   Review of systems: Review of systems negative except as noted in HPI / PMHx or noted below: Constitutional: Negative.  HENT: Negative.   Eyes: Negative.  Respiratory: Negative.   Cardiovascular: Negative.  Gastrointestinal: Negative.  Genitourinary: Negative.  Musculoskeletal: Negative.  Neurological: Negative.  Endo/Heme/Allergies: Negative.  Cutaneous: Negative.  Past Medical History:  Diagnosis Date  . 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 Carpal tunnel syndrome 06/23/2011   Patient notes history of bilateral carpal tunnel syndrome.  Now has symptoms of right hand carpal tunnel.   [name] COPD (chronic obstructive pulmonary disease) ([place])    secondary to tobacco use // No PFTs on file  . ETOH abuse    Pt stopped in 3/08  . Gastrointestinal hemorrhage    secondary to PUD on March 2008  . OSA on CPAP    noncompliant with CPAP (2/2 it being depressing)  . Peptic ulcer disease    +H. pylori (antigen)- Dr. Olevia Perches- treated  . STRESS INCONTINENCE 01/24/2007  . Tobacco abuse    quit in 2010  . Urinary incontinence     Family History  Problem Relation Age of Onset  . Diabetes Sister   . Breast  cancer Sister   . Breast cancer Sister 98    died 2/2 breast ca age 73-60  . Diabetes Mother   . Coronary artery disease Mother 50    requiring 5 vessel CABG    Social History   Social History  . Marital status: Single    Spouse name: N/A  . Number of children: 3  . Years of education: 12th grade   Occupational History  . Disability     previously worked in [employer] had to stop 2/2 occupational exposures leading to exacerbations of asthma    Social History Main Topics  . Smoking status: Former Smoker    Packs/day: 0.40    Types: Cigarettes    Quit date: 12/04/2010  . Smokeless tobacco: Never Used     Comment: stopped 3 weeks ago  . Alcohol use 0.0 oz/week     Comment: now drinking a beer on weekends //   . Drug use: No  .  Sexual activity: Not on file   Other Topics Concern  . Not on file   Social History Narrative   Pt is not working now, was previously a Scientist, water quality at [employer].   Pt lives with her mother      Pt has received financial assistance approval for 100% discount at [facility] and has [facility] card.     I appreciate the opportunity to take part in Maria Nelson's care. Please do not hesitate to contact me with questions.  Sincerely,   R. Edgar Frisk, MD

## 2016-08-16 NOTE — Assessment & Plan Note (Signed)
Maria Nelson has complaints of a skin lesion over her left eye. She reports that initially it was a small spot like the end of a ballpoint pen. Says it has grown over the last year and is concerning to her. Denies any pain or tenderness over the area.   Plan: Concerning for cancerous lesion given the growth and central ulceration. Borders are clean with no irregularities though. Would like to obtain biopsy. However, given the area patient is concerned for scarring. Will refer to dermatology for evaluation and biopsy.

## 2016-08-18 NOTE — Progress Notes (Signed)
Internal Medicine Clinic Attending  Case discussed with Dr. Boswell at the time of the visit.  We reviewed the resident's history and exam and pertinent patient test results.  I agree with the assessment, diagnosis, and plan of care documented in the resident's note.  

## 2016-08-25 ENCOUNTER — Encounter: Payer: Self-pay | Admitting: *Deleted

## 2016-09-08 ENCOUNTER — Ambulatory Visit (INDEPENDENT_AMBULATORY_CARE_PROVIDER_SITE_OTHER): Payer: Medicaid Other | Admitting: Internal Medicine

## 2016-09-08 ENCOUNTER — Encounter: Payer: Self-pay | Admitting: Internal Medicine

## 2016-09-08 ENCOUNTER — Other Ambulatory Visit (HOSPITAL_COMMUNITY)
Admission: RE | Admit: 2016-09-08 | Discharge: 2016-09-08 | Disposition: A | Discharge status: Home / Self Care | Payer: Medicaid Other | Source: Ambulatory Visit | Attending: Internal Medicine | Admitting: Internal Medicine

## 2016-09-08 VITALS — BP 113/60 | HR 64 | Temp 98.1°F | Wt 248.7 lb

## 2016-09-08 DIAGNOSIS — Z01411 Encounter for gynecological examination (general) (routine) with abnormal findings: Secondary | ICD-10-CM | POA: Diagnosis not present

## 2016-09-08 DIAGNOSIS — Z0000 Encounter for general adult medical examination without abnormal findings: Secondary | ICD-10-CM | POA: Insufficient documentation

## 2016-09-08 DIAGNOSIS — M25562 Pain in left knee: Secondary | ICD-10-CM | POA: Diagnosis not present

## 2016-09-08 DIAGNOSIS — Z1151 Encounter for screening for human papillomavirus (HPV): Secondary | ICD-10-CM

## 2016-09-08 DIAGNOSIS — L989 Disorder of the skin and subcutaneous tissue, unspecified: Secondary | ICD-10-CM | POA: Diagnosis not present

## 2016-09-08 MED ORDER — DICLOFENAC SODIUM 1 % TD GEL: 2.0000 g | Freq: Four times a day (QID) | TRANSDERMAL | 2 refills | Status: DC

## 2016-09-08 NOTE — Progress Notes (Signed)
   CC: needs PAP smear  HPI:  [name] is a 60 y.o. female with a past medical history listed below here today for a PAP smear.   For details of today's visit and the status of her chronic medical issues please refer to the assessment and plan.   Past Medical History:  Diagnosis Date  . 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 Carpal tunnel syndrome 06/23/2011   Patient notes history of bilateral carpal tunnel syndrome.  Now has symptoms of right hand carpal tunnel.   [name] COPD (chronic obstructive pulmonary disease) ([place])    secondary to tobacco use // No PFTs on file  . ETOH abuse    Pt stopped in 3/08  . Gastrointestinal hemorrhage    secondary to PUD on March 2008  . OSA on CPAP    noncompliant with CPAP (2/2 it being depressing)  . Peptic ulcer disease    +H. pylori (antigen)- Dr. Olevia Perches- treated  . STRESS INCONTINENCE 01/24/2007  . Tobacco abuse    quit in 2010  . Urinary incontinence     Review of Systems:   No chest pain or shortness of breath   Physical Exam:  Vitals:   09/08/16 1549  BP: 113/60  Pulse: 64  Temp: 98.1 F (36.7 C)  TempSrc: Oral  Weight: 248 lb 11.2 oz (112.8 kg)   Physical Exam  Constitutional: She is oriented to person, place, and time and well-developed, well-nourished, and in no distress.  Cardiovascular: Normal rate and regular rhythm.   Pulmonary/Chest: Effort normal and breath sounds normal.  Genitourinary: Vagina normal and cervix normal. No vaginal discharge found.  Neurological: She is alert and oriented to person, place, and time.  Skin: Skin is warm and dry.  Psychiatric: Mood and affect normal.  Vitals reviewed.   Assessment & Plan:   See Encounters Tab for problem based charting.  Patient discussed with Dr. Daryll Drown

## 2016-09-08 NOTE — Patient Instructions (Addendum)
Maria Nelson,  We are getting your MRI scheduled. I will let you know about the results.  Please schedule follow up with me in 6 months.

## 2016-09-10 LAB — CYTOLOGY - PAP
DIAGNOSIS: NEGATIVE
HPV 16/18/45 GENOTYPING: NEGATIVE
HPV: DETECTED — AB

## 2016-09-13 NOTE — Assessment & Plan Note (Signed)
Has followed up with derm and has lesion removed. Reports they told her it was a cyst and nothing to worry about.   PLAN: Will attempt to get records from derm

## 2016-09-13 NOTE — Assessment & Plan Note (Addendum)
PAP smear done today.  Normal PAP pathology but HPV positive. HPV DNA testing negative for HPV 16 & 18/45. Will need repeating co-testing in 1 year.

## 2016-09-13 NOTE — Assessment & Plan Note (Signed)
No change in symptoms. Has not gotten MRI done yet, waiting on insurance.   PLAN: Voltaren gel F/U MRI

## 2016-09-15 NOTE — Progress Notes (Signed)
Internal Medicine Clinic Attending  Case discussed with Dr. Boswell at the time of the visit.  We reviewed the resident's history and exam and pertinent patient test results.  I agree with the assessment, diagnosis, and plan of care documented in the resident's note.  

## 2016-09-22 ENCOUNTER — Encounter: Payer: Medicaid Other | Admitting: Internal Medicine

## 2016-10-01 ENCOUNTER — Emergency Department (HOSPITAL_COMMUNITY): Payer: Medicaid Other

## 2016-10-01 ENCOUNTER — Encounter (HOSPITAL_COMMUNITY): Payer: Self-pay | Admitting: Emergency Medicine

## 2016-10-01 ENCOUNTER — Emergency Department (HOSPITAL_COMMUNITY)
Admission: EM | Admit: 2016-10-01 | Discharge: 2016-10-01 | Disposition: A | Discharge status: Home / Self Care | Payer: Medicaid Other | Attending: Emergency Medicine | Admitting: Emergency Medicine

## 2016-10-01 DIAGNOSIS — R51 Headache: Secondary | ICD-10-CM | POA: Diagnosis not present

## 2016-10-01 DIAGNOSIS — R079 Chest pain, unspecified: Secondary | ICD-10-CM

## 2016-10-01 DIAGNOSIS — Z79899 Other long term (current) drug therapy: Secondary | ICD-10-CM | POA: Diagnosis not present

## 2016-10-01 DIAGNOSIS — J449 Chronic obstructive pulmonary disease, unspecified: Secondary | ICD-10-CM | POA: Insufficient documentation

## 2016-10-01 DIAGNOSIS — B3789 Other sites of candidiasis: Secondary | ICD-10-CM | POA: Diagnosis not present

## 2016-10-01 DIAGNOSIS — Z87891 Personal history of nicotine dependence: Secondary | ICD-10-CM | POA: Insufficient documentation

## 2016-10-01 DIAGNOSIS — R0789 Other chest pain: Secondary | ICD-10-CM | POA: Insufficient documentation

## 2016-10-01 LAB — BASIC METABOLIC PANEL
Anion gap: 7 (ref 5–15)
BUN: 11 mg/dL (ref 6–20)
CALCIUM: 9.1 mg/dL (ref 8.9–10.3)
CO2: 24 mmol/L (ref 22–32)
CREATININE: 1.12 mg/dL — AB (ref 0.44–1.00)
Chloride: 104 mmol/L (ref 101–111)
GFR calc non Af Amer: 52 mL/min — ABNORMAL LOW (ref >60)
Glucose, Bld: 106 mg/dL — ABNORMAL HIGH (ref 65–99)
Potassium: 3.9 mmol/L (ref 3.5–5.1)
SODIUM: 135 mmol/L (ref 135–145)

## 2016-10-01 LAB — CBC
HCT: 39.6 % (ref 36.0–46.0)
Hemoglobin: 13.1 g/dL (ref 12.0–15.0)
MCH: 25.8 pg — AB (ref 26.0–34.0)
MCHC: 33.1 g/dL (ref 30.0–36.0)
MCV: 78 fL (ref 78.0–100.0)
Platelets: 241 10*3/uL (ref 150–400)
RBC: 5.08 MIL/uL (ref 3.87–5.11)
RDW: 13.9 % (ref 11.5–15.5)
WBC: 13.6 10*3/uL — ABNORMAL HIGH (ref 4.0–10.5)

## 2016-10-01 LAB — I-STAT TROPONIN, ED
TROPONIN I, POC: 0.01 ng/mL (ref 0.00–0.08)
TROPONIN I, POC: 0.02 ng/mL (ref 0.00–0.08)

## 2016-10-01 MED ORDER — NYSTATIN 100000 UNIT/GM EX CREA: TOPICAL_CREAM | CUTANEOUS | 1 refills | Status: DC

## 2016-10-01 MED ORDER — GI COCKTAIL ~~LOC~~
30.0000 mL | Freq: Once | ORAL | Status: AC
  Administered 2016-10-01: 30 mL via ORAL
  Filled 2016-10-01: qty 30

## 2016-10-01 NOTE — ED Triage Notes (Signed)
Pt reports sharp chest pain around left breast- very tender to palpation. Non radiating. EMS gave nitro and aspirin and ODT zofran. Also reporting leg pain.

## 2016-10-01 NOTE — ED Notes (Signed)
Pt reports relief from pain with ice pack.

## 2016-10-01 NOTE — Discharge Instructions (Signed)
Please keep the area under your breasts clean and dry.  I suggest wearing a bra with wash cloths in the bra and changing them out every few hours.  Ice packs may help the pain.    Please follow up with your PCP as soon as possible.  If your symptoms worsen please return to the ED for repeat evaluation.

## 2016-10-01 NOTE — ED Provider Notes (Signed)
[facility] DEPT Provider Note   CSN: [id number] Arrival date & time: 10/01/16  1041     History   Chief Complaint Chief Complaint  Patient presents with  . Chest Pain    HPI Maria Nelson is a 61 y.o. female with a history of OSA, COPD, Angioedema, Obesity, PUD who presents with sharp/burning left breast/chest pain that started yesterday afternoon.  PTA she was given zofran 4mg , asa 324 and NTG.  She reports that her pain is non radiating, increases with deep breathing or palpation of left breast.  Last night she took 5mg  hydrocodone which helped her pain.  Additionally today she is reporting a headache in the back of her head, states that when she woke up this morning she felt like she was "floating" and her headache has been present since before she was given nitroglycerin. Additionally she complains of left knee pain, this is chronic and unchanged from her normal. No fevers at home is nauseous without vomiting. She is not belching more than normal.  No changes to food.    Denies any recent activity changes, stating she still sleeping on the same amount of pillows. She denies any trauma.  No abdominal pain, vomiting or diarrhea.   HPI  Past Medical History:  Diagnosis Date  . 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 Carpal tunnel syndrome 06/23/2011   Patient notes history of bilateral carpal tunnel syndrome.  Now has symptoms of right hand carpal tunnel.   [name] COPD (chronic obstructive pulmonary disease) ([place])    secondary to tobacco use // No PFTs on file  . ETOH abuse    Pt stopped in 3/08  . Gastrointestinal hemorrhage    secondary to PUD on March 2008  . OSA on CPAP    noncompliant with CPAP (2/2 it being depressing)  . Peptic ulcer disease    +H. pylori (antigen)- Dr. Olevia Perches- treated  . STRESS INCONTINENCE 01/24/2007  . Tobacco abuse    quit in 2010  . Urinary incontinence     Patient Active Problem List   Diagnosis Date Noted  . Knee pain, left 08/16/2016  . Skin lesion of  face 08/16/2016  . Back pain 06/23/2016  . Urticaria 07/01/2015  . 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 Allergic rhinitis with nonallergic component 03/04/2015  . Allergic rhinitis 08/16/2014  . COPD (chronic obstructive pulmonary disease) ([place]) 06/19/2014  . Blood pressure elevated without history of HTN 03/05/2014  . HNP (herniated nucleus pulposus), lumbar 04/16/2013  . Tenosynovitis, de Quervain 11/15/2012  . Morbid obesity with BMI of 45.0-49.9, adult ([place]) 09/08/2012  . Carpal tunnel syndrome 06/23/2011  . Sleep apnea 09/02/2010  . Preventative health care 07/28/2010  . GERD 12/24/2009  . Angioedema/urticaria 12/16/2009  . STRESS INCONTINENCE 01/24/2007  . PEPTIC ULCER DISEASE 07/26/2006    Past Surgical History:  Procedure Laterality Date  . BREAST EXCISIONAL BIOPSY Right 1994  . MANDIBLE SURGERY     due to trauma  . TUBAL LIGATION      OB History    No data available       Home Medications    Prior to Admission medications   Medication Sig Start Date End Date Taking? Authorizing Provider  albuterol (PROVENTIL HFA;VENTOLIN HFA) 108 (90 BASE) MCG/ACT inhaler Inhale 2 puffs into the lungs every 6 (six) hours as needed for wheezing. 03/05/14  Yes [name], MD  albuterol (PROVENTIL) (2.5 MG/3ML) 0.083% nebulizer solution Use 1 vial in nebulizer every 1 hours as needed for wheezing 07/30/14  Yes [name], MD  cetirizine (ZYRTEC) 10 MG tablet TAKE ONE TABLET BY MOUTH ONCE DAILY 08/11/16  Yes Maryellen Pile, MD  diclofenac sodium (VOLTAREN) 1 % GEL Apply 2 g topically 4 (four) times daily. 09/08/16  Yes Maryellen Pile, MD  EPINEPHrine (EPIPEN 2-PAK) 0.3 mg/0.3 mL IJ SOAJ injection Inject into the muscle once.   Yes [provider]  ipratropium (ATROVENT) 0.02 % nebulizer solution INHALE ONE VIAL BY NEBULIZER EVERY 6 HOURS AS NEEDED 07/01/15  Yes Bobbitt, Sedalia Muta, MD  mometasone-formoterol (DULERA) 200-5 MCG/ACT AERO INHALE TWO PUFFS BY MOUTH TWICE DAILY  TO PREVENT COUGH OR WHEEZE. RINSE GARGLE AND SPIT AFTER 08/16/16  Yes Bobbitt, Sedalia Muta, MD  pantoprazole (PROTONIX) 40 MG tablet Take 1 tablet (40 mg total) by mouth daily. 12/01/15  Yes Maryellen Pile, MD  Azelastine HCl 0.15 % SOLN 1-2 sprays in each nostril 1-2 times daily as needed. Patient not taking: Reported on 10/01/2016 07/01/15   Bobbitt, Sedalia Muta, MD  clotrimazole [facility]) 10 MG troche Slowly dissolve in the mouth 5 times per day for 10 days, not to be chewed or swallowed whole. 07/01/15   Bobbitt, Sedalia Muta, MD  diphenhydrAMINE (BENADRYL) 25 mg capsule Take 1 capsule (25 mg total) by mouth every 12 (twelve) hours as needed for itching or allergies. Patient not taking: Reported on 10/01/2016 02/11/16   Maryellen Pile, MD  gabapentin (NEURONTIN) 300 MG capsule Take 1 capsule (300 mg total) by mouth 2 (two) times daily. Patient not taking: Reported on 10/01/2016 09/10/15 09/09/16  Maryellen Pile, MD  hydrocortisone cream 1 % Apply to affected area 2-4 times daily as needed Patient not taking: Reported on 10/01/2016 12/01/15 11/30/16  Maryellen Pile, MD  meloxicam (MOBIC) 7.5 MG tablet Take 1 tablet (7.5 mg total) by mouth daily. Patient not taking: Reported on 10/01/2016 06/23/16 06/23/17  Alphonzo Grieve, MD  nystatin cream (MYCOSTATIN) Apply to affected area 2 times daily 10/01/16   Fredia Sorrow, MD  terbinafine (LAMISIL) 1 % cream Apply 1 application topically 2 (two) times daily. Patient not taking: Reported on 10/01/2016 11/27/14   Maryellen Pile, MD    Family History Family History  Problem Relation Age of Onset  . Diabetes Sister   . Breast cancer Sister   . Breast cancer Sister 84       died 2/2 breast ca age 61-60  . Diabetes Mother   . Coronary artery disease Mother 40       requiring 5 vessel CABG    Social History Social History  Substance Use Topics  . Smoking status: Former Smoker    Packs/day: 0.40    Types: Cigarettes    Quit date: 12/04/2010  .  Smokeless tobacco: Never Used     Comment: stopped 3 weeks ago  . Alcohol use 0.0 oz/week     Comment: now drinking a beer on weekends //      Allergies   Bee venom and Aspirin   Review of Systems Review of Systems  Constitutional: Negative for appetite change, fatigue and fever.  HENT: Negative for congestion, ear pain and sore throat.   Eyes: Negative for photophobia and visual disturbance.  Respiratory: Positive for cough (Not more than normal) and shortness of breath (Not more than normal.). Negative for chest tightness.   Cardiovascular: Positive for chest pain. Negative for palpitations and leg swelling.  Gastrointestinal: Positive for nausea. Negative for abdominal pain, constipation, diarrhea and vomiting.  Genitourinary: Negative for decreased urine volume, dysuria and  hematuria.  Musculoskeletal: Positive for arthralgias (Not changed from her normal) and myalgias (Not changed from normal).  Skin: Negative for color change and wound.  Neurological: Positive for headaches ("felt like she was floating this morning"). Negative for dizziness, syncope, facial asymmetry, weakness and light-headedness.     Physical Exam Updated Vital Signs BP 119/85   Pulse 72   Temp 98.9 F (37.2 C) (Oral)   Resp 15   SpO2 94%   Physical Exam  Constitutional: She is oriented to person, place, and time. She appears well-developed and well-nourished.  Non-toxic appearance. No distress.  HENT:  Head: Normocephalic and atraumatic.  Right Ear: External ear normal.  Left Ear: External ear normal.  Nose: Nose normal.  Mouth/Throat: Oropharynx is clear and moist.  Eyes: Conjunctivae and EOM are normal. Pupils are equal, round, and reactive to light. No scleral icterus.  Neck: Normal range of motion. Neck supple. No tracheal deviation present.  Cardiovascular: Normal rate, regular rhythm, normal heart sounds and intact distal pulses.   No murmur heard. Pulmonary/Chest: Effort normal and  breath sounds normal. No stridor. No respiratory distress. She has no wheezes. She exhibits tenderness. Right breast exhibits no skin change and no tenderness. Left breast exhibits skin change and tenderness. Left breast exhibits no inverted nipple and no nipple discharge.  Patient with pendulous breasts bilaterally. No obvious redness to painful area under left breast.  Area is warm  With redness around areola  Consistent with excoriation.   Area of maximum tenderness is in the skin fold where inferior breast joins chest.    Abdominal: Soft. Bowel sounds are normal. She exhibits no distension. There is no tenderness. There is no guarding.  Musculoskeletal: She exhibits no edema or deformity.  Neurological: She is alert and oriented to person, place, and time. She displays no atrophy and no tremor. No cranial nerve deficit or sensory deficit. She exhibits normal muscle tone.  Strength 4/5 in all extremities.  Patient does not feel more weak than normal, says her pain limits her strength  CN III-XII tested and intact  Skin: Skin is warm and dry. She is not diaphoretic. No pallor.   Patient has red, tender, scaly area under her left breast.  The area is TTP which recreates and exacerbates her reported chest pain   Psychiatric: She has a normal mood and affect. Her behavior is normal.  Nursing note and vitals reviewed.    ED Treatments / Results  Labs (all labs ordered are listed, but only abnormal results are displayed) Labs Reviewed  BASIC METABOLIC PANEL - Abnormal; Notable for the following:       Result Value   Glucose, Bld 106 (*)    Creatinine, Ser 1.12 (*)    GFR calc non Af Amer 52 (*)    All other components within normal limits  CBC - Abnormal; Notable for the following:    WBC 13.6 (*)    MCH 25.8 (*)    All other components within normal limits  I-STAT TROPOININ, ED  I-STAT TROPOININ, ED    EKG  EKG Interpretation None       Radiology Dg Chest 2 View  Result Date:  10/01/2016 CLINICAL DATA:  Chest pain EXAM: CHEST  2 VIEW COMPARISON:  May 25, 2016. FINDINGS: There is no edema or consolidation. The heart is upper normal in size with pulmonary vascularity within normal limits. No adenopathy. There is degenerative change in the thoracic spine. IMPRESSION: No edema or consolidation. Electronically Signed  By: Lowella Grip III M.D.   On: 10/01/2016 12:27   Ct Head Wo Contrast  Result Date: 10/01/2016 CLINICAL DATA:  Occipital headache. Patient came to ER with chest pain. EXAM: CT HEAD WITHOUT CONTRAST TECHNIQUE: Contiguous axial images were obtained from the base of the skull through the vertex without intravenous contrast. COMPARISON:  None available FINDINGS: Brain: No evidence of acute infarction, hemorrhage, hydrocephalus, extra-axial collection or mass lesion/mass effect. Partially empty sella appearance, nonspecific. Mild, symmetric parietal occipital sulcus widening. Vascular: Vascular calcification.  No hyperdense vessel. Skull: Negative Sinuses/Orbits: Previous left orbital floor and medial wall blowout fractures. There has been repair of the left orbital rim inferiorly and laterally. 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 IMPRESSION: No acute finding to explain headache. Electronically Signed   By: Monte Fantasia M.D.   On: 10/01/2016 14:58    Procedures Procedures (including critical care time)  Medications Ordered in ED Medications  gi cocktail (Maalox,Lidocaine,Donnatal) (30 mLs Oral Given 10/01/16 1231)     Initial Impression / Assessment and Plan / ED Course  I have reviewed the triage vital signs and the nursing notes.  Pertinent labs & imaging results that were available during my care of the patient were reviewed by me and considered in my medical decision making (see chart for details).  Clinical Course as of Oct 01 1644  Fri Oct 01, 2016  1136 Rechecked PT based on recorded BP.  Moved cuff from wrist to arm and got  reading of 103/84.  Suspect 91/41 is false reading.   [EH]  1300 Patient reported GI cocktail did not help, Ordered ice pack for pain under her breast.   [EH]    Clinical Course User Index [EH] Lorin Glass, PA-C   Patient is to be discharged with recommendation to follow up with PCP in regards to today's hospital visit. Chest pain is not likely of cardiac or pulmonary etiology d/t presentation, VSS, no tracheal deviation, no JVD or new murmur, RRR, breath sounds equal bilaterally, EKG without acute abnormalities, negative troponin, and negative CXR. Patients pain is re-created with light palpation of skin between chest and breast where skin is macerated, red and scaley, alleviated with ice pack.  Suspect pain is from candida infection/skin maceration under breast.  Will d/c with nystatin.    Patient headache was reported as present before NTG, based on age CT head was obtained with out acute abnormalities. Normal neuro exam, low concern for intracranial process.    Pt has been advised to return to the ED if CP becomes exertional, associated with diaphoresis or nausea, radiates to left jaw/arm, worsens or becomes concerning in any way. Pt appears reliable for follow up and is agreeable to discharge.   Case has been discussed with and seen by Dr. Rogene Houston who agrees with the above plan to discharge.     Final Clinical Impressions(s) / ED Diagnoses   Final diagnoses:  Chest pain, unspecified type  Chest wall pain  Candidiasis of breast    New Prescriptions New Prescriptions   NYSTATIN CREAM (MYCOSTATIN)    Apply to affected area 2 times daily     Ollen Gross 10/01/16 1647    Fredia Sorrow, MD 10/13/16 [phone]

## 2016-10-01 NOTE — ED Notes (Signed)
Pt verbalized understanding of discharge instructions.

## 2016-12-27 ENCOUNTER — Other Ambulatory Visit: Payer: Self-pay | Admitting: Internal Medicine

## 2016-12-29 ENCOUNTER — Other Ambulatory Visit: Payer: Self-pay | Admitting: *Deleted

## 2016-12-29 MED ORDER — PANTOPRAZOLE SODIUM 40 MG PO TBEC: 40.0000 mg | DELAYED_RELEASE_TABLET | Freq: Every day | ORAL | 1 refills | Status: DC

## 2016-12-29 NOTE — Telephone Encounter (Signed)
Received fax from pharmacy stating they are unable to fill protonix rx because there is a problem with prre scribe's NPI number.  I will have to submit new rx under attending name for completion.Regenia Skeeter, Demetrica Zipp Cassady9/12/201810:08 AM

## 2017-01-10 ENCOUNTER — Ambulatory Visit (INDEPENDENT_AMBULATORY_CARE_PROVIDER_SITE_OTHER): Payer: Medicaid Other | Admitting: Allergy and Immunology

## 2017-01-10 ENCOUNTER — Encounter: Payer: Self-pay | Admitting: Allergy and Immunology

## 2017-01-10 VITALS — BP 130/80 | HR 78 | Temp 98.1°F | Resp 18

## 2017-01-10 DIAGNOSIS — T783XXD Angioneurotic edema, subsequent encounter: Secondary | ICD-10-CM

## 2017-01-10 DIAGNOSIS — L501 Idiopathic urticaria: Secondary | ICD-10-CM | POA: Diagnosis not present

## 2017-01-10 DIAGNOSIS — J449 Chronic obstructive pulmonary disease, unspecified: Secondary | ICD-10-CM

## 2017-01-10 DIAGNOSIS — J3089 Other allergic rhinitis: Secondary | ICD-10-CM

## 2017-01-10 MED ORDER — MOMETASONE FURO-FORMOTEROL FUM 200-5 MCG/ACT IN AERO: INHALATION_SPRAY | RESPIRATORY_TRACT | 5 refills | Status: DC

## 2017-01-10 MED ORDER — FEXOFENADINE HCL 60 MG PO TABS: 60.0000 mg | ORAL_TABLET | Freq: Two times a day (BID) | ORAL | 2 refills | Status: DC | PRN

## 2017-01-10 NOTE — Assessment & Plan Note (Signed)
Stable.  Continue appropriate allergen avoidance measures, azelastine nasal spray as needed, and nasal saline irrigation as needed.  A prescription has been provided for fexofenadine (Allegra) 60 mg 1-2 times daily as needed.

## 2017-01-10 NOTE — Assessment & Plan Note (Signed)
Current well-controlled.  Continue Dulera 200-5 g, 2 inhalations via spacer device twice a day and albuterol HFA, 1-2 inhalations every 4-6 hours as needed and 15 minutes prior to moderate/vigorous physical exertion.  Subjective and objective measures of pulmonary function will be followed and the treatment plan will be adjusted accordingly.

## 2017-01-10 NOTE — Assessment & Plan Note (Signed)
Idiopathic angioedema/urticaria.  Lab work has been unrevealing.  Fexofenadine (Allegra) 60 mg 1-2 times daily as needed.  Should significant symptoms recur or new symptoms occur, a journal is to be kept recording any foods eaten, beverages consumed, medications taken, activities performed, and environmental conditions within a 6 hour time period prior to the onset of symptoms. For any symptoms concerning for anaphylaxis, epinephrine is to be administered and 911 is to be called immediately.

## 2017-01-10 NOTE — Assessment & Plan Note (Signed)
>>  ASSESSMENT AND PLAN FOR URTICARIA/ANGIOEDEMA WRITTEN ON 01/10/2017  6:25 PM BY BOBBITT, Heywood Iles, MD  Idiopathic angioedema/urticaria.  Lab work has been unrevealing. Fexofenadine (Allegra) 60 mg 1-2 times daily as needed. Should significant symptoms recur or new symptoms occur, a journal is to be kept recording any foods eaten, beverages consumed, medications taken, activities performed, and environmental conditions within a 6 hour time period prior to the onset of symptoms. For any symptoms concerning for anaphylaxis, epinephrine is to be administered and 911 is to be called immediately.

## 2017-01-10 NOTE — Patient Instructions (Signed)
Asthma with COPD Current well-controlled.  Continue Dulera 200-5 g, 2 inhalations via spacer device twice a day and albuterol HFA, 1-2 inhalations every 4-6 hours as needed and 15 minutes prior to moderate/vigorous physical exertion.  Subjective and objective measures of pulmonary function will be followed and the treatment plan will be adjusted accordingly.  Allergic rhinitis with nonallergic component Stable.  Continue appropriate allergen avoidance measures, azelastine nasal spray as needed, and nasal saline irrigation as needed.  A prescription has been provided for fexofenadine (Allegra) 60 mg 1-2 times daily as needed.  Angioedema/urticaria Idiopathic angioedema/urticaria.  Lab work has been unrevealing.  Fexofenadine (Allegra) 60 mg 1-2 times daily as needed.  Should significant symptoms recur or new symptoms occur, a journal is to be kept recording any foods eaten, beverages consumed, medications taken, activities performed, and environmental conditions within a 6 hour time period prior to the onset of symptoms. For any symptoms concerning for anaphylaxis, epinephrine is to be administered and 911 is to be called immediately.    Return in about 4 months (around 05/12/2017), or if symptoms worsen or fail to improve.

## 2017-01-10 NOTE — Progress Notes (Signed)
Follow-up Note  RE: Maria Nelson MRN: [id number] DOB: [birth year] Date of Office Visit: 01/10/2017  Primary care provider: Maryellen Pile, MD Referring provider: Maryellen Pile, MD  History of present illness: Maria Nelson is a 61 y.o. female with angioedema/urticaria, asthma/COPD, and allergic rhinitis presenting today for follow up.  She was last seen in this clinic on August 16, 2016.  She reports one episode of urticaria and associated lip/tongue angioedema which occurred in August.  She notes that she had been off of all antihistamines for a few weeks at that time.  Previous laboratory evaluation has been negative.  She reports that overall her asthma has been well-controlled with Dulera 200/5 g, 2 inhalations via spacer device twice a day.  On average, she requires albuterol rescue fewer than 1 time per month and denies nocturnal awakenings due to lower respiratory symptoms.  She has no nasal symptom complaints today.  At last visit some tongue/lip tingling.  Tongue nad lips swelling in August but had been off of all antihistamines. Also with hives on her neck and chest.   dulera 200, 2/2 with spacer. Some SOB with activity - alb fewer than 1 x per motnh. No noct sx.   coughin beneits from hot tea.   No nasal compliants.    Not using any nasal spray or saline.   Rx: fexodine Tachy  Mild edema.  Throat with mild cobblestones,  cta   Assessment and plan: Asthma with COPD Current well-controlled.  Continue Dulera 200-5 g, 2 inhalations via spacer device twice a day and albuterol HFA, 1-2 inhalations every 4-6 hours as needed and 15 minutes prior to moderate/vigorous physical exertion.  Subjective and objective measures of pulmonary function will be followed and the treatment plan will be adjusted accordingly.  Allergic rhinitis with nonallergic component Stable.  Continue appropriate allergen avoidance measures, azelastine nasal spray as needed, and  nasal saline irrigation as needed.  A prescription has been provided for fexofenadine (Allegra) 60 mg 1-2 times daily as needed.  Angioedema/urticaria Idiopathic angioedema/urticaria.  Lab work has been unrevealing.  Fexofenadine (Allegra) 60 mg 1-2 times daily as needed.  Should significant symptoms recur or new symptoms occur, a journal is to be kept recording any foods eaten, beverages consumed, medications taken, activities performed, and environmental conditions within a 6 hour time period prior to the onset of symptoms. For any symptoms concerning for anaphylaxis, epinephrine is to be administered and 911 is to be called immediately.    Meds ordered this encounter  Medications  . mometasone-formoterol (DULERA) 200-5 MCG/ACT AERO    Sig: INHALE TWO PUFFS BY MOUTH TWICE DAILY TO PREVENT COUGH OR WHEEZE. RINSE GARGLE AND SPIT AFTER    Dispense:  13 g    Refill:  5  . fexofenadine (ALLEGRA) 60 MG tablet    Sig: Take 1 tablet (60 mg total) by mouth 2 (two) times daily as needed for allergies or rhinitis.    Dispense:  120 tablet    Refill:  2    Diagnostics: Spirometry reveals an FVC of 1.59 L (70% predicted) and an FEV1 of 1.18 L (64% predicted) with an FEV1 ratio of 91%.  FEV1 and mild restrictive pattern consistent with previous study.  Please see scanned spirometry results for details.    Physical examination: Blood pressure 130/80, pulse 78, temperature 98.1 F (36.7 C), temperature source Oral, resp. rate 18, SpO2 93 %.  General: Alert, interactive, in no acute distress. HEENT: TMs pearly gray, turbinates mildly edematous without  discharge, post-pharynx moderately erythematous. Neck: Supple without lymphadenopathy. Lungs: Mildly decreased breath sounds bilaterally without wheezing, rhonchi or rales. CV: Normal S1, S2 without murmurs. Skin: Warm and dry, without lesions or rashes.  The following portions of the patient's history were reviewed and updated as appropriate:  allergies, current medications, past family history, past medical history, past social history, past surgical history and problem list.  Allergies as of 01/10/2017      Reactions   Bee Venom Anaphylaxis   Aspirin Nausea Only      Medication List       Accurate as of 01/10/17  6:26 PM. Always use your most recent med list.          albuterol 108 (90 Base) MCG/ACT inhaler Commonly known as:  PROVENTIL HFA;VENTOLIN HFA Inhale 2 puffs into the lungs every 6 (six) hours as needed for wheezing.   albuterol (2.5 MG/3ML) 0.083% nebulizer solution Commonly known as:  PROVENTIL Use 1 vial in nebulizer every 1 hours as needed for wheezing   Azelastine HCl 0.15 % Soln 1-2 sprays in each nostril 1-2 times daily as needed.   cetirizine 10 MG tablet Commonly known as:  ZYRTEC TAKE 1 TABLET BY MOUTH ONCE DAILY   clotrimazole 10 MG troche Commonly known as:  MYCELEX Slowly dissolve in the mouth 5 times per day for 10 days, not to be chewed or swallowed whole.   diclofenac sodium 1 % Gel Commonly known as:  VOLTAREN Apply 2 g topically 4 (four) times daily.   diphenhydrAMINE 25 mg capsule Commonly known as:  BENADRYL Take 1 capsule (25 mg total) by mouth every 12 (twelve) hours as needed for itching or allergies.   EPIPEN 2-PAK 0.3 mg/0.3 mL Soaj injection Generic drug:  EPINEPHrine Inject into the muscle once.   fexofenadine 60 MG tablet Commonly known as:  ALLEGRA Take 1 tablet (60 mg total) by mouth 2 (two) times daily as needed for allergies or rhinitis.   gabapentin 300 MG capsule Commonly known as:  NEURONTIN Take 1 capsule (300 mg total) by mouth 2 (two) times daily.   ipratropium 0.02 % nebulizer solution Commonly known as:  ATROVENT INHALE ONE VIAL BY NEBULIZER EVERY 6 HOURS AS NEEDED   meloxicam 7.5 MG tablet Commonly known as:  MOBIC Take 1 tablet (7.5 mg total) by mouth daily.   mometasone-formoterol 200-5 MCG/ACT Aero Commonly known as:  DULERA INHALE TWO  PUFFS BY MOUTH TWICE DAILY TO PREVENT COUGH OR WHEEZE. RINSE GARGLE AND SPIT AFTER   nystatin cream Commonly known as:  MYCOSTATIN Apply to affected area 2 times daily   pantoprazole 40 MG tablet Commonly known as:  PROTONIX Take 1 tablet (40 mg total) by mouth daily.   terbinafine 1 % cream Commonly known as:  LAMISIL Apply 1 application topically 2 (two) times daily.            Discharge Care Instructions        Start     Ordered   01/10/17 0000  Spirometry with Graph    Question Answer Comment  Where should this test be performed? Other   Basic spirometry Yes   Spirometry pre & post bronchodilator No      01/10/17 1752   01/10/17 0000  mometasone-formoterol (DULERA) 200-5 MCG/ACT AERO     01/10/17 1752   01/10/17 0000  fexofenadine (ALLEGRA) 60 MG tablet  2 times daily PRN     01/10/17 1826      Allergies  Allergen Reactions  .  Bee Venom Anaphylaxis  . Aspirin Nausea Only   Review of systems: Review of systems negative except as noted in HPI / PMHx or noted below: Constitutional: Negative.  HENT: Negative.   Eyes: Negative.  Respiratory: Negative.   Cardiovascular: Negative.  Gastrointestinal: Negative.  Genitourinary: Negative.  Musculoskeletal: Negative.  Neurological: Negative.  Endo/Heme/Allergies: Negative.  Cutaneous: Negative.  Past Medical History:  Diagnosis Date  . 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 Carpal tunnel syndrome 06/23/2011   Patient notes history of bilateral carpal tunnel syndrome.  Now has symptoms of right hand carpal tunnel.   [name] COPD (chronic obstructive pulmonary disease) ([place])    secondary to tobacco use // No PFTs on file  . ETOH abuse    Pt stopped in 3/08  . Gastrointestinal hemorrhage    secondary to PUD on March 2008  . OSA on CPAP    noncompliant with CPAP (2/2 it being depressing)  . Peptic ulcer disease    +H. pylori (antigen)- Dr. Olevia Perches- treated  . STRESS INCONTINENCE 01/24/2007  . Tobacco abuse    quit in 2010  .  Urinary incontinence     Family History  Problem Relation Age of Onset  . Diabetes Sister   . Breast cancer Sister   . Breast cancer Sister 18       died 2/2 breast ca age 33-60  . Diabetes Mother   . Coronary artery disease Mother 78       requiring 5 vessel CABG    Social History   Social History  . Marital status: Single    Spouse name: N/A  . Number of children: 3  . Years of education: 12th grade   Occupational History  . Disability     previously worked in [employer] had to stop 2/2 occupational exposures leading to exacerbations of asthma    Social History Main Topics  . Smoking status: Former Smoker    Packs/day: 0.40    Types: Cigarettes    Quit date: 12/04/2010  . Smokeless tobacco: Never Used     Comment: stopped 3 weeks ago  . Alcohol use 0.0 oz/week     Comment: now drinking a beer on weekends //   . Drug use: No  . Sexual activity: Not on file   Other Topics Concern  . Not on file   Social History Narrative   Pt is not working now, was previously a Scientist, water quality at [employer].   Pt lives with her mother      Pt has received financial assistance approval for 100% discount at [facility] and has [facility] card.     I appreciate the opportunity to take part in Airyonna's care. Please do not hesitate to contact me with questions.  Sincerely,   R. Edgar Frisk, MD

## 2017-04-19 ENCOUNTER — Encounter (HOSPITAL_COMMUNITY): Payer: Self-pay

## 2017-04-19 ENCOUNTER — Emergency Department (HOSPITAL_COMMUNITY)
Admission: EM | Admit: 2017-04-19 | Discharge: 2017-04-19 | Disposition: A | Discharge status: Home / Self Care | Payer: Medicaid Other | Attending: Emergency Medicine | Admitting: Emergency Medicine

## 2017-04-19 ENCOUNTER — Other Ambulatory Visit: Payer: Self-pay

## 2017-04-19 DIAGNOSIS — L509 Urticaria, unspecified: Secondary | ICD-10-CM | POA: Diagnosis not present

## 2017-04-19 DIAGNOSIS — Z5321 Procedure and treatment not carried out due to patient leaving prior to being seen by health care provider: Secondary | ICD-10-CM | POA: Diagnosis not present

## 2017-04-19 NOTE — ED Triage Notes (Signed)
Pt presents for evaluation of hives today. Pt reports ate lasagna yesterday and has itching, generalized hives. Pt denies exposure to anything else new. Denies difficulty breathing, N/V. Pt reports has been taking benadryl, last dose 0200 this AM.

## 2017-04-19 NOTE — ED Notes (Signed)
Patient and family up to nurse first desk, stating that they are just going to go to [facility] ED. apologized for wait, encouraged to stay to be seen by EDP

## 2017-06-14 ENCOUNTER — Other Ambulatory Visit: Payer: Self-pay | Admitting: *Deleted

## 2017-06-15 MED ORDER — CETIRIZINE HCL 10 MG PO TABS: 10.0000 mg | ORAL_TABLET | Freq: Every day | ORAL | 2 refills | Status: DC

## 2017-07-06 ENCOUNTER — Other Ambulatory Visit: Payer: Self-pay | Admitting: Internal Medicine

## 2017-07-06 MED ORDER — ALBUTEROL SULFATE (2.5 MG/3ML) 0.083% IN NEBU: INHALATION_SOLUTION | RESPIRATORY_TRACT | 3 refills | Status: DC

## 2017-07-06 NOTE — Telephone Encounter (Signed)
Call made to patient as she is overdue for a visit.  Pt given 1st avail appt w/pcp on 08/17/2017 @2 :45pm.  When asked about MRI (knee), states she received letter in the mail where it was denied, she also states her knee is doing a little better.  Will readdress knee pain a next visit.Despina Hidden Cassady3/20/201910:19 AM

## 2017-07-08 ENCOUNTER — Other Ambulatory Visit: Payer: Self-pay | Admitting: Oncology

## 2017-07-08 ENCOUNTER — Other Ambulatory Visit: Payer: Self-pay | Admitting: Internal Medicine

## 2017-07-08 DIAGNOSIS — Z139 Encounter for screening, unspecified: Secondary | ICD-10-CM

## 2017-07-11 ENCOUNTER — Telehealth: Payer: Self-pay | Admitting: *Deleted

## 2017-07-11 MED ORDER — ALBUTEROL SULFATE (2.5 MG/3ML) 0.083% IN NEBU: INHALATION_SOLUTION | RESPIRATORY_TRACT | 3 refills | Status: DC

## 2017-07-11 NOTE — Telephone Encounter (Signed)
Fax from [facility]: ALBUTEROL 0.083% NEB (SENT 07/06/17) PLS VERIFY INSTRUCTIONS. WAS ORDERED EVERY 1 HOUR AS NEEDED.  Pls send new rx if appropriate. Thank you!

## 2017-08-15 ENCOUNTER — Ambulatory Visit
Admission: RE | Admit: 2017-08-15 | Discharge: 2017-08-15 | Disposition: A | Discharge status: Home / Self Care | Payer: Medicaid Other | Source: Ambulatory Visit | Attending: Oncology | Admitting: Oncology

## 2017-08-15 DIAGNOSIS — Z139 Encounter for screening, unspecified: Secondary | ICD-10-CM

## 2017-08-15 DIAGNOSIS — Z1231 Encounter for screening mammogram for malignant neoplasm of breast: Secondary | ICD-10-CM | POA: Diagnosis not present

## 2017-08-17 ENCOUNTER — Encounter: Payer: Medicaid Other | Admitting: Internal Medicine

## 2017-08-24 ENCOUNTER — Telehealth (HOSPITAL_COMMUNITY): Payer: Self-pay | Admitting: *Deleted

## 2017-08-24 ENCOUNTER — Other Ambulatory Visit: Payer: Self-pay | Admitting: Cardiology

## 2017-08-24 ENCOUNTER — Emergency Department (HOSPITAL_COMMUNITY): Payer: Medicaid Other

## 2017-08-24 ENCOUNTER — Inpatient Hospital Stay (HOSPITAL_COMMUNITY): Payer: Medicaid Other

## 2017-08-24 ENCOUNTER — Other Ambulatory Visit: Payer: Self-pay

## 2017-08-24 ENCOUNTER — Encounter (HOSPITAL_COMMUNITY): Payer: Self-pay

## 2017-08-24 ENCOUNTER — Inpatient Hospital Stay (HOSPITAL_COMMUNITY)
Admission: EM | Admit: 2017-08-24 | Discharge: 2017-08-24 | DRG: 309 | Disposition: A | Discharge status: Home / Self Care | Payer: Medicaid Other | Attending: Internal Medicine | Admitting: Internal Medicine

## 2017-08-24 DIAGNOSIS — J309 Allergic rhinitis, unspecified: Secondary | ICD-10-CM | POA: Diagnosis present

## 2017-08-24 DIAGNOSIS — Z7951 Long term (current) use of inhaled steroids: Secondary | ICD-10-CM | POA: Diagnosis not present

## 2017-08-24 DIAGNOSIS — Z91030 Bee allergy status: Secondary | ICD-10-CM | POA: Diagnosis not present

## 2017-08-24 DIAGNOSIS — I272 Pulmonary hypertension, unspecified: Secondary | ICD-10-CM | POA: Diagnosis present

## 2017-08-24 DIAGNOSIS — Z9119 Patient's noncompliance with other medical treatment and regimen: Secondary | ICD-10-CM

## 2017-08-24 DIAGNOSIS — J4489 Other specified chronic obstructive pulmonary disease: Secondary | ICD-10-CM | POA: Diagnosis present

## 2017-08-24 DIAGNOSIS — Z79899 Other long term (current) drug therapy: Secondary | ICD-10-CM | POA: Diagnosis not present

## 2017-08-24 DIAGNOSIS — Z8249 Family history of ischemic heart disease and other diseases of the circulatory system: Secondary | ICD-10-CM

## 2017-08-24 DIAGNOSIS — Z886 Allergy status to analgesic agent status: Secondary | ICD-10-CM

## 2017-08-24 DIAGNOSIS — I959 Hypotension, unspecified: Secondary | ICD-10-CM | POA: Diagnosis present

## 2017-08-24 DIAGNOSIS — Z7982 Long term (current) use of aspirin: Secondary | ICD-10-CM

## 2017-08-24 DIAGNOSIS — I361 Nonrheumatic tricuspid (valve) insufficiency: Secondary | ICD-10-CM

## 2017-08-24 DIAGNOSIS — Z803 Family history of malignant neoplasm of breast: Secondary | ICD-10-CM

## 2017-08-24 DIAGNOSIS — J9811 Atelectasis: Secondary | ICD-10-CM | POA: Diagnosis present

## 2017-08-24 DIAGNOSIS — R0789 Other chest pain: Secondary | ICD-10-CM | POA: Diagnosis not present

## 2017-08-24 DIAGNOSIS — R0602 Shortness of breath: Secondary | ICD-10-CM | POA: Diagnosis not present

## 2017-08-24 DIAGNOSIS — K2960 Other gastritis without bleeding: Secondary | ICD-10-CM | POA: Diagnosis present

## 2017-08-24 DIAGNOSIS — I480 Paroxysmal atrial fibrillation: Secondary | ICD-10-CM

## 2017-08-24 DIAGNOSIS — I4891 Unspecified atrial fibrillation: Secondary | ICD-10-CM

## 2017-08-24 DIAGNOSIS — T783XXA Angioneurotic edema, initial encounter: Secondary | ICD-10-CM

## 2017-08-24 DIAGNOSIS — R1013 Epigastric pain: Secondary | ICD-10-CM

## 2017-08-24 DIAGNOSIS — J449 Chronic obstructive pulmonary disease, unspecified: Secondary | ICD-10-CM | POA: Diagnosis present

## 2017-08-24 DIAGNOSIS — Z6841 Body Mass Index (BMI) 40.0 and over, adult: Secondary | ICD-10-CM

## 2017-08-24 DIAGNOSIS — G4733 Obstructive sleep apnea (adult) (pediatric): Secondary | ICD-10-CM | POA: Diagnosis present

## 2017-08-24 DIAGNOSIS — G4730 Sleep apnea, unspecified: Secondary | ICD-10-CM | POA: Diagnosis present

## 2017-08-24 DIAGNOSIS — K219 Gastro-esophageal reflux disease without esophagitis: Secondary | ICD-10-CM | POA: Diagnosis present

## 2017-08-24 DIAGNOSIS — Z8711 Personal history of peptic ulcer disease: Secondary | ICD-10-CM

## 2017-08-24 DIAGNOSIS — Z87891 Personal history of nicotine dependence: Secondary | ICD-10-CM | POA: Diagnosis not present

## 2017-08-24 HISTORY — DX: Epigastric pain: R10.13

## 2017-08-24 LAB — CBC
HEMATOCRIT: 41.3 % (ref 36.0–46.0)
Hemoglobin: 13.8 g/dL (ref 12.0–15.0)
MCH: 25.3 pg — ABNORMAL LOW (ref 26.0–34.0)
MCHC: 33.4 g/dL (ref 30.0–36.0)
MCV: 75.8 fL — AB (ref 78.0–100.0)
Platelets: 212 10*3/uL (ref 150–400)
RBC: 5.45 MIL/uL — ABNORMAL HIGH (ref 3.87–5.11)
RDW: 14.5 % (ref 11.5–15.5)
WBC: 13.5 10*3/uL — ABNORMAL HIGH (ref 4.0–10.5)

## 2017-08-24 LAB — TSH: TSH: 2.124 u[IU]/mL (ref 0.350–4.500)

## 2017-08-24 LAB — BASIC METABOLIC PANEL
Anion gap: 12 (ref 5–15)
BUN: 9 mg/dL (ref 6–20)
CHLORIDE: 104 mmol/L (ref 101–111)
CO2: 21 mmol/L — ABNORMAL LOW (ref 22–32)
Calcium: 8.9 mg/dL (ref 8.9–10.3)
Creatinine, Ser: 1.12 mg/dL — ABNORMAL HIGH (ref 0.44–1.00)
GFR calc Af Amer: 60 mL/min — ABNORMAL LOW (ref >60)
GFR calc non Af Amer: 52 mL/min — ABNORMAL LOW (ref >60)
GLUCOSE: 108 mg/dL — AB (ref 65–99)
POTASSIUM: 3.5 mmol/L (ref 3.5–5.1)
Sodium: 137 mmol/L (ref 135–145)

## 2017-08-24 LAB — HIV ANTIBODY (ROUTINE TESTING W REFLEX): HIV Screen 4th Generation wRfx: NONREACTIVE

## 2017-08-24 LAB — I-STAT TROPONIN, ED: Troponin i, poc: 0.01 ng/mL (ref 0.00–0.08)

## 2017-08-24 LAB — HEMOGLOBIN A1C
Hgb A1c MFr Bld: 5.6 % (ref 4.8–5.6)
Mean Plasma Glucose: 114.02 mg/dL

## 2017-08-24 LAB — TROPONIN I: Troponin I: <0.03 ng/mL (ref <0.03)

## 2017-08-24 LAB — LIPASE, BLOOD: Lipase: 25 U/L (ref 11–51)

## 2017-08-24 LAB — MAGNESIUM: MAGNESIUM: 2.1 mg/dL (ref 1.7–2.4)

## 2017-08-24 LAB — ECHOCARDIOGRAM COMPLETE

## 2017-08-24 MED ORDER — METOPROLOL TARTRATE 25 MG PO TABS
12.5000 mg | ORAL_TABLET | Freq: Two times a day (BID) | ORAL | Status: DC
  Administered 2017-08-24: 12.5 mg via ORAL
  Filled 2017-08-24: qty 1

## 2017-08-24 MED ORDER — PANTOPRAZOLE SODIUM 40 MG PO TBEC
40.0000 mg | DELAYED_RELEASE_TABLET | Freq: Two times a day (BID) | ORAL | Status: DC
  Administered 2017-08-24 (×2): 40 mg via ORAL
  Filled 2017-08-24 (×2): qty 1

## 2017-08-24 MED ORDER — SUCRALFATE 1 G PO TABS: 1.0000 g | ORAL_TABLET | Freq: Three times a day (TID) | ORAL | 0 refills | Status: DC | PRN

## 2017-08-24 MED ORDER — SODIUM CHLORIDE 0.9 % IV SOLN
INTRAVENOUS | Status: DC
  Administered 2017-08-24: 06:00:00 via INTRAVENOUS

## 2017-08-24 MED ORDER — ALBUTEROL SULFATE (2.5 MG/3ML) 0.083% IN NEBU: 3.0000 mL | INHALATION_SOLUTION | Freq: Four times a day (QID) | RESPIRATORY_TRACT | Status: DC | PRN

## 2017-08-24 MED ORDER — PANTOPRAZOLE SODIUM 40 MG PO TBEC: DELAYED_RELEASE_TABLET | ORAL | Status: DC

## 2017-08-24 MED ORDER — ASPIRIN 81 MG PO CHEW
324.0000 mg | CHEWABLE_TABLET | Freq: Once | ORAL | Status: AC
  Administered 2017-08-24: 324 mg via ORAL
  Filled 2017-08-24: qty 4

## 2017-08-24 MED ORDER — ENOXAPARIN SODIUM 40 MG/0.4ML ~~LOC~~ SOLN
40.0000 mg | SUBCUTANEOUS | Status: DC
  Administered 2017-08-24: 40 mg via SUBCUTANEOUS
  Filled 2017-08-24: qty 0.4

## 2017-08-24 MED ORDER — SODIUM CHLORIDE 0.9% FLUSH
3.0000 mL | Freq: Two times a day (BID) | INTRAVENOUS | Status: DC
  Administered 2017-08-24: 3 mL via INTRAVENOUS

## 2017-08-24 MED ORDER — GI COCKTAIL ~~LOC~~
30.0000 mL | Freq: Once | ORAL | Status: AC
  Administered 2017-08-24: 30 mL via ORAL
  Filled 2017-08-24: qty 30

## 2017-08-24 MED ORDER — ASPIRIN EC 81 MG PO TBEC: 81.0000 mg | DELAYED_RELEASE_TABLET | Freq: Every day | ORAL | Status: DC

## 2017-08-24 MED ORDER — MORPHINE SULFATE (PF) 4 MG/ML IV SOLN
2.0000 mg | Freq: Once | INTRAVENOUS | Status: AC
  Administered 2017-08-24: 2 mg via INTRAVENOUS
  Filled 2017-08-24: qty 1

## 2017-08-24 MED ORDER — DILTIAZEM HCL-DEXTROSE 100-5 MG/100ML-% IV SOLN (PREMIX)
5.0000 mg/h | INTRAVENOUS | Status: DC
  Administered 2017-08-24: 5 mg/h via INTRAVENOUS
  Filled 2017-08-24: qty 100

## 2017-08-24 MED ORDER — DIPHENHYDRAMINE HCL 25 MG PO CAPS
25.0000 mg | ORAL_CAPSULE | Freq: Once | ORAL | Status: AC
  Administered 2017-08-24: 25 mg via ORAL
  Filled 2017-08-24: qty 1

## 2017-08-24 MED ORDER — DILTIAZEM LOAD VIA INFUSION
5.0000 mg | Freq: Once | INTRAVENOUS | Status: AC
  Administered 2017-08-24: 5 mg via INTRAVENOUS
  Filled 2017-08-24: qty 5

## 2017-08-24 MED ORDER — MOMETASONE FURO-FORMOTEROL FUM 200-5 MCG/ACT IN AERO
2.0000 | INHALATION_SPRAY | Freq: Two times a day (BID) | RESPIRATORY_TRACT | Status: DC
  Administered 2017-08-24: 2 via RESPIRATORY_TRACT
  Filled 2017-08-24: qty 8.8

## 2017-08-24 MED ORDER — LORATADINE 10 MG PO TABS
10.0000 mg | ORAL_TABLET | Freq: Every day | ORAL | Status: DC
  Administered 2017-08-24: 10 mg via ORAL
  Filled 2017-08-24: qty 1

## 2017-08-24 MED ORDER — METOPROLOL TARTRATE 25 MG PO TABS: 12.5000 mg | ORAL_TABLET | Freq: Two times a day (BID) | ORAL | 0 refills | Status: DC

## 2017-08-24 MED ORDER — ASPIRIN 81 MG PO TBEC: 81.0000 mg | DELAYED_RELEASE_TABLET | Freq: Every day | ORAL | 0 refills | Status: DC

## 2017-08-24 NOTE — ED Notes (Signed)
Admitting MD came down and answered pt follow up questions after the RN reviewed discharge instructions. Wheeled out with all belongings.

## 2017-08-24 NOTE — ED Provider Notes (Signed)
[facility] EMERGENCY DEPARTMENT Provider Note   CSN: [id number] Arrival date & time: 08/24/17  0056     History   Chief Complaint Chief Complaint  Patient presents with  . Chest Pain    HPI Maria Nelson is a 62 y.o. female.  HPI  This is a 62 year old female with a history of COPD who presents with chest pain or shortness of breath.  Reports onset of symptoms 2 days ago.  She states that she has heaviness on her chest.  It is not worsened with exertion.  However, she has had difficulty catching her breath.  This is worse with laying flat.  She states that time she feels like her heart is skipping a beat.  She does not feel palpitations.  Has not noted any leg swelling.  Denies any recent illnesses or fevers.  Denies cough.  Patient denies any history of atrial fibrillation.  Past Medical History:  Diagnosis Date  . 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 Carpal tunnel syndrome 06/23/2011   Patient notes history of bilateral carpal tunnel syndrome.  Now has symptoms of right hand carpal tunnel.   [name] COPD (chronic obstructive pulmonary disease) ([place])    secondary to tobacco use // No PFTs on file  . ETOH abuse    Pt stopped in 3/08  . Gastrointestinal hemorrhage    secondary to PUD on March 2008  . OSA on CPAP    noncompliant with CPAP (2/2 it being depressing)  . Peptic ulcer disease    +H. pylori (antigen)- Dr. Olevia Perches- treated  . STRESS INCONTINENCE 01/24/2007  . Tobacco abuse    quit in 2010  . Urinary incontinence     Patient Active Problem List   Diagnosis Date Noted  . Knee pain, left 08/16/2016  . Skin lesion of face 08/16/2016  . Back pain 06/23/2016  . Urticaria 07/01/2015  . 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 Allergic rhinitis with nonallergic component 03/04/2015  . COPD (chronic obstructive pulmonary disease) ([place]) 06/19/2014  . Blood pressure elevated without history of HTN 03/05/2014  . HNP (herniated nucleus pulposus), lumbar 04/16/2013  .  Tenosynovitis, de Quervain 11/15/2012  . Morbid obesity with BMI of 45.0-49.9, adult ([place]) 09/08/2012  . Carpal tunnel syndrome 06/23/2011  . Sleep apnea 09/02/2010  . Preventative health care 07/28/2010  . GERD 12/24/2009  . Angioedema/urticaria 12/16/2009  . STRESS INCONTINENCE 01/24/2007  . PEPTIC ULCER DISEASE 07/26/2006    Past Surgical History:  Procedure Laterality Date  . BREAST EXCISIONAL BIOPSY Right 1994  . MANDIBLE SURGERY     due to trauma  . TUBAL LIGATION       OB History   None      Home Medications    Prior to Admission medications   Medication Sig Start Date End Date Taking? Authorizing Provider  albuterol (PROVENTIL HFA;VENTOLIN HFA) 108 (90 BASE) MCG/ACT inhaler Inhale 2 puffs into the lungs every 6 (six) hours as needed for wheezing. 03/05/14   [name], MD  albuterol (PROVENTIL) (2.5 MG/3ML) 0.083% nebulizer solution Use 1 vial in nebulizer up to 3 times a day when albuterol inhaler not working. If no better or getting worse, call 911 or physician line 07/11/17   Bartholomew Crews, MD  Azelastine HCl 0.15 % SOLN 1-2 sprays in each nostril 1-2 times daily as needed. Patient not taking: Reported on 10/01/2016 07/01/15   Bobbitt, Sedalia Muta, MD  cetirizine (ZYRTEC) 10 MG tablet Take 1 tablet (10 mg total) by mouth  daily. 06/15/17   Maryellen Pile, MD  clotrimazole [facility]) 10 MG troche Slowly dissolve in the mouth 5 times per day for 10 days, not to be chewed or swallowed whole. Patient not taking: Reported on 01/10/2017 07/01/15   Bobbitt, Sedalia Muta, MD  diclofenac sodium (VOLTAREN) 1 % GEL Apply 2 g topically 4 (four) times daily. Patient not taking: Reported on 01/10/2017 09/08/16   Maryellen Pile, MD  diphenhydrAMINE (BENADRYL) 25 mg capsule Take 1 capsule (25 mg total) by mouth every 12 (twelve) hours as needed for itching or allergies. Patient not taking: Reported on 10/01/2016 02/11/16   Maryellen Pile, MD  EPINEPHrine (EPIPEN 2-PAK) 0.3  mg/0.3 mL IJ SOAJ injection Inject into the muscle once.    [provider]  fexofenadine (ALLEGRA) 60 MG tablet Take 1 tablet (60 mg total) by mouth 2 (two) times daily as needed for allergies or rhinitis. 01/10/17   Bobbitt, Sedalia Muta, MD  gabapentin (NEURONTIN) 300 MG capsule Take 1 capsule (300 mg total) by mouth 2 (two) times daily. Patient not taking: Reported on 10/01/2016 09/10/15 09/09/16  Maryellen Pile, MD  ipratropium (ATROVENT) 0.02 % nebulizer solution INHALE ONE VIAL BY NEBULIZER EVERY 6 HOURS AS NEEDED Patient not taking: Reported on 01/10/2017 07/01/15   Bobbitt, Sedalia Muta, MD  mometasone-formoterol (DULERA) 200-5 MCG/ACT AERO INHALE TWO PUFFS BY MOUTH TWICE DAILY TO PREVENT COUGH OR WHEEZE. RINSE GARGLE AND SPIT AFTER 01/10/17   Bobbitt, Sedalia Muta, MD  nystatin cream (MYCOSTATIN) Apply to affected area 2 times daily Patient not taking: Reported on 01/10/2017 10/01/16   Fredia Sorrow, MD  pantoprazole (PROTONIX) 40 MG tablet TAKE 1 TABLET BY MOUTH ONCE DAILY 07/06/17   Bartholomew Crews, MD  terbinafine (LAMISIL) 1 % cream Apply 1 application topically 2 (two) times daily. Patient not taking: Reported on 10/01/2016 11/27/14   Maryellen Pile, MD    Family History Family History  Problem Relation Age of Onset  . Diabetes Sister   . Breast cancer Sister   . Breast cancer Sister 53       died 2/2 breast ca age 61-60  . Diabetes Mother   . Coronary artery disease Mother 74       requiring 5 vessel CABG    Social History Social History   Tobacco Use  . Smoking status: Former Smoker    Packs/day: 0.40    Types: Cigarettes    Last attempt to quit: 12/04/2010    Years since quitting: 6.7  . Smokeless tobacco: Never Used  . Tobacco comment: stopped 3 weeks ago  Substance Use Topics  . Alcohol use: Yes    Alcohol/week: 0.0 oz    Comment: now drinking a beer on weekends //   . Drug use: No     Allergies   Bee venom and Aspirin   Review of  Systems Review of Systems  Constitutional: Negative for fever.  Respiratory: Positive for shortness of breath. Negative for cough.   Cardiovascular: Positive for chest pain. Negative for palpitations and leg swelling.  Gastrointestinal: Negative for abdominal pain, nausea and vomiting.  Genitourinary: Negative for dysuria.  Neurological: Negative for headaches.  All other systems reviewed and are negative.    Physical Exam Updated Vital Signs BP 104/72   Pulse 76   Temp 98.8 F (37.1 C) (Oral)   Resp 20   SpO2 93%   Physical Exam  Constitutional: She is oriented to person, place, and time.  Uncomfortable appearing but nontoxic  HENT:  Head:  Normocephalic and atraumatic.  Neck: Neck supple.  Cardiovascular: Normal heart sounds. An irregularly irregular rhythm present. Tachycardia present.  No murmur heard. Pulmonary/Chest: Effort normal. No respiratory distress. She has no wheezes.  Abdominal: Soft. Bowel sounds are normal.  Musculoskeletal:       Right lower leg: She exhibits no edema.       Left lower leg: She exhibits no edema.  Neurological: She is alert and oriented to person, place, and time.  Skin: Skin is warm and dry.  Psychiatric: She has a normal mood and affect.  Nursing note and vitals reviewed.    ED Treatments / Results  Labs (all labs ordered are listed, but only abnormal results are displayed) Labs Reviewed  BASIC METABOLIC PANEL - Abnormal; Notable for the following components:      Result Value   CO2 21 (*)    Glucose, Bld 108 (*)    Creatinine, Ser 1.12 (*)    GFR calc non Af Amer 52 (*)    GFR calc Af Amer 60 (*)    All other components within normal limits  CBC - Abnormal; Notable for the following components:   WBC 13.5 (*)    RBC 5.45 (*)    MCV 75.8 (*)    MCH 25.3 (*)    All other components within normal limits  MAGNESIUM  I-STAT TROPONIN, ED    EKG EKG Interpretation  Date/Time:  Wednesday Aug 24 2017 01:49:13  EDT Ventricular Rate:  148 PR Interval:    QRS Duration: 58 QT Interval:  321 QTC Calculation: 504 R Axis:   -10 Text Interpretation:  Atrial fibrillation Paired ventricular premature complexes Low voltage, precordial leads Prolonged QT interval Confirmed by Thayer Jew [phone]) on 08/24/2017 2:03:06 AM   Radiology Dg Chest 2 View  Result Date: 08/24/2017 CLINICAL DATA:  Mid chest pain tonight after taking ibuprofen. EXAM: CHEST - 2 VIEW COMPARISON:  10/01/2016 FINDINGS: Heart and mediastinal contours are stable. No pulmonary consolidation, effusion or pneumothorax. Chronic interstitial prominence of the lungs with bibasilar atelectasis. Degenerative changes are stable along the dorsal spine. IMPRESSION: Bibasilar atelectasis with mild diffuse interstitial prominence likely reflecting stigmata of mild chronic interstitial lung disease. Electronically Signed   By: Ashley Royalty M.D.   On: 08/24/2017 02:20    Procedures Procedures (including critical care time)  CRITICAL CARE Performed by: Merryl Hacker   Total critical care time: 45 minutes  Critical care time was exclusive of separately billable procedures and treating other patients.  Critical care was necessary to treat or prevent imminent or life-threatening deterioration.  Critical care was time spent personally by me on the following activities: development of treatment plan with patient and/or surrogate as well as nursing, discussions with consultants, evaluation of patient's response to treatment, examination of patient, obtaining history from patient or surrogate, ordering and performing treatments and interventions, ordering and review of laboratory studies, ordering and review of radiographic studies, pulse oximetry and re-evaluation of patient's condition.   Medications Ordered in ED Medications  diltiazem (CARDIZEM) 1 mg/mL load via infusion 5 mg (5 mg Intravenous Bolus from Bag 08/24/17 0214)    And  diltiazem  (CARDIZEM) 100 mg in dextrose 5% 116mL (1 mg/mL) infusion (5 mg/hr Intravenous New Bag/Given 08/24/17 0217)  morphine 4 MG/ML injection 2 mg (has no administration in time range)  aspirin chewable tablet 324 mg (324 mg Oral Given 08/24/17 0216)     Initial Impression / Assessment and Plan / ED Course  I  have reviewed the triage vital signs and the nursing notes.  Pertinent labs & imaging results that were available during my care of the patient were reviewed by me and considered in my medical decision making (see chart for details).      This patients CHA2DS2-VASc Score and unadjusted Ischemic Stroke Rate (% per year) is equal to 0.6 % stroke rate/year from a score of 1  Above score calculated as 1 point each if present [CHF, HTN, DM, Vascular=MI/PAD/Aortic Plaque, Age if 65-74, or Female] Above score calculated as 2 points each if present [Age > 75, or Stroke/TIA/TE]   This is a 62 year old female who presents with chest pain or shortness of breath.  Reports 2 days of symptoms.  She is uncomfortable appearing but nontoxic.  She does not appear volume overloaded.  Initial vital signs are notable for tachycardia.  EKG shows atrial fibrillation with RVR.  Patient does not have any history of this.  She initially had low blood pressure of 83/65.  On my evaluation blood pressure is 104/72.  Pulse rate is in the 140s to 160s.  She reports chest pressure.  She has no significant cardiac risk factors with exception of obesity.  Chest x-ray does not show any evidence of pulmonary edema.  Patient was given 5 mg nitro bolus and started on a low-dose diltiazem drip.  On recheck, she appears to be tolerating the drip with her blood pressure.  Heart rates are now in the 110-120 range.  She still complains of chest tightness and pressure but somewhat improved.  Would like to avoid nitroglycerin at this time as blood pressures have been soft.  Patient was given low-dose morphine.  If she tolerates this, we will  continue to titrate drip.  Suspect her symptoms are related to her atrial fibrillation with RVR.  Initial troponin is negative and EKG is otherwise nonischemic.  Patient is a patient of the internal medicine service.  Consulted for admission.  Final Clinical Impressions(s) / ED Diagnoses   Final diagnoses:  Atrial fibrillation with RVR [facility])    ED Discharge Orders        Ordered    Amb referral to AFIB Clinic     08/24/17 0208       Merryl Hacker, MD 08/24/17 [phone]

## 2017-08-24 NOTE — Progress Notes (Signed)
  Echocardiogram 2D Echocardiogram has been performed.  [name] 08/24/2017, 11:07 AM

## 2017-08-24 NOTE — ED Notes (Signed)
Pharmacy messaged about unverified meds 

## 2017-08-24 NOTE — ED Notes (Signed)
Pt updated on plan of care

## 2017-08-24 NOTE — Discharge Summary (Signed)
Name: Maria Nelson MRN: [id number] DOB: 1955-11-27 62 y.o. PCP: Maryellen Pile, MD  Date of Admission: 08/24/2017  1:41 AM Date of Discharge: 08/24/2017 Attending Physician: Bartholomew Crews, MD  Discharge Diagnosis: New onset A-Fib with RVR (principal problem, as below)  Principal Problem:   Atrial fibrillation with RVR ([place]) Active Problems:   History of peptic ulcer disease   Sleep apnea   Morbid obesity with BMI of 45.0-49.9, adult ([place])   COPD (chronic obstructive pulmonary disease) ([place])   Dyspepsia  Discharge Medications: Allergies as of 08/24/2017      Reactions   Bee Venom Anaphylaxis   Aspirin Nausea Only      Medication List    TAKE these medications   albuterol 108 (90 Base) MCG/ACT inhaler Commonly known as:  PROVENTIL HFA;VENTOLIN HFA Inhale 2 puffs into the lungs every 6 (six) hours as needed for wheezing.   albuterol (2.5 MG/3ML) 0.083% nebulizer solution Commonly known as:  PROVENTIL Use 1 vial in nebulizer up to 3 times a day when albuterol inhaler not working. If no better or getting worse, call 911 or physician line   aspirin 81 MG EC tablet Take 1 tablet (81 mg total) by mouth daily. Start taking on:  08/25/2017   cetirizine 10 MG tablet Commonly known as:  ZYRTEC Take 1 tablet (10 mg total) by mouth daily.   EPIPEN 2-PAK 0.3 mg/0.3 mL Soaj injection Generic drug:  EPINEPHrine Inject into the muscle as needed (for allergic reaction).   metoprolol tartrate 25 MG tablet Commonly known as:  LOPRESSOR Take 0.5 tablets (12.5 mg total) by mouth 2 (two) times daily.   mometasone-formoterol 200-5 MCG/ACT Aero Commonly known as:  DULERA INHALE TWO PUFFS BY MOUTH TWICE DAILY TO PREVENT COUGH OR WHEEZE. RINSE GARGLE AND SPIT AFTER   pantoprazole 40 MG tablet Commonly known as:  PROTONIX After leaving hospital, take one tablet twice a day for 1 week. Then go back to taking one tablet every morning. Take medication on empty stomach, at least  30 minutes before eating. What changed:    how much to take  how to take this  when to take this  additional instructions   sucralfate 1 g tablet Commonly known as:  CARAFATE Take 1 tablet (1 g total) by mouth 3 (three) times daily with meals as needed.       Disposition and follow-up:   Ms.[name] was discharged from [facility] in Stable condition.  At the hospital follow up visit please address:  1.  Patient admitted with new, first episode of A-Fib with RVR. Patient converted to NSR after initiation of diltiazem drip. Patient discharged with low-dose metoprolol and referral for event monitor as outpatient. Please assess for palpitations and symptoms of hypotension on follow up. Please also assess ability to follow up with cardiology for event monitoring.  Patient had ongoing dyspepsia during hospitalization which began after using ibuprofen. Patient's symptoms resolved with PRN medications at mealtime. On discharge instructed to increase PPI to BID for one week and use carafate PRN with meals. Please assess symptoms and consider referral for EGD if symptoms persist (hx of PUD 2/2 H. Pylori) or decrease PPI back to qDay (baseline regimen).   Patient has OSA off CPAP 2/2 mask intolerance. Recommend reevaluation of this disease with new sleep study and trial of nasal cannula use qHS rather than face mask for treatment.   2.  Labs / imaging needed at time of follow-up: Holter monitoring (referral to  A-Fib clinic on discharge)  3.  Pending labs/ test needing follow-up: None  Follow-up Appointments: Follow-up Information    Maryellen Pile, MD. Schedule an appointment as soon as possible for a visit in 1 week(s).   Specialty:  Internal Medicine Why:  Please make an appointment at your earliest convienence to see the doctors in the internal medicine clinic within 1 week of discharge. They will discuss your hospitalization and review your symptoms at this  appointment.  Contact information: [place] [phone]        [facility] Follow up.   Specialty:  Cardiology Why:  These physicians will call you after you leave the hospital to arrange cardiac monitoring as an outpatient. If you do not hear from this group by Friday Aug 26, 2017 please use the information on this paperwork to inquire about your appointment.  Contact information: [address] [id number] [place] [facility] Hospital Course by problem list: Principal Problem:   Atrial fibrillation with RVR ([place]) Active Problems:   History of peptic ulcer disease   Sleep apnea   Morbid obesity with BMI of 45.0-49.9, adult ([place])   COPD (chronic obstructive pulmonary disease) ([place])   Dyspepsia   [name] is 62 yo with PMH of sleep apnea, COPD, and peptic ulcer disease who is presented for evaluation of chest pain, palpitations, and nausea and was found to have new onset atrial fibrillation with RVR. She was admitted to the internal medicine teaching service for management. The specific problems addressed during admission are as follows:  New OnsetAtrial Fibrillation with JJH:ERDEYCXK reported 48 hour history of chest pain and SOB leading up to admission after an episode of retching/nausea as a result of taking ibuprofen on an empty stomach. Upon arrival the patient was found to be in new onset atrial fibrillation with rapid ventricular response. Initial troponin, with repeat 4 hours later were within normal limits. The patient arrived slightly hypotensive with SBP in 100s on arrival, but remained hemodynamically stable throughout admission. She was started on diltiazem drip in ED and converted back to normal sinus rhythm within a few hours. In terms of new onset A-Fib workup, TSH and electrolytes within normal limits. Echocardiography did not reveal structural  abnormality for cause of A-Fib and systolic function within normal limits (LVEF 60-65%, trivial tricuspid regurg, chronic pulmonary HTN stable from echo on 2014). Given this, the etiology of her A-Fib is most likely underlying COPD and untreated OSA (see below). She was transitioned to low dose metoprolol 12.5 mg BID on discharge and started on daily enteric coated aspirin 81 mg for CHADS2VASc score of 1 which suggests low-moderate risk of stroke. Patient also to follow up with cardiology for long-term monitoring of her Atrial Fibrillation to see if she truly will require long term metoprolol as an outpatient or if this was an isolated event in the setting of GI distress. Please assess if patient has followed up with Cardiology for Holter monitoring (they were to contact patient directly for monitor placement after pre-authorization received).   Epigastric pain, Hx of PUD 2/2 H. Pylori infection (treated): Patient's symptoms of chest pain and SOB initially occurred in the setting of nausea after taking NSAID on empty stomach, something which the patient has experienced in the past. Patient takes NSAIDs only about 1 time per month and symptoms of acid reflex, including dyspepsia after meals, previously well controlled on  Protonix qDay. Upon arrival patient did not endorse hematemesis or change in bowel movements. Her Hgb within normal limits, no signs of hematemesis. Suspect that patient's dyspepsia a result of gastritis 2/2 recent NSAID use given her history of rare ibuprofen use and reported intolerance of Aleve, Motrin in the past. Patient's symptoms of dyspepsia resolved with GI cocktail overnight, but returned after eating breakfast in the morning. Patient was instructed to increase dose of Protonix to BID for one weak and to take CarafateTID with meals after discharge. She was also instructed to sit upright after meals for at least 30-60 minutes. Please assess for signs and symptoms of dyspepsia. If still  persistent on discharge, suggest referral to GI for possible EGD given history of H. Pylori (stool antigen testing not reliable in patient's on chronic PPI). If symptoms well controlled, recommend de-escalation of therapy.  OSA, untreated: Patient has history of OSA diagnosed by sleep study in 2012 but doesn't wear CPAP each night 2/2 anxiety with use of full mask. Patient's echocardiography revealed stable pulmonary HTN since 2014, suggesting ongoing but stable chronic lung disease. To prevent progression of her disease it is recommended that patient repeat sleep study and try nasal cannula at night rather than sleep mask. Patient seemed willing to do this during hospitalization, please assess at follow up.   Discharge Vitals:   BP 112/76 (BP Location: Left Arm)   Pulse 72   Temp 98.8 F (37.1 C) (Oral)   Resp 15   SpO2 96%   Pertinent Labs, Studies, and Procedures:   BMP Latest Ref Rng & Units 08/24/2017 10/01/2016 09/04/2014  Glucose 65 - 99 mg/dL 108(H) 106(H) 165(H)  BUN 6 - 20 mg/dL 9 11 13   Creatinine 0.44 - 1.00 mg/dL 1.12(H) 1.12(H) 1.11(H)  Sodium 135 - 145 mmol/L 137 135 139  Potassium 3.5 - 5.1 mmol/L 3.5 3.9 4.1  Chloride 101 - 111 mmol/L 104 104 107  CO2 22 - 32 mmol/L 21(L) 24 25  Calcium 8.9 - 10.3 mg/dL 8.9 9.1 9.4   CBC Latest Ref Rng & Units 08/24/2017 10/01/2016 09/03/2014  WBC 4.0 - 10.5 K/uL 13.5(H) 13.6(H) -  Hemoglobin 12.0 - 15.0 g/dL 13.8 13.1 16.3(H)  Hematocrit 36.0 - 46.0 % 41.3 39.6 48.0(H)  Platelets 150 - 400 K/uL 212 241 -   Troponin x2: 0.01, <0.03 TSH = 2.124 Hemoglobin A1C = 5.6%  Echocardiography, 08/24/2017 Study Conclusions: - Left ventricle: The cavity size was normal. Wall thickness was   normal. Systolic function was normal. The estimated ejection   fraction was in the range of 60% to 65%. Wall motion was normal;   there were no regional wall motion abnormalities. Left   ventricular diastolic function parameters were normal. - Left atrium: The  atrium was normal in size. - Tricuspid valve: There was mild regurgitation. - Pulmonary arteries: PA peak pressure: 37 mm Hg (S). - Inferior vena cava: The vessel was normal in size. The   respirophasic diameter changes were in the normal range (= 50%),   consistent with normal central venous pressure.  Impressions: - LVEF 60-65%, normal wall thickness, normal wall motion, normal   diastolic function, normal LA size, mild TR, RVSP 37 mmHg, normal   IVC.  Discharge Instructions: Discharge Instructions    Amb referral to AFIB Clinic   Complete by:  As directed    Call MD for:  difficulty breathing, headache or visual disturbances   Complete by:  As directed    Call MD for:  persistant dizziness or light-headedness   Complete by:  As directed    Call MD for:  persistant nausea and vomiting   Complete by:  As directed    Call MD for:  temperature >100.4   Complete by:  As directed    Diet - low sodium heart healthy   Complete by:  As directed    Discharge instructions   Complete by:  As directed    You were evaluated for chest pain and shortness of breath. Your heart was found to be in an abnormal heart rhythm termed Atrial Fibrillation. You were given medication in the emergency room which helped your heart go back into it's normal rhythm. You will continue taking this medication, called metoprolol, after leaving the hospital. You will take one 12.5 mg metoprolol tablet two times a day (once in the morning and once at bedtime). This heart rhythm also puts you at a slight increased risk of developing blood clots. Given this increased risk we recommend you take a baby aspirin every day so that you can help prevent clots from forming and causing strokes. Since you have a sensitive stomach, we recommend you take coated aspirin that is usually better tolerated. A prescription for metoprolol and the coated baby aspirin was sent to your pharmacy on discharge.   To see if you need to stay on this  medication long term, you will need to wear a monitor so we can closely watch your heart's rhythm. We have already contacted the cardiologists who are working on arranging this monitor. They will contact you after leaving the hospital to set up this monitoring.   You also experienced nausea and stomach pains while in the hospital. We think that the ibuprofen you took a few days prior to hospitalization upset the lining of your stomach and that this caused your nausea/stomach pain. To help your stomach lining heal, please take one Protonix tablet two times a day for a total of 7 days after leaving the hospital. You can also take Carafate with meals as needed after leaving the hospital to help prevent these stomach pains. If these stomach pains do not resolve by the time you follow up with your primary care provider, please let them know.   After leaving the hospital you will need to follow up closely with your primary care physician in the internal medicine clinic. Please call the clinic to make an appointment to be seen within one week of discharge.   Increase activity slowly   Complete by:  As directed       Signed: Thomasene Ripple, MD 08/24/2017, 2:20 PM   Pager: [phone]

## 2017-08-24 NOTE — ED Notes (Signed)
Breakfast tray ordered 

## 2017-08-24 NOTE — ED Notes (Signed)
Patient c/o itching/irritation on RIGHT arm where BP cuff was. Slight redness/swelling noted. Patient requesting benadryl.  Will page admitting.

## 2017-08-24 NOTE — ED Notes (Signed)
Patient returned from Echo.

## 2017-08-24 NOTE — Progress Notes (Addendum)
Subjective:  Patient seen sitting comfortably in bed this AM in no acute distress. States that the chest pain and SOB which precipitated admission to hospital are much improved overnight. Patient also notices interval improvement in epigastric pain/nausea that was also noted on admission. Patient continues to endorse cough, which worsens her SOB, but not other significant complaints.  Objective:  Vital signs in last 24 hours: Vitals:   08/24/17 0615 08/24/17 0630 08/24/17 0645 08/24/17 0715  BP: 100/76 99/83 99/75  106/70  Pulse: 88 91 87 60  Resp: (!) 22 20 15 20   Temp:      TempSrc:      SpO2: 93% 91% 94% 93%   Physical Exam  Constitutional: She appears well-developed and well-nourished. No distress.  HENT:  Mouth/Throat: Oropharynx is clear and moist. No oropharyngeal exudate.  Eyes: Conjunctivae and EOM are normal.  Cardiovascular: Normal rate, regular rhythm and intact distal pulses. Exam reveals no friction rub.  No murmur heard. Respiratory: Effort normal. No respiratory distress. She has no wheezes. She has no rales.  GI: Soft. Bowel sounds are normal. She exhibits no distension. There is no tenderness. There is no rebound.  Musculoskeletal: She exhibits no edema (of bilateral lower extremities) or tenderness (of bilateral lower extremities).  Neurological:  Face strength and sensation intact bilaterally. Tongue midline. Gross motor and sensation to light touch of upper and lower extremities intact bilaterally.  Skin: Skin is warm and dry. No rash noted. She is not diaphoretic. No erythema.  Psychiatric: Her behavior is normal. Thought content normal.   Assessment/Plan:  Principal Problem:   Atrial fibrillation with RVR ([facility]) Active Problems:   History of peptic ulcer disease   Sleep apnea   Morbid obesity with BMI of 45.0-49.9, adult ([facility])   COPD (chronic obstructive pulmonary disease) ([facility])  Maria Nelson is 62 yo with PMH of sleep apnea, COPD, and peptic  ulcer disease who is presented for evaluation of chest pain, palpitations, and nausea and was found to have new onset atrial fibrillation with RVR. She was admitted to the internal medicine teaching service for management. The specific problems addressed during admission are as follows:  New Onset Atrial Fibrillation with RVR: Patient converted back to normal sinus rhythm while on Diltiazem drip running at 5 mg/hr, confirmed NSR with EKG this AM. Troponin x2 within normal limits. No current chest pain or shortness of breath since converting back to sinus rhythm overnight. CHADS2VASc = 1, with only risk factor being female gender. This suggests low-moderate risk and that patient may benefit from daily aspirin 81 mg therapy. In terms of new onset A-Fib, TSH and electrolytes within normal limits. Will plan to evaluate for structural abnormality with echocardiography and transition off IV drip to oral metoprolol today.  -F/u Echocardiogram -Enteric coated daily aspirin 81 mg qDay -Metoprolol 12.5 mg BID  Epigastric pain, Hx of PUD: Patient's Hgb within normal limits, no signs of hematemesis. Patient previously well controlled on Protonix qAM as an outpatient. Her acute epigastric pain likely a result of NSAID gastritis and now resolved with GI cocktail overnight.  -Continue to monitor -Continue Protonix 40mg  BID while inpatient -GI Cocktail PRN  Asthma with COPD: No current signs or symptoms of acute exacerbation on exam. At home patient takes Dulera 2 puffs BID, Albuterol 2 puffs q6h PRN, and Albuterol Nebs PRN.  -Continue home regimen while inpatient.   OSA: Has CPAP but doesn't tolerate full mask. Will try nasal mask this evening and patient will need renewal/reevaluation as  outpatient for this upon discharge if she tolerates it well  -CPAP qhs (nasal mask)  FEN/GI: -Heart Healthy diet -No IVF, electrolytes wnl -Protonix BID  VTE Prophylaxis: Lovenox daily Code Status: Full  Dispo:  Anticipated discharge in approximately 0-1 day(s).   Thomasene Ripple, MD 08/24/2017, 7:44 AM Pager: [phone]

## 2017-08-24 NOTE — Telephone Encounter (Signed)
LM with pt sister for pt to [facility] for follow up from ED for afib

## 2017-08-24 NOTE — ED Notes (Signed)
Pt ambulated in hallway while holding onto the wall handrail. Pt wouldn't ambulated without holding onto or leaning on something. When asked how she was feeling, pt stated "Except for the pain in my chest I feel fine."

## 2017-08-24 NOTE — ED Notes (Signed)
When rounding on patient I noticed rounding MD paused cardizem drip. --RN unaware

## 2017-08-24 NOTE — ED Notes (Signed)
Spoke with Dr. Yaakov Guthrie, patient NSR with HR 70 she said to hold cardizem drip at this time and if HR goes up to restart.

## 2017-08-24 NOTE — ED Notes (Signed)
Provider at bedside to answer pt questions.

## 2017-08-24 NOTE — ED Notes (Signed)
Hospital bed ordered.

## 2017-08-24 NOTE — ED Triage Notes (Signed)
Pt reports central CP and SOB that strarted tonight, worse when laying down, some nausea. Productive cough with red sputum, denies fevers. New onset of afib, initial pressure 83/65, recheck 100/77

## 2017-08-24 NOTE — Discharge Instructions (Signed)
Atrial Fibrillation Atrial fibrillation is a type of heartbeat that is irregular or fast (rapid). If you have this condition, your heart keeps quivering in a weird (chaotic) way. This condition can make it so your heart cannot pump blood normally. Having this condition gives a person more risk for stroke, heart failure, and other heart problems. There are different types of atrial fibrillation. Talk with your doctor to learn about the type that you have. Follow these instructions at home:  Take over-the-counter and prescription medicines only as told by your doctor.  If your doctor prescribed a blood-thinning medicine, take it exactly as told. Taking too much of it can cause bleeding. If you do not take enough of it, you will not have the protection that you need against stroke and other problems.  Do not use any tobacco products. These include cigarettes, chewing tobacco, and e-cigarettes. If you need help quitting, ask your doctor.  If you have apnea (obstructive sleep apnea), manage it as told by your doctor.  Do not drink alcohol.  Do not drink beverages that have caffeine. These include coffee, soda, and tea.  Maintain a healthy weight. Do not use diet pills unless your doctor says they are safe for you. Diet pills may make heart problems worse.  Follow diet instructions as told by your doctor.  Exercise regularly as told by your doctor.  Keep all follow-up visits as told by your doctor. This is important. Contact a doctor if:  You notice a change in the speed, rhythm, or strength of your heartbeat.  You are taking a blood-thinning medicine and you notice more bruising.  You get tired more easily when you move or exercise. Get help right away if:  You have pain in your chest or your belly (abdomen).  You have sweating or weakness.  You feel sick to your stomach (nauseous).  You notice blood in your throw up (vomit), poop (stool), or pee (urine).  You are short of  breath.  You suddenly have swollen feet and ankles.  You feel dizzy.  Your suddenly get weak or numb in your face, arms, or legs, especially if it happens on one side of your body.  You have trouble talking, trouble understanding, or both.  Your face or your eyelid droops on one side. These symptoms may be an emergency. Do not wait to see if the symptoms will go away. Get medical help right away. Call your local emergency services (911 in the [place] Do not drive yourself to the hospital. This information is not intended to replace advice given to you by your health care provider. Make sure you discuss any questions you have with your health care provider. Document Released: 01/13/2008 Document Revised: 09/11/2015 Document Reviewed: 07/31/2014 Elsevier Interactive Patient Education  2018 [place] is a feeling of pain, discomfort, burning, or fullness in the upper part of your belly (abdomen). It can come and go. It may occur often or rarely. Indigestion tends to happen while you are eating or right after you have finished eating. It may be worse at night and while bending over or lying down. Follow these instructions at home: Take these actions to lessen your pain or discomfort and to help avoid problems. Diet  Follow a diet as told by your doctor. You may need to avoid foods and drinks such as: ? Coffee and tea (with or without caffeine). ? Drinks that contain alcohol. ? Energy drinks and sports drinks. ? Carbonated drinks  or sodas. ? Chocolate and cocoa. ? Peppermint and mint flavorings. ? Garlic and onions. ? Horseradish. ? Spicy and acidic foods, such as peppers, chili powder, curry powder, vinegar, hot sauces, and BBQ sauce. ? Citrus fruit juices and citrus fruits, such as oranges, lemons, and limes. ? Tomato-based foods, such as red sauce, chili, salsa, and pizza with red sauce. ? Fried and fatty foods, such as donuts, french fries, potato  chips, and high-fat dressings. ? High-fat meats, such as hot dogs, rib eye steak, sausage, ham, and bacon. ? High-fat dairy items, such as whole milk, butter, and cream cheese.  Eat small meals often. Avoid eating large meals.  Avoid drinking large amounts of liquid with your meals.  Avoid eating meals during the 2-3 hours before bedtime.  Avoid lying down right after you eat.  Do not exercise right after you eat. General instructions  Pay attention to any changes in your symptoms.  Take over-the-counter and prescription medicines only as told by your doctor. Do not take aspirin, ibuprofen, or other NSAIDs unless your doctor says it is okay.  Do not use any tobacco products, including cigarettes, chewing tobacco, and e-cigarettes. If you need help quitting, ask your doctor.  Wear loose clothes. Do not wear anything tight around your waist.  Raise (elevate) the head of your bed about 6 inches (15 cm).  Try to lower your stress. If you need help doing this, ask your doctor.  If you are overweight, lose an amount of weight that is healthy for you. Ask your doctor about a safe weight loss goal.  Keep all follow-up visits as told by your doctor. This is important. Contact a doctor if:  You have new symptoms.  You lose weight and you do not know why it is happening.  You have trouble swallowing, or it hurts to swallow.  Your symptoms do not get better with treatment.  Your symptoms last for more than two days.  You have a fever.  You throw up (vomit). Get help right away if:  You have pain in your arms, neck, jaw, teeth, or back.  You feel sweaty, dizzy, or light-headed.  You pass out (faint).  You have chest pain or shortness of breath.  You cannot stop throwing up, or you throw up blood.  Your poop (stool) is bloody or black.  You have very bad pain in your belly. This information is not intended to replace advice given to you by your health care provider. Make  sure you discuss any questions you have with your health care provider. Document Released: 05/08/2010 Document Revised: 09/11/2015 Document Reviewed: 07/31/2014 Elsevier Interactive Patient Education  [employer].

## 2017-08-24 NOTE — H&P (Signed)
Date: 08/24/2017               Patient Name:  Maria Nelson MRN: [id number]  DOB: July 28, 1955 Age / Sex: 62 y.o., female   PCP: Maryellen Pile, MD         Medical Service: Internal Medicine Teaching Service         Attending Physician: Dr. Bartholomew Crews, MD    First Contact: Dr. Berneice Gandy Pager: [phone]  Second Contact: Dr. Reesa Chew Pager: [phone]       After Hours (After 5p/  First Contact Pager: [phone]  weekends / holidays): Second Contact Pager: [phone]   Chief Complaint: Chest Pain, Shortness of breath  History of Present Illness: [name] is a 62 yo F with Hx of Asthma with COPD, PUD, OSA, and Angioedema who presented with Chest Pain and Shortness of breath. Patient states that her initial symptoms began on Sunday and started with nausea and dry heaves after taking Ibuprofen on an empty stomach. She states that she then began to feel short of breath for which she took breathing treatments that provided relief. She states that she also began to experience chest pain described as a burning 10/10 pain located at her epigastrium, which was soothed after taking a glass of milk. She has not noticed any aggravating factors. She states that she has not had any further nausea or dry heaves since Sunday, but her intermittent dyspnea and chest/epigastric pain has persisted since that time. She states that she has woken up with the pain at night. She endorses chills, cough productive of clear sputum with red specks, lightheadedness (with coughing), and sensation of hear heart "skipping a beat". She denies fevers, diarrhea, constipation, headaches, dark or bloody stools.  In the ED, patient was noted to be in A. Fib with RVR to 130s-170s and initially had soft BP 41D-408X Systolic (which had improved to 448J-856D systolic by the time of interview). CBC showed stable Hgb at 13.8, Leukocytosis at 13.6; BMP showed Cr 1.12 (which appears to be her baseline); Mg 2.1, iSTAT Troponin 0.01.  EKG showed Atrial fibrillation @ 100 bpm with PVC. CXR showed stable bibasilar atelectasis. Patient received 324mg  of ASA, 4mg  Morphine, and was started on a diltiazem drip. She is to be admitted for further workup and care.   Meds:  Current Meds  Medication Sig  . albuterol (PROVENTIL HFA;VENTOLIN HFA) 108 (90 BASE) MCG/ACT inhaler Inhale 2 puffs into the lungs every 6 (six) hours as needed for wheezing.  [name] albuterol (PROVENTIL) (2.5 MG/3ML) 0.083% nebulizer solution Use 1 vial in nebulizer up to 3 times a day when albuterol inhaler not working. If no better or getting worse, call 911 or physician line  . cetirizine (ZYRTEC) 10 MG tablet Take 1 tablet (10 mg total) by mouth daily.  [name] EPINEPHrine (EPIPEN 2-PAK) 0.3 mg/0.3 mL IJ SOAJ injection Inject into the muscle as needed (for allergic reaction).   . mometasone-formoterol (DULERA) 200-5 MCG/ACT AERO INHALE TWO PUFFS BY MOUTH TWICE DAILY TO PREVENT COUGH OR WHEEZE. RINSE GARGLE AND SPIT AFTER  . pantoprazole (PROTONIX) 40 MG tablet TAKE 1 TABLET BY MOUTH ONCE DAILY   Allergies: Allergies as of 08/24/2017 - Review Complete 08/24/2017  Allergen Reaction Noted  . Bee venom Anaphylaxis 06/30/2012  . Aspirin Nausea Only    Past Medical History:  Diagnosis Date  . 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 Carpal tunnel syndrome 06/23/2011   Patient notes history of bilateral carpal tunnel syndrome.  Now has symptoms  of right hand carpal tunnel.   [name] COPD (chronic obstructive pulmonary disease) ([place])    secondary to tobacco use // No PFTs on file  . ETOH abuse    Pt stopped in 3/08  . Gastrointestinal hemorrhage    secondary to PUD on March 2008  . OSA on CPAP    noncompliant with CPAP (2/2 it being depressing)  . Peptic ulcer disease    +H. pylori (antigen)- Dr. Olevia Perches- treated  . STRESS INCONTINENCE 01/24/2007  . Tobacco abuse    quit in 2010  . Urinary incontinence     Family History: Family History  Problem Relation Age of Onset  . Diabetes Sister    . Breast cancer Sister   . Breast cancer Sister 28       died 2/2 breast ca age 56-60  . Diabetes Mother   . Coronary artery disease Mother 16       requiring 5 vessel CABG  - Reviewed on admission  Social History: Social History   Tobacco Use  . Smoking status: Former Smoker    Packs/day: 1.00    Years: 30.00    Pack years: 30.00    Types: Cigarettes    Last attempt to quit: 12/04/2010    Years since quitting: 6.7  . Smokeless tobacco: Never Used  Substance Use Topics  . Alcohol use: Yes    Alcohol/week: 0.0 oz    Comment: now drinking a beer on weekends //   . Drug use: No  - Reviewed on Admission  Review of Systems: A complete ROS was negative except as per HPI.  Physical Exam: Blood pressure 125/86, pulse 79, temperature 98.8 F (37.1 C), temperature source Oral, resp. rate 14, SpO2 95 %. Physical Exam  Constitutional: She is oriented to person, place, and time. She appears well-developed and well-nourished.  HENT:  Head: Normocephalic and atraumatic.  Eyes: EOM are normal. Right eye exhibits no discharge. Left eye exhibits no discharge.  Cardiovascular: Normal heart sounds and intact distal pulses.  Irregularly irregular rhythm Tachycardia  Pulmonary/Chest: Effort normal. No respiratory distress.  Trace wheezing LLL, otherwise clear  Abdominal: Soft. Bowel sounds are normal. She exhibits no distension.  Tender to palpation of epigastrum  Musculoskeletal: She exhibits no edema or deformity.  Neurological: She is alert and oriented to person, place, and time.  Skin: Skin is warm and dry.   EKG: personally reviewed my interpretation is Atrial fibrillation @ 100 bpm with PVC  CXR: IMPRESSION: Bibasilar atelectasis with mild diffuse interstitial prominence likely reflecting stigmata of mild chronic interstitial lung Disease. - Stable from previous  Assessment & Plan by Problem: Principal Problem:   Atrial fibrillation with RVR (HCC) Active Problems:    Sleep apnea   Morbid obesity with BMI of 45.0-49.9, adult (HCC)   COPD (chronic obstructive pulmonary disease) (HCC)  New Onset Atrial Fibrillation with RVR: On admission patient found to be in A.Fib with RVR. No prior history of A.Fib. Date of onset unclear, but likely at least since Sunday when patient began to experience sensation of her heart skipping a beat. CHADS2VASc 1 (Female). Patient has history of OSA with nonadherance to CPAP, which may have contributed to development of her A. Fib. Will evaluate for structural heart disease. Started on Diltiazem gtt in ED, slowly titrating up due to softer BP on presentation, blood pressure now 315V-761Y Systolic.  - Diltiazem drip, will convert to orals when better controlled - NS @ 125cc/hr - Echocardiogram - AM EKG - AM  CMP and CBC  Chest Pain PUD: GI Hemorrhage (>10 year ago) with Biopsy + for H. Pylori, s/p treatment) no record of repeat EGD. Did take Ibuprofen on Sunday with epigastric/chest pain since that time, which she states is soothed by a glass of milk. Reports Ibuprofen use just 1 per month and avoids other NSAIDs. Denies dark stools, last bowl movement on Sunday. Hgb Stable in ED at 13.8. Takes Protonix 40mg  Daily at home. May be experiencing gastritis vs ulcer given presentation, possibly 2/2 recurrence of H. Pylori. > Initial chest/epigastric pain eval in the setting of new a.fib included troponin, will trend out (iSTAT 0.01) - Protonix 40mg  BID - GI Cocktail - CBC in AM  OSA: Has CPAP, but state she is unable to tolerate the full face mask, states she is willing to try nasal mask. - CPAP qhs (nasal mask)  Asthma with COPD: Takes Dulera 200-61mcg 2 puffs BID, Albuterol 2 puffs q6h PRN, and Albuterol Nebs PRN when inhaler is not working.  - Continue home Dulera and Albuterol   Angioedema: Has been worked up by Allergy/Immunology with unrevealing lab work. Has Epi-pen prescribed if needed.  FEN: Heart, NS A 125cc/hr VTE ppx:  Lovenox Code Status:  Full   Dispo: Admit patient to Inpatient with expected length of stay greater than 2 midnights.  Signed: Neva Seat, MD 08/24/2017, 4:38 AM  Pager: [phone]

## 2017-08-24 NOTE — ED Notes (Signed)
Per [place], MD monitor BP after morphine. If BP maintains, titrate drip by 2.5 mg. Will continue to monitor.

## 2017-08-25 LAB — HEPATITIS C ANTIBODY

## 2017-08-29 ENCOUNTER — Encounter (HOSPITAL_COMMUNITY): Payer: Self-pay | Admitting: Nurse Practitioner

## 2017-08-29 ENCOUNTER — Ambulatory Visit (HOSPITAL_COMMUNITY)
Admission: RE | Admit: 2017-08-29 | Discharge: 2017-08-29 | Disposition: A | Discharge status: Home / Self Care | Payer: Medicaid Other | Source: Ambulatory Visit | Attending: Nurse Practitioner | Admitting: Nurse Practitioner

## 2017-08-29 VITALS — BP 122/80 | HR 64 | Ht 60.0 in | Wt 241.0 lb

## 2017-08-29 DIAGNOSIS — I4891 Unspecified atrial fibrillation: Secondary | ICD-10-CM | POA: Diagnosis not present

## 2017-08-29 DIAGNOSIS — J449 Chronic obstructive pulmonary disease, unspecified: Secondary | ICD-10-CM | POA: Diagnosis not present

## 2017-08-29 DIAGNOSIS — Z8249 Family history of ischemic heart disease and other diseases of the circulatory system: Secondary | ICD-10-CM | POA: Insufficient documentation

## 2017-08-29 DIAGNOSIS — I480 Paroxysmal atrial fibrillation: Secondary | ICD-10-CM | POA: Diagnosis not present

## 2017-08-29 DIAGNOSIS — G5603 Carpal tunnel syndrome, bilateral upper limbs: Secondary | ICD-10-CM | POA: Insufficient documentation

## 2017-08-29 DIAGNOSIS — Z833 Family history of diabetes mellitus: Secondary | ICD-10-CM | POA: Insufficient documentation

## 2017-08-29 DIAGNOSIS — Z91030 Bee allergy status: Secondary | ICD-10-CM | POA: Diagnosis not present

## 2017-08-29 DIAGNOSIS — E669 Obesity, unspecified: Secondary | ICD-10-CM | POA: Insufficient documentation

## 2017-08-29 DIAGNOSIS — Z7982 Long term (current) use of aspirin: Secondary | ICD-10-CM | POA: Insufficient documentation

## 2017-08-29 DIAGNOSIS — Z803 Family history of malignant neoplasm of breast: Secondary | ICD-10-CM | POA: Diagnosis not present

## 2017-08-29 DIAGNOSIS — Z79899 Other long term (current) drug therapy: Secondary | ICD-10-CM | POA: Insufficient documentation

## 2017-08-29 DIAGNOSIS — Z886 Allergy status to analgesic agent status: Secondary | ICD-10-CM | POA: Insufficient documentation

## 2017-08-29 DIAGNOSIS — Z87891 Personal history of nicotine dependence: Secondary | ICD-10-CM | POA: Insufficient documentation

## 2017-08-29 DIAGNOSIS — Z6841 Body Mass Index (BMI) 40.0 and over, adult: Secondary | ICD-10-CM | POA: Insufficient documentation

## 2017-08-29 DIAGNOSIS — G4733 Obstructive sleep apnea (adult) (pediatric): Secondary | ICD-10-CM | POA: Insufficient documentation

## 2017-08-29 NOTE — Progress Notes (Signed)
error 

## 2017-08-29 NOTE — Progress Notes (Signed)
Primary Care Physician: Maryellen Pile, MD Referring Physician:[facility] ER f/u   Maria Nelson is a 62 y.o. female with a remote h/o OSA, not treated, obesity, that presented to the ER with chest pain, palpitations and was found to be in new onset afib with RVR. She had RVR  and a soft BP, Cardizem drip was started and pt converted. She remains in SR today. Triggers reviewed and biggest offense is treated sleep apnea. I will refer her for repeat study as it has been many years since the initial test. She uses alcohol very rarely, no tobacco, minimal caffeine, sedentary. She was having some indigestion in the hospital, thought possible 2/2 to taking ibuprofen on an empty stomach. This has resolved with Mylanta that she has been using since home.Chadsvasc score is 1 for female on baby asa. Echo done in the hospital without any significant structure changes. Pending an event monitor and to get established with Internal Medicine.  Today, she denies symptoms of palpitations, chest pain, shortness of breath, orthopnea, PND, lower extremity edema, dizziness, presyncope, syncope, or neurologic sequela. The patient is tolerating medications without difficulties and is otherwise without complaint today.   Past Medical History:  Diagnosis Date  . 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 Carpal tunnel syndrome 06/23/2011   Patient notes history of bilateral carpal tunnel syndrome.  Now has symptoms of right hand carpal tunnel.   [name] COPD (chronic obstructive pulmonary disease) ([place])    secondary to tobacco use // No PFTs on file  . ETOH abuse    Pt stopped in 3/08  . Gastrointestinal hemorrhage    secondary to PUD on March 2008  . OSA on CPAP    noncompliant with CPAP (2/2 it being depressing)  . Peptic ulcer disease    +H. pylori (antigen)- Dr. Olevia Perches- treated  . STRESS INCONTINENCE 01/24/2007  . Tobacco abuse    quit in 2010  . Urinary incontinence    Past Surgical History:  Procedure Laterality Date  . BREAST  EXCISIONAL BIOPSY Right 1994  . MANDIBLE SURGERY     due to trauma  . TUBAL LIGATION      Current Outpatient Medications  Medication Sig Dispense Refill  . albuterol (PROVENTIL HFA;VENTOLIN HFA) 108 (90 BASE) MCG/ACT inhaler Inhale 2 puffs into the lungs every 6 (six) hours as needed for wheezing. 3.7 g 3  . albuterol (PROVENTIL) (2.5 MG/3ML) 0.083% nebulizer solution Use 1 vial in nebulizer up to 3 times a day when albuterol inhaler not working. If no better or getting worse, call 911 or physician line 75 mL 3  . aspirin 81 MG EC tablet Take 1 tablet (81 mg total) by mouth daily. 30 tablet 0  . cetirizine (ZYRTEC) 10 MG tablet Take 1 tablet (10 mg total) by mouth daily. 90 tablet 2  . metoprolol tartrate (LOPRESSOR) 25 MG tablet Take 0.5 tablets (12.5 mg total) by mouth 2 (two) times daily. 60 tablet 0  . mometasone-formoterol (DULERA) 200-5 MCG/ACT AERO INHALE TWO PUFFS BY MOUTH TWICE DAILY TO PREVENT COUGH OR WHEEZE. RINSE GARGLE AND SPIT AFTER 13 g 5  . pantoprazole (PROTONIX) 40 MG tablet After leaving hospital, take one tablet twice a day for 1 week. Then go back to taking one tablet every morning. Take medication on empty stomach, at least 30 minutes before eating.    . sucralfate (CARAFATE) 1 g tablet Take 1 tablet (1 g total) by mouth 3 (three) times daily with meals as needed. 120 tablet 0  . EPINEPHrine (  EPIPEN 2-PAK) 0.3 mg/0.3 mL IJ SOAJ injection Inject into the muscle as needed (for allergic reaction).      No current facility-administered medications for this encounter.     Allergies  Allergen Reactions  . Bee Venom Anaphylaxis  . Aspirin Nausea Only    Social History   Socioeconomic History  . Marital status: Single    Spouse name: Not on file  . Number of children: 3  . Years of education: 12th grade  . Highest education level: Not on file  Occupational History  . Occupation: Disability    Comment: previously worked in [employer] had to stop 2/2 occupational  exposures leading to exacerbations of asthma   Social Needs  . Financial resource strain: Not on file  . Food insecurity:    Worry: Not on file    Inability: Not on file  . Transportation needs:    Medical: Not on file    Non-medical: Not on file  Tobacco Use  . Smoking status: Former Smoker    Packs/day: 1.00    Years: 30.00    Pack years: 30.00    Types: Cigarettes    Last attempt to quit: 12/04/2010    Years since quitting: 6.7  . Smokeless tobacco: Never Used  Substance and Sexual Activity  . Alcohol use: Yes    Alcohol/week: 0.0 oz    Comment: now drinking a beer on weekends //   . Drug use: No  . Sexual activity: Not on file  Lifestyle  . Physical activity:    Days per week: Not on file    Minutes per session: Not on file  . Stress: Not on file  Relationships  . Social connections:    Talks on phone: Not on file    Gets together: Not on file    Attends religious service: Not on file    Active member of club or organization: Not on file    Attends meetings of clubs or organizations: Not on file    Relationship status: Not on file  . Intimate partner violence:    Fear of current or ex partner: Not on file    Emotionally abused: Not on file    Physically abused: Not on file    Forced sexual activity: Not on file  Other Topics Concern  . 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      Pt lives with her son      Pt has received financial assistance approval for 100% discount at [facility] and has [facility] card.     Family History  Problem Relation Age of Onset  . Diabetes Sister   . Breast cancer Sister   . Breast cancer Sister 44       died 2/2 breast ca age 59-60  . Diabetes Mother   . Coronary artery disease Mother 34       requiring 5 vessel CABG    ROS- All systems are reviewed and negative except as per the HPI above  Physical Exam: Vitals:   08/29/17 0938  BP: 122/80  Pulse:  64  SpO2: 94%  Weight: 241 lb (109.3 kg)  Height: 5' (1.524 m)   Wt Readings from Last 3 Encounters:  08/29/17 241 lb (109.3 kg)  09/08/16 248 lb 11.2 oz (112.8 kg)  08/11/16 252 lb 9.6 oz (114.6 kg)    Labs: Lab Results  Component Value Date  NA 137 08/24/2017   K 3.5 08/24/2017   CL 104 08/24/2017   CO2 21 (L) 08/24/2017   GLUCOSE 108 (H) 08/24/2017   BUN 9 08/24/2017   CREATININE 1.12 (H) 08/24/2017   CALCIUM 8.9 08/24/2017   MG 2.1 08/24/2017   No results found for: INR Lab Results  Component Value Date   CHOL 185 09/09/2012   HDL 81 09/09/2012   LDLCALC 93 09/09/2012   TRIG 55 09/09/2012     GEN- The patient is well appearing, alert and oriented x 3 today.   Head- normocephalic, atraumatic Eyes-  Sclera clear, conjunctiva pink Ears- hearing intact Oropharynx- clear Neck- supple, no JVP Lymph- no cervical lymphadenopathy Lungs- Clear to ausculation bilaterally, normal work of breathing Heart- Regular rate and rhythm, no murmurs, rubs or gallops, PMI not laterally displaced GI- soft, NT, ND, + BS Extremities- no clubbing, cyanosis, or edema MS- no significant deformity or atrophy Skin- no rash or lesion Psych- euthymic mood, full affect Neuro- strength and sensation are intact  EKG- NSR at 64, pr int 153 bpm, qrs int 74 ms, qtc 416 ms Epic records reviewed Echo-Study Conclusions  - Left ventricle: The cavity size was normal. Wall thickness was   normal. Systolic function was normal. The estimated ejection   fraction was in the range of 60% to 65%. Wall motion was normal;   there were no regional wall motion abnormalities. Left   ventricular diastolic function parameters were normal. - Left atrium: The atrium was normal in size. - Tricuspid valve: There was mild regurgitation. - Pulmonary arteries: PA peak pressure: 37 mm Hg (S). - Inferior vena cava: The vessel was normal in size. The   respirophasic diameter changes were in the normal range (=  50%),   consistent with normal central venous pressure.  Impressions:  - LVEF 60-65%, normal wall thickness, normal wall motion, normal   diastolic function, normal LA size, mild TR, RVSP 37 mmHg, normal   IVC.   Assessment and Plan: 1. New onset afib Pt is now in SR General  education re afib Triggers discussed, avoid caffeine, alcohol Weight loss encouraged Regular exercise encourged Continue on metoprolol 25 mg 1/2 tab bid Chadasvasc  score is 1 for female and will not need anticoagulation at this time Continue baby asa  Referred for sleep study   Holter monitor scheduled 5/22 To be established with Dr. Charlynn Grimes 09/19/17 afib clinic as needed  Geroge Baseman. Abrie Egloff, [facility] [address] [place], [place] [address] [phone]

## 2017-08-30 ENCOUNTER — Telehealth: Payer: Self-pay | Admitting: *Deleted

## 2017-08-30 NOTE — Telephone Encounter (Signed)
Staff message sent to Gae Bon ok to schedule sleep study. Patient has Medicaid and does not need a PA.

## 2017-09-01 ENCOUNTER — Telehealth: Payer: Self-pay | Admitting: *Deleted

## 2017-09-01 NOTE — Telephone Encounter (Signed)
Patient is scheduled for lab study on 09/27/17. Patient understands her sleep study will be done at [facility] sleep lab. Patient understands she will receive a sleep packet in a week or so. Patient understands to call if she does not receive the sleep packet in a timely manner. Patient agrees with treatment and thanked me for call.

## 2017-09-01 NOTE — Telephone Encounter (Signed)
-----   Message from Lauralee Evener, [place] sent at 08/30/2017  8:18 AM EDT ----- Regarding: RE: pre cert Ok to schedule. Patient has medicaid ----- Message ----- From: Freada Bergeron, CMA Sent: 08/29/2017   6:23 PM To: [facility] Sleep Studies Subject: pre cert                                         ----- Message ----- From: Juluis Mire, RN Sent: 08/29/2017  10:02 AM To: Freada Bergeron, CMA Subject: sleep study                                    Pt needs sleep study for obstructive sleep apnea previously diagnosed many years ago. Needs new study for new cpap. Thanks stacy

## 2017-09-07 ENCOUNTER — Ambulatory Visit (INDEPENDENT_AMBULATORY_CARE_PROVIDER_SITE_OTHER): Payer: Medicaid Other

## 2017-09-07 DIAGNOSIS — I480 Paroxysmal atrial fibrillation: Secondary | ICD-10-CM

## 2017-09-19 ENCOUNTER — Encounter: Payer: Self-pay | Admitting: Internal Medicine

## 2017-09-19 ENCOUNTER — Ambulatory Visit: Payer: Medicaid Other | Admitting: Internal Medicine

## 2017-09-19 ENCOUNTER — Other Ambulatory Visit: Payer: Self-pay

## 2017-09-19 DIAGNOSIS — R1013 Epigastric pain: Secondary | ICD-10-CM

## 2017-09-19 DIAGNOSIS — I480 Paroxysmal atrial fibrillation: Secondary | ICD-10-CM | POA: Diagnosis not present

## 2017-09-19 DIAGNOSIS — Z9119 Patient's noncompliance with other medical treatment and regimen: Secondary | ICD-10-CM

## 2017-09-19 DIAGNOSIS — G4733 Obstructive sleep apnea (adult) (pediatric): Secondary | ICD-10-CM

## 2017-09-19 NOTE — Patient Instructions (Signed)
Maria Nelson,  I am sorry that she had to go to the hospital last month.  They found that you were in atrial fibrillation.  At that you had not had any more symptoms since going to the hospital.  You can stop taking the metoprolol and aspirin.  I would like for you to follow-up with a sleep study because that is the major risk factor you have for having gone into that abnormal rhythm.  Please follow-up with [place] in 6 months or earlier if you have any issues

## 2017-09-19 NOTE — Progress Notes (Signed)
   CC: Hospital follow-up for new A. fib  HPI:  [name] is a 62 y.o. female with a past medical history listed below here today for follow up of her recent hospitalization for new onset A. fib.  For details of today's visit and the status of her chronic medical issues please refer to the assessment and plan.   Past Medical History:  Diagnosis Date  . 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 Carpal tunnel syndrome 06/23/2011   Patient notes history of bilateral carpal tunnel syndrome.  Now has symptoms of right hand carpal tunnel.   [name] COPD (chronic obstructive pulmonary disease) ([place])    secondary to tobacco use // No PFTs on file  . ETOH abuse    Pt stopped in 3/08  . Gastrointestinal hemorrhage    secondary to PUD on March 2008  . OSA on CPAP    noncompliant with CPAP (2/2 it being depressing)  . Peptic ulcer disease    +H. pylori (antigen)- Dr. Olevia Perches- treated  . STRESS INCONTINENCE 01/24/2007  . Tobacco abuse    quit in 2010  . Urinary incontinence    Review of Systems:   No chest pain, palpitations, shortness of breath  Physical Exam:  Vitals:   09/19/17 1426  BP: 113/72  Pulse: 66  Temp: 98.4 F (36.9 C)  TempSrc: Oral  SpO2: 95%  Weight: 240 lb 9.6 oz (109.1 kg)  Height: 5' (1.524 m)   GENERAL- alert, co-operative, appears as stated age, not in any distress. HEENT- Atraumatic, normocephalic, PERRL, EOMI, oral mucosa appears moist CARDIAC- RRR, no murmurs, rubs or gallops. RESP- Moving equal volumes of air, and clear to auscultation bilaterally, no wheezes or crackles. ABDOMEN- Soft, nontender, bowel sounds present. NEURO- No obvious Cr N abnormality. EXTREMITIES- pulse 2+, symmetric, no pedal edema. SKIN- Warm, dry, No rash or lesion. PSYCH- Normal mood and affect, appropriate thought content and speech.   Assessment & Plan:   See Encounters Tab for problem based charting.  Patient discussed with Dr. Lynnae January

## 2017-09-20 NOTE — Progress Notes (Signed)
Internal Medicine Clinic Attending  Case discussed with Dr. Boswell at the time of the visit.  We reviewed the resident's history and exam and pertinent patient test results.  I agree with the assessment, diagnosis, and plan of care documented in the resident's note.  

## 2017-09-20 NOTE — Assessment & Plan Note (Signed)
Ms. Spragg was recently hospitalized on 08/24/2017.  She presented with complaints of shortness of breath and was found to have new onset A. fib with RVR.  She quickly converted to normal sinus rhythm after initiation of a diltiazem drip.  She was started on metoprolol 12.5 mg twice daily as well as aspirin 81 mg daily.  Her chads vascwas 1.   Since her hospitalization she reports that she has not had any further symptoms.  She denies any shortness of breath, palpitations, chest pain.  Ports that after starting her new medications she broke out in hives.  She also notes that after starting the baby aspirin she noticed hematuria.  She stopped taking the metoprolol or the aspirin within several days of her discharge.  She is followed up with audiology at the A. fib clinic.  She received a 30-day event monitor.  She reports that after wearing the pads she broke out in a rash so she has not worn the monitor.  She is in normal sinus rhythm today.  Heart rate off of any beta-blockers is 66.  Assessment and plan: This was the patient's first episode of A. fib.  Her only major risk factor is her untreated OSA.  She notes that her mask at home is uncomfortable so she does not wear it.  Her chads vascis only 1 so it is reasonable to not anticoagulate her even with baby aspirin.  As her heart rate is already a goal of any beta-blocker I will stop her metoprolol.  She has an appointment for a new sleep study to have nasal CPAP mask evaluation.  Encouraged her to follow-up on this.  Return precautions given.

## 2017-09-20 NOTE — Assessment & Plan Note (Signed)
Dyspepsia is improved after hospitalization from 08/24/2017.  She is no longer taking sucralfate and is back to daily PPI use.

## 2017-09-20 NOTE — Assessment & Plan Note (Signed)
Patient has follow-up appointment for new sleep study with the goal of better fitting mask that she can tolerate later this month.  Currently she is not compliant with CPAP.

## 2017-09-27 ENCOUNTER — Ambulatory Visit (HOSPITAL_BASED_OUTPATIENT_CLINIC_OR_DEPARTMENT_OTHER): Payer: Medicaid Other | Attending: Nurse Practitioner | Admitting: Cardiology

## 2017-09-27 VITALS — Ht 60.0 in | Wt 240.0 lb

## 2017-09-27 DIAGNOSIS — J449 Chronic obstructive pulmonary disease, unspecified: Secondary | ICD-10-CM | POA: Insufficient documentation

## 2017-09-27 DIAGNOSIS — Z6841 Body Mass Index (BMI) 40.0 and over, adult: Secondary | ICD-10-CM | POA: Diagnosis not present

## 2017-09-27 DIAGNOSIS — G4736 Sleep related hypoventilation in conditions classified elsewhere: Secondary | ICD-10-CM | POA: Insufficient documentation

## 2017-09-27 DIAGNOSIS — E669 Obesity, unspecified: Secondary | ICD-10-CM | POA: Insufficient documentation

## 2017-09-27 DIAGNOSIS — G4733 Obstructive sleep apnea (adult) (pediatric): Secondary | ICD-10-CM | POA: Insufficient documentation

## 2017-09-29 NOTE — Procedures (Signed)
Patient Name: Maria Nelson, Maria Nelson Date: 09/27/2017 Gender: Female D.O.B: 05-18-1955 Age (years): 62 Referring Provider: Sherran Needs Height (inches): 18 Interpreting Physician: Fransico Him MD, ABSM Weight (lbs): 240 RPSGT: Jorge Ny BMI: 29 MRN: [id number] Neck Size: 14.50  CLINICAL INFORMATION Sleep Study Type: Split Night CPAP  Indication for sleep study: COPD, Obesity, OSA, Snoring  Epworth Sleepiness Score: 4  SLEEP STUDY TECHNIQUE As per the AASM Manual for the Scoring of Sleep and Associated Events v2.3 (April 2016) with a hypopnea requiring 4% desaturations.  The channels recorded and monitored were frontal, central and occipital EEG, electrooculogram (EOG), submentalis EMG (chin), nasal and oral airflow, thoracic and abdominal wall motion, anterior tibialis EMG, snore microphone, electrocardiogram, and pulse oximetry. Continuous positive airway pressure (CPAP) was initiated when the patient met split night criteria and was titrated according to treat sleep-disordered breathing.  MEDICATIONS Medications self-administered by patient taken the night of the study : [place] [facility] Total AHI (/hr): 39.0  RDI (/hr):48.5  OA Index (/hr): 7.8  CA Index (/hr): 0.0 REM AHI (/hr): 63.3  NREM AHI (/hr):33.4  Supine AHI (/hr):0.0  Non-supine AHI (/hr):39.45 Min O2 Sat (%):76.0  Mean O2 (%): 91.5  Time below 88% (min):6   Titration Optimal Pressure (cm):15  AHI at Optimal Pressure (/hr):0.0  Min O2 at Optimal Pressure (%):92.0 Supine % at Optimal (%):0  Sleep % at Optimal (%):88   SLEEP ARCHITECTURE The recording time for the entire night was 454.6 minutes.  During a baseline period of 184.8 minutes, the patient slept for 146.0 minutes in REM and nonREM, yielding a sleep efficiency of 79.0%%. Sleep onset after lights out was 4.7 minutes with a REM latency of 141.0 minutes. The patient spent 5.1%% of the night in stage N1  sleep, 76.0%% in stage N2 sleep, 0.0%% in stage N3 and 18.8%% in REM.  During the titration period of 259.6 minutes, the patient slept for 244.5 minutes in REM and nonREM, yielding a sleep efficiency of 94.2%%. Sleep onset after CPAP initiation was 4.7 minutes with a REM latency of 56.0 minutes. The patient spent 3.3%% of the night in stage N1 sleep, 65.8%% in stage N2 sleep, 0.0%% in stage N3 and 30.9%% in REM.  CARDIAC DATA The 2 lead EKG demonstrated sinus rhythm. The mean heart rate was 100.0 beats per minute. Other EKG findings include: None.  LEG MOVEMENT DATA The total Periodic Limb Movements of Sleep (PLMS) were 0. The PLMS index was 0.0 .  IMPRESSIONS - Severe obstructive sleep apnea occurred during the diagnostic portion of the study (AHI = 39.0/hour). An optimal PAP pressure was selected for this patient ( 15 cm of water) - No significant central sleep apnea occurred during the diagnostic portion of the study (CAI = 0.0/hour). - Moderate oxygen desaturation was noted during the diagnostic portion of the study (Min O2 =76.0%). - The patient snored with moderate snoring volume during the diagnostic portion of the study. - No cardiac abnormalities were noted during this study. - Clinically significant periodic limb movements did not occur during sleep.  DIAGNOSIS - Obstructive Sleep Apnea (327.23 [G47.33 ICD-10]) - Nocturnal Hypoxemia  RECOMMENDATIONS - Trial of CPAP therapy on 15 cm H2O with a Medium size Resmed Nasal Mask Airfit N20 mask and heated humidification. - Avoid alcohol, sedatives and other CNS depressants that may worsen sleep apnea and disrupt normal sleep architecture. - Sleep hygiene should be reviewed to assess factors that may improve sleep quality. - Weight management and  regular exercise should be initiated or continued.  [Electronically signed] 09/29/2017 09:28 PM  Fransico Him MD, ABSM Diplomate, American Board of Sleep Medicine

## 2017-10-06 ENCOUNTER — Telehealth: Payer: Self-pay | Admitting: *Deleted

## 2017-10-06 NOTE — Telephone Encounter (Signed)
-----   Message from Maria Margarita, MD sent at 09/29/2017  9:32 PM EDT ----- Please let patient know that they have significant sleep apnea and had successful PAP titration and will be set up with PAP unit.  Please let DME know that order is in EPIC.  Please set patient up for OV in 10 weeks

## 2017-10-06 NOTE — Telephone Encounter (Signed)
Informed patient of sleep study results and patient understanding was verbalized. Patient understands her sleep study showed  that they have significant sleep apnea and had successful PAP titration and will be set up with PAP unit. Upon patient request DME selection is [facility] Patient understands she will be contacted by [facility] to set up her cpap. Patient understands to call if [facility] does not contact her with new setup in a timely manner. Patient understands they will be called once confirmation has been received from [facility] that they have received their new machine to schedule 10 week follow up appointment.  [facility] notified of new cpap order  Please add to airview Patient was grateful for the call and thanked me.

## 2017-10-11 ENCOUNTER — Ambulatory Visit: Payer: Medicaid Other | Admitting: Allergy and Immunology

## 2017-10-11 ENCOUNTER — Encounter: Payer: Self-pay | Admitting: Allergy and Immunology

## 2017-10-11 VITALS — BP 120/78 | HR 60 | Resp 18

## 2017-10-11 DIAGNOSIS — J449 Chronic obstructive pulmonary disease, unspecified: Secondary | ICD-10-CM

## 2017-10-11 DIAGNOSIS — L501 Idiopathic urticaria: Secondary | ICD-10-CM | POA: Diagnosis not present

## 2017-10-11 DIAGNOSIS — T783XXD Angioneurotic edema, subsequent encounter: Secondary | ICD-10-CM

## 2017-10-11 DIAGNOSIS — J3089 Other allergic rhinitis: Secondary | ICD-10-CM

## 2017-10-11 MED ORDER — RANITIDINE HCL 150 MG PO TABS: 150.0000 mg | ORAL_TABLET | Freq: Two times a day (BID) | ORAL | 4 refills | Status: DC

## 2017-10-11 MED ORDER — MONTELUKAST SODIUM 10 MG PO TABS: 10.0000 mg | ORAL_TABLET | Freq: Every day | ORAL | 4 refills | Status: DC

## 2017-10-11 NOTE — Assessment & Plan Note (Signed)
   Montelukast has been prescribed (as above).  For now, continue Dulera 200-5 g, 2 inhalations via spacer twice twice daily, and albuterol HFA, 1 to 2 inhalations every 6 hours if needed and 15 minutes prior to exercise/exertion.  Subjective and objective measures of pulmonary function will be followed and the treatment plan will be adjusted accordingly.

## 2017-10-11 NOTE — Assessment & Plan Note (Addendum)
Lab work was unrevealing on 2 occasions in the past.  Instructions have been discussed and provided for H1/H2 receptor blockade with titration to find lowest effective dose.  A prescription has been provided for ranitidine 150 mg twice daily as needed.  A prescription has been provided for montelukast 10 mg daily at bedtime.  If symptoms persist or progress despite treatment plan as outlined above, we will consider omalizumab (Xolair) injections.

## 2017-10-11 NOTE — Patient Instructions (Addendum)
Urticaria/angioedema Lab work was unrevealing on 2 occasions in the past.  Instructions have been discussed and provided for H1/H2 receptor blockade with titration to find lowest effective dose.  A prescription has been provided for ranitidine 150 mg twice daily as needed.  A prescription has been provided for montelukast 10 mg daily at bedtime.  If symptoms persist or progress despite treatment plan as outlined above, we will consider omalizumab (Xolair) injections.  Asthma with COPD  Montelukast has been prescribed (as above).  For now, continue Dulera 200-5 g, 2 inhalations via spacer twice twice daily, and albuterol HFA, 1 to 2 inhalations every 6 hours if needed and 15 minutes prior to exercise/exertion.  Subjective and objective measures of pulmonary function will be followed and the treatment plan will be adjusted accordingly.  Allergic rhinitis with nonallergic component Stable.  Continue appropriate allergen avoidance measures, fexofenadine (Allegra) 60 mg 1-2 times daily as needed, azelastine nasal spray as needed, and nasal saline irrigation as needed.   Return in about 5 months (around 03/13/2018), or if symptoms worsen or fail to improve.   Urticaria (Hives)  . Fexofenadine (Allegra) 60 mg once a day.  If symptoms continue then increase to .  [name] Fexofenadine (Allegra) 60 mg  twice a day.  If symptoms continue then increase to .  [name] Fexofenadine (Allegra) 60 mg  twice a day and Ranitidine (Zantac) 150 mg once a day.  If symptoms continue then increase to.  [name] Fexofenadine (Allegra) 60 mg  twice a day and Ranitidine (Zantac) 150 mg twice a day  May use Benadryl as needed for breakthrough symptoms       If no symptoms for 7 days, then step down dosage

## 2017-10-11 NOTE — Progress Notes (Signed)
Follow-up Note  RE: Maria Nelson MRN: [id number] DOB: 1955-09-25 Date of Office Visit: 10/11/2017  Primary care provider: Maryellen Pile, MD Referring provider: Maryellen Pile, MD  History of present illness: Maria Nelson is a 61 y.o. female with chronic idiopathic urticaria/angioedema, persistent asthma, and allergic rhinitis presenting today for follow-up.  She reports that despite taking fexofenadine 60 mg daily she is still experiencing urticaria plus/minus angioedema 1 time per week on average. She does not experience concomitant cardiopulmonary or GI symptoms. Despite compliance with Dulera 200-5 g, 2 inhalations via spacer device twice daily, she reports that she has been experiencing more frequent asthma symptoms recently and increased albuterol requirement, particularly with exertion. She has no nasal allergy symptom complaints today.  Assessment and plan: Urticaria/angioedema Lab work was unrevealing on 2 occasions in the past.  Instructions have been discussed and provided for H1/H2 receptor blockade with titration to find lowest effective dose.  A prescription has been provided for ranitidine 150 mg twice daily as needed.  A prescription has been provided for montelukast 10 mg daily at bedtime.  If symptoms persist or progress despite treatment plan as outlined above, we will consider omalizumab (Xolair) injections.  Asthma with COPD  Montelukast has been prescribed (as above).  For now, continue Dulera 200-5 g, 2 inhalations via spacer twice twice daily, and albuterol HFA, 1 to 2 inhalations every 6 hours if needed and 15 minutes prior to exercise/exertion.  Subjective and objective measures of pulmonary function will be followed and the treatment plan will be adjusted accordingly.  Allergic rhinitis with nonallergic component Stable.  Continue appropriate allergen avoidance measures, fexofenadine (Allegra) 60 mg 1-2 times daily as  needed, azelastine nasal spray as needed, and nasal saline irrigation as needed.   Meds ordered this encounter  Medications  . ranitidine (ZANTAC) 150 MG tablet    Sig: Take 1 tablet (150 mg total) by mouth 2 (two) times daily.    Dispense:  60 tablet    Refill:  4  . montelukast (SINGULAIR) 10 MG tablet    Sig: Take 1 tablet (10 mg total) by mouth at bedtime.    Dispense:  30 tablet    Refill:  4    Diagnostics: Spirometry reveals an FVC of 1.82 L and an FEV1 of 1.29 L (72% predicted with a 90% FEV1 ratio.  There was 100 mL (5%) postbronchodilator improvement.  Please see scanned spirometry results for details.    Physical examination: Blood pressure 120/78, pulse 60, resp. rate 18, SpO2 96 %.  General: Alert, interactive, in no acute distress. HEENT: TMs pearly gray, turbinates mildly edematous without discharge, post-pharynx mildly erythematous. Neck: Supple without lymphadenopathy. Lungs: Mildly decreased breath sounds bilaterally without wheezing, rhonchi or rales. CV: Normal S1, S2 without murmurs. Skin: Warm and dry, without lesions or rashes.  The following portions of the patient's history were reviewed and updated as appropriate: allergies, current medications, past family history, past medical history, past social history, past surgical history and problem list.  Allergies as of 10/11/2017      Reactions   Bee Venom Anaphylaxis   Aspirin Nausea Only      Medication List        Accurate as of 10/11/17 12:54 PM. Always use your most recent med list.          albuterol 108 (90 Base) MCG/ACT inhaler Commonly known as:  PROVENTIL HFA;VENTOLIN HFA Inhale 2 puffs into the lungs every 6 (six) hours as needed for wheezing.  albuterol (2.5 MG/3ML) 0.083% nebulizer solution Commonly known as:  PROVENTIL Use 1 vial in nebulizer up to 3 times a day when albuterol inhaler not working. If no better or getting worse, call 911 or physician line   cetirizine 10 MG  tablet Commonly known as:  ZYRTEC Take 1 tablet (10 mg total) by mouth daily.   EPIPEN 2-PAK 0.3 mg/0.3 mL Soaj injection Generic drug:  EPINEPHrine Inject into the muscle as needed (for allergic reaction).   mometasone-formoterol 200-5 MCG/ACT Aero Commonly known as:  DULERA INHALE TWO PUFFS BY MOUTH TWICE DAILY TO PREVENT COUGH OR WHEEZE. RINSE GARGLE AND SPIT AFTER   montelukast 10 MG tablet Commonly known as:  SINGULAIR Take 1 tablet (10 mg total) by mouth at bedtime.   pantoprazole 40 MG tablet Commonly known as:  PROTONIX After leaving hospital, take one tablet twice a day for 1 week. Then go back to taking one tablet every morning. Take medication on empty stomach, at least 30 minutes before eating.   ranitidine 150 MG tablet Commonly known as:  ZANTAC Take 1 tablet (150 mg total) by mouth 2 (two) times daily.       Allergies  Allergen Reactions  . Bee Venom Anaphylaxis  . Aspirin Nausea Only   Review of systems: Review of systems negative except as noted in HPI / PMHx or noted below: Constitutional: Negative.  HENT: Negative.   Eyes: Negative.  Respiratory: Negative.   Cardiovascular: Negative.  Gastrointestinal: Negative.  Genitourinary: Negative.  Musculoskeletal: Negative.  Neurological: Negative.  Endo/Heme/Allergies: Negative.  Cutaneous: Negative.  Past Medical History:  Diagnosis Date  . 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 Asthma   . Carpal tunnel syndrome 06/23/2011   Patient notes history of bilateral carpal tunnel syndrome.  Now has symptoms of right hand carpal tunnel.   [name] COPD (chronic obstructive pulmonary disease) ([place])    secondary to tobacco use // No PFTs on file  . ETOH abuse    Pt stopped in 3/08  . Gastrointestinal hemorrhage    secondary to PUD on March 2008  . OSA on CPAP    noncompliant with CPAP (2/2 it being depressing)  . Peptic ulcer disease    +H. pylori (antigen)- Dr. Olevia Perches- treated  . STRESS INCONTINENCE 01/24/2007  . Tobacco  abuse    quit in 2010  . Urinary incontinence   . Urticaria     Family History  Problem Relation Age of Onset  . Diabetes Sister   . Breast cancer Sister   . Breast cancer Sister 25       died 2/2 breast ca age 76-60  . Diabetes Mother   . Coronary artery disease Mother 59       requiring 5 vessel CABG    Social History   Socioeconomic History  . Marital status: Single    Spouse name: Not on file  . Number of children: 3  . Years of education: 12th grade  . Highest education level: Not on file  Occupational History  . Occupation: Disability    Comment: previously worked in [employer] had to stop 2/2 occupational exposures leading to exacerbations of asthma   Social Needs  . Financial resource strain: Not on file  . Food insecurity:    Worry: Not on file    Inability: Not on file  . Transportation needs:    Medical: Not on file    Non-medical: Not on file  Tobacco Use  . Smoking status: Former Smoker  Packs/day: 1.00    Years: 30.00    Pack years: 30.00    Types: Cigarettes    Last attempt to quit: 12/04/2010    Years since quitting: 6.8  . Smokeless tobacco: Never Used  Substance and Sexual Activity  . Alcohol use: Yes    Alcohol/week: 0.0 oz    Comment: now drinking a beer on weekends //   . Drug use: No  . Sexual activity: Not on file  Lifestyle  . Physical activity:    Days per week: Not on file    Minutes per session: Not on file  . Stress: Not on file  Relationships  . Social connections:    Talks on phone: Not on file    Gets together: Not on file    Attends religious service: Not on file    Active member of club or organization: Not on file    Attends meetings of clubs or organizations: Not on file    Relationship status: Not on file  . Intimate partner violence:    Fear of current or ex partner: Not on file    Emotionally abused: Not on file    Physically abused: Not on file    Forced sexual activity: Not on file  Other Topics Concern  .  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      Pt lives with her son      Pt has received financial assistance approval for 100% discount at [facility] and has [facility] card.      I appreciate the opportunity to take part in Audery's care. Please do not hesitate to contact me with questions.  Sincerely,   R. Edgar Frisk, MD

## 2017-10-11 NOTE — Assessment & Plan Note (Signed)
Stable.  Continue appropriate allergen avoidance measures, fexofenadine (Allegra) 60 mg 1-2 times daily as needed, azelastine nasal spray as needed, and nasal saline irrigation as needed.

## 2017-10-11 NOTE — Assessment & Plan Note (Signed)
>>  ASSESSMENT AND PLAN FOR URTICARIA/ANGIOEDEMA WRITTEN ON 10/11/2017 12:02 PM BY BOBBITT, RALPH CARTER, MD  Lab work was unrevealing on 2 occasions in the past. Instructions have been discussed and provided for H1/H2 receptor blockade with titration to find lowest effective dose. A prescription has been provided for ranitidine 150 mg twice daily as needed. A prescription has been provided for montelukast 10 mg daily at bedtime. If symptoms persist or progress despite treatment plan as outlined above, we will consider omalizumab (Xolair) injections.

## 2017-10-16 ENCOUNTER — Encounter: Payer: Self-pay | Admitting: *Deleted

## 2017-10-17 DIAGNOSIS — G4733 Obstructive sleep apnea (adult) (pediatric): Secondary | ICD-10-CM | POA: Diagnosis not present

## 2017-11-17 DIAGNOSIS — G4733 Obstructive sleep apnea (adult) (pediatric): Secondary | ICD-10-CM | POA: Diagnosis not present

## 2017-11-18 ENCOUNTER — Other Ambulatory Visit: Payer: Self-pay | Admitting: Allergy and Immunology

## 2017-11-18 DIAGNOSIS — J449 Chronic obstructive pulmonary disease, unspecified: Secondary | ICD-10-CM

## 2017-12-12 DIAGNOSIS — G4733 Obstructive sleep apnea (adult) (pediatric): Secondary | ICD-10-CM | POA: Diagnosis not present

## 2017-12-18 DIAGNOSIS — G4733 Obstructive sleep apnea (adult) (pediatric): Secondary | ICD-10-CM | POA: Diagnosis not present

## 2018-01-16 DIAGNOSIS — G4733 Obstructive sleep apnea (adult) (pediatric): Secondary | ICD-10-CM | POA: Diagnosis not present

## 2018-01-17 DIAGNOSIS — G4733 Obstructive sleep apnea (adult) (pediatric): Secondary | ICD-10-CM | POA: Diagnosis not present

## 2018-02-01 NOTE — Telephone Encounter (Signed)
Patient has a 10 week follow up appointment scheduled for 10/28/ 2019 @2  pm. Patient understands she needs to keep this appointment for insurance compliance. Patient was grateful for the call and thanked me.

## 2018-02-13 ENCOUNTER — Ambulatory Visit (INDEPENDENT_AMBULATORY_CARE_PROVIDER_SITE_OTHER): Payer: Medicaid Other | Admitting: Cardiology

## 2018-02-13 ENCOUNTER — Encounter: Payer: Self-pay | Admitting: Cardiology

## 2018-02-13 VITALS — BP 132/76 | HR 66 | Ht 60.0 in | Wt 239.8 lb

## 2018-02-13 DIAGNOSIS — G4733 Obstructive sleep apnea (adult) (pediatric): Secondary | ICD-10-CM

## 2018-02-13 DIAGNOSIS — Z6841 Body Mass Index (BMI) 40.0 and over, adult: Secondary | ICD-10-CM | POA: Diagnosis not present

## 2018-02-13 DIAGNOSIS — I480 Paroxysmal atrial fibrillation: Secondary | ICD-10-CM

## 2018-02-13 NOTE — Progress Notes (Signed)
Cardiology Office Note:    Date:  02/13/2018   ID:  Maria Nelson, DOB 10-22-1955, MRN [id number]  PCP:  Mike Craze, DO  Cardiologist:  No primary care provider on file.    Referring MD: Mike Craze, DO   Chief Complaint  Patient presents with  . Sleep Apnea    History of Present Illness:    Maria Nelson is a 62 y.o. female with a hx of COPD and atrial fibrillation who was referred by Doristine Devoid, NP for split-night Sleep study due to snoring and history of sleep apnea in the past but was noncompliant at that time.  Epworth sleepiness score is 4.  She underwent sleep study on 09/27/2017 showing severe obstructive sleep apnea with an AHI of 39/h and no significant central sleep apnea.  She had moderate oxygen desaturations as low as 76% with moderate snoring.  She was noted to have nocturnal hypoxemia as well.  She was titrated to CPAP therapy at 15 cm H2O. She does admit to having problems with excessive daytime sleepiness and after she has eaten lunch she will easily fall asleep if she sits down.  She does not know if she snores but her grandchildren have told her that she does. She has not been intolerant of the nasal mask.  She thinks she has a DreamWear mask but does not tolerate the straps around her head.  She has not been compliant with using more than 4 hours nightly.  She understands that she needs to be compliant in order to keep her device.  She tolerates the pressure fine.  Past Medical History:  Diagnosis Date  . 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 Asthma   . Carpal tunnel syndrome 06/23/2011   Patient notes history of bilateral carpal tunnel syndrome.  Now has symptoms of right hand carpal tunnel.   [name] COPD (chronic obstructive pulmonary disease) ([place])    secondary to tobacco use // No PFTs on file  . ETOH abuse    Pt stopped in 3/08  . Gastrointestinal hemorrhage    secondary to PUD on March 2008  . OSA on CPAP    noncompliant with CPAP (2/2 it being  depressing)  . Peptic ulcer disease    +H. pylori (antigen)- Dr. Olevia Perches- treated  . STRESS INCONTINENCE 01/24/2007  . Tobacco abuse    quit in 2010  . Urinary incontinence   . Urticaria     Past Surgical History:  Procedure Laterality Date  . BREAST EXCISIONAL BIOPSY Right 1994  . MANDIBLE SURGERY     due to trauma  . TUBAL LIGATION      Current Medications: Current Meds  Medication Sig  . albuterol (PROVENTIL HFA;VENTOLIN HFA) 108 (90 BASE) MCG/ACT inhaler Inhale 2 puffs into the lungs every 6 (six) hours as needed for wheezing.  [name] albuterol (PROVENTIL) (2.5 MG/3ML) 0.083% nebulizer solution Use 1 vial in nebulizer up to 3 times a day when albuterol inhaler not working. If no better or getting worse, call 911 or physician line  . cetirizine (ZYRTEC) 10 MG tablet Take 1 tablet (10 mg total) by mouth daily.  [name] EPINEPHrine (EPIPEN 2-PAK) 0.3 mg/0.3 mL IJ SOAJ injection Inject into the muscle as needed (for allergic reaction).   . mometasone-formoterol (DULERA) 200-5 MCG/ACT AERO INHALE 2 PUFFS BY MOUTH TWICE DAILY TO  PREVENT  COUGH  OR  WHEEZE.  RINSE  GARGLE  AND  SPIT  AFTER  . pantoprazole (PROTONIX) 40 MG tablet After leaving hospital, take one  tablet twice a day for 1 week. Then go back to taking one tablet every morning. Take medication on empty stomach, at least 30 minutes before eating.     Allergies:   Bee venom and Aspirin   Social History   Socioeconomic History  . Marital status: Single    Spouse name: Not on file  . Number of children: 3  . Years of education: 12th grade  . Highest education level: Not on file  Occupational History  . Occupation: Disability    Comment: previously worked in [employer] had to stop 2/2 occupational exposures leading to exacerbations of asthma   Social Needs  . Financial resource strain: Not on file  . Food insecurity:    Worry: Not on file    Inability: Not on file  . Transportation needs:    Medical: Not on file     Non-medical: Not on file  Tobacco Use  . Smoking status: Former Smoker    Packs/day: 1.00    Years: 30.00    Pack years: 30.00    Types: Cigarettes    Last attempt to quit: 12/04/2010    Years since quitting: 7.2  . Smokeless tobacco: Never Used  Substance and Sexual Activity  . Alcohol use: Yes    Alcohol/week: 0.0 standard drinks    Comment: now drinking a beer on weekends //   . Drug use: No  . Sexual activity: Not on file  Lifestyle  . Physical activity:    Days per week: Not on file    Minutes per session: Not on file  . Stress: Not on file  Relationships  . Social connections:    Talks on phone: Not on file    Gets together: Not on file    Attends religious service: Not on file    Active member of club or organization: Not on file    Attends meetings of clubs or organizations: Not on file    Relationship status: Not on file  Other Topics Concern  . 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      Pt lives with her son      Pt has received financial assistance approval for 100% discount at [facility] and has [facility] card.      Family History: The patient's family history includes Breast cancer in her sister; Breast cancer (age of onset: 66) in her sister; Coronary artery disease (age of onset: 5) in her mother; Diabetes in her mother and sister.  ROS:   Please see the history of present illness.    ROS  All other systems reviewed and negative.   EKGs/Labs/Other Studies Reviewed:    The following studies were reviewed today: PAP download  EKG:  EKG is not ordered today.    Recent Labs: 08/24/2017: BUN 9; Creatinine, Ser 1.12; Hemoglobin 13.8; Magnesium 2.1; Platelets 212; Potassium 3.5; Sodium 137; TSH 2.124   Recent Lipid Panel    Component Value Date/Time   CHOL 185 09/09/2012 0540   TRIG 55 09/09/2012 0540   HDL 81 09/09/2012 0540   CHOLHDL 2.3 09/09/2012 0540   VLDL 11 09/09/2012  0540   LDLCALC 93 09/09/2012 0540    Physical Exam:    VS:  BP 132/76   Pulse 66   Ht 5' (1.524 m)   Wt 239 lb 12.8 oz (108.8 kg)   SpO2 96%   BMI 46.83 kg/m  Wt Readings from Last 3 Encounters:  02/13/18 239 lb 12.8 oz (108.8 kg)  09/27/17 240 lb (108.9 kg)  09/19/17 240 lb 9.6 oz (109.1 kg)     GEN:  Well nourished, well developed in no acute distress HEENT: Normal NECK: No JVD; No carotid bruits LYMPHATICS: No lymphadenopathy CARDIAC: RRR, no murmurs, rubs, gallops RESPIRATORY:  Clear to auscultation without rales, wheezing or rhonchi  ABDOMEN: Soft, non-tender, non-distended MUSCULOSKELETAL:  No edema; No deformity  SKIN: Warm and dry NEUROLOGIC:  Alert and oriented x 3 PSYCHIATRIC:  Normal affect   ASSESSMENT:    1. Obstructive sleep apnea syndrome   2. Paroxysmal atrial fibrillation (HCC)   3. Morbid obesity with BMI of 45.0-49.9, adult ([place])    PLAN:    In order of problems listed above:  1.  OSA - the patient has been having a lot of trouble tolerating her mask because she does not like the feel of the straps on her head.  The PAP download was reviewed today and showed an AHI of 0/hr on 15 cm H2O with 0% compliance in using more than 4 hours nightly.  I have encouraged her to be more compliant with her device and try to use it at least 4 hours every night that she meets criteria for compliance.  She would like to continue working with the mask she has and try to be more compliant.  I will see her back in 3 months to see how she is doing.  2.  Paroxysmal atrial fibrillation -she appears to maintain normal sinus rhythm on exam today.  Her CHADS2VASC score is only 1 and therefore is not on any coagulation at this time.  3.  Morbid obesity - I have encouraged her to get into a routine exercise program and cut back on carbs and portions.    Medication Adjustments/Labs and Tests Ordered: Current medicines are reviewed at length with the patient today.  Concerns  regarding medicines are outlined above.  No orders of the defined types were placed in this encounter.  No orders of the defined types were placed in this encounter.   Signed, Fransico Him, MD  02/13/2018 2:24 PM    [place]

## 2018-02-13 NOTE — Patient Instructions (Signed)
Medication Instructions:  Your physician recommends that you continue on your current medications as directed. Please refer to the Current Medication list given to you today.  If you need a refill on your cardiac medications before your next appointment, please call your pharmacy.   Lab work:  If you have labs (blood work) drawn today and your tests are completely normal, you will receive your results only by: [name] MyChart Message (if you have MyChart) OR . A paper copy in the mail If you have any lab test that is abnormal or we need to change your treatment, we will call you to review the results.  Follow-Up: 3 months with Dr. Radford Pax

## 2018-02-17 DIAGNOSIS — G4733 Obstructive sleep apnea (adult) (pediatric): Secondary | ICD-10-CM | POA: Diagnosis not present

## 2018-02-27 ENCOUNTER — Ambulatory Visit: Payer: Medicaid Other | Admitting: Allergy and Immunology

## 2018-02-27 ENCOUNTER — Encounter: Payer: Self-pay | Admitting: Allergy and Immunology

## 2018-02-27 VITALS — BP 118/84 | HR 61 | Resp 18 | Ht 60.0 in | Wt 242.0 lb

## 2018-02-27 DIAGNOSIS — J449 Chronic obstructive pulmonary disease, unspecified: Secondary | ICD-10-CM | POA: Diagnosis not present

## 2018-02-27 DIAGNOSIS — L501 Idiopathic urticaria: Secondary | ICD-10-CM

## 2018-02-27 DIAGNOSIS — J3089 Other allergic rhinitis: Secondary | ICD-10-CM

## 2018-02-27 MED ORDER — ALBUTEROL SULFATE (2.5 MG/3ML) 0.083% IN NEBU: INHALATION_SOLUTION | RESPIRATORY_TRACT | 3 refills | Status: DC

## 2018-02-27 MED ORDER — MONTELUKAST SODIUM 10 MG PO TABS: 10.0000 mg | ORAL_TABLET | Freq: Every day | ORAL | 5 refills | Status: DC

## 2018-02-27 MED ORDER — AZELASTINE HCL 0.15 % NA SOLN: 2.0000 | Freq: Every day | NASAL | 5 refills | Status: DC | PRN

## 2018-02-27 NOTE — Assessment & Plan Note (Signed)
Currently with suboptimal control, we will step up therapy at this time.  A prescription has been provided for montelukast 10 mg daily at bedtime.  For now, continue Dulera 200-5 g, 2 inhalations via spacer twice twice daily, and albuterol HFA, 1 to 2 inhalations every 6 hours if needed and 15 minutes prior to exercise/exertion.  Subjective and objective measures of pulmonary function will be followed and the treatment plan will be adjusted accordingly.

## 2018-02-27 NOTE — Assessment & Plan Note (Signed)
   Instructions have been discussed and provided for H1/H2 receptor blockade with titration to find lowest effective dose.  Montelukast has been prescribed (as above).

## 2018-02-27 NOTE — Patient Instructions (Addendum)
Asthma with COPD Currently with suboptimal control, we will step up therapy at this time.  A prescription has been provided for montelukast 10 mg daily at bedtime.  For now, continue Dulera 200-5 g, 2 inhalations via spacer twice twice daily, and albuterol HFA, 1 to 2 inhalations every 6 hours if needed and 15 minutes prior to exercise/exertion.  Subjective and objective measures of pulmonary function will be followed and the treatment plan will be adjusted accordingly.  Urticaria  Instructions have been discussed and provided for H1/H2 receptor blockade with titration to find lowest effective dose.  Montelukast has been prescribed (as above).  Allergic rhinitis with nonallergic component Stable.  Continue appropriate allergen avoidance measures, fexofenadine (Allegra) 60 mg as needed, azelastine nasal spray as needed, and nasal saline irrigation as needed.   Return in about 5 months (around 07/29/2018), or if symptoms worsen or fail to improve.  Urticaria (Hives)  . Fexofenadine (Allegra) 60 mg once a day.  If symptoms continue then increase to .  [name] Fexofenadine (Allegra) 60 mg  twice a day.  If symptoms continue then increase to .  [name] Fexofenadine (Allegra) 60 mg  twice a day and famotidine (Pepcid) 20 mg once a day.  If symptoms continue then increase to.  [name] Fexofenadine (Allegra) 60 mg  twice a day and famotidine (Pepcid) 20 mg twice a day  May use Benadryl as needed for breakthrough symptoms       If no symptoms for 7 days, then step down dosage

## 2018-02-27 NOTE — Progress Notes (Signed)
Follow-up Note  RE: Maria Nelson MRN: [id number] DOB: Apr 07, 1956 Date of Office Visit: 02/27/2018  Primary care provider: Mike Craze, DO Referring provider: Maryellen Pile, MD  History of present illness: Maria Nelson is a 62 y.o. female with persistent asthma, idiopathic urticaria/angioedema, and allergic rhinitis presenting today for follow-up.  She was last seen in this clinic on October 11, 2017.  She reports that she continues to develop generalized urticaria 2 times per month on average.  She denies concomitant angioedema, cardiopulmonary symptoms, and/or gastrointestinal symptoms.  No specific medication, food, skin care product, detergent, soap, or other environmental triggers have been identified.  She reports that she experiences asthma symptoms requiring albuterol rescue 1 time per week on average, however is significantly modified her level of exertion so she does not require albuterol more frequently.  She is currently taking Dulera 200-5 g, 2 elations via spacer device twice daily.  She admits that she is no longer taking montelukast.  She has no nasal allergy symptom complaints today.  Assessment and plan: Asthma with COPD Currently with suboptimal control, we will step up therapy at this time.  A prescription has been provided for montelukast 10 mg daily at bedtime.  For now, continue Dulera 200-5 g, 2 inhalations via spacer twice twice daily, and albuterol HFA, 1 to 2 inhalations every 6 hours if needed and 15 minutes prior to exercise/exertion.  Subjective and objective measures of pulmonary function will be followed and the treatment plan will be adjusted accordingly.  Urticaria  Instructions have been discussed and provided for H1/H2 receptor blockade with titration to find lowest effective dose.  Montelukast has been prescribed (as above).  Allergic rhinitis with nonallergic component Stable.  Continue appropriate allergen avoidance  measures, fexofenadine (Allegra) 60 mg as needed, azelastine nasal spray as needed, and nasal saline irrigation as needed.   Meds ordered this encounter  Medications  . albuterol (PROVENTIL) (2.5 MG/3ML) 0.083% nebulizer solution    Sig: Use 1 vial in nebulizer up to 3 times a day when albuterol inhaler not working. If no better or getting worse, call 911 or physician line    Dispense:  75 mL    Refill:  3  . montelukast (SINGULAIR) 10 MG tablet    Sig: Take 1 tablet (10 mg total) by mouth at bedtime.    Dispense:  30 tablet    Refill:  5  . Azelastine HCl 0.15 % SOLN    Sig: Place 2 sprays into both nostrils daily as needed.    Dispense:  30 mL    Refill:  5    Diagnostics: Spirometry reveals an FVC of 1.99 L and an FEV1 of 1.28 L (76% predicted) with 100 mL (8%) postbronchodilator improvement.  This study was performed while the patient was asymptomatic.  Please see scanned spirometry results for details.    Physical examination: Blood pressure 118/84, pulse 61, resp. rate 18, height 5' (1.524 m), weight 242 lb (109.8 kg), SpO2 96 %.  General: Alert, interactive, in no acute distress. HEENT: TMs pearly gray, turbinates mildly edematous without discharge, post-pharynx mildly erythematous. Neck: Supple without lymphadenopathy. Lungs: Clear to auscultation without wheezing, rhonchi or rales. CV: Normal S1, S2 without murmurs. Skin: Warm and dry, without lesions or rashes.  The following portions of the patient's history were reviewed and updated as appropriate: allergies, current medications, past family history, past medical history, past social history, past surgical history and problem list.  Allergies as of 02/27/2018  Reactions   Bee Venom Anaphylaxis   Aspirin Nausea Only      Medication List        Accurate as of 02/27/18 12:40 PM. Always use your most recent med list.          albuterol 108 (90 Base) MCG/ACT inhaler Commonly known as:  PROVENTIL  HFA;VENTOLIN HFA Inhale 2 puffs into the lungs every 6 (six) hours as needed for wheezing.   albuterol (2.5 MG/3ML) 0.083% nebulizer solution Commonly known as:  PROVENTIL Use 1 vial in nebulizer up to 3 times a day when albuterol inhaler not working. If no better or getting worse, call 911 or physician line   Azelastine HCl 0.15 % Soln Place 2 sprays into both nostrils daily as needed.   cetirizine 10 MG tablet Commonly known as:  ZYRTEC Take 1 tablet (10 mg total) by mouth daily.   EPIPEN 2-PAK 0.3 mg/0.3 mL Soaj injection Generic drug:  EPINEPHrine Inject into the muscle as needed (for allergic reaction).   mometasone-formoterol 200-5 MCG/ACT Aero Commonly known as:  DULERA INHALE 2 PUFFS BY MOUTH TWICE DAILY TO  PREVENT  COUGH  OR  WHEEZE.  RINSE  GARGLE  AND  SPIT  AFTER   montelukast 10 MG tablet Commonly known as:  SINGULAIR Take 1 tablet (10 mg total) by mouth at bedtime.   pantoprazole 40 MG tablet Commonly known as:  PROTONIX After leaving hospital, take one tablet twice a day for 1 week. Then go back to taking one tablet every morning. Take medication on empty stomach, at least 30 minutes before eating.       Allergies  Allergen Reactions  . Bee Venom Anaphylaxis  . Aspirin Nausea Only   Review of systems: Review of systems negative except as noted in HPI / PMHx or noted below: Constitutional: Negative.  HENT: Negative.   Eyes: Negative.  Respiratory: Negative.   Cardiovascular: Negative.  Gastrointestinal: Negative.  Genitourinary: Negative.  Musculoskeletal: Negative.  Neurological: Negative.  Endo/Heme/Allergies: Negative.  Cutaneous: Negative.  Past Medical History:  Diagnosis Date  . 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 Asthma   . Carpal tunnel syndrome 06/23/2011   Patient notes history of bilateral carpal tunnel syndrome.  Now has symptoms of right hand carpal tunnel.   [name] COPD (chronic obstructive pulmonary disease) ([place])    secondary to tobacco use // No  PFTs on file  . ETOH abuse    Pt stopped in 3/08  . Gastrointestinal hemorrhage    secondary to PUD on March 2008  . OSA on CPAP    noncompliant with CPAP (2/2 it being depressing)  . Peptic ulcer disease    +H. pylori (antigen)- Dr. Olevia Perches- treated  . STRESS INCONTINENCE 01/24/2007  . Tobacco abuse    quit in 2010  . Urinary incontinence   . Urticaria     Family History  Problem Relation Age of Onset  . Diabetes Sister   . Breast cancer Sister   . Breast cancer Sister 54       died 2/2 breast ca age 30-60  . Diabetes Mother   . Coronary artery disease Mother 63       requiring 5 vessel CABG    Social History   Socioeconomic History  . Marital status: Single    Spouse name: Not on file  . Number of children: 3  . Years of education: 12th grade  . Highest education level: Not on file  Occupational History  . Occupation: Disability  Comment: previously worked in [employer] had to stop 2/2 occupational exposures leading to exacerbations of asthma   Social Needs  . Financial resource strain: Not on file  . Food insecurity:    Worry: Not on file    Inability: Not on file  . Transportation needs:    Medical: Not on file    Non-medical: Not on file  Tobacco Use  . Smoking status: Former Smoker    Packs/day: 1.00    Years: 30.00    Pack years: 30.00    Types: Cigarettes    Last attempt to quit: 12/04/2010    Years since quitting: 7.2  . Smokeless tobacco: Never Used  Substance and Sexual Activity  . Alcohol use: Yes    Alcohol/week: 0.0 standard drinks    Comment: now drinking a beer on weekends //   . Drug use: No  . Sexual activity: Not on file  Lifestyle  . Physical activity:    Days per week: Not on file    Minutes per session: Not on file  . Stress: Not on file  Relationships  . Social connections:    Talks on phone: Not on file    Gets together: Not on file    Attends religious service: Not on file    Active member of club or organization: Not on  file    Attends meetings of clubs or organizations: Not on file    Relationship status: Not on file  . Intimate partner violence:    Fear of current or ex partner: Not on file    Emotionally abused: Not on file    Physically abused: Not on file    Forced sexual activity: Not on file  Other Topics Concern  . 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     I appreciate the opportunity to take part in Loralie's care. Please do not hesitate to contact me with questions.  Sincerely,   R. Edgar Frisk, MD

## 2018-02-27 NOTE — Assessment & Plan Note (Signed)
Stable.  Continue appropriate allergen avoidance measures, fexofenadine (Allegra) 60 mg as needed, azelastine nasal spray as needed, and nasal saline irrigation as needed.

## 2018-02-27 NOTE — Assessment & Plan Note (Signed)
>>  ASSESSMENT AND PLAN FOR URTICARIA WRITTEN ON 02/27/2018 11:46 AM BY BOBBITT, RALPH CARTER, MD  Instructions have been discussed and provided for H1/H2 receptor blockade with titration to find lowest effective dose. Montelukast has been prescribed (as above).

## 2018-03-09 ENCOUNTER — Telehealth: Payer: Self-pay | Admitting: *Deleted

## 2018-03-09 MED ORDER — ASTEPRO 0.15 % NA SOLN: NASAL | 5 refills | Status: DC

## 2018-03-09 NOTE — Telephone Encounter (Signed)
Received fax for PA for azelastine 0.15 resent rx for name brand per insurance preference

## 2018-03-19 DIAGNOSIS — G4733 Obstructive sleep apnea (adult) (pediatric): Secondary | ICD-10-CM | POA: Diagnosis not present

## 2018-04-01 ENCOUNTER — Other Ambulatory Visit: Payer: Self-pay | Admitting: Internal Medicine

## 2018-04-24 DIAGNOSIS — G4733 Obstructive sleep apnea (adult) (pediatric): Secondary | ICD-10-CM | POA: Diagnosis not present

## 2018-05-09 ENCOUNTER — Encounter: Payer: Self-pay | Admitting: Cardiology

## 2018-05-10 ENCOUNTER — Ambulatory Visit: Payer: Medicaid Other | Admitting: Internal Medicine

## 2018-05-10 ENCOUNTER — Other Ambulatory Visit: Payer: Self-pay

## 2018-05-10 ENCOUNTER — Encounter: Payer: Self-pay | Admitting: Internal Medicine

## 2018-05-10 VITALS — BP 126/89 | HR 55 | Temp 97.5°F | Ht 60.0 in | Wt 244.2 lb

## 2018-05-10 DIAGNOSIS — Z23 Encounter for immunization: Secondary | ICD-10-CM | POA: Diagnosis not present

## 2018-05-10 DIAGNOSIS — G4730 Sleep apnea, unspecified: Secondary | ICD-10-CM | POA: Diagnosis not present

## 2018-05-10 DIAGNOSIS — R21 Rash and other nonspecific skin eruption: Secondary | ICD-10-CM | POA: Diagnosis not present

## 2018-05-10 DIAGNOSIS — J449 Chronic obstructive pulmonary disease, unspecified: Secondary | ICD-10-CM

## 2018-05-10 DIAGNOSIS — Z9989 Dependence on other enabling machines and devices: Secondary | ICD-10-CM

## 2018-05-10 DIAGNOSIS — Z7951 Long term (current) use of inhaled steroids: Secondary | ICD-10-CM

## 2018-05-10 DIAGNOSIS — Z9119 Patient's noncompliance with other medical treatment and regimen: Secondary | ICD-10-CM

## 2018-05-10 DIAGNOSIS — Z79899 Other long term (current) drug therapy: Secondary | ICD-10-CM

## 2018-05-10 DIAGNOSIS — Z8679 Personal history of other diseases of the circulatory system: Secondary | ICD-10-CM

## 2018-05-10 DIAGNOSIS — G4733 Obstructive sleep apnea (adult) (pediatric): Secondary | ICD-10-CM

## 2018-05-10 DIAGNOSIS — J439 Emphysema, unspecified: Secondary | ICD-10-CM

## 2018-05-10 NOTE — Progress Notes (Addendum)
   CC: Sleep apnea, rash  HPI:  [name] is a 63 y.o. with a PMHx listed below presenting for follow up regarding her sleep apnea. She has also been feeling short of breath when she walks outside in the cold and has a rash on her bilateral UE.   For details of today's visit and the status of his chronic medical issues please refer to the assessment and plan.   Past Medical History:  Diagnosis Date  . 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 Asthma   . Carpal tunnel syndrome 06/23/2011   Patient notes history of bilateral carpal tunnel syndrome.  Now has symptoms of right hand carpal tunnel.   [name] COPD (chronic obstructive pulmonary disease) ([place])    secondary to tobacco use // No PFTs on file  . ETOH abuse    Pt stopped in 3/08  . Gastrointestinal hemorrhage    secondary to PUD on March 2008  . OSA on CPAP    noncompliant with CPAP (2/2 it being depressing)  . Peptic ulcer disease    +H. pylori (antigen)- Dr. Olevia Perches- treated  . STRESS INCONTINENCE 01/24/2007  . Tobacco abuse    quit in 2010  . Urinary incontinence   . Urticaria    Review of Systems:   Review of Systems  Constitutional: Negative for chills, fever and weight loss.  Respiratory: Positive for shortness of breath.   Cardiovascular: Negative for palpitations, orthopnea and leg swelling.  Skin: Positive for itching and rash.     Physical Exam:  There were no vitals filed for this visit.  Physical Exam Constitutional:      Appearance: She is obese.  Cardiovascular:     Rate and Rhythm: Normal rate and regular rhythm.     Heart sounds: Normal heart sounds. No murmur.  Pulmonary:     Effort: Pulmonary effort is normal. No respiratory distress.     Breath sounds: Normal breath sounds. No wheezing or rales.  Abdominal:     General: Abdomen is flat. Bowel sounds are normal. There is no distension.     Palpations: Abdomen is soft.     Tenderness: There is no abdominal tenderness.  Musculoskeletal:      General: No swelling.     Right lower leg: No edema.     Left lower leg: No edema.  Skin:    General: Skin is warm and dry.     Findings: Rash present.  Neurological:     Mental Status: She is alert and oriented to person, place, and time.  Psychiatric:        Mood and Affect: Mood normal.        Behavior: Behavior normal.        Thought Content: Thought content normal.        Judgment: Judgment normal.      Assessment & Plan:   See Encounters Tab for problem based charting.  Patient discussed with Dr. Evette Doffing

## 2018-05-10 NOTE — Patient Instructions (Addendum)
Ms. Peitz,  It was a pleasure meeting you today! I want you to try to continue using your CPAP as often as your can for at least 4 hours a night. I want you to try contacting the company where you got your CPAP machine. If they are unable to help you with a new mask I want you to let me know.   Also, please follow up with your allergist for possible food allergy testing. If your rash progresses or causes any breathing difficulties I want you to see your allergist and/or go to the ED.  We will follow up in 6 months! Thanks, Dr. Laural Golden

## 2018-05-11 DIAGNOSIS — R21 Rash and other nonspecific skin eruption: Secondary | ICD-10-CM | POA: Insufficient documentation

## 2018-05-11 NOTE — Progress Notes (Signed)
Internal Medicine Clinic Attending  Case discussed with Dr. Rehman at the time of the visit.  We reviewed the resident's history and exam and pertinent patient test results.  I agree with the assessment, diagnosis, and plan of care documented in the resident's note.  

## 2018-05-11 NOTE — Assessment & Plan Note (Signed)
>>  ASSESSMENT AND PLAN FOR COPD (CHRONIC OBSTRUCTIVE PULMONARY DISEASE) ([place]) WRITTEN ON 05/11/2018 11:44 AM BY REHMAN, AREEG N, DO  Patient has noticed dyspnea when she walks out in the cold. She states her sister lives four houses down from her and when she walks to her sister's house she becomes more short of breath than her usual and requires breathing treatment. She does not notice these symptoms when she is active inside her home. Denies any cough, wheezing or fevers.  Echo from 5/19 showed LV EF of 60-65% with no regional wall abnormalities. Most likely related to weather changes but will continue to monitor.  Plan: - continue Dulera and albuterol - If symptoms persist or worsen follow up for possible echo

## 2018-05-11 NOTE — Assessment & Plan Note (Signed)
Patient reports she has only been using her CPAP 2x a week. She said it is difficult to sleep with the mask. If she sleeps sitting up she is able to use her CPAP for longer. She was informed it is really important that she use this nightly as her atrial fibrillation was triggered from her OSA. She expressed understanding. She was also informed she could get fitted for a different mask or a mask with nasal prongs. She was informed to call the home health company to be refitted.   Plan:  - follow up with her home health company for a mask refitting

## 2018-05-11 NOTE — Assessment & Plan Note (Addendum)
Patient reports a few day history of bilateral UE rash and itchiness. She a few scattered papular lesions on both arms that are erythematous. She noticed these lesions come up after she started using a new bath gel. She was advised to stop this gel and see if her symptoms improve and to try a benadryl cream. She has a history of urticaria and follows with an allergist. If her symptoms persist she was advised to see her allergist.  Plan:  - stop using the new bath gel - benadryl cream prn - follow up with allergist

## 2018-05-11 NOTE — Assessment & Plan Note (Addendum)
Patient has noticed dyspnea when she walks out in the cold. She states her sister lives four houses down from her and when she walks to her sister's house she becomes more short of breath than her usual and requires breathing treatment. She does not notice these symptoms when she is active inside her home. Denies any cough, wheezing or fevers.  Echo from 5/19 showed LV EF of 60-65% with no regional wall abnormalities. Most likely related to weather changes but will continue to monitor.  Plan: - continue Dulera and albuterol - If symptoms persist or worsen follow up for possible echo

## 2018-05-26 ENCOUNTER — Encounter: Payer: Self-pay | Admitting: Cardiology

## 2018-05-26 ENCOUNTER — Ambulatory Visit: Payer: Medicaid Other | Admitting: Cardiology

## 2018-05-26 ENCOUNTER — Telehealth: Payer: Self-pay | Admitting: *Deleted

## 2018-05-26 VITALS — BP 110/68 | HR 73 | Ht 60.0 in | Wt 241.4 lb

## 2018-05-26 DIAGNOSIS — I480 Paroxysmal atrial fibrillation: Secondary | ICD-10-CM

## 2018-05-26 DIAGNOSIS — R0602 Shortness of breath: Secondary | ICD-10-CM | POA: Diagnosis not present

## 2018-05-26 DIAGNOSIS — G4733 Obstructive sleep apnea (adult) (pediatric): Secondary | ICD-10-CM | POA: Diagnosis not present

## 2018-05-26 DIAGNOSIS — Z6841 Body Mass Index (BMI) 40.0 and over, adult: Secondary | ICD-10-CM | POA: Diagnosis not present

## 2018-05-26 NOTE — Patient Instructions (Signed)
Medication Instructions:  Your physician recommends that you continue on your current medications as directed. Please refer to the Current Medication list given to you today.  If you need a refill on your cardiac medications before your next appointment, please call your pharmacy.   Lab work: None If you have labs (blood work) drawn today and your tests are completely normal, you will receive your results only by: [name] MyChart Message (if you have MyChart) OR . A paper copy in the mail If you have any lab test that is abnormal or we need to change your treatment, we will call you to review the results.  Testing/Procedures: Your physician has requested that you have en exercise stress myoview. For further information please visit [email or web address]. Please follow instruction sheet, as given.  Follow-Up:Your physician recommends that you schedule a follow-up appointment in: 3 months with Dr. Radford Pax.

## 2018-05-26 NOTE — Telephone Encounter (Signed)
Order placed to [facility] via fax 

## 2018-05-26 NOTE — Telephone Encounter (Signed)
-----   Message from Sarina Ill, RN sent at 05/26/2018 12:47 PM EST ----- Regarding: Sleep Hello, Dr. Radford Pax ordered a Respironics Dreamwear under the nose nasal mask and chin strap and a download in 6 weeks. Orders placed, please send. Thanks, [employer]

## 2018-05-26 NOTE — Progress Notes (Signed)
Cardiology Office Note:    Date:  05/26/2018   ID:  Maria Nelson, DOB 1956-04-17, MRN [id number]  PCP:  Maria Craze, DO  Cardiologist:  No primary care provider on file.    Referring MD: Maria Craze, DO   Chief Complaint  Patient presents with  . Sleep Apnea  . Atrial Fibrillation    History of Present Illness:    Maria Nelson is a 63 y.o. female with a hx of severe obstructive sleep apnea with an AHI of 39/h and no significant central sleep apnea.  She had moderate oxygen desaturations as low as 76% with moderate snoring.  She was noted to have nocturnal hypoxemia as well.  She was titrated to CPAP therapy at 15 cm H2O.  She had been doing well with her CPAP device but now is having problems with the mask.  She says the mask is very annoying on her face.  She has a DreamWear full facemask.  She would like to try a nasal mask.  She does feel that the pressure is adequate.    Past Medical History:  Diagnosis Date  . 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 Asthma   . Carpal tunnel syndrome 06/23/2011   Patient notes history of bilateral carpal tunnel syndrome.  Now has symptoms of right hand carpal tunnel.   [name] COPD (chronic obstructive pulmonary disease) ([place])    secondary to tobacco use // No PFTs on file  . ETOH abuse    Pt stopped in 3/08  . Gastrointestinal hemorrhage    secondary to PUD on March 2008  . OSA on CPAP    noncompliant with CPAP (2/2 it being depressing)  . Peptic ulcer disease    +H. pylori (antigen)- Dr. Olevia Perches- treated  . STRESS INCONTINENCE 01/24/2007  . Tobacco abuse    quit in 2010  . Urinary incontinence   . Urticaria     Past Surgical History:  Procedure Laterality Date  . BREAST EXCISIONAL BIOPSY Right 1994  . MANDIBLE SURGERY     due to trauma  . TUBAL LIGATION      Current Medications: Current Meds  Medication Sig  . albuterol (PROVENTIL HFA;VENTOLIN HFA) 108 (90 BASE) MCG/ACT inhaler Inhale 2 puffs into the lungs every 6 (six) hours  as needed for wheezing.  [name] albuterol (PROVENTIL) (2.5 MG/3ML) 0.083% nebulizer solution Use 1 vial in nebulizer up to 3 times a day when albuterol inhaler not working. If no better or getting worse, call 911 or physician line  . ASTEPRO 0.15 % SOLN Use 2 sprays into both nostrils daily as needed  . cetirizine (ZYRTEC) 10 MG tablet Take 1 tablet (10 mg total) by mouth daily.  [name] EPINEPHrine (EPIPEN 2-PAK) 0.3 mg/0.3 mL IJ SOAJ injection Inject into the muscle as needed (for allergic reaction).   . mometasone-formoterol (DULERA) 200-5 MCG/ACT AERO INHALE 2 PUFFS BY MOUTH TWICE DAILY TO  PREVENT  COUGH  OR  WHEEZE.  RINSE  GARGLE  AND  SPIT  AFTER  . montelukast (SINGULAIR) 10 MG tablet Take 1 tablet (10 mg total) by mouth at bedtime.  . pantoprazole (PROTONIX) 40 MG tablet TAKE 1 TABLET BY MOUTH ONCE DAILY     Allergies:   Bee venom; Aspirin; and Other   Social History   Socioeconomic History  . Marital status: Single    Spouse name: Not on file  . Number of children: 3  . Years of education: 12th grade  . Highest education level: Not on file  Occupational History  . Occupation: Disability    Comment: previously worked in [employer] had to stop 2/2 occupational exposures leading to exacerbations of asthma   Social Needs  . Financial resource strain: Not on file  . Food insecurity:    Worry: Not on file    Inability: Not on file  . Transportation needs:    Medical: Not on file    Non-medical: Not on file  Tobacco Use  . Smoking status: Former Smoker    Packs/day: 1.00    Years: 30.00    Pack years: 30.00    Types: Cigarettes    Last attempt to quit: 12/04/2010    Years since quitting: 7.4  . Smokeless tobacco: Never Used  Substance and Sexual Activity  . Alcohol use: Yes    Alcohol/week: 0.0 standard drinks    Comment: now drinking a beer on weekends //   . Drug use: No  . Sexual activity: Not on file  Lifestyle  . Physical activity:    Days per week: Not on file    Minutes  per session: Not on file  . Stress: Not on file  Relationships  . Social connections:    Talks on phone: Not on file    Gets together: Not on file    Attends religious service: Not on file    Active member of club or organization: Not on file    Attends meetings of clubs or organizations: Not on file    Relationship status: Not on file  Other Topics Concern  . 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      Pt lives with her son      Pt has received financial assistance approval for 100% discount at [facility] and has [employer] card.      Family History: The patient's family history includes Breast cancer in her sister; Breast cancer (age of onset: 64) in her sister; Coronary artery disease (age of onset: 38) in her mother; Diabetes in her mother and sister.  ROS:   Please see the history of present illness.    ROS  All other systems reviewed and negative.   EKGs/Labs/Other Studies Reviewed:    The following studies were reviewed today: PAP download  EKG:  EKG is not ordered today.    Recent Labs: 08/24/2017: BUN 9; Creatinine, Ser 1.12; Hemoglobin 13.8; Magnesium 2.1; Platelets 212; Potassium 3.5; Sodium 137; TSH 2.124   Recent Lipid Panel    Component Value Date/Time   CHOL 185 09/09/2012 0540   TRIG 55 09/09/2012 0540   HDL 81 09/09/2012 0540   CHOLHDL 2.3 09/09/2012 0540   VLDL 11 09/09/2012 0540   LDLCALC 93 09/09/2012 0540    Physical Exam:    VS:  BP 110/68   Pulse 73   Ht 5' (1.524 m)   Wt 241 lb 6.4 oz (109.5 kg)   SpO2 96%   BMI 47.15 kg/m     Wt Readings from Last 3 Encounters:  05/26/18 241 lb 6.4 oz (109.5 kg)  05/10/18 244 lb 3.2 oz (110.8 kg)  02/27/18 242 lb (109.8 kg)     GEN:  Well nourished, well developed in no acute distress HEENT: Normal NECK: No JVD; No carotid bruits LYMPHATICS: No lymphadenopathy CARDIAC: RRR, no murmurs, rubs, gallops RESPIRATORY:  Clear to  auscultation without rales, wheezing or rhonchi  ABDOMEN: Soft, non-tender, non-distended MUSCULOSKELETAL:  No  edema; No deformity  SKIN: Warm and dry NEUROLOGIC:  Alert and oriented x 3 PSYCHIATRIC:  Normal affect   ASSESSMENT:    1. Obstructive sleep apnea syndrome   2. Morbid obesity with BMI of 45.0-49.9, adult (HCC)   3. Paroxysmal atrial fibrillation ([place])   4. SOB (shortness of breath)    PLAN:    In order of problems listed above:  1.  OSA - the patient is tolerating PAP therapy well without any problems. The PAP download was reviewed today and showed an AHI of 1.1/hr on 15 cm H2O with 0% compliance in using more than 4 hours nightly.  The patient has been using and benefiting from PAP use and will continue to benefit from therapy.  Her compliance has been very poor because she does not like the mask he is using.  She would like to try nasal mask so I will order a DreamWear Respironics nasal under the nose mask and get a download in 6 weeks.  I am going to see her back in 3 months to make sure she is doing okay.  2.  Morbid obesity - I have encouraged her to get into a routine exercise program and cut back on carbs and portions.   3.  PAF -she is maintaining normal sinus rhythm on exam today.  She is not anticoagulated due to a CHADS2VASC score of 1 for female.  4.  Shortness of breath -she says that when she goes outside in the cold air she will get very short of breath and become very fatigued.  She is not had any chest pain but is very concerned about this.  She does have cardiac risk factors including obesity, family history of CAD and remote history of tobacco use.  I am going to get a stress Myoview to rule out ischemia.   Medication Adjustments/Labs and Tests Ordered: Current medicines are reviewed at length with the patient today.  Concerns regarding medicines are outlined above.   No orders of the defined types were placed in this encounter.  No orders of the defined  types were placed in this encounter.   Signed, Fransico Him, MD  05/26/2018 9:19 AM    [place]

## 2018-05-29 ENCOUNTER — Other Ambulatory Visit: Payer: Self-pay | Admitting: Allergy and Immunology

## 2018-05-29 DIAGNOSIS — J449 Chronic obstructive pulmonary disease, unspecified: Secondary | ICD-10-CM

## 2018-06-12 ENCOUNTER — Telehealth (HOSPITAL_COMMUNITY): Payer: Self-pay | Admitting: *Deleted

## 2018-06-12 NOTE — Telephone Encounter (Signed)
Patient given detailed instructions per Myocardial Perfusion Study Information Sheet for the test on 06/14/18. Patient notified to arrive 15 minutes early and that it is imperative to arrive on time for appointment to keep from having the test rescheduled.  If you need to cancel or reschedule your appointment, please call the office within 24 hours of your appointment. . Patient verbalized understanding. Maria Nelson

## 2018-06-14 ENCOUNTER — Ambulatory Visit (HOSPITAL_COMMUNITY): Payer: Medicaid Other | Attending: Internal Medicine

## 2018-06-14 DIAGNOSIS — R0602 Shortness of breath: Secondary | ICD-10-CM | POA: Diagnosis not present

## 2018-06-14 MED ORDER — TECHNETIUM TC 99M TETROFOSMIN IV KIT
32.4000 | PACK | Freq: Once | INTRAVENOUS | Status: DC | PRN
  Filled 2018-06-14: qty 33

## 2018-06-14 MED ORDER — TECHNETIUM TC 99M TETROFOSMIN IV KIT
31.4000 | PACK | Freq: Once | INTRAVENOUS | Status: AC | PRN
  Administered 2018-06-14: 31.4 via INTRAVENOUS
  Filled 2018-06-14: qty 32

## 2018-06-14 MED ORDER — REGADENOSON 0.4 MG/5ML IV SOLN
0.4000 mg | Freq: Once | INTRAVENOUS | Status: AC
  Administered 2018-06-14: 0.4 mg via INTRAVENOUS

## 2018-06-14 MED ORDER — REGADENOSON 0.4 MG/5ML IV SOLN: 0.4000 mg | Freq: Once | INTRAVENOUS | Status: DC

## 2018-06-15 ENCOUNTER — Ambulatory Visit (HOSPITAL_COMMUNITY): Payer: Medicaid Other | Attending: Internal Medicine

## 2018-06-15 LAB — MYOCARDIAL PERFUSION IMAGING
CHL CUP MPHR: 158 {beats}/min
CHL CUP RESTING HR STRESS: 58 {beats}/min
CSEPPHR: 125 {beats}/min
Estimated workload: 4.4 METS
Exercise duration (min): 1 min
Exercise duration (sec): 37 s
LV dias vol: 57 mL (ref 46–106)
LV sys vol: 19 mL
Percent HR: 79 %
RPE: 19
SDS: 4
SRS: 1
SSS: 5
TID: 0.89

## 2018-06-15 MED ORDER — TECHNETIUM TC 99M TETROFOSMIN IV KIT
33.0000 | PACK | Freq: Once | INTRAVENOUS | Status: AC | PRN
  Administered 2018-06-15: 33 via INTRAVENOUS
  Filled 2018-06-15: qty 33

## 2018-06-21 ENCOUNTER — Telehealth: Payer: Self-pay

## 2018-06-21 DIAGNOSIS — G4733 Obstructive sleep apnea (adult) (pediatric): Secondary | ICD-10-CM | POA: Diagnosis not present

## 2018-06-21 NOTE — Telephone Encounter (Signed)
-----   Message from Sueanne Margarita, MD sent at 06/17/2018  4:05 PM EST ----- Stress test showed no ischemia. ? Infarct on nuclear images although wall motion normal.  I would like her to have a coronary CTA with morph and FFR

## 2018-06-21 NOTE — Telephone Encounter (Signed)
Spoke with the patient, she stated she wanted to think about if she would proceed with a Cardiac CT. I advised that patient to let us know if she would like [place] to order the test. She requested the instructions to be sent to her home so she can use it to help her decision.

## 2018-07-04 ENCOUNTER — Other Ambulatory Visit: Payer: Self-pay | Admitting: Internal Medicine

## 2018-07-12 ENCOUNTER — Encounter: Payer: Medicaid Other | Admitting: Internal Medicine

## 2018-08-09 ENCOUNTER — Telehealth: Payer: Self-pay | Admitting: *Deleted

## 2018-08-09 NOTE — Telephone Encounter (Signed)
-----   Message from Sueanne Margarita, MD sent at 08/08/2018  8:19 PM EDT ----- Good AHI on PAP but needs to improve compliance.  Please find out why she is not using her PAP

## 2018-08-09 NOTE — Telephone Encounter (Signed)
Informed patient of compliance results and verbalized understanding was indicated. Patient is aware and agreeable to AHI being within range at 1.2. Patient is aware and agreeable to improving in compliance with machine usage Patient states she has trouble sleeping at night due to covid-19 scares but she will start wearing her mask during the day when she naps.

## 2018-08-16 ENCOUNTER — Encounter: Payer: Medicaid Other | Admitting: Internal Medicine

## 2018-08-19 ENCOUNTER — Other Ambulatory Visit: Payer: Self-pay | Admitting: Internal Medicine

## 2018-09-05 ENCOUNTER — Telehealth: Payer: Self-pay | Admitting: Cardiology

## 2018-09-05 NOTE — Telephone Encounter (Signed)
Virtual Visit Pre-Appointment Phone Call  "(Name), I am calling you today to discuss your upcoming appointment. We are currently trying to limit exposure to the virus that causes COVID-19 by seeing patients at home rather than in the office."  1. "What is the BEST phone number to call the day of the visit?" - include this in appointment notes  2. Do you have or have access to (through a family member/friend) a smartphone with video capability that we can use for your visit?" a. If yes - list this number in appt notes as cell (if different from BEST phone #) and list the appointment type as a VIDEO visit in appointment notes b. If no - list the appointment type as a PHONE visit in appointment notes  3. Confirm consent - "In the setting of the current Covid19 crisis, you are scheduled for a (phone or video) visit with your provider on (date) at (time).  Just as we do with many in-office visits, in order for you to participate in this visit, we must obtain consent.  If you'd like, I can send this to your mychart (if signed up) or email for you to review.  Otherwise, I can obtain your verbal consent now.  All virtual visits are billed to your insurance company just like a normal visit would be.  By agreeing to a virtual visit, we'd like you to understand that the technology does not allow for your provider to perform an examination, and thus may limit your provider's ability to fully assess your condition. If your provider identifies any concerns that need to be evaluated in person, we will make arrangements to do so.  Finally, though the technology is pretty good, we cannot assure that it will always work on either your or our end, and in the setting of a video visit, we may have to convert it to a phone-only visit.  In either situation, we cannot ensure that we have a secure connection.  Are you willing to proceed?" STAFF: Did the patient verbally acknowledge consent to telehealth visit? Document  YES/NO here: yes/Telephone only [id number]/verbal consent 09/06/18/vitals/  4. Advise patient to be prepared - "Two hours prior to your appointment, go ahead and check your blood pressure, pulse, oxygen saturation, and your weight (if you have the equipment to check those) and write them all down. When your visit starts, your provider will ask you for this information. If you have an Apple Watch or Kardia device, please plan to have heart rate information ready on the day of your appointment. Please have a pen and paper handy nearby the day of the visit as well."  5. Give patient instructions for MyChart download to smartphone OR Doximity/Doxy.me as below if video visit (depending on what platform provider is using)  6. Inform patient they will receive a phone call 15 minutes prior to their appointment time (may be from unknown caller ID) so they should be prepared to answer    TELEPHONE CALL NOTE  Maria Nelson has been deemed a candidate for a follow-up tele-health visit to limit community exposure during the Covid-19 pandemic. I spoke with the patient via phone to ensure availability of phone/video source, confirm preferred email & phone number, and discuss instructions and expectations.  I reminded Maria Nelson to be prepared with any vital sign and/or heart rhythm information that could potentially be obtained via home monitoring, at the time of her visit. I reminded Maria Nelson to expect a phone call prior  to her visit.  Armando Gang 09/05/2018 10:58 AM   INSTRUCTIONS FOR DOWNLOADING THE MYCHART APP TO SMARTPHONE  - The patient must first make sure to have activated MyChart and know their login information - If Apple, go to [employer] and type in MyChart in the search bar and download the app. If Android, ask patient to go to [employer] and type in [place] in the search bar and download the app. The app is free but as with any other app downloads, their  phone may require them to verify saved payment information or Apple/Android password.  - The patient will need to then log into the app with their MyChart username and password, and select [facility] as their healthcare provider to link the account. When it is time for your visit, go to the MyChart app, find appointments, and click Begin Video Visit. Be sure to Select Allow for your device to access the Microphone and Camera for your visit. You will then be connected, and your provider will be with you shortly.  **If they have any issues connecting, or need assistance please contact MyChart service desk [phone])**  **If using a computer, in order to ensure the best quality for their visit they will need to use either of the following Internet Browsers: [employer], or Google Chrome**  IF USING DOXIMITY or DOXY.ME - The patient will receive a link just prior to their visit by text.     FULL LENGTH CONSENT FOR TELE-HEALTH VISIT   I hereby voluntarily request, consent and authorize [facility] and its employed or contracted physicians, physician assistants, nurse practitioners or other licensed health care professionals (the Practitioner), to provide me with telemedicine health care services (the Services") as deemed necessary by the treating Practitioner. I acknowledge and consent to receive the Services by the Practitioner via telemedicine. I understand that the telemedicine visit will involve communicating with the Practitioner through live audiovisual communication technology and the disclosure of certain medical information by electronic transmission. I acknowledge that I have been given the opportunity to request an in-person assessment or other available alternative prior to the telemedicine visit and am voluntarily participating in the telemedicine visit.  I understand that I have the right to withhold or withdraw my consent to the use of telemedicine in the course of  my care at any time, without affecting my right to future care or treatment, and that the Practitioner or I may terminate the telemedicine visit at any time. I understand that I have the right to inspect all information obtained and/or recorded in the course of the telemedicine visit and may receive copies of available information for a reasonable fee.  I understand that some of the potential risks of receiving the Services via telemedicine include:   Delay or interruption in medical evaluation due to technological equipment failure or disruption;  Information transmitted may not be sufficient (e.g. poor resolution of images) to allow for appropriate medical decision making by the Practitioner; and/or   In rare instances, security protocols could fail, causing a breach of personal health information.  Furthermore, I acknowledge that it is my responsibility to provide information about my medical history, conditions and care that is complete and accurate to the best of my ability. I acknowledge that Practitioner's advice, recommendations, and/or decision may be based on factors not within their control, such as incomplete or inaccurate data provided by me or distortions of diagnostic images or specimens that may result from electronic transmissions.  I understand that the practice of medicine is not an exact science and that Practitioner makes no warranties or guarantees regarding treatment outcomes. I acknowledge that I will receive a copy of this consent concurrently upon execution via email to the email address I last provided but may also request a printed copy by calling the office of [facility].    I understand that my insurance will be billed for this visit.   I have read or had this consent read to me.  I understand the contents of this consent, which adequately explains the benefits and risks of the Services being provided via telemedicine.   I have been provided ample opportunity to ask  questions regarding this consent and the Services and have had my questions answered to my satisfaction.  I give my informed consent for the services to be provided through the use of telemedicine in my medical care  By participating in this telemedicine visit I agree to the above.

## 2018-09-06 ENCOUNTER — Ambulatory Visit: Payer: Medicaid Other | Admitting: Internal Medicine

## 2018-09-06 ENCOUNTER — Encounter: Payer: Self-pay | Admitting: Internal Medicine

## 2018-09-06 ENCOUNTER — Other Ambulatory Visit: Payer: Self-pay

## 2018-09-06 ENCOUNTER — Other Ambulatory Visit (HOSPITAL_COMMUNITY)
Admission: RE | Admit: 2018-09-06 | Discharge: 2018-09-06 | Disposition: A | Discharge status: Home / Self Care | Payer: Medicaid Other | Source: Ambulatory Visit | Attending: Internal Medicine | Admitting: Internal Medicine

## 2018-09-06 ENCOUNTER — Ambulatory Visit: Payer: Medicaid Other | Admitting: Dietician

## 2018-09-06 VITALS — BP 113/69 | HR 66 | Temp 98.3°F | Ht 62.0 in | Wt 256.2 lb

## 2018-09-06 DIAGNOSIS — Z6841 Body Mass Index (BMI) 40.0 and over, adult: Secondary | ICD-10-CM | POA: Diagnosis not present

## 2018-09-06 DIAGNOSIS — R0602 Shortness of breath: Secondary | ICD-10-CM | POA: Diagnosis not present

## 2018-09-06 DIAGNOSIS — Z8742 Personal history of other diseases of the female genital tract: Secondary | ICD-10-CM | POA: Diagnosis not present

## 2018-09-06 DIAGNOSIS — Z124 Encounter for screening for malignant neoplasm of cervix: Secondary | ICD-10-CM | POA: Diagnosis not present

## 2018-09-06 DIAGNOSIS — R102 Pelvic and perineal pain: Secondary | ICD-10-CM | POA: Insufficient documentation

## 2018-09-06 DIAGNOSIS — Z0000 Encounter for general adult medical examination without abnormal findings: Secondary | ICD-10-CM

## 2018-09-06 HISTORY — DX: Pelvic and perineal pain: R10.2

## 2018-09-06 NOTE — Assessment & Plan Note (Signed)
Patient presented for a Pap smear today.  She denies any abnormal Pap smears in the past and no family history of cervical cancer.  States her periods stopped around the age of 59.  Denies any abnormal bleeding or discharge.  She states sometimes she has bilateral adnexal pain that is worse on the left.  Has a history of ovarian cysts one that had ruptured in the past.  On pelvic exam she had significant tenderness on the left adnexal side and some on the right.  Will order a pelvic ultrasound and follow-up with Pap smear results.  Pan: - Pelvic ultrasound

## 2018-09-06 NOTE — Assessment & Plan Note (Signed)
Patient reports that she sometimes has trouble breathing due to her significant breast size and weight.  Discussed diet and weight loss.  She also brought up the possibility of breast reduction but discussed with her age and comorbidities this may be more difficult.  Patient is amenable to meeting with a nutritionist for further counseling at this point in possible weight reduction program.  Plan: -Referral to nutrition for counseling on weight reduction

## 2018-09-06 NOTE — Assessment & Plan Note (Signed)
Patient presented today for her Pap smear.  We will follow-up with the results.  If they are normal this may be patient's last Pap smear.

## 2018-09-06 NOTE — Patient Instructions (Addendum)
Maria Nelson,   It was a pleasure seeing you today. I will call you with the results of your pap smear once I get them. Someone from the radiology department should be in touch with you to schedule a pelvic ultrasound in about one week. I have also sent in a referral to our nutrition educator who can help with this.  We can plan to follow up in 6 months or sooner if anything comes up! Thanks, Dr. Laural Golden

## 2018-09-06 NOTE — Progress Notes (Signed)
    CC: weight loss, pap smear   HPI:  [name] is a 63 y.o. with a past medical history listed below presenting today for a Pap smear and to discuss weight loss. For details of today's visit and the status of his chronic medical issues please refer to the assessment and plan.   Past Medical History:  Diagnosis Date  . 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 Asthma   . Carpal tunnel syndrome 06/23/2011   Patient notes history of bilateral carpal tunnel syndrome.  Now has symptoms of right hand carpal tunnel.   [name] COPD (chronic obstructive pulmonary disease) ([place])    secondary to tobacco use // No PFTs on file  . ETOH abuse    Pt stopped in 3/08  . Gastrointestinal hemorrhage    secondary to PUD on March 2008  . OSA on CPAP    noncompliant with CPAP (2/2 it being depressing)  . Peptic ulcer disease    +H. pylori (antigen)- Dr. Olevia Perches- treated  . STRESS INCONTINENCE 01/24/2007  . Tobacco abuse    quit in 2010  . Urinary incontinence   . Urticaria    Review of Systems:   Review of Systems  Constitutional: Negative for chills, fever, malaise/fatigue and weight loss.  HENT: Negative for congestion and sore throat.   Respiratory: Positive for shortness of breath. Negative for cough and sputum production.   Cardiovascular: Negative for chest pain, palpitations, orthopnea and leg swelling.  Gastrointestinal: Negative for abdominal pain, nausea and vomiting.  Genitourinary: Negative for dysuria, hematuria and urgency.  Musculoskeletal: Negative for back pain and joint pain.  Neurological: Negative for sensory change, weakness and headaches.    Physical Exam:  Vitals:   09/06/18 1347  BP: 113/69  Pulse: 66  Temp: 98.3 F (36.8 C)  TempSrc: Oral  SpO2: 97%  Weight: 256 lb 3.2 oz (116.2 kg)  Height: 5\' 2"  (1.575 m)   Physical Exam Constitutional:      Appearance: Normal appearance. She is obese.  Cardiovascular:     Rate and Rhythm: Normal rate and regular rhythm.   Pulses: Normal pulses.     Heart sounds: Normal heart sounds. No murmur. No friction rub. No gallop.   Pulmonary:     Effort: Pulmonary effort is normal. No respiratory distress.     Breath sounds: Normal breath sounds. No wheezing or rales.  Abdominal:     General: Abdomen is flat. Bowel sounds are normal. There is no distension.     Palpations: Abdomen is soft.     Tenderness: There is no abdominal tenderness.  Genitourinary:    General: Normal vulva.     Vagina: No vaginal discharge.  Musculoskeletal:        General: No swelling or tenderness.     Right lower leg: No edema.     Left lower leg: No edema.  Skin:    General: Skin is warm and dry.  Neurological:     Mental Status: She is alert and oriented to person, place, and time.  Psychiatric:        Mood and Affect: Mood normal.        Behavior: Behavior normal.        Thought Content: Thought content normal.        Judgment: Judgment normal.     Assessment & Plan:   See Encounters Tab for problem based charting.  Patient discussed with Dr. Dareen Piano

## 2018-09-07 NOTE — Progress Notes (Signed)
Internal Medicine Clinic Attending  Case discussed with Dr. Rehman at the time of the visit.  We reviewed the resident's history and exam and pertinent patient test results.  I agree with the assessment, diagnosis, and plan of care documented in the resident's note.  

## 2018-09-07 NOTE — Progress Notes (Signed)
Virtual Visit via Telephone Note   This visit type was conducted due to national recommendations for restrictions regarding the COVID-19 Pandemic (e.g. social distancing) in an effort to limit this patient's exposure and mitigate transmission in our community.  Due to her co-morbid illnesses, this patient is at least at moderate risk for complications without adequate follow up.  This format is felt to be most appropriate for this patient at this time.  All issues noted in this document were discussed and addressed.  A limited physical exam was performed with this format.  Please refer to the patient's chart for her consent to telehealth for [facility].   Evaluation Performed:  Follow-up visit  This visit type was conducted due to national recommendations for restrictions regarding the COVID-19 Pandemic (e.g. social distancing).  This format is felt to be most appropriate for this patient at this time.  All issues noted in this document were discussed and addressed.  No physical exam was performed (except for noted visual exam findings with Video Visits).  Please refer to the patient's chart (MyChart message for video visits and phone note for telephone visits) for the patient's consent to telehealth for [facility].  Date:  09/08/2018   ID:  Maria Nelson, DOB [birth year], MRN [id number]  Patient Location:  Home  Provider location:   [place]  PCP:  Mike Craze, DO  Cardiologist: Fransico Him, MD Electrophysiologist:  None   Chief Complaint:  OSA and atrial fibrillation  History of Present Illness:    Maria Nelson is a 63 y.o. female who presents via audio/video conferencing for a telehealth visit today.    Maria Nelson is a 63 y.o. female with a hx of severe obstructive sleep apnea with an AHI of 39/h and no significant central sleep apnea. She had moderate oxygen desaturations on sleep study as low as 76% with moderate snoring. She was noted to  have nocturnal hypoxemia as well. She was titrated to CPAP therapy at 15 cm H2O.  She also has a history of PAF and has not been on anticoagulation due to [facility] score of 1.  When I saw her a year ago she was complaining of SOB. A nuclear stress test showed no ischemia but possible prior infarct.  She was supposed to have a coronary CTA but this does not appear to have been ordered.   She is here today for followup and is doing well.  She went to the hospital clinic to get her PAP smear and got lost in the hospital and got upset and got some chest pressure and had to sit down.  Once she sat down this resolved.  She has had other episodes of chest pressure when she gets upset but also when she is active. She has chronic DOE due to morbid obesity and COPD but it has gotten worse.  She denies any PND, orthopnea, dizziness, palpitations or syncope.  She has chronic LE edema which she thinks is related to her morbid obesity. She is compliant with her meds and is tolerating meds with no SE.  Her download in April showed she was not compliant because she did not tolerate her mask.  She says that she has been working with the mask but since the COVID 19 started she has not been sleeping well at night and has been sleeping more during the day so she has started using it when she sleeps during the day.  She says that her mask is working better. She feels  the pressure is adequate.  She sleeps more during the day and uses it then and feels rested when she gets up.  She denies any significant mouth or nasal dryness or nasal congestion.  She does not think that he snores.    The patient does not have symptoms concerning for COVID-19 infection (fever, chills, cough, or new shortness of breath).   Prior CV studies:   The following studies were reviewed today:  None  Past Medical History:  Diagnosis Date  . 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 Asthma   . Carpal tunnel syndrome 06/23/2011   Patient notes history of bilateral  carpal tunnel syndrome.  Now has symptoms of right hand carpal tunnel.   [name] COPD (chronic obstructive pulmonary disease) ([place])    secondary to tobacco use // No PFTs on file  . ETOH abuse    Pt stopped in 3/08  . Gastrointestinal hemorrhage    secondary to PUD on March 2008  . OSA on CPAP    noncompliant with CPAP (2/2 it being depressing)  . Peptic ulcer disease    +H. pylori (antigen)- Dr. Olevia Perches- treated  . STRESS INCONTINENCE 01/24/2007  . Tobacco abuse    quit in 2010  . Urinary incontinence   . Urticaria    Past Surgical History:  Procedure Laterality Date  . BREAST EXCISIONAL BIOPSY Right 1994  . MANDIBLE SURGERY     due to trauma  . TUBAL LIGATION       Current Meds  Medication Sig  . albuterol (PROVENTIL HFA;VENTOLIN HFA) 108 (90 BASE) MCG/ACT inhaler Inhale 2 puffs into the lungs every 6 (six) hours as needed for wheezing.  [name] albuterol (PROVENTIL) (2.5 MG/3ML) 0.083% nebulizer solution Use 1 vial in nebulizer up to 3 times a day when albuterol inhaler not working. If no better or getting worse, call 911 or physician line  . ASTEPRO 0.15 % SOLN Use 2 sprays into both nostrils daily as needed  . cetirizine (ZYRTEC) 10 MG tablet Take 1 tablet by mouth once daily  . EPINEPHrine (EPIPEN 2-PAK) 0.3 mg/0.3 mL IJ SOAJ injection Inject into the muscle as needed (for allergic reaction).   . mometasone-formoterol (DULERA) 200-5 MCG/ACT AERO INHALE 2 PUFFS BY MOUTH TWICE DAILY TO PREVNT COUGH OR WHEEZE. RISE GARGLE AND SPIT AFTER.  . pantoprazole (PROTONIX) 40 MG tablet Take 1 tablet by mouth once daily     Allergies:   Bee venom; Aspirin; and Other   Social History   Tobacco Use  . Smoking status: Former Smoker    Packs/day: 1.00    Years: 30.00    Pack years: 30.00    Types: Cigarettes    Last attempt to quit: 12/04/2010    Years since quitting: 7.7  . Smokeless tobacco: Never Used  Substance Use Topics  . Alcohol use: Yes    Alcohol/week: 0.0 standard drinks     Comment: now drinking a beer on weekends //   . Drug use: No     Family Hx: The patient's family history includes Breast cancer in her sister; Breast cancer (age of onset: 84) in her sister; Coronary artery disease (age of onset: 37) in her mother; Diabetes in her mother and sister.  ROS:   Please see the history of present illness.     All other systems reviewed and are negative.   Labs/Other Tests and Data Reviewed:    Recent Labs: No results found for requested labs within last 8760 hours.   Recent Lipid Panel  Lab Results  Component Value Date/Time   CHOL 185 09/09/2012 05:40 AM   TRIG 55 09/09/2012 05:40 AM   HDL 81 09/09/2012 05:40 AM   CHOLHDL 2.3 09/09/2012 05:40 AM   LDLCALC 93 09/09/2012 05:40 AM    Wt Readings from Last 3 Encounters:  09/08/18 256 lb (116.1 kg)  09/06/18 256 lb 3.2 oz (116.2 kg)  06/14/18 241 lb (109.3 kg)     Objective:    Vital Signs:  Ht 5\' 2"  (1.575 m)   Wt 256 lb (116.1 kg)   LMP 04/19/2004 (Approximate)   BMI 46.82 kg/m     ASSESSMENT & PLAN:    1.  OSA - the patient is tolerating PAP therapy and doing better with her mask.  She has not been sleeping much at night but has been sleeping during the day and using her device and feeling much better. The patient has been using and benefiting from PAP use and will continue to benefit from therapy.  I will get a download from her DME.ibuprofen encouraged her to be more compliant with her device.    2.  Morbid obesity - She has gained 16lbs since the COVID 19 started.  I have encouraged her to get into a routine exercise program and cut back on carbs and portions.   3.  PAF - she has been maintaining NSR with no breakthrough of palpitations.  She is not anticoagulated due to a CHADS2VASC score of 1.  4.  SOB - her stress test earlier in the year showed possible inferior infarct. She continues to have DOE as well as chest pain intermittently.  It is difficult to determine if this is related  to CAD or morbid obesity and deconditioning.  Given her abnormal nuclear stress test I have recommended that we follow through with the coronary CTA which was ordered a while back and patient did not follow through.    5.  COVID-19 Education:The signs and symptoms of COVID-19 were discussed with the patient and how to seek care for testing (follow up with PCP or arrange E-visit).  The importance of social distancing was discussed today.  Patient Risk:   After full review of this patient's clinical status, I feel that they are at least moderate risk at this time.  Time:   Today, I have spent 15 minutes directly with the patient on video discussing medical problems including OSA, HTN, SOB.  We also reviewed the symptoms of COVID 19 and the ways to protect against contracting the virus with telehealth technology.  I spent an additional 5 minutes reviewing patient's chart including PAP compliance download, stress test.  Medication Adjustments/Labs and Tests Ordered: Current medicines are reviewed at length with the patient today.  Concerns regarding medicines are outlined above.  Tests Ordered: No orders of the defined types were placed in this encounter.  Medication Changes: No orders of the defined types were placed in this encounter.   Disposition:  Follow up in 1 year(s)  Signed, Fransico Him, MD  09/08/2018 11:21 AM    [facility]

## 2018-09-08 ENCOUNTER — Telehealth (INDEPENDENT_AMBULATORY_CARE_PROVIDER_SITE_OTHER): Payer: Medicaid Other | Admitting: Cardiology

## 2018-09-08 ENCOUNTER — Other Ambulatory Visit: Payer: Self-pay

## 2018-09-08 ENCOUNTER — Encounter: Payer: Self-pay | Admitting: Cardiology

## 2018-09-08 VITALS — Ht 62.0 in | Wt 256.0 lb

## 2018-09-08 DIAGNOSIS — Z6841 Body Mass Index (BMI) 40.0 and over, adult: Secondary | ICD-10-CM

## 2018-09-08 DIAGNOSIS — I480 Paroxysmal atrial fibrillation: Secondary | ICD-10-CM

## 2018-09-08 DIAGNOSIS — G4733 Obstructive sleep apnea (adult) (pediatric): Secondary | ICD-10-CM

## 2018-09-08 DIAGNOSIS — Z7189 Other specified counseling: Secondary | ICD-10-CM | POA: Diagnosis not present

## 2018-09-08 DIAGNOSIS — R0602 Shortness of breath: Secondary | ICD-10-CM

## 2018-09-08 LAB — CYTOLOGY - PAP
Adequacy: ABSENT
Diagnosis: NEGATIVE
HPV: NOT DETECTED

## 2018-09-08 MED ORDER — METOPROLOL TARTRATE 100 MG PO TABS: ORAL_TABLET | ORAL | 0 refills | Status: DC

## 2018-09-08 NOTE — Patient Instructions (Addendum)
Medication Instructions:  Your physician recommends that you continue on your current medications as directed. Please refer to the Current Medication list given to you today.  If you need a refill on your cardiac medications before your next appointment, please call your pharmacy.   Lab work: BMET before Cardiac CT  If you have labs (blood work) drawn today and your tests are completely normal, you will receive your results only by: [name] MyChart Message (if you have MyChart) OR . A paper copy in the mail If you have any lab test that is abnormal or we need to change your treatment, we will call you to review the results.  Testing/Procedures: Your physician has requested that you have cardiac CT. Cardiac computed tomography (CT) is a painless test that uses an x-ray machine to take clear, detailed pictures of your heart. For further information please visit [facility]tn. Please follow instruction sheet as given.  Follow-Up: At [facility], you and your health needs are our priority.  As part of our continuing mission to provide you with exceptional heart care, we have created designated Provider Care Teams.  These Care Teams include your primary Cardiologist (physician) and Advanced Practice Providers (APPs -  Physician Assistants and Nurse Practitioners) who all work together to provide you with the care you need, when you need it. You will need a follow up appointment in 1 years.  Please call our office 2 months in advance to schedule this appointment.  You may see Dr. Radford Pax or one of the following Advanced Practice Providers on your designated Care Team:   Lyons, PA-C Melina Copa, PA-C . Ermalinda Barrios, PA-C  Please arrive at the [facility] main entrance of [facility] at xx:xx AM (30-45 minutes prior to test start time)  [facility] [place], [place] [address] [phone]  Proceed to the [facility] Radiology Department (First  Floor).  Please follow these instructions carefully (unless otherwise directed):  On the Night Before the Test: . Be sure to Drink plenty of water. . Do not consume any caffeinated/decaffeinated beverages or chocolate 12 hours prior to your test. . Do not take any antihistamines 12 hours prior to your test. ( Zyrtec)  On the Day of the Test: . Drink plenty of water. Do not drink any water within one hour of the test. . Do not eat any food 4 hours prior to the test. . You may take your regular medications prior to the test.  . Take metoprolol (Lopressor) 100 mg, two hours prior to test. After the Test: . Drink plenty of water. . After receiving IV contrast, you may experience a mild flushed feeling. This is normal. . On occasion, you may experience a mild rash up to 24 hours after the test. This is not dangerous. If this occurs, you can take Benadryl 25 mg and increase your fluid intake. . If you experience trouble breathing, this can be serious. If it is severe call 911 IMMEDIATELY. If it is mild, please call our office. . If you take any of these medications: Glipizide/Metformin, Avandament, Glucavance, please do not take 48 hours after completing test.

## 2018-09-18 ENCOUNTER — Other Ambulatory Visit: Payer: Medicaid Other | Admitting: *Deleted

## 2018-09-18 ENCOUNTER — Other Ambulatory Visit: Payer: Self-pay

## 2018-09-18 DIAGNOSIS — R0602 Shortness of breath: Secondary | ICD-10-CM | POA: Diagnosis not present

## 2018-09-19 LAB — BASIC METABOLIC PANEL
BUN/Creatinine Ratio: 12 (ref 12–28)
BUN: 14 mg/dL (ref 8–27)
CO2: 24 mmol/L (ref 20–29)
Calcium: 9.4 mg/dL (ref 8.7–10.3)
Chloride: 104 mmol/L (ref 96–106)
Creatinine, Ser: 1.2 mg/dL — ABNORMAL HIGH (ref 0.57–1.00)
GFR calc Af Amer: 56 mL/min/{1.73_m2} — ABNORMAL LOW (ref >59)
GFR calc non Af Amer: 48 mL/min/{1.73_m2} — ABNORMAL LOW (ref >59)
Glucose: 88 mg/dL (ref 65–99)
Potassium: 4.1 mmol/L (ref 3.5–5.2)
Sodium: 142 mmol/L (ref 134–144)

## 2018-09-20 ENCOUNTER — Telehealth (HOSPITAL_COMMUNITY): Payer: Self-pay | Admitting: Emergency Medicine

## 2018-09-20 NOTE — Telephone Encounter (Signed)
Reaching out to patient to offer assistance regarding upcoming cardiac imaging study; pt verbalizes understanding of appt date/time, parking situation and where to check in, pre-test NPO status and medications ordered, and verified current allergies; name and call back number provided for further questions should they arise Kayonna Lawniczak RN Navigator Cardiac Imaging  Heart and Vascular [phone] office [phone] cell  Pt denies covid symptoms, verbalized understanding of visitor policy. 

## 2018-09-21 ENCOUNTER — Other Ambulatory Visit: Payer: Self-pay

## 2018-09-21 ENCOUNTER — Encounter (HOSPITAL_COMMUNITY): Payer: Self-pay

## 2018-09-21 ENCOUNTER — Ambulatory Visit (HOSPITAL_COMMUNITY)
Admission: RE | Admit: 2018-09-21 | Discharge: 2018-09-21 | Disposition: A | Discharge status: Home / Self Care | Payer: Medicaid Other | Source: Ambulatory Visit | Attending: Cardiology | Admitting: Cardiology

## 2018-09-21 DIAGNOSIS — R0602 Shortness of breath: Secondary | ICD-10-CM

## 2018-09-21 MED ORDER — NITROGLYCERIN 0.4 MG SL SUBL
0.8000 mg | SUBLINGUAL_TABLET | Freq: Once | SUBLINGUAL | Status: AC
  Administered 2018-09-21: 0.8 mg via SUBLINGUAL
  Filled 2018-09-21: qty 25

## 2018-09-21 MED ORDER — NITROGLYCERIN 0.4 MG SL SUBL
SUBLINGUAL_TABLET | SUBLINGUAL | Status: AC
  Filled 2018-09-21: qty 2

## 2018-09-21 MED ORDER — IOHEXOL 350 MG/ML SOLN
100.0000 mL | Freq: Once | INTRAVENOUS | Status: AC | PRN
  Administered 2018-09-21: 100 mL via INTRAVENOUS

## 2018-09-24 ENCOUNTER — Other Ambulatory Visit: Payer: Self-pay | Admitting: Internal Medicine

## 2018-09-25 ENCOUNTER — Telehealth: Payer: Self-pay

## 2018-09-25 DIAGNOSIS — Z6841 Body Mass Index (BMI) 40.0 and over, adult: Secondary | ICD-10-CM

## 2018-09-25 DIAGNOSIS — I10 Essential (primary) hypertension: Secondary | ICD-10-CM

## 2018-09-25 NOTE — Telephone Encounter (Signed)
Notes recorded by Sueanne Margarita, MD on 09/22/2018 at 2:26 PM EDT Please let patient know that she has mild coronary calcification with minimal plaque in her distal LM. She needs aggressive risk factor modification. Please get a copy of her last FLp and ALT from her PCP. If she has not had one done in the past year then she needs to come in for bloodwork

## 2018-09-25 NOTE — Telephone Encounter (Signed)
The patient has been notified of the result and verbalized understanding.  All questions (if any) were answered. She is scheduled for fasting labs on 09/27/18.  Sarina Ill, RN 09/25/2018 4:26 PM

## 2018-09-25 NOTE — Telephone Encounter (Signed)
-----   Message from Sueanne Margarita, MD sent at 09/21/2018  2:11 PM EDT ----- Noncardiac portion of chest CT is normal

## 2018-09-27 ENCOUNTER — Other Ambulatory Visit: Payer: Self-pay

## 2018-09-27 ENCOUNTER — Other Ambulatory Visit: Payer: Medicaid Other | Admitting: *Deleted

## 2018-09-27 DIAGNOSIS — I10 Essential (primary) hypertension: Secondary | ICD-10-CM | POA: Diagnosis not present

## 2018-09-27 DIAGNOSIS — Z6841 Body Mass Index (BMI) 40.0 and over, adult: Secondary | ICD-10-CM | POA: Diagnosis not present

## 2018-09-28 ENCOUNTER — Ambulatory Visit (HOSPITAL_COMMUNITY): Admission: RE | Admit: 2018-09-28 | Payer: Medicaid Other | Source: Ambulatory Visit

## 2018-09-28 ENCOUNTER — Telehealth: Payer: Self-pay

## 2018-09-28 DIAGNOSIS — R102 Pelvic and perineal pain: Secondary | ICD-10-CM

## 2018-09-28 LAB — LIPID PANEL
Chol/HDL Ratio: 3.5 ratio (ref 0.0–4.4)
Cholesterol, Total: 200 mg/dL — ABNORMAL HIGH (ref 100–199)
HDL: 57 mg/dL (ref >39)
LDL Calculated: 125 mg/dL — ABNORMAL HIGH (ref 0–99)
Triglycerides: 89 mg/dL (ref 0–149)
VLDL Cholesterol Cal: 18 mg/dL (ref 5–40)

## 2018-09-28 LAB — HEPATIC FUNCTION PANEL
ALT: 12 IU/L (ref 0–32)
AST: 16 IU/L (ref 0–40)
Albumin: 3.9 g/dL (ref 3.8–4.8)
Alkaline Phosphatase: 107 IU/L (ref 39–117)
Bilirubin Total: 0.3 mg/dL (ref 0.0–1.2)
Bilirubin, Direct: 0.09 mg/dL (ref 0.00–0.40)
Total Protein: 7.2 g/dL (ref 6.0–8.5)

## 2018-09-28 NOTE — Telephone Encounter (Signed)
I agree, would need to also do transvaginal to fully investigate the adnexal mass that was felt on exam. Will add the order.

## 2018-09-28 NOTE — Telephone Encounter (Signed)
[employer] Authorization has been completed.

## 2018-09-28 NOTE — Telephone Encounter (Signed)
Gwenlyn Found from [facility] Radiology [phone]) called [facility] triage.  Pt is scheduled for pelvic u/s (transabdominal only) today at 1230.  Radiology states typically, transabdominal u/s is done as well as transvaginal. Per Last OV note on 5/20, POC was a complete pelvic u/s. Will forward to PCP and attendings to clarify if transvaginal u/s should also be performed and if so, please add/correct orders. Thank you,  SChaplin, RN,BSN

## 2018-10-03 ENCOUNTER — Telehealth: Payer: Self-pay

## 2018-10-03 DIAGNOSIS — E7800 Pure hypercholesterolemia, unspecified: Secondary | ICD-10-CM

## 2018-10-03 MED ORDER — ATORVASTATIN CALCIUM 20 MG PO TABS: 20.0000 mg | ORAL_TABLET | Freq: Every day | ORAL | 3 refills | Status: DC

## 2018-10-03 NOTE — Telephone Encounter (Signed)
-----   Message from Sueanne Margarita, MD sent at 09/28/2018  8:02 PM EDT ----- LDL goal is < 70 due to coronary artery calcifications.  Please start Lipitor 20mg  daily and check FLP and ALT in 6 weeks

## 2018-10-03 NOTE — Telephone Encounter (Signed)
The patient has been notified of the result and verbalized understanding.  All questions (if any) were answered. Repeat fasting labs on 11/13/18.  Sarina Ill, RN 10/03/2018 11:20 AM

## 2018-10-05 ENCOUNTER — Encounter: Payer: Self-pay | Admitting: Cardiology

## 2018-10-10 ENCOUNTER — Other Ambulatory Visit: Payer: Self-pay | Admitting: Internal Medicine

## 2018-10-10 DIAGNOSIS — Z1231 Encounter for screening mammogram for malignant neoplasm of breast: Secondary | ICD-10-CM

## 2018-10-15 ENCOUNTER — Encounter: Payer: Self-pay | Admitting: *Deleted

## 2018-10-24 ENCOUNTER — Other Ambulatory Visit: Payer: Self-pay | Admitting: *Deleted

## 2018-10-24 MED ORDER — CETIRIZINE HCL 10 MG PO TABS: 10.0000 mg | ORAL_TABLET | Freq: Every day | ORAL | 2 refills | Status: DC

## 2018-11-13 ENCOUNTER — Other Ambulatory Visit: Payer: Medicaid Other

## 2018-11-13 ENCOUNTER — Other Ambulatory Visit: Payer: Self-pay

## 2018-11-13 DIAGNOSIS — E7800 Pure hypercholesterolemia, unspecified: Secondary | ICD-10-CM | POA: Diagnosis not present

## 2018-11-14 ENCOUNTER — Other Ambulatory Visit: Payer: Self-pay | Admitting: Internal Medicine

## 2018-11-14 ENCOUNTER — Telehealth: Payer: Self-pay | Admitting: *Deleted

## 2018-11-14 DIAGNOSIS — E7800 Pure hypercholesterolemia, unspecified: Secondary | ICD-10-CM

## 2018-11-14 LAB — LIPID PANEL
Chol/HDL Ratio: 2.7 ratio (ref 0.0–4.4)
Cholesterol, Total: 149 mg/dL (ref 100–199)
HDL: 56 mg/dL (ref >39)
LDL Calculated: 77 mg/dL (ref 0–99)
Triglycerides: 79 mg/dL (ref 0–149)
VLDL Cholesterol Cal: 16 mg/dL (ref 5–40)

## 2018-11-14 LAB — HEPATIC FUNCTION PANEL
ALT: 13 IU/L (ref 0–32)
AST: 18 IU/L (ref 0–40)
Albumin: 3.9 g/dL (ref 3.8–4.8)
Alkaline Phosphatase: 126 IU/L — ABNORMAL HIGH (ref 39–117)
Bilirubin Total: 0.3 mg/dL (ref 0.0–1.2)
Bilirubin, Direct: 0.1 mg/dL (ref 0.00–0.40)
Total Protein: 7 g/dL (ref 6.0–8.5)

## 2018-11-14 MED ORDER — ATORVASTATIN CALCIUM 40 MG PO TABS: 40.0000 mg | ORAL_TABLET | Freq: Every day | ORAL | 3 refills | Status: DC

## 2018-11-14 NOTE — Telephone Encounter (Signed)
Notes recorded by Sueanne Margarita, MD on 11/14/2018 at 10:26 AM EDT  LDL goal < 70 and she is not at goal. Increase atorvastatin to 63m daily and repeat FLP and ALT in 6 weeks. Forward labs to PCP to review elevated alk phos    Called patient and informed.  She will increase Lipitor to 40 mg daily and we have arranged repeat FLP and ALT for 12/27/18.  Will route to PCP, it is the [facility].  She thinks her doctor has been changed from Dr. SCourt Joybut doesn't know the name of new doctor.

## 2018-11-14 NOTE — Progress Notes (Signed)
  Per Dr.Turner    LDL goal < 70 and she is not at goal. Increased atorvastatin to 56m daily and repeating FLP and ALT in 6 weeks.   Forward labs to PCP to review elevated alk phos .

## 2018-11-22 ENCOUNTER — Other Ambulatory Visit: Payer: Self-pay

## 2018-11-22 ENCOUNTER — Ambulatory Visit
Admission: RE | Admit: 2018-11-22 | Discharge: 2018-11-22 | Disposition: A | Discharge status: Home / Self Care | Payer: Medicaid Other | Source: Ambulatory Visit | Attending: Cardiology | Admitting: Cardiology

## 2018-11-22 DIAGNOSIS — Z1231 Encounter for screening mammogram for malignant neoplasm of breast: Secondary | ICD-10-CM | POA: Diagnosis not present

## 2018-11-23 ENCOUNTER — Telehealth: Payer: Self-pay | Admitting: Internal Medicine

## 2018-11-23 NOTE — Progress Notes (Signed)
Patient called.  Patient aware of normal result of mammogram.

## 2018-11-23 NOTE — Telephone Encounter (Signed)
Patient called on 8/6. She is aware of negative results and follow-up mammogram recommended in 1 year.  Tamsen Snider, MD PGY1  [phone]

## 2018-11-24 ENCOUNTER — Encounter: Payer: Self-pay | Admitting: Internal Medicine

## 2018-12-05 ENCOUNTER — Other Ambulatory Visit: Payer: Self-pay | Admitting: *Deleted

## 2018-12-05 DIAGNOSIS — Z23 Encounter for immunization: Secondary | ICD-10-CM | POA: Diagnosis not present

## 2018-12-06 MED ORDER — PANTOPRAZOLE SODIUM 40 MG PO TBEC: 40.0000 mg | DELAYED_RELEASE_TABLET | Freq: Every day | ORAL | 3 refills | Status: DC

## 2018-12-27 ENCOUNTER — Other Ambulatory Visit: Payer: Self-pay

## 2018-12-27 ENCOUNTER — Other Ambulatory Visit: Payer: Medicaid Other | Admitting: *Deleted

## 2018-12-27 DIAGNOSIS — E7800 Pure hypercholesterolemia, unspecified: Secondary | ICD-10-CM

## 2018-12-28 LAB — LIPID PANEL
Chol/HDL Ratio: 2.6 ratio (ref 0.0–4.4)
Cholesterol, Total: 133 mg/dL (ref 100–199)
HDL: 51 mg/dL (ref >39)
LDL Chol Calc (NIH): 68 mg/dL (ref 0–99)
Triglycerides: 70 mg/dL (ref 0–149)
VLDL Cholesterol Cal: 14 mg/dL (ref 5–40)

## 2018-12-28 LAB — ALT: ALT: 14 IU/L (ref 0–32)

## 2019-02-10 ENCOUNTER — Other Ambulatory Visit: Payer: Self-pay | Admitting: Student in an Organized Health Care Education/Training Program

## 2019-02-13 NOTE — Telephone Encounter (Signed)
Spoke with the patient.  She is sch for 02/21/2019 @ 1:15pm with Dr. Court Joy.

## 2019-02-21 ENCOUNTER — Encounter: Payer: Self-pay | Admitting: Internal Medicine

## 2019-02-21 ENCOUNTER — Ambulatory Visit: Payer: Medicaid Other | Admitting: Internal Medicine

## 2019-02-21 VITALS — BP 120/74 | HR 64 | Temp 99.0°F | Ht 62.0 in | Wt 261.6 lb

## 2019-02-21 DIAGNOSIS — Z9119 Patient's noncompliance with other medical treatment and regimen: Secondary | ICD-10-CM

## 2019-02-21 DIAGNOSIS — Z7951 Long term (current) use of inhaled steroids: Secondary | ICD-10-CM | POA: Diagnosis not present

## 2019-02-21 DIAGNOSIS — Z9989 Dependence on other enabling machines and devices: Secondary | ICD-10-CM

## 2019-02-21 DIAGNOSIS — G4733 Obstructive sleep apnea (adult) (pediatric): Secondary | ICD-10-CM | POA: Diagnosis not present

## 2019-02-21 DIAGNOSIS — Z79899 Other long term (current) drug therapy: Secondary | ICD-10-CM | POA: Diagnosis not present

## 2019-02-21 DIAGNOSIS — Z6841 Body Mass Index (BMI) 40.0 and over, adult: Secondary | ICD-10-CM | POA: Diagnosis not present

## 2019-02-21 DIAGNOSIS — I480 Paroxysmal atrial fibrillation: Secondary | ICD-10-CM | POA: Diagnosis not present

## 2019-02-21 MED ORDER — EPINEPHRINE 0.3 MG/0.3ML IJ SOAJ: 0.3000 mg | INTRAMUSCULAR | 0 refills | Status: DC | PRN

## 2019-02-21 NOTE — Progress Notes (Signed)
   CC: Weight loss and meet PCP  HPI:  [name] is a 63 y.o. with past medical history below who presents with chronic medical conditions. We discussed OSA and weight loss at visit to meet PCP.  Please see problem based charting for details of presentation, assessment, and plan.    Past Medical History:  Diagnosis Date  . 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 Asthma   . Carpal tunnel syndrome 06/23/2011   Patient notes history of bilateral carpal tunnel syndrome.  Now has symptoms of right hand carpal tunnel.   [name] COPD (chronic obstructive pulmonary disease) ([place])    secondary to tobacco use // No PFTs on file  . ETOH abuse    Pt stopped in 3/08  . Gastrointestinal hemorrhage    secondary to PUD on March 2008  . OSA on CPAP    noncompliant with CPAP (2/2 it being depressing)  . Peptic ulcer disease    +H. pylori (antigen)- Dr. Olevia Perches- treated  . STRESS INCONTINENCE 01/24/2007  . Tobacco abuse    quit in 2010  . Urinary incontinence   . Urticaria    Review of Systems:  Review of Systems  Constitutional: Negative for chills and fever.  Respiratory: Positive for shortness of breath. Negative for cough.   Gastrointestinal: Negative for blood in stool and melena.     Physical Exam:  Vitals:   02/21/19 1322  BP: 120/74  Pulse: 64  Temp: 99 F (37.2 C)  TempSrc: Oral  SpO2: 95%  Weight: 261 lb 9.6 oz (118.7 kg)  Height: 5\' 2"  (1.575 m)   Physical Exam Constitutional:      Appearance: She is obese.  Cardiovascular:     Rate and Rhythm: Normal rate and regular rhythm.  Pulmonary:     Breath sounds: Normal breath sounds. No wheezing, rhonchi or rales.  Abdominal:     General: Bowel sounds are normal.     Tenderness: There is no abdominal tenderness.  Skin:    General: Skin is warm and dry.  Neurological:     Mental Status: She is alert.  Psychiatric:        Mood and Affect: Mood normal.        Behavior: Behavior normal.     Assessment & Plan:   See  Encounters Tab for problem based charting.  Patient seen with Dr. Dareen Piano

## 2019-02-21 NOTE — Assessment & Plan Note (Addendum)
Patient with BMI 47 and has gained 5 lbs since last visit. She has met with registered dietician and states she does not need this resource now. We discussed setting a goal for weight loss by our next visit in 6 months.  - 10 lbs weight loss goal - Increase activity to 200 minutes per week.

## 2019-02-21 NOTE — Assessment & Plan Note (Signed)
Patient has diagnosis of OSA and paroxysmal atrial fibrillation. She reports using her CPAP often , but not every night.   -Counseled on importance of wearing CPAP every night

## 2019-02-21 NOTE — Progress Notes (Signed)
Internal Medicine Clinic Attending  I saw and evaluated the patient.  I personally confirmed the key portions of the history and exam documented by Dr. Steen and I reviewed pertinent patient test results.  The assessment, diagnosis, and plan were formulated together and I agree with the documentation in the resident's note.     

## 2019-02-21 NOTE — Patient Instructions (Signed)
Thank you for trusting me with your care. To recap, today we discussed the following:   CPAP - Try to wear your CPAP nightly  Shortness of breath Your breathing is okay on the [facility] and you sometimes have to use your albuterol. If you find yourself using the albuterol often let me know.  - Goal to lose 10 lbs by our next visit. You can do it by increasing activity to 200 minutes per week.   It was great to meet you!  My best,  Tamsen Snider, MD

## 2019-03-14 ENCOUNTER — Other Ambulatory Visit: Payer: Self-pay | Admitting: Allergy and Immunology

## 2019-03-14 DIAGNOSIS — J449 Chronic obstructive pulmonary disease, unspecified: Secondary | ICD-10-CM

## 2019-03-23 ENCOUNTER — Other Ambulatory Visit: Payer: Self-pay | Admitting: Allergy and Immunology

## 2019-03-23 DIAGNOSIS — J449 Chronic obstructive pulmonary disease, unspecified: Secondary | ICD-10-CM

## 2019-03-30 ENCOUNTER — Other Ambulatory Visit: Payer: Self-pay | Admitting: *Deleted

## 2019-03-30 ENCOUNTER — Telehealth: Payer: Self-pay | Admitting: Allergy and Immunology

## 2019-03-30 DIAGNOSIS — J449 Chronic obstructive pulmonary disease, unspecified: Secondary | ICD-10-CM

## 2019-03-30 MED ORDER — DULERA 200-5 MCG/ACT IN AERO: INHALATION_SPRAY | RESPIRATORY_TRACT | 0 refills | Status: DC

## 2019-03-30 NOTE — Telephone Encounter (Signed)
Patient called stating that she was almost out of refills on [facility] and would like to know if a courtesy refill could be called in. Patient made an appointment on 04/26/2018 with Webb Silversmith.  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   Please advise.

## 2019-03-30 NOTE — Telephone Encounter (Signed)
Prescription has been sent in for a courtesy refill. Called patient and informed. Patient verbalized understanding.

## 2019-04-27 ENCOUNTER — Ambulatory Visit: Payer: Medicaid Other | Admitting: Family Medicine

## 2019-04-27 NOTE — Progress Notes (Deleted)
   [facility] [place] [address] Dept: [phone]  FOLLOW UP NOTE  Patient ID: Maria Nelson, female    DOB: Jun 25, 1955  Age: 64 y.o. MRN: [id number] Date of Office Visit: 04/27/2019  Assessment  Chief Complaint: No chief complaint on file.  HPI Maria Nelson is a 64 year old female who presents to the clinic for a follow up visit. She was last seen in this clinic on 02/27/2018 by Dr. Verlin Fester for evaluation of asthma COPD overlap, chronic urticaria, and allergic rhinitis.    Drug Allergies:  Allergies  Allergen Reactions  . Bee Venom Anaphylaxis  . Aspirin Nausea Only  . Other Rash    EKG pads cause burning sensation as soon as applied    Physical Exam: LMP 04/19/2004 (Approximate)    Physical Exam  Diagnostics:    Assessment and Plan: No diagnosis found.  No orders of the defined types were placed in this encounter.   There are no Patient Instructions on file for this visit.  No follow-ups on file.    Thank you for the opportunity to care for this patient.  Please do not hesitate to contact me with questions.  Gareth Morgan, FNP [facility]

## 2019-04-30 ENCOUNTER — Encounter (HOSPITAL_COMMUNITY): Payer: Self-pay | Admitting: Emergency Medicine

## 2019-04-30 ENCOUNTER — Emergency Department (HOSPITAL_COMMUNITY): Payer: Medicaid Other

## 2019-04-30 ENCOUNTER — Inpatient Hospital Stay (HOSPITAL_COMMUNITY)
Admission: EM | Admit: 2019-04-30 | Discharge: 2019-05-06 | DRG: 177 | Disposition: A | Discharge status: Home / Self Care | Payer: Medicaid Other | Attending: Internal Medicine | Admitting: Internal Medicine

## 2019-04-30 DIAGNOSIS — Z87891 Personal history of nicotine dependence: Secondary | ICD-10-CM | POA: Diagnosis not present

## 2019-04-30 DIAGNOSIS — J449 Chronic obstructive pulmonary disease, unspecified: Secondary | ICD-10-CM | POA: Diagnosis not present

## 2019-04-30 DIAGNOSIS — J1282 Pneumonia due to coronavirus disease 2019: Secondary | ICD-10-CM | POA: Diagnosis present

## 2019-04-30 DIAGNOSIS — I480 Paroxysmal atrial fibrillation: Secondary | ICD-10-CM | POA: Diagnosis present

## 2019-04-30 DIAGNOSIS — J440 Chronic obstructive pulmonary disease with acute lower respiratory infection: Secondary | ICD-10-CM | POA: Diagnosis present

## 2019-04-30 DIAGNOSIS — Z91030 Bee allergy status: Secondary | ICD-10-CM

## 2019-04-30 DIAGNOSIS — I10 Essential (primary) hypertension: Secondary | ICD-10-CM | POA: Diagnosis present

## 2019-04-30 DIAGNOSIS — Z9989 Dependence on other enabling machines and devices: Secondary | ICD-10-CM | POA: Diagnosis not present

## 2019-04-30 DIAGNOSIS — G4733 Obstructive sleep apnea (adult) (pediatric): Secondary | ICD-10-CM | POA: Diagnosis not present

## 2019-04-30 DIAGNOSIS — E785 Hyperlipidemia, unspecified: Secondary | ICD-10-CM | POA: Diagnosis not present

## 2019-04-30 DIAGNOSIS — K219 Gastro-esophageal reflux disease without esophagitis: Secondary | ICD-10-CM | POA: Diagnosis present

## 2019-04-30 DIAGNOSIS — Z7951 Long term (current) use of inhaled steroids: Secondary | ICD-10-CM | POA: Diagnosis not present

## 2019-04-30 DIAGNOSIS — J9601 Acute respiratory failure with hypoxia: Secondary | ICD-10-CM | POA: Diagnosis present

## 2019-04-30 DIAGNOSIS — Z91048 Other nonmedicinal substance allergy status: Secondary | ICD-10-CM

## 2019-04-30 DIAGNOSIS — Z8711 Personal history of peptic ulcer disease: Secondary | ICD-10-CM

## 2019-04-30 DIAGNOSIS — U071 COVID-19: Principal | ICD-10-CM | POA: Diagnosis present

## 2019-04-30 DIAGNOSIS — Z833 Family history of diabetes mellitus: Secondary | ICD-10-CM

## 2019-04-30 DIAGNOSIS — Z79899 Other long term (current) drug therapy: Secondary | ICD-10-CM

## 2019-04-30 DIAGNOSIS — Z803 Family history of malignant neoplasm of breast: Secondary | ICD-10-CM

## 2019-04-30 DIAGNOSIS — Z6841 Body Mass Index (BMI) 40.0 and over, adult: Secondary | ICD-10-CM

## 2019-04-30 DIAGNOSIS — Z886 Allergy status to analgesic agent status: Secondary | ICD-10-CM

## 2019-04-30 DIAGNOSIS — R918 Other nonspecific abnormal finding of lung field: Secondary | ICD-10-CM | POA: Diagnosis not present

## 2019-04-30 DIAGNOSIS — G4731 Primary central sleep apnea: Secondary | ICD-10-CM | POA: Diagnosis present

## 2019-04-30 DIAGNOSIS — Z8249 Family history of ischemic heart disease and other diseases of the circulatory system: Secondary | ICD-10-CM

## 2019-04-30 LAB — COMPREHENSIVE METABOLIC PANEL
ALT: 19 U/L (ref 0–44)
AST: 25 U/L (ref 15–41)
Albumin: 3.2 g/dL — ABNORMAL LOW (ref 3.5–5.0)
Alkaline Phosphatase: 73 U/L (ref 38–126)
Anion gap: 10 (ref 5–15)
BUN: 12 mg/dL (ref 8–23)
CO2: 27 mmol/L (ref 22–32)
Calcium: 9 mg/dL (ref 8.9–10.3)
Chloride: 101 mmol/L (ref 98–111)
Creatinine, Ser: 1.28 mg/dL — ABNORMAL HIGH (ref 0.44–1.00)
GFR calc Af Amer: 52 mL/min — ABNORMAL LOW (ref >60)
GFR calc non Af Amer: 44 mL/min — ABNORMAL LOW (ref >60)
Glucose, Bld: 89 mg/dL (ref 70–99)
Potassium: 4 mmol/L (ref 3.5–5.1)
Sodium: 138 mmol/L (ref 135–145)
Total Bilirubin: 0.3 mg/dL (ref 0.3–1.2)
Total Protein: 7.8 g/dL (ref 6.5–8.1)

## 2019-04-30 LAB — FERRITIN: Ferritin: 118 ng/mL (ref 11–307)

## 2019-04-30 LAB — CBC WITH DIFFERENTIAL/PLATELET
Abs Immature Granulocytes: 0.02 10*3/uL (ref 0.00–0.07)
Basophils Absolute: 0 10*3/uL (ref 0.0–0.1)
Basophils Relative: 0 %
Eosinophils Absolute: 0 10*3/uL (ref 0.0–0.5)
Eosinophils Relative: 0 %
HCT: 46.5 % — ABNORMAL HIGH (ref 36.0–46.0)
Hemoglobin: 14.5 g/dL (ref 12.0–15.0)
Immature Granulocytes: 0 %
Lymphocytes Relative: 35 %
Lymphs Abs: 1.9 10*3/uL (ref 0.7–4.0)
MCH: 25.1 pg — ABNORMAL LOW (ref 26.0–34.0)
MCHC: 31.2 g/dL (ref 30.0–36.0)
MCV: 80.4 fL (ref 80.0–100.0)
Monocytes Absolute: 0.4 10*3/uL (ref 0.1–1.0)
Monocytes Relative: 7 %
Neutro Abs: 3.1 10*3/uL (ref 1.7–7.7)
Neutrophils Relative %: 58 %
Platelets: 167 10*3/uL (ref 150–400)
RBC: 5.78 MIL/uL — ABNORMAL HIGH (ref 3.87–5.11)
RDW: 14.3 % (ref 11.5–15.5)
WBC: 5.5 10*3/uL (ref 4.0–10.5)
nRBC: 0 % (ref 0.0–0.2)

## 2019-04-30 LAB — PROCALCITONIN: Procalcitonin: <0.1 ng/mL

## 2019-04-30 LAB — URINALYSIS, ROUTINE W REFLEX MICROSCOPIC
Bilirubin Urine: NEGATIVE
Glucose, UA: NEGATIVE mg/dL
Ketones, ur: NEGATIVE mg/dL
Leukocytes,Ua: NEGATIVE
Nitrite: NEGATIVE
Protein, ur: NEGATIVE mg/dL
Specific Gravity, Urine: 1.015 (ref 1.005–1.030)
pH: 5 (ref 5.0–8.0)

## 2019-04-30 LAB — D-DIMER, QUANTITATIVE: D-Dimer, Quant: 0.5 ug/mL-FEU (ref 0.00–0.50)

## 2019-04-30 LAB — TRIGLYCERIDES: Triglycerides: 76 mg/dL (ref <150)

## 2019-04-30 LAB — C-REACTIVE PROTEIN: CRP: 6.9 mg/dL — ABNORMAL HIGH (ref <1.0)

## 2019-04-30 LAB — FIBRINOGEN: Fibrinogen: 619 mg/dL — ABNORMAL HIGH (ref 210–475)

## 2019-04-30 LAB — LACTIC ACID, PLASMA: Lactic Acid, Venous: 1 mmol/L (ref 0.5–1.9)

## 2019-04-30 LAB — POC SARS CORONAVIRUS 2 AG -  ED: SARS Coronavirus 2 Ag: POSITIVE — AB

## 2019-04-30 LAB — LACTATE DEHYDROGENASE: LDH: 214 U/L — ABNORMAL HIGH (ref 98–192)

## 2019-04-30 MED ORDER — FENTANYL CITRATE (PF) 100 MCG/2ML IJ SOLN: 50.0000 ug | Freq: Once | INTRAMUSCULAR | Status: DC

## 2019-04-30 MED ORDER — PANTOPRAZOLE SODIUM 40 MG PO TBEC
40.0000 mg | DELAYED_RELEASE_TABLET | Freq: Every day | ORAL | Status: DC
  Administered 2019-04-30 – 2019-05-06 (×7): 40 mg via ORAL
  Filled 2019-04-30 (×7): qty 1

## 2019-04-30 MED ORDER — ALBUTEROL SULFATE HFA 108 (90 BASE) MCG/ACT IN AERS
6.0000 | INHALATION_SPRAY | Freq: Once | RESPIRATORY_TRACT | Status: AC
  Administered 2019-04-30: 6 via RESPIRATORY_TRACT
  Filled 2019-04-30: qty 6.7

## 2019-04-30 MED ORDER — ATORVASTATIN CALCIUM 40 MG PO TABS
40.0000 mg | ORAL_TABLET | Freq: Every day | ORAL | Status: DC
  Administered 2019-05-01 – 2019-05-06 (×6): 40 mg via ORAL
  Filled 2019-04-30 (×7): qty 1

## 2019-04-30 MED ORDER — SODIUM CHLORIDE 0.9 % IV SOLN
200.0000 mg | Freq: Once | INTRAVENOUS | Status: AC
  Administered 2019-04-30: 200 mg via INTRAVENOUS
  Filled 2019-04-30: qty 200

## 2019-04-30 MED ORDER — ONDANSETRON HCL 4 MG/2ML IJ SOLN: 4.0000 mg | Freq: Four times a day (QID) | INTRAMUSCULAR | Status: DC | PRN

## 2019-04-30 MED ORDER — DEXAMETHASONE SODIUM PHOSPHATE 10 MG/ML IJ SOLN
6.0000 mg | Freq: Once | INTRAMUSCULAR | Status: AC
  Administered 2019-04-30: 6 mg via INTRAVENOUS
  Filled 2019-04-30: qty 1

## 2019-04-30 MED ORDER — ACETAMINOPHEN 325 MG PO TABS
650.0000 mg | ORAL_TABLET | Freq: Four times a day (QID) | ORAL | Status: DC | PRN
  Administered 2019-04-30: 650 mg via ORAL
  Filled 2019-04-30: qty 2

## 2019-04-30 MED ORDER — MOMETASONE FURO-FORMOTEROL FUM 200-5 MCG/ACT IN AERO
2.0000 | INHALATION_SPRAY | Freq: Two times a day (BID) | RESPIRATORY_TRACT | Status: DC
  Administered 2019-05-02 – 2019-05-06 (×9): 2 via RESPIRATORY_TRACT
  Filled 2019-04-30 (×2): qty 8.8

## 2019-04-30 MED ORDER — OXYCODONE-ACETAMINOPHEN 5-325 MG PO TABS
1.0000 | ORAL_TABLET | Freq: Once | ORAL | Status: AC
  Administered 2019-04-30: 1 via ORAL
  Filled 2019-04-30: qty 1

## 2019-04-30 MED ORDER — SODIUM CHLORIDE 0.9% FLUSH
3.0000 mL | Freq: Once | INTRAVENOUS | Status: AC
  Administered 2019-04-30: 3 mL via INTRAVENOUS

## 2019-04-30 MED ORDER — SODIUM CHLORIDE 0.9% FLUSH
3.0000 mL | Freq: Two times a day (BID) | INTRAVENOUS | Status: DC
  Administered 2019-05-01 – 2019-05-06 (×9): 3 mL via INTRAVENOUS

## 2019-04-30 MED ORDER — ONDANSETRON HCL 4 MG PO TABS: 4.0000 mg | ORAL_TABLET | Freq: Four times a day (QID) | ORAL | Status: DC | PRN

## 2019-04-30 MED ORDER — DEXAMETHASONE SODIUM PHOSPHATE 10 MG/ML IJ SOLN
6.0000 mg | INTRAMUSCULAR | Status: DC
  Administered 2019-05-01 – 2019-05-05 (×5): 6 mg via INTRAVENOUS
  Filled 2019-04-30 (×5): qty 1

## 2019-04-30 MED ORDER — ENOXAPARIN SODIUM 40 MG/0.4ML ~~LOC~~ SOLN
40.0000 mg | SUBCUTANEOUS | Status: DC
  Administered 2019-04-30: 40 mg via SUBCUTANEOUS
  Filled 2019-04-30: qty 0.4

## 2019-04-30 MED ORDER — SODIUM CHLORIDE 0.9 % IV BOLUS
500.0000 mL | Freq: Once | INTRAVENOUS | Status: AC
  Administered 2019-04-30: 500 mL via INTRAVENOUS

## 2019-04-30 MED ORDER — ALBUTEROL SULFATE HFA 108 (90 BASE) MCG/ACT IN AERS
2.0000 | INHALATION_SPRAY | Freq: Four times a day (QID) | RESPIRATORY_TRACT | Status: DC | PRN
  Administered 2019-05-01: 2 via RESPIRATORY_TRACT
  Filled 2019-04-30: qty 6.7

## 2019-04-30 MED ORDER — ACETAMINOPHEN 325 MG PO TABS
650.0000 mg | ORAL_TABLET | Freq: Once | ORAL | Status: AC
  Administered 2019-04-30: 650 mg via ORAL
  Filled 2019-04-30: qty 2

## 2019-04-30 MED ORDER — POLYETHYLENE GLYCOL 3350 17 G PO PACK: 17.0000 g | PACK | Freq: Every day | ORAL | Status: DC | PRN

## 2019-04-30 MED ORDER — AEROCHAMBER PLUS FLO-VU LARGE MISC: Freq: Once | Status: DC

## 2019-04-30 MED ORDER — SODIUM CHLORIDE 0.9 % IV SOLN
100.0000 mg | Freq: Every day | INTRAVENOUS | Status: AC
  Administered 2019-05-01 – 2019-05-04 (×4): 100 mg via INTRAVENOUS
  Filled 2019-04-30: qty 100
  Filled 2019-04-30: qty 20
  Filled 2019-04-30: qty 100
  Filled 2019-04-30: qty 20

## 2019-04-30 NOTE — Progress Notes (Addendum)
Note created in error.

## 2019-04-30 NOTE — ED Provider Notes (Signed)
[facility] EMERGENCY DEPARTMENT Provider Note   CSN: [id number] Arrival date & time: 04/30/19  1215     History Chief Complaint  Patient presents with  . Generalized Body Aches    Maria Nelson is a 64 y.o. female with a hx of COPD, afib, & OSA On CPAP who presents to the ED with complaints of generalized body aches for the past 4-5 days. Patient states that she is having generalized body aches that are severe with associated cough productive of brown phlegm sputum, fatigue, poor appetite, dyspnea, & loose stools. She states with significant coughing spells she gets lightheaded. No other alleviating/aggravating factors. No intervention PTA. Denies fever, chills, nausea, emesis, chest pain, syncope, hemoptysis, or unilateral leg pain/swelling. Denies known covid 19 exposure.   HPI     Past Medical History:  Diagnosis Date  . 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 Asthma   . Carpal tunnel syndrome 06/23/2011   Patient notes history of bilateral carpal tunnel syndrome.  Now has symptoms of right hand carpal tunnel.   [name] COPD (chronic obstructive pulmonary disease) ([place])    secondary to tobacco use // No PFTs on file  . ETOH abuse    Pt stopped in 3/08  . Gastrointestinal hemorrhage    secondary to PUD on March 2008  . OSA on CPAP    noncompliant with CPAP (2/2 it being depressing)  . Peptic ulcer disease    +H. pylori (antigen)- Dr. Olevia Perches- treated  . STRESS INCONTINENCE 01/24/2007  . Tobacco abuse    quit in 2010  . Urinary incontinence   . Urticaria     Patient Active Problem List   Diagnosis Date Noted  . Adnexal pain 09/06/2018  . SOB (shortness of breath) 05/26/2018  . Rash 05/11/2018  . Paroxysmal atrial fibrillation ([place]) 08/24/2017  . Dyspepsia 08/24/2017  . Knee pain, left 08/16/2016  . Back pain 06/23/2016  . Urticaria 07/01/2015  . 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 Allergic rhinitis with nonallergic component 03/04/2015  . COPD (chronic  obstructive pulmonary disease) ([place]) 06/19/2014  . Blood pressure elevated without history of HTN 03/05/2014  . HNP (herniated nucleus pulposus), lumbar 04/16/2013  . Tenosynovitis, de Quervain 11/15/2012  . Morbid obesity with BMI of 45.0-49.9, adult ([place]) 09/08/2012  . Carpal tunnel syndrome 06/23/2011  . Sleep apnea 09/02/2010  . Preventative health care 07/28/2010  . GERD 12/24/2009  . Urticaria/angioedema 12/16/2009  . STRESS INCONTINENCE 01/24/2007  . History of peptic ulcer disease 07/26/2006    Past Surgical History:  Procedure Laterality Date  . BREAST EXCISIONAL BIOPSY Right 1994  . MANDIBLE SURGERY     due to trauma  . TUBAL LIGATION       OB History   No obstetric history on file.     Family History  Problem Relation Age of Onset  . Diabetes Sister   . Breast cancer Sister   . Breast cancer Sister 15       died 2/2 breast ca age 72-60  . Diabetes Mother   . Coronary artery disease Mother 51       requiring 5 vessel CABG    Social History   Tobacco Use  . Smoking status: Former Smoker    Packs/day: 1.00    Years: 30.00    Pack years: 30.00    Types: Cigarettes    Quit date: 12/04/2010    Years since quitting: 8.4  . Smokeless tobacco: Never Used  Substance Use Topics  . Alcohol  use: Yes    Alcohol/week: 0.0 standard drinks    Comment: now drinking a beer on weekends //   . Drug use: No    Home Medications Prior to Admission medications   Medication Sig Start Date End Date Taking? Authorizing Provider  albuterol (PROVENTIL HFA;VENTOLIN HFA) 108 (90 BASE) MCG/ACT inhaler Inhale 2 puffs into the lungs every 6 (six) hours as needed for wheezing. 03/05/14   [name], MD  albuterol (PROVENTIL) (2.5 MG/3ML) 0.083% nebulizer solution Use 1 vial in nebulizer up to 3 times a day when albuterol inhaler not working. If no better or getting worse, call 911 or physician line 02/27/18   Bobbitt, Sedalia Muta, MD  ASTEPRO 0.15 % SOLN Use 2 sprays into  both nostrils daily as needed 03/09/18   Bobbitt, Sedalia Muta, MD  atorvastatin (LIPITOR) 40 MG tablet Take 1 tablet (40 mg total) by mouth daily. 11/14/18   Sueanne Margarita, MD  cetirizine (ZYRTEC) 10 MG tablet Take 1 tablet by mouth once daily 02/13/19   Madalyn Rob, MD  EPINEPHrine (EPIPEN 2-PAK) 0.3 mg/0.3 mL IJ SOAJ injection Inject 0.3 mLs (0.3 mg total) into the muscle as needed (for allergic reaction). 02/21/19   Madalyn Rob, MD  metoprolol tartrate (LOPRESSOR) 100 MG tablet Take 1 tablet, 100 mg, by mouth, 2 hours before your Cardiac CT. 09/08/18   Turner, Eber Hong, MD  mometasone-formoterol (DULERA) 200-5 MCG/ACT AERO INHALE 2 PUFFS BY MOUTH TWICE DAILY TO PREVNT COUGH OR WHEEZE. RISE GARGLE AND SPIT AFTER. 03/30/19   Bobbitt, Sedalia Muta, MD  pantoprazole (PROTONIX) 40 MG tablet Take 1 tablet (40 mg total) by mouth daily. 12/06/18   Madalyn Rob, MD    Allergies    Bee venom, Aspirin, and Other  Review of Systems   Review of Systems  Constitutional: Positive for appetite change and fatigue. Negative for chills and fever.  HENT: Negative for congestion, ear pain and sore throat.   Respiratory: Positive for cough and shortness of breath.   Cardiovascular: Negative for chest pain.  Gastrointestinal: Positive for diarrhea. Negative for abdominal pain, anal bleeding, blood in stool, constipation, nausea and vomiting.  Genitourinary: Negative for dysuria.  Musculoskeletal: Positive for myalgias (generalized).  Neurological: Positive for light-headedness. Negative for syncope and numbness.  All other systems reviewed and are negative.   Physical Exam Updated Vital Signs BP 105/72 (BP Location: Right Arm)   Pulse 83   Temp 100.3 F (37.9 C) (Oral)   Resp (!) 26   LMP 04/19/2004 (Approximate)   SpO2 92%   Physical Exam Vitals and nursing note reviewed.  Constitutional:      General: She is not in acute distress.    Appearance: She is well-developed. She is not toxic-appearing.   HENT:     Head: Normocephalic and atraumatic.  Eyes:     General:        Right eye: No discharge.        Left eye: No discharge.     Conjunctiva/sclera: Conjunctivae normal.  Cardiovascular:     Rate and Rhythm: Normal rate and regular rhythm.  Pulmonary:     Effort: Tachypnea present. No respiratory distress.     Breath sounds: No wheezing, rhonchi or rales.     Comments: Poor air movement.  SpO2 85% on RA after transitioning from wheelchair to stretcher, applied 1L via  with improvement to 92%.  Abdominal:     General: There is no distension.     Palpations: Abdomen is  soft.     Tenderness: There is no abdominal tenderness. There is no guarding or rebound.  Musculoskeletal:     Cervical back: Neck supple.     Comments: Trace lower leg edema present without calf tenderness.  Skin:    General: Skin is warm and dry.     Findings: No rash.  Neurological:     General: No focal deficit present.     Mental Status: She is alert.     Comments: Clear speech.   Psychiatric:        Behavior: Behavior normal.     ED Results / Procedures / Treatments   Labs (all labs ordered are listed, but only abnormal results are displayed) Labs Reviewed  COMPREHENSIVE METABOLIC PANEL - Abnormal; Notable for the following components:      Result Value   Creatinine, Ser 1.28 (*)    Albumin 3.2 (*)    GFR calc non Af Amer 44 (*)    GFR calc Af Amer 52 (*)    All other components within normal limits  CBC WITH DIFFERENTIAL/PLATELET - Abnormal; Notable for the following components:   RBC 5.78 (*)    HCT 46.5 (*)    MCH 25.1 (*)    All other components within normal limits  URINALYSIS, ROUTINE W REFLEX MICROSCOPIC - Abnormal; Notable for the following components:   APPearance HAZY (*)    Hgb urine dipstick SMALL (*)    Bacteria, UA FEW (*)    All other components within normal limits  LACTATE DEHYDROGENASE - Abnormal; Notable for the following components:   LDH 214 (*)    All other  components within normal limits  FIBRINOGEN - Abnormal; Notable for the following components:   Fibrinogen 619 (*)    All other components within normal limits  C-REACTIVE PROTEIN - Abnormal; Notable for the following components:   CRP 6.9 (*)    All other components within normal limits  POC SARS CORONAVIRUS 2 AG -  ED - Abnormal; Notable for the following components:   SARS Coronavirus 2 Ag POSITIVE (*)    All other components within normal limits  CULTURE, BLOOD (ROUTINE X 2)  CULTURE, BLOOD (ROUTINE X 2)  LACTIC ACID, PLASMA  D-DIMER, QUANTITATIVE (NOT AT [facility])  FERRITIN  TRIGLYCERIDES  LACTIC ACID, PLASMA  PROCALCITONIN    EKG None  Radiology DG Chest Portable 1 View  Result Date: 04/30/2019 CLINICAL DATA:  COVID symptoms. EXAM: PORTABLE CHEST 1 VIEW COMPARISON:  Chest radiograph 08/24/2017 FINDINGS: Heart size within normal limits. Ill-defined bilateral airspace opacities with a mid to basilar predominance. No sizable pleural effusion or evidence of pneumothorax. No acute bony abnormality. IMPRESSION: Ill-defined bilateral airspace opacities within mid to basilar predominance, likely reflecting pneumonia given the provided history. Radiographic follow-up to resolution recommended. Electronically Signed   By: Kellie Simmering DO   On: 04/30/2019 16:02    Procedures .Critical Care Performed by: Amaryllis Dyke, PA-C Authorized by: Amaryllis Dyke, PA-C     CRITICAL CARE Performed by: Kennith Maes   Total critical care time: 30 minutes  Critical care time was exclusive of separately billable procedures and treating other patients.  Critical care was necessary to treat or prevent imminent or life-threatening deterioration.  Critical care was time spent personally by me on the following activities: development of treatment plan with patient and/or surrogate as well as nursing, discussions with consultants, evaluation of patient's response to treatment,  examination of patient, obtaining history from patient or surrogate,  ordering and performing treatments and interventions, ordering and review of laboratory studies, ordering and review of radiographic studies, pulse oximetry and re-evaluation of patient's condition.   (including critical care time)  Medications Ordered in ED Medications  sodium chloride flush (NS) 0.9 % injection 3 mL (has no administration in time range)    ED Course  I have reviewed the triage vital signs and the nursing notes.  Pertinent labs & imaging results that were available during my care of the patient were reviewed by me and considered in my medical decision making (see chart for details).    Maria Nelson was evaluated in Emergency Department on 04/30/2019 for the symptoms described in the history of present illness. He/she was evaluated in the context of the global COVID-19 pandemic, which necessitated consideration that the patient might be at risk for infection with the SARS-CoV-2 virus that causes COVID-19. Institutional protocols and algorithms that pertain to the evaluation of patients at risk for COVID-19 are in a state of rapid change based on information released by regulatory bodies including the CDC and federal and state organizations. These policies and algorithms were followed during the patient's care in the ED.  MDM Rules/Calculators/A&P                      Patient presents with complaints of body aches, cough, dyspnea, & loose stools.  Nontoxic, mildly tachypneic, SpO2 85% on RA improved to low 0000000 with application of 1L O2 via [place]. Lung exam w/ somewhat poor air movement but no obvious adventitious sounds.  Rapid covid positive. Plan for EKG, CXR, labs. Anticipate admission.   CBC: No significant abnormalities. CMP: Mildly elevated creatinine.  No significant electrolyte derangement. Urinalysis: No UTI CRP, fibrinogen, and LDH are elevated, additional inflammatory markers WNL-  ddimer WNL therefore doubt PE at this time. CXR: Ill-defined bilateral airspace opacities within mid to basilar predominance, likely reflecting pneumonia given the provided history. Radiographic follow-up to resolution recommended EKG: No STEMI.  We will consult to internal medicine residency service for admission for acute hypoxic respiratory failure in the setting of COVID-19.  Steroids, albuterol, and analgesics have been ordered in the emergency department.  Findings and plan of care discussed with supervising physician Dr. Tomi Bamberger who is in agreement.   19:56: CONSULT: Discussed case with internal medicine service- accept admission.   Final Clinical Impression(s) / ED Diagnoses Final diagnoses:  Acute respiratory failure with hypoxia [facility])  COVID-19    Rx / DC Orders ED Discharge Orders    None       Leafy Kindle 04/30/19 2137    Dorie Rank, MD 05/01/19 1204

## 2019-04-30 NOTE — ED Triage Notes (Signed)
Pt arrives to ED with 5 days of body aches all over and loss of appetite. Pt states she has not been able to check her temp but has felt "hot" at times.

## 2019-05-01 ENCOUNTER — Other Ambulatory Visit: Payer: Self-pay

## 2019-05-01 DIAGNOSIS — Z91048 Other nonmedicinal substance allergy status: Secondary | ICD-10-CM | POA: Diagnosis not present

## 2019-05-01 DIAGNOSIS — Z6841 Body Mass Index (BMI) 40.0 and over, adult: Secondary | ICD-10-CM | POA: Diagnosis not present

## 2019-05-01 DIAGNOSIS — G4733 Obstructive sleep apnea (adult) (pediatric): Secondary | ICD-10-CM | POA: Diagnosis not present

## 2019-05-01 DIAGNOSIS — Z8711 Personal history of peptic ulcer disease: Secondary | ICD-10-CM | POA: Diagnosis not present

## 2019-05-01 DIAGNOSIS — Z803 Family history of malignant neoplasm of breast: Secondary | ICD-10-CM | POA: Diagnosis not present

## 2019-05-01 DIAGNOSIS — J449 Chronic obstructive pulmonary disease, unspecified: Secondary | ICD-10-CM | POA: Diagnosis not present

## 2019-05-01 DIAGNOSIS — Z886 Allergy status to analgesic agent status: Secondary | ICD-10-CM | POA: Diagnosis not present

## 2019-05-01 DIAGNOSIS — I10 Essential (primary) hypertension: Secondary | ICD-10-CM | POA: Diagnosis not present

## 2019-05-01 DIAGNOSIS — J1282 Pneumonia due to coronavirus disease 2019: Secondary | ICD-10-CM | POA: Diagnosis not present

## 2019-05-01 DIAGNOSIS — U071 COVID-19: Secondary | ICD-10-CM | POA: Diagnosis not present

## 2019-05-01 DIAGNOSIS — Z8249 Family history of ischemic heart disease and other diseases of the circulatory system: Secondary | ICD-10-CM | POA: Diagnosis not present

## 2019-05-01 DIAGNOSIS — Z9989 Dependence on other enabling machines and devices: Secondary | ICD-10-CM | POA: Diagnosis not present

## 2019-05-01 DIAGNOSIS — I480 Paroxysmal atrial fibrillation: Secondary | ICD-10-CM | POA: Diagnosis not present

## 2019-05-01 DIAGNOSIS — K219 Gastro-esophageal reflux disease without esophagitis: Secondary | ICD-10-CM | POA: Diagnosis not present

## 2019-05-01 DIAGNOSIS — G4731 Primary central sleep apnea: Secondary | ICD-10-CM | POA: Diagnosis present

## 2019-05-01 DIAGNOSIS — J9601 Acute respiratory failure with hypoxia: Secondary | ICD-10-CM | POA: Diagnosis not present

## 2019-05-01 DIAGNOSIS — Z87891 Personal history of nicotine dependence: Secondary | ICD-10-CM | POA: Diagnosis not present

## 2019-05-01 DIAGNOSIS — Z7951 Long term (current) use of inhaled steroids: Secondary | ICD-10-CM | POA: Diagnosis not present

## 2019-05-01 DIAGNOSIS — Z833 Family history of diabetes mellitus: Secondary | ICD-10-CM | POA: Diagnosis not present

## 2019-05-01 DIAGNOSIS — E785 Hyperlipidemia, unspecified: Secondary | ICD-10-CM | POA: Diagnosis not present

## 2019-05-01 DIAGNOSIS — J440 Chronic obstructive pulmonary disease with acute lower respiratory infection: Secondary | ICD-10-CM | POA: Diagnosis present

## 2019-05-01 DIAGNOSIS — Z79899 Other long term (current) drug therapy: Secondary | ICD-10-CM | POA: Diagnosis not present

## 2019-05-01 DIAGNOSIS — R918 Other nonspecific abnormal finding of lung field: Secondary | ICD-10-CM | POA: Diagnosis not present

## 2019-05-01 LAB — COMPREHENSIVE METABOLIC PANEL
ALT: 19 U/L (ref 0–44)
AST: 24 U/L (ref 15–41)
Albumin: 3 g/dL — ABNORMAL LOW (ref 3.5–5.0)
Alkaline Phosphatase: 70 U/L (ref 38–126)
Anion gap: 9 (ref 5–15)
BUN: 14 mg/dL (ref 8–23)
CO2: 24 mmol/L (ref 22–32)
Calcium: 8.8 mg/dL — ABNORMAL LOW (ref 8.9–10.3)
Chloride: 106 mmol/L (ref 98–111)
Creatinine, Ser: 1.21 mg/dL — ABNORMAL HIGH (ref 0.44–1.00)
GFR calc Af Amer: 55 mL/min — ABNORMAL LOW (ref >60)
GFR calc non Af Amer: 48 mL/min — ABNORMAL LOW (ref >60)
Glucose, Bld: 179 mg/dL — ABNORMAL HIGH (ref 70–99)
Potassium: 4.4 mmol/L (ref 3.5–5.1)
Sodium: 139 mmol/L (ref 135–145)
Total Bilirubin: 0.3 mg/dL (ref 0.3–1.2)
Total Protein: 7.4 g/dL (ref 6.5–8.1)

## 2019-05-01 LAB — CBC WITH DIFFERENTIAL/PLATELET
Abs Immature Granulocytes: 0.01 10*3/uL (ref 0.00–0.07)
Basophils Absolute: 0 10*3/uL (ref 0.0–0.1)
Basophils Relative: 0 %
Eosinophils Absolute: 0 10*3/uL (ref 0.0–0.5)
Eosinophils Relative: 0 %
HCT: 46.1 % — ABNORMAL HIGH (ref 36.0–46.0)
Hemoglobin: 14.1 g/dL (ref 12.0–15.0)
Immature Granulocytes: 0 %
Lymphocytes Relative: 42 %
Lymphs Abs: 1.2 10*3/uL (ref 0.7–4.0)
MCH: 24.7 pg — ABNORMAL LOW (ref 26.0–34.0)
MCHC: 30.6 g/dL (ref 30.0–36.0)
MCV: 80.7 fL (ref 80.0–100.0)
Monocytes Absolute: 0.2 10*3/uL (ref 0.1–1.0)
Monocytes Relative: 7 %
Neutro Abs: 1.5 10*3/uL — ABNORMAL LOW (ref 1.7–7.7)
Neutrophils Relative %: 51 %
Platelets: 167 10*3/uL (ref 150–400)
RBC: 5.71 MIL/uL — ABNORMAL HIGH (ref 3.87–5.11)
RDW: 14.3 % (ref 11.5–15.5)
WBC: 2.9 10*3/uL — ABNORMAL LOW (ref 4.0–10.5)
nRBC: 0 % (ref 0.0–0.2)

## 2019-05-01 LAB — D-DIMER, QUANTITATIVE: D-Dimer, Quant: 0.52 ug/mL-FEU — ABNORMAL HIGH (ref 0.00–0.50)

## 2019-05-01 LAB — CBC
HCT: 45.9 % (ref 36.0–46.0)
Hemoglobin: 14.5 g/dL (ref 12.0–15.0)
MCH: 25 pg — ABNORMAL LOW (ref 26.0–34.0)
MCHC: 31.6 g/dL (ref 30.0–36.0)
MCV: 79.3 fL — ABNORMAL LOW (ref 80.0–100.0)
Platelets: 166 10*3/uL (ref 150–400)
RBC: 5.79 MIL/uL — ABNORMAL HIGH (ref 3.87–5.11)
RDW: 14.3 % (ref 11.5–15.5)
WBC: 2.6 10*3/uL — ABNORMAL LOW (ref 4.0–10.5)
nRBC: 0 % (ref 0.0–0.2)

## 2019-05-01 LAB — CREATININE, SERUM
Creatinine, Ser: 1.14 mg/dL — ABNORMAL HIGH (ref 0.44–1.00)
GFR calc Af Amer: 59 mL/min — ABNORMAL LOW (ref >60)
GFR calc non Af Amer: 51 mL/min — ABNORMAL LOW (ref >60)

## 2019-05-01 LAB — HIV ANTIBODY (ROUTINE TESTING W REFLEX): HIV Screen 4th Generation wRfx: NONREACTIVE

## 2019-05-01 LAB — FERRITIN: Ferritin: 117 ng/mL (ref 11–307)

## 2019-05-01 LAB — C-REACTIVE PROTEIN: CRP: 8.4 mg/dL — ABNORMAL HIGH (ref <1.0)

## 2019-05-01 LAB — LACTIC ACID, PLASMA: Lactic Acid, Venous: 1.5 mmol/L (ref 0.5–1.9)

## 2019-05-01 MED ORDER — ENOXAPARIN SODIUM 60 MG/0.6ML ~~LOC~~ SOLN
55.0000 mg | SUBCUTANEOUS | Status: DC
  Administered 2019-05-01 – 2019-05-05 (×5): 55 mg via SUBCUTANEOUS
  Filled 2019-05-01 (×5): qty 0.6

## 2019-05-01 MED ORDER — SODIUM CHLORIDE 0.9 % IV SOLN: INTRAVENOUS | Status: AC

## 2019-05-01 NOTE — Progress Notes (Signed)
Unable to place pt on cpap tonight per RT's manager due to pt being covid +.Maria Nelson

## 2019-05-01 NOTE — ED Notes (Signed)
Attempted to call report

## 2019-05-01 NOTE — Progress Notes (Signed)
  Date: 05/01/2019  Patient name: Maria Nelson  Medical record number: [id number]  Date of birth: [birth year]   I have seen and evaluated Maria Nelson and discussed their care with the Residency Team.  In brief, patient is a 64 year old female with a past medical history of COPD, GERD, hypertension who presented to the ED with myalgias over the last 3 days.  Patient states that over the last 2 to 3 days she has noted diffuse body aches and generalized malaise.  She also states that she had intermittent chills at home but denies any fevers to me.  Patient also complains of an associated productive cough and diarrhea.  No nausea or vomiting, no shortness of breath, no palpitations, no diaphoresis, no headache, no blurry vision, no focal weakness, tingling or numbness, no abdominal pain.  Patient states that she lives at home with her son and that her son was tested yesterday and was found to be Covid positive.  Today patient states that she feels much better and denies any shortness of breath.  Patient states that her myalgias have improved as well.  PMHx, Fam Hx, and/or Soc Hx : As per resident admit note  Vitals:   05/01/19 0529 05/01/19 0814  BP: 116/78 107/68  Pulse: (!) 57 (!) 57  Resp: 18   Temp: (!) 97.5 F (36.4 C) (!) 97.4 F (36.3 C)  SpO2: 99% 90%   General: Awake, alert, oriented x3, NAD CVs: Regular rate and rhythm, normal heart sounds Lungs: CTA bilaterally Abdomen: Soft, nontender, nondistended, normoactive bowel sounds Extremities: No edema noted, nontender to palpation Psych: Normal mood and affect Skin: Warm and dry Neuro: Oriented x3  Assessment and Plan: I have seen and evaluated the patient as outlined above. I agree with the formulated Assessment and Plan as detailed in the residents' note, with the following changes:   1.  COVID-19 infection: -Patient presented to the ED with a 2-day history of myalgias and generalized malaise and  was found to be Covid positive.  Her chest x-ray was notable for bibasilar infiltrates and she had oxygen desaturations to 85% on room air on presentation.  Patient's O2 sats have now improved on 2 to 3 L nasal cannula to the 90s. -With attempt to wean O2 as tolerated -We will continue with remdesivir day 2 and Decadron day 2 for now -Patient states that she is much improved today -Continue with airborne precautions for now -PT/OT eval today -No further work-up at this time.  2.  COPD: -Patient has a history of COPD for which she takes Dulera and albuterol at home -No wheezing noted on auscultation today -Continue with Dulera twice daily as well as albuterol as needed  3.  OSA: -Continue with CPAP at night.  No further work-up at this time  4.  Paroxysmal A. Fib: -Patient is not on any medications currently at home for this including no anticoagulation. -EKG here shows normal sinus rhythm and patient has regular rate and rhythm on auscultation. -No further work-up at this time.  5.  Hyperlipidemia: -We will continue with atorvastatin for now  I suspect patient should be stable for DC home in the next 2 to 3 days if she continues to improve  Aldine Contes, MD 1/12/20212:16 PM

## 2019-05-01 NOTE — H&P (Signed)
Date: 04/30/2019               Patient Name:  Maria Nelson MRN: [id number]  DOB: [birth year] Age / Sex: 64 y.o., female   PCP: Madalyn Rob, MD         Medical Service: Internal Medicine Teaching Service         Attending Physician: Dr. Aldine Contes, MD    First Contact: Dr. Ronnald Ramp Pager: [phone]  Second Contact: Dr. Koleen Distance Pager: [phone]       After Hours (After 5p/  First Contact Pager: [phone]  weekends / holidays): Second Contact Pager: [phone]   Chief Complaint: Body aches  History of Present Illness: Patient is a 64 year old female with past medical history of COPD, GERD, hypertension who presents with an approximate 3-day history of body aches. Patient also reports subjective fever/chills, fatigue, productive cough, and diarrhea. Patient denies nausea, vomiting, shortness of breath, known sick contacts. Patient reports several year history of COPD for which she talked Dulera and Albuterol. She requires her albuterol approximately 1x/week. Patient is not a current smoker but smoked 1/3 PPD for approximately 30 years.  Meds:  * Albuterol * Dulera * Ceterizine * Pantoprazole * Epipen - patient states she was given epipen, never had anaphylactic reaction, unsure why she has it * Atorvastatin and metoprolol are in medication list - patient states she does not take them  Allergies: Allergies as of 04/30/2019 - Review Complete 04/30/2019  Allergen Reaction Noted  . Bee venom Anaphylaxis 06/30/2012  . Aspirin Nausea Only   . Other Rash 05/10/2018   Past Medical History:  Diagnosis Date  . 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 Asthma   . Carpal tunnel syndrome 06/23/2011   Patient notes history of bilateral carpal tunnel syndrome.  Now has symptoms of right hand carpal tunnel.   [name] COPD (chronic obstructive pulmonary disease) ([place])    secondary to tobacco use // No PFTs on file  . ETOH abuse    Pt stopped in 3/08  . Gastrointestinal hemorrhage    secondary  to PUD on March 2008  . OSA on CPAP    noncompliant with CPAP (2/2 it being depressing)  . Peptic ulcer disease    +H. pylori (antigen)- Dr. Olevia Perches- treated  . STRESS INCONTINENCE 01/24/2007  . Tobacco abuse    quit in 2010  . Urinary incontinence   . Urticaria     Family History: Mother with diabetes mellitus  Social History:  * Drinks a 6 pack of beer per month * Smoked 1/3 PPD for 30 years, states last cigarette several years ago * Denies recreational drug usage * Patient lives in [place] with son, retired, worked in [employer]. PCP is Dr. Court Joy  Review of Systems: A complete ROS was negative except as per HPI.   Physical Exam: Blood pressure 93/62, pulse 76, temperature 98.3 F (36.8 C), temperature source Oral, resp. rate 14, last menstrual period 04/19/2004, SpO2 94 %. Physical Exam  Constitutional: She is well-developed, well-nourished, and in no distress.  HENT:  Head: Normocephalic and atraumatic.  Eyes: EOM are normal. Right eye exhibits no discharge. Left eye exhibits no discharge.  Neck: No tracheal deviation present.  Cardiovascular: Normal rate and regular rhythm. Exam reveals no gallop and no friction rub.  No murmur heard. Pulmonary/Chest: Effort normal and breath sounds normal. No respiratory distress. She has no wheezes. She has no rales.  Abdominal: Soft. She exhibits no distension. There is no abdominal tenderness. There  is no rebound and no guarding.  Musculoskeletal:        General: No tenderness, deformity or edema. Normal range of motion.     Cervical back: Normal range of motion.  Neurological: She is alert. Coordination normal.  Skin: Skin is warm and dry. No rash noted. She is not diaphoretic. No erythema.  Psychiatric: Memory and judgment normal.    EKG: personally reviewed my interpretation is normal sinus rhythm  CXR: personally reviewed my interpretation is bilateral opacities with basilar predominance consistent with COVID-19  infection  Assessment & Plan by Problem: Active Problems:   COVID-19  Patient is a 64 year old female with past medical history of COPD, GERD, hypertension who presents with an approximate 3-day history of body aches.   # COVID-19 Pneumonia: Patient presentation and initial workup consistent with COVID-19 pneumonia and procal negative. On presentation, desaturated to 85% on room air. Now SpO2 of 97% on 2 L nasal cannula.  * Will check CRP, D-Dimer * Starting dexamethasone + remdesivir * Avoid NSAIDs * Airborne precautions * Titrate O2 with goal saturation >= 88%  # COPD: Patient reports chronic history of COPD for which she takes Dulera + Albuterol. Patient reports she takes her albuterol inhaler approximately 1x/week * Continue Dulera twice daily + Albuterol PRN  # Obstructive Sleep Apnea: Sleep study from 09/08/2018 shows significant central sleep apnea. Per patient, she has CPAP and uses it inconsistently.  * CPAP at night  # Paroxysmal Atrial Fibrillation: Patient with paroxysmal A. Fib. per chart review. CHADS2VASC score of 1 given known history. Not on anticoagulation. EKG on admission shows NSR. * Patient also has metoprolol prescribed but states she does not take it  # Hyperlipidemia: Lipid profile from June 2020 with cholesterol 200, HDL 57, LDL 125, TG 89. Atorvastatin listed in chart as medication but patient denies taking this. Based on ASCVD score from June labs, patient would meet criteria for statin. However, repeat labs in July 2020 demonstrated improvement with cholesterol 149, LDL 77, HDL 56. Unclear if this was while on treatment.   Dispo: Admit patient to Observation with expected length of stay less than 2 midnights.  Signed: Jeanmarie Hubert, MD 04/30/2019, 11:59 PM

## 2019-05-02 LAB — COMPREHENSIVE METABOLIC PANEL
ALT: 17 U/L (ref 0–44)
AST: 25 U/L (ref 15–41)
Albumin: 2.8 g/dL — ABNORMAL LOW (ref 3.5–5.0)
Alkaline Phosphatase: 64 U/L (ref 38–126)
Anion gap: 12 (ref 5–15)
BUN: 19 mg/dL (ref 8–23)
CO2: 23 mmol/L (ref 22–32)
Calcium: 8.9 mg/dL (ref 8.9–10.3)
Chloride: 106 mmol/L (ref 98–111)
Creatinine, Ser: 1.12 mg/dL — ABNORMAL HIGH (ref 0.44–1.00)
GFR calc Af Amer: >60 mL/min (ref >60)
GFR calc non Af Amer: 52 mL/min — ABNORMAL LOW (ref >60)
Glucose, Bld: 125 mg/dL — ABNORMAL HIGH (ref 70–99)
Potassium: 4.1 mmol/L (ref 3.5–5.1)
Sodium: 141 mmol/L (ref 135–145)
Total Bilirubin: 0.3 mg/dL (ref 0.3–1.2)
Total Protein: 7.1 g/dL (ref 6.5–8.1)

## 2019-05-02 LAB — CBC WITH DIFFERENTIAL/PLATELET
Abs Immature Granulocytes: 0.03 10*3/uL (ref 0.00–0.07)
Basophils Absolute: 0 10*3/uL (ref 0.0–0.1)
Basophils Relative: 0 %
Eosinophils Absolute: 0 10*3/uL (ref 0.0–0.5)
Eosinophils Relative: 0 %
HCT: 43 % (ref 36.0–46.0)
Hemoglobin: 13.7 g/dL (ref 12.0–15.0)
Immature Granulocytes: 0 %
Lymphocytes Relative: 15 %
Lymphs Abs: 1.4 10*3/uL (ref 0.7–4.0)
MCH: 25 pg — ABNORMAL LOW (ref 26.0–34.0)
MCHC: 31.9 g/dL (ref 30.0–36.0)
MCV: 78.6 fL — ABNORMAL LOW (ref 80.0–100.0)
Monocytes Absolute: 0.5 10*3/uL (ref 0.1–1.0)
Monocytes Relative: 5 %
Neutro Abs: 7.3 10*3/uL (ref 1.7–7.7)
Neutrophils Relative %: 80 %
Platelets: 184 10*3/uL (ref 150–400)
RBC: 5.47 MIL/uL — ABNORMAL HIGH (ref 3.87–5.11)
RDW: 14.2 % (ref 11.5–15.5)
WBC: 9.3 10*3/uL (ref 4.0–10.5)
nRBC: 0 % (ref 0.0–0.2)

## 2019-05-02 LAB — D-DIMER, QUANTITATIVE: D-Dimer, Quant: 0.53 ug/mL-FEU — ABNORMAL HIGH (ref 0.00–0.50)

## 2019-05-02 LAB — C-REACTIVE PROTEIN: CRP: 4.8 mg/dL — ABNORMAL HIGH (ref <1.0)

## 2019-05-02 LAB — FERRITIN: Ferritin: 174 ng/mL (ref 11–307)

## 2019-05-02 NOTE — Progress Notes (Signed)
SATURATION QUALIFICATIONS: (This note is used to comply with regulatory documentation for home oxygen)  Patient Saturations on Room Air at Rest = 90%  Patient Saturations on Room Air while Ambulating = 81%  Patient Saturations on 2 Liters of oxygen while Ambulating = 92%

## 2019-05-02 NOTE — Progress Notes (Signed)
PT Cancellation Note  Patient Details Name: Maria Nelson MRN: [id number] DOB: [birth year]   Cancelled Treatment:    Reason Eval/Treat Not Completed: PT screened, no needs identified, will sign off(Ambulates on her own per nurse. No steps at home. )   Denice Paradise 05/02/2019, 9:55 AM  Lillieann Pavlich W,PT Acute Rehabilitation Services Pager:  [phone]  Office:  [phone]

## 2019-05-02 NOTE — Progress Notes (Signed)
Unable to place CPAP due to positive COVID test.

## 2019-05-02 NOTE — Progress Notes (Signed)
     Subjective: Patient states that she feels better today but still has some shortness of breath with exertion.  Patient's O2 sats have remained in the 90s on 1 L nasal cannula.  She states that her myalgias have improved.  Objective:  Vital signs in last 24 hours: Vitals:   05/01/19 1619 05/01/19 1642 05/01/19 2357 05/02/19 0901  BP:  105/68 104/64 92/61  Pulse:  63 (!) 59 (!) 54  Resp:      Temp:  98.4 F (36.9 C) 97.7 F (36.5 C) 97.6 F (36.4 C)  TempSrc:  Oral Oral Oral  SpO2: 94% 92% 94% 93%  Weight:      Height:       General: Awake, alert, oriented x3, NAD CVS: Regular rhythm, normal heart sounds Lungs: CTA bilaterally, no wheezes or rales noted on auscultation Abdomen: Soft, nontender, nondistended, normoactive bowel sounds Extremities: No edema noted Psych: Normal mood and affect  Assessment/Plan:  Active Problems:   COVID-19  Assessment and Plan: I have seen and evaluated the patient as outlined above. I agree with the formulated Assessment and Plan as detailed in the residents' note, with the following changes:   1.  COVID-19 infection: -Patient presented to the ED with a 2-day history of myalgias and generalized malaise and was found to be Covid positive.  Her chest x-ray was notable for bibasilar infiltrates and she had oxygen desaturations to 85% on room air on presentation.  . -Will attempt to wean O2 as tolerated.  Patient currently with O2 sats in the 90s on 1 L nasal cannula.  Discussed with RN who will ambulate the patient today and monitor her O2 sat with exertion. -We will continue with remdesivir day 3 and Decadron day 3 for now -Patient states that she is slowly improving -Continue with airborne precautions for now -PT signed off.  No needs identified at this time -No further work-up at this time.  2.  COPD: -Patient has a history of COPD for which she takes Dulera and albuterol at home -No wheezing noted on auscultation today -Continue  with Dulera twice daily as well as albuterol as needed  3.  OSA: -CPAP on hold currently secondary to her Covid infection.  No further work-up at this time  4.  Paroxysmal A. Fib: -Patient is not on any medications currently at home for this including no anticoagulation. -EKG here shows normal sinus rhythm and patient has regular rate and rhythm on auscultation. -No further work-up at this time.   5.  Hyperlipidemia: -We will continue with atorvastatin for now  Dispo: Anticipated discharge in approximately 2-3 day(s).   Aldine Contes, MD 05/02/2019, 3:34 PM

## 2019-05-03 LAB — COMPREHENSIVE METABOLIC PANEL
ALT: 17 U/L (ref 0–44)
AST: 22 U/L (ref 15–41)
Albumin: 2.7 g/dL — ABNORMAL LOW (ref 3.5–5.0)
Alkaline Phosphatase: 69 U/L (ref 38–126)
Anion gap: 12 (ref 5–15)
BUN: 16 mg/dL (ref 8–23)
CO2: 23 mmol/L (ref 22–32)
Calcium: 8.8 mg/dL — ABNORMAL LOW (ref 8.9–10.3)
Chloride: 106 mmol/L (ref 98–111)
Creatinine, Ser: 1.02 mg/dL — ABNORMAL HIGH (ref 0.44–1.00)
GFR calc Af Amer: >60 mL/min (ref >60)
GFR calc non Af Amer: 58 mL/min — ABNORMAL LOW (ref >60)
Glucose, Bld: 149 mg/dL — ABNORMAL HIGH (ref 70–99)
Potassium: 3.9 mmol/L (ref 3.5–5.1)
Sodium: 141 mmol/L (ref 135–145)
Total Bilirubin: 0.7 mg/dL (ref 0.3–1.2)
Total Protein: 6.6 g/dL (ref 6.5–8.1)

## 2019-05-03 LAB — CBC WITH DIFFERENTIAL/PLATELET
Abs Immature Granulocytes: 0.06 10*3/uL (ref 0.00–0.07)
Basophils Absolute: 0 10*3/uL (ref 0.0–0.1)
Basophils Relative: 0 %
Eosinophils Absolute: 0 10*3/uL (ref 0.0–0.5)
Eosinophils Relative: 0 %
HCT: 42.7 % (ref 36.0–46.0)
Hemoglobin: 13.7 g/dL (ref 12.0–15.0)
Immature Granulocytes: 1 %
Lymphocytes Relative: 13 %
Lymphs Abs: 1.4 10*3/uL (ref 0.7–4.0)
MCH: 25.1 pg — ABNORMAL LOW (ref 26.0–34.0)
MCHC: 32.1 g/dL (ref 30.0–36.0)
MCV: 78.3 fL — ABNORMAL LOW (ref 80.0–100.0)
Monocytes Absolute: 0.5 10*3/uL (ref 0.1–1.0)
Monocytes Relative: 5 %
Neutro Abs: 8.7 10*3/uL — ABNORMAL HIGH (ref 1.7–7.7)
Neutrophils Relative %: 81 %
Platelets: 217 10*3/uL (ref 150–400)
RBC: 5.45 MIL/uL — ABNORMAL HIGH (ref 3.87–5.11)
RDW: 14.4 % (ref 11.5–15.5)
WBC: 10.6 10*3/uL — ABNORMAL HIGH (ref 4.0–10.5)
nRBC: 0 % (ref 0.0–0.2)

## 2019-05-03 LAB — D-DIMER, QUANTITATIVE: D-Dimer, Quant: 0.39 ug/mL-FEU (ref 0.00–0.50)

## 2019-05-03 LAB — C-REACTIVE PROTEIN: CRP: 2 mg/dL — ABNORMAL HIGH (ref <1.0)

## 2019-05-03 LAB — FERRITIN: Ferritin: 160 ng/mL (ref 11–307)

## 2019-05-03 NOTE — Progress Notes (Signed)
Unable to place pt on cpap due to positive covid test.

## 2019-05-03 NOTE — Progress Notes (Signed)
     Subjective: Patient states that she feels much better today and that her shortness of breath is improving.  Patient was able to ambulate to the restroom and back and states that she felt better than she did yesterday when she attempted this.  Patient's O2 sats remained in the 90s.  Objective:  Vital signs in last 24 hours: Vitals:   05/02/19 0901 05/02/19 1639 05/03/19 0029 05/03/19 0806  BP: 92/61 111/70 115/66 117/66  Pulse: (!) 54 63 (!) 52 (!) 58  Resp:      Temp: 97.6 F (36.4 C) 97.8 F (36.6 C) (!) 97.4 F (36.3 C) (!) 97.3 F (36.3 C)  TempSrc: Oral Oral Oral Oral  SpO2: 93% 97% 95% 93%  Weight:      Height:       General: Awake alert, oriented x3, NAD CVS: Regular rhythm, normal heart sounds Lungs: CTA bilaterally Abdomen: Soft, nontender, nondistended, normoactive bowel sounds Extremities: No edema noted, nontender to palpation Psych: Normal mood and affect  Assessment/Plan:  Active Problems:   COVID-19  1.COVID-19 infection: -Patient presented to the ED with a 2-day history of myalgias and generalized malaise and was found to be Covid positive. Her chest x-ray was notable for bibasilar infiltrates and she had oxygen desaturations to 85% on room air on presentation. . -Will attempt to wean O2 as tolerated.  Patient currently with O2 sats in the 90s on supplemental oxygen.  Will attempt to ambulate patient today and see how she does. -We will continue with remdesivir day 4 and Decadron day  4 for now -Patient states that she feels much better on current regimen.  Inflammatory markers appear to be improving.  She does have a mild leukocytosis today.  We will continue to monitor. -Continue with airborne precautions for now -PT signed off.  No needs identified at this time -No further work-up at this time.  2.COPD: -Patient has a history of COPD for which she takes Dulera and albuterol at home -No wheezing noted on auscultation today -Continue with  Dulera twice daily as well as albuterol as needed  3.OSA: -CPAP on hold currently secondary to her Covid infection. No further work-up at this time  4.Paroxysmal A. Fib: -Patient is not on any medications currently at home for this including no anticoagulation. -EKG here shows normal sinus rhythm and patient has regular rate and rhythm on auscultation. -No further work-up at this time.   5.Hyperlipidemia: -We will continue with atorvastatin for now  Dispo: Anticipated discharge in approximately 1-2 day(s).   Aldine Contes, MD 05/03/2019, 4:13 PM Pager: @[phone] @

## 2019-05-03 NOTE — Plan of Care (Signed)
  Problem: Education: Goal: Knowledge of General Education information will improve Description: Including pain rating scale, medication(s)/side effects and non-pharmacologic comfort measures Outcome: Progressing   Problem: Clinical Measurements: Goal: Ability to maintain clinical measurements within normal limits will improve Outcome: Progressing Goal: Respiratory complications will improve Outcome: Progressing Goal: Cardiovascular complication will be avoided Outcome: Progressing   Problem: Nutrition: Goal: Adequate nutrition will be maintained Outcome: Progressing   Problem: Respiratory: Goal: Will maintain a patent airway Outcome: Progressing

## 2019-05-04 LAB — COMPREHENSIVE METABOLIC PANEL
ALT: 17 U/L (ref 0–44)
AST: 19 U/L (ref 15–41)
Albumin: 2.7 g/dL — ABNORMAL LOW (ref 3.5–5.0)
Alkaline Phosphatase: 70 U/L (ref 38–126)
Anion gap: 8 (ref 5–15)
BUN: 14 mg/dL (ref 8–23)
CO2: 25 mmol/L (ref 22–32)
Calcium: 9.1 mg/dL (ref 8.9–10.3)
Chloride: 107 mmol/L (ref 98–111)
Creatinine, Ser: 0.98 mg/dL (ref 0.44–1.00)
GFR calc Af Amer: >60 mL/min (ref >60)
GFR calc non Af Amer: >60 mL/min (ref >60)
Glucose, Bld: 119 mg/dL — ABNORMAL HIGH (ref 70–99)
Potassium: 4.4 mmol/L (ref 3.5–5.1)
Sodium: 140 mmol/L (ref 135–145)
Total Bilirubin: 0.5 mg/dL (ref 0.3–1.2)
Total Protein: 6.7 g/dL (ref 6.5–8.1)

## 2019-05-04 LAB — CBC WITH DIFFERENTIAL/PLATELET
Abs Immature Granulocytes: 0.06 10*3/uL (ref 0.00–0.07)
Basophils Absolute: 0 10*3/uL (ref 0.0–0.1)
Basophils Relative: 0 %
Eosinophils Absolute: 0 10*3/uL (ref 0.0–0.5)
Eosinophils Relative: 0 %
HCT: 43.4 % (ref 36.0–46.0)
Hemoglobin: 13.8 g/dL (ref 12.0–15.0)
Immature Granulocytes: 1 %
Lymphocytes Relative: 15 %
Lymphs Abs: 1.7 10*3/uL (ref 0.7–4.0)
MCH: 24.8 pg — ABNORMAL LOW (ref 26.0–34.0)
MCHC: 31.8 g/dL (ref 30.0–36.0)
MCV: 78.1 fL — ABNORMAL LOW (ref 80.0–100.0)
Monocytes Absolute: 0.7 10*3/uL (ref 0.1–1.0)
Monocytes Relative: 7 %
Neutro Abs: 8.4 10*3/uL — ABNORMAL HIGH (ref 1.7–7.7)
Neutrophils Relative %: 77 %
Platelets: 222 10*3/uL (ref 150–400)
RBC: 5.56 MIL/uL — ABNORMAL HIGH (ref 3.87–5.11)
RDW: 14.2 % (ref 11.5–15.5)
WBC: 10.9 10*3/uL — ABNORMAL HIGH (ref 4.0–10.5)
nRBC: 0 % (ref 0.0–0.2)

## 2019-05-04 LAB — D-DIMER, QUANTITATIVE: D-Dimer, Quant: <0.27 ug/mL-FEU (ref 0.00–0.50)

## 2019-05-04 LAB — C-REACTIVE PROTEIN: CRP: 1 mg/dL — ABNORMAL HIGH (ref <1.0)

## 2019-05-04 LAB — FERRITIN: Ferritin: 142 ng/mL (ref 11–307)

## 2019-05-04 NOTE — Progress Notes (Signed)
   Subjective: No acute events overnight. Maria Nelson is feeling well this morning. Her breathing has significantly improved since admission. No chest pain, fevers, chills, n/v. Discussed plan to continue working on getting her off of oxygen and to see how she does ambulating with nursing staff.   Objective:  Vital signs in last 24 hours: Vitals:   05/03/19 1649 05/03/19 2209 05/04/19 0643 05/04/19 0830  BP: 103/68 111/75 99/63 (!) 99/53  Pulse: (!) 58 (!) 55 (!) 52 60  Resp:   16   Temp: 98.2 F (36.8 C) 98.2 F (36.8 C) 98.2 F (36.8 C) 98.3 F (36.8 C)  TempSrc: Oral Oral Oral Oral  SpO2: 93% 95% 95% 90%  Weight:      Height:       General: awake, alert, pleasant female sitting up in bed in NAD CV: RRR Pulm: normal work of breathing; lungs CTAB Ext: no edema   Assessment/Plan:  Active Problems:   COVID-19  1.COVID-19 infection: -clinically improving - remains on 2L [place]; continue to wean oxygen as tolerated - today will complete 5 day course of remdesivir; day 5/10 of decadron - plan to monitor O2 sats while ambulating with nursing   2.COPD: -Patient has a history of COPD for which she takes Dulera and albuterol at home -No wheezing noted on auscultation today -Continue with Dulera twice daily as well as albuterol as needed  3.OSA: -CPAP on hold currently secondary to her Covid infection.  4.Paroxysmal A. Fib: -Patient is not on any medications currently at home for this including no anticoagulation. -EKG here shows normal sinus rhythm and patient has regular rate and rhythm on auscultation.   Dispo: Anticipated discharge in approximately 1-2 day(s) if she is able to wean to room air and maintain O2 sats with ambulation.   Modena Nunnery D, DO 05/04/2019, 11:32 AM Pager: [phone]

## 2019-05-05 LAB — CBC WITH DIFFERENTIAL/PLATELET
Abs Immature Granulocytes: 0.13 10*3/uL — ABNORMAL HIGH (ref 0.00–0.07)
Basophils Absolute: 0 10*3/uL (ref 0.0–0.1)
Basophils Relative: 0 %
Eosinophils Absolute: 0 10*3/uL (ref 0.0–0.5)
Eosinophils Relative: 0 %
HCT: 44.5 % (ref 36.0–46.0)
Hemoglobin: 14.3 g/dL (ref 12.0–15.0)
Immature Granulocytes: 1 %
Lymphocytes Relative: 17 %
Lymphs Abs: 2.2 10*3/uL (ref 0.7–4.0)
MCH: 24.7 pg — ABNORMAL LOW (ref 26.0–34.0)
MCHC: 32.1 g/dL (ref 30.0–36.0)
MCV: 77 fL — ABNORMAL LOW (ref 80.0–100.0)
Monocytes Absolute: 0.9 10*3/uL (ref 0.1–1.0)
Monocytes Relative: 7 %
Neutro Abs: 9.9 10*3/uL — ABNORMAL HIGH (ref 1.7–7.7)
Neutrophils Relative %: 75 %
Platelets: 267 10*3/uL (ref 150–400)
RBC: 5.78 MIL/uL — ABNORMAL HIGH (ref 3.87–5.11)
RDW: 14.1 % (ref 11.5–15.5)
WBC: 13.2 10*3/uL — ABNORMAL HIGH (ref 4.0–10.5)
nRBC: 0 % (ref 0.0–0.2)

## 2019-05-05 LAB — C-REACTIVE PROTEIN: CRP: 0.8 mg/dL (ref <1.0)

## 2019-05-05 LAB — CULTURE, BLOOD (ROUTINE X 2)
Culture: NO GROWTH
Culture: NO GROWTH
Special Requests: ADEQUATE
Special Requests: ADEQUATE

## 2019-05-05 LAB — COMPREHENSIVE METABOLIC PANEL
ALT: 17 U/L (ref 0–44)
AST: 17 U/L (ref 15–41)
Albumin: 2.8 g/dL — ABNORMAL LOW (ref 3.5–5.0)
Alkaline Phosphatase: 74 U/L (ref 38–126)
Anion gap: 9 (ref 5–15)
BUN: 16 mg/dL (ref 8–23)
CO2: 27 mmol/L (ref 22–32)
Calcium: 9.4 mg/dL (ref 8.9–10.3)
Chloride: 105 mmol/L (ref 98–111)
Creatinine, Ser: 1.01 mg/dL — ABNORMAL HIGH (ref 0.44–1.00)
GFR calc Af Amer: >60 mL/min (ref >60)
GFR calc non Af Amer: 59 mL/min — ABNORMAL LOW (ref >60)
Glucose, Bld: 106 mg/dL — ABNORMAL HIGH (ref 70–99)
Potassium: 4.3 mmol/L (ref 3.5–5.1)
Sodium: 141 mmol/L (ref 135–145)
Total Bilirubin: 0.5 mg/dL (ref 0.3–1.2)
Total Protein: 7.2 g/dL (ref 6.5–8.1)

## 2019-05-05 LAB — D-DIMER, QUANTITATIVE: D-Dimer, Quant: 0.29 ug/mL-FEU (ref 0.00–0.50)

## 2019-05-05 LAB — FERRITIN: Ferritin: 129 ng/mL (ref 11–307)

## 2019-05-05 MED ORDER — DIPHENHYDRAMINE HCL 25 MG PO CAPS
25.0000 mg | ORAL_CAPSULE | Freq: Once | ORAL | Status: AC
  Administered 2019-05-05: 25 mg via ORAL
  Filled 2019-05-05: qty 1

## 2019-05-05 NOTE — Progress Notes (Signed)
   Subjective: Maria Nelson was seen and evaluated at bedside this morning. She continues to feel better each day. She was able to walk with nursing staff yesterday with some mild dyspnea on exertion. No chest pain, n/v/d.   Objective:  Vital signs in last 24 hours: Vitals:   05/04/19 0830 05/04/19 1618 05/04/19 2115 05/05/19 0932  BP: (!) 99/53 111/73 104/72 106/71  Pulse: 60 (!) 54 (!) 55 (!) 53  Resp:    20  Temp: 98.3 F (36.8 C) 97.7 F (36.5 C) 97.8 F (36.6 C) (!) 97.4 F (36.3 C)  TempSrc: Oral Oral Oral Oral  SpO2: 90% 94% 96% 97%  Weight:      Height:       General: awake, alert, pleasant female sitting up in bed in NAD CV: RRR Pulm: normal work of breathing; lungs CTAB Ext: 1+ pitting edema BLE   Assessment/Plan:  Active Problems:   COVID-19  1.COVID-19 infection: -clinically improving - remains on 2L [place]; continue to wean oxygen as tolerated - completed 5 day course of remdesivir; this is day 6/10 of decadron - desaturated to 80% on room air when ambulating with nursing staff yesterday which came up to normal on 2L - will plan to discharge her tomorrow with home oxygen   2.COPD: -Patient has a history of COPD for which she takes Dulera and albuterol at home -No wheezing noted on auscultation today -Continue with Dulera twice daily as well as albuterol as needed  3.OSA: -CPAP on hold currently secondary to her Covid infection.  4.Paroxysmal A. Fib: -Patient is not on any medications currently at home for this including no anticoagulation. -EKG here shows normal sinus rhythm and patient has regular rate and rhythm on auscultation.  Dispo: Anticipated discharge in approximately 1 day(s).   Modena Nunnery D, DO 05/05/2019, 11:29 AM Pager: [phone]

## 2019-05-05 NOTE — Plan of Care (Signed)

## 2019-05-06 MED ORDER — DEXAMETHASONE 6 MG PO TABS: 6.0000 mg | ORAL_TABLET | Freq: Every day | ORAL | 0 refills | Status: DC

## 2019-05-06 NOTE — Progress Notes (Signed)
SATURATION QUALIFICATIONS: (This note is used to comply with regulatory documentation for home oxygen)  Patient Saturations on Room Air at Rest = 96%  Patient Saturations on Room Air while Ambulating = 91%  Patient Saturations on 2 Liters of oxygen while Ambulating = 95%  Please briefly explain why patient needs home oxygen:Patient passed walking test based on saturation qualifications.

## 2019-05-06 NOTE — Care Management (Signed)
Patient ambulated on RA satting 91%, she did not require oxygen. She will not qualify for home DME oxygen.

## 2019-05-06 NOTE — Discharge Summary (Signed)
Name: Maria Nelson MRN: [id number] DOB: 12/26/1955 64 y.o. PCP: Madalyn Rob, MD  Date of Admission: 04/30/2019 12:44 PM Date of Discharge: 05/06/2019 Attending Physician: Bartholomew Crews, MD  Discharge Diagnosis: 1. Acute hypoxic respiratory failure 2/2 COVID-19 pneumonia    Discharge Medications: Allergies as of 05/06/2019      Reactions   Bee Venom Anaphylaxis   Aspirin Nausea Only   Other Rash   EKG pads cause burning sensation as soon as applied      Medication List    TAKE these medications   albuterol 108 (90 Base) MCG/ACT inhaler Commonly known as: VENTOLIN HFA Inhale 2 puffs into the lungs every 6 (six) hours as needed for wheezing.   albuterol (2.5 MG/3ML) 0.083% nebulizer solution Commonly known as: PROVENTIL Use 1 vial in nebulizer up to 3 times a day when albuterol inhaler not working. If no better or getting worse, call 911 or physician line   atorvastatin 40 MG tablet Commonly known as: LIPITOR Take 1 tablet (40 mg total) by mouth daily.   cetirizine 10 MG tablet Commonly known as: ZYRTEC Take 1 tablet by mouth once daily   Dulera 200-5 MCG/ACT Aero Generic drug: mometasone-formoterol INHALE 2 PUFFS BY MOUTH TWICE DAILY TO PREVNT COUGH OR WHEEZE. RISE GARGLE AND SPIT AFTER. What changed:   how much to take  how to take this  when to take this  additional instructions   EPINEPHrine 0.3 mg/0.3 mL Soaj injection Commonly known as: EpiPen 2-Pak Inject 0.3 mLs (0.3 mg total) into the muscle as needed (for allergic reaction).   metoprolol tartrate 100 MG tablet Commonly known as: LOPRESSOR Take 1 tablet, 100 mg, by mouth, 2 hours before your Cardiac CT.   pantoprazole 40 MG tablet Commonly known as: PROTONIX Take 1 tablet (40 mg total) by mouth daily.       Disposition and follow-up:   Maria Nelson was discharged from [facility] in Good condition.  At the hospital follow up visit  please address:  1.  COVID-19: ensure Maria Nelson has continued to do well at home in terms of respiratory symptoms. Check O2 sats. Recommended that she wear home O2 for the next few days as she continues to recover.   2.  Labs / imaging needed at time of follow-up: none  3.  Pending labs/ test needing follow-up: none  Follow-up Appointments:   Hospital Course by problem list: Patient is a 64 year old female with past medical history of COPD, GERD, hypertension who presents with an approximate 3-day history of body aches.   COVID-19 Pneumonia: Patient presented with hypoxia to 85% on room air, requiring 2 L. She was initiated on remdesivir and dexamethasone. She made clinical improvement each day. She completed 5 day course of Remdesivir. By day of discharge, she was able to ambulate without dyspnea and maintain O2 sats on little to no oxygen. Patient already had oxygen set up at home to use as needed so she did not require any additional at discharge. She was discharged home in stable condition with follow-up in [facility] scheduled within a week.   Discharge Vitals:   BP 110/76 (BP Location: Right Arm)   Pulse (!) 55   Temp 97.8 F (36.6 C) (Oral)   Resp 20   Ht 5' (1.524 m)   Wt 117.9 kg   LMP 04/19/2004 (Approximate)   SpO2 97%   BMI 50.78 kg/m   Pertinent Labs, Studies, and Procedures:  Portable chest x-ray  Ill-defined bilateral airspace opacities within mid to basilar predominance, likely reflecting pneumonia given the provided history. Radiographic follow-up to resolution recommended.   Discharge Instructions: Discharge Instructions    Call MD for:  difficulty breathing, headache or visual disturbances   Complete by: As directed    Call MD for:  extreme fatigue   Complete by: As directed    Call MD for:  persistant dizziness or light-headedness   Complete by: As directed    Call MD for:  temperature >100.4   Complete by: As directed    Diet - low sodium heart healthy    Complete by: As directed    Discharge instructions   Complete by: As directed    Maria Nelson, It was a pleasure taking care of you. Glad you have improved from your COVID-19 infection. Your oxygen levels did go down when you walked without oxygen, so please continue wearing your oxygen at home while you continue to recover from [place]. Please continue taking the steroids for 4 more days.  I will let our clinic know to give you a call for a hospital follow-up appointment.   Take care!   Increase activity slowly   Complete by: As directed       Signed: Delice Bison, DO 05/12/2019, 6:11 PM   Pager: [phone]

## 2019-05-06 NOTE — Progress Notes (Signed)
   Subjective: No acute events overnight. Maria Nelson feels well this morning. No shortness of breath or chest pain. No light headedness or dyspnea with exertion. Endorses good appetite. She is stable for discharge home. She already has oxygen set up at her house and was instructed to continue using this until she follows up with clinic to re-evaluate oxygen requirements.   Objective:  Vital signs in last 24 hours: Vitals:   05/05/19 0932 05/05/19 1657 05/05/19 2100 05/06/19 0849  BP: 106/71 99/66 97/68  110/76  Pulse: (!) 53 (!) 51 (!) 54 (!) 55  Resp: 20 18  20   Temp: (!) 97.4 F (36.3 C) 97.6 F (36.4 C) 98.1 F (36.7 C) 97.8 F (36.6 C)  TempSrc: Oral Oral Oral Oral  SpO2: 97% 96% 94% 97%  Weight:      Height:       General: awake, alert, pleasant female sitting up in bed in NAD CV: RRR Pulm: Normal work of breathing; lungs CTAB   Assessment/Plan:  Active Problems:   COVID-19  1.COVID-19 infection: -patient has clinically improved and feels back at her baseline -she initially desaturated with ambulation while on room air on 1/13, but repeat eval today shows she is able to maintain O2 sats with ambulation on room air  - patient has oxygen set up at home and can use as needed - she has completed 5 day course of remdesivir; will continue 3 more days of decadron to complete 10 day course - stable for discharge home; will arrange for follow-up in [facility] next week  - diagnosed on 1/11 so will need to quarantine for 4 more days once home   2.COPD: -Continue home dulera and albuterol PRN  3.OSA: -continue CPAP qhs  4.Paroxysmal A. Fib: -Patient is not on any medications currently at home for this including no anticoagulation. -EKG here shows normal sinus rhythm and patient has regular rate and rhythm on auscultation  Dispo: Medically stable for discharge home today.   Modena Nunnery D, DO 05/06/2019, 11:17 AM Pager: [phone]

## 2019-05-10 ENCOUNTER — Telehealth: Payer: Self-pay

## 2019-05-10 MED ORDER — DEXAMETHASONE 1 MG PO TABS: 6.0000 mg | ORAL_TABLET | Freq: Every day | ORAL | 0 refills | Status: AC

## 2019-05-10 NOTE — Telephone Encounter (Signed)
Received Fax from [place] regarding Dexamethasone 6mg  RX which states:  "Please change RX to 1 mg.Marland KitchenMarland KitchenDexamethasone 6mg  not available"  Pt was discharged from [facility] on 1/17, RX written by Dr. Koleen Distance. Thank you, SChaplin, RN,BSN

## 2019-05-10 NOTE — Progress Notes (Deleted)
   [place] [place] [address] Dept: [phone]  FOLLOW UP NOTE  Patient ID: Maria Nelson, female    DOB: May 22, 1955  Age: 64 y.o. MRN: [id number] Date of Office Visit: 05/11/2019  Assessment  Chief Complaint: No chief complaint on file.  HPI [place]    Drug Allergies:  Allergies  Allergen Reactions  . Bee Venom Anaphylaxis  . Aspirin Nausea Only  . Other Rash    EKG pads cause burning sensation as soon as applied    Physical Exam: LMP 04/19/2004 (Approximate)    Physical Exam  Diagnostics:    Assessment and Plan: No diagnosis found.  No orders of the defined types were placed in this encounter.   There are no Patient Instructions on file for this visit.  No follow-ups on file.    Thank you for the opportunity to care for this patient.  Please do not hesitate to contact me with questions.  Gareth Morgan, FNP [facility] [place] [facility] [place]

## 2019-05-11 ENCOUNTER — Ambulatory Visit (INDEPENDENT_AMBULATORY_CARE_PROVIDER_SITE_OTHER): Payer: Medicaid Other | Admitting: Family Medicine

## 2019-05-11 ENCOUNTER — Other Ambulatory Visit: Payer: Self-pay

## 2019-05-11 ENCOUNTER — Encounter: Payer: Self-pay | Admitting: Family Medicine

## 2019-05-11 DIAGNOSIS — L501 Idiopathic urticaria: Secondary | ICD-10-CM

## 2019-05-11 DIAGNOSIS — J449 Chronic obstructive pulmonary disease, unspecified: Secondary | ICD-10-CM | POA: Diagnosis not present

## 2019-05-11 DIAGNOSIS — J3089 Other allergic rhinitis: Secondary | ICD-10-CM

## 2019-05-11 DIAGNOSIS — Z8616 Personal history of COVID-19: Secondary | ICD-10-CM | POA: Diagnosis not present

## 2019-05-11 NOTE — Progress Notes (Signed)
RE: Nayima Hollimon MRN: [id number] DOB: [birth year] Date of Telemedicine Visit: 05/11/2019  Referring provider: Madalyn Rob, MD Primary care provider: Madalyn Rob, MD  Chief Complaint: Asthma and COPD   Telemedicine Follow Up Visit via Telephone: I connected with Juventina Dobry for a follow up on 05/11/19 by telephone and verified that I am speaking with the correct person using two identifiers.   I discussed the limitations, risks, security and privacy concerns of performing an evaluation and management service by telephone and the availability of in person appointments. I also discussed with the patient that there may be a patient responsible charge related to this service. The patient expressed understanding and agreed to proceed.  Patient is at home  Provider is at the office.  Visit start time: 10:56 Visit end time: 11:30 Insurance consent/check in by: Anderson [place] Medical consent and medical assistant/nurse: [place]  History of Present Illness: She is a 64 y.o. female, who is being followed for asthma, chronic urticaria, and allergic rhinitis. Her previous allergy office visit was on 02/27/2018 with Dr. Verlin Fester. In the interim, she has been hospitalized for symptoms related to a positive COVID diagnosis from 05/01/2019 to 05/06/2019. She reports she has not regained her taste or smell at this time. Asthma is reported as well controlled with no shortness of breath, cough, or wheeze with activity or rest. She continues Dulera 200-2 puffs twice a day with a spacer and uses her albuterol about once a week with relief of symptoms. Allergic rhinitis is reported as moderately well controlled with cetirizine 10 mg once a day with relief of symptoms. She reports that her nares are frequently this time of the year as well as after being in the hospital with oxygen via nasal canula. She no loner has a requirement for oxygen, however, her nares remain dry and she has been applying  Vaseline with partial relief. She denies hives or angioedema. She denies heartburn and continues with pantoprazole 40 mg once a day. Her current medications are listed in the chart.   Assessment and Plan: Trany is a 64 y.o. female with: Patient Instructions  Asthma/COPD Continue Dulera 200-2 puffs twice a day with a spacer to prevent cough or wheeze Continue albuterol 2 puffs every 4 hours as needed for cough or wheeze You may use albuterol 2 puffs 5-15 minutes before activity  Chronic urticaria Continuine cetirizine 10 mg once a day as needed for hives. You may take an additional cetirizine 10 mg for breakthrough hives or itching.   Allergic rhinitis Continue azelastine 2 sprays twice a day as needed for a runny nose Continue cetirizine 10 mg once a day as needed for a runny nose as listed above Consider saline nasal rinses as needed for nasal symptoms. Use this before any medicated nasal sprays for best result Begin nasal saline gel as needed for dry nostrils. Some examples include NeilMed and [employer] brands.   Reflux Continue Pantoprazole 40 mg once a day as previously prescribed.  Continue dietary and lifestyle modifications as listed below  Call the clinic if this treatment plan is not working well for you  Follow up in 3 months or sooner if needed.    Return in about 3 months (around 08/09/2019), or if symptoms worsen or fail to improve.   Medication List:  Current Outpatient Medications  Medication Sig Dispense Refill  . albuterol (PROVENTIL HFA;VENTOLIN HFA) 108 (90 BASE) MCG/ACT inhaler Inhale 2 puffs into the lungs every 6 (six) hours as needed for wheezing. 3.7  g 3  . albuterol (PROVENTIL) (2.5 MG/3ML) 0.083% nebulizer solution Use 1 vial in nebulizer up to 3 times a day when albuterol inhaler not working. If no better or getting worse, call 911 or physician line 75 mL 3  . cetirizine (ZYRTEC) 10 MG tablet Take 1 tablet by mouth once daily 30 tablet 6  . EPINEPHrine  (EPIPEN 2-PAK) 0.3 mg/0.3 mL IJ SOAJ injection Inject 0.3 mLs (0.3 mg total) into the muscle as needed (for allergic reaction). 1 each 0  . mometasone-formoterol (DULERA) 200-5 MCG/ACT AERO INHALE 2 PUFFS BY MOUTH TWICE DAILY TO PREVNT COUGH OR WHEEZE. RISE GARGLE AND SPIT AFTER. (Patient taking differently: Inhale 2 puffs into the lungs 2 (two) times daily. ) 13 g 0  . pantoprazole (PROTONIX) 40 MG tablet Take 1 tablet (40 mg total) by mouth daily. 90 tablet 3  . atorvastatin (LIPITOR) 40 MG tablet Take 1 tablet (40 mg total) by mouth daily. (Patient not taking: Reported on 05/11/2019) 90 tablet 3  . dexamethasone (DECADRON) 1 MG tablet Take 6 tablets (6 mg total) by mouth daily for 4 days. (Patient not taking: Reported on 05/11/2019) 24 tablet 0  . metoprolol tartrate (LOPRESSOR) 100 MG tablet Take 1 tablet, 100 mg, by mouth, 2 hours before your Cardiac CT. 1 tablet 0   No current facility-administered medications for this visit.   Allergies: Allergies  Allergen Reactions  . Bee Venom Anaphylaxis  . Aspirin Nausea Only  . Other Rash    EKG pads cause burning sensation as soon as applied   I reviewed her past medical history, social history, family history, and environmental history and no significant changes have been reported from previous visit on 02/27/2018.  Objective: Physical Exam Not obtained as encounter was done via telephone.   Previous notes and tests were reviewed.  I discussed the assessment and treatment plan with the patient. The patient was provided an opportunity to ask questions and all were answered. The patient agreed with the plan and demonstrated an understanding of the instructions.   The patient was advised to call back or seek an in-person evaluation if the symptoms worsen or if the condition fails to improve as anticipated.  I provided 34 minutes of non-face-to-face time during this encounter.  It was my pleasure to participate in [place] care  today. Please feel free to contact me with any questions or concerns.   Sincerely,  Gareth Morgan, FNP

## 2019-05-11 NOTE — Patient Instructions (Addendum)
Asthma/COPD Continue Dulera 200-2 puffs twice a day with a spacer to prevent cough or wheeze Continue albuterol 2 puffs every 4 hours as needed for cough or wheeze You may use albuterol 2 puffs 5-15 minutes before activity  Chronic urticaria Continuine cetirizine 10 mg once a day as needed for hives. You may take an additional cetirizine 10 mg for breakthrough hives or itching.   Allergic rhinitis Continue azelastine 2 sprays twice a day as needed for a runny nose Continue cetirizine 10 mg once a day as needed for a runny nose as listed above Consider saline nasal rinses as needed for nasal symptoms. Use this before any medicated nasal sprays for best result Begin nasal saline gel as needed for dry nostrils. Some examples include NeilMed and Ayr brands.   Reflux Continue Pantoprazole 40 mg once a day as previously prescribed.  Continue dietary and lifestyle modifications as listed below  Call the clinic if this treatment plan is not working well for you  Follow up in 3 months or sooner if needed.   Lifestyle Changes for Controlling GERD When you have GERD, stomach acid feels as if it's backing up toward your mouth. Whether or not you take medication to control your GERD, your symptoms can often be improved with lifestyle changes.   Raise Your Head  Reflux is more likely to strike when you're lying down flat, because stomach fluid can  flow backward more easily. Raising the head of your bed 4-6 inches can help. To do this:  Slide blocks or books under the legs at the head of your bed. Or, place a wedge under  the mattress. Many foam stores can make a suitable wedge for you. The wedge  should run from your waist to the top of your head.  Don't just prop your head on several pillows. This increases pressure on your  stomach. It can make GERD worse.  Watch Your Eating Habits Certain foods may increase the acid in your stomach or relax the lower esophageal sphincter, making  GERD more likely. It's best to avoid the following:  Coffee, tea, and carbonated drinks (with and without caffeine)  Fatty, fried, or spicy food  Mint, chocolate, onions, and tomatoes  Any other foods that seem to irritate your stomach or cause you pain  Relieve the Pressure  Eat smaller meals, even if you have to eat more often.  Don't lie down right after you eat. Wait a few hours for your stomach to empty.  Avoid tight belts and tight-fitting clothes.  Lose excess weight.  Tobacco and Alcohol  Avoid smoking tobacco and drinking alcohol. They can make GERD symptoms worse.

## 2019-05-17 ENCOUNTER — Ambulatory Visit (INDEPENDENT_AMBULATORY_CARE_PROVIDER_SITE_OTHER): Payer: Medicaid Other | Admitting: Internal Medicine

## 2019-05-17 ENCOUNTER — Other Ambulatory Visit: Payer: Self-pay

## 2019-05-17 DIAGNOSIS — Z9981 Dependence on supplemental oxygen: Secondary | ICD-10-CM

## 2019-05-17 DIAGNOSIS — Z7951 Long term (current) use of inhaled steroids: Secondary | ICD-10-CM

## 2019-05-17 DIAGNOSIS — U071 COVID-19: Secondary | ICD-10-CM

## 2019-05-17 DIAGNOSIS — J449 Chronic obstructive pulmonary disease, unspecified: Secondary | ICD-10-CM

## 2019-05-17 DIAGNOSIS — G4733 Obstructive sleep apnea (adult) (pediatric): Secondary | ICD-10-CM | POA: Diagnosis not present

## 2019-05-17 MED ORDER — DULERA 200-5 MCG/ACT IN AERO: 2.0000 | INHALATION_SPRAY | Freq: Two times a day (BID) | RESPIRATORY_TRACT | 5 refills | Status: DC

## 2019-05-17 NOTE — Assessment & Plan Note (Signed)
Has been using CPAP intermittently. She prefers to use it while she is awake in the evening. Explained OSA and that it specifically helps her while she is asleep. She endorsed understanding and will try to use it more at night.

## 2019-05-17 NOTE — Progress Notes (Signed)
   CC: covid 19 follow-up  This is a telephone encounter between [employer] and [name] on 05/17/2019 for covid-19 infection follow-up. The visit was conducted with the patient located at home and [name] at [facility]. The patient's identity was confirmed using their DOB and current address. The patient has consented to being evaluated through a telephone encounter and understands the associated risks (an examination cannot be done and the patient may need to come in for an appointment) / benefits (allows the patient to remain at home, decreasing exposure to coronavirus). I personally spent 16 minutes on medical discussion.   HPI:  Maria Nelson is a 64 y.o. with PMH as below.   Please see A&P for assessment of the patient's acute and chronic medical conditions.   She was admitted to the hospital 04/30/19 - 05/06/19 for covid-19 infection and required supplemental O2 during hospital stay, which she uses prn prior to her hospitalization for asthma and COPD. Since discharge her cough and SOB have resolved. She denies chest pain, nausea, fatigue, or swelling of her LE. Her sense of taste is slowly returning. She has been exercising and says she is doing very well. She has been trying to use her CPAP but prefers to use it while awake. Sometimes she will take it off at night because she can't sleep.    Past Medical History:  Diagnosis Date  . 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 Asthma   . Carpal tunnel syndrome 06/23/2011   Patient notes history of bilateral carpal tunnel syndrome.  Now has symptoms of right hand carpal tunnel.   [name] COPD (chronic obstructive pulmonary disease) ([place])    secondary to tobacco use // No PFTs on file  . COVID-19   . ETOH abuse    Pt stopped in 3/08  . Gastrointestinal hemorrhage    secondary to PUD on March 2008  . OSA on CPAP    noncompliant with CPAP (2/2 it being depressing)  . Peptic ulcer disease    +H. pylori (antigen)- Dr.  Olevia Perches- treated  . STRESS INCONTINENCE 01/24/2007  . Tobacco abuse    quit in 2010  . Urinary incontinence   . Urticaria    Review of Systems:   Ros negative except as noted in HPI    Assessment & Plan:   See Encounters Tab for problem based charting.  Patient discussed with Dr. Philipp Ovens

## 2019-05-17 NOTE — Assessment & Plan Note (Signed)
She was admitted to the hospital 04/30/19 - 05/06/19 for covid-19 infection and required supplemental O2 during hospital stay, which she uses prn prior to her hospitalization for asthma and COPD. Since discharge her cough and SOB have resolved. She denies chest pain, nausea, fatigue, or swelling of her LE. Her sense of taste is slowly returning. She has been exercising and says she is doing very well. She has been trying to use her CPAP but prefers to use it while awake. Sometimes she will take it off at night because she can't sleep.   - discussed importance of using CPAP during sleep as she has a diagnosis of OSA  - refilled dulera - covid-19 infection resolved, f/u as planned with her PCP in May.

## 2019-05-28 NOTE — Progress Notes (Signed)
Internal Medicine Clinic Attending  Case discussed with Dr. Seawell at the time of the visit.  We reviewed the resident's history and exam and pertinent patient test results.  I agree with the assessment, diagnosis, and plan of care documented in the resident's note.    

## 2019-08-10 ENCOUNTER — Other Ambulatory Visit: Payer: Self-pay

## 2019-08-10 ENCOUNTER — Ambulatory Visit (INDEPENDENT_AMBULATORY_CARE_PROVIDER_SITE_OTHER): Payer: Medicaid Other | Admitting: Family Medicine

## 2019-08-10 ENCOUNTER — Encounter: Payer: Self-pay | Admitting: Family Medicine

## 2019-08-10 VITALS — BP 110/80 | HR 65 | Temp 98.0°F | Resp 20 | Ht 59.5 in | Wt 259.4 lb

## 2019-08-10 DIAGNOSIS — J3089 Other allergic rhinitis: Secondary | ICD-10-CM | POA: Diagnosis not present

## 2019-08-10 DIAGNOSIS — Z8616 Personal history of COVID-19: Secondary | ICD-10-CM | POA: Diagnosis not present

## 2019-08-10 DIAGNOSIS — L501 Idiopathic urticaria: Secondary | ICD-10-CM | POA: Diagnosis not present

## 2019-08-10 DIAGNOSIS — T783XXD Angioneurotic edema, subsequent encounter: Secondary | ICD-10-CM

## 2019-08-10 DIAGNOSIS — J449 Chronic obstructive pulmonary disease, unspecified: Secondary | ICD-10-CM | POA: Diagnosis not present

## 2019-08-10 MED ORDER — BREZTRI AEROSPHERE 160-9-4.8 MCG/ACT IN AERO: 2.0000 | INHALATION_SPRAY | Freq: Two times a day (BID) | RESPIRATORY_TRACT | 5 refills | Status: DC

## 2019-08-10 NOTE — Progress Notes (Signed)
[place] [address] Dept: [phone]  FOLLOW UP NOTE  Patient ID: Maria Nelson, female    DOB: 31-Jan-1956  Age: 64 y.o. MRN: [id number] Date of Office Visit: 08/10/2019  Assessment  Chief Complaint: Follow-up and Asthma  HPI Maria Nelson is a 64 year old female who presents to the clinic for a follow up visit. She was last seen in this clinic on 05/11/2019 for evaluation of asthma COPD overlap, chronic urticaria, allergic rhinitis, and reflux.  At today's visit she reports that her asthma has been poorly controlled with shortness of breath occurring with rest and activity for the last 3 months.  She denies wheezing and cough with rest and activity.  She continues Dulera 200 -2 puffs twice a day with a spacer and uses albuterol about 2 times a week which provides relief of symptoms for short period of time.  She reports she is avoiding activities because she is increasingly short of breath with activity.  Allergic rhinitis is reported as well controlled with cetirizine and saline gel.  Chronic urticaria is reported as well controlled with cetirizine once a day.  Reflux is reported as well controlled with no heartburn while continuing pantoprazole 40 mg once a day.  Her current medications are listed in the chart.   Drug Allergies:  Allergies  Allergen Reactions  . Bee Venom Anaphylaxis  . Aspirin Nausea Only  . Other Rash    EKG pads cause burning sensation as soon as applied    Physical Exam: BP 110/80 (BP Location: Left Arm, Patient Position: Sitting, Cuff Size: Large)   Pulse 65   Temp 98 F (36.7 C) (Temporal)   Resp 20   Ht 4' 11.5" (1.511 m)   Wt 259 lb 6.4 oz (117.7 kg)   LMP 04/19/2004 (Approximate)   SpO2 91%   BMI 51.52 kg/m    Physical Exam Vitals reviewed.  Constitutional:      Appearance: Normal appearance.  HENT:     Head: Normocephalic and atraumatic.     Right Ear: Tympanic membrane normal.     Left  Ear: Tympanic membrane normal.     Nose:     Comments: Bilateral nares slightly erythematous with clear nasal drainage noted.  Pharynx normal.  Ears normal.  Eyes normal.    Mouth/Throat:     Pharynx: Oropharynx is clear.  Eyes:     Conjunctiva/sclera: Conjunctivae normal.  Cardiovascular:     Rate and Rhythm: Normal rate and regular rhythm.     Heart sounds: Normal heart sounds. No murmur.  Pulmonary:     Effort: Pulmonary effort is normal.     Breath sounds: Normal breath sounds.     Comments: Lungs clear to auscultation Musculoskeletal:        General: Normal range of motion.     Cervical back: Normal range of motion and neck supple.  Skin:    General: Skin is warm and dry.  Neurological:     Mental Status: She is alert and oriented to person, place, and time.  Psychiatric:        Mood and Affect: Mood normal.        Behavior: Behavior normal.        Thought Content: Thought content normal.        Judgment: Judgment normal.     Diagnostics: FVC 1.45, FEV1 1.03.  Predicted FVC 2.02, predicted FEV1 1.57.  Spirometry indicates mild restriction.  Postbronchodilator spirometry FVC 1.71, FEV1 1.14.  Postbronchodilator spirometry indicates mild airway obstruction with 18% improvement in FVC and 11% improvement in FEV1.  Assessment and Plan: 1. Asthma with COPD   2. Urticaria   3. Allergic rhinitis with nonallergic component   4. Angioedema, subsequent encounter   5. History of 2019 novel coronavirus disease (COVID-19)     Meds ordered this encounter  Medications  . Budeson-Glycopyrrol-Formoterol (BREZTRI AEROSPHERE) 160-9-4.8 MCG/ACT AERO    Sig: Inhale 2 puffs into the lungs 2 (two) times daily.    Dispense:  10.7 g    Refill:  5    Patient Instructions  Asthma/COPD Stop Maria Nelson Begin Breztri -2 puffs twice a day with a spacer to prevent cough or wheeze Continue albuterol 2 puffs every 4 hours as needed for cough or wheeze You may use albuterol 2 puffs 5-15 minutes  before activity  Chronic urticaria Continuine cetirizine 10 mg once a day as needed for hives. You may take an additional cetirizine 10 mg for breakthrough hives or itching.   Allergic rhinitis Continue azelastine 2 sprays twice a day as needed for a runny nose Continue cetirizine 10 mg once a day as needed for a runny nose as listed above Consider saline nasal rinses as needed for nasal symptoms. Use this before any medicated nasal sprays for best result Begin nasal saline gel as needed for dry nostrils. Some examples include NeilMed and Ayr brands.   Reflux Continue Pantoprazole 40 mg once a day as previously prescribed.  Continue dietary and lifestyle modifications as listed below  Call the clinic if this treatment plan is not working well for you  Follow up in 2 months or sooner if needed   Return in about 2 months (around 10/10/2019), or if symptoms worsen or fail to improve.    Thank you for the opportunity to care for this patient.  Please do not hesitate to contact me with questions.  Gareth Morgan, FNP [facility]

## 2019-08-10 NOTE — Patient Instructions (Addendum)
Asthma/COPD Stop Ruthe Mannan Begin Breztri -2 puffs twice a day with a spacer to prevent cough or wheeze Continue albuterol 2 puffs every 4 hours as needed for cough or wheeze You may use albuterol 2 puffs 5-15 minutes before activity  Chronic urticaria Continuine cetirizine 10 mg once a day as needed for hives. You may take an additional cetirizine 10 mg for breakthrough hives or itching.   Allergic rhinitis Continue azelastine 2 sprays twice a day as needed for a runny nose Continue cetirizine 10 mg once a day as needed for a runny nose as listed above Consider saline nasal rinses as needed for nasal symptoms. Use this before any medicated nasal sprays for best result Begin nasal saline gel as needed for dry nostrils. Some examples include NeilMed and Ayr brands.   Reflux Continue Pantoprazole 40 mg once a day as previously prescribed.  Continue dietary and lifestyle modifications as listed below  Call the clinic if this treatment plan is not working well for you  Follow up in 2 months or sooner if needed.   Lifestyle Changes for Controlling GERD When you have GERD, stomach acid feels as if it's backing up toward your mouth. Whether or not you take medication to control your GERD, your symptoms can often be improved with lifestyle changes.   Raise Your Head  Reflux is more likely to strike when you're lying down flat, because stomach fluid can  flow backward more easily. Raising the head of your bed 4-6 inches can help. To do this:  Slide blocks or books under the legs at the head of your bed. Or, place a wedge under  the mattress. Many foam stores can make a suitable wedge for you. The wedge  should run from your waist to the top of your head.  Don't just prop your head on several pillows. This increases pressure on your  stomach. It can make GERD worse.  Watch Your Eating Habits Certain foods may increase the acid in your stomach or relax the lower esophageal sphincter,  making GERD more likely. It's best to avoid the following:  Coffee, tea, and carbonated drinks (with and without caffeine)  Fatty, fried, or spicy food  Mint, chocolate, onions, and tomatoes  Any other foods that seem to irritate your stomach or cause you pain  Relieve the Pressure  Eat smaller meals, even if you have to eat more often.  Don't lie down right after you eat. Wait a few hours for your stomach to empty.  Avoid tight belts and tight-fitting clothes.  Lose excess weight.  Tobacco and Alcohol  Avoid smoking tobacco and drinking alcohol. They can make GERD symptoms worse.

## 2019-08-13 ENCOUNTER — Telehealth: Payer: Self-pay | Admitting: *Deleted

## 2019-08-13 ENCOUNTER — Encounter: Payer: Self-pay | Admitting: Gastroenterology

## 2019-08-13 NOTE — Telephone Encounter (Signed)
PA has been submitted for Breztri through [employer] and is currently suspended/ pending.

## 2019-08-14 NOTE — Telephone Encounter (Signed)
PA has been approved. PA form has been faxed to patient's pharmacy, labeled, and placed in bulk scanning.  

## 2019-08-22 ENCOUNTER — Emergency Department (HOSPITAL_COMMUNITY): Payer: Medicaid Other

## 2019-08-22 ENCOUNTER — Encounter (HOSPITAL_COMMUNITY): Payer: Self-pay | Admitting: Emergency Medicine

## 2019-08-22 ENCOUNTER — Emergency Department (HOSPITAL_COMMUNITY)
Admission: EM | Admit: 2019-08-22 | Discharge: 2019-08-23 | Disposition: A | Discharge status: Home / Self Care | Payer: Medicaid Other | Attending: Emergency Medicine | Admitting: Emergency Medicine

## 2019-08-22 ENCOUNTER — Other Ambulatory Visit: Payer: Self-pay

## 2019-08-22 DIAGNOSIS — R0789 Other chest pain: Secondary | ICD-10-CM | POA: Insufficient documentation

## 2019-08-22 DIAGNOSIS — E669 Obesity, unspecified: Secondary | ICD-10-CM | POA: Diagnosis not present

## 2019-08-22 DIAGNOSIS — Z87891 Personal history of nicotine dependence: Secondary | ICD-10-CM | POA: Diagnosis not present

## 2019-08-22 DIAGNOSIS — Z6841 Body Mass Index (BMI) 40.0 and over, adult: Secondary | ICD-10-CM | POA: Diagnosis not present

## 2019-08-22 DIAGNOSIS — Z79899 Other long term (current) drug therapy: Secondary | ICD-10-CM | POA: Insufficient documentation

## 2019-08-22 DIAGNOSIS — Z8616 Personal history of COVID-19: Secondary | ICD-10-CM | POA: Diagnosis not present

## 2019-08-22 DIAGNOSIS — M7918 Myalgia, other site: Secondary | ICD-10-CM | POA: Insufficient documentation

## 2019-08-22 DIAGNOSIS — R5383 Other fatigue: Secondary | ICD-10-CM | POA: Diagnosis not present

## 2019-08-22 DIAGNOSIS — J449 Chronic obstructive pulmonary disease, unspecified: Secondary | ICD-10-CM | POA: Insufficient documentation

## 2019-08-22 DIAGNOSIS — R509 Fever, unspecified: Secondary | ICD-10-CM | POA: Diagnosis not present

## 2019-08-22 DIAGNOSIS — M791 Myalgia, unspecified site: Secondary | ICD-10-CM | POA: Diagnosis not present

## 2019-08-22 DIAGNOSIS — R079 Chest pain, unspecified: Secondary | ICD-10-CM | POA: Diagnosis not present

## 2019-08-22 LAB — CBC
HCT: 45 % (ref 36.0–46.0)
Hemoglobin: 14.2 g/dL (ref 12.0–15.0)
MCH: 24.6 pg — ABNORMAL LOW (ref 26.0–34.0)
MCHC: 31.6 g/dL (ref 30.0–36.0)
MCV: 77.9 fL — ABNORMAL LOW (ref 80.0–100.0)
Platelets: 270 10*3/uL (ref 150–400)
RBC: 5.78 MIL/uL — ABNORMAL HIGH (ref 3.87–5.11)
RDW: 14.3 % (ref 11.5–15.5)
WBC: 6.3 10*3/uL (ref 4.0–10.5)
nRBC: 0 % (ref 0.0–0.2)

## 2019-08-22 LAB — BASIC METABOLIC PANEL
Anion gap: 8 (ref 5–15)
BUN: 10 mg/dL (ref 8–23)
CO2: 24 mmol/L (ref 22–32)
Calcium: 9.2 mg/dL (ref 8.9–10.3)
Chloride: 102 mmol/L (ref 98–111)
Creatinine, Ser: 1.22 mg/dL — ABNORMAL HIGH (ref 0.44–1.00)
GFR calc Af Amer: 54 mL/min — ABNORMAL LOW (ref >60)
GFR calc non Af Amer: 47 mL/min — ABNORMAL LOW (ref >60)
Glucose, Bld: 90 mg/dL (ref 70–99)
Potassium: 3.9 mmol/L (ref 3.5–5.1)
Sodium: 134 mmol/L — ABNORMAL LOW (ref 135–145)

## 2019-08-22 LAB — TROPONIN I (HIGH SENSITIVITY)
Troponin I (High Sensitivity): 8 ng/L (ref <18)
Troponin I (High Sensitivity): 7 ng/L (ref <18)

## 2019-08-22 MED ORDER — ACETAMINOPHEN 500 MG PO TABS
1000.0000 mg | ORAL_TABLET | Freq: Once | ORAL | Status: AC
  Administered 2019-08-23: 1000 mg via ORAL
  Filled 2019-08-22: qty 2

## 2019-08-22 MED ORDER — ONDANSETRON 4 MG PO TBDP
4.0000 mg | ORAL_TABLET | Freq: Once | ORAL | Status: AC
  Administered 2019-08-23: 4 mg via ORAL
  Filled 2019-08-22: qty 1

## 2019-08-22 NOTE — ED Triage Notes (Signed)
Patient c/o chest pain and feeling off all today after having second vaccine yesterday. Reports central chest pain.

## 2019-08-23 MED ORDER — ONDANSETRON 4 MG PO TBDP
4.0000 mg | ORAL_TABLET | Freq: Three times a day (TID) | ORAL | 0 refills | Status: DC | PRN
Start: 2019-08-23 — End: 2020-12-19

## 2019-08-23 NOTE — ED Provider Notes (Signed)
[facility] EMERGENCY DEPARTMENT Provider Note   CSN: [id number] Arrival date & time: 08/22/19  1757     History Chief Complaint  Patient presents with  . Chest Pain    Maria Nelson is a 64 y.o. female.  The history is provided by the patient and medical records.  Chest Pain Associated symptoms: fatigue    64 y.o. F with hx of asthma, COPD, alcohol abuse, hx GI bleeding, OSA on CPAP, stress incontinence, presenting to the ED with complaint of feeling poorly.  Patient repots she received second dose of Pfizer covid vaccine yesterday and has felt terrible all day today-- body aches, fatigue, no appetite, and low grade fever.  States she has the exact same symptoms after the first dose, however this afternoon she developed chest tightness as well.  Denies SOB.  No cough.    She has no known cardiac history.   She is a former smoker.  No abdominal pain, vomiting, diarrhea, or urinary symptoms.  No medications taken for symptoms PTA.  Past Medical History:  Diagnosis Date  . 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 Asthma   . Carpal tunnel syndrome 06/23/2011   Patient notes history of bilateral carpal tunnel syndrome.  Now has symptoms of right hand carpal tunnel.   [name] COPD (chronic obstructive pulmonary disease) ([place])    secondary to tobacco use // No PFTs on file  . COVID-19   . ETOH abuse    Pt stopped in 3/08  . Gastrointestinal hemorrhage    secondary to PUD on March 2008  . OSA on CPAP    noncompliant with CPAP (2/2 it being depressing)  . Peptic ulcer disease    +H. pylori (antigen)- Dr. Olevia Perches- treated  . STRESS INCONTINENCE 01/24/2007  . Tobacco abuse    quit in 2010  . Urinary incontinence   . Urticaria     Patient Active Problem List   Diagnosis Date Noted  . History of 2019 novel coronavirus disease (COVID-19) 05/11/2019  . COVID-19 04/30/2019  . Adnexal pain 09/06/2018  . SOB (shortness of breath) 05/26/2018  . Rash 05/11/2018  . Paroxysmal  atrial fibrillation ([place]) 08/24/2017  . Dyspepsia 08/24/2017  . Knee pain, left 08/16/2016  . Back pain 06/23/2016  . Urticaria 07/01/2015  . 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 Allergic rhinitis with nonallergic component 03/04/2015  . COPD (chronic obstructive pulmonary disease) ([place]) 06/19/2014  . Blood pressure elevated without history of HTN 03/05/2014  . HNP (herniated nucleus pulposus), lumbar 04/16/2013  . Tenosynovitis, de Quervain 11/15/2012  . Morbid obesity with BMI of 45.0-49.9, adult ([place]) 09/08/2012  . Carpal tunnel syndrome 06/23/2011  . Sleep apnea 09/02/2010  . Preventative health care 07/28/2010  . GERD 12/24/2009  . Urticaria/angioedema 12/16/2009  . STRESS INCONTINENCE 01/24/2007  . History of peptic ulcer disease 07/26/2006    Past Surgical History:  Procedure Laterality Date  . BREAST EXCISIONAL BIOPSY Right 1994  . MANDIBLE SURGERY     due to trauma  . TUBAL LIGATION       OB History   No obstetric history on file.     Family History  Problem Relation Age of Onset  . Diabetes Sister   . Breast cancer Sister   . Breast cancer Sister 40       died 2/2 breast ca age 65-60  . Diabetes Mother   . Coronary artery disease Mother 57       requiring 5 vessel CABG    Social  History   Tobacco Use  . Smoking status: Former Smoker    Packs/day: 1.00    Years: 30.00    Pack years: 30.00    Types: Cigarettes    Quit date: 12/04/2010    Years since quitting: 8.7  . Smokeless tobacco: Never Used  Substance Use Topics  . Alcohol use: Yes    Alcohol/week: 0.0 standard drinks    Comment: now drinking a beer on weekends //   . Drug use: No    Home Medications Prior to Admission medications   Medication Sig Start Date End Date Taking? Authorizing Provider  albuterol (PROVENTIL HFA;VENTOLIN HFA) 108 (90 BASE) MCG/ACT inhaler Inhale 2 puffs into the lungs every 6 (six) hours as needed for wheezing. 03/05/14   [name], MD  albuterol (PROVENTIL) (2.5  MG/3ML) 0.083% nebulizer solution Use 1 vial in nebulizer up to 3 times a day when albuterol inhaler not working. If no better or getting worse, call 911 or physician line 02/27/18   Bobbitt, [place] Muta, MD  atorvastatin (LIPITOR) 40 MG tablet Take 1 tablet (40 mg total) by mouth daily. Patient not taking: Reported on 05/11/2019 11/14/18   Sueanne Margarita, MD  Budeson-Glycopyrrol-Formoterol (BREZTRI AEROSPHERE) 160-9-4.8 MCG/ACT AERO Inhale 2 puffs into the lungs 2 (two) times daily. 08/10/19   Dara Hoyer, FNP  cetirizine (ZYRTEC) 10 MG tablet Take 1 tablet by mouth once daily 02/13/19   Madalyn Rob, MD  EPINEPHrine (EPIPEN 2-PAK) 0.3 mg/0.3 mL IJ SOAJ injection Inject 0.3 mLs (0.3 mg total) into the muscle as needed (for allergic reaction). 02/21/19   Madalyn Rob, MD  metoprolol tartrate (LOPRESSOR) 100 MG tablet Take 1 tablet, 100 mg, by mouth, 2 hours before your Cardiac CT. 09/08/18   Sueanne Margarita, MD  mometasone-formoterol (DULERA) 200-5 MCG/ACT AERO Inhale 2 puffs into the lungs 2 (two) times daily. 05/17/19   Seawell, Jaimie A, DO  pantoprazole (PROTONIX) 40 MG tablet Take 1 tablet (40 mg total) by mouth daily. 12/06/18   Madalyn Rob, MD    Allergies    Bee venom, Aspirin, and Other  Review of Systems   Review of Systems  Constitutional: Positive for fatigue.  Cardiovascular: Positive for chest pain.  Musculoskeletal: Positive for myalgias.  All other systems reviewed and are negative.   Physical Exam Updated Vital Signs BP 104/60   Pulse 64   Temp 100.1 F (37.8 C) (Oral)   Resp (!) 21   Ht 5' (1.524 m)   Wt 117 kg   LMP 04/19/2004 (Approximate)   SpO2 93%   BMI 50.38 kg/m   Physical Exam Vitals and nursing note reviewed.  Constitutional:      Appearance: She is well-developed.     Comments: Sleeping, had to be awoken for exam  HENT:     Head: Normocephalic and atraumatic.  Eyes:     Conjunctiva/sclera: Conjunctivae normal.     Pupils: Pupils are equal, round,  and reactive to light.  Cardiovascular:     Rate and Rhythm: Normal rate and regular rhythm.     Heart sounds: Normal heart sounds.  Pulmonary:     Effort: Pulmonary effort is normal.     Breath sounds: Normal breath sounds. No wheezing or rhonchi.     Comments: NAD, breathing easy and unlabored Abdominal:     General: Bowel sounds are normal.     Palpations: Abdomen is soft.  Musculoskeletal:        General: Normal range of motion.  Cervical back: Normal range of motion.  Skin:    General: Skin is warm and dry.  Neurological:     Mental Status: She is alert and oriented to person, place, and time.     Comments: Appears tired but AAOx3, moving arms and legs well, answering all questions and follow commands without issue     ED Results / Procedures / Treatments   Labs (all labs ordered are listed, but only abnormal results are displayed) Labs Reviewed  BASIC METABOLIC PANEL - Abnormal; Notable for the following components:      Result Value   Sodium 134 (*)    Creatinine, Ser 1.22 (*)    GFR calc non Af Amer 47 (*)    GFR calc Af Amer 54 (*)    All other components within normal limits  CBC - Abnormal; Notable for the following components:   RBC 5.78 (*)    MCV 77.9 (*)    MCH 24.6 (*)    All other components within normal limits  TROPONIN I (HIGH SENSITIVITY)  TROPONIN I (HIGH SENSITIVITY)    EKG EKG Interpretation  Date/Time:  Wednesday Aug 22 2019 17:59:49 EDT Ventricular Rate:  75 PR Interval:  148 QRS Duration: 70 QT Interval:  372 QTC Calculation: 415 R Axis:   -23 Text Interpretation: Normal sinus rhythm Normal ECG No significant change since last tracing Confirmed by Addison Lank [phone]) on 08/22/2019 10:52:46 PM   Radiology DG Chest 2 View  Result Date: 08/22/2019 CLINICAL DATA:  Lower mid chest pain for 1 day EXAM: CHEST - 2 VIEW COMPARISON:  04/30/2019 FINDINGS: Frontal and lateral views of the chest demonstrate a stable cardiac silhouette.  Background emphysema and hyperinflation unchanged. No airspace disease, effusion, or pneumothorax. No acute bony abnormality. IMPRESSION: 1. Emphysema, no acute process. Electronically Signed   By: Randa Ngo M.D.   On: 08/22/2019 20:45    Procedures Procedures (including critical care time)  Medications Ordered in ED Medications  acetaminophen (TYLENOL) tablet 1,000 mg (1,000 mg Oral Given 08/23/19 0022)  ondansetron (ZOFRAN-ODT) disintegrating tablet 4 mg (4 mg Oral Given 08/23/19 0022)    ED Course  I have reviewed the triage vital signs and the nursing notes.  Pertinent labs & imaging results that were available during my care of the patient were reviewed by me and considered in my medical decision making (see chart for details).    MDM Rules/Calculators/A&P    64 year old female presenting to the ED with complaint of feeling poorly after receiving second [place] Covid vaccine yesterday.  She reports low-grade fever, fatigue, poor appetite, and generalized body aches.  She developed some chest tightness as well today.  Denies any shortness of breath, cough, congestion, or sore throat.  She has developed low-grade fever here of 100, but is nontoxic in appearance.  Remainder of her vital signs are stable.  Lungs are clear.  EKG is nonischemic.  Labs are reassuring including troponin x2.  Given nature of her symptoms, low suspicion for ACS, PE, dissection, acute cardiac event.  Feel this is likely side effects from her vaccine.  She does report the exact same symptoms after first dose just without chest tightness.  Will give dose of Tylenol and Zofran here.  Will p.o. challenge.  1:08 AM Patient feeling better after some medications here.  She is able to tolerate PO without difficulty.  VSS.  Feel she is stable for discharge home.  I have discussed expectant management with her, continue symptomatic control.  Encouraged to rest, stay hydrated.  Can follow-up with PCP if any ongoing issues.   Return here for any new/acute changes.   Final Clinical Impression(s) / ED Diagnoses Final diagnoses:  Chest tightness  Myalgia    Rx / DC Orders ED Discharge Orders         Ordered    ondansetron (ZOFRAN ODT) 4 MG disintegrating tablet  Every 8 hours PRN     08/23/19 0110           Larene Pickett, PA-C 08/23/19 0148    Fatima Blank, MD 08/24/19 [phone]

## 2019-08-23 NOTE — Discharge Instructions (Signed)
I would continue to keep some tylenol on hand, take when needed for body aches, low grade fevers, etc.  These are common after vaccine. Make sure to rest and stay hydrated.  Some nausea medicine has been sent to your pharmacy if needed. Follow-up with your primary care doctor. Return to the ED for new or worsening symptoms.

## 2019-08-27 ENCOUNTER — Telehealth: Payer: Self-pay | Admitting: Family Medicine

## 2019-08-27 NOTE — Telephone Encounter (Signed)
Please advise 

## 2019-08-27 NOTE — Telephone Encounter (Signed)
Patient called and states that Maria Nelson changed her inhaler from [facility] to [place] and it is causing her mouth and gums to be very sore and painful. Patient does not know what to do and would like an alternative.  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  Please advise.

## 2019-08-27 NOTE — Telephone Encounter (Signed)
Attempted to call patient. No answer. Left message.

## 2019-08-27 NOTE — Telephone Encounter (Signed)
Can you please ask if Maria Nelson helped her breathing, stayed the same, or worsened? Also please ask her if she is using a spacer and rinsing after each use? Thank you. Please assure her we can go back to [facility] if she is instant (we can add spiriva). Thank you

## 2019-08-28 NOTE — Telephone Encounter (Signed)
I called and spoke with the patient and she stated that the Judithann Sauger is helping with her breathing and that she using a spacer and rinsing her mouth out afterwards, but she is still experiencing issues with her mouth and gums being sore. Please advise.

## 2019-08-29 MED ORDER — DULERA 200-5 MCG/ACT IN AERO
2.0000 | INHALATION_SPRAY | Freq: Two times a day (BID) | RESPIRATORY_TRACT | 5 refills | Status: DC
Start: 2019-08-29 — End: 2019-11-19

## 2019-08-29 MED ORDER — SPIRIVA RESPIMAT 1.25 MCG/ACT IN AERS
2.0000 | INHALATION_SPRAY | Freq: Every day | RESPIRATORY_TRACT | 5 refills | Status: DC
Start: 2019-08-29 — End: 2019-11-19

## 2019-08-29 NOTE — Telephone Encounter (Signed)
Called and spoke with patient and advised of plan. New medications have been sent to pharmacy. Patient verbalized understanding.

## 2019-08-29 NOTE — Telephone Encounter (Signed)
Glad she is breathing better, however, we need to get her mouth feeling better now. Lets stop Breztri and change her back to [facility] 200-2 puffs twice a day with a spacer and add Spiriva 1.25 - 2 puffs once a day. Thank you

## 2019-08-29 NOTE — Addendum Note (Signed)
Addended by: Chip Boer R on: 08/29/2019 12:34 PM   Modules accepted: Orders

## 2019-08-30 ENCOUNTER — Telehealth: Payer: Self-pay | Admitting: *Deleted

## 2019-08-30 NOTE — Telephone Encounter (Signed)
Dr.Nandigam,  This patient is scheduled for a recall screening colonoscopy. Last colon with Sharlett Iles on 07/23/2009-normal exam. Her hc includes COPD,asthma,Sleep apnea, a fib. BMI on 08/22/19 was 50.38. ok for direct hospital colonoscopy or OV first? Please advise. Thank you, Robbin PV

## 2019-08-30 NOTE — Telephone Encounter (Signed)
Called patient, no answer, left a message for the patient to call us back to make an OV. 

## 2019-08-30 NOTE — Telephone Encounter (Signed)
Ok , please schedule next available appt with me or APP for office visit. Thanks

## 2019-08-31 ENCOUNTER — Encounter: Payer: Self-pay | Admitting: Gastroenterology

## 2019-08-31 NOTE — Telephone Encounter (Signed)
OV Appointment made with patient.

## 2019-09-04 ENCOUNTER — Telehealth: Payer: Self-pay | Admitting: *Deleted

## 2019-09-04 NOTE — Telephone Encounter (Signed)
PA has been initiated through [employer] for Spiriva and has been approved. PA form has been faxed to pharmacy, labeled, and placed in bulk scanning.

## 2019-09-10 IMAGING — CR DG CHEST 2V
2 series · 2 of 2 positions shown · non-contrast
Comparison: 10/01/2016

CLINICAL DATA: Mid chest pain tonight after taking ibuprofen.

EXAM:
CHEST - 2 VIEW

[chest pa]
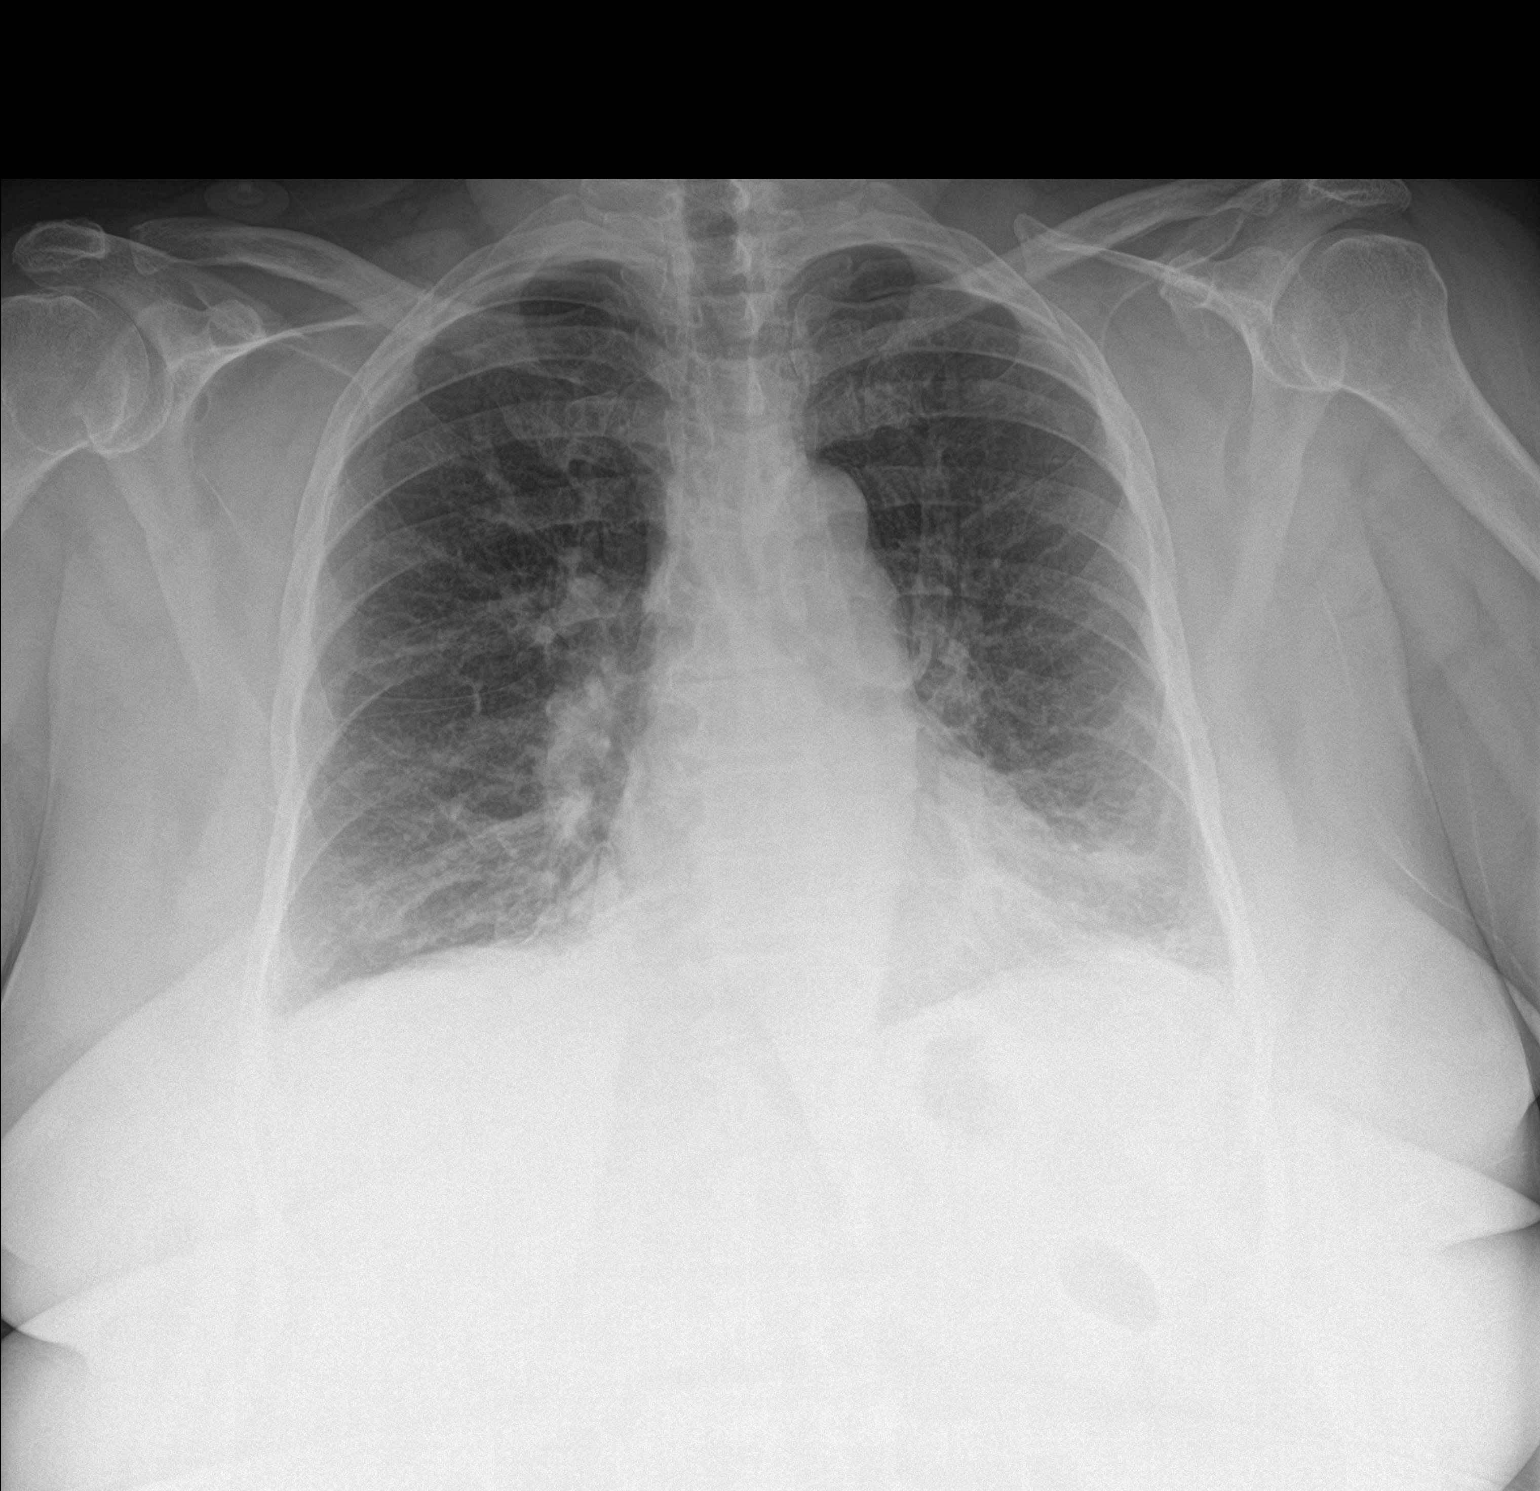

[chest lat]
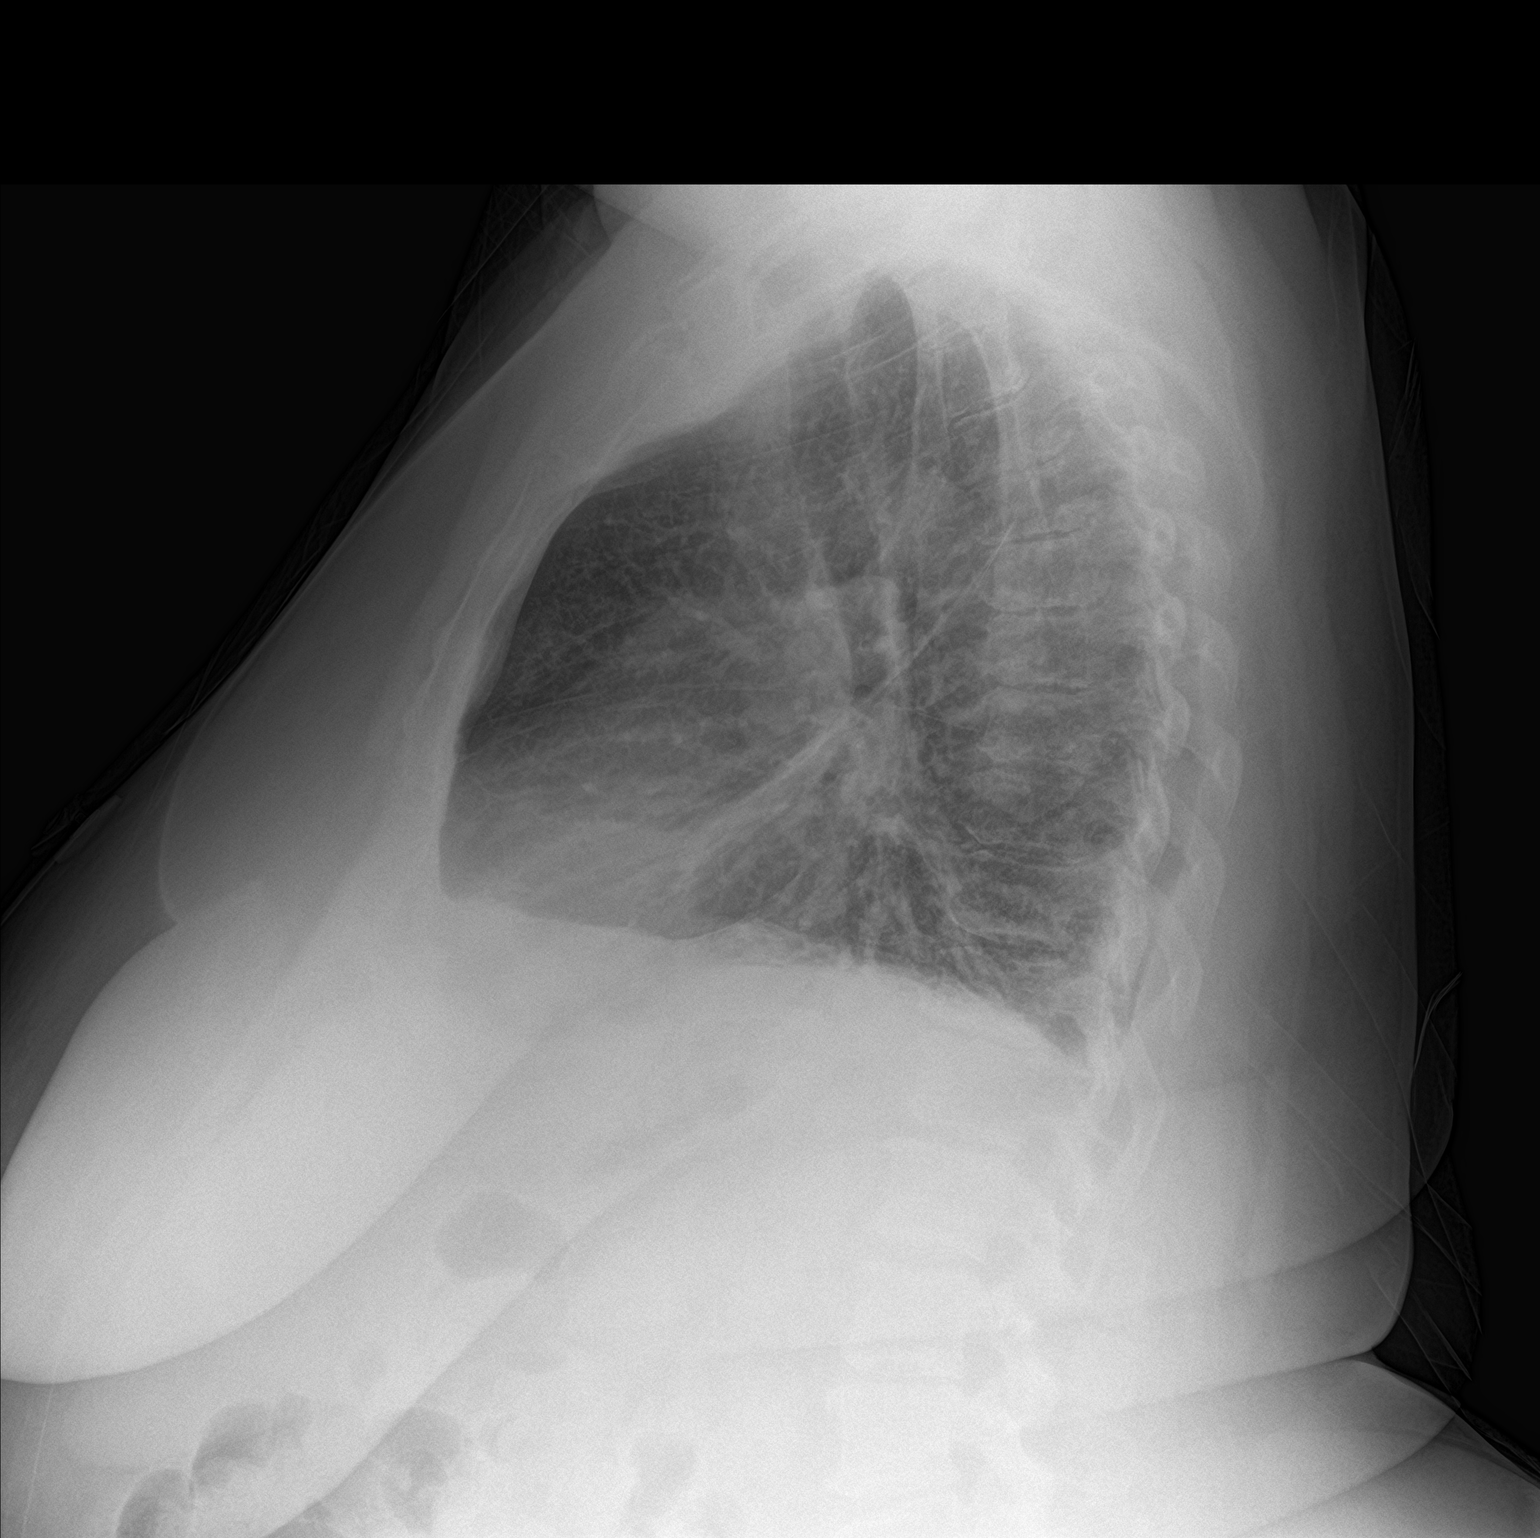

[2 of 2 positions shown; findings below may reference images not displayed]

FINDINGS: Heart and mediastinal contours are stable. No pulmonary
consolidation, effusion or pneumothorax. Chronic interstitial
prominence of the lungs with bibasilar atelectasis. Degenerative
changes are stable along the dorsal spine.
IMPRESSION: Bibasilar atelectasis with mild diffuse interstitial prominence
likely reflecting stigmata of mild chronic interstitial lung
disease.

## 2019-09-21 ENCOUNTER — Other Ambulatory Visit: Payer: Self-pay | Admitting: Internal Medicine

## 2019-09-26 ENCOUNTER — Encounter: Payer: Medicaid Other | Admitting: Gastroenterology

## 2019-10-15 ENCOUNTER — Other Ambulatory Visit: Payer: Self-pay | Admitting: Cardiology

## 2019-10-15 DIAGNOSIS — Z1231 Encounter for screening mammogram for malignant neoplasm of breast: Secondary | ICD-10-CM

## 2019-10-17 ENCOUNTER — Ambulatory Visit: Payer: Medicaid Other | Admitting: Gastroenterology

## 2019-10-17 ENCOUNTER — Encounter: Payer: Self-pay | Admitting: Gastroenterology

## 2019-10-17 VITALS — BP 104/70 | HR 96 | Ht 59.5 in | Wt 259.8 lb

## 2019-10-17 DIAGNOSIS — R140 Abdominal distension (gaseous): Secondary | ICD-10-CM

## 2019-10-17 DIAGNOSIS — R143 Flatulence: Secondary | ICD-10-CM | POA: Diagnosis not present

## 2019-10-17 DIAGNOSIS — Z1211 Encounter for screening for malignant neoplasm of colon: Secondary | ICD-10-CM

## 2019-10-17 DIAGNOSIS — Z6841 Body Mass Index (BMI) 40.0 and over, adult: Secondary | ICD-10-CM

## 2019-10-17 NOTE — Progress Notes (Signed)
Maria Nelson    [id number]    [birth year]  Primary Care Physician:Steen, Jeneen Rinks, MD  Referring Physician: Madalyn Rob, MD [address] [place],  [place] [address]   Chief complaint:  Colon cancer screening  HPI:  64 yr very pleasant female here to discuss colorectal cancer screening No family history of colon cancer Denies any change in bowel habits, nausea, vomiting, abdominal pain, melena or bright red blood per rectum  Colonoscopy July 23, 2009 by Dr. Sharlett Iles: Normal exam with no polyps  She complains of excessive flatulence and abdominal bloating.  She likes to drink milk and consumes a lot of milk and dairy products.  Denies any diarrhea she has 2-3 formed bowel movements daily  Outpatient Encounter Medications as of 10/17/2019  Medication Sig  . albuterol (PROVENTIL HFA;VENTOLIN HFA) 108 (90 BASE) MCG/ACT inhaler Inhale 2 puffs into the lungs every 6 (six) hours as needed for wheezing.  [name] albuterol (PROVENTIL) (2.5 MG/3ML) 0.083% nebulizer solution Use 1 vial in nebulizer up to 3 times a day when albuterol inhaler not working. If no better or getting worse, call 911 or physician line  . atorvastatin (LIPITOR) 40 MG tablet Take 1 tablet (40 mg total) by mouth daily.  . Budeson-Glycopyrrol-Formoterol (BREZTRI AEROSPHERE) 160-9-4.8 MCG/ACT AERO Inhale 2 puffs into the lungs 2 (two) times daily.  . cetirizine (ZYRTEC) 10 MG tablet Take 1 tablet by mouth once daily  . EPINEPHrine (EPIPEN 2-PAK) 0.3 mg/0.3 mL IJ SOAJ injection Inject 0.3 mLs (0.3 mg total) into the muscle as needed (for allergic reaction).  . metoprolol tartrate (LOPRESSOR) 100 MG tablet Take 1 tablet, 100 mg, by mouth, 2 hours before your Cardiac CT.  . mometasone-formoterol (DULERA) 200-5 MCG/ACT AERO Inhale 2 puffs into the lungs 2 (two) times daily.  . ondansetron (ZOFRAN ODT) 4 MG disintegrating tablet Take 1 tablet (4 mg total) by mouth every 8 (eight) hours as needed  for nausea.  . pantoprazole (PROTONIX) 40 MG tablet Take 1 tablet (40 mg total) by mouth daily.  . Tiotropium Bromide Monohydrate (SPIRIVA RESPIMAT) 1.25 MCG/ACT AERS Inhale 2 puffs into the lungs daily.  . mometasone-formoterol (DULERA) 200-5 MCG/ACT AERO Inhale 2 puffs into the lungs 2 (two) times daily. (Patient not taking: Reported on 10/17/2019)   No facility-administered encounter medications on file as of 10/17/2019.    Allergies as of 10/17/2019 - Review Complete 10/17/2019  Allergen Reaction Noted  . Bee venom Anaphylaxis 06/30/2012  . Aspirin Nausea Only   . Other Rash 05/10/2018    Past Medical History:  Diagnosis Date  . 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 Asthma   . Carpal tunnel syndrome 06/23/2011   Patient notes history of bilateral carpal tunnel syndrome.  Now has symptoms of right hand carpal tunnel.   [name] COPD (chronic obstructive pulmonary disease) ([place])    secondary to tobacco use // No PFTs on file  . COVID-19   . ETOH abuse    Pt stopped in 3/08  . Gastrointestinal hemorrhage    secondary to PUD on March 2008  . OSA on CPAP    noncompliant with CPAP (2/2 it being depressing)  . Peptic ulcer disease    +H. pylori (antigen)- Dr. Olevia Perches- treated  . STRESS INCONTINENCE 01/24/2007  . Tobacco abuse    quit in 2010  . Urinary incontinence   . Urticaria     Past Surgical History:  Procedure Laterality Date  . BREAST EXCISIONAL  BIOPSY Right 1994  . MANDIBLE SURGERY     due to trauma  . TUBAL LIGATION      Family History  Problem Relation Age of Onset  . Diabetes Sister   . Breast cancer Sister   . Breast cancer Sister 37       died 2/2 breast ca age 70-60  . Diabetes Mother   . Coronary artery disease Mother 55       requiring 5 vessel CABG    Social History   Socioeconomic History  . Marital status: Single    Spouse name: Not on file  . Number of children: 3  . Years of education: 12th grade  . Highest education level: Not on file  Occupational History    . Occupation: Disability    Comment: previously worked in [employer] had to stop 2/2 occupational exposures leading to exacerbations of asthma   Tobacco Use  . Smoking status: Former Smoker    Packs/day: 1.00    Years: 30.00    Pack years: 30.00    Types: Cigarettes    Quit date: 12/04/2010    Years since quitting: 8.8  . Smokeless tobacco: Never Used  Vaping Use  . Vaping Use: Never used  Substance and Sexual Activity  . Alcohol use: Yes    Alcohol/week: 0.0 standard drinks    Comment: now drinking a beer on weekends //   . Drug use: No  . Sexual activity: Not Currently  Other Topics Concern  . 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      Pt lives with her son      Pt has received financial assistance approval for 100% discount at [facility] and has [facility] card.    Social Determinants of Health   Financial Resource Strain:   . Difficulty of Paying Living Expenses:   Food Insecurity:   . Worried About Charity fundraiser in the Last Year:   . Arboriculturist in the Last Year:   Transportation Needs:   . Film/video editor (Medical):   [name] Lack of Transportation (Non-Medical):   Physical Activity:   . Days of Exercise per Week:   . Minutes of Exercise per Session:   Stress:   . Feeling of Stress :   Social Connections:   . Frequency of Communication with Friends and Family:   . Frequency of Social Gatherings with Friends and Family:   . Attends Religious Services:   . Active Member of Clubs or Organizations:   . Attends Archivist Meetings:   [name] Marital Status:   Intimate Partner Violence:   . Fear of Current or Ex-Partner:   . Emotionally Abused:   [name] Physically Abused:   . Sexually Abused:       Review of systems: All other review of systems negative except as mentioned in the HPI.   Physical Exam: Vitals:   10/17/19 1416  BP: 104/70  Pulse: 96   Body mass index is  51.6 kg/m. Gen:      No acute distress HEENT:   sclera anicteric Neuro: alert and oriented x 3 Psych: normal mood and affect  Data Reviewed:  Reviewed labs, radiology imaging, old records and pertinent past GI work up   Assessment and Plan/Recommendations:  64 year old female with morbid obesity BMI greater than 50, COPD, proximal A. fib and sleep apnea  Patient is  average risk for colorectal cancer screening, discussed various available options. She opted to proceed with Cologuard, is aware that if negative she will need recall in 3 years but if positive will need follow-up colonoscopy for evaluation  Excessive flatulence and bloating could be secondary to lactose intolerance Discussed lactose-free diet Use IBgard 1 capsule up to 3 times daily as needed  Return as needed  This visit required >30 minutes of patient care (this includes precharting, chart review, review of results, face-to-face time used for counseling as well as treatment plan and follow-up. The patient was provided an opportunity to ask questions and all were answered. The patient agreed with the plan and demonstrated an understanding of the instructions.  Damaris Hippo , MD    CC: Madalyn Rob, MD

## 2019-10-17 NOTE — Patient Instructions (Addendum)
Your provider has ordered Cologuard testing as an option for colon cancer screening. This is performed by [employer] and may be out of network with your insurance. PRIOR to completing the test, it is YOUR responsibility to contact your insurance about covered benefits for this test. Your out of pocket expense could be anywhere from $0.00 to $649.00.   When you call to check coverage with your insurer, please provide the following information:   -The ONLY provider of Cologuard is [place] code for Cologuard is [phone].  [employer] NPI # [id number]  -[employer] Tax ID # [id number]   We have already sent your demographic and insurance information to [employer] [facility] (phone number [phone]) and they should contact you within the next week regarding your test. If you have not heard from them within the next week, please call our office at [phone].    Lactose-Free Diet, Adult If you have lactose intolerance, you are not able to digest lactose. Lactose is a natural sugar found mainly in dairy milk and dairy products. You may need to avoid all foods and beverages that contain lactose. A lactose-free diet can help you do this. Which foods have lactose? Lactose is found in dairy milk and dairy products, such as:  Yogurt.  Cheese.  Butter.  Margarine.  Sour cream.  Cream.  Whipped toppings and nondairy creamers.  Ice cream and other dairy-based desserts. Lactose is also found in foods or products made with dairy milk or milk ingredients. To find out whether a food contains dairy milk or a milk ingredient, look at the ingredients list. Avoid foods with the statement "May contain milk" and foods that contain:  Milk powder.  Whey.  Curd.  Caseinate.  Lactose.  Lactalbumin.  Lactoglobulin. What are alternatives to dairy milk and foods made with milk products?  Lactose-free milk.  Soy milk with added calcium  and vitamin D.  Almond milk, coconut milk, rice milk, or other nondairy milk alternatives with added calcium and vitamin D. Note that these are low in protein.  Soy products, such as soy yogurt, soy cheese, soy ice cream, and soy-based sour cream.  Other nut milk products, such as almond yogurt, almond cheese, cashew yogurt, cashew cheese, cashew ice cream, coconut yogurt, and coconut ice cream. What are tips for following this plan?  Do not consume foods, beverages, vitamins, minerals, or medicines containing lactose. Read ingredient lists carefully.  Look for the words "lactose-free" on labels.  Use lactase enzyme drops or tablets as directed by your health care provider.  Use lactose-free milk or a milk alternative, such as soy milk or almond milk, for drinking and cooking.  Make sure you get enough calcium and vitamin D in your diet. A lactose-free eating plan can be lacking in these important nutrients.  Take calcium and vitamin D supplements as directed by your health care provider. Talk to your health care provider about supplements if you are not able to get enough calcium and vitamin D from food. What foods can I eat?  Fruits All fresh, canned, frozen, or dried fruits that are not processed with lactose. Vegetables All fresh, frozen, and canned vegetables without cheese, cream, or butter sauces. Grains Any that are not made with dairy milk or dairy products. Meats and other proteins Any meat, fish, poultry, and other protein sources that are not made with dairy milk or dairy products. Soy cheese and yogurt. Fats and oils Any that are not made  with dairy milk or dairy products. Beverages Lactose-free milk. Soy, rice, or almond milk with added calcium and vitamin D. Fruit and vegetable juices. Sweets and desserts Any that are not made with dairy milk or dairy products. Seasonings and condiments Any that are not made with dairy milk or dairy products. Calcium Calcium is  found in many foods that contain lactose and is important for bone health. The amount of calcium you need depends on your age:  Adults younger than 50 years: 1,000 mg of calcium a day.  Adults older than 50 years: 1,200 mg of calcium a day. If you are not getting enough calcium, you may get it from other sources, including:  Orange juice with calcium added. There are 300-350 mg of calcium in 1 cup of orange juice.  Calcium-fortified soy milk. There are 300-400 mg of calcium in 1 cup of calcium-fortified soy milk.  Calcium-fortified rice or almond milk. There are 300 mg of calcium in 1 cup of calcium-fortified rice or almond milk.  Calcium-fortified breakfast cereals. There are 100-1,000 mg of calcium in calcium-fortified breakfast cereals.  Spinach, cooked. There are 145 mg of calcium in  cup of cooked spinach.  Edamame, cooked. There are 130 mg of calcium in  cup of cooked edamame.  Collard greens, cooked. There are 125 mg of calcium in  cup of cooked collard greens.  Kale, frozen or cooked. There are 90 mg of calcium in  cup of cooked or frozen kale.  Almonds. There are 95 mg of calcium in  cup of almonds.  Broccoli, cooked. There are 60 mg of calcium in 1 cup of cooked broccoli. The items listed above may not be a complete list of recommended foods and beverages. Contact a dietitian for more options. What foods are not recommended? Fruits None, unless they are made with dairy milk or dairy products. Vegetables None, unless they are made with dairy milk or dairy products. Grains Any grains that are made with dairy milk or dairy products. Meats and other proteins None, unless they are made with dairy milk or dairy products. Dairy All dairy products, including milk, goat's milk, buttermilk, kefir, acidophilus milk, flavored milk, evaporated milk, condensed milk, dulce de [place], eggnog, yogurt, cheese, and cheese spreads. Fats and oils Any that are made with milk or milk  products. Margarines and salad dressings that contain milk or cheese. Cream. Half and half. Cream cheese. Sour cream. Chip dips made with sour cream or yogurt. Beverages Hot chocolate. Cocoa with lactose. Instant iced teas. Powdered fruit drinks. Smoothies made with dairy milk or yogurt. Sweets and desserts Any that are made with milk or milk products. Seasonings and condiments Chewing gum that has lactose. Spice blends if they contain lactose. Artificial sweeteners that contain lactose. Nondairy creamers. The items listed above may not be a complete list of foods and beverages to avoid. Contact a dietitian for more information. Summary  If you are lactose intolerant, it means that you have a hard time digesting lactose, a natural sugar found in milk and milk products.  Following a lactose-free diet can help you manage this condition.  Calcium is important for bone health and is found in many foods that contain lactose. Talk with your health care provider about other sources of calcium. This information is not intended to replace advice given to you by your health care provider. Make sure you discuss any questions you have with your health care provider. Document Revised: 05/03/2017 Document Reviewed: 05/03/2017 Elsevier Patient Education  [place].    I appreciate the  opportunity to care for you  Thank You   Harl Bowie , MD

## 2019-11-01 ENCOUNTER — Other Ambulatory Visit: Payer: Self-pay | Admitting: *Deleted

## 2019-11-02 MED ORDER — CETIRIZINE HCL 10 MG PO TABS: 10.0000 mg | ORAL_TABLET | Freq: Every day | ORAL | 1 refills | Status: DC

## 2019-11-07 ENCOUNTER — Encounter (HOSPITAL_COMMUNITY): Payer: Self-pay

## 2019-11-07 ENCOUNTER — Other Ambulatory Visit: Payer: Self-pay

## 2019-11-07 ENCOUNTER — Ambulatory Visit (HOSPITAL_COMMUNITY)
Admission: EM | Admit: 2019-11-07 | Discharge: 2019-11-07 | Disposition: A | Discharge status: Home / Self Care | Payer: Medicaid Other | Attending: Family Medicine | Admitting: Family Medicine

## 2019-11-07 DIAGNOSIS — N3090 Cystitis, unspecified without hematuria: Secondary | ICD-10-CM | POA: Insufficient documentation

## 2019-11-07 MED ORDER — CEPHALEXIN 500 MG PO CAPS
500.0000 mg | ORAL_CAPSULE | Freq: Two times a day (BID) | ORAL | 0 refills | Status: DC
Start: 2019-11-07 — End: 2020-08-15

## 2019-11-07 NOTE — ED Provider Notes (Signed)
[facility]    ASSESSMENT & PLAN:  1. Cystitis     Begin: Meds ordered this encounter  Medications  . cephALEXin (KEFLEX) 500 MG capsule    Sig: Take 1 capsule (500 mg total) by mouth 2 (two) times daily.    Dispense:  10 capsule    Refill:  0    No signs of pyelonephritis. Discussed. Urine culture sent. Will notify patient of any significant results. Will follow up with her PCP or here if not showing improvement over the next 48 hours, sooner if needed.  Outlined signs and symptoms indicating need for more acute intervention. Patient verbalized understanding. After Visit Summary given.  SUBJECTIVE:  Maria Nelson is a 64 y.o. female who complains of urinary frequency, urgency and dysuria for the past 3-4 days; mild lower abdominal discomfort; does not limit ADL. Without associated flank pain, fever, chills, vaginal discharge or bleeding. Gross hematuria: not present. No specific aggravating or alleviating factors reported. No LE edema. Normal PO intake without n/v/d. Without specific abdominal pain. Ambulatory without difficulty.  H/O UTI: none.  LMP: Patient's last menstrual period was 04/19/2004 (approximate).    OBJECTIVE:  Vitals:   11/07/19 1023  BP: 124/70  Pulse: 70  Resp: 20  Temp: 99.2 F (37.3 C)  TempSrc: Oral  SpO2: 98%   General appearance: alert; no distress Lungs: unlabored respirations Abdomen: no pain reported Back: no CVA tenderness reported Extremities: no edema; symmetrical with no gross deformities Skin: warm and dry Neurologic: normal gait Psychological: alert and cooperative; normal mood and affect  Labs Reviewed  URINE CULTURE    Allergies  Allergen Reactions  . Bee Venom Anaphylaxis  . Aspirin Nausea Only  . Other Rash    EKG pads cause burning sensation as soon as applied    Past Medical History:  Diagnosis Date  . 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 Asthma   . Carpal tunnel syndrome 06/23/2011   Patient  notes history of bilateral carpal tunnel syndrome.  Now has symptoms of right hand carpal tunnel.   [name] COPD (chronic obstructive pulmonary disease) ([place])    secondary to tobacco use // No PFTs on file  . COVID-19   . ETOH abuse    Pt stopped in 3/08  . Gastrointestinal hemorrhage    secondary to PUD on March 2008  . OSA on CPAP    noncompliant with CPAP (2/2 it being depressing)  . Peptic ulcer disease    +H. pylori (antigen)- Dr. Olevia Perches- treated  . STRESS INCONTINENCE 01/24/2007  . Tobacco abuse    quit in 2010  . Urinary incontinence   . Urticaria    Social History   Socioeconomic History  . Marital status: Single    Spouse name: Not on file  . Number of children: 3  . Years of education: 12th grade  . Highest education level: Not on file  Occupational History  . Occupation: Disability    Comment: previously worked in [employer] had to stop 2/2 occupational exposures leading to exacerbations of asthma   Tobacco Use  . Smoking status: Former Smoker    Packs/day: 1.00    Years: 30.00    Pack years: 30.00    Types: Cigarettes    Quit date: 12/04/2010    Years since quitting: 8.9  . Smokeless tobacco: Never Used  Vaping Use  . Vaping Use: Never used  Substance and Sexual Activity  . Alcohol use: Yes    Alcohol/week: 0.0 standard drinks  Comment: now drinking a beer on weekends //   . Drug use: No  . Sexual activity: Not Currently  Other Topics Concern  . 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      Pt lives with her son      Pt has received financial assistance approval for [facility]% discount at [facility] and has [facility] card.    Social Determinants of Health   Financial Resource Strain:   . Difficulty of Paying Living Expenses:   Food Insecurity:   . Worried About Charity fundraiser in the Last Year:   . Arboriculturist in the Last Year:   Transportation Needs:   . Film/video editor  (Medical):   [name] Lack of Transportation (Non-Medical):   Physical Activity:   . Days of Exercise per Week:   . Minutes of Exercise per Session:   Stress:   . Feeling of Stress :   Social Connections:   . Frequency of Communication with Friends and Family:   . Frequency of Social Gatherings with Friends and Family:   . Attends Religious Services:   . Active Member of Clubs or Organizations:   . Attends Archivist Meetings:   [name] Marital Status:   Intimate Partner Violence:   . Fear of Current or Ex-Partner:   . Emotionally Abused:   [name] Physically Abused:   . Sexually Abused:    Family History  Problem Relation Age of Onset  . Diabetes Sister   . Breast cancer Sister   . Breast cancer Sister 81       died 2/2 breast ca age 33-60  . Diabetes Mother   . Coronary artery disease Mother 70       requiring 5 vessel CABG       Vanessa Kick, MD 11/07/19 1124

## 2019-11-07 NOTE — ED Notes (Signed)
Urinalysis testing exceeded the limitations of the Cl[employer] capabilities. Notified provider. Urine culture ordered

## 2019-11-07 NOTE — ED Triage Notes (Signed)
Pt presents with burning when urinating ad lower abdominal pain x 4 days. Denies fever, nausea, back pain. Pt not taking medications for the complaints.

## 2019-11-09 LAB — URINE CULTURE: Culture: >=100000 — AB

## 2019-11-19 ENCOUNTER — Ambulatory Visit: Payer: Medicaid Other | Admitting: Internal Medicine

## 2019-11-19 VITALS — BP 133/86 | HR 76 | Temp 98.1°F | Ht 60.0 in | Wt 262.2 lb

## 2019-11-19 DIAGNOSIS — R7989 Other specified abnormal findings of blood chemistry: Secondary | ICD-10-CM

## 2019-11-19 DIAGNOSIS — E785 Hyperlipidemia, unspecified: Secondary | ICD-10-CM

## 2019-11-19 DIAGNOSIS — R350 Frequency of micturition: Secondary | ICD-10-CM

## 2019-11-19 NOTE — Patient Instructions (Signed)
Thank you for trusting me with your care. To recap, today we discussed the following:   Hyperlipidemia, unspecified hyperlipidemia type - Plan: Lipid Profile  Elevated serum creatinine - Plan: BMP8+Anion Gap   I will follow up if your labs are abnormal.    My best,  Tamsen Snider , MD

## 2019-11-19 NOTE — Progress Notes (Signed)
   CC: follow up of UTI    HPI:[name] is a 64 y.o. female who presents for evaluation after a recent UTI. Patient was recently prescribed Keflex and today she said the burning with urination has gone away. This past week she had some increased frequency and her urinalysis today is normal. We discussed continuing to monitor her symptoms, but it is likely resolved because her pain and discomfort with urination has gone away.  Please see individual problem based A/P for details on hyperlipidemia.    Past Medical History:  Diagnosis Date  . 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 Asthma   . Carpal tunnel syndrome 06/23/2011   Patient notes history of bilateral carpal tunnel syndrome.  Now has symptoms of right hand carpal tunnel.   [name] COPD (chronic obstructive pulmonary disease) ([place])    secondary to tobacco use // No PFTs on file  . COVID-19   . ETOH abuse    Pt stopped in 3/08  . Gastrointestinal hemorrhage    secondary to PUD on March 2008  . OSA on CPAP    noncompliant with CPAP (2/2 it being depressing)  . Peptic ulcer disease    +H. pylori (antigen)- Dr. Olevia Perches- treated  . STRESS INCONTINENCE 01/24/2007  . Tobacco abuse    quit in 2010  . Urinary incontinence   . Urticaria    Review of Systems:  ROS negative except as mentioned in individual problem based A/P.   Physical Exam: Vitals:   11/19/19 1559  BP: 133/86  Pulse: 76  Temp: 98.1 F (36.7 C)  TempSrc: Oral  SpO2: 96%  Weight: 262 lb 3.2 oz (118.9 kg)  Height: 5' (1.524 m)    General: NAD, obese HE: Normocephalic, atraumatic , EOMI, Conjunctivae normal ENT: No congestion, no rhinorrhea, no exudate or erythema  Cardiovascular: Normal rate, regular rhythm.  No murmurs, rubs, or gallops Pulmonary : Effort normal, breath sounds normal. No wheezes, rales, or rhonchi Abdominal: soft, nontender,  bowel sounds present   Assessment & Plan:   See Encounters Tab for problem based charting.  Patient discussed  with Dr. Dareen Piano

## 2019-11-20 LAB — BMP8+ANION GAP
Anion Gap: 15 mmol/L (ref 10.0–18.0)
BUN/Creatinine Ratio: 14 (ref 12–28)
BUN: 16 mg/dL (ref 8–27)
CO2: 23 mmol/L (ref 20–29)
Calcium: 9.8 mg/dL (ref 8.7–10.3)
Chloride: 102 mmol/L (ref 96–106)
Creatinine, Ser: 1.18 mg/dL — ABNORMAL HIGH (ref 0.57–1.00)
GFR calc Af Amer: 56 mL/min/{1.73_m2} — ABNORMAL LOW (ref >59)
GFR calc non Af Amer: 49 mL/min/{1.73_m2} — ABNORMAL LOW (ref >59)
Glucose: 75 mg/dL (ref 65–99)
Potassium: 4.7 mmol/L (ref 3.5–5.2)
Sodium: 140 mmol/L (ref 134–144)

## 2019-11-20 LAB — LIPID PANEL
Chol/HDL Ratio: 3.7 ratio (ref 0.0–4.4)
Cholesterol, Total: 209 mg/dL — ABNORMAL HIGH (ref 100–199)
HDL: 56 mg/dL (ref >39)
LDL Chol Calc (NIH): 135 mg/dL — ABNORMAL HIGH (ref 0–99)
Triglycerides: 102 mg/dL (ref 0–149)
VLDL Cholesterol Cal: 18 mg/dL (ref 5–40)

## 2019-11-22 DIAGNOSIS — E785 Hyperlipidemia, unspecified: Secondary | ICD-10-CM | POA: Insufficient documentation

## 2019-11-22 DIAGNOSIS — N390 Urinary tract infection, site not specified: Secondary | ICD-10-CM | POA: Insufficient documentation

## 2019-11-22 NOTE — Assessment & Plan Note (Signed)
On review of patients medications she was on lipito, but reports she thought this was just for a few months. She is no longer taking this medication and would like her lipid panel checked before restarting.  Plan: Check lipid panel  The 10-year ASCVD risk score Mikey Bussing DC Jr., et al., 2013) is: 8.3%   Values used to calculate the score:     Age: 64 years     Sex: Female     Is Non-Hispanic African American: Yes     Diabetic: No     Tobacco smoker: No     Systolic Blood Pressure: 641 mmHg     Is BP treated: No     HDL Cholesterol: 56 mg/dL     Total Cholesterol: 209 mg/dL   A/P Addendum: Patient with intermediate risk, will have discussion or risk benefits and document shared descision to start/not start statin.

## 2019-11-23 NOTE — Progress Notes (Signed)
Internal Medicine Clinic Attending  Case discussed with Dr. Steen  At the time of the visit.  We reviewed the resident's history and exam and pertinent patient test results.  I agree with the assessment, diagnosis, and plan of care documented in the resident's note.  

## 2019-11-28 ENCOUNTER — Ambulatory Visit
Admission: RE | Admit: 2019-11-28 | Discharge: 2019-11-28 | Disposition: A | Discharge status: Home / Self Care | Payer: Medicaid Other | Source: Ambulatory Visit | Attending: Cardiology | Admitting: Cardiology

## 2019-11-28 ENCOUNTER — Other Ambulatory Visit: Payer: Self-pay

## 2019-11-28 DIAGNOSIS — Z1231 Encounter for screening mammogram for malignant neoplasm of breast: Secondary | ICD-10-CM | POA: Diagnosis not present

## 2020-02-16 ENCOUNTER — Other Ambulatory Visit: Payer: Self-pay | Admitting: Internal Medicine

## 2020-04-02 ENCOUNTER — Other Ambulatory Visit: Payer: Self-pay | Admitting: Obstetrics and Gynecology

## 2020-04-02 NOTE — Patient Outreach (Signed)
Care Coordination  04/02/2020  Maria Nelson 02-27-1956 [id number]    Medicaid Managed Care   Unsuccessful Outreach Note  04/02/2020 Name: [name] MRN: [id number] DOB: November 04, 1955  Referred by: Madalyn Rob, MD Reason for referral : High Risk Managed Medicaid (Unsuccessful telephone outreach)   An unsuccessful telephone outreach was attempted today. The patient was referred to the case management team for assistance with care management and care coordination.   Follow Up Plan: RNCM will follow up with patient within 30 days.  SIGNATURE

## 2020-04-02 NOTE — Patient Instructions (Signed)
Hi Ms. Printz, sorry we missed you today  - as a part of your Medicaid benefit, you are eligible for care management and care coordination services at no cost or copay. I was unable to reach you by phone today but would be happy to help you with your health related needs. Please feel free to call me at [phone].  A member of the Managed Medicaid care management team will reach out to you again over the next 30 days.   Aida Raider RN, BSN [place]  [facility] - Managed Medicaid High Risk [phone].

## 2020-04-11 ENCOUNTER — Other Ambulatory Visit: Payer: Self-pay | Admitting: Family Medicine

## 2020-04-28 ENCOUNTER — Other Ambulatory Visit: Payer: Self-pay

## 2020-04-28 ENCOUNTER — Other Ambulatory Visit: Payer: Self-pay | Admitting: Obstetrics and Gynecology

## 2020-04-28 NOTE — Patient Outreach (Signed)
Medicaid Managed Care   Nurse Care Manager Note  04/28/2020 Name:  Maria Nelson MRN:  [id number] DOB:  04-May-1955  Maria Nelson is an 65 y.o. year old female who is a primary patient of Madalyn Rob, MD.  [facility] Managed Care Coordination team was consulted for assistance with:    chronic healthcare management needs.  [name] was given information about Medicaid Managed Care Coordination team services today. [name] agreed to services and verbal consent obtained.  Engaged with patient by telephone for initial visit in response to provider referral for case management and/or care coordination services.   Assessments/Interventions:  Review of past medical history, allergies, medications, health status, including review of consultants reports, laboratory and other test data, was performed as part of comprehensive evaluation and provision of chronic care management services.  SDOH (Social Determinants of Health) assessments and interventions performed:   Care Plan  Allergies  Allergen Reactions  . Bee Venom Anaphylaxis  . Aspirin Nausea Only  . Other Rash    EKG pads cause burning sensation as soon as applied    Medications Reviewed Today    Reviewed by Gayla Medicus, RN (Registered Nurse) on 04/28/20 at 1445  Med List Status: <None>  Medication Order Taking? Sig Documenting Provider Last Dose Status Informant  albuterol (PROVENTIL HFA;VENTOLIN HFA) 108 (90 BASE) MCG/ACT inhaler [id number] Yes Inhale 2 puffs into the lungs every 6 (six) hours as needed for wheezing. [name], MD Taking Active Self  albuterol (PROVENTIL) (2.5 MG/3ML) 0.083% nebulizer solution [id number] Yes Use 1 vial in nebulizer up to 3 times a day when albuterol inhaler not working. If no better or getting worse, call 911 or physician line Bobbitt, [place] Muta, MD Taking Active Self  BREZTRI AEROSPHERE 160-9-4.8 MCG/ACT AERO [id number] Yes INHALE 2  PUFFS TWICE DAILY Ambs, Kathrine Cords, FNP Taking Active   cephALEXin (KEFLEX) 500 MG capsule [id number]  Take 1 capsule (500 mg total) by mouth 2 (two) times daily. Vanessa Kick, MD  Active   cetirizine (ZYRTEC) 10 MG tablet [id number] No Take 1 tablet (10 mg total) by mouth daily.  Patient not taking: Reported on 04/28/2020   Iona Beard, MD Not Taking Active   EPINEPHrine (EPIPEN 2-PAK) 0.3 mg/0.3 mL IJ SOAJ injection [id number]  Inject 0.3 mLs (0.3 mg total) into the muscle as needed (for allergic reaction).  Patient taking differently: Inject 0.3 mg into the muscle as needed (for allergic reaction). Has expired-needs refill   Madalyn Rob, MD  Active Self  ondansetron (ZOFRAN ODT) 4 MG disintegrating tablet [id number] Yes Take 1 tablet (4 mg total) by mouth every 8 (eight) hours as needed for nausea. Larene Pickett, PA-C Taking Active   pantoprazole (PROTONIX) 40 MG tablet [id number] Yes Take 1 tablet by mouth once daily Madalyn Rob, MD Taking Active           Patient Active Problem List   Diagnosis Date Noted  . Hyperlipidemia 11/22/2019  . UTI (urinary tract infection) 11/22/2019  . History of 2019 novel coronavirus disease (COVID-19) 05/11/2019  . COVID-19 04/30/2019  . Adnexal pain 09/06/2018  . SOB (shortness of breath) 05/26/2018  . Rash 05/11/2018  . Paroxysmal atrial fibrillation ([place]) 08/24/2017  . Dyspepsia 08/24/2017  . Knee pain, left 08/16/2016  . Back pain 06/23/2016  . Urticaria 07/01/2015  . 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 Allergic rhinitis with nonallergic component 03/04/2015  . COPD (chronic obstructive pulmonary disease) ([place]) 06/19/2014  . Blood pressure  elevated without history of HTN 03/05/2014  . HNP (herniated nucleus pulposus), lumbar 04/16/2013  . Tenosynovitis, de Quervain 11/15/2012  . Morbid obesity with BMI of 45.0-49.9, adult ([place]) 09/08/2012  . Carpal tunnel syndrome 06/23/2011  . Sleep apnea 09/02/2010  . Preventative health care 07/28/2010  .  GERD 12/24/2009  . Urticaria/angioedema 12/16/2009  . STRESS INCONTINENCE 01/24/2007  . History of peptic ulcer disease 07/26/2006    Conditions to be addressed/monitored per PCP order:  as stated above.  Patient Care Plan: Asthma (Adult)    Problem Identified: Disease Progression (Asthma)     Goal: Disease Progression Prevented or Minimized   Note:   Evidence-based guidance:   Assess symptom control by the frequency of daytime and nighttime asthma symptoms, reliever use and activity limitation at every encounter.   Note: Asthma control test, sputum eosinophil counts or similar may be added at intervals determined by team.   Identify comorbidity that may exacerbate symptoms, such as gastroesophageal reflux disease, rhinitis, obesity, obstructive sleep apnea, depression or anxiety.   Assess and reinforce correct inhaler technique frequently.   Encourage development and acceptance of self-management (asthma action) plan.   Consider home pulse oximetry for patients with decreased perception of hypoxia.   Identify and assist patient to manage identified triggers, such as smoking, viruses, eczema, weather changes, emotional upsets, excessive exercise, obesity and gastroesophageal reflux, as well as environmental allergens, including    pollen, dust mites, mold, pets and smoke; consider occupational-exposure reduction versus elimination to avoid compromising employment status.   Prepare for use of pharmacologic therapy, such as quick reliever, long-term controller, antihistamine, oral steroid, monoclonal anti-immunoglobulin E, immunizations in a stepped approach, based on presenting signs/symptoms and    laboratory results; monitor side effects and expect periodic adjustments.   Prepare patient for laboratory and diagnostic exams based on risk and presentation.   Provide smoking cessation intervention using screening, brief intervention and referral for treatment.   Review  medication use, including delivery method, dose, frequency; identify barriers to adherence, such as access to medication, adverse reaction or lack of skill.    Task: Monitor and Manage Adherence to Asthma Therapy       Problem Identified: Symptom Exacerbation (Asthma)     Goal: Symptom Exacerbation Prevented or Minimized   Note:   Evidence-based guidance:   Assess risk for exacerbation (flare-up) by evaluating personal best forced expiratory volume in 1 second intervals; anticipate treatment adjustments based on risk and resources.    Encourage pulmonary rehabilitation, occupational or physical therapy for breathing retraining, respiratory muscle and physical training, especially when functional impairments are present.   Arrange early follow-up visit (within 1 to 2 weeks) after any symptom flare-up.   Assess adherence, inhaler technique, persistent trigger exposure, barriers to treatment plan and lifestyle changes.   Assess and monitor for signs/symptoms of psychosocial concerns, such as shortness of breath-anxiety cycle that may impact stability of symptoms.   Encourage adherence with treatment plan, even when symptoms are ?ocontrolled? or infrequent.   Identify economic resources, sociocultural beliefs, social factors and health literacy that may interfere with adherence.   Establish a mutually agreed upon early intervention process and asthma action plan to communicate with primary care provider when signs/symptoms worsen.   Promote lifestyle changes when needed, including regular physical activity based on tolerance, weight loss, healthy eating and stress management.   Consider referral to nurse or community health worker or home-visiting program for intensive support and education (disease-management program).     Medicaid Managed  Care (see longitudinal plan of care for additional care plan information)  Current Barriers:  [name] Medication procurement  Nurse Case Manager  Clinical Goal(s):  [name] Over the next 30 days, patient will verbalize understanding of plan for medication procurement. . Over the next 30 days, patient will work with provider to address needs related to medication refills. . Over the next 30 days, patient will attend all scheduled medical appointments.  Interventions:  . Inter-disciplinary care team collaboration (see longitudinal plan of care) . Evaluation of current treatment plan related to asthma and patient's adherence to plan as established by provider. . Advised patient to contact provider's office for medication refills and appointment. . Provided education to patient re: medication procurement. . Reviewed medications with patient. . Discussed plans with patient for ongoing care management follow up and provided patient with direct contact information for care management team  Patient Self Care Activities:  . Patient will contact provider's office for appointment and medication refills. . Patient verbalizes understanding of plan to obtain medications, . Self administers medications as prescribed . Attends all scheduled provider appointments . Calls pharmacy for medication refills . Calls provider office for new concerns or questions . Patient will schedule appointment for flu vaccine and COVID booster.  Follow Up:  Patient agrees to Care Plan and Follow-up.  Plan: The Managed Medicaid care management team will reach out to the patient again over the next 30 days. and The patient has been provided with contact information for the Managed Medicaid care management team and has been advised to call with any health related questions or concerns.  Date/time of next scheduled RN care management/care coordination outreach:  05/29/20 at 1030

## 2020-04-28 NOTE — Patient Instructions (Signed)
Hi Maria Nelson, thank you for speaking with me today.  Maria Nelson was given information about Medicaid Managed Care team care coordination services as a part of their [place] Medicaid benefit. Maria Nelson verbally consented to engagement with the [facility] Managed Care team.   For questions related to your [facility], please call: [phone] or visit the homepage here: [email or web address]  If you would like to schedule transportation through your [facility], please call the following number at least 2 days in advance of your appointment: [phone]   Patient Care Plan: Asthma (Adult)    Problem Identified: Disease Progression (Asthma)     Goal: Disease Progression Prevented or Minimized   Note:   Evidence-based guidance:   Assess symptom control by the frequency of daytime and nighttime asthma symptoms, reliever use and activity limitation at every encounter.   Note: Asthma control test, sputum eosinophil counts or similar may be added at intervals determined by team.   Identify comorbidity that may exacerbate symptoms, such as gastroesophageal reflux disease, rhinitis, obesity, obstructive sleep apnea, depression or anxiety.   Assess and reinforce correct inhaler technique frequently.   Encourage development and acceptance of self-management (asthma action) plan.   Consider home pulse oximetry for patients with decreased perception of hypoxia.   Identify and assist patient to manage identified triggers, such as smoking, viruses, eczema, weather changes, emotional upsets, excessive exercise, obesity and gastroesophageal reflux, as well as environmental allergens, including    pollen, dust mites, mold, pets and smoke; consider occupational-exposure reduction versus elimination to avoid compromising employment status.   Prepare for  use of pharmacologic therapy, such as quick reliever, long-term controller, antihistamine, oral steroid, monoclonal anti-immunoglobulin E, immunizations in a stepped approach, based on presenting signs/symptoms and    laboratory results; monitor side effects and expect periodic adjustments.   Prepare patient for laboratory and diagnostic exams based on risk and presentation.   Provide smoking cessation intervention using screening, brief intervention and referral for treatment.   Review medication use, including delivery method, dose, frequency; identify barriers to adherence, such as access to medication, adverse reaction or lack of skill.           Problem Identified: Symptom Exacerbation (Asthma)     Goal: Symptom Exacerbation Prevented or Minimized   Note:   Evidence-based guidance:   Assess risk for exacerbation (flare-up) by evaluating personal best forced expiratory volume in 1 second intervals; anticipate treatment adjustments based on risk and resources.    Encourage pulmonary rehabilitation, occupational or physical therapy for breathing retraining, respiratory muscle and physical training, especially when functional impairments are present.   Arrange early follow-up visit (within 1 to 2 weeks) after any symptom flare-up.   Assess adherence, inhaler technique, persistent trigger exposure, barriers to treatment plan and lifestyle changes.   Assess and monitor for signs/symptoms of psychosocial concerns, such as shortness of breath-anxiety cycle that may impact stability of symptoms.   Encourage adherence with treatment plan, even when symptoms are ?ocontrolled? or infrequent.   Identify economic resources, sociocultural beliefs, social factors and health literacy that may interfere with adherence.   Establish a mutually agreed upon early intervention process and asthma action plan to communicate with primary care provider when signs/symptoms worsen.   Promote lifestyle  changes when needed, including regular physical activity based on tolerance, weight loss, healthy eating and stress management.   Consider referral to nurse or community health worker or home-visiting program for intensive  support and education (disease-management program).            Patient Care Plan: Asthma (Adult)    Problem Identified: Disease Progression (Asthma)     Long-Range Goal: Disease Progression Prevented or Minimized   Start Date: 04/28/2020  Expected End Date: 07/27/2020  This Visit's Progress: On track  Priority: Medium  Note:   CARE PLAN ENTRY Medicaid Managed Care (see longitudinal plan of care for additional care plan information)  Current Barriers:  [name] Medication procurement  Nurse Case Manager Clinical Goal(s):  [name] Over the next 30 days, patient will verbalize understanding of plan for medication procurement. . Over the next 30 days, patient will work with provider to address needs related to medication refills. . Over the next 30 days, patient will attend all scheduled medical appointments.  Interventions:  . Inter-disciplinary care team collaboration (see longitudinal plan of care) . Evaluation of current treatment plan related to asthma and patient's adherence to plan as established by provider. . Advised patient to contact provider's office for medication refills and appointment. . Provided education to patient re: medication procurement. . Reviewed medications with patient. . Discussed plans with patient for ongoing care management follow up and provided patient with direct contact information for care management team  Patient Self Care Activities:  . Patient will contact provider's office for appointment and medication refills. . Patient verbalizes understanding of plan to obtain medications, . Self administers medications as prescribed . Attends all scheduled provider appointments . Calls pharmacy for medication refills . Calls provider office for  new concerns or questions . Patient will schedule appointment for flu vaccine and COVID booster.             Patient verbalizes understanding of instructions provided today.   The Managed Medicaid care management team will reach out to the patient again over the next 30 days.  The patient has been provided with contact information for the Managed Medicaid care management team and has been advised to call with any health related questions or concerns.   Aida Raider RN, BSN [place]  [facility] - Managed Medicaid High Risk [phone].

## 2020-05-01 NOTE — Patient Instructions (Addendum)
Asthma/COPD Continue Breztri 2 puffs twice a day with spacer to help prevent cough and wheeze Continue albuterol 2 puffs every 4 hours as needed for cough or wheeze OR albuterol via nebulizer 1 unit dose every 4-6 hours as needed for cough, wheeze, tightness in chest, or shortness of breath. You may use albuterol 2 puffs 5-15 minutes before activity We will get some lab work to follow up on your asthma (cbc with diff, allergen zone 2, and IgE. We will call you with lab results.  Chronic urticaria Continuine cetirizine 10 mg once a day as needed for hives. You may take an additional cetirizine 10 mg for breakthrough hives or itching.   Allergic rhinitis Start azelastine 2 sprays twice a day as needed for a runny nose Continue cetirizine 10 mg once a day as needed for a runny nose as listed above Consider saline nasal rinses as needed for nasal symptoms. Use this before any medicated nasal sprays for best result  Reflux Continue Pantoprazole 40 mg once a day as previously prescribed.  Continue dietary and lifestyle modifications as listed below  Call the clinic if this treatment plan is not working well for you   Schedule a follow up appointment in 4 weeks

## 2020-05-02 ENCOUNTER — Ambulatory Visit (INDEPENDENT_AMBULATORY_CARE_PROVIDER_SITE_OTHER): Payer: Medicaid Other | Admitting: Family

## 2020-05-02 ENCOUNTER — Encounter: Payer: Self-pay | Admitting: Family

## 2020-05-02 ENCOUNTER — Other Ambulatory Visit: Payer: Self-pay

## 2020-05-02 VITALS — BP 124/78 | HR 71 | Temp 97.5°F | Resp 18 | Ht 62.0 in | Wt 259.2 lb

## 2020-05-02 DIAGNOSIS — T783XXD Angioneurotic edema, subsequent encounter: Secondary | ICD-10-CM | POA: Diagnosis not present

## 2020-05-02 DIAGNOSIS — J3089 Other allergic rhinitis: Secondary | ICD-10-CM

## 2020-05-02 DIAGNOSIS — Z8616 Personal history of COVID-19: Secondary | ICD-10-CM

## 2020-05-02 DIAGNOSIS — J449 Chronic obstructive pulmonary disease, unspecified: Secondary | ICD-10-CM

## 2020-05-02 DIAGNOSIS — L501 Idiopathic urticaria: Secondary | ICD-10-CM

## 2020-05-02 MED ORDER — CETIRIZINE HCL 10 MG PO TABS: 10.0000 mg | ORAL_TABLET | Freq: Every day | ORAL | 1 refills | Status: DC

## 2020-05-02 MED ORDER — PANTOPRAZOLE SODIUM 40 MG PO TBEC: 40.0000 mg | DELAYED_RELEASE_TABLET | Freq: Every day | ORAL | 1 refills | Status: DC

## 2020-05-02 MED ORDER — EPINEPHRINE 0.3 MG/0.3ML IJ SOAJ: 0.3000 mg | INTRAMUSCULAR | 0 refills | Status: DC | PRN

## 2020-05-02 MED ORDER — ALBUTEROL SULFATE HFA 108 (90 BASE) MCG/ACT IN AERS: 2.0000 | INHALATION_SPRAY | Freq: Four times a day (QID) | RESPIRATORY_TRACT | 1 refills | Status: DC | PRN

## 2020-05-02 MED ORDER — BREZTRI AEROSPHERE 160-9-4.8 MCG/ACT IN AERO: 2.0000 | INHALATION_SPRAY | Freq: Two times a day (BID) | RESPIRATORY_TRACT | 3 refills | Status: DC

## 2020-05-02 NOTE — Progress Notes (Signed)
[place] [address] Dept: [phone]  FOLLOW UP NOTE  Patient ID: Maria Nelson, female    DOB: Sep 09, 1955  Age: 65 y.o. MRN: [id number] Date of Office Visit: 05/02/2020  Assessment  Chief Complaint: COPD (Sometimes has shortness of breath and becomes easily tired from walking long or short distances. /Smoke also affects her negatively causes SOB /Daily activities take her a long time to complete because she feels like she over excerts herself Ex. Making the bed )  HPI Maria Nelson is a 65 year old female who presents today for asthma/COPD overlap, chronic urticaria, allergic rhinitis, and gastroesophageal reflux disease.  She was last seen on August 10, 2019 by Gareth Morgan, FNP.  Since her last office visit she has been to the urgent care once for urinary tract infection otherwise she has no new diagnosis or hospitalizations.  Asthma/ COPD overlap disease is reported as not well controlled with Breztri 2 puffs twice a day with spacer and albuterol as needed.  She did not change back to [facility] and add on Spiriva as recommended in May.  She felt that Breztri opened her up more.  She reports in the beginning that Breztri bothered her mouth, but it no longer does.  She reports occasional dry cough, shortness of breath with exertion and shortness of breath with making sudden moves.  She denies any wheezing, tightness in her chest, and nocturnal awakenings due to breathing problems.  Since her last office visit she has not required any systemic steroids or made any trips to the emergency room or urgent care due to breathing problems. Near Christmas she was out of [place] for approximately a week and a half.  During that time she was having to use her albuterol via nebulizer and HFA.  She would dose it like she would her Breztri.  She notices that smelling fumes from wood-burning fireplace causes her to feel short of breath.  She has had both Pfizer  COVID-19 vaccines and is due for her booster.  Chronic urticaria is reported as controlled with cetirizine 10 mg once a day.  She has not had any hives since her last office visit.  Allergic rhinitis is reported as moderately controlled with cetirizine 10 mg once a day.  She is not using azelastine nasal spray or saline nasal rinses.  She reports occasional nasal congestion and postnasal drip.  She denies any rhinorrhea.   gastroesophageal reflux disease is reported as controlled with pantoprazole 40 mg once a day.   Drug Allergies:  Allergies  Allergen Reactions  . Bee Venom Anaphylaxis  . Aspirin Nausea Only  . Other Rash    EKG pads cause burning sensation as soon as applied    Review of Systems: Review of Systems  Constitutional: Negative for chills and fever.  HENT:       Reports post nasal drip and occasional nasal congestion. She denies rhinorrhea  Eyes:       Denies itchy watery eyes  Respiratory: Positive for cough and shortness of breath. Negative for wheezing.        Reports occasional dry cough and shortness of breath with exertion. Denies wheezing and tightness in chest.  Cardiovascular: Negative for chest pain and palpitations.  Gastrointestinal: Negative for abdominal pain and heartburn.  Genitourinary: Negative for dysuria.  Skin: Negative for itching and rash.  Neurological: Negative for headaches.  Endo/Heme/Allergies: Positive for environmental allergies.    Physical Exam: BP 124/78 (BP Location: Right Arm, Patient Position: Sitting,  Cuff Size: Large)   Pulse 71   Temp (!) 97.5 F (36.4 C)   Resp 18   Ht 5\' 2"  (1.575 m)   Wt 259 lb 3.2 oz (117.6 kg)   LMP 04/19/2004 (Approximate)   SpO2 92%   BMI 47.41 kg/m    Physical Exam Constitutional:      Appearance: Normal appearance.  HENT:     Head: Normocephalic and atraumatic.     Comments: Pharynx normal. Eyes normal.Ears normal. Nose normal    Right Ear: Tympanic membrane, ear canal and external  ear normal.     Left Ear: Tympanic membrane, ear canal and external ear normal.     Nose: Nose normal.     Mouth/Throat:     Mouth: Mucous membranes are moist.     Pharynx: Oropharynx is clear.  Eyes:     Conjunctiva/sclera: Conjunctivae normal.  Cardiovascular:     Rate and Rhythm: Normal rate and regular rhythm.     Heart sounds: Normal heart sounds.  Pulmonary:     Effort: Pulmonary effort is normal.     Breath sounds: Normal breath sounds.     Comments: Lungs clear to auscultation Musculoskeletal:     Cervical back: Neck supple.  Skin:    General: Skin is warm.  Neurological:     Mental Status: She is alert and oriented to person, place, and time.  Psychiatric:        Mood and Affect: Mood normal.        Behavior: Behavior normal.        Thought Content: Thought content normal.        Judgment: Judgment normal.    Diagnostics: FVC 1.99 L, FEV1 1.25 L (69%), FEV1 ratio 0.63.  Predicted FVC 2.31 L, FEV1 1.80 L.  Spirometry indicates mild airway obstruction.  Status post bronchodilator response shows FVC 2.01 L, FEV1 1.30 L (72%), FEV1 ratio 0.65 .  Spirometry indicates mild airway obstruction with a 4% change in FEV1.  Assessment and Plan: 1. Asthma with COPD   2. Allergic rhinitis with nonallergic component   3. Urticaria   4. Angioedema, subsequent encounter   5. History of 2019 novel coronavirus disease (COVID-19)     No orders of the defined types were placed in this encounter.   Patient Instructions  Asthma/COPD Continue Breztri 2 puffs twice a day with spacer to help prevent cough and wheeze Continue albuterol 2 puffs every 4 hours as needed for cough or wheeze OR albuterol via nebulizer 1 unit dose every 4-6 hours as needed for cough, wheeze, tightness in chest, or shortness of breath. You may use albuterol 2 puffs 5-15 minutes before activity We will get some lab work to follow up on your asthma (cbc with diff, allergen zone 2, and IgE. We will call you with  lab results.  Chronic urticaria Continuine cetirizine 10 mg once a day as needed for hives. You may take an additional cetirizine 10 mg for breakthrough hives or itching.   Allergic rhinitis Start azelastine 2 sprays twice a day as needed for a runny nose Continue cetirizine 10 mg once a day as needed for a runny nose as listed above Consider saline nasal rinses as needed for nasal symptoms. Use this before any medicated nasal sprays for best result  Reflux Continue Pantoprazole 40 mg once a day as previously prescribed.  Continue dietary and lifestyle modifications as listed below  Call the clinic if this treatment plan is not working well for  you   Schedule a follow up appointment in 4 weeks      Return in about 4 weeks (around 05/30/2020), or if symptoms worsen or fail to improve.    Thank you for the opportunity to care for this patient.  Please do not hesitate to contact me with questions.  Althea Charon, FNP [facility] [place]

## 2020-05-06 ENCOUNTER — Other Ambulatory Visit: Payer: Self-pay | Admitting: *Deleted

## 2020-05-09 ENCOUNTER — Encounter: Payer: Self-pay | Admitting: Family

## 2020-05-09 LAB — ALLERGENS, ZONE 2
Alternaria Alternata IgE: 0.53 kU/L — AB
Amer Sycamore IgE Qn: 0.14 kU/L — AB
Aspergillus Fumigatus IgE: 0.44 kU/L — AB
Bahia Grass IgE: 0.5 kU/L — AB
Bermuda Grass IgE: <0.1 kU/L
Cat Dander IgE: 1.09 kU/L — AB
Cedar, Mountain IgE: 0.13 kU/L — AB
Cladosporium Herbarum IgE: 0.97 kU/L — AB
Cockroach, American IgE: <0.1 kU/L
Common Silver Birch IgE: <0.1 kU/L
D Farinae IgE: 0.13 kU/L — AB
D Pteronyssinus IgE: 0.13 kU/L — AB
Dog Dander IgE: 0.55 kU/L — AB
Elm, American IgE: 0.16 kU/L — AB
Hickory, White IgE: 0.55 kU/L — AB
Johnson Grass IgE: 0.33 kU/L — AB
Maple/Box Elder IgE: 1.45 kU/L — AB
Mucor Racemosus IgE: 0.12 kU/L — AB
Mugwort IgE Qn: 0.1 kU/L — AB
Nettle IgE: 0.2 kU/L — AB
Oak, White IgE: 0.16 kU/L — AB
Penicillium Chrysogen IgE: 0.24 kU/L — AB
Pigweed, Rough IgE: <0.1 kU/L
Plantain, English IgE: 0.13 kU/L — AB
Ragweed, Short IgE: 0.16 kU/L — AB
Sheep Sorrel IgE Qn: 0.15 kU/L — AB
Stemphylium Herbarum IgE: 1.19 kU/L — AB
Sweet gum IgE RAST Ql: <0.1 kU/L
Timothy Grass IgE: 0.6 kU/L — AB
White Mulberry IgE: <0.1 kU/L

## 2020-05-09 LAB — CBC WITH DIFFERENTIAL
Basophils Absolute: 0.1 10*3/uL (ref 0.0–0.2)
Basos: 1 %
EOS (ABSOLUTE): 0.4 10*3/uL (ref 0.0–0.4)
Eos: 5 %
Hematocrit: 45 % (ref 34.0–46.6)
Hemoglobin: 14.2 g/dL (ref 11.1–15.9)
Immature Grans (Abs): 0 10*3/uL (ref 0.0–0.1)
Immature Granulocytes: 0 %
Lymphocytes Absolute: 3.3 10*3/uL — ABNORMAL HIGH (ref 0.7–3.1)
Lymphs: 39 %
MCH: 24.8 pg — ABNORMAL LOW (ref 26.6–33.0)
MCHC: 31.6 g/dL (ref 31.5–35.7)
MCV: 79 fL (ref 79–97)
Monocytes Absolute: 0.6 10*3/uL (ref 0.1–0.9)
Monocytes: 7 %
Neutrophils Absolute: 4.1 10*3/uL (ref 1.4–7.0)
Neutrophils: 48 %
RBC: 5.73 x10E6/uL — ABNORMAL HIGH (ref 3.77–5.28)
RDW: 13.7 % (ref 11.7–15.4)
WBC: 8.5 10*3/uL (ref 3.4–10.8)

## 2020-05-09 LAB — IGE: IgE (Immunoglobulin E), Serum: 1443 IU/mL — ABNORMAL HIGH (ref 6–495)

## 2020-05-09 NOTE — Progress Notes (Signed)
Discussed lab results with Dr. Bary Leriche. Called and spoke with the patient about her lab results. Discussed how her lab work shows that she is mildly allergic to dust mite, cat, dog, grasses, molds, trees, ragweed, and weeds. Also, discussed how her eosinophils were elevated making her a candidate for a biologic called Berna Bue to help with her asthma. Instructed her that Berna Bue could potentially help her asthma, but that it would not help her COPD component. Discussed how Berna Bue works by targeting eosinophils and helps decrease exacerbations and symptoms. If she was interested she would receive her first injection on day one,  then week 4, then week 8, and then every 8 weeks. She would like information sent to her so she can do some research. Brochures mailed. Instructed her to call back if she is interested in starting Fasenra injections or if she has any questions.

## 2020-05-19 ENCOUNTER — Emergency Department (HOSPITAL_COMMUNITY)
Admission: EM | Admit: 2020-05-19 | Discharge: 2020-05-20 | Disposition: A | Discharge status: Home / Self Care | Payer: Medicaid Other | Attending: Emergency Medicine | Admitting: Emergency Medicine

## 2020-05-19 ENCOUNTER — Other Ambulatory Visit: Payer: Self-pay

## 2020-05-19 ENCOUNTER — Emergency Department (HOSPITAL_COMMUNITY): Payer: Medicaid Other

## 2020-05-19 DIAGNOSIS — R079 Chest pain, unspecified: Secondary | ICD-10-CM | POA: Insufficient documentation

## 2020-05-19 DIAGNOSIS — Z5321 Procedure and treatment not carried out due to patient leaving prior to being seen by health care provider: Secondary | ICD-10-CM | POA: Insufficient documentation

## 2020-05-19 LAB — BASIC METABOLIC PANEL
Anion gap: 8 (ref 5–15)
BUN: 9 mg/dL (ref 8–23)
CO2: 24 mmol/L (ref 22–32)
Calcium: 9.6 mg/dL (ref 8.9–10.3)
Chloride: 106 mmol/L (ref 98–111)
Creatinine, Ser: 1.04 mg/dL — ABNORMAL HIGH (ref 0.44–1.00)
GFR, Estimated: >60 mL/min (ref >60)
Glucose, Bld: 91 mg/dL (ref 70–99)
Potassium: 4 mmol/L (ref 3.5–5.1)
Sodium: 138 mmol/L (ref 135–145)

## 2020-05-19 LAB — CBC
HCT: 43.6 % (ref 36.0–46.0)
Hemoglobin: 13.4 g/dL (ref 12.0–15.0)
MCH: 24.3 pg — ABNORMAL LOW (ref 26.0–34.0)
MCHC: 30.7 g/dL (ref 30.0–36.0)
MCV: 79.1 fL — ABNORMAL LOW (ref 80.0–100.0)
Platelets: 320 10*3/uL (ref 150–400)
RBC: 5.51 MIL/uL — ABNORMAL HIGH (ref 3.87–5.11)
RDW: 14.3 % (ref 11.5–15.5)
WBC: 8.5 10*3/uL (ref 4.0–10.5)
nRBC: 0 % (ref 0.0–0.2)

## 2020-05-19 LAB — TROPONIN I (HIGH SENSITIVITY)
Troponin I (High Sensitivity): 6 ng/L (ref <18)
Troponin I (High Sensitivity): 8 ng/L (ref <18)

## 2020-05-19 NOTE — ED Triage Notes (Signed)
Pt reports central chest pain onset while cleaning her bathroom today. Pain worse with inspiration. Denies n/v or shob.

## 2020-05-20 NOTE — ED Notes (Signed)
Pt called 3x no answer  

## 2020-05-29 ENCOUNTER — Other Ambulatory Visit: Payer: Self-pay | Admitting: Obstetrics and Gynecology

## 2020-05-29 ENCOUNTER — Other Ambulatory Visit: Payer: Self-pay

## 2020-05-29 NOTE — Patient Instructions (Signed)
Visit Information  [name] was given information about Medicaid Managed Care team care coordination services as a part of their [place] Medicaid benefit. Maria Nelson verbally consented to engagement with the [facility] Managed Care team.   For questions related to your [facility], please call: [phone] or visit the homepage here: [email or web address]  If you would like to schedule transportation through your [facility], please call the following number at least 2 days in advance of your appointment: [phone]  [name] - following are the goals we discussed in your visit today:  Goals Addressed            This Visit's Progress   . Symptom Exacerbation Prevented or Minimized       Evidence-based guidance:    Assess risk for exacerbation (flare-up); anticipate adjustments based on risk, age and ability to perform testing.   Testing may include:   forced expiratory volume in 1 second   fractional concentration of exhaled nitric oxide, if available, for children ages 1 to 5 years   peak expiratory flow monitoring, which may be used in children with severe asthma or impaired perception of airflow restriction   Encourage pulmonary rehabilitation, occupational or physical therapy for breathing retraining, respiratory muscle and physical training, especially when functional impairments are present.   Arrange early follow-up visit (within 1 to 2 weeks) after any symptom flare-up.   Assess comorbidity that may contribute to flare-up, such as rhinitis, gastroesophageal reflux, obesity, obstructive sleep apnea, depression or anxiety.   Assess and monitor both child and caregiver for signs/symptoms of psychosocial concerns, such as breath-anxiety cycle, depression or anxiety that may impact stability of symptoms.   Assess adherence,  inhaler technique, persistent trigger exposure, barriers to treatment plan and lifestyle changes.   Encourage adherence with treatment plan, even when symptoms are ?ocontrolled? or infrequent.   Identify economic resources, sociocultural beliefs, social factors and health literacy that may interfere with adherence.   Promote lifestyle changes, when needed, including regular physical activity based on tolerance, weight loss, healthy eating and stress management.   Promote infection precautions; include avoiding and minimizing exposure to infections, especially viruses that do not respond to antibiotic therapy.   Encourage prevention strategies, such as up-to-date immunizations, frequent handwashing, containment of coughs and sneezes, good personal hygiene, adequate fluids, nutrition and sleep/rest.   Consider referral to nurse or community health worker home visiting program for intensive support and education (disease management program).      Patient verbalizes understanding of instructions provided today.   The Managed Medicaid care management team will reach out to the patient again over the next 30 days.  The patient has been provided with contact information for the Managed Medicaid care management team and has been advised to call with any health related questions or concerns.   Aida Raider RN, BSN [place] Management Coordinator - Managed Medicaid High Risk [phone],  Following is a copy of your plan of care:  Patient Care Plan: Asthma (Adult)    Problem Identified: Disease Progression (Asthma)     Goal: Disease Progression Prevented or Minimized   Note:   Evidence-based guidance:   Assess symptom control by the frequency of daytime and nighttime asthma symptoms, reliever use and activity limitation at every encounter.   Note: Asthma control test, sputum eosinophil counts or similar may be added at intervals determined by team.    Identify comorbidity that  may exacerbate symptoms, such as gastroesophageal reflux disease, rhinitis, obesity, obstructive sleep apnea, depression or anxiety.   Assess and reinforce correct inhaler technique frequently.   Encourage development and acceptance of self-management (asthma action) plan.   Consider home pulse oximetry for patients with decreased perception of hypoxia.   Identify and assist patient to manage identified triggers, such as smoking, viruses, eczema, weather changes, emotional upsets, excessive exercise, obesity and gastroesophageal reflux, as well as environmental allergens, including    pollen, dust mites, mold, pets and smoke; consider occupational-exposure reduction versus elimination to avoid compromising employment status.   Prepare for use of pharmacologic therapy, such as quick reliever, long-term controller, antihistamine, oral steroid, monoclonal anti-immunoglobulin E, immunizations in a stepped approach, based on presenting signs/symptoms and    laboratory results; monitor side effects and expect periodic adjustments.   Prepare patient for laboratory and diagnostic exams based on risk and presentation.   Provide smoking cessation intervention using screening, brief intervention and referral for treatment.   Review medication use, including delivery method, dose, frequency; identify barriers to adherence, such as access to medication, adverse reaction or lack of skill.             Problem Identified: Symptom Exacerbation (Asthma)     Goal: Symptom Exacerbation Prevented or Minimized   Note:   Evidence-based guidance:   Assess risk for exacerbation (flare-up) by evaluating personal best forced expiratory volume in 1 second intervals; anticipate treatment adjustments based on risk and resources.    Encourage pulmonary rehabilitation, occupational or physical therapy for breathing retraining, respiratory muscle and physical training, especially when  functional impairments are present.   Arrange early follow-up visit (within 1 to 2 weeks) after any symptom flare-up.   Assess adherence, inhaler technique, persistent trigger exposure, barriers to treatment plan and lifestyle changes.   Assess and monitor for signs/symptoms of psychosocial concerns, such as shortness of breath-anxiety cycle that may impact stability of symptoms.   Encourage adherence with treatment plan, even when symptoms are ?ocontrolled? or infrequent.   Identify economic resources, sociocultural beliefs, social factors and health literacy that may interfere with adherence.   Establish a mutually agreed upon early intervention process and asthma action plan to communicate with primary care provider when signs/symptoms worsen.   Promote lifestyle changes when needed, including regular physical activity based on tolerance, weight loss, healthy eating and stress management.   Consider referral to nurse or community health worker or home-visiting program for intensive support and education (disease-management program).            Patient Care Plan: Asthma (Adult)    Problem Identified: Disease Progression (Asthma)     Long-Range Goal: Disease Progression Prevented or Minimized   Start Date: 04/28/2020  Expected End Date: 07/27/2020  Recent Progress: On track  Priority: Medium  Note:   CARE PLAN ENTRY Medicaid Managed Care (see longitudinal plan of care for additional care plan information)  Current Barriers:  [name] Medication procurement  Nurse Case Manager Clinical Goal(s):  [name] Over the next 30 days, patient will verbalize understanding of plan for medication procurement. . Over the next 30 days, patient will work with provider to address needs related to medication refills. . Over the next 30 days, patient will attend all scheduled medical appointments. [name] Update 05/29/20:  Patient states she has her medications,  no problems at this time.  Interventions:   . Inter-disciplinary care team collaboration (see longitudinal plan of care) . Evaluation of current treatment plan related to asthma  and patient's adherence to plan as established by provider. . Advised patient to contact provider's office for medication refills and appointment. . Provided education to patient re: medication procurement. . Reviewed medications with patient. . Discussed plans with patient for ongoing care management follow up and provided patient with direct contact information for care management team  Patient Self Care Activities:  . Patient will contact provider's office for appointment and medication refills. . Patient verbalizes understanding of plan to obtain medications, . Self administers medications as prescribed . Attends all scheduled provider appointments . Calls pharmacy for medication refills . Calls provider office for new concerns or questions . Patient will schedule appointment for flu vaccine and COVID booster. [name] Update 05/29/20: Patient has received both doses of Pfizer vaccine, needs booster.

## 2020-05-29 NOTE — Patient Outreach (Signed)
Medicaid Managed Care   Nurse Care Manager Note  05/29/2020 Name:  Maria Nelson MRN:  [id number] DOB:  [birth year]  Maria Nelson is an 65 y.o. year old female who is a primary patient of Maria Rob, MD.  The [facility] Managed Care Coordination team was consulted for assistance with:    chronic healthcare management needs.  [name] was given information about Medicaid Managed Care Coordination team services today. [name] agreed to services and verbal consent obtained.  Engaged with patient by telephone for follow up visit in response to provider referral for case management and/or care coordination services.   Assessments/Interventions:  Review of past medical history, allergies, medications, health status, including review of consultants reports, laboratory and other test data, was performed as part of comprehensive evaluation and provision of chronic care management services.  SDOH (Social Determinants of Health) assessments and interventions performed:   Care Plan  Allergies  Allergen Reactions  . Bee Venom Anaphylaxis  . Aspirin Nausea Only  . Other Rash    EKG pads cause burning sensation as soon as applied    Medications Reviewed Today    Reviewed by Gayla Medicus, RN (Registered Nurse) on 05/29/20 at 1053  Med List Status: <None>  Medication Order Taking? Sig Documenting Provider Last Dose Status Informant  albuterol (PROVENTIL) (2.5 MG/3ML) 0.083% nebulizer solution [id number] Yes Use 1 vial in nebulizer up to 3 times a day when albuterol inhaler not working. If no better or getting worse, call 911 or physician line Bobbitt, [place] Muta, MD Taking Active Self  albuterol (VENTOLIN HFA) 108 (90 Base) MCG/ACT inhaler [id number] Yes Inhale 2 puffs into the lungs every 6 (six) hours as needed for wheezing. Althea Charon, FNP Taking Active   Budeson-Glycopyrrol-Formoterol (BREZTRI AEROSPHERE) 160-9-4.8 MCG/ACT Hollie Salk  [id number] Yes Inhale 2 puffs into the lungs 2 (two) times daily. Althea Charon, FNP Taking Active   cephALEXin (KEFLEX) 500 MG capsule [id number] No Take 1 capsule (500 mg total) by mouth 2 (two) times daily.  Patient not taking: No sig reported   Vanessa Kick, MD Not Taking Active   cetirizine (ZYRTEC) 10 MG tablet [id number] Yes Take 1 tablet (10 mg total) by mouth daily. Althea Charon, FNP Taking Active   EPINEPHrine (EPIPEN 2-PAK) 0.3 mg/0.3 mL IJ SOAJ injection [id number] Yes Inject 0.3 mg into the muscle as needed (for allergic reaction). Althea Charon, FNP Taking Active   ondansetron (ZOFRAN ODT) 4 MG disintegrating tablet [id number] Yes Take 1 tablet (4 mg total) by mouth every 8 (eight) hours as needed for nausea. Larene Pickett, PA-C Taking Active   pantoprazole (PROTONIX) 40 MG tablet [id number] Yes Take 1 tablet (40 mg total) by mouth daily. Althea Charon, FNP Taking Active           Patient Active Problem List   Diagnosis Date Noted  . Hyperlipidemia 11/22/2019  . UTI (urinary tract infection) 11/22/2019  . History of 2019 novel coronavirus disease (COVID-19) 05/11/2019  . COVID-19 04/30/2019  . Adnexal pain 09/06/2018  . SOB (shortness of breath) 05/26/2018  . Rash 05/11/2018  . Paroxysmal atrial fibrillation ([place]) 08/24/2017  . Dyspepsia 08/24/2017  . Knee pain, left 08/16/2016  . Back pain 06/23/2016  . Urticaria 07/01/2015  . 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 Allergic rhinitis with nonallergic component 03/04/2015  . COPD (chronic obstructive pulmonary disease) ([place]) 06/19/2014  . Blood pressure elevated without history of HTN 03/05/2014  . HNP (herniated nucleus pulposus), lumbar 04/16/2013  . Tenosynovitis,  de Quervain 11/15/2012  . Morbid obesity with BMI of 45.0-49.9, adult ([place]) 09/08/2012  . Carpal tunnel syndrome 06/23/2011  . Sleep apnea 09/02/2010  . Preventative health care 07/28/2010  . GERD 12/24/2009  . Urticaria/angioedema 12/16/2009  . STRESS  INCONTINENCE 01/24/2007  . History of peptic ulcer disease 07/26/2006    Conditions to be addressed/monitored per PCP order:  asthma/COPD overlap, chronic urticaria, allergic rhinitis, and gastroesophageal reflux disease.   Care Plan : Asthma (Adult)  Updates made by Gayla Medicus, RN since 05/29/2020 12:00 AM    Problem: Disease Progression (Asthma)     Long-Range Goal: Disease Progression Prevented or Minimized   Start Date: 04/28/2020  Expected End Date: 07/27/2020  Recent Progress: On track  Priority: Medium  Note:   CARE PLAN ENTRY Medicaid Managed Care (see longitudinal plan of care for additional care plan information)  Current Barriers:  [name] Medication procurement  Nurse Case Manager Clinical Goal(s):  [name] Over the next 30 days, patient will verbalize understanding of plan for medication procurement. . Over the next 30 days, patient will work with provider to address needs related to medication refills. . Over the next 30 days, patient will attend all scheduled medical appointments. [name] Update 05/29/20:  Patient states she has her medications,  no problems at this time.  Interventions:  . Inter-disciplinary care team collaboration (see longitudinal plan of care) . Evaluation of current treatment plan related to asthma and patient's adherence to plan as established by provider. . Advised patient to contact provider's office for medication refills and appointment. . Provided education to patient re: medication procurement. . Reviewed medications with patient. . Discussed plans with patient for ongoing care management follow up and provided patient with direct contact information for care management team  Patient Self Care Activities:  . Patient will contact provider's office for appointment and medication refills. . Patient verbalizes understanding of plan to obtain medications, . Self administers medications as prescribed . Attends all scheduled provider appointments . Calls  pharmacy for medication refills . Calls provider office for new concerns or questions . Patient will schedule appointment for flu vaccine and COVID booster. [name] Update 05/29/20: Patient has received both doses of Pfizer vaccine, needs booster.  Evidence-based guidance:   Assess symptom control by the frequency of daytime and nighttime asthma symptoms, reliever use and activity limitation at every encounter.   Note: Asthma control test, sputum eosinophil counts or similar may be added at intervals determined by team.   Identify comorbidity that may exacerbate symptoms, such as gastroesophageal reflux disease, rhinitis, obesity, obstructive sleep apnea, depression or anxiety.   Assess and reinforce correct inhaler technique frequently.   Encourage development and acceptance of self-management (asthma action) plan.   Consider home pulse oximetry for patients with decreased perception of hypoxia.   Identify and assist patient to manage identified triggers, such as smoking, viruses, eczema, weather changes, emotional upsets, excessive exercise, obesity and gastroesophageal reflux, as well as environmental allergens, including    pollen, dust mites, mold, pets and smoke; consider occupational-exposure reduction versus elimination to avoid compromising employment status.   Prepare for use of pharmacologic therapy, such as quick reliever, long-term controller, antihistamine, oral steroid, monoclonal anti-immunoglobulin E, immunizations in a stepped approach, based on presenting signs/symptoms and    laboratory results; monitor side effects and expect periodic adjustments.   Prepare patient for laboratory and diagnostic exams based on risk and presentation.   Provide smoking cessation intervention using screening, brief intervention and referral for treatment.  Review medication use, including delivery method, dose, frequency; identify barriers to adherence, such as access to medication, adverse  reaction or lack of skill.       Follow Up:  Patient agrees to Care Plan and Follow-up.  Plan: The Managed Medicaid care management team will reach out to the patient again over the next 30 days. and The patient has been provided with contact information for the Managed Medicaid care management team and has been advised to call with any health related questions or concerns.  Date/time of next scheduled RN care management/care coordination outreach:  06/26/20 at 0900.

## 2020-06-01 NOTE — Patient Instructions (Addendum)
Asthma/COPD Continue Breztri 2 puffs twice a day with spacer to help prevent cough and wheeze Continue albuterol 2 puffs every 4 hours as needed for cough or wheeze OR albuterol via nebulizer 1 unit dose every 4-6 hours as needed for cough, wheeze, tightness in chest, or shortness of breath. You may use albuterol 2 puffs 5-15 minutes before activity Recommend starting Fasenra injections. Continue to consider this and please call us if you have any questions.  Chronic urticaria-stable Continuine cetirizine 10 mg once a day as needed for hives. You may take an additional cetirizine 10 mg for breakthrough hives or itching.   Allergic rhinitis Continue azelastine 2 sprays twice a day as needed for a runny nose Continue cetirizine 10 mg once a day as needed for a runny nose as listed above Consider saline nasal rinses as needed for nasal symptoms. Use this before any medicated nasal sprays for best result  Reflux Continue Pantoprazole 40 mg once a day as previously prescribed.  Continue dietary and lifestyle modifications as listed below  Call the clinic if this treatment plan is not working well for you   Schedule a follow up appointment in 2 months or sooner if needed

## 2020-06-02 ENCOUNTER — Encounter: Payer: Self-pay | Admitting: Family

## 2020-06-02 ENCOUNTER — Ambulatory Visit (INDEPENDENT_AMBULATORY_CARE_PROVIDER_SITE_OTHER): Payer: Medicaid Other | Admitting: Family

## 2020-06-02 ENCOUNTER — Other Ambulatory Visit: Payer: Self-pay

## 2020-06-02 VITALS — BP 120/78 | HR 82 | Temp 97.5°F | Resp 20

## 2020-06-02 DIAGNOSIS — J3089 Other allergic rhinitis: Secondary | ICD-10-CM

## 2020-06-02 DIAGNOSIS — T783XXD Angioneurotic edema, subsequent encounter: Secondary | ICD-10-CM

## 2020-06-02 DIAGNOSIS — Z8616 Personal history of COVID-19: Secondary | ICD-10-CM

## 2020-06-02 DIAGNOSIS — J449 Chronic obstructive pulmonary disease, unspecified: Secondary | ICD-10-CM

## 2020-06-02 DIAGNOSIS — L501 Idiopathic urticaria: Secondary | ICD-10-CM | POA: Diagnosis not present

## 2020-06-02 MED ORDER — ALBUTEROL SULFATE HFA 108 (90 BASE) MCG/ACT IN AERS: 2.0000 | INHALATION_SPRAY | RESPIRATORY_TRACT | 1 refills | Status: DC | PRN

## 2020-06-02 NOTE — Progress Notes (Signed)
[facility]  [facility] [place] [address] Dept: [phone]  FOLLOW UP NOTE  Patient ID: Maria Nelson, female    DOB: 1955/04/24  Age: 65 y.o. MRN: [id number] Date of Office Visit: 06/02/2020  Assessment  Chief Complaint: Asthma and COPD  HPI Maria Nelson 65 year old female who presents today for follow-up on asthma with COPD, allergic rhinitis with nonallergic component, urticaria, angioedema, and history of 2019 novel coronavirus disease.  She was last seen on May 02, 2020 by Althea Charon, FNP.  Asthma with COPD is reported as moderately controlled with Breztri 2 puffs twice a day with a spacer and albuterol HFA or via nebulizer as needed.  She reports shortness of breath with exertion and denies coughing, wheezing, and tightness in her chest.  Since her last office visit she has not required any systemic steroids.  She uses her albuterol approximately 1-2 times a day before walking, but there are some days that she does not use it at all.  Since her last office visit she did go to the emergency room on May 19, 2020 after she felt like he had overexerted herself while cleaning her bathroom.  She got tired and her chest felt tight.  Her son took her on to the emergency room.  She reports that she waited there for 7 hours and was told it was going to be an additional 7 hours before she was seen, so she left.  While she was there she did have a chest x-ray that showed:" chronic bronchial thickening and bibasilar atelectasis or scarring.  No acute findings."  At this time she does not want to go through with Fasenra injections.  She would like to think some more on it. Offered to answer any questions she had.  Allergic rhinitis is reported as controlled with cetirizine 10 mg once a day.  She has not picked up her prescription of azelastine from the pharmacy that was prescribed at the last office visit.  She also has not needed to use saline nasal  rinses.  Reflux is reported as controlled with pantoprazole 40 mg once a day.  Since her last office visit she denies any angioedema or urticaria.  She reports she is currently taking cetirizine 10 mg once a day.   Drug Allergies:  Allergies  Allergen Reactions  . Bee Venom Anaphylaxis  . Aspirin Nausea Only  . Other Rash    EKG pads cause burning sensation as soon as applied    Review of Systems: Review of Systems  Constitutional: Negative for chills and fever.  HENT:       Denies rhinorrhea, nasal congestion, and postnasal drip  Eyes:       Denies itchy watery eyes  Respiratory: Positive for shortness of breath. Negative for cough and wheezing.        Reports shortness of breath with exertion  Cardiovascular: Negative for chest pain and palpitations.  Gastrointestinal: Negative for abdominal pain and heartburn.  Genitourinary: Negative for dysuria.  Skin: Negative for itching and rash.  Neurological: Negative for headaches.  Endo/Heme/Allergies: Positive for environmental allergies.    Physical Exam: BP 120/78 (BP Location: Left Arm, Patient Position: Sitting, Cuff Size: Normal)   Pulse 82   Temp (!) 97.5 F (36.4 C) (Temporal)   Resp 20   LMP 04/19/2004 (Approximate)   SpO2 97%    Physical Exam Constitutional:      Appearance: Normal appearance.  HENT:     Head: Normocephalic and atraumatic.  Comments: Pharynx normal, eyes normal, ears normal, nose: Bilateral lower turbinates mildly edematous and slightly erythematous with no drainage noted    Right Ear: Tympanic membrane, ear canal and external ear normal.     Left Ear: Tympanic membrane, ear canal and external ear normal.     Mouth/Throat:     Mouth: Mucous membranes are moist.     Pharynx: Oropharynx is clear.  Eyes:     Conjunctiva/sclera: Conjunctivae normal.  Cardiovascular:     Rate and Rhythm: Regular rhythm.     Heart sounds: Normal heart sounds.  Pulmonary:     Effort: Pulmonary effort is  normal.     Breath sounds: Normal breath sounds.     Comments: Lungs clear to auscultation Musculoskeletal:     Cervical back: Neck supple.  Skin:    General: Skin is warm.     Comments: No rashes or urticarial lesions noted  Neurological:     Mental Status: She is alert and oriented to person, place, and time.  Psychiatric:        Mood and Affect: Mood normal.        Behavior: Behavior normal.        Thought Content: Thought content normal.        Judgment: Judgment normal.     Diagnostics: None  Assessment and Plan: 1. Asthma with COPD   2. Allergic rhinitis with nonallergic component   3. Urticaria   4. Angioedema, subsequent encounter   5. History of 2019 novel coronavirus disease (COVID-19)     No orders of the defined types were placed in this encounter.   Patient Instructions  Asthma/COPD Continue Breztri 2 puffs twice a day with spacer to help prevent cough and wheeze Continue albuterol 2 puffs every 4 hours as needed for cough or wheeze OR albuterol via nebulizer 1 unit dose every 4-6 hours as needed for cough, wheeze, tightness in chest, or shortness of breath. You may use albuterol 2 puffs 5-15 minutes before activity Recommend starting Fasenra injections. Continue to consider this and please call us if you have any questions.  Chronic urticaria-stable Continuine cetirizine 10 mg once a day as needed for hives. You may take an additional cetirizine 10 mg for breakthrough hives or itching.   Allergic rhinitis Continue azelastine 2 sprays twice a day as needed for a runny nose Continue cetirizine 10 mg once a day as needed for a runny nose as listed above Consider saline nasal rinses as needed for nasal symptoms. Use this before any medicated nasal sprays for best result  Reflux Continue Pantoprazole 40 mg once a day as previously prescribed.  Continue dietary and lifestyle modifications as listed below  Call the clinic if this treatment plan is not working  well for you   Schedule a follow up appointment in 2 months or sooner if needed      Return in about 2 months (around 07/31/2020), or if symptoms worsen or fail to improve.    Thank you for the opportunity to care for this patient.  Please do not hesitate to contact me with questions.  Althea Charon, FNP [facility]

## 2020-06-03 ENCOUNTER — Other Ambulatory Visit: Payer: Self-pay

## 2020-06-03 MED ORDER — ALBUTEROL SULFATE HFA 108 (90 BASE) MCG/ACT IN AERS
2.0000 | INHALATION_SPRAY | RESPIRATORY_TRACT | 3 refills | Status: DC | PRN
Start: 2020-06-03 — End: 2020-08-15

## 2020-06-03 MED ORDER — ALBUTEROL SULFATE HFA 108 (90 BASE) MCG/ACT IN AERS
2.0000 | INHALATION_SPRAY | RESPIRATORY_TRACT | 3 refills | Status: DC | PRN
Start: 2020-06-03 — End: 2020-06-06

## 2020-06-05 ENCOUNTER — Telehealth: Payer: Self-pay

## 2020-06-05 NOTE — Telephone Encounter (Signed)
PA for albuterol initiated through [email or web address] pending approval

## 2020-06-06 ENCOUNTER — Other Ambulatory Visit: Payer: Self-pay | Admitting: *Deleted

## 2020-06-06 MED ORDER — PROAIR HFA 108 (90 BASE) MCG/ACT IN AERS: 2.0000 | INHALATION_SPRAY | RESPIRATORY_TRACT | 3 refills | Status: DC | PRN

## 2020-06-06 NOTE — Telephone Encounter (Signed)
PA was denied for Albuterol stating that Proair HFA was preferred. A new prescriptino for name brand Proair has been sent in.

## 2020-06-26 ENCOUNTER — Other Ambulatory Visit: Payer: Self-pay | Admitting: Obstetrics and Gynecology

## 2020-06-26 ENCOUNTER — Other Ambulatory Visit: Payer: Self-pay

## 2020-06-26 NOTE — Patient Outreach (Signed)
Medicaid Managed Care   Nurse Care Manager Note  06/26/2020 Name:  Maria Nelson MRN:  [id number] DOB:  11-25-1955  Maria Nelson is an 65 y.o. year old female who is a primary patient of Maria Rob, Nelson.  The [facility] Managed Care Coordination team was consulted for assistance with:    chronic healthcare management needs  Maria Nelson was given information about Medicaid Managed Care Coordination team services today. Maria Nelson agreed to services and verbal consent obtained.  Engaged with patient by telephone for follow up visit in response to provider referral for case management and/or care coordination services.   Assessments/Interventions:  Review of past medical history, allergies, medications, health status, including review of consultants reports, laboratory and other test data, was performed as part of comprehensive evaluation and provision of chronic care management services.  SDOH (Social Determinants of Health) assessments and interventions performed:   Care Plan  Allergies  Allergen Reactions  . Bee Venom Anaphylaxis  . Aspirin Nausea Only  . Other Rash    EKG pads cause burning sensation as soon as applied    Medications Reviewed Today    Reviewed by Maria Medicus, RN (Registered Nurse) on 06/26/20 at [place] List Status: <None>  Medication Order Taking? Sig Documenting Provider Last Dose Status Informant  albuterol (PROAIR HFA) 108 (90 Base) MCG/ACT inhaler [id number] Yes Inhale 2 puffs into the lungs every 4 (four) hours as needed for wheezing or shortness of breath. Maria Charon, FNP Taking Active   albuterol (PROVENTIL) (2.5 MG/3ML) 0.083% nebulizer solution [id number] Yes Use 1 vial in nebulizer up to 3 times a day when albuterol inhaler not working. If no better or getting worse, call 911 or physician line Nelson, Maria Muta, Nelson Taking Active Self  albuterol (VENTOLIN HFA) 108 (90 Base) MCG/ACT inhaler  [id number] Yes Inhale 2 puffs into the lungs every 4 (four) hours as needed for wheezing. Maria Charon, FNP Taking Active   Budeson-Glycopyrrol-Formoterol (BREZTRI AEROSPHERE) 160-9-4.8 MCG/ACT Maria Nelson [id number] Yes Inhale 2 puffs into the lungs 2 (two) times daily. Maria Charon, FNP Taking Active   cephALEXin (KEFLEX) 500 MG capsule [id number] No Take 1 capsule (500 mg total) by mouth 2 (two) times daily.  Patient not taking: Reported on 06/26/2020   Maria Nelson Not Taking Active   cetirizine (ZYRTEC) 10 MG tablet [id number] Yes Take 1 tablet (10 mg total) by mouth daily. Maria Charon, FNP Taking Active   EPINEPHrine (EPIPEN 2-PAK) 0.3 mg/0.3 mL IJ SOAJ injection [id number] Yes Inject 0.3 mg into the muscle as needed (for allergic reaction). Maria Charon, FNP Taking Active   ondansetron (ZOFRAN ODT) 4 MG disintegrating tablet [id number] Yes Take 1 tablet (4 mg total) by mouth every 8 (eight) hours as needed for nausea. Maria Pickett, PA-C Taking Active   pantoprazole (PROTONIX) 40 MG tablet [id number] Yes Take 1 tablet (40 mg total) by mouth daily. Maria Charon, FNP Taking Active   PROAIR HFA 108 [phone] Base) MCG/ACT inhaler [id number] Yes Inhale 2 puffs into the lungs every 4 (four) hours as needed for wheezing or shortness of breath. Maria Charon, FNP Taking Active           Patient Active Problem List   Diagnosis Date Noted  . Hyperlipidemia 11/22/2019  . UTI (urinary tract infection) 11/22/2019  . History of 2019 novel coronavirus disease (COVID-19) 05/11/2019  . COVID-19 04/30/2019  . Adnexal pain 09/06/2018  . SOB (shortness of breath) 05/26/2018  . Rash 05/11/2018  .  Paroxysmal atrial fibrillation (Maria Nelson) 08/24/2017  . Dyspepsia 08/24/2017  . Knee pain, left 08/16/2016  . Back pain 06/23/2016  . Urticaria 07/01/2015  . 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 Allergic rhinitis with nonallergic component 03/04/2015  . COPD (chronic obstructive pulmonary disease) ([place]) 06/19/2014   . Blood pressure elevated without history of HTN 03/05/2014  . HNP (herniated nucleus pulposus), lumbar 04/16/2013  . Tenosynovitis, de Quervain 11/15/2012  . Morbid obesity with BMI of 45.0-49.9, adult ([place]) 09/08/2012  . Carpal tunnel syndrome 06/23/2011  . Sleep apnea 09/02/2010  . Preventative health care 07/28/2010  . GERD 12/24/2009  . Urticaria/angioedema 12/16/2009  . STRESS INCONTINENCE 01/24/2007  . History of peptic ulcer disease 07/26/2006    Conditions to be addressed/monitored per PCP order:  chronic healthcare management needs-asthma, COPD.  Care Plan : Asthma (Adult)  Updates made by Maria Medicus, RN since 06/26/2020 12:00 AM    Problem: Disease Progression (Asthma)     Long-Range Goal: Disease Progression Prevented or Minimized   Start Date: 04/28/2020  Expected End Date: 07/27/2020  Recent Progress: On track  Priority: Medium  Note:   CARE PLAN ENTRY Medicaid Managed Care (see longitudinal plan of care for additional care plan information)  Current Barriers:  [name] Medication procurement  Nurse Case Manager Clinical Goal(s):  [name] Over the next 30 days, patient will verbalize understanding of plan for medication procurement. . Over the next 30 days, patient will work with provider to address needs related to medication refills. . Over the next 30 days, patient will attend all scheduled medical appointments. [name] Update 05/29/20:  Patient states she has her medications,  no problems at this time.  Interventions:  . Inter-disciplinary care team collaboration (see longitudinal plan of care) . Evaluation of current treatment plan related to asthma and patient's adherence to plan as established by provider. . Advised patient to contact provider's office for medication refills and appointment. . Provided education to patient re: medication procurement. . Reviewed medications with patient. . Discussed plans with patient for ongoing care management follow up and provided  patient with direct contact information for care management team . Update 06/26/20:  Patient with no complaints-doing well.  Plans on scheduling appointment with PCP-phone number given.  Patient Self Care Activities:  . Patient will contact provider's office for appointment and medication refills. . Patient verbalizes understanding of plan to obtain medications, . Self administers medications as prescribed . Attends all scheduled provider appointments . Calls pharmacy for medication refills . Calls provider office for new concerns or questions . Patient will schedule appointment for flu vaccine and COVID booster. [name] Update 05/29/20: Patient has received both doses of Pfizer vaccine, needs booster.  Evidence-based guidance:   Assess symptom control by the frequency of daytime and nighttime asthma symptoms, reliever use and activity limitation at every encounter.   Note: Asthma control test, sputum eosinophil counts or similar may be added at intervals determined by team.   Identify comorbidity that may exacerbate symptoms, such as gastroesophageal reflux disease, rhinitis, obesity, obstructive sleep apnea, depression or anxiety.   Assess and reinforce correct inhaler technique frequently.   Encourage development and acceptance of self-management (asthma action) plan.   Consider home pulse oximetry for patients with decreased perception of hypoxia.   Identify and assist patient to manage identified triggers, such as smoking, viruses, eczema, weather changes, emotional upsets, excessive exercise, obesity and gastroesophageal reflux, as well as environmental allergens, including    pollen, dust mites, mold, pets and smoke;  consider occupational-exposure reduction versus elimination to avoid compromising employment status.   Prepare for use of pharmacologic therapy, such as quick reliever, long-term controller, antihistamine, oral steroid, monoclonal anti-immunoglobulin E, immunizations in a  stepped approach, based on presenting signs/symptoms and    laboratory results; monitor side effects and expect periodic adjustments.   Prepare patient for laboratory and diagnostic exams based on risk and presentation.   Provide smoking cessation intervention using screening, brief intervention and referral for treatment.   Review medication use, including delivery method, dose, frequency; identify barriers to adherence, such as access to medication, adverse reaction or lack of skill.      Problem: Symptom Exacerbation (Asthma)     Goal: Symptom Exacerbation Prevented or Minimized   Note:   Evidence-based guidance:   Assess risk for exacerbation (flare-up) by evaluating personal best forced expiratory volume in 1 second intervals; anticipate treatment adjustments based on risk and resources.    Encourage pulmonary rehabilitation, occupational or physical therapy for breathing retraining, respiratory muscle and physical training, especially when functional impairments are present.   Arrange early follow-up visit (within 1 to 2 weeks) after any symptom flare-up.   Assess adherence, inhaler technique, persistent trigger exposure, barriers to treatment plan and lifestyle changes.   Assess and monitor for signs/symptoms of psychosocial concerns, such as shortness of breath-anxiety cycle that may impact stability of symptoms.   Encourage adherence with treatment plan, even when symptoms are ?ocontrolled? or infrequent.   Identify economic resources, sociocultural beliefs, social factors and health literacy that may interfere with adherence.   Establish a mutually agreed upon early intervention process and asthma action plan to communicate with primary care provider when signs/symptoms worsen.   Promote lifestyle changes when needed, including regular physical activity based on tolerance, weight loss, healthy eating and stress management.   Consider referral to nurse or community  health worker or home-visiting program for intensive support and education (disease-management program).      Follow Up:  Patient agrees to Care Plan and Follow-up.  Plan: The Managed Medicaid care management team will reach out to the patient again over the next 30 days. and The patient has been provided with contact information for the Managed Medicaid care management team and has been advised to call with any health related questions or concerns.  Date/time of next scheduled RN care management/care coordination outreach:  07/21/20 at 0900.

## 2020-06-26 NOTE — Patient Instructions (Signed)
Visit Information  Maria Nelson was given information about Medicaid Managed Care team care coordination services as a part of their [place] Medicaid benefit. Maria Nelson verbally consented to engagement with the [facility] Managed Care team.   For questions related to your [facility], please call: [phone] or visit the homepage here: [email or web address]  If you would like to schedule transportation through your [facility], please call the following number at least 2 days in advance of your appointment: [phone].   Maria Nelson - following are the goals we discussed in your visit today:  Goals Addressed            This Visit's Progress   . Symptom Exacerbation Prevented or Minimized       Update 06/26/20:  Patient states she is doing well, no problems.  [facility] as directed.  Patient plans on scheduling appointment with her PCP as well-phone number given.  Evidence-based guidance:    Assess risk for exacerbation (flare-up); anticipate adjustments based on risk, age and ability to perform testing.   Testing may include:   forced expiratory volume in 1 second   fractional concentration of exhaled nitric oxide, if available, for children ages 1 to 5 years   peak expiratory flow monitoring, which may be used in children with severe asthma or impaired perception of airflow restriction   Encourage pulmonary rehabilitation, occupational or physical therapy for breathing retraining, respiratory muscle and physical training, especially when functional impairments are present.   Arrange early follow-up visit (within 1 to 2 weeks) after any symptom flare-up.   Assess comorbidity that may contribute to flare-up, such as rhinitis, gastroesophageal reflux, obesity, obstructive sleep apnea, depression  or anxiety.   Assess and monitor both child and caregiver for signs/symptoms of psychosocial concerns, such as breath-anxiety cycle, depression or anxiety that may impact stability of symptoms.   Assess adherence, inhaler technique, persistent trigger exposure, barriers to treatment plan and lifestyle changes.   Encourage adherence with treatment plan, even when symptoms are ?ocontrolled? or infrequent.   Identify economic resources, sociocultural beliefs, social factors and health literacy that may interfere with adherence.   Promote lifestyle changes, when needed, including regular physical activity based on tolerance, weight loss, healthy eating and stress management.   Promote infection precautions; include avoiding and minimizing exposure to infections, especially viruses that do not respond to antibiotic therapy.   Encourage prevention strategies, such as up-to-date immunizations, frequent handwashing, containment of coughs and sneezes, good personal hygiene, adequate fluids, nutrition and sleep/rest.   Consider referral to nurse or community health worker home visiting program for intensive support and education (disease management program    Patient verbalizes understanding of instructions provided today.   The Managed Medicaid care management team will reach out to the patient again over the next 30 days.  The patient has been provided with contact information for the Managed Medicaid care management team and has been advised to call with any health related questions or concerns.   Aida Raider RN, BSN [place]  [facility] - Managed Medicaid High Risk [phone].  Following is a copy of your plan of care:      Patient Care Plan: Asthma (Adult)    Problem Identified: Disease Progression (Asthma)     Long-Range Goal: Disease Progression Prevented or Minimized   Start Date: 04/28/2020  Expected End Date: 07/27/2020  Recent  Progress: On  track  Priority: Medium  Note:   CARE PLAN ENTRY Medicaid Managed Care (see longitudinal plan of care for additional care plan information)  Current Barriers:  [name] Medication procurement  Nurse Case Manager Clinical Goal(s):  [name] Over the next 30 days, patient will verbalize understanding of plan for medication procurement. . Over the next 30 days, patient will work with provider to address needs related to medication refills. . Over the next 30 days, patient will attend all scheduled medical appointments. [name] Update 05/29/20:  Patient states she has her medications,  no problems at this time.  Interventions:  . Inter-disciplinary care team collaboration (see longitudinal plan of care) . Evaluation of current treatment plan related to asthma and patient's adherence to plan as established by provider. . Advised patient to contact provider's office for medication refills and appointment. . Provided education to patient re: medication procurement. . Reviewed medications with patient. . Discussed plans with patient for ongoing care management follow up and provided patient with direct contact information for care management team . Update 06/26/20:  Patient with no complaints-doing well.  Plans on scheduling appointment with PCP-phone number given.  Patient Self Care Activities:  . Patient will contact provider's office for appointment and medication refills. . Patient verbalizes understanding of plan to obtain medications, . Self administers medications as prescribed . Attends all scheduled provider appointments . Calls pharmacy for medication refills . Calls provider office for new concerns or questions . Patient will schedule appointment for flu vaccine and COVID booster. [name] Update 05/29/20: Patient has received both doses of Pfizer vaccine, needs booster.  Evidence-based guidance:   Assess symptom control by the frequency of daytime and nighttime asthma symptoms, reliever use  and activity limitation at every encounter.   Note: Asthma control test, sputum eosinophil counts or similar may be added at intervals determined by team.   Identify comorbidity that may exacerbate symptoms, such as gastroesophageal reflux disease, rhinitis, obesity, obstructive sleep apnea, depression or anxiety.   Assess and reinforce correct inhaler technique frequently.   Encourage development and acceptance of self-management (asthma action) plan.   Consider home pulse oximetry for patients with decreased perception of hypoxia.   Identify and assist patient to manage identified triggers, such as smoking, viruses, eczema, weather changes, emotional upsets, excessive exercise, obesity and gastroesophageal reflux, as well as environmental allergens, including    pollen, dust mites, mold, pets and smoke; consider occupational-exposure reduction versus elimination to avoid compromising employment status.   Prepare for use of pharmacologic therapy, such as quick reliever, long-term controller, antihistamine, oral steroid, monoclonal anti-immunoglobulin E, immunizations in a stepped approach, based on presenting signs/symptoms and    laboratory results; monitor side effects and expect periodic adjustments.   Prepare patient for laboratory and diagnostic exams based on risk and presentation.   Provide smoking cessation intervention using screening, brief intervention and referral for treatment.   Review medication use, including delivery method, dose, frequency; identify barriers to adherence, such as access to medication, adverse reaction or lack of skill.            Problem Identified: Symptom Exacerbation (Asthma)     Goal: Symptom Exacerbation Prevented or Minimized   Start Date: 04/28/2020  Expected End Date: 07/27/2020  This Visit's Progress: On track  Priority: Medium  Note:     Establish a mutually agreed upon early intervention process and asthma action plan to communicate  with primary care provider when signs/symptoms worsen.   Promote lifestyle changes when needed, including regular  physical activity based on tolerance, weight loss, healthy eating and stress management.   Consider referral to nurse or community health worker or home-visiting program for intensive support and education (disease-management program).

## 2020-07-21 ENCOUNTER — Other Ambulatory Visit: Payer: Self-pay

## 2020-07-21 ENCOUNTER — Other Ambulatory Visit: Payer: Self-pay | Admitting: Obstetrics and Gynecology

## 2020-07-21 NOTE — Patient Outreach (Signed)
Medicaid Managed Care   Nurse Care Manager Note  07/21/2020 Name:  Maria Nelson MRN:  [id number] DOB:  1955/12/19  Maria Nelson is an 65 y.o. year old female who is a primary patient of Maria Rob, MD.  The [facility] Managed Care Coordination team was consulted for assistance with:    chronic healthcare management needs.  Maria Nelson was given information about Medicaid Managed Care Coordination team services today. Maria Nelson agreed to services and verbal consent obtained.  Engaged with patient by telephone for follow up visit in response to provider referral for case management and/or care coordination services.   Assessments/Interventions:  Review of past medical history, allergies, medications, health status, including review of consultants reports, laboratory and other test data, was performed as part of comprehensive evaluation and provision of chronic care management services.  SDOH (Social Determinants of Health) assessments and interventions performed:   Care Plan  Allergies  Allergen Reactions  . Bee Venom Anaphylaxis  . Aspirin Nausea Only  . Other Rash    EKG pads cause burning sensation as soon as applied    Medications Reviewed Today    Reviewed by Maria Medicus, RN (Registered Nurse) on 07/21/20 at [place] Status: <None>  Medication Order Taking? Sig Documenting Provider Last Dose Status Informant  albuterol (PROAIR HFA) 108 (90 Base) MCG/ACT inhaler [id number] Yes Inhale 2 puffs into the lungs every 4 (four) hours as needed for wheezing or shortness of breath. Maria Charon, FNP Taking Active   albuterol (PROVENTIL) (2.5 MG/3ML) 0.083% nebulizer solution [id number] Yes Use 1 vial in nebulizer up to 3 times a day when albuterol inhaler not working. If no better or getting worse, call 911 or physician line Nelson, Maria Muta, MD Taking Active Self  albuterol (VENTOLIN HFA) 108 (90 Base) MCG/ACT inhaler  [id number] Yes Inhale 2 puffs into the lungs every 4 (four) hours as needed for wheezing. Maria Charon, FNP Taking Active   Budeson-Glycopyrrol-Formoterol (BREZTRI AEROSPHERE) 160-9-4.8 MCG/ACT Maria Nelson [id number] Yes Inhale 2 puffs into the lungs 2 (two) times daily. Maria Charon, FNP Taking Active   cephALEXin (KEFLEX) 500 MG capsule [id number] No Take 1 capsule (500 mg total) by mouth 2 (two) times daily.  Patient not taking: No sig reported   Maria Kick, MD Not Taking Active   cetirizine (ZYRTEC) 10 MG tablet [id number] Yes Take 1 tablet (10 mg total) by mouth daily. Maria Charon, FNP Taking Active   EPINEPHrine (EPIPEN 2-PAK) 0.3 mg/0.3 mL IJ SOAJ injection [id number] Yes Inject 0.3 mg into the muscle as needed (for allergic reaction). Maria Charon, FNP Taking Active   ondansetron (ZOFRAN ODT) 4 MG disintegrating tablet [id number] Yes Take 1 tablet (4 mg total) by mouth every 8 (eight) hours as needed for nausea. Maria Pickett, PA-C Taking Active   pantoprazole (PROTONIX) 40 MG tablet [id number] Yes Take 1 tablet (40 mg total) by mouth daily. Maria Charon, FNP Taking Active   PROAIR HFA 108 [phone] Base) MCG/ACT inhaler [id number] Yes Inhale 2 puffs into the lungs every 4 (four) hours as needed for wheezing or shortness of breath. Maria Charon, FNP Taking Active           Patient Active Problem List   Diagnosis Date Noted  . Hyperlipidemia 11/22/2019  . UTI (urinary tract infection) 11/22/2019  . History of 2019 novel coronavirus disease (COVID-19) 05/11/2019  . COVID-19 04/30/2019  . Adnexal pain 09/06/2018  . SOB (shortness of breath) 05/26/2018  . Rash 05/11/2018  .  Paroxysmal atrial fibrillation ([place]) 08/24/2017  . Dyspepsia 08/24/2017  . Knee pain, left 08/16/2016  . Back pain 06/23/2016  . Urticaria 07/01/2015  . 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 Allergic rhinitis with nonallergic component 03/04/2015  . COPD (chronic obstructive pulmonary disease) ([place]) 06/19/2014  .  Blood pressure elevated without history of HTN 03/05/2014  . HNP (herniated nucleus pulposus), lumbar 04/16/2013  . Tenosynovitis, de Quervain 11/15/2012  . Morbid obesity with BMI of 45.0-49.9, adult ([place]) 09/08/2012  . Carpal tunnel syndrome 06/23/2011  . Sleep apnea 09/02/2010  . Preventative health care 07/28/2010  . GERD 12/24/2009  . Urticaria/angioedema 12/16/2009  . STRESS INCONTINENCE 01/24/2007  . History of peptic ulcer disease 07/26/2006    Conditions to be addressed/monitored per PCP order:  chronic healthcare managemnt needs, asthma, COPD, OSA, GERD.  Care Plan : Asthma (Adult)  Updates made by Maria Medicus, RN since 07/21/2020 12:00 AM    Problem: Disease Progression (Asthma)     Long-Range Goal: Disease Progression Prevented or Minimized   Start Date: 07/21/2020  Expected End Date: 10/20/2020  This Visit's Progress: On track  Priority: Medium  Note:   Evidence-based guidance:   Assess symptom control by the frequency of daytime and nighttime asthma symptoms, reliever use and activity limitation at every encounter.   Note: Asthma control test, sputum eosinophil counts or similar may be added at intervals determined by team.   Identify comorbidity that may exacerbate symptoms, such as gastroesophageal reflux disease, rhinitis, obesity, obstructive sleep apnea, depression or anxiety.   Assess and reinforce correct inhaler technique frequently.   Encourage development and acceptance of self-management (asthma action) plan.   Consider home pulse oximetry for patients with decreased perception of hypoxia.   Identify and assist patient to manage identified triggers, such as smoking, viruses, eczema, weather changes, emotional upsets, excessive exercise, obesity and gastroesophageal reflux, as well as environmental allergens, including    pollen, dust mites, mold, pets and smoke; consider occupational-exposure reduction versus elimination to avoid compromising employment  status.   Prepare for use of pharmacologic therapy, such as quick reliever, long-term controller, antihistamine, oral steroid, monoclonal anti-immunoglobulin E, immunizations in a stepped approach, based on presenting signs/symptoms and    laboratory results; monitor side effects and expect periodic adjustments.   Prepare patient for laboratory and diagnostic exams based on risk and presentation.   Provide smoking cessation intervention using screening, brief intervention and referral for treatment.   Review medication use, including delivery method, dose, frequency; identify barriers to adherence, such as access to medication, adverse reaction or lack of skill.      Problem: Symptom Exacerbation (Asthma)     Long-Range Goal: Symptom Exacerbation Prevented or Minimized   Start Date: 07/21/2020  Expected End Date: 10/20/2020  This Visit's Progress: On track  Priority: Medium  Note:   Evidence-based guidance:   Assess risk for exacerbation (flare-up) by evaluating personal best forced expiratory volume in 1 second intervals; anticipate treatment adjustments based on risk and resources.    Encourage pulmonary rehabilitation, occupational or physical therapy for breathing retraining, respiratory muscle and physical training, especially when functional impairments are present.   Arrange early follow-up visit (within 1 to 2 weeks) after any symptom flare-up.   Assess adherence, inhaler technique, persistent trigger exposure, barriers to treatment plan and lifestyle changes.   Assess and monitor for signs/symptoms of psychosocial concerns, such as shortness of breath-anxiety cycle that may impact stability of symptoms.   Encourage adherence with treatment plan, even when  symptoms are ?ocontrolled? or infrequent.   Identify economic resources, sociocultural beliefs, social factors and health literacy that may interfere with adherence.   Establish a mutually agreed upon early intervention  process and asthma action plan to communicate with primary care provider when signs/symptoms worsen.   Promote lifestyle changes when needed, including regular physical activity based on tolerance, weight loss, healthy eating and stress management.   Consider referral to nurse or community health worker or home-visiting program for intensive support and education (disease-management program).      Care Plan : Asthma (Adult)  Updates made by Maria Medicus, RN since 07/21/2020 12:00 AM    Problem: Disease Progression (Asthma)     Long-Range Goal: Disease Progression Prevented or Minimized   Start Date: 04/28/2020  Expected End Date: 10/20/2020  Recent Progress: On track  Priority: Medium  Note:   CARE PLAN ENTRY Medicaid Managed Care (see longitudinal plan of care for additional care plan information)  Current Barriers:  [name] Medication procurement  Nurse Case Manager Clinical Goal(s):  [name] Over the next 30 days, patient will verbalize understanding of plan for medication procurement. . Over the next 30 days, patient will work with provider to address needs related to medication refills. . Over the next 30 days, patient will attend all scheduled medical appointments. [name] Update 05/29/20:  Patient states she has her medications,  no problems at this time.  Interventions:  . Inter-disciplinary care team collaboration (see longitudinal plan of care) . Evaluation of current treatment plan related to asthma and patient's adherence to plan as established by provider. . Advised patient to contact provider's office for medication refills and appointment. . Provided education to patient re: medication procurement. . Reviewed medications with patient. . Discussed plans with patient for ongoing care management follow up and provided patient with direct contact information for care management team . Update 06/26/20:  Patient with no complaints-doing well.  Plans on scheduling appointment with PCP-phone  number given.  Patient Self Care Activities:  . Patient will contact provider's office for appointment and medication refills. . Patient verbalizes understanding of plan to obtain medications, . Self administers medications as prescribed . Attends all scheduled provider appointments . Calls pharmacy for medication refills . Calls provider office for new concerns or questions . Patient will schedule appointment for flu vaccine and COVID booster. [name] Update 05/29/20: Patient has received both doses of Pfizer vaccine, needs booster. [name] Update 07/21/20:  COVID booster received.      Follow Up:  Patient agrees to Care Plan and Follow-up.  Plan: The Managed Medicaid care management team will reach out to the patient again over the next 30 days. and The patient has been provided with contact information for the Managed Medicaid care management team and has been advised to call with any health related questions or concerns.  Date/time of next scheduled RN care management/care coordination outreach: 08/20/20 at 0900.

## 2020-07-21 NOTE — Patient Instructions (Signed)
Visit Information  Ms. Looney was given information about Medicaid Managed Care team care coordination services as a part of their [place] Medicaid benefit. Avyanna Spada verbally consented to engagement with the [facility] Managed Care team.   For questions related to your [facility], please call: [phone] or visit the homepage here: [email or web address]  If you would like to schedule transportation through your [facility], please call the following number at least 2 days in advance of your appointment: [phone].   Call the [place] at [phone], at any time, 24 hours a day, 7 days a week. If you are in danger or need immediate medical attention call 911.  Ms. Bowe - following are the goals we discussed in your visit today:  Goals Addressed            This Visit's Progress   . Symptom Exacerbation Prevented or Minimized       Update 06/26/20:  Patient states she is doing well, no problems.  Attending appointments at [facility] [place] as directed.  Patient plans on scheduling appointment with her PCP as well-phone number given. Update 07/21/20:  Patient without complaint today-states will schedule appointment with her PCP.  Evidence-based guidance:    Assess risk for exacerbation (flare-up); anticipate adjustments based on risk, age and ability to perform testing.   Testing may include:   forced expiratory volume in 1 second   fractional concentration of exhaled nitric oxide, if available, for children ages 1 to 5 years   peak expiratory flow monitoring, which may be used in children with severe asthma or impaired perception of airflow restriction   Encourage pulmonary rehabilitation, occupational or physical therapy for breathing retraining, respiratory muscle and physical training, especially  when functional impairments are present.   Arrange early follow-up visit (within 1 to 2 weeks) after any symptom flare-up.   Assess comorbidity that may contribute to flare-up, such as rhinitis, gastroesophageal reflux, obesity, obstructive sleep apnea, depression or anxiety.   Assess and monitor both child and caregiver for signs/symptoms of psychosocial concerns, such as breath-anxiety cycle, depression or anxiety that may impact stability of symptoms.   Assess adherence, inhaler technique, persistent trigger exposure, barriers to treatment plan and lifestyle changes.   Encourage adherence with treatment plan, even when symptoms are ?ocontrolled? or infrequent.   Identify economic resources, sociocultural beliefs, social factors and health literacy that may interfere with adherence.   Promote lifestyle changes, when needed, including regular physical activity based on tolerance, weight loss, healthy eating and stress management.   Promote infection precautions; include avoiding and minimizing exposure to infections, especially viruses that do not respond to antibiotic therapy.   Encourage prevention strategies, such as up-to-date immunizations, frequent handwashing, containment of coughs and sneezes, good personal hygiene, adequate fluids, nutrition and sleep/rest.   Consider referral to nurse or community health worker home visiting program for intensive support and education (disease management program).     Patient verbalizes understanding of instructions provided today.   The Managed Medicaid care management team will reach out to the patient again over the next 30 days.  The patient has been provided with contact information for the Managed Medicaid care management team and has been advised to call with any health related questions or concerns.   Aida Raider RN, BSN [place]  [facility] - Managed Medicaid High  Risk [phone].  Following is a copy of your  plan of care:    Patient Care Plan: Asthma (Adult)    Problem Identified: Disease Progression (Asthma)     Long-Range Goal: Disease Progression Prevented or Minimized   Start Date: 04/28/2020  Expected End Date: 10/20/2020  Recent Progress: On track  Priority: Medium  Note:   CARE PLAN ENTRY Medicaid Managed Care (see longitudinal plan of care for additional care plan information)  Current Barriers:  [name] Medication procurement  Nurse Case Manager Clinical Goal(s):  [name] Over the next 30 days, patient will verbalize understanding of plan for medication procurement. . Over the next 30 days, patient will work with provider to address needs related to medication refills. . Over the next 30 days, patient will attend all scheduled medical appointments. [name] Update 05/29/20:  Patient states she has her medications,  no problems at this time.  Interventions:  . Inter-disciplinary care team collaboration (see longitudinal plan of care) . Evaluation of current treatment plan related to asthma and patient's adherence to plan as established by provider. . Advised patient to contact provider's office for medication refills and appointment. . Provided education to patient re: medication procurement. . Reviewed medications with patient. . Discussed plans with patient for ongoing care management follow up and provided patient with direct contact information for care management team . Update 06/26/20:  Patient with no complaints-doing well.  Plans on scheduling appointment with PCP-phone number given.  Patient Self Care Activities:  . Patient will contact provider's office for appointment and medication refills. . Patient verbalizes understanding of plan to obtain medications, . Self administers medications as prescribed . Attends all scheduled provider appointments . Calls pharmacy for medication refills . Calls provider office for new concerns or  questions . Patient will schedule appointment for flu vaccine and COVID booster. [name] Update 05/29/20: Patient has received both doses of Pfizer vaccine, needs booster. [name] Update 07/21/20:  COVID booster received.  Evidence-based guidance:   Assess symptom control by the frequency of daytime and nighttime asthma symptoms, reliever use and activity limitation at every encounter.   Note: Asthma control test, sputum eosinophil counts or similar may be added at intervals determined by team.   Identify comorbidity that may exacerbate symptoms, such as gastroesophageal reflux disease, rhinitis, obesity, obstructive sleep apnea, depression or anxiety.   Assess and reinforce correct inhaler technique frequently.   Encourage development and acceptance of self-management (asthma action) plan.   Consider home pulse oximetry for patients with decreased perception of hypoxia.   Identify and assist patient to manage identified triggers, such as smoking, viruses, eczema, weather changes, emotional upsets, excessive exercise, obesity and gastroesophageal reflux, as well as environmental allergens, including    pollen, dust mites, mold, pets and smoke; consider occupational-exposure reduction versus elimination to avoid compromising employment status.   Prepare for use of pharmacologic therapy, such as quick reliever, long-term controller, antihistamine, oral steroid, monoclonal anti-immunoglobulin E, immunizations in a stepped approach, based on presenting signs/symptoms and    laboratory results; monitor side effects and expect periodic adjustments.   Prepare patient for laboratory and diagnostic exams based on risk and presentation.   Provide smoking cessation intervention using screening, brief intervention and referral for treatment.   Review medication use, including delivery method, dose, frequency; identify barriers to adherence, such as access to medication, adverse reaction or lack of skill.

## 2020-08-15 ENCOUNTER — Encounter: Payer: Self-pay | Admitting: Allergy

## 2020-08-15 ENCOUNTER — Other Ambulatory Visit: Payer: Self-pay

## 2020-08-15 ENCOUNTER — Ambulatory Visit (INDEPENDENT_AMBULATORY_CARE_PROVIDER_SITE_OTHER): Payer: Medicare Other | Admitting: Allergy

## 2020-08-15 VITALS — BP 110/78 | HR 66 | Resp 16

## 2020-08-15 DIAGNOSIS — K219 Gastro-esophageal reflux disease without esophagitis: Secondary | ICD-10-CM | POA: Diagnosis not present

## 2020-08-15 DIAGNOSIS — J449 Chronic obstructive pulmonary disease, unspecified: Secondary | ICD-10-CM | POA: Diagnosis not present

## 2020-08-15 DIAGNOSIS — L501 Idiopathic urticaria: Secondary | ICD-10-CM | POA: Diagnosis not present

## 2020-08-15 DIAGNOSIS — J3089 Other allergic rhinitis: Secondary | ICD-10-CM | POA: Diagnosis not present

## 2020-08-15 NOTE — Progress Notes (Signed)
1    Follow-up Note  RE: Maria Nelson MRN: [id number] DOB: 1956/04/11 Date of Office Visit: 08/15/2020   History of present illness: Maria Nelson is a 65 y.o. female presenting today for follow-up of asthma with COPD overlap, chronic urticaria, allergic rhinitis and reflux.  She was last seen in the office on 06/02/2020 by our nurse practitioner Quita Skye.  She states she has been doing well.  She states she has been taking care of herself.  She denies any major health changes, surgeries or hospitalizations since the last visit. She states her breathing has been doing well.  She states the only time she gets symptomatic with shortness of breath is when she overexerts herself.  She states at that time she will just sit down and relax a bit and the shortness of breath does subside.  She states she has not needed to use her rescue inhaler since her last visit.  She continues to use Breztri 2 puffs twice a day.  She denies any nighttime awakenings and has not required any ED or urgent care visits or any systemic steroid needs She states she has not had any hive episodes of the last visit.  She does continue to take cetirizine once a day. She has not had much increase in allergy symptoms.  She denies nasal congestion or drainage at this time.  But she has not needed to use azelastine nasal spray. She states she does continue her Protonix daily for reflux control.     Review of systems: Review of Systems  Constitutional: Negative.   HENT: Negative.   Eyes: Negative.   Respiratory: Negative.   Cardiovascular: Negative.   Gastrointestinal: Negative.   Musculoskeletal: Negative.   Skin: Negative.   Neurological: Negative.     All other systems negative unless noted above in HPI  Past medical/social/surgical/family history have been reviewed and are unchanged unless specifically indicated below.  No changes  Medication List: Current Outpatient Medications   Medication Sig Dispense Refill  . albuterol (PROVENTIL) (2.5 MG/3ML) 0.083% nebulizer solution Use 1 vial in nebulizer up to 3 times a day when albuterol inhaler not working. If no better or getting worse, call 911 or physician line 75 mL 3  . Budeson-Glycopyrrol-Formoterol (BREZTRI AEROSPHERE) 160-9-4.8 MCG/ACT AERO Inhale 2 puffs into the lungs 2 (two) times daily. 11 g 3  . cetirizine (ZYRTEC) 10 MG tablet Take 1 tablet (10 mg total) by mouth daily. 90 tablet 1  . EPINEPHrine (EPIPEN 2-PAK) 0.3 mg/0.3 mL IJ SOAJ injection Inject 0.3 mg into the muscle as needed (for allergic reaction). 1 each 0  . ondansetron (ZOFRAN ODT) 4 MG disintegrating tablet Take 1 tablet (4 mg total) by mouth every 8 (eight) hours as needed for nausea. 10 tablet 0  . pantoprazole (PROTONIX) 40 MG tablet Take 1 tablet (40 mg total) by mouth daily. 90 tablet 1  . PROAIR HFA 108 (90 Base) MCG/ACT inhaler Inhale 2 puffs into the lungs every 4 (four) hours as needed for wheezing or shortness of breath. 18 g 3   No current facility-administered medications for this visit.     Known medication allergies: Allergies  Allergen Reactions  . Bee Venom Anaphylaxis  . Aspirin Nausea Only  . Other Rash    EKG pads cause burning sensation as soon as applied     Physical examination: Blood pressure 110/78, pulse 66, resp. rate 16, last menstrual period 04/19/2004, SpO2 92 %.  General: Alert, interactive, in no acute distress. HEENT:  PERRLA, TMs pearly gray, turbinates non-edematous without discharge, post-pharynx non erythematous. Neck: Supple without lymphadenopathy. Lungs: Clear to auscultation without wheezing, rhonchi or rales. {no increased work of breathing. CV: Normal S1, S2 without murmurs. Abdomen: Nondistended, nontender. Skin: Warm and dry, without lesions or rashes. Extremities:  No clubbing, cyanosis or edema. Neuro:   Grossly intact.  Diagnositics/Labs:  Spirometry: FEV1: 1.09 L 57%, FVC: 1.71 L 71%  predicted.  Consistent with obstructive with restrictive pattern  Assessment and plan:   Asthma/COPD Continue Breztri 2 puffs twice a day with spacer to help prevent cough and wheeze Continue albuterol 2 puffs every 4 hours as needed for cough or wheeze OR albuterol via nebulizer 1 unit dose every 4-6 hours as needed for cough, wheeze, tightness in chest, or shortness of breath. You may use albuterol 2 puffs 5-15 minutes before activity  Chronic urticaria-stable Continuine cetirizine 10 mg once a day as needed for hives. You may take an additional cetirizine 10 mg for breakthrough hives or itching.   Allergic rhinitis Use azelastine 2 sprays twice a day as needed for a runny nose Continue cetirizine 10 mg once a day as needed for a runny nose as listed above Consider saline nasal rinses as needed for nasal symptoms. Use this before any medicated nasal sprays for best result  Reflux Continue Pantoprazole 40 mg once a day as previously prescribed.  Continue dietary and lifestyle modifications as listed below  Call the clinic if this treatment plan is not working well for you   Schedule a follow up appointment in 4-6 months or sooner if needed   I appreciate the opportunity to take part in Maria Nelson's care. Please do not hesitate to contact me with questions.  Sincerely,   Prudy Feeler, MD Allergy/Immunology [facility]

## 2020-08-15 NOTE — Patient Instructions (Addendum)
Asthma/COPD Continue Breztri 2 puffs twice a day with spacer to help prevent cough and wheeze Continue albuterol 2 puffs every 4 hours as needed for cough or wheeze OR albuterol via nebulizer 1 unit dose every 4-6 hours as needed for cough, wheeze, tightness in chest, or shortness of breath. You may use albuterol 2 puffs 5-15 minutes before activity  Chronic urticaria-stable Continuine cetirizine 10 mg once a day as needed for hives. You may take an additional cetirizine 10 mg for breakthrough hives or itching.   Allergic rhinitis Use azelastine 2 sprays twice a day as needed for a runny nose Continue cetirizine 10 mg once a day as needed for a runny nose as listed above Consider saline nasal rinses as needed for nasal symptoms. Use this before any medicated nasal sprays for best result  Reflux Continue Pantoprazole 40 mg once a day as previously prescribed.  Continue dietary and lifestyle modifications as listed below  Call the clinic if this treatment plan is not working well for you   Schedule a follow up appointment in 4 months or sooner if needed

## 2020-08-20 ENCOUNTER — Other Ambulatory Visit: Payer: Self-pay | Admitting: Obstetrics and Gynecology

## 2020-08-20 NOTE — Patient Instructions (Signed)
Visit Information  Ms. Haag was given information about Medicaid Managed Care team care coordination services as a part of their [place] Medicaid benefit. Kyriaki Moder verbally consented to engagement with the [facility] Managed Care team.   For questions related to your [facility], please call: [phone] or visit the homepage here: [email or web address]  If you would like to schedule transportation through your [facility], please call the following number at least 2 days in advance of your appointment: [phone].   Call the [facility] at [phone], at any time, 24 hours a day, 7 days a week. If you are in danger or need immediate medical attention call 911.  Ms. Alig - following are the goals we discussed in your visit today:  Goals Addressed            This Visit's Progress   . Symptom Exacerbation Prevented or Minimized       Update 06/26/20:  Patient states she is doing well, no problems.  Attending appointments [facility] [place] as directed.  Patient plans on scheduling appointment with her PCP as well-phone number given. Update 07/21/20:  Patient without complaint today-states will schedule appointment with her PCP. Update 08/20/20:  Patient seen at [facility] 08/15/20-has not scheduled appointment with PCP yet.  Evidence-based guidance:    Assess risk for exacerbation (flare-up); anticipate adjustments based on risk, age and ability to perform testing.   Testing may include:   forced expiratory volume in 1 second   fractional concentration of exhaled nitric oxide, if available, for children ages 1 to 5 years   peak expiratory flow monitoring, which may be used in children with severe asthma or impaired perception of airflow restriction   Encourage pulmonary rehabilitation,  occupational or physical therapy for breathing retraining, respiratory muscle and physical training, especially when functional impairments are present.   Arrange early follow-up visit (within 1 to 2 weeks) after any symptom flare-up.   Assess comorbidity that may contribute to flare-up, such as rhinitis, gastroesophageal reflux, obesity, obstructive sleep apnea, depression or anxiety.   Assess and monitor both child and caregiver for signs/symptoms of psychosocial concerns, such as breath-anxiety cycle, depression or anxiety that may impact stability of symptoms.   Assess adherence, inhaler technique, persistent trigger exposure, barriers to treatment plan and lifestyle changes.   Encourage adherence with treatment plan, even when symptoms are ?ocontrolled? or infrequent.   Identify economic resources, sociocultural beliefs, social factors and health literacy that may interfere with adherence.   Promote lifestyle changes, when needed, including regular physical activity based on tolerance, weight loss, healthy eating and stress management.   Promote infection precautions; include avoiding and minimizing exposure to infections, especially viruses that do not respond to antibiotic therapy.   Encourage prevention strategies, such as up-to-date immunizations, frequent handwashing, containment of coughs and sneezes, good personal hygiene, adequate fluids, nutrition and sleep/rest.   Consider referral to nurse or community health worker home visiting program for intensive support and education (disease management program).      Patient verbalizes understanding of instructions provided today.   The Managed Medicaid care management team will reach out to the patient again over the next 30 days.  The patient has been provided with contact information for the Managed Medicaid care management team and has been advised to call with any health related questions or concerns.  Aida Raider RN,  BSN [employer]  Network Care Management Coordinator - Managed Medicaid High Risk [phone].  Following is a copy of your plan of care:    Patient Care Plan: Asthma (Adult)    Problem Identified: Disease Progression (Asthma)   Priority: Medium  Onset Date: 05/29/2020    Long-Range Goal: Disease Progression Prevented or Minimized   Start Date: 04/28/2020  Expected End Date: 10/20/2020  Recent Progress: On track  Priority: Medium  Note:   CARE PLAN ENTRY Medicaid Managed Care (see longitudinal plan of care for additional care plan information)  Current Barriers:  [name] Medication procurement  Nurse Case Manager Clinical Goal(s):  [name] Over the next 30 days, patient will verbalize understanding of plan for medication procurement. . Over the next 30 days, patient will work with provider to address needs related to medication refills. . Over the next 30 days, patient will attend all scheduled medical appointments. [name] Update 05/29/20:  Patient states she has her medications,  no problems at this time.  Interventions:  . Inter-disciplinary care team collaboration (see longitudinal plan of care) . Evaluation of current treatment plan related to asthma and patient's adherence to plan as established by provider. . Advised patient to contact provider's office for medication refills and appointment. . Provided education to patient re: medication procurement. . Reviewed medications with patient. . Discussed plans with patient for ongoing care management follow up and provided patient with direct contact information for care management team . Update 06/26/20:  Patient with no complaints-doing well.  Plans on scheduling appointment with PCP-phone number given. [name] Update 08/20/20:  Patient without complaint today-[facility]-needs to schedule PCP appointment.  Patient Self Care Activities:  . Patient will contact provider's office for appointment and  medication refills. . Patient verbalizes understanding of plan to obtain medications, . Self administers medications as prescribed . Attends all scheduled provider appointments . Calls pharmacy for medication refills . Calls provider office for new concerns or questions . Patient will schedule appointment for flu vaccine and COVID booster. [name] Update 05/29/20: Patient has received both doses of Pfizer vaccine, needs booster. [name] Update 07/21/20:  COVID booster received.   Evidence-based guidance:   Assess symptom control by the frequency of daytime and nighttime asthma symptoms, reliever use and activity limitation at every encounter.   Note: Asthma control test, sputum eosinophil counts or similar may be added at intervals determined by team.   Identify comorbidity that may exacerbate symptoms, such as gastroesophageal reflux disease, rhinitis, obesity, obstructive sleep apnea, depression or anxiety.   Assess and reinforce correct inhaler technique frequently.   Encourage development and acceptance of self-management (asthma action) plan.   Consider home pulse oximetry for patients with decreased perception of hypoxia.   Identify and assist patient to manage identified triggers, such as smoking, viruses, eczema, weather changes, emotional upsets, excessive exercise, obesity and gastroesophageal reflux, as well as environmental allergens, including    pollen, dust mites, mold, pets and smoke; consider occupational-exposure reduction versus elimination to avoid compromising employment status.   Prepare for use of pharmacologic therapy, such as quick reliever, long-term controller, antihistamine, oral steroid, monoclonal anti-immunoglobulin E, immunizations in a stepped approach, based on presenting signs/symptoms and    laboratory results; monitor side effects and expect periodic adjustments.   Prepare patient for laboratory and diagnostic exams based on risk and presentation.   Provide  smoking cessation intervention using screening, brief intervention and referral for treatment.   Review medication use, including delivery method, dose, frequency; identify barriers  to adherence, such as access to medication, adverse reaction or lack of skill.

## 2020-08-20 NOTE — Patient Outreach (Signed)
Medicaid Managed Care   Nurse Care Manager Note  08/20/2020 Name:  Maria Nelson MRN:  [id number] DOB:  July 20, 1955  Maria Nelson is an 65 y.o. year old female who is a primary patient of Maria Rob, MD.  The [facility] Managed Care Coordination team was consulted for assistance with:    chronic healthcare management needs.  Maria Nelson was given information about Medicaid Managed Care Coordination team services today. Maria Nelson agreed to services and verbal consent obtained.  Engaged with patient by telephone for follow up visit in response to provider referral for case management and/or care coordination services.   Assessments/Interventions:  Review of past medical history, allergies, medications, health status, including review of consultants reports, laboratory and other test data, was performed as part of comprehensive evaluation and provision of chronic care management services.  SDOH (Social Determinants of Health) assessments and interventions performed:   Care Plan  Allergies  Allergen Reactions  . Bee Venom Anaphylaxis  . Aspirin Nausea Only  . Other Rash    EKG pads cause burning sensation as soon as applied    Medications Reviewed Today    Reviewed by Gayla Medicus, RN (Registered Nurse) on 08/20/20 at [place] List Status: <None>  Medication Order Taking? Sig Documenting Provider Last Dose Status Informant  albuterol (PROVENTIL) (2.5 MG/3ML) 0.083% nebulizer solution [id number] No Use 1 vial in nebulizer up to 3 times a day when albuterol inhaler not working. If no better or getting worse, call 911 or physician line Bobbitt, [place] Muta, MD Taking Active Self  Budeson-Glycopyrrol-Formoterol (BREZTRI AEROSPHERE) 160-9-4.8 MCG/ACT AERO [id number] No Inhale 2 puffs into the lungs 2 (two) times daily. Althea Charon, FNP Taking Active   cetirizine (ZYRTEC) 10 MG tablet [id number] No Take 1 tablet (10 mg total) by mouth  daily. Althea Charon, FNP Taking Active   EPINEPHrine (EPIPEN 2-PAK) 0.3 mg/0.3 mL IJ SOAJ injection [id number] No Inject 0.3 mg into the muscle as needed (for allergic reaction). Althea Charon, FNP Taking Active   ondansetron (ZOFRAN ODT) 4 MG disintegrating tablet [id number] No Take 1 tablet (4 mg total) by mouth every 8 (eight) hours as needed for nausea. Larene Pickett, PA-C Taking Active   pantoprazole (PROTONIX) 40 MG tablet [id number] No Take 1 tablet (40 mg total) by mouth daily. Althea Charon, FNP Taking Active   PROAIR HFA 108 [phone] Base) MCG/ACT inhaler [id number] No Inhale 2 puffs into the lungs every 4 (four) hours as needed for wheezing or shortness of breath. Althea Charon, FNP Taking Active           Patient Active Problem List   Diagnosis Date Noted  . Hyperlipidemia 11/22/2019  . UTI (urinary tract infection) 11/22/2019  . History of 2019 novel coronavirus disease (COVID-19) 05/11/2019  . COVID-19 04/30/2019  . Adnexal pain 09/06/2018  . SOB (shortness of breath) 05/26/2018  . Rash 05/11/2018  . Paroxysmal atrial fibrillation ([place]) 08/24/2017  . Dyspepsia 08/24/2017  . Knee pain, left 08/16/2016  . Back pain 06/23/2016  . Urticaria 07/01/2015  . 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 Allergic rhinitis with nonallergic component 03/04/2015  . COPD (chronic obstructive pulmonary disease) ([place]) 06/19/2014  . Blood pressure elevated without history of HTN 03/05/2014  . HNP (herniated nucleus pulposus), lumbar 04/16/2013  . Tenosynovitis, de Quervain 11/15/2012  . Morbid obesity with BMI of 45.0-49.9, adult ([place]) 09/08/2012  . Carpal tunnel syndrome 06/23/2011  . Sleep apnea 09/02/2010  . Preventative health care 07/28/2010  .  GERD 12/24/2009  . Urticaria/angioedema 12/16/2009  . STRESS INCONTINENCE 01/24/2007  . History of peptic ulcer disease 07/26/2006    Conditions to be addressed/monitored per PCP order:  asthma with COPD overlap, chronic urticaria, allergic  rhinitis and reflux.    Care Plan : Asthma (Adult)  Updates made by Gayla Medicus, RN since 08/20/2020 12:00 AM    Problem: Disease Progression (Asthma)   Priority: Medium  Onset Date: 05/29/2020    Problem: Symptom Exacerbation (Asthma)   Priority: Medium  Onset Date: 05/29/2020    Care Plan : Asthma (Adult)  Updates made by Gayla Medicus, RN since 08/20/2020 12:00 AM    Problem: Disease Progression (Asthma)   Priority: Medium  Onset Date: 05/29/2020    Long-Range Goal: Disease Progression Prevented or Minimized   Start Date: 04/28/2020  Expected End Date: 10/20/2020  Recent Progress: On track  Priority: Medium  Note:   CARE PLAN ENTRY Medicaid Managed Care (see longitudinal plan of care for additional care plan information)  Current Barriers:  [name] Medication procurement  Nurse Case Manager Clinical Goal(s):  [name] Over the next 30 days, patient will verbalize understanding of plan for medication procurement. . Over the next 30 days, patient will work with provider to address needs related to medication refills. . Over the next 30 days, patient will attend all scheduled medical appointments. [name] Update 05/29/20:  Patient states she has her medications,  no problems at this time.  Interventions:  . Inter-disciplinary care team collaboration (see longitudinal plan of care) . Evaluation of current treatment plan related to asthma and patient's adherence to plan as established by provider. . Advised patient to contact provider's office for medication refills and appointment. . Provided education to patient re: medication procurement. . Reviewed medications with patient. . Discussed plans with patient for ongoing care management follow up and provided patient with direct contact information for care management team . Update 06/26/20:  Patient with no complaints-doing well.  Plans on scheduling appointment with PCP-phone number given. [name] Update 08/20/20:  Patient without complaint today-[facility]-needs to schedule PCP appointment.  Patient Self Care Activities:  . Patient will contact provider's office for appointment and medication refills. . Patient verbalizes understanding of plan to obtain medications, . Self administers medications as prescribed . Attends all scheduled provider appointments . Calls pharmacy for medication refills . Calls provider office for new concerns or questions . Patient will schedule appointment for flu vaccine and COVID booster. [name] Update 05/29/20: Patient has received both doses of Pfizer vaccine, needs booster. [name] Update 07/21/20:  COVID booster received.   Evidence-based guidance:   Assess symptom control by the frequency of daytime and nighttime asthma symptoms, reliever use and activity limitation at every encounter.   Note: Asthma control test, sputum eosinophil counts or similar may be added at intervals determined by team.   Identify comorbidity that may exacerbate symptoms, such as gastroesophageal reflux disease, rhinitis, obesity, obstructive sleep apnea, depression or anxiety.   Assess and reinforce correct inhaler technique frequently.   Encourage development and acceptance of self-management (asthma action) plan.   Consider home pulse oximetry for patients with decreased perception of hypoxia.   Identify and assist patient to manage identified triggers, such as smoking, viruses, eczema, weather changes, emotional upsets, excessive exercise, obesity and gastroesophageal reflux, as well as environmental allergens, including    pollen, dust mites, mold, pets and smoke; consider occupational-exposure reduction versus elimination to avoid compromising employment status.  Prepare for use of pharmacologic therapy, such as quick reliever, long-term controller, antihistamine, oral steroid, monoclonal anti-immunoglobulin E, immunizations in a stepped approach, based on presenting signs/symptoms and    laboratory  results; monitor side effects and expect periodic adjustments.   Prepare patient for laboratory and diagnostic exams based on risk and presentation.   Provide smoking cessation intervention using screening, brief intervention and referral for treatment.   Review medication use, including delivery method, dose, frequency; identify barriers to adherence, such as access to medication, adverse reaction or lack of skill.       Follow Up:  Patient agrees to Care Plan and Follow-up.  Plan: The Managed Medicaid care management team will reach out to the patient again over the next 30 days. and The patient has been provided with contact information for the Managed Medicaid care management team and has been advised to call with any health related questions or concerns.  Date/time of next scheduled RN care management/care coordination outreach:  09/19/20 at 0900.

## 2020-08-31 ENCOUNTER — Other Ambulatory Visit: Payer: Self-pay | Admitting: Internal Medicine

## 2020-09-19 ENCOUNTER — Other Ambulatory Visit: Payer: Self-pay | Admitting: Obstetrics and Gynecology

## 2020-09-19 ENCOUNTER — Other Ambulatory Visit: Payer: Self-pay

## 2020-09-19 NOTE — Patient Instructions (Signed)
Visit Information  Maria Nelson was given information about Medicaid Managed Care team care coordination services as a part of their [place] Medicaid benefit. [name] verbally consented to engagement with the [facility] Managed Care team.   For questions related to your [facility], please call: [phone] or visit the homepage here: [email or web address]  If you would like to schedule transportation through your [facility], please call the following number at least 2 days in advance of your appointment: [phone].   Call the [place] at [phone], at any time, 24 hours a day, 7 days a week. If you are in danger or need immediate medical attention call 911.  [name] - following are the goals we discussed in your visit today:  Goals Addressed            This Visit's Progress   . Symptom Exacerbation Prevented or Minimized       Update 06/26/20:  Patient states she is doing well, no problems.  [facility].  Patient plans on scheduling appointment with her PCP as well-phone number given. Update 07/21/20:  Patient without complaint today-states will schedule appointment with her PCP. Update 08/20/20:  Patient seen at [facility] 08/15/20-has not scheduled appointment with PCP yet. Update 09/19/20:  Patient with no complaints today-needs to schedule PCP and MMG appointment.  Evidence-based guidance:    Assess risk for exacerbation (flare-up); anticipate adjustments based on risk, age and ability to perform testing.   Testing may include:   forced expiratory volume in 1 second   fractional concentration of exhaled nitric oxide, if available, for children ages 1 to 5 years   peak expiratory flow monitoring, which may be used in children with severe  asthma or impaired perception of airflow restriction   Encourage pulmonary rehabilitation, occupational or physical therapy for breathing retraining, respiratory muscle and physical training, especially when functional impairments are present.   Arrange early follow-up visit (within 1 to 2 weeks) after any symptom flare-up.   Assess comorbidity that may contribute to flare-up, such as rhinitis, gastroesophageal reflux, obesity, obstructive sleep apnea, depression or anxiety.   Assess and monitor both child and caregiver for signs/symptoms of psychosocial concerns, such as breath-anxiety cycle, depression or anxiety that may impact stability of symptoms.   Assess adherence, inhaler technique, persistent trigger exposure, barriers to treatment plan and lifestyle changes.   Encourage adherence with treatment plan, even when symptoms are ?ocontrolled? or infrequent.   Identify economic resources, sociocultural beliefs, social factors and health literacy that may interfere with adherence.   Promote lifestyle changes, when needed, including regular physical activity based on tolerance, weight loss, healthy eating and stress management.   Promote infection precautions; include avoiding and minimizing exposure to infections, especially viruses that do not respond to antibiotic therapy.   Encourage prevention strategies, such as up-to-date immunizations, frequent handwashing, containment of coughs and sneezes, good personal hygiene, adequate fluids, nutrition and sleep/rest.   Consider referral to nurse or community health worker home visiting program for intensive support and education (disease management program).        Patient verbalizes understanding of instructions provided today.   The Managed Medicaid care management team will reach out to the patient again over the next 30 days.  The patient has been provided with contact information for the Managed Medicaid care management team and  has been advised to call with  any health related questions or concerns.   Aida Raider RN, BSN [place]  [facility] Curator - Managed Medicaid High Risk [phone].  Following is a copy of your plan of care:  Patient Care Plan: Asthma (Adult)    Problem Identified: Disease Progression (Asthma)   Priority: Medium  Onset Date: 05/29/2020    Long-Range Goal: Disease Progression Prevented or Minimized   Start Date: 04/28/2020  Expected End Date: 10/20/2020  Recent Progress: On track  Priority: Medium  Note:   CARE PLAN ENTRY Medicaid Managed Care (see longitudinal plan of care for additional care plan information)  Current Barriers:  [name] Medication procurement  Nurse Case Manager Clinical Goal(s):  [name] Over the next 30 days, patient will verbalize understanding of plan for medication procurement. . Over the next 30 days, patient will work with provider to address needs related to medication refills. . Over the next 30 days, patient will attend all scheduled medical appointments. [name] Update 05/29/20:  Patient states she has her medications,  no problems at this time.  Interventions:  . Inter-disciplinary care team collaboration (see longitudinal plan of care) . Evaluation of current treatment plan related to asthma and patient's adherence to plan as established by provider. . Advised patient to contact provider's office for medication refills and appointment. . Provided education to patient re: medication procurement. . Reviewed medications with patient. . Discussed plans with patient for ongoing care management follow up and provided patient with direct contact information for care management team . Update 06/26/20:  Patient with no complaints-doing well.  Plans on scheduling appointment with PCP-phone number given. [name] Update 08/20/20:  Patient without complaint today-[facility]-needs to schedule PCP appointment. [name] Update  09/19/20:  No complaints today-to schedule PCP and MMG appointment.  Patient Self Care Activities:  . Patient will contact provider's office for appointment and medication refills. . Patient verbalizes understanding of plan to obtain medications, . Self administers medications as prescribed . Attends all scheduled provider appointments . Calls pharmacy for medication refills . Calls provider office for new concerns or questions . Patient will schedule appointment for flu vaccine and COVID booster. [name] Update 05/29/20: Patient has received both doses of Pfizer vaccine, needs booster. [name] Update 07/21/20:  COVID booster received.  Evidence-based guidance:   Assess symptom control by the frequency of daytime and nighttime asthma symptoms, reliever use and activity limitation at every encounter.   Note: Asthma control test, sputum eosinophil counts or similar may be added at intervals determined by team.   Identify comorbidity that may exacerbate symptoms, such as gastroesophageal reflux disease, rhinitis, obesity, obstructive sleep apnea, depression or anxiety.   Assess and reinforce correct inhaler technique frequently.   Encourage development and acceptance of self-management (asthma action) plan.   Consider home pulse oximetry for patients with decreased perception of hypoxia.   Identify and assist patient to manage identified triggers, such as smoking, viruses, eczema, weather changes, emotional upsets, excessive exercise, obesity and gastroesophageal reflux, as well as environmental allergens, including    pollen, dust mites, mold, pets and smoke; consider occupational-exposure reduction versus elimination to avoid compromising employment status.   Prepare for use of pharmacologic therapy, such as quick reliever, long-term controller, antihistamine, oral steroid, monoclonal anti-immunoglobulin E, immunizations in a stepped approach, based on presenting signs/symptoms and    laboratory  results; monitor side effects and expect periodic adjustments.   Prepare patient for laboratory and diagnostic exams based on risk and presentation.  Provide smoking cessation intervention using screening, brief intervention and referral for treatment.   Review medication use, including delivery method, dose, frequency; identify barriers to adherence, such as access to medication, adverse reaction or lack of skill.

## 2020-09-19 NOTE — Patient Outreach (Signed)
Medicaid Managed Care   Nurse Care Manager Note  09/19/2020 Name:  Maria Nelson MRN:  [id number] DOB:  Feb 25, 1956  Maria Nelson is an 65 y.o. year old female who is a primary patient of Madalyn Rob, MD.  The [facility] Managed Care Coordination team was consulted for assistance with:    chronic healthcare management needs.  Maria Nelson was given information about Medicaid Managed Care Coordination team services today. Maria Nelson agreed to services and verbal consent obtained.  Engaged with patient by telephone for follow up visit in response to provider referral for case management and/or care coordination services.   Assessments/Interventions:  Review of past medical history, allergies, medications, health status, including review of consultants reports, laboratory and other test data, was performed as part of comprehensive evaluation and provision of chronic care management services.  SDOH (Social Determinants of Health) assessments and interventions performed:   Care Plan  Allergies  Allergen Reactions  . Bee Venom Anaphylaxis  . Aspirin Nausea Only  . Other Rash    EKG pads cause burning sensation as soon as applied    Medications Reviewed Today    Reviewed by Gayla Medicus, RN (Registered Nurse) on 09/19/20 at [facility] List Status: <None>  Medication Order Taking? Sig Documenting Provider Last Dose Status Informant  albuterol (PROVENTIL) (2.5 MG/3ML) 0.083% nebulizer solution [id number] No Use 1 vial in nebulizer up to 3 times a day when albuterol inhaler not working. If no better or getting worse, call 911 or physician line Bobbitt, [place] Muta, MD Taking Active Self  Budeson-Glycopyrrol-Formoterol (BREZTRI AEROSPHERE) 160-9-4.8 MCG/ACT AERO [id number] No Inhale 2 puffs into the lungs 2 (two) times daily. Althea Charon, FNP Taking Active   cetirizine (ZYRTEC) 10 MG tablet [id number] No Take 1 tablet (10 mg total) by mouth  daily. Althea Charon, FNP Taking Active   EPINEPHrine (EPIPEN 2-PAK) 0.3 mg/0.3 mL IJ SOAJ injection [id number] No Inject 0.3 mg into the muscle as needed (for allergic reaction). Althea Charon, FNP Taking Active   ondansetron (ZOFRAN ODT) 4 MG disintegrating tablet [id number] No Take 1 tablet (4 mg total) by mouth every 8 (eight) hours as needed for nausea. Larene Pickett, PA-C Taking Active   pantoprazole (PROTONIX) 40 MG tablet [id number]  Take 1 tablet by mouth once daily Madalyn Rob, MD  Active   [facility] HFA 108 [phone] Base) MCG/ACT inhaler [id number] No Inhale 2 puffs into the lungs every 4 (four) hours as needed for wheezing or shortness of breath. Althea Charon, FNP Taking Active           Patient Active Problem List   Diagnosis Date Noted  . Hyperlipidemia 11/22/2019  . UTI (urinary tract infection) 11/22/2019  . History of 2019 novel coronavirus disease (COVID-19) 05/11/2019  . COVID-19 04/30/2019  . Adnexal pain 09/06/2018  . SOB (shortness of breath) 05/26/2018  . Rash 05/11/2018  . Paroxysmal atrial fibrillation ([place]) 08/24/2017  . Dyspepsia 08/24/2017  . Knee pain, left 08/16/2016  . Back pain 06/23/2016  . Urticaria 07/01/2015  . 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 Allergic rhinitis with nonallergic component 03/04/2015  . COPD (chronic obstructive pulmonary disease) ([place]) 06/19/2014  . Blood pressure elevated without history of HTN 03/05/2014  . HNP (herniated nucleus pulposus), lumbar 04/16/2013  . Tenosynovitis, de Quervain 11/15/2012  . Morbid obesity with BMI of 45.0-49.9, adult ([place]) 09/08/2012  . Carpal tunnel syndrome 06/23/2011  . Sleep apnea 09/02/2010  . Preventative health care 07/28/2010  . GERD 12/24/2009  .  Urticaria/angioedema 12/16/2009  . STRESS INCONTINENCE 01/24/2007  . History of peptic ulcer disease 07/26/2006    Conditions to be addressed/monitored per PCP order:  chronic healthcare management needs,  asthma with COPD overlap, chronic  urticaria, allergic rhinitis and reflux.    Care Plan : Asthma (Adult)  Updates made by Gayla Medicus, RN since 09/19/2020 12:00 AM    Problem: Disease Progression (Asthma)   Priority: Medium  Onset Date: 05/29/2020    Long-Range Goal: Disease Progression Prevented or Minimized   Start Date: 04/28/2020  Expected End Date: 10/20/2020  Recent Progress: On track  Priority: Medium  Note:   CARE PLAN ENTRY Medicaid Managed Care (see longitudinal plan of care for additional care plan information)  Current Barriers:  [name] Medication procurement  Nurse Case Manager Clinical Goal(s):  [name] Over the next 30 days, patient will verbalize understanding of plan for medication procurement. . Over the next 30 days, patient will work with provider to address needs related to medication refills. . Over the next 30 days, patient will attend all scheduled medical appointments. [name] Update 05/29/20:  Patient states she has her medications,  no problems at this time.  Interventions:  . Inter-disciplinary care team collaboration (see longitudinal plan of care) . Evaluation of current treatment plan related to asthma and patient's adherence to plan as established by provider. . Advised patient to contact provider's office for medication refills and appointment. . Provided education to patient re: medication procurement. . Reviewed medications with patient. . Discussed plans with patient for ongoing care management follow up and provided patient with direct contact information for care management team . Update 06/26/20:  Patient with no complaints-doing well.  Plans on scheduling appointment with PCP-phone number given. [name] Update 08/20/20:  Patient without complaint today-[facility]-needs to schedule PCP appointment. [name] Update 09/19/20:  No complaints today-to schedule PCP and MMG appointment.  Patient Self Care Activities:  . Patient will contact provider's office for appointment and  medication refills. . Patient verbalizes understanding of plan to obtain medications, . Self administers medications as prescribed . Attends all scheduled provider appointments . Calls pharmacy for medication refills . Calls provider office for new concerns or questions . Patient will schedule appointment for flu vaccine and COVID booster. [name] Update 05/29/20: Patient has received both doses of Pfizer vaccine, needs booster. [name] Update 07/21/20:  COVID booster received.  Evidence-based guidance:   Assess symptom control by the frequency of daytime and nighttime asthma symptoms, reliever use and activity limitation at every encounter.   Note: Asthma control test, sputum eosinophil counts or similar may be added at intervals determined by team.   Identify comorbidity that may exacerbate symptoms, such as gastroesophageal reflux disease, rhinitis, obesity, obstructive sleep apnea, depression or anxiety.   Assess and reinforce correct inhaler technique frequently.   Encourage development and acceptance of self-management (asthma action) plan.   Consider home pulse oximetry for patients with decreased perception of hypoxia.   Identify and assist patient to manage identified triggers, such as smoking, viruses, eczema, weather changes, emotional upsets, excessive exercise, obesity and gastroesophageal reflux, as well as environmental allergens, including    pollen, dust mites, mold, pets and smoke; consider occupational-exposure reduction versus elimination to avoid compromising employment status.   Prepare for use of pharmacologic therapy, such as quick reliever, long-term controller, antihistamine, oral steroid, monoclonal anti-immunoglobulin E, immunizations in a stepped approach, based on presenting signs/symptoms and    laboratory results; monitor side effects and  expect periodic adjustments.   Prepare patient for laboratory and diagnostic exams based on risk and presentation.   Provide  smoking cessation intervention using screening, brief intervention and referral for treatment.   Review medication use, including delivery method, dose, frequency; identify barriers to adherence, such as access to medication, adverse reaction or lack of skill.       Follow Up:  Patient agrees to Care Plan and Follow-up.  Plan: The Managed Medicaid care management team will reach out to the patient again over the next 30 days. and The patient has been provided with contact information for the Managed Medicaid care management team and has been advised to call with any health related questions or concerns.  Date/time of next scheduled RN care management/care coordination outreach:  10/14/20 at 0900.

## 2020-10-14 ENCOUNTER — Other Ambulatory Visit: Payer: Self-pay | Admitting: Obstetrics and Gynecology

## 2020-10-14 NOTE — Patient Instructions (Signed)
Visit Information  Ms. [name]  - as a part of your Medicaid benefit, you are eligible for care management and care coordination services at no cost or copay. I was unable to reach you by phone today but would be happy to help you with your health related needs. Please feel free to call me at [phone].  A member of the Managed Medicaid care management team will reach out to you again over the next 7- 14 days.   Aida Raider RN, BSN [place]  [facility] Curator - Managed Medicaid High Risk [phone].

## 2020-10-14 NOTE — Patient Outreach (Signed)
Care Coordination  10/14/2020  Maria Nelson 14-Feb-1956 [id number]   Medicaid Managed Care   Unsuccessful Outreach Note  10/14/2020 Name: Maria Nelson MRN: [id number] DOB: 1956-04-01  Referred by: Madalyn Rob, MD Reason for referral : High Risk Managed Medicaid (Unsuccessful telephone outreach)   An unsuccessful telephone outreach was attempted today. The patient was referred to the case management team for assistance with care management and care coordination.   Follow Up Plan: The care management team will reach out to the patient again over the next 7-14 days.   Aida Raider RN, BSN [place]  [facility] Curator - Managed Medicaid High Risk [phone]

## 2020-10-17 ENCOUNTER — Other Ambulatory Visit: Payer: Self-pay | Admitting: Cardiology

## 2020-10-17 DIAGNOSIS — Z1231 Encounter for screening mammogram for malignant neoplasm of breast: Secondary | ICD-10-CM

## 2020-10-30 ENCOUNTER — Telehealth: Payer: Medicare Other

## 2020-10-30 ENCOUNTER — Other Ambulatory Visit: Payer: Self-pay

## 2020-10-30 ENCOUNTER — Other Ambulatory Visit: Payer: Self-pay | Admitting: Family

## 2020-10-30 MED ORDER — BREZTRI AEROSPHERE 160-9-4.8 MCG/ACT IN AERO: 2.0000 | INHALATION_SPRAY | Freq: Two times a day (BID) | RESPIRATORY_TRACT | 1 refills | Status: DC

## 2020-10-30 NOTE — Telephone Encounter (Signed)
Pa submited thru cover my meds for breztri waiting on response

## 2020-10-30 NOTE — Telephone Encounter (Signed)
Ok to refill Breztri 2 puffs twice a day with a spacer.

## 2020-10-30 NOTE — Telephone Encounter (Signed)
Breztri aerosphere 160-9-4.8 hfa  has been approved fax'd order into [employer] [facility] [place]. Pt. Aware.

## 2020-11-06 NOTE — Telephone Encounter (Signed)
PA for Breztri approved for 09-30-20 through 10-30-21. PA approval sent to [place]. Patient informed.

## 2020-11-14 ENCOUNTER — Other Ambulatory Visit: Payer: Self-pay | Admitting: Obstetrics and Gynecology

## 2020-11-14 ENCOUNTER — Other Ambulatory Visit: Payer: Self-pay

## 2020-11-14 NOTE — Patient Outreach (Signed)
Care Coordination  11/14/2020  Maria Nelson [facility] 1956-04-19 [id number]  RNCM called patient at scheduled time.  Patient's insurance has changed to [employer] and [employer], no longer has Managed Medicaid.  Informed patient I would no longer be able to follow her as she no longer has Managed Medicaid.  Instructed patient to contact her PCP's office for any Case Management needs or Medicare/Medicaid, patient expressed understanding.  Aida Raider RN, BSN [place]  Tri[facility] Curator - Managed Medicaid High Risk [phone].

## 2020-11-18 ENCOUNTER — Other Ambulatory Visit: Payer: Self-pay | Admitting: Internal Medicine

## 2020-11-20 ENCOUNTER — Other Ambulatory Visit: Payer: Self-pay | Admitting: Family

## 2020-11-21 ENCOUNTER — Other Ambulatory Visit: Payer: Self-pay

## 2020-11-21 MED ORDER — CETIRIZINE HCL 10 MG PO TABS: 10.0000 mg | ORAL_TABLET | Freq: Every day | ORAL | 0 refills | Status: DC

## 2020-11-23 NOTE — Telephone Encounter (Signed)
This appears to be a duplicate. Prescription sent on 11/21/20.

## 2020-11-24 NOTE — Telephone Encounter (Signed)
Duplicate

## 2020-11-27 ENCOUNTER — Other Ambulatory Visit: Payer: Self-pay | Admitting: *Deleted

## 2020-11-27 MED ORDER — CETIRIZINE HCL 10 MG PO TABS: 10.0000 mg | ORAL_TABLET | Freq: Every day | ORAL | 1 refills | Status: DC

## 2020-12-12 ENCOUNTER — Other Ambulatory Visit: Payer: Self-pay

## 2020-12-12 ENCOUNTER — Ambulatory Visit
Admission: RE | Admit: 2020-12-12 | Discharge: 2020-12-12 | Disposition: A | Discharge status: Home / Self Care | Payer: Medicare Other | Source: Ambulatory Visit | Attending: Cardiology | Admitting: Cardiology

## 2020-12-12 DIAGNOSIS — Z1231 Encounter for screening mammogram for malignant neoplasm of breast: Secondary | ICD-10-CM

## 2020-12-19 ENCOUNTER — Encounter: Payer: Self-pay | Admitting: Allergy

## 2020-12-19 ENCOUNTER — Ambulatory Visit (INDEPENDENT_AMBULATORY_CARE_PROVIDER_SITE_OTHER): Payer: Medicare Other | Admitting: Allergy

## 2020-12-19 ENCOUNTER — Other Ambulatory Visit: Payer: Self-pay

## 2020-12-19 VITALS — BP 110/70 | HR 66 | Temp 97.6°F | Resp 18 | Ht 60.0 in | Wt 256.4 lb

## 2020-12-19 DIAGNOSIS — J449 Chronic obstructive pulmonary disease, unspecified: Secondary | ICD-10-CM | POA: Diagnosis not present

## 2020-12-19 DIAGNOSIS — J4489 Other specified chronic obstructive pulmonary disease: Secondary | ICD-10-CM

## 2020-12-19 DIAGNOSIS — J3089 Other allergic rhinitis: Secondary | ICD-10-CM

## 2020-12-19 DIAGNOSIS — K219 Gastro-esophageal reflux disease without esophagitis: Secondary | ICD-10-CM

## 2020-12-19 DIAGNOSIS — L501 Idiopathic urticaria: Secondary | ICD-10-CM

## 2020-12-19 NOTE — Patient Instructions (Addendum)
Asthma/COPD Continue Breztri 2 puffs twice a day with spacer to help prevent cough and wheeze Continue albuterol 2 puffs every 4 hours as needed for cough or wheeze OR albuterol via nebulizer 1 unit dose every 4-6 hours as needed for cough, wheeze, tightness in chest, or shortness of breath. You may use albuterol 2 puffs 5-15 minutes before activity  Chronic urticaria-stable Continuine cetirizine 10 mg once a day as needed for hives.  You may take an additional cetirizine 10 mg for breakthrough hives or itching.   Allergic rhinitis Use azelastine 2 sprays twice a day as needed for a runny nose Continue cetirizine 10 mg once a day as needed for a runny nose as listed above Consider saline nasal rinses as needed for nasal symptoms. Use this before any medicated nasal sprays for best result  Reflux Continue Pantoprazole 40 mg once a day as previously prescribed.  Continue dietary and lifestyle modifications as listed below  Call the clinic if this treatment plan is not working well for you   Schedule a follow up appointment in 6-12 months or sooner if needed

## 2020-12-19 NOTE — Progress Notes (Signed)
Follow-up Note  RE: Ariceli Stobaugh MRN: [id number] DOB: 1955/11/30 Date of Office Visit: 12/19/2020   History of present illness: Maria Nelson is a 65 y.o. female presenting today for follow-up of asthma with COPD overlap, urticaria, allergic rhinitis and reflux.  She was last seen in the office on 08/15/2020 by myself.  She has been doing well since this visit without any major health changes, surgeries or hospitalizations.  She states she is doing quite well because "I do what I have to do".  Thus she takes her breztri 2 puffs twice a day.  With this she states she has not needed to use her albuterol inhaler or nebulizer since the last visit she has not had any urgent care or ED visits or any systemic steroid needs since the last visit.  She denies nighttime awakenings. She has not had any hives since her last visit.  She has not needed to use cetirizine. With her allergic rhinitis she denies any symptoms at this time.  She has had no use of azelastine and like above no use of cetirizine. She does continue to take pantoprazole daily for reflux control.  Review of systems: Review of Systems  Constitutional: Negative.   HENT: Negative.    Eyes: Negative.   Respiratory: Negative.    Cardiovascular: Negative.   Gastrointestinal: Negative.   Musculoskeletal: Negative.   Skin: Negative.   Neurological: Negative.   All other systems negative unless noted above in HPI All other systems negative unless noted above in HPI  Past medical/social/surgical/family history have been reviewed and are unchanged unless specifically indicated below.  No changes  Medication List: Current Outpatient Medications  Medication Sig Dispense Refill   albuterol (PROVENTIL) (2.5 MG/3ML) 0.083% nebulizer solution Use 1 vial in nebulizer up to 3 times a day when albuterol inhaler not working. If no better or getting worse, call 911 or physician line 75 mL 3    Budeson-Glycopyrrol-Formoterol (BREZTRI AEROSPHERE) 160-9-4.8 MCG/ACT AERO Inhale 2 puffs into the lungs 2 (two) times daily. 32.1 g 1   cetirizine (ZYRTEC) 10 MG tablet Take 1 tablet (10 mg total) by mouth daily. 90 tablet 1   EPINEPHrine (EPIPEN 2-PAK) 0.3 mg/0.3 mL IJ SOAJ injection Inject 0.3 mg into the muscle as needed (for allergic reaction). 1 each 0   pantoprazole (PROTONIX) 40 MG tablet Take 1 tablet by mouth once daily 90 tablet 1   PROAIR HFA 108 (90 Base) MCG/ACT inhaler Inhale 2 puffs into the lungs every 4 (four) hours as needed for wheezing or shortness of breath. 18 g 3   No current facility-administered medications for this visit.     Known medication allergies: Allergies  Allergen Reactions   Bee Venom Anaphylaxis   Aspirin Nausea Only   Other Rash    EKG pads cause burning sensation as soon as applied     Physical examination: Blood pressure 110/70, pulse 66, temperature 97.6 F (36.4 C), temperature source Temporal, resp. rate 18, height 5' (1.524 m), weight 256 lb 6.4 oz (116.3 kg), last menstrual period 04/19/2004, SpO2 94 %.  General: Alert, interactive, in no acute distress. HEENT: PERRLA, TMs pearly gray, turbinates non-edematous without discharge, post-pharynx non erythematous. Neck: Supple without lymphadenopathy. Lungs: Clear to auscultation without wheezing, rhonchi or rales. {no increased work of breathing. CV: Normal S1, S2 without murmurs. Abdomen: Nondistended, nontender. Skin: Warm and dry, without lesions or rashes. Extremities:  No clubbing, cyanosis or edema. Neuro:   Grossly intact.  Diagnositics/Labs: None  today  Assessment and plan:   Asthma/COPD Continue Breztri 2 puffs twice a day with spacer to help prevent cough and wheeze Continue albuterol 2 puffs every 4 hours as needed for cough or wheeze OR albuterol via nebulizer 1 unit dose every 4-6 hours as needed for cough, wheeze, tightness in chest, or shortness of breath. You may use  albuterol 2 puffs 5-15 minutes before activity  Chronic urticaria-stable Continuine cetirizine 10 mg once a day as needed for hives.  You may take an additional cetirizine 10 mg for breakthrough hives or itching.   Allergic rhinitis Use azelastine 2 sprays twice a day as needed for a runny nose Continue cetirizine 10 mg once a day as needed for a runny nose as listed above Consider saline nasal rinses as needed for nasal symptoms. Use this before any medicated nasal sprays for best result  Reflux Continue Pantoprazole 40 mg once a day as previously prescribed.  Continue dietary and lifestyle modifications as listed below  Call the clinic if this treatment plan is not working well for you   Schedule a follow up appointment in 6-12 months or sooner if needed  I appreciate the opportunity to take part in Kamri's care. Please do not hesitate to contact me with questions.  Sincerely,   Prudy Feeler, MD Allergy/Immunology [facility] [place] [facility]

## 2021-01-28 ENCOUNTER — Encounter: Payer: Self-pay | Admitting: Student

## 2021-01-28 ENCOUNTER — Ambulatory Visit (INDEPENDENT_AMBULATORY_CARE_PROVIDER_SITE_OTHER): Payer: Medicare Other | Admitting: Student

## 2021-01-28 VITALS — BP 112/74 | HR 68 | Temp 98.9°F | Wt 261.0 lb

## 2021-01-28 DIAGNOSIS — Z13820 Encounter for screening for osteoporosis: Secondary | ICD-10-CM

## 2021-01-28 DIAGNOSIS — Z0000 Encounter for general adult medical examination without abnormal findings: Secondary | ICD-10-CM | POA: Diagnosis not present

## 2021-01-28 DIAGNOSIS — Z23 Encounter for immunization: Secondary | ICD-10-CM | POA: Diagnosis not present

## 2021-01-28 DIAGNOSIS — Z1211 Encounter for screening for malignant neoplasm of colon: Secondary | ICD-10-CM

## 2021-01-28 DIAGNOSIS — Z6841 Body Mass Index (BMI) 40.0 and over, adult: Secondary | ICD-10-CM

## 2021-01-28 DIAGNOSIS — J439 Emphysema, unspecified: Secondary | ICD-10-CM | POA: Diagnosis not present

## 2021-01-28 NOTE — Assessment & Plan Note (Signed)
Placed referral to GI for screening colonoscopy and order for DEXA scan.

## 2021-01-28 NOTE — Assessment & Plan Note (Signed)
Patient with morbid obesity (BMI 50.97). Discussed importance of healthy diet and regular exercise (>150 minutes/week). States that she knows that she needs to work on this and is planning to begin water aerobics to help with weight loss.  Plan: -weight loss counseling at subsequent visits

## 2021-01-28 NOTE — Assessment & Plan Note (Signed)
>>  ASSESSMENT AND PLAN FOR COPD (CHRONIC OBSTRUCTIVE PULMONARY DISEASE) (Learned) WRITTEN ON 01/28/2021  4:19 PM BY Virl Axe, MD  States that her COPD has been well controlled with inhaler therapy. Has not had any recent attacks or feelings of SHOB. She does follow an asthma clinic regularly.  Plan: -continue inhalers (breztri, proair, albuterol)

## 2021-01-28 NOTE — Progress Notes (Signed)
   CC: routine f/u visit  HPI:  [name] is a 65 y.o. female with history listed below presenting to the [facility] for routine f/u visit. Please see individualized problem based charting for full HPI.  Past Medical History:  Diagnosis Date   ANGIOEDEMA 12/16/2009   Asthma    Carpal tunnel syndrome 06/23/2011   Patient notes history of bilateral carpal tunnel syndrome.  Now has symptoms of right hand carpal tunnel.    COPD (chronic obstructive pulmonary disease) ([place])    secondary to tobacco use // No PFTs on file   COVID-19    ETOH abuse    Pt stopped in 3/08   Gastrointestinal hemorrhage    secondary to PUD on March 2008   OSA on CPAP    noncompliant with CPAP (2/2 it being depressing)   Peptic ulcer disease    +H. pylori (antigen)- Dr. Olevia Perches- treated   STRESS INCONTINENCE 01/24/2007   Tobacco abuse    quit in 2010   Urinary incontinence    Urticaria     Review of Systems:  Negative aside from that listed in individualized problem based charting.  Physical Exam:  Vitals:   01/28/21 1453  BP: 112/74  Pulse: 68  Temp: 98.9 F (37.2 C)  TempSrc: Oral  SpO2: 99%  Weight: 261 lb (118.4 kg)   Physical Exam Constitutional:      Appearance: She is obese.  HENT:     Mouth/Throat:     Mouth: Mucous membranes are moist.     Pharynx: Oropharynx is clear. No oropharyngeal exudate.  Eyes:     Extraocular Movements: Extraocular movements intact.     Conjunctiva/sclera: Conjunctivae normal.     Pupils: Pupils are equal, round, and reactive to light.  Cardiovascular:     Rate and Rhythm: Normal rate and regular rhythm.     Pulses: Normal pulses.     Heart sounds: Normal heart sounds. No murmur heard.   No friction rub. No gallop.  Pulmonary:     Effort: Pulmonary effort is normal.     Breath sounds: Normal breath sounds. No wheezing, rhonchi or rales.  Abdominal:     General: Bowel sounds are normal. There is no distension.     Palpations: Abdomen is  soft.     Tenderness: There is no abdominal tenderness.  Musculoskeletal:        General: No swelling. Normal range of motion.  Skin:    General: Skin is warm and dry.  Neurological:     General: No focal deficit present.     Mental Status: She is alert and oriented to person, place, and time.  Psychiatric:        Mood and Affect: Mood normal.        Behavior: Behavior normal.     Assessment & Plan:   See Encounters Tab for problem based charting.  Patient discussed with Dr. Dareen Piano

## 2021-01-28 NOTE — Patient Instructions (Signed)
Maria Nelson,  It was a pleasure seeing you in the clinic today.   I have placed a referral to the stomach doctor to set up your colonoscopy. Someone will call you to schedule this. I have placed an order for your bone scan. Someone will call you to schedule this. Please get your COVID booster shot.  Please call our clinic at [phone] if you have any questions or concerns. The best time to call is Monday-Friday from 9am-4pm, but there is someone available 24/7 at the same number. If you need medication refills, please notify your pharmacy one week in advance and they will send [place] a request.   Thank you for letting us take part in your care. We look forward to seeing you next time!

## 2021-01-28 NOTE — Assessment & Plan Note (Signed)
States that her COPD has been well controlled with inhaler therapy. Has not had any recent attacks or feelings of SHOB. She does follow an asthma clinic regularly.  Plan: -continue inhalers (breztri, proair, albuterol)

## 2021-01-29 NOTE — Progress Notes (Signed)
Internal Medicine Clinic Attending  Case discussed with Dr. Jinwala  At the time of the visit.  We reviewed the resident's history and exam and pertinent patient test results.  I agree with the assessment, diagnosis, and plan of care documented in the resident's note.  

## 2021-02-02 ENCOUNTER — Telehealth: Payer: Self-pay

## 2021-02-02 NOTE — Telephone Encounter (Signed)
Patient was scheduled for a colonoscopy on 02-11-2021. Her pre visit today shows her BMI to be 50.97 while talking to her on the phone she had audible wheezing, she has COPD with asthma.

## 2021-02-03 NOTE — Telephone Encounter (Signed)
Follow up scheduled for 03-06-2021 with Dr Silverio Decamp to discuss colonoscopy

## 2021-02-03 NOTE — Telephone Encounter (Signed)
Please schedule office visit with next available provider to discuss options for colorectal cancer screening, if patient opts for colonoscopy will need to schedule it for [facility]. Thanks

## 2021-02-11 ENCOUNTER — Encounter: Payer: Medicare Other | Admitting: Gastroenterology

## 2021-02-17 ENCOUNTER — Other Ambulatory Visit: Payer: Self-pay | Admitting: Student

## 2021-02-17 DIAGNOSIS — Z13820 Encounter for screening for osteoporosis: Secondary | ICD-10-CM

## 2021-03-06 ENCOUNTER — Encounter: Payer: Self-pay | Admitting: Gastroenterology

## 2021-03-06 ENCOUNTER — Telehealth: Payer: Self-pay | Admitting: Family

## 2021-03-06 ENCOUNTER — Other Ambulatory Visit: Payer: Self-pay | Admitting: *Deleted

## 2021-03-06 ENCOUNTER — Ambulatory Visit (INDEPENDENT_AMBULATORY_CARE_PROVIDER_SITE_OTHER): Payer: Medicare Other | Admitting: Gastroenterology

## 2021-03-06 VITALS — BP 136/80 | HR 76 | Ht 59.75 in | Wt 256.8 lb

## 2021-03-06 DIAGNOSIS — Z1211 Encounter for screening for malignant neoplasm of colon: Secondary | ICD-10-CM | POA: Diagnosis not present

## 2021-03-06 NOTE — Telephone Encounter (Signed)
Had representative from [employer] called stating pt's cetirizine is not covered. Representative asked if Chrissie could send something different in for patient.  Best contact number for patient: [phone]

## 2021-03-06 NOTE — Telephone Encounter (Signed)
Please find out what the preferred antihistamine is.   Thank you! Maria Charon, FNP

## 2021-03-06 NOTE — Patient Instructions (Addendum)
We will send you a reminder after January 15th 2023 about scheduling your colonoscopy. At that time we will check your weight to make sure the procedure can done in our Endoscopy unit. BMI has to be less than 88   If you are age 65 or older, your body mass index should be between 23-30. Your Body mass index is 50.57 kg/m. If this is out of the aforementioned range listed, please consider follow up with your Primary Care Provider.  If you are age 37 or younger, your body mass index should be between 19-25. Your Body mass index is 50.57 kg/m. If this is out of the aformentioned range listed, please consider follow up with your Primary Care Provider.   ________________________________________________________  The [facility] providers would like to encourage you to use [facility] to communicate with providers for non-urgent requests or questions.  Due to long hold times on the telephone, sending your provider a message by [facility] may be a faster and more efficient way to get a response.  Please allow 48 business hours for a response.  Please remember that this is for non-urgent requests.  _______________________________________________________   Thank you for choosing [facility]  Kavitha Nandigam,MD

## 2021-03-06 NOTE — Progress Notes (Signed)
Maria Nelson    [id number]    Feb 29, 1956  Primary Care Physician:Steen, Jeneen Rinks, MD  Referring Physician: Madalyn Rob, MD [address] [place],   [address]   Chief complaint: Colon cancer screening  HPI:  65 yr very pleasant female here to discuss colorectal cancer screening No family history of colon cancer Denies any change in bowel habits, nausea, vomiting, abdominal pain, melena or bright red blood per rectum   Colonoscopy July 23, 2009 by Dr. Sharlett Iles: Normal exam with no polyps   We did discuss cologaurd during last office visit in 2021, she did not do well because she never received the kit.  She has modified her diet and also has increased her activity, she has lost few pounds in the past month and is planning to continue with her diet and exercise program to lose weight further.  Outpatient Encounter Medications as of 03/06/2021  Medication Sig   albuterol (PROVENTIL) (2.5 MG/3ML) 0.083% nebulizer solution Use 1 vial in nebulizer up to 3 times a day when albuterol inhaler not working. If no better or getting worse, call 911 or physician line   Budeson-Glycopyrrol-Formoterol (BREZTRI AEROSPHERE) 160-9-4.8 MCG/ACT AERO Inhale 2 puffs into the lungs 2 (two) times daily.   cetirizine (ZYRTEC) 10 MG tablet Take 1 tablet (10 mg total) by mouth daily.   EPINEPHrine (EPIPEN 2-PAK) 0.3 mg/0.3 mL IJ SOAJ injection Inject 0.3 mg into the muscle as needed (for allergic reaction).   pantoprazole (PROTONIX) 40 MG tablet Take 1 tablet by mouth once daily   PROAIR HFA 108 (90 Base) MCG/ACT inhaler Inhale 2 puffs into the lungs every 4 (four) hours as needed for wheezing or shortness of breath.   No facility-administered encounter medications on file as of 03/06/2021.    Allergies as of 03/06/2021 - Review Complete 03/06/2021  Allergen Reaction Noted   Bee venom Anaphylaxis 06/30/2012   Aspirin Nausea Only    Other Rash 05/10/2018     Past Medical History:  Diagnosis Date   ANGIOEDEMA 12/16/2009   Asthma    Carpal tunnel syndrome 06/23/2011   Patient notes history of bilateral carpal tunnel syndrome.  Now has symptoms of right hand carpal tunnel.    COPD (chronic obstructive pulmonary disease) ([place])    secondary to tobacco use // No PFTs on file   COVID-19    ETOH abuse    Pt stopped in 3/08   Gastrointestinal hemorrhage    secondary to PUD on March 2008   OSA on CPAP    noncompliant with CPAP (2/2 it being depressing)   Peptic ulcer disease    +H. pylori (antigen)- Dr. Olevia Perches- treated   STRESS INCONTINENCE 01/24/2007   Tobacco abuse    quit in 2010   Urinary incontinence    Urticaria     Past Surgical History:  Procedure Laterality Date   BREAST EXCISIONAL BIOPSY Right 1994   MANDIBLE SURGERY     due to trauma   TUBAL LIGATION      Family History  Problem Relation Age of Onset   Diabetes Mother    Coronary artery disease Mother 52       requiring 5 vessel CABG   Diabetes Sister    Breast cancer Sister 45       died 2/2 breast ca age 35-60    Social History   Socioeconomic History   Marital status: Single    Spouse name: Not  on file   Number of children: 3   Years of education: 12th grade   Highest education level: Not on file  Occupational History   Occupation: Disability    Comment: previously worked in [employer] had to stop 2/2 occupational exposures leading to exacerbations of asthma   Tobacco Use   Smoking status: Former    Packs/day: 1.00    Years: 30.00    Pack years: 30.00    Types: Cigarettes    Quit date: 12/04/2010    Years since quitting: 10.2   Smokeless tobacco: Never  Vaping Use   Vaping Use: Never used  Substance and Sexual Activity   Alcohol use: Yes    Alcohol/week: 0.0 standard drinks    Comment: now drinking a beer on weekends //    Drug use: No   Sexual activity: Not Currently  Other Topics Concern   Not on file  Social History Narrative   Pt is not  working now, was previously a Scientist, water quality at [employer].      Pt lives with her son      Pt has received financial assistance approval for 100% discount at [facility] and has [facility] card.    Social Determinants of Health   Financial Resource Strain: Not on file  Food Insecurity: Not on file  Transportation Needs: Not on file  Physical Activity: Not on file  Stress: Not on file  Social Connections: Not on file  Intimate Partner Violence: Not on file      Review of systems: All other review of systems negative except as mentioned in the HPI.   Physical Exam: Vitals:   03/06/21 1338  BP: 136/80  Pulse: 76   Body mass index is 50.57 kg/m. Gen:      No acute distress HEENT:  sclera anicteric Abd:      soft, non-tender; no palpable masses, no distension Ext:    No edema Neuro: alert and oriented x 3 Psych: normal mood and affect  Data Reviewed:  Reviewed labs, radiology imaging, old records and pertinent past GI work up   Assessment and Plan/Recommendations:  65 year old female with morbid obesity BMI greater than 50, COPD, proximal A. fib and sleep apnea   Patient is average risk for colorectal cancer screening, discussed various available options. Patient wants to proceed with colonoscopy for colorectal cancer screening  She wants to hold off scheduling the procedure at hospital  She is highly motivated to lose weight, she needs to be below 245 pounds for BMI goal below 50. Advised patient to call us back if she reaches that goal sooner, will check back in 3 months  Discussed dietary changes and exercise for weight loss  Return after colonoscopy as needed  This visit required 20 minutes of patient care (this includes precharting, chart review, review of results, face-to-face time used for counseling as well as treatment plan and follow-up. The patient was provided an opportunity to ask questions and all were answered. The patient agreed with the plan and  demonstrated an understanding of the instructions.  Damaris Hippo , MD    CC: Madalyn Rob, MD

## 2021-03-09 NOTE — Telephone Encounter (Signed)
Called and left a voicemail asking for patient to return call to discuss.  °

## 2021-03-09 NOTE — Telephone Encounter (Signed)
Please let Geraldiine know that her insurance is not covering Zyrtec (cetirizine). One option is that she can buy this over the counter or we can see if Xyzal (levocetirizine) 5 mg once  a day as needed is covered. Which would she prefer?  Thank you, Ronni Rumble, FNP

## 2021-03-09 NOTE — Telephone Encounter (Signed)
Spoke with a representative from [employer] for 47 minutes. Was transferred 3 times before getting an answer that cetirizine would need a Prior Authorization. The representative tried to get alternatives however it did not give them any. Unfortunately we will have to send in medication to know if it is covered or not. Please advise.

## 2021-03-10 NOTE — Telephone Encounter (Signed)
Called and left a voicemail asking for patient to return call to discuss.  °

## 2021-03-11 ENCOUNTER — Other Ambulatory Visit: Payer: Self-pay | Admitting: *Deleted

## 2021-03-11 MED ORDER — LEVOCETIRIZINE DIHYDROCHLORIDE 5 MG PO TABS: 5.0000 mg | ORAL_TABLET | Freq: Every evening | ORAL | 5 refills | Status: DC

## 2021-03-11 NOTE — Telephone Encounter (Signed)
A new prescription has been sent in to the requested pharmacy.

## 2021-03-11 NOTE — Telephone Encounter (Signed)
Patient states she is fine with trying [place], [place] [address]   Best contact number: [phone]

## 2021-03-14 NOTE — Telephone Encounter (Signed)
Thank you :)

## 2021-05-25 ENCOUNTER — Ambulatory Visit (HOSPITAL_COMMUNITY)
Admission: EM | Admit: 2021-05-25 | Discharge: 2021-05-25 | Disposition: A | Discharge status: Home / Self Care | Payer: Medicare Other | Attending: Physician Assistant | Admitting: Physician Assistant

## 2021-05-25 ENCOUNTER — Other Ambulatory Visit: Payer: Self-pay

## 2021-05-25 ENCOUNTER — Encounter (HOSPITAL_COMMUNITY): Payer: Self-pay

## 2021-05-25 DIAGNOSIS — T7840XA Allergy, unspecified, initial encounter: Secondary | ICD-10-CM | POA: Diagnosis not present

## 2021-05-25 MED ORDER — METHYLPREDNISOLONE SODIUM SUCC 125 MG IJ SOLR
INTRAMUSCULAR | Status: AC
  Filled 2021-05-25: qty 2

## 2021-05-25 MED ORDER — PREDNISONE 20 MG PO TABS
40.0000 mg | ORAL_TABLET | Freq: Every day | ORAL | 0 refills | Status: AC
Start: 2021-05-25 — End: 2021-05-30

## 2021-05-25 MED ORDER — METHYLPREDNISOLONE SODIUM SUCC 125 MG IJ SOLR
80.0000 mg | Freq: Once | INTRAMUSCULAR | Status: AC
  Administered 2021-05-25: 80 mg via INTRAMUSCULAR

## 2021-05-25 NOTE — ED Provider Notes (Signed)
[place]    CSN: [id number] Arrival date & time: 05/25/21  8127      History   Chief Complaint Chief Complaint  Patient presents with   Allergic Reaction    HPI Maria Nelson is a 66 y.o. female.   Patient here today for evaluation of suspected allergic reaction that started 2 days ago that seems to be progressing. She reports that she has hive like rash to her upper chest as well as swelling and pain to her lips. She has not had any known new exposures including to food, medication or body care/ laundry products. She denies shortness of breath. She does not report fever. She has no trouble swallowing. She has tried benadryl without significant relief. She has had angioedema in the past.   The history is provided by the patient.   Past Medical History:  Diagnosis Date   ANGIOEDEMA 12/16/2009   Asthma    Carpal tunnel syndrome 06/23/2011   Patient notes history of bilateral carpal tunnel syndrome.  Now has symptoms of right hand carpal tunnel.    COPD (chronic obstructive pulmonary disease) ([place])    secondary to tobacco use // No PFTs on file   COVID-19    ETOH abuse    Pt stopped in 3/08   Gastrointestinal hemorrhage    secondary to PUD on March 2008   OSA on CPAP    noncompliant with CPAP (2/2 it being depressing)   Peptic ulcer disease    +H. pylori (antigen)- Dr. Olevia Perches- treated   STRESS INCONTINENCE 01/24/2007   Tobacco abuse    quit in 2010   Urinary incontinence    Urticaria     Patient Active Problem List   Diagnosis Date Noted   Healthcare maintenance 01/28/2021   Hyperlipidemia 11/22/2019   UTI (urinary tract infection) 11/22/2019   History of 2019 novel coronavirus disease (COVID-19) 05/11/2019   COVID-19 04/30/2019   Adnexal pain 09/06/2018   Rash 05/11/2018   Paroxysmal atrial fibrillation ([place]) 08/24/2017   Dyspepsia 08/24/2017   Knee pain, left 08/16/2016   Back pain 06/23/2016   Urticaria 07/01/2015   Asthma with COPD  03/04/2015   Allergic rhinitis with nonallergic component 03/04/2015   COPD (chronic obstructive pulmonary disease) ([place]) 06/19/2014   Blood pressure elevated without history of HTN 03/05/2014   HNP (herniated nucleus pulposus), lumbar 04/16/2013   Tenosynovitis, de Quervain 11/15/2012   Morbid obesity with BMI of 45.0-49.9, adult ([place]) 09/08/2012   Carpal tunnel syndrome 06/23/2011   Sleep apnea 09/02/2010   GERD 12/24/2009   Urticaria/angioedema 12/16/2009   STRESS INCONTINENCE 01/24/2007   History of peptic ulcer disease 07/26/2006    Past Surgical History:  Procedure Laterality Date   BREAST EXCISIONAL BIOPSY Right 1994   MANDIBLE SURGERY     due to trauma   TUBAL LIGATION      OB History   No obstetric history on file.      Home Medications    Prior to Admission medications   Medication Sig Start Date End Date Taking? Authorizing Provider  predniSONE (DELTASONE) 20 MG tablet Take 2 tablets (40 mg total) by mouth daily with breakfast for 5 days. 05/25/21 05/30/21 Yes Francene Finders, PA-C  albuterol (PROVENTIL) (2.5 MG/3ML) 0.083% nebulizer solution Use 1 vial in nebulizer up to 3 times a day when albuterol inhaler not working. If no better or getting worse, call 911 or physician line 02/27/18   Bobbitt, [place] Muta, MD  Budeson-Glycopyrrol-Formoterol (BREZTRI AEROSPHERE) 160-9-4.8  MCG/ACT AERO Inhale 2 puffs into the lungs 2 (two) times daily. 10/30/20   Althea Charon, FNP  cetirizine (ZYRTEC) 10 MG tablet Take 1 tablet (10 mg total) by mouth daily. 11/27/20   Althea Charon, FNP  EPINEPHrine (EPIPEN 2-PAK) 0.3 mg/0.3 mL IJ SOAJ injection Inject 0.3 mg into the muscle as needed (for allergic reaction). 05/02/20   Althea Charon, FNP  levocetirizine (XYZAL) 5 MG tablet Take 1 tablet (5 mg total) by mouth every evening. 03/11/21   Althea Charon, FNP  pantoprazole (PROTONIX) 40 MG tablet Take 1 tablet by mouth once daily 11/18/20   Madalyn Rob, MD  [facility] HFA 108 [phone] Base)  MCG/ACT inhaler Inhale 2 puffs into the lungs every 4 (four) hours as needed for wheezing or shortness of breath. 06/06/20   Althea Charon, FNP    Family History Family History  Problem Relation Age of Onset   Diabetes Mother    Coronary artery disease Mother 54       requiring 5 vessel CABG   Diabetes Sister    Breast cancer Sister 80       died 2/2 breast ca age 30-60    Social History Social History   Tobacco Use   Smoking status: Former    Packs/day: 1.00    Years: 30.00    Pack years: 30.00    Types: Cigarettes    Quit date: 12/04/2010    Years since quitting: 10.4   Smokeless tobacco: Never  Vaping Use   Vaping Use: Never used  Substance Use Topics   Alcohol use: Yes    Alcohol/week: 0.0 standard drinks    Comment: now drinking a beer on weekends //    Drug use: No     Allergies   Bee venom, Aspirin, and Other   Review of Systems Review of Systems  Constitutional:  Negative for chills and fever.  HENT:  Positive for facial swelling. Negative for drooling and trouble swallowing.   Eyes:  Negative for discharge and redness.  Respiratory:  Negative for shortness of breath and wheezing.   Gastrointestinal:  Negative for nausea and vomiting.  Skin:  Positive for rash.    Physical Exam Triage Vital Signs ED Triage Vitals  Enc Vitals Group     BP      Pulse      Resp      Temp      Temp src      SpO2      Weight      Height      Head Circumference      Peak Flow      Pain Score      Pain Loc      Pain Edu?      Excl. in [place]?    No data found.  Updated Vital Signs BP 132/82 (BP Location: Left Arm)    Pulse 85    Temp 98.4 F (36.9 C) (Oral)    Resp 16    LMP 04/19/2004 (Approximate)    SpO2 93%   Physical Exam Vitals and nursing note reviewed.  Constitutional:      General: She is not in acute distress.    Appearance: Normal appearance. She is not ill-appearing.  HENT:     Head: Normocephalic and atraumatic.     Nose: Nose normal.      Mouth/Throat:     Mouth: Mucous membranes are moist.     Pharynx: Oropharynx is clear. No oropharyngeal exudate or posterior  oropharyngeal erythema.     Comments: Diffuse swelling noted to lower lip Eyes:     Conjunctiva/sclera: Conjunctivae normal.  Cardiovascular:     Rate and Rhythm: Normal rate and regular rhythm.     Heart sounds: Normal heart sounds. No murmur heard. Pulmonary:     Effort: Pulmonary effort is normal. No respiratory distress.     Breath sounds: Normal breath sounds. No wheezing, rhonchi or rales.  Skin:    General: Skin is warm and dry.     Findings: Rash (erythmatous excoriated rash to upper chest bilaterally) present.  Neurological:     Mental Status: She is alert.  Psychiatric:        Mood and Affect: Mood normal.        Thought Content: Thought content normal.     UC Treatments / Results  Labs (all labs ordered are listed, but only abnormal results are displayed) Labs Reviewed - No data to display  EKG   Radiology No results found.  Procedures Procedures (including critical care time)  Medications Ordered in UC Medications  methylPREDNISolone sodium succinate (SOLU-MEDROL) 125 mg/2 mL injection 80 mg (has no administration in time range)    Initial Impression / Assessment and Plan / UC Course  I have reviewed the triage vital signs and the nursing notes.  Pertinent labs & imaging results that were available during my care of the patient were reviewed by me and considered in my medical decision making (see chart for details).    It is unclear what has caused current reaction- will treat with steroid injection in office and oral steroids at home. Recommended close follow up with her allergist and strongly recommended she report to ED if symptoms fail to improve or worsen in any way. Patient expresses understanding.   Final Clinical Impressions(s) / UC Diagnoses   Final diagnoses:  Allergic reaction, initial encounter     Discharge  Instructions       Please follow up with your allergy specialist.   Report to Emergency Dept with any worsening symptoms or if symptoms fail to improve with treatment provided today.        ED Prescriptions     Medication Sig Dispense Auth. Provider   predniSONE (DELTASONE) 20 MG tablet Take 2 tablets (40 mg total) by mouth daily with breakfast for 5 days. 10 tablet Francene Finders, PA-C      PDMP not reviewed this encounter.   Francene Finders, PA-C 05/25/21 1038

## 2021-05-25 NOTE — ED Triage Notes (Signed)
Pt presents with c/o allergic reaction.   States Saturday night and Sunday morning she has had a rash and swelling on the L side of her face and states it moved to the R side.   States she has been itchy.   Pt states she does not know what she had a reaction to.   States she has taken Benadryl.

## 2021-05-25 NOTE — Discharge Instructions (Signed)
°  Please follow up with your allergy specialist.   Report to Emergency Dept with any worsening symptoms or if symptoms fail to improve with treatment provided today.

## 2021-05-26 ENCOUNTER — Telehealth: Payer: Self-pay | Admitting: Allergy

## 2021-05-26 NOTE — Telephone Encounter (Signed)
Patient called and said that she had some swelling for about a day and half, and had to go to the urgent care and they gave her some prednisone and told her contact [facility] // [address]. [phone].

## 2021-05-26 NOTE — Telephone Encounter (Signed)
Pt informed of this and will try the zyrtec bid and give [place] an update in a few days

## 2021-05-26 NOTE — Telephone Encounter (Signed)
Pt stated she had an allergic reaction after consuming spaghetti Saturday night but woke up Sunday with tingling in mouth took 2 benadryl and the tingling got worse and then left sie of face swelled, then lips started swelling and then right side of the face started swelling. Her mouth was in pain. She then had hives all over body, very red. She had 6 tablets of benadryl thru out Sunday and it never helped. She took tylenol Sunday night to help with the pain. Yesterday Monday is when she went to urgent care. They gave her steroid shot and prednisone tablets. She is feeling better now and swelling has gone down. Breathing was fine.

## 2021-06-08 ENCOUNTER — Other Ambulatory Visit: Payer: Self-pay | Admitting: Internal Medicine

## 2021-06-19 ENCOUNTER — Encounter: Payer: Self-pay | Admitting: Allergy

## 2021-06-19 ENCOUNTER — Ambulatory Visit (INDEPENDENT_AMBULATORY_CARE_PROVIDER_SITE_OTHER): Payer: Medicare Other | Admitting: Allergy

## 2021-06-19 ENCOUNTER — Other Ambulatory Visit: Payer: Self-pay

## 2021-06-19 VITALS — BP 112/70 | HR 91 | Temp 97.2°F | Resp 16 | Ht 60.0 in | Wt 236.0 lb

## 2021-06-19 DIAGNOSIS — J3089 Other allergic rhinitis: Secondary | ICD-10-CM

## 2021-06-19 DIAGNOSIS — T783XXD Angioneurotic edema, subsequent encounter: Secondary | ICD-10-CM | POA: Diagnosis not present

## 2021-06-19 DIAGNOSIS — L501 Idiopathic urticaria: Secondary | ICD-10-CM

## 2021-06-19 DIAGNOSIS — K219 Gastro-esophageal reflux disease without esophagitis: Secondary | ICD-10-CM | POA: Diagnosis not present

## 2021-06-19 DIAGNOSIS — J4489 Other specified chronic obstructive pulmonary disease: Secondary | ICD-10-CM

## 2021-06-19 DIAGNOSIS — J449 Chronic obstructive pulmonary disease, unspecified: Secondary | ICD-10-CM

## 2021-06-19 MED ORDER — PREDNISONE 10 MG PO TABS: ORAL_TABLET | ORAL | 0 refills | Status: AC

## 2021-06-19 MED ORDER — ALBUTEROL SULFATE HFA 108 (90 BASE) MCG/ACT IN AERS: 2.0000 | INHALATION_SPRAY | Freq: Four times a day (QID) | RESPIRATORY_TRACT | 1 refills | Status: DC | PRN

## 2021-06-19 MED ORDER — ARNUITY ELLIPTA 100 MCG/ACT IN AEPB: 1.0000 | INHALATION_SPRAY | Freq: Every day | RESPIRATORY_TRACT | 5 refills | Status: DC

## 2021-06-19 NOTE — Progress Notes (Signed)
? ? ?Follow-up Note ? ?RE: Maria Nelson MRN: [id number] DOB: 1956/01/01 ?Date of Office Visit: 06/19/2021 ? ? ?History of present illness: ?Maria Nelson is a 66 y.o. female presenting today for follow-up of asthma/copd overlap, chronic urticaria, allergic rhinitis and reflux.  She was last seen in the office on 12/19/2020 by myself. ? ?She states this morning she had an allergic reaction involving her tongue and states its been tingling like it is about to swell but hasn't.  She denies difficulty swallowing or breathing with this.   There was nothing unusual about her morning and she woke up with tingling.  For dinner she recalls having cubed steak and gravy, collards and mashed potatoes.  She states the greens were spicy and she is not sure if the spice it what triggered the tingling or something else.   ? ?Last month she had facial swelling where she did go to UC.  She also had hives and was "itching like crazy".  This occurred on 05/25/2021.  On exam she was noted to have a rash "erythematous excoriated rash to the upper chest bilaterally".  She was treated with Solu-Medrol injection. ?She does recall eating spaghetti with meat sauce prior to onset of the symptoms. ? ?She feels short of breath lately for past 2 months or so.    ?She states she goes to gym 3 days a week to walk and bear aerobics.   ?She states walking from front to back of house, making up her bed are activities that she gets out of breath with now.  She is using albuterol inhaler about once a day and denies using nebulizer use.   ?She denies any illnesses.  No fevers, sweats or chills.  ?She is taking Breztri 2 puffs twice a day.  He states when she did get the steroids last month related to her swelling that her breathing did improve. ? ?She takes zyrtec daily for allergy symptom control.  She states her allergies have been good and hasn't flared yet.  She was advised to take zyrtec twice a day after the facial  swelling episodes but she did not have enough supply to do this.  She states she recently got a refill of this that she is able to do twice a day dosing at this time. ? ?Review of systems: ?Review of Systems  ?Constitutional: Negative.   ?HENT: Negative.    ?Eyes: Negative.   ?Respiratory:  Positive for shortness of breath.   ?Cardiovascular: Negative.   ?Gastrointestinal: Negative.   ?Musculoskeletal: Negative.   ?Skin: Negative.   ?     See HPI  ?Allergic/Immunologic: Negative.   ?Neurological: Negative.    ? ?All other systems negative unless noted above in HPI ? ?Past medical/social/surgical/family history have been reviewed and are unchanged unless specifically indicated below. ? ?No changes ? ?Medication List: ?Current Outpatient Medications  ?Medication Sig Dispense Refill  ? albuterol (PROVENTIL) (2.5 MG/3ML) 0.083% nebulizer solution Use 1 vial in nebulizer up to 3 times a day when albuterol inhaler not working. If no better or getting worse, call 911 or physician line 75 mL 3  ? Budeson-Glycopyrrol-Formoterol (BREZTRI AEROSPHERE) 160-9-4.8 MCG/ACT AERO Inhale 2 puffs into the lungs 2 (two) times daily. 32.1 g 1  ? cetirizine (ZYRTEC) 10 MG tablet Take 1 tablet (10 mg total) by mouth daily. 90 tablet 1  ? EPINEPHrine (EPIPEN 2-PAK) 0.3 mg/0.3 mL IJ SOAJ injection Inject 0.3 mg into the muscle as needed (for allergic reaction). 1 each  0  ? Fluticasone Furoate (ARNUITY ELLIPTA) 100 MCG/ACT AEPB Inhale 1 puff into the lungs daily. 30 each 5  ? levocetirizine (XYZAL) 5 MG tablet Take 1 tablet (5 mg total) by mouth every evening. 30 tablet 5  ? pantoprazole (PROTONIX) 40 MG tablet Take 1 tablet by mouth once daily 90 tablet 1  ? predniSONE (DELTASONE) 10 MG tablet Day 1: Take 2 tablets at lunch and 2 in the evening. Day 2 & 3: Take 2 tablets @ breakfast and 2 tablets @ lunch. Day 4: 2 tablets @ breakfast. Day 5: Take 1 tablet @ breakfast 15 tablet 0  ? albuterol (VENTOLIN HFA) 108 (90 Base) MCG/ACT inhaler  Inhale 2 puffs into the lungs every 6 (six) hours as needed for wheezing or shortness of breath. 18 g 1  ? ?No current facility-administered medications for this visit.  ?  ? ?Known medication allergies: ?Allergies  ?Allergen Reactions  ? Bee Venom Anaphylaxis  ? Aspirin Nausea Only  ? Other Rash  ?  EKG pads cause burning sensation as soon as applied  ? ? ? ?Physical examination: ?Blood pressure 112/70, pulse 91, temperature (!) 97.2 ?F (36.2 ?C), temperature source Temporal, resp. rate 16, height 5' (1.524 m), weight 236 lb (107 kg), last menstrual period 04/19/2004, SpO2 94 %. ? ?General: Alert, interactive, in no acute distress. ?HEENT: PERRLA, TMs pearly gray, turbinates non-edematous without discharge, post-pharynx non erythematous. ?Neck: Supple without lymphadenopathy. ?Lungs: Clear to auscultation without wheezing, rhonchi or rales. {no increased work of breathing. ?CV: Normal S1, S2 without murmurs. ?Abdomen: Nondistended, nontender. ?Skin: Warm and dry, without lesions or rashes. ?Extremities:  No clubbing, cyanosis or edema. ?Neuro:   Grossly intact. ? ?Diagnositics/Labs: ?ACT score 16 -not under good control ? ?Spirometry: FEV1: 0.91 L 57%, FVC: 1.4 L 68%, ratio consistent with obstructive with restrictive pattern.  This is however quite stable from previous studies ? ?Assessment and plan: ?  ?Asthma/COPD ?At this time not well controlled ?Continue Breztri 2 puffs twice a day with spacer to help prevent cough and wheeze ?Start Arnuity 13mcg  1 puff once a day.   This is a pure steroid inhaler to provide more steroid effect to the airway for better control ?For current shortness of breath symptoms right now take Prednisone 5 day burst as directed  ?Continue albuterol 2 puffs every 4 hours as needed for cough or wheeze OR albuterol via nebulizer 1 unit dose every 4-6 hours as needed for cough, wheeze, tightness in chest, or shortness of breath. ?You may use albuterol 2 puffs 5-15 minutes before  activity ?I have ordered CBC today to assess degree of eosinophils you may have that can drive asthma symptoms ? ?Chronic hives and swelling ?Continuine cetirizine 10 mg twice a day at this time ?I have ordered lab work for red meat allergy as well as tryptase level ? ?Allergic rhinitis ?Use azelastine 2 sprays twice a day as needed for a runny nose ?Continue cetirizine as listed above ?Consider saline nasal rinses as needed for nasal symptoms. Use this before any medicated nasal sprays for best result ? ?Reflux ?Continue Pantoprazole 40 mg once a day as previously prescribed.  ?Continue dietary and lifestyle modifications as listed below ? ?Call the clinic if this treatment plan is not working well for you ? ? Schedule a follow up appointment in 3 months or sooner if needed ? ?I appreciate the opportunity to take part in Chidinma's care. Please do not hesitate to contact me with questions. ? ?  Sincerely, ? ? ?Prudy Feeler, MD ?Allergy/Immunology ?[facility] ? ? ?

## 2021-06-19 NOTE — Patient Instructions (Addendum)
Asthma/COPD ?At this time not well controlled ?Continue Breztri 2 puffs twice a day with spacer to help prevent cough and wheeze ?Start Arnuity 161mcg  1 puff once a day.   This is a pure steroid inhaler to provide more steroid effect to the airway for better control ?For current shortness of breath symptoms right now take Prednisone 5 day burst as directed  ?Continue albuterol 2 puffs every 4 hours as needed for cough or wheeze OR albuterol via nebulizer 1 unit dose every 4-6 hours as needed for cough, wheeze, tightness in chest, or shortness of breath. ?You may use albuterol 2 puffs 5-15 minutes before activity ?I have ordered CBC today to assess degree of eosinophils you may have that can drive asthma symptoms ? ?Chronic hives and swelling ?Continuine cetirizine 10 mg twice a day at this time ?I have ordered lab work for red meat allergy as well as tryptase level ? ?Allergic rhinitis ?Use azelastine 2 sprays twice a day as needed for a runny nose ?Continue cetirizine as listed above ?Consider saline nasal rinses as needed for nasal symptoms. Use this before any medicated nasal sprays for best result ? ?Reflux ?Continue Pantoprazole 40 mg once a day as previously prescribed.  ?Continue dietary and lifestyle modifications as listed below ? ?Call the clinic if this treatment plan is not working well for you ? ? Schedule a follow up appointment in 3 months or sooner if needed ? ? ? ?

## 2021-06-23 LAB — ALPHA-GAL PANEL
Allergen Lamb IgE: <0.1 kU/L
Beef IgE: <0.1 kU/L
IgE (Immunoglobulin E), Serum: 758 IU/mL — ABNORMAL HIGH (ref 6–495)
O215-IgE Alpha-Gal: <0.1 kU/L
Pork IgE: <0.1 kU/L

## 2021-06-23 LAB — CBC WITH DIFFERENTIAL
Basophils Absolute: 0.1 10*3/uL (ref 0.0–0.2)
Basos: 1 %
EOS (ABSOLUTE): 0.5 10*3/uL — ABNORMAL HIGH (ref 0.0–0.4)
Eos: 6 %
Hematocrit: 43.9 % (ref 34.0–46.6)
Hemoglobin: 13.8 g/dL (ref 11.1–15.9)
Immature Grans (Abs): 0 10*3/uL (ref 0.0–0.1)
Immature Granulocytes: 0 %
Lymphocytes Absolute: 2.6 10*3/uL (ref 0.7–3.1)
Lymphs: 30 %
MCH: 24.7 pg — ABNORMAL LOW (ref 26.6–33.0)
MCHC: 31.4 g/dL — ABNORMAL LOW (ref 31.5–35.7)
MCV: 79 fL (ref 79–97)
Monocytes Absolute: 0.6 10*3/uL (ref 0.1–0.9)
Monocytes: 7 %
Neutrophils Absolute: 4.8 10*3/uL (ref 1.4–7.0)
Neutrophils: 56 %
RBC: 5.58 x10E6/uL — ABNORMAL HIGH (ref 3.77–5.28)
RDW: 13.8 % (ref 11.7–15.4)
WBC: 8.6 10*3/uL (ref 3.4–10.8)

## 2021-06-23 LAB — TRYPTASE: Tryptase: 7.7 ug/L (ref 2.2–13.2)

## 2021-07-01 ENCOUNTER — Encounter: Payer: Medicare Other | Admitting: Internal Medicine

## 2021-07-13 ENCOUNTER — Ambulatory Visit (INDEPENDENT_AMBULATORY_CARE_PROVIDER_SITE_OTHER): Payer: Medicare Other | Admitting: Internal Medicine

## 2021-07-13 VITALS — BP 130/77 | HR 67 | Temp 98.4°F | Wt 262.3 lb

## 2021-07-13 DIAGNOSIS — Z1211 Encounter for screening for malignant neoplasm of colon: Secondary | ICD-10-CM

## 2021-07-13 DIAGNOSIS — Z6841 Body Mass Index (BMI) 40.0 and over, adult: Secondary | ICD-10-CM | POA: Diagnosis not present

## 2021-07-13 DIAGNOSIS — M25512 Pain in left shoulder: Secondary | ICD-10-CM

## 2021-07-13 DIAGNOSIS — Z23 Encounter for immunization: Secondary | ICD-10-CM

## 2021-07-13 DIAGNOSIS — R7303 Prediabetes: Secondary | ICD-10-CM | POA: Diagnosis not present

## 2021-07-13 DIAGNOSIS — G8929 Other chronic pain: Secondary | ICD-10-CM

## 2021-07-13 DIAGNOSIS — M5442 Lumbago with sciatica, left side: Secondary | ICD-10-CM

## 2021-07-13 LAB — POCT GLYCOSYLATED HEMOGLOBIN (HGB A1C): Hemoglobin A1C: 5.3 % (ref 4.0–5.6)

## 2021-07-13 LAB — GLUCOSE, CAPILLARY: Glucose-Capillary: 99 mg/dL (ref 70–99)

## 2021-07-13 MED ORDER — SHINGRIX 50 MCG/0.5ML IM SUSR: 0.5000 mL | Freq: Once | INTRAMUSCULAR | 1 refills | Status: AC

## 2021-07-13 NOTE — Patient Instructions (Signed)
Thank you for trusting me with your care. To recap, today we discussed the following: ? ? ?1. Chronic left shoulder pain ? ?- BMP8+Anion Gap ?- MR SHOULDER LEFT W WO CONTRAST ? ?2. Chronic left-sided low back pain with left-sided sciatica ?- DG Lumbar Spine Complete ? ?3. Encounter for screening colonoscopy ?- Ambulatory referral to Gastroenterology ? ?4. Prediabetes ?- POC Hbg A1C ? ?5. Need for diphtheria-tetanus-pertussis (Tdap) vaccine ? ? ?6. Vaccine for streptococcus pneumoniae and influenza ?- Pneumococcal conjugate vaccine 20-valent (Prevnar 20) ? ? ?

## 2021-07-13 NOTE — Progress Notes (Signed)
? ?  CC: left shoulder pain , low back pain with left sided sciatica, Morbid obesity, need for screening colonoscopy, need for Zoster vaccine and Streptococcus pneumonia vaccine ? ?HPI:[name] is a 66 y.o. female who presents for evaluation of left shoulder pain , low back pain with left sided sciatica, Prediabetes, need for screening colonoscopy, need for TDAP vaccine and Streptococcus pneumonia vaccine. Please see individual problem based A/P for details. ? ?Past Medical History:  ?Diagnosis Date  ? ANGIOEDEMA 12/16/2009  ? Asthma   ? Carpal tunnel syndrome 06/23/2011  ? Patient notes history of bilateral carpal tunnel syndrome.  Now has symptoms of right hand carpal tunnel.   ? COPD (chronic obstructive pulmonary disease) ([place])   ? secondary to tobacco use // No PFTs on file  ? COVID-19   ? ETOH abuse   ? Pt stopped in 3/08  ? Gastrointestinal hemorrhage   ? secondary to PUD on March 2008  ? OSA on CPAP   ? noncompliant with CPAP (2/2 it being depressing)  ? Peptic ulcer disease   ? +H. pylori (antigen)- Dr. Olevia Perches- treated  ? STRESS INCONTINENCE 01/24/2007  ? Tobacco abuse   ? quit in 2010  ? Urinary incontinence   ? Urticaria   ? ?Review of Systems:   ?Review of Systems  ?Constitutional:  Negative for chills, fever and weight loss.  ?Musculoskeletal:  Positive for back pain, falls and joint pain.   ? ?Physical Exam: ?Vitals:  ? 07/13/21 1445  ?BP: 130/77  ?Pulse: 67  ?Temp: 98.4 ?F (36.9 ?C)  ?TempSrc: Oral  ?SpO2: 93%  ?Weight: 262 lb 4.8 oz (119 kg)  ? ? ? ?General: Obese, NAD ?Cardiovascular: Normal rate, regular rhythm.  No murmurs, rubs, or gallops ?Pulmonary : Equal breath sounds, No wheezes, rales, or rhonchi ?Back: No gross deformity, scoliosis., No midline or bony TTP.Strength LEs 5/5 all muscle groups.  ,Sensation intact to light touch bilaterally. ?Shoulder:Left  ?No obvious deformity or asymmetry. No bruising. No swelling ?No TTP ?Abduction limited by pain, FROM with passive  movements  ?NV intact distally ?Special Tests:  ?- Impingement: Pos Hawkins.  ?- Supraspinatus: Negative empty can.  5/5 strength ?- Infraspinatus/Teres: 5/5 strength with ER ?- Subscapularis: negative belly press, negative bear hug. 5/5 strength with IR ?- Biceps tendon: Negative Speeds.  ?- AC Joint: Positive cross arm ? ? ?Assessment & Plan:  ? ?See Encounters Tab for problem based charting. ? ?Patient discussed with Dr. Daryll Drown ? ?

## 2021-07-14 ENCOUNTER — Encounter: Payer: Self-pay | Admitting: Internal Medicine

## 2021-07-14 DIAGNOSIS — Z23 Encounter for immunization: Secondary | ICD-10-CM | POA: Insufficient documentation

## 2021-07-14 DIAGNOSIS — Z1211 Encounter for screening for malignant neoplasm of colon: Secondary | ICD-10-CM | POA: Insufficient documentation

## 2021-07-14 LAB — BMP8+ANION GAP
Anion Gap: 14 mmol/L (ref 10.0–18.0)
BUN/Creatinine Ratio: 8 — ABNORMAL LOW (ref 12–28)
BUN: 9 mg/dL (ref 8–27)
CO2: 23 mmol/L (ref 20–29)
Calcium: 9.8 mg/dL (ref 8.7–10.3)
Chloride: 104 mmol/L (ref 96–106)
Creatinine, Ser: 1.16 mg/dL — ABNORMAL HIGH (ref 0.57–1.00)
Glucose: 76 mg/dL (ref 70–99)
Potassium: 4.4 mmol/L (ref 3.5–5.2)
Sodium: 141 mmol/L (ref 134–144)
eGFR: 52 mL/min/{1.73_m2} — ABNORMAL LOW (ref >59)

## 2021-07-14 NOTE — Assessment & Plan Note (Signed)
-   Referral for screening colonoscopy ?

## 2021-07-14 NOTE — Assessment & Plan Note (Signed)
Patient with previous history of left shoulder. She reports acute shoulder pain started in October after a fall. She has decreased strength with reaching over her head and pain with this motion. The pain is located anterior lateral shoulder.  ? ?A/P: Left Shoulder pain, expect a rotator cuff tendinopathy.Further evaluate with MRI.  ?- MRI left shoulder ?- continue tylenol for now, history of peptic ulcer disease.  ?

## 2021-07-14 NOTE — Assessment & Plan Note (Signed)
-   Zoster Vaccine Adjuvanted [facility]) injection; Inject 0.5 mLs into the muscle once for 1 dose.  Dispense: 0.5 mL; Refill: 1 ?

## 2021-07-14 NOTE — Assessment & Plan Note (Signed)
Acute on chronic back pain. Left sided back pain started after fall in October. Patient has sciatica on the left side. Symptoms go away and comeback. Often worse in the morning and improves. , suggest osteoarthritis. No red flag symptoms. Given fall will send for xray of lumbar spine. Based on result will prescribe additional therapy.  ?

## 2021-07-14 NOTE — Assessment & Plan Note (Signed)
-   Pneumococcal conjugate vaccine 20-valent (Prevnar 20) ?

## 2021-07-17 ENCOUNTER — Ambulatory Visit
Admission: RE | Admit: 2021-07-17 | Discharge: 2021-07-17 | Disposition: A | Discharge status: Home / Self Care | Payer: Medicare Other | Source: Ambulatory Visit | Attending: Internal Medicine | Admitting: Internal Medicine

## 2021-07-17 DIAGNOSIS — Z780 Asymptomatic menopausal state: Secondary | ICD-10-CM | POA: Diagnosis not present

## 2021-07-17 DIAGNOSIS — Z13820 Encounter for screening for osteoporosis: Secondary | ICD-10-CM

## 2021-08-03 ENCOUNTER — Ambulatory Visit (INDEPENDENT_AMBULATORY_CARE_PROVIDER_SITE_OTHER): Payer: Medicare Other | Admitting: Internal Medicine

## 2021-08-03 ENCOUNTER — Telehealth: Payer: Self-pay

## 2021-08-03 ENCOUNTER — Encounter: Payer: Self-pay | Admitting: Internal Medicine

## 2021-08-03 DIAGNOSIS — Z0000 Encounter for general adult medical examination without abnormal findings: Secondary | ICD-10-CM

## 2021-08-03 NOTE — Telephone Encounter (Signed)
Completed AWV with patient today, patient is requesting a referral to a ophthalmologists and to a dental office. ?

## 2021-08-03 NOTE — Telephone Encounter (Signed)
Patient is also requesting a handicap placard ?

## 2021-08-03 NOTE — Progress Notes (Signed)
? ?Subjective:  ? Maria Nelson is a 66 y.o. female who presents for an Initial Medicare Annual Wellness Visit. ?I connected with  Seraya Jobst on 08/03/21 by a audio enabled telemedicine application and verified that I am speaking with the correct person using two identifiers. ? ?Patient Location: Home ? ?Provider Location: Office/Clinic ? ?I discussed the limitations of evaluation and management by telemedicine. The patient expressed understanding and agreed to proceed.  ?Review of Systems    ?Defer to PCP ?  ? ?   ?Objective:  ?  ?Today's Vitals  ? 08/03/21 1416  ?PainSc: 7   ? ?There is no height or weight on file to calculate BMI. ? ? ?  08/03/2021  ?  2:29 PM 07/13/2021  ?  2:51 PM 11/19/2019  ?  4:02 PM 08/22/2019  ?  6:06 PM 05/01/2019  ?  5:00 AM 02/21/2019  ?  4:20 PM 09/06/2018  ?  1:51 PM  ?Advanced Directives  ?Does Patient Have a Medical Advance Directive? No No No No No No No  ?Would patient like information on creating a medical advance directive? No - Patient declined No - Patient declined No - Patient declined  No - Patient declined No - Patient declined No - Patient declined  ? ? ?Current Medications (verified) ?Outpatient Encounter Medications as of 08/03/2021  ?Medication Sig  ? albuterol (PROVENTIL) (2.5 MG/3ML) 0.083% nebulizer solution Use 1 vial in nebulizer up to 3 times a day when albuterol inhaler not working. If no better or getting worse, call 911 or physician line  ? albuterol (VENTOLIN HFA) 108 (90 Base) MCG/ACT inhaler Inhale 2 puffs into the lungs every 6 (six) hours as needed for wheezing or shortness of breath.  ? Budeson-Glycopyrrol-Formoterol (BREZTRI AEROSPHERE) 160-9-4.8 MCG/ACT AERO Inhale 2 puffs into the lungs 2 (two) times daily.  ? cetirizine (ZYRTEC) 10 MG tablet Take 1 tablet (10 mg total) by mouth daily.  ? Fluticasone Furoate (ARNUITY ELLIPTA) 100 MCG/ACT AEPB Inhale 1 puff into the lungs daily.  ? levocetirizine (XYZAL) 5 MG tablet Take 1 tablet  (5 mg total) by mouth every evening.  ? pantoprazole (PROTONIX) 40 MG tablet Take 1 tablet by mouth once daily  ? EPINEPHrine (EPIPEN 2-PAK) 0.3 mg/0.3 mL IJ SOAJ injection Inject 0.3 mg into the muscle as needed (for allergic reaction). (Patient not taking: Reported on 08/03/2021)  ? ?No facility-administered encounter medications on file as of 08/03/2021.  ? ? ?Allergies (verified) ?Bee venom, Aspirin, and Other  ? ?History: ?Past Medical History:  ?Diagnosis Date  ? ANGIOEDEMA 12/16/2009  ? Asthma   ? Carpal tunnel syndrome 06/23/2011  ? Patient notes history of bilateral carpal tunnel syndrome.  Now has symptoms of right hand carpal tunnel.   ? COPD (chronic obstructive pulmonary disease) ([place])   ? secondary to tobacco use // No PFTs on file  ? COVID-19   ? ETOH abuse   ? Pt stopped in 3/08  ? Gastrointestinal hemorrhage   ? secondary to PUD on March 2008  ? OSA on CPAP   ? noncompliant with CPAP (2/2 it being depressing)  ? Peptic ulcer disease   ? +H. pylori (antigen)- Dr. Olevia Perches- treated  ? STRESS INCONTINENCE 01/24/2007  ? Tobacco abuse   ? quit in 2010  ? Urinary incontinence   ? Urticaria   ? ?Past Surgical History:  ?Procedure Laterality Date  ? BREAST EXCISIONAL BIOPSY Right 1994  ? MANDIBLE SURGERY    ? due to trauma  ?  TUBAL LIGATION    ? ?Family History  ?Problem Relation Age of Onset  ? Diabetes Mother   ? Coronary artery disease Mother 47  ?     requiring 5 vessel CABG  ? Diabetes Sister   ? Breast cancer Sister 68  ?     died 2/2 breast ca age 66-60  ? ?Social History  ? ?Socioeconomic History  ? Marital status: Single  ?  Spouse name: Not on file  ? Number of children: 3  ? Years of education: 12th grade  ? Highest education level: Not on file  ?Occupational History  ? Occupation: Disability  ?  Comment: previously worked in [employer] had to stop 2/2 occupational exposures leading to exacerbations of asthma   ?Tobacco Use  ? Smoking status: Former  ?  Packs/day: 1.00  ?  Years: 30.00  ?  Pack years:  30.00  ?  Types: Cigarettes  ?  Quit date: 12/04/2010  ?  Years since quitting: 10.6  ? Smokeless tobacco: Never  ?Vaping Use  ? Vaping Use: Never used  ?Substance and Sexual Activity  ? Alcohol use: Not Currently  ?  Alcohol/week: 2.0 standard drinks  ?  Types: 1 Glasses of wine, 1 Cans of beer per week  ?  Comment: 1-2 glasses per week, beer 1 per week  ? Drug use: No  ? Sexual activity: Not Currently  ?Other Topics Concern  ? Not on file  ?Social History Narrative  ? Pt is not working now, was previously a Scientist, water quality at [employer].  ?   ? Pt lives with her son  ?   ? Pt has received financial assistance approval for 100% discount at [facility] and has [facility] card.   ? ?Social Determinants of Health  ? ?Financial Resource Strain: Low Risk   ? Difficulty of Paying Living Expenses: Not hard at all  ?Food Insecurity: No Food Insecurity  ? Worried About Charity fundraiser in the Last Year: Never true  ? Ran Out of Food in the Last Year: Never true  ?Transportation Needs: No Transportation Needs  ? Lack of Transportation (Medical): No  ? Lack of Transportation (Non-Medical): No  ?Physical Activity: Sufficiently Active  ? Days of Exercise per Week: 3 days  ? Minutes of Exercise per Session: 90 min  ?Stress: No Stress Concern Present  ? Feeling of Stress : Not at all  ?Social Connections: Moderately Isolated  ? Frequency of Communication with Friends and Family: More than three times a week  ? Frequency of Social Gatherings with Friends and Family: Once a week  ? Attends Religious Services: More than 4 times per year  ? Active Member of Clubs or Organizations: No  ? Attends Archivist Meetings: Never  ? Marital Status: Divorced  ? ? ?Tobacco Counseling ?Counseling given: Not Answered ? ? ?Clinical Intake: ? ?Pre-visit preparation completed: Yes ? ?Pain : 0-10 ?Pain Score: 7  ?Pain Type: Chronic pain ?Pain Location: Back ?Pain Orientation: Lower, Left ?Pain Descriptors / Indicators: Throbbing ?Pain Onset:  More than a month ago ?Pain Frequency: Constant ?Pain Relieving Factors: tylenol ?Effect of Pain on Daily Activities: pain when lifting left arm , pain at night ? ?Pain Relieving Factors: tylenol ? ?Nutritional Risks: None ?Diabetes: No ? ?How often do you need to have someone help you when you read instructions, pamphlets, or other written materials from your doctor or pharmacy?: 1 - Never ?What is the last grade level you  completed in school?: 12th grade ? ?Diabetic?No ? ?  ? ?Information entered by :: Midge Minium 08/03/2021 2:19pm ? ? ?Activities of Daily Living ? ?  08/03/2021  ?  2:28 PM 07/13/2021  ?  2:51 PM  ?In your present state of health, do you have any difficulty performing the following activities:  ?Hearing? 0 0  ?Vision? 1 1  ?Difficulty concentrating or making decisions? 0 0  ?Walking or climbing stairs? 0 0  ?Dressing or bathing? 0 0  ?Doing errands, shopping? 0 0  ? ? ?Patient Care Team: ?Madalyn Rob, MD as PCP - General ? ?Indicate any recent Medical Services you may have received from other than [facility] providers in the past year (date may be approximate). ? ?   ?Assessment:  ? This is a routine wellness examination for [place]. ? ?Hearing/Vision screen ?No results found. ? ?Dietary issues and exercise activities discussed: ?  ? ? Goals Addressed   ?None ?  ?Depression Screen ? ?  08/03/2021  ?  2:23 PM 07/13/2021  ?  2:55 PM 01/28/2021  ?  4:41 PM 02/21/2019  ?  4:21 PM 09/06/2018  ?  2:30 PM 05/10/2018  ?  2:01 PM 09/19/2017  ?  2:31 PM  ?PHQ 2/9 Scores  ?PHQ - 2 Score 0 0 0 0 0 0 0  ?PHQ- 9 Score 0 0 1 0 0 0   ?  ?Fall Risk ? ?  08/03/2021  ?  2:28 PM 07/13/2021  ?  2:50 PM 01/28/2021  ?  2:47 PM 11/19/2019  ?  4:04 PM 02/21/2019  ?  4:21 PM  ?Fall Risk   ?Falls in the past year? '1 1 1 '$ 0 0  ?Comment   foot got caught in kitchen stool    ?Number falls in past yr: 0 0 0    ?Injury with Fall? 1 0 0    ?Comment   scraped left arm    ?Risk for fall due to : Impaired balance/gait  History of fall(s)     ?Follow up Falls evaluation completed;Falls prevention discussed Falls evaluation completed Falls evaluation completed    ? ? ?FALL RISK PREVENTION PERTAINING TO THE HOME: ? ?Any stairs in or around the home?

## 2021-08-03 NOTE — Progress Notes (Signed)
Internal Medicine Clinic Attending ° °Case discussed with Dr. Steen  at the time of the visit.  We reviewed the resident’s history and exam and pertinent patient test results.  I agree with the assessment, diagnosis, and plan of care documented in the resident’s note.  °

## 2021-08-04 ENCOUNTER — Other Ambulatory Visit: Payer: Self-pay | Admitting: Internal Medicine

## 2021-08-04 NOTE — Progress Notes (Signed)
I discussed the AWV findings with the provider who conducted the visit. I was present in the office suite and immediately available to provide assistance and direction throughout the time the service was provided.  ?

## 2021-08-05 ENCOUNTER — Telehealth: Payer: Self-pay | Admitting: Internal Medicine

## 2021-08-05 NOTE — Telephone Encounter (Signed)
Review results of bone density scan with patient. She does not have osteoporosis or osteopenia based on these results. ? ?She was unaware of MRI of shoulder scheduled 4/25. She will not be able to make appointment and will call to reschedule. She has not went for xray of her lumbar spine and she is going to follow tup rescheduling this as well.  ?

## 2021-08-11 ENCOUNTER — Other Ambulatory Visit (HOSPITAL_COMMUNITY): Payer: Medicare Other

## 2021-08-19 ENCOUNTER — Ambulatory Visit (HOSPITAL_COMMUNITY)
Admission: RE | Admit: 2021-08-19 | Discharge: 2021-08-19 | Disposition: A | Discharge status: Home / Self Care | Payer: Medicare Other | Source: Ambulatory Visit | Attending: Internal Medicine | Admitting: Internal Medicine

## 2021-08-19 ENCOUNTER — Other Ambulatory Visit: Payer: Self-pay | Admitting: Family

## 2021-08-19 DIAGNOSIS — M75102 Unspecified rotator cuff tear or rupture of left shoulder, not specified as traumatic: Secondary | ICD-10-CM | POA: Diagnosis not present

## 2021-08-19 DIAGNOSIS — G8929 Other chronic pain: Secondary | ICD-10-CM | POA: Insufficient documentation

## 2021-08-19 DIAGNOSIS — M25512 Pain in left shoulder: Secondary | ICD-10-CM | POA: Insufficient documentation

## 2021-08-19 MED ORDER — GADOBUTROL 1 MMOL/ML IV SOLN
10.0000 mL | Freq: Once | INTRAVENOUS | Status: AC | PRN
  Administered 2021-08-19: 10 mL via INTRAVENOUS

## 2021-08-24 ENCOUNTER — Telehealth: Payer: Self-pay | Admitting: Internal Medicine

## 2021-08-24 ENCOUNTER — Telehealth: Payer: Self-pay

## 2021-08-24 DIAGNOSIS — S46002A Unspecified injury of muscle(s) and tendon(s) of the rotator cuff of left shoulder, initial encounter: Secondary | ICD-10-CM

## 2021-08-24 MED ORDER — NAPROXEN 500 MG PO TABS: 500.0000 mg | ORAL_TABLET | Freq: Two times a day (BID) | ORAL | 0 refills | Status: AC

## 2021-08-24 NOTE — Telephone Encounter (Signed)
Called patient and review MRI of left shoulder. MRI showed tendinopathy of supraspinatus and infraspinatus. Small interstitial tear of lateral supraspinatus. Mild acromioclavicular osteoarthritis. Small volume of subacromial/subdeltoid fluid compatible with ?bursitis.She continues to have pain in left shoulder. Her history of peptic ulcer was in 2008. We discussed steroid injections or oral NSAID as treatment options. She would like to hold off on injection for now. I am placing referral for PT. She will call clinic to arrange steroid injections of shoulder if pain is not improving. Follow up in 2 months for reassessment.  ? ?Patient requested I call sister , Tye [place], and discuss results. I spoke to sister and shared results. ?

## 2021-08-24 NOTE — Telephone Encounter (Signed)
Pt is requesting a call back .. she is wanting to know if her results from her MRI came back as yet  ?

## 2021-09-02 ENCOUNTER — Other Ambulatory Visit: Payer: Self-pay

## 2021-09-02 ENCOUNTER — Encounter: Payer: Self-pay | Admitting: Internal Medicine

## 2021-09-02 ENCOUNTER — Telehealth: Payer: Self-pay | Admitting: *Deleted

## 2021-09-02 ENCOUNTER — Ambulatory Visit (INDEPENDENT_AMBULATORY_CARE_PROVIDER_SITE_OTHER): Payer: Medicare Other | Admitting: Internal Medicine

## 2021-09-02 VITALS — BP 132/85 | HR 64 | Temp 98.0°F | Ht 60.0 in | Wt 263.6 lb

## 2021-09-02 DIAGNOSIS — H8110 Benign paroxysmal vertigo, unspecified ear: Secondary | ICD-10-CM

## 2021-09-02 NOTE — Telephone Encounter (Signed)
If the dizziness has resolved, there is nothing more to do, ok to avoid naprosyn for now. If the dizziness is still present, then she should be evaluated ideally in [facility]. ?

## 2021-09-02 NOTE — Telephone Encounter (Signed)
RTC to patient is still dizzy when she tries to get up out of bed.  Given an appointment for 2:15pm. For assessment of the dizziness. ?

## 2021-09-02 NOTE — Telephone Encounter (Signed)
Call from patient stated that the Naprosyn is making her dizzy.   Did not have any dizziness prior to taking.  Took last pill on yesterday.  Advised not to take any more and to drink plenty of fluids.  Advised will contact  physician for next steps.[name] ?

## 2021-09-02 NOTE — Progress Notes (Signed)
   Office Visit   Patient ID: Maria Nelson, female    DOB: Apr 06, 1956, 66 y.o.   MRN: [id number]   PCP: Madalyn Rob, MD   Subjective:  CC: Dizziness (Pt stated that for a wk now she has been having dizzy spell.. she stated it happens when she is changing positions )   Maria Nelson is a 66 y.o. year old female who presents for the above medical condition(s). Please refer to problem based charting for assessment and plan.    Objective:   BP 132/85 (BP Location: Left Arm, Patient Position: Sitting, Cuff Size: Large)   Pulse 64   Temp 98 F (36.7 C) (Oral)   Ht 5' (1.524 m)   Wt 263 lb 9.6 oz (119.6 kg)   LMP 04/19/2004 (Approximate)   SpO2 96%   BMI 51.48 kg/m  BP- Lying: 122/87  Pulse- Lying: 63  BP- Sitting: 130/82  Pulse- Sitting: 63  BP- Standing at 0 minutes: 139/82  Pulse- Standing at 0 minutes: 69  BP- Standing at 3 minutes: 130/87  Pulse- Standing at 3 minutes: 69    General: well appearing, no distress Cardiac: RRR Neuro: PERRL. EOM intact. No facial droop. Tongue/uvula midline. 5/5 strength in bilateral upper and lower extremities. Extremity sensation intact. Finger to nose and heel to shin testing normal. Pt declined dix-hallpike testing Assessment & Plan:   Problem List Items Addressed This Visit       Nervous and Auditory   Benign paroxysmal positional vertigo - Primary    HPI: presenting for 2d history of positionally related vertigo. Symptoms began after turning over in bed. Vertigo resolves between position changes. Denies associated headache, nausea or focal neurologic changes.   Assessment: Symptoms most consistent with BPPV. She declined Dix-Hallpike testing today due to symptoms she experienced while undergoing orthostatic vitals.  Posterior cerebellar infarct unlikely--symptoms resolve between position changes Orthostatic vitals normal  Plan Suspected diagnosis, pathogenesis and treatment discussed with patient.  Limitations to diagnosis without dix-hallpike reviewed with her.   Referred to vestibular PT She is not experiencing nausea so will defer meclizine at this time Return precautions and cautions regarding driving discussed. Advised to be cautious while ambulating.       Relevant Orders   Ambulatory referral to Physical Therapy     Return if symptoms worsen or fail to improve.   Pt discussed with Dr. Forde Radon, MD Internal Medicine Resident PGY-3 [facility] 09/03/2021 10:20 AM

## 2021-09-03 ENCOUNTER — Encounter: Payer: Self-pay | Admitting: Internal Medicine

## 2021-09-03 DIAGNOSIS — H8110 Benign paroxysmal vertigo, unspecified ear: Secondary | ICD-10-CM | POA: Insufficient documentation

## 2021-09-03 NOTE — Assessment & Plan Note (Signed)
HPI: presenting for 2d history of positionally related vertigo. Symptoms began after turning over in bed. Vertigo resolves between position changes. Denies associated headache, nausea or focal neurologic changes.   Assessment: Symptoms most consistent with BPPV. She declined Dix-Hallpike testing today due to symptoms she experienced while undergoing orthostatic vitals.  Posterior cerebellar infarct unlikely--symptoms resolve between position changes Orthostatic vitals normal  Plan  Suspected diagnosis, pathogenesis and treatment discussed with patient. Limitations to diagnosis without dix-hallpike reviewed with her.    Referred to vestibular PT  She is not experiencing nausea so will defer meclizine at this time  Return precautions and cautions regarding driving discussed. Advised to be cautious while ambulating.

## 2021-09-08 NOTE — Therapy (Addendum)
OUTPATIENT PHYSICAL THERAPY CERVICAL EVALUATION   Patient Name: Maria Nelson MRN: [id number] DOB:[birth year], 66 y.o., female Today's Date: 09/08/2021    Past Medical History:  Diagnosis Date   ANGIOEDEMA 12/16/2009   Asthma    Carpal tunnel syndrome 06/23/2011   Patient notes history of bilateral carpal tunnel syndrome.  Now has symptoms of right hand carpal tunnel.    COPD (chronic obstructive pulmonary disease) ([place])    secondary to tobacco use // No PFTs on file   COVID-19    ETOH abuse    Pt stopped in 3/08   Gastrointestinal hemorrhage    secondary to PUD on March 2008   OSA on CPAP    noncompliant with CPAP (2/2 it being depressing)   Peptic ulcer disease    +H. pylori (antigen)- Dr. Olevia Perches- treated   STRESS INCONTINENCE 01/24/2007   Tobacco abuse    quit in 2010   Urinary incontinence    Urticaria    Past Surgical History:  Procedure Laterality Date   BREAST EXCISIONAL BIOPSY Right 1994   MANDIBLE SURGERY     due to trauma   TUBAL LIGATION     Patient Active Problem List   Diagnosis Date Noted   Benign paroxysmal positional vertigo 09/03/2021   Colon cancer screening 07/14/2021   Healthcare maintenance 01/28/2021   Hyperlipidemia 11/22/2019   UTI (urinary tract infection) 11/22/2019   Adnexal pain 09/06/2018   Rash 05/11/2018   Paroxysmal atrial fibrillation ([place]) 08/24/2017   Dyspepsia 08/24/2017   Knee pain, left 08/16/2016   Back pain 06/23/2016   Urticaria 07/01/2015   Asthma with COPD 03/04/2015   Allergic rhinitis with nonallergic component 03/04/2015   COPD (chronic obstructive pulmonary disease) ([place]) 06/19/2014   Blood pressure elevated without history of HTN 03/05/2014   HNP (herniated nucleus pulposus), lumbar 04/16/2013   Tenosynovitis, de Quervain 11/15/2012   Morbid obesity with BMI of 45.0-49.9, adult ([place]) 09/08/2012   Carpal tunnel syndrome 06/23/2011   Shoulder pain 09/02/2010   Sleep apnea 09/02/2010   GERD  12/24/2009   Urticaria/angioedema 12/16/2009   STRESS INCONTINENCE 01/24/2007   History of peptic ulcer disease 07/26/2006    PCP: Madalyn Rob, MD  REFERRING PROVIDER: Sid Falcon, MD  REFERRING DIAG: H81.10 (ICD-10-CM) - Benign paroxysmal positional vertigo, unspecified laterality  THERAPY DIAG:  No diagnosis found.  Rationale for Evaluation and Treatment Rehabilitation  ONSET DATE: shoulder pain: 01/2021, dizziness: 08/19/2021  SUBJECTIVE:  SUBJECTIVE STATEMENT: Pt reports primary c/o dizzy spells and Lt shoulder pain. experiencing a fall last October when tripping over a stool in her kitchen, resulting in her falling onto her Lt side. She reports that she lost consciousness for about five minutes and woke up with moderate-to-severe Lt shoulder pain. She was accompanied by her brother at that time, and he was able to help her. She reports not going immediately to the doctor, but about two months following the fall, she was seen by her PCM and later received a Lt shoulder MRI earlier this month. Her imaging is suggestive of mild-to-moderate supraspinatus and infraspinatus tendinopathy appearing worse in the supraspinatus. There is also a tiny interstitial tear of the far lateral supraspinatus but no full-thickness tendon tear or retraction.  She denies any N/T related to this problem. Her dizziness began shortly after her MRI earlier this month. Since that time, she reports feeling like her head is moving in slow motion. Her dizziness is most prevalent with rolling over in bed and bending over. Aggravating factors for her shoulder include lifting Lt arm overhead, laying on Lt arm, and lifting things. She reports that she is woken about every hour due to pain. Easing factors include rest. Current  pain is 7/10. Worst pain is 10/10. Best pain is 7/10. Pt denies any unexplained weight change, drop attacks, dysphagia, dysarthria, nausea/ vomiting, chest tightness, angina, or N/T.   PERTINENT HISTORY:  asthma, COPD, morbid obesity  PAIN:  Are you having pain? Yes: NPRS scale: 7/10 Pain location: Lt shoulder Pain description: throbbing Aggravating factors: lifting Lt arm overhead, laying on Lt arm, and lifting things Relieving factors: rest  PRECAUTIONS: None  WEIGHT BEARING RESTRICTIONS No  FALLS:  Has patient fallen in last 6 months? Yes. Number of falls 1 fall in 01/2021 leading to Lt shoulder pain  LIVING ENVIRONMENT: Lives with: lives alone Lives in: House/apartment Stairs: Yes: External: 4 steps; on right going up, on left going up, and can reach both Has following equipment at home: None  OCCUPATION: Unemployed, SSI  PLOF: Independent  PATIENT GOALS Return to ADLs, exercise  OBJECTIVE:   DIAGNOSTIC FINDINGS:  08/19/2021: MR Shoulder Left with or without Contrast: IMPRESSION: Mild-to-moderate supraspinatus and infraspinatus tendinopathy appears worse in the supraspinatus. There is a tiny interstitial tear of the far lateral supraspinatus but no full-thickness tendon tear or retraction.   Mild acromioclavicular osteoarthritis.   Small volume of subacromial/subdeltoid fluid compatible with bursitis.  PATIENT SURVEYS:  FOTO 57%, predicted 67% in 10 visits   COGNITION: Overall cognitive status: Within functional limits for tasks assessed   SENSATION: Not tested  POSTURE: rounded shoulders and forward head  PALPATION: TTP to supraspinatus musculotendinous junction    CERVICAL ROM:   Active ROM AROM (deg) eval  Flexion WNL  Extension WNL, dizzy  Right lateral flexion WNL  Left lateral flexion 50%, dizzy  Right rotation WNL  Left rotation 60%   (Blank rows = not tested)  UPPER EXTREMITY ROM:  Active ROM Right eval Left eval  Shoulder  flexion WNL 120p!  Shoulder abduction WNL 85p!  Shoulder internal rotation WNL 20p!  Shoulder external rotation WNL 75   (Blank rows = not tested)  UPPER EXTREMITY MMT:  MMT Right eval Left eval  Shoulder flexion 5/5 4/5p!  Shoulder abduction 5/5 4/5p!  Shoulder internal rotation 5/5 5/5  Shoulder external rotation 5/5 4+/5  Middle trapezius 3/5 3/5  Lower trapezius 3/5 3/5  Latissimus dorsi 3/5 3/5  Elbow flexion 5/5  5/5  Elbow extension 5/5 5/5  Grip strength     (Blank rows = not tested)  SPECIAL TESTS:  Dix-Hallpike: (+) on Rt Hawkins-Kennedy: (+) on Lt Neer's: (+) on Lt Painful arc: (+) on Lt ERLS at 90 degrees abduction: (-)    TODAY'S TREATMENT:   [facility] Adult PT Treatment:                                                DATE: 09/09/2021 Therapeutic Exercise: N/A Manual Therapy: N/A Neuromuscular re-ed: N/A Therapeutic Activity: Epley's maneuver: performed on Rt Demonstrated and issued HEP Modalities: N/A Self Care: N/A    PATIENT EDUCATION:  Education details: Pt educated on potential underlying pathophysiology behind her pain presentation, FOTO, POC, prognosis, and HEP Person educated: Patient Education method: Explanation, Demonstration, and Handouts Education comprehension: verbalized understanding and returned demonstration   HOME EXERCISE PROGRAM: Access Code: [email or web address] URL: [email or web address] Date: 09/09/2021 Prepared by: Vanessa [place]  Exercises - Shoulder External Rotation and Scapular Retraction with Resistance  - 1 x daily - 7 x weekly - 3 sets - 10 reps - 3-sec hold - Standing Shoulder Row with Anchored Resistance  - 1 x daily - 7 x weekly - 3 sets - 10 reps - 3-sec hold - Shoulder Scaption AAROM with Dowel  - 1 x daily - 7 x weekly - 2 sets - 10 reps - 5-sec hold - Seated Gaze Stabilization with Head Nod  - 1 x daily - 7 x weekly - 2 sets - 20 reps - Seated Gaze Stabilization with Head Rotation  - 1 x daily - 7 x  weekly - 2 sets - 20 reps  ASSESSMENT:  CLINICAL IMPRESSION: Patient is a 66 y.o. F who was seen today for physical therapy evaluation and treatment for Lt shoulder pain and dizziness.  Upon assessment, her primary impairments include weak global BIL parascapular musculature, weak and painful Lt shoulder flexion, abduction, and ER, limited Lt shoulder flexion, abduction, and IR AROM, TTP to Lt supraspinatus musculotendinous junction, and onset of dizziness with cervical extension and Lt lateral flexion. Ruling up Lt RTC pathology due to painful and limited Lt shoulder flexion, abduction, and ER strength, limited overhead AROM, TTP to supraspinatus musculotendinous junction, positive painful arc, positive Hawkins-Kennedy, and positive Neer's. Also ruling up Rt BPPV due to positive Dix-Hallpike to Rt, along with exacerbation of dizziness with cervical extension and Lt side bend. Pt will benefit from skilled PT to address her primary impairments and return to her prior level of function with less limitation.   OBJECTIVE IMPAIRMENTS Abnormal gait, decreased balance, decreased coordination, decreased mobility, difficulty walking, decreased ROM, decreased strength, dizziness, hypomobility, increased edema, impaired flexibility, impaired UE functional use, improper body mechanics, postural dysfunction, and pain.   ACTIVITY LIMITATIONS carrying, lifting, bending, sitting, standing, squatting, sleeping, bathing, dressing, and reach over head  PARTICIPATION LIMITATIONS: meal prep, cleaning, laundry, driving, shopping, community activity, and yard work  PERSONAL FACTORS 3+ comorbidities: asthma, COPD, morbid obesity  are also affecting patient's functional outcome.   REHAB POTENTIAL: Good  CLINICAL DECISION MAKING: Evolving/moderate complexity  EVALUATION COMPLEXITY: Moderate   GOALS: Goals reviewed with patient? Yes  SHORT TERM GOALS: Target date: 10/07/2021   Pt will report understanding and  adherence to her HEP in order to promote independence in the management of her primary impairments. Baseline: HEP provided at eval  Goal status: INITIAL   LONG TERM GOALS: Target date: 11/03/2021  Pt will achieve a FOTO score of 67% in order to demonstrate improved functional ability as it relates to her primary impairments. Baseline: 57% Goal status: INITIAL  2.  Pt will achieve Lt shoulder flexion AROM of 150 degrees or higher in order to reach into overhead cabinets with less limitation. Baseline: 120 degrees Goal status: INITIAL  3.  Pt will report ability to sleep through the night with no sleep disturbances due to pain. Baseline: Pt reports being woken every hour due to pain. Goal status: INITIAL  4.  Pt will demonstrate WNL global cervical AROM without exacerbation of dizziness in order to get dressed without dizziness. Baseline: See AROM chart, dizziness with cervical extension and Lt lateral flexion Goal status: INITIAL  5.  Pt will achieve global shoulder and parascapular strength of 4+/5 or greater in order to promote prophylaxis of future mechanical pain symptoms. Baseline: See MMT chart Goal status: INITIAL     PLAN: PT FREQUENCY: 2x/week  PT DURATION: 8 weeks  PLANNED INTERVENTIONS: Therapeutic exercises, Therapeutic activity, Neuromuscular re-education, Balance training, Gait training, Patient/Family education, Joint mobilization, Stair training, Vestibular training, Canalith repositioning, Aquatic Therapy, Dry Needling, Electrical stimulation, Cryotherapy, Moist heat, Taping, Vasopneumatic device, Traction, Biofeedback, Ionotophoresis '4mg'$ /ml Dexamethasone, Manual therapy, and Re-evaluation  PLAN FOR NEXT SESSION: Assess VOR, gaze stabilization, progress DNF/ parascapular/ RTC strengthening, gaze stabilization exercises/ Epley's   Cherie Ouch, PT 09/08/2021, 4:04 PM   Vanessa , PT, DPT 09/24/21 9:47 AM

## 2021-09-08 NOTE — Progress Notes (Signed)
Internal Medicine Clinic Attending  Case discussed with the resident at the time of the visit.  We reviewed the resident's history and exam and pertinent patient test results.  I agree with the assessment, diagnosis, and plan of care documented in the resident's note.  

## 2021-09-09 ENCOUNTER — Other Ambulatory Visit: Payer: Self-pay

## 2021-09-09 ENCOUNTER — Ambulatory Visit: Payer: Medicare Other | Attending: Internal Medicine

## 2021-09-09 DIAGNOSIS — R2681 Unsteadiness on feet: Secondary | ICD-10-CM | POA: Diagnosis not present

## 2021-09-09 DIAGNOSIS — S46002A Unspecified injury of muscle(s) and tendon(s) of the rotator cuff of left shoulder, initial encounter: Secondary | ICD-10-CM | POA: Diagnosis not present

## 2021-09-09 DIAGNOSIS — G8929 Other chronic pain: Secondary | ICD-10-CM | POA: Insufficient documentation

## 2021-09-09 DIAGNOSIS — R42 Dizziness and giddiness: Secondary | ICD-10-CM | POA: Insufficient documentation

## 2021-09-09 DIAGNOSIS — M6281 Muscle weakness (generalized): Secondary | ICD-10-CM | POA: Insufficient documentation

## 2021-09-09 DIAGNOSIS — M25512 Pain in left shoulder: Secondary | ICD-10-CM | POA: Diagnosis not present

## 2021-09-15 ENCOUNTER — Other Ambulatory Visit: Payer: Self-pay | Admitting: Family

## 2021-09-16 ENCOUNTER — Telehealth: Payer: Self-pay | Admitting: *Deleted

## 2021-09-16 ENCOUNTER — Other Ambulatory Visit: Payer: Self-pay

## 2021-09-16 ENCOUNTER — Ambulatory Visit: Payer: Medicare Other

## 2021-09-16 DIAGNOSIS — R42 Dizziness and giddiness: Secondary | ICD-10-CM

## 2021-09-16 DIAGNOSIS — G8929 Other chronic pain: Secondary | ICD-10-CM

## 2021-09-16 DIAGNOSIS — S46002A Unspecified injury of muscle(s) and tendon(s) of the rotator cuff of left shoulder, initial encounter: Secondary | ICD-10-CM | POA: Diagnosis not present

## 2021-09-16 DIAGNOSIS — R2681 Unsteadiness on feet: Secondary | ICD-10-CM

## 2021-09-16 DIAGNOSIS — M6281 Muscle weakness (generalized): Secondary | ICD-10-CM | POA: Diagnosis not present

## 2021-09-16 DIAGNOSIS — M25512 Pain in left shoulder: Secondary | ICD-10-CM | POA: Diagnosis not present

## 2021-09-16 MED ORDER — LEVOCETIRIZINE DIHYDROCHLORIDE 5 MG PO TABS: 5.0000 mg | ORAL_TABLET | Freq: Every evening | ORAL | 1 refills | Status: DC

## 2021-09-16 NOTE — Telephone Encounter (Signed)
Call from [facility] NP with [facility] - stated pt's A1C is 6.7 and she did a PAD test which showed Right leg normal @ 1.09 and Left leg borderline circulation problem @ 0.90. Stated she is required to report findings to the pt's doctor.

## 2021-09-16 NOTE — Telephone Encounter (Signed)
Acknowledged.

## 2021-09-16 NOTE — Therapy (Signed)
OUTPATIENT PHYSICAL THERAPY TREATMENT NOTE   Patient Name: Maria Nelson MRN: [id number] DOB:10-09-1955, 66 y.o., female Today's Date: 09/16/2021  PCP: Madalyn Rob, MD REFERRING PROVIDER: Sid Falcon, MD  END OF SESSION:   PT End of Session - 09/16/21 1042     Visit Number 2    Number of Visits 17    Date for PT Re-Evaluation 11/11/21    Authorization Type [employer] MCR    Authorization Time Period FOTO v6, v10, kx mod v15    Progress Note Due on Visit 10    PT Start Time 1044    PT Stop Time 1126    PT Time Calculation (min) 42 min    Activity Tolerance Patient tolerated treatment well    Behavior During Therapy [facility] for tasks assessed/performed;Agitated             Past Medical History:  Diagnosis Date   ANGIOEDEMA 12/16/2009   Asthma    Carpal tunnel syndrome 06/23/2011   Patient notes history of bilateral carpal tunnel syndrome.  Now has symptoms of right hand carpal tunnel.    COPD (chronic obstructive pulmonary disease) ([place])    secondary to tobacco use // No PFTs on file   COVID-19    ETOH abuse    Pt stopped in 3/08   Gastrointestinal hemorrhage    secondary to PUD on March 2008   OSA on CPAP    noncompliant with CPAP (2/2 it being depressing)   Peptic ulcer disease    +H. pylori (antigen)- Dr. Olevia Perches- treated   STRESS INCONTINENCE 01/24/2007   Tobacco abuse    quit in 2010   Urinary incontinence    Urticaria    Past Surgical History:  Procedure Laterality Date   BREAST EXCISIONAL BIOPSY Right 1994   MANDIBLE SURGERY     due to trauma   TUBAL LIGATION     Patient Active Problem List   Diagnosis Date Noted   Benign paroxysmal positional vertigo 09/03/2021   Colon cancer screening 07/14/2021   Healthcare maintenance 01/28/2021   Hyperlipidemia 11/22/2019   UTI (urinary tract infection) 11/22/2019   Adnexal pain 09/06/2018   Rash 05/11/2018   Paroxysmal atrial fibrillation ([place]) 08/24/2017   Dyspepsia 08/24/2017   Knee pain, left  08/16/2016   Back pain 06/23/2016   Urticaria 07/01/2015   Asthma with COPD 03/04/2015   Allergic rhinitis with nonallergic component 03/04/2015   COPD (chronic obstructive pulmonary disease) ([place]) 06/19/2014   Blood pressure elevated without history of HTN 03/05/2014   HNP (herniated nucleus pulposus), lumbar 04/16/2013   Tenosynovitis, de Quervain 11/15/2012   Morbid obesity with BMI of 45.0-49.9, adult ([place]) 09/08/2012   Carpal tunnel syndrome 06/23/2011   Shoulder pain 09/02/2010   Sleep apnea 09/02/2010   GERD 12/24/2009   Urticaria/angioedema 12/16/2009   STRESS INCONTINENCE 01/24/2007   History of peptic ulcer disease 07/26/2006    REFERRING DIAG: H81.10 (ICD-10-CM) - Benign paroxysmal positional vertigo, unspecified laterality  THERAPY DIAG:  Dizziness and giddiness  Muscle weakness (generalized)  Chronic left shoulder pain  Unsteadiness on feet  Rationale for Evaluation and Treatment Rehabilitation  PERTINENT HISTORY: asthma, COPD, morbid obesity  SUBJECTIVE: Pt reports her Lt shoulder has been feeling better since her PT, although she has continued to have dizziness on a daily basis.   PAIN:  Are you having pain? Yes: NPRS scale: 4/10 Pain location: Lt shoulder Pain description: throbbing Aggravating factors: lifting Lt arm overhead, laying on Lt arm, and lifting things Relieving  factors: rest   OBJECTIVE: (objective measures completed at initial evaluation unless otherwise dated)   DIAGNOSTIC FINDINGS:  08/19/2021: MR Shoulder Left with or without Contrast: IMPRESSION: Mild-to-moderate supraspinatus and infraspinatus tendinopathy appears worse in the supraspinatus. There is a tiny interstitial tear of the far lateral supraspinatus but no full-thickness tendon tear or retraction.   Mild acromioclavicular osteoarthritis.   Small volume of subacromial/subdeltoid fluid compatible with bursitis.   PATIENT SURVEYS:  FOTO 57%, predicted 67% in 10 visits      COGNITION: Overall cognitive status: Within functional limits for tasks assessed     SENSATION: Not tested   POSTURE: rounded shoulders and forward head   PALPATION: TTP to supraspinatus musculotendinous junction              CERVICAL ROM:    Active ROM AROM (deg) eval  Flexion WNL  Extension WNL, dizzy  Right lateral flexion WNL  Left lateral flexion 50%, dizzy  Right rotation WNL  Left rotation 60%   (Blank rows = not tested)   UPPER EXTREMITY ROM:   Active ROM Right eval Left eval  Shoulder flexion WNL 120p!  Shoulder abduction WNL 85p!  Shoulder internal rotation WNL 20p!  Shoulder external rotation WNL 75   (Blank rows = not tested)   UPPER EXTREMITY MMT:   MMT Right eval Left eval  Shoulder flexion 5/5 4/5p!  Shoulder abduction 5/5 4/5p!  Shoulder internal rotation 5/5 5/5  Shoulder external rotation 5/5 4+/5  Middle trapezius 3/5 3/5  Lower trapezius 3/5 3/5  Latissimus dorsi 3/5 3/5  Elbow flexion 5/5 5/5  Elbow extension 5/5 5/5  Grip strength       (Blank rows = not tested)   SPECIAL TESTS:  Dix-Hallpike: (+) on Rt Hawkins-Kennedy: (+) on Lt Neer's: (+) on Lt Painful arc: (+) on Lt ERLS at 90 degrees abduction: (-)  09/16/2021: head thrust maneuver for VOR: (-) for nystagmus or delayed correction Lateral smooth pursuit: WNL Vertical smooth pursuit: mild delay with downward focus BIL       TODAY'S TREATMENT:   [facility] Adult PT Treatment:                                                DATE: 09/16/2021 Therapeutic Exercise: Standing BIL scaption with two 3# dumbbells with back on wall 3x10 Standing shoulder rolls 2x10 forward and backward Bent-over rear delt flies with 3# cable 2x10 BIL Standing BIL shoulder extension with 7# cables 3x10 with chin tuck hold Standing arm circles with added BIL shoulder abduction/ adduction 2x10 Manual Therapy: N/A Neuromuscular re-ed: Lateral smooth pursuit with head turns 3x30sec Vertical smooth  pursuit with head nods 3x30sec Diagonal smooth pursuit with head turns 3x30sec Walking through gym with head turns and focus on random items called out by PT x5 laps Therapeutic Activity: N/A Modalities: N/A Self Care: N/A    [facility] Adult PT Treatment:                                                DATE: 09/09/2021 Therapeutic Exercise: N/A Manual Therapy: N/A Neuromuscular re-ed: N/A Therapeutic Activity: Epley's maneuver: performed on Rt Demonstrated and issued HEP Modalities: N/A Self Care: N/A  PATIENT EDUCATION:  Education details: Pt educated on potential underlying pathophysiology behind her pain presentation, FOTO, POC, prognosis, and HEP Person educated: Patient Education method: Explanation, Demonstration, and Handouts Education comprehension: verbalized understanding and returned demonstration     HOME EXERCISE PROGRAM: Access Code: [place]: [email or web address] Date: 09/09/2021 Prepared by: Vanessa [place]   Exercises - Shoulder External Rotation and Scapular Retraction with Resistance  - 1 x daily - 7 x weekly - 3 sets - 10 reps - 3-sec hold - Standing Shoulder Row with Anchored Resistance  - 1 x daily - 7 x weekly - 3 sets - 10 reps - 3-sec hold - Shoulder Scaption AAROM with Dowel  - 1 x daily - 7 x weekly - 2 sets - 10 reps - 5-sec hold - Seated Gaze Stabilization with Head Nod  - 1 x daily - 7 x weekly - 2 sets - 20 reps - Seated Gaze Stabilization with Head Rotation  - 1 x daily - 7 x weekly - 2 sets - 20 reps   ASSESSMENT:   CLINICAL IMPRESSION: Upon further assessment, pt demonstrates mild delay with vertical smooth pursuit with downward head nods. This was targeting with smooth pursuit habituation exercises, to which the pt responded well. She also responded well to initial shoulder/ parascapular strengthening. She will continue to benefit from skilled PT to address her primary impairments and return to her prior level of  function with less limitation.     OBJECTIVE IMPAIRMENTS Abnormal gait, decreased balance, decreased coordination, decreased mobility, difficulty walking, decreased ROM, decreased strength, dizziness, hypomobility, increased edema, impaired flexibility, impaired UE functional use, improper body mechanics, postural dysfunction, and pain.    ACTIVITY LIMITATIONS carrying, lifting, bending, sitting, standing, squatting, sleeping, bathing, dressing, and reach over head   PARTICIPATION LIMITATIONS: meal prep, cleaning, laundry, driving, shopping, community activity, and yard work   PERSONAL FACTORS 3+ comorbidities: asthma, COPD, morbid obesity  are also affecting patient's functional outcome.        GOALS: Goals reviewed with patient? Yes   SHORT TERM GOALS: Target date: 10/07/2021    Pt will report understanding and adherence to her HEP in order to promote independence in the management of her primary impairments. Baseline: HEP provided at eval Goal status: INITIAL     LONG TERM GOALS: Target date: 11/03/2021   Pt will achieve a FOTO score of 67% in order to demonstrate improved functional ability as it relates to her primary impairments. Baseline: 57% Goal status: INITIAL   2.  Pt will achieve Lt shoulder flexion AROM of 150 degrees or higher in order to reach into overhead cabinets with less limitation. Baseline: 120 degrees Goal status: INITIAL   3.  Pt will report ability to sleep through the night with no sleep disturbances due to pain. Baseline: Pt reports being woken every hour due to pain. Goal status: INITIAL   4.  Pt will demonstrate WNL global cervical AROM without exacerbation of dizziness in order to get dressed without dizziness. Baseline: See AROM chart, dizziness with cervical extension and Lt lateral flexion Goal status: INITIAL   5.  Pt will achieve global shoulder and parascapular strength of 4+/5 or greater in order to promote prophylaxis of future mechanical  pain symptoms. Baseline: See MMT chart Goal status: INITIAL         PLAN: PT FREQUENCY: 2x/week   PT DURATION: 8 weeks   PLANNED INTERVENTIONS: Therapeutic exercises, Therapeutic activity, Neuromuscular re-education, Balance training, Gait training, Patient/Family education, Joint mobilization, Stair training,  Vestibular training, Canalith repositioning, Aquatic Therapy, Dry Needling, Electrical stimulation, Cryotherapy, Moist heat, Taping, Vasopneumatic device, Traction, Biofeedback, Ionotophoresis '4mg'$ /ml Dexamethasone, Manual therapy, and Re-evaluation   PLAN FOR NEXT SESSION: Assess VOR, gaze stabilization, progress DNF/ parascapular/ RTC strengthening, gaze stabilization exercises/ Sheral Apley, PT, DPT 09/16/21 11:26 AM

## 2021-09-17 ENCOUNTER — Other Ambulatory Visit: Payer: Self-pay | Admitting: *Deleted

## 2021-09-18 ENCOUNTER — Ambulatory Visit: Payer: Medicare Other | Attending: Internal Medicine

## 2021-09-18 DIAGNOSIS — G8929 Other chronic pain: Secondary | ICD-10-CM | POA: Insufficient documentation

## 2021-09-18 DIAGNOSIS — M6281 Muscle weakness (generalized): Secondary | ICD-10-CM | POA: Insufficient documentation

## 2021-09-18 DIAGNOSIS — R42 Dizziness and giddiness: Secondary | ICD-10-CM | POA: Diagnosis not present

## 2021-09-18 DIAGNOSIS — R2681 Unsteadiness on feet: Secondary | ICD-10-CM | POA: Insufficient documentation

## 2021-09-18 DIAGNOSIS — M25512 Pain in left shoulder: Secondary | ICD-10-CM | POA: Diagnosis not present

## 2021-09-18 MED ORDER — PANTOPRAZOLE SODIUM 40 MG PO TBEC: 40.0000 mg | DELAYED_RELEASE_TABLET | Freq: Every day | ORAL | 1 refills | Status: DC

## 2021-09-18 NOTE — Therapy (Signed)
OUTPATIENT PHYSICAL THERAPY TREATMENT NOTE   Patient Name: Maria Nelson MRN: [id number] DOB:January 16, 1956, 66 y.o., female Today's Date: 09/18/2021  PCP: Madalyn Rob, MD REFERRING PROVIDER: Sid Falcon, MD  END OF SESSION:   PT End of Session - 09/18/21 0954     Visit Number 3    Number of Visits 17    Date for PT Re-Evaluation 11/11/21    Authorization Type UHC MCR    Authorization Time Period FOTO v6, v10, kx mod v15    Progress Note Due on Visit 10    PT Start Time 0958    PT Stop Time 1038    PT Time Calculation (min) 40 min    Activity Tolerance Patient tolerated treatment well    Behavior During Therapy [facility] for tasks assessed/performed              Past Medical History:  Diagnosis Date   ANGIOEDEMA 12/16/2009   Asthma    Carpal tunnel syndrome 06/23/2011   Patient notes history of bilateral carpal tunnel syndrome.  Now has symptoms of right hand carpal tunnel.    COPD (chronic obstructive pulmonary disease) ([place])    secondary to tobacco use // No PFTs on file   COVID-19    ETOH abuse    Pt stopped in 3/08   Gastrointestinal hemorrhage    secondary to PUD on March 2008   OSA on CPAP    noncompliant with CPAP (2/2 it being depressing)   Peptic ulcer disease    +H. pylori (antigen)- Dr. Olevia Perches- treated   STRESS INCONTINENCE 01/24/2007   Tobacco abuse    quit in 2010   Urinary incontinence    Urticaria    Past Surgical History:  Procedure Laterality Date   BREAST EXCISIONAL BIOPSY Right 1994   MANDIBLE SURGERY     due to trauma   TUBAL LIGATION     Patient Active Problem List   Diagnosis Date Noted   Benign paroxysmal positional vertigo 09/03/2021   Colon cancer screening 07/14/2021   Healthcare maintenance 01/28/2021   Hyperlipidemia 11/22/2019   UTI (urinary tract infection) 11/22/2019   Adnexal pain 09/06/2018   Rash 05/11/2018   Paroxysmal atrial fibrillation ([place]) 08/24/2017   Dyspepsia 08/24/2017   Knee pain, left  08/16/2016   Back pain 06/23/2016   Urticaria 07/01/2015   Asthma with COPD 03/04/2015   Allergic rhinitis with nonallergic component 03/04/2015   COPD (chronic obstructive pulmonary disease) ([place]) 06/19/2014   Blood pressure elevated without history of HTN 03/05/2014   HNP (herniated nucleus pulposus), lumbar 04/16/2013   Tenosynovitis, de Quervain 11/15/2012   Morbid obesity with BMI of 45.0-49.9, adult ([place]) 09/08/2012   Carpal tunnel syndrome 06/23/2011   Shoulder pain 09/02/2010   Sleep apnea 09/02/2010   GERD 12/24/2009   Urticaria/angioedema 12/16/2009   STRESS INCONTINENCE 01/24/2007   History of peptic ulcer disease 07/26/2006    REFERRING DIAG: H81.10 (ICD-10-CM) - Benign paroxysmal positional vertigo, unspecified laterality  THERAPY DIAG:  Dizziness and giddiness  Muscle weakness (generalized)  Chronic left shoulder pain  Unsteadiness on feet  Rationale for Evaluation and Treatment Rehabilitation  PERTINENT HISTORY: asthma, COPD, morbid obesity  SUBJECTIVE: Pt reports that "everything is aching today," reporting LBP and BIL knee pain, although she reports that she doesn't have Lt shoulder pain today. She reports continued dizziness daily.   PAIN:  Are you having pain? Yes: NPRS scale: 4/10 Pain location: Lt shoulder Pain description: throbbing Aggravating factors: lifting Lt arm overhead, laying  on Lt arm, and lifting things Relieving factors: rest   OBJECTIVE: (objective measures completed at initial evaluation unless otherwise dated)   DIAGNOSTIC FINDINGS:  08/19/2021: MR Shoulder Left with or without Contrast: IMPRESSION: Mild-to-moderate supraspinatus and infraspinatus tendinopathy appears worse in the supraspinatus. There is a tiny interstitial tear of the far lateral supraspinatus but no full-thickness tendon tear or retraction.   Mild acromioclavicular osteoarthritis.   Small volume of subacromial/subdeltoid fluid compatible with bursitis.    PATIENT SURVEYS:  FOTO 57%, predicted 67% in 10 visits     COGNITION: Overall cognitive status: Within functional limits for tasks assessed     SENSATION: Not tested   POSTURE: rounded shoulders and forward head   PALPATION: TTP to supraspinatus musculotendinous junction              CERVICAL ROM:    Active ROM AROM (deg) eval  Flexion WNL  Extension WNL, dizzy  Right lateral flexion WNL  Left lateral flexion 50%, dizzy  Right rotation WNL  Left rotation 60%   (Blank rows = not tested)   UPPER EXTREMITY ROM:   Active ROM Right eval Left eval  Shoulder flexion WNL 120p!  Shoulder abduction WNL 85p!  Shoulder internal rotation WNL 20p!  Shoulder external rotation WNL 75   (Blank rows = not tested)   UPPER EXTREMITY MMT:   MMT Right eval Left eval  Shoulder flexion 5/5 4/5p!  Shoulder abduction 5/5 4/5p!  Shoulder internal rotation 5/5 5/5  Shoulder external rotation 5/5 4+/5  Middle trapezius 3/5 3/5  Lower trapezius 3/5 3/5  Latissimus dorsi 3/5 3/5  Elbow flexion 5/5 5/5  Elbow extension 5/5 5/5  Grip strength       (Blank rows = not tested)   SPECIAL TESTS:  Dix-Hallpike: (+) on Rt Hawkins-Kennedy: (+) on Lt Neer's: (+) on Lt Painful arc: (+) on Lt ERLS at 90 degrees abduction: (-)  09/16/2021: head thrust maneuver for VOR: (-) for nystagmus or delayed correction Lateral smooth pursuit: WNL Vertical smooth pursuit: mild delay with downward focus BIL  09/18/2021: Rt torsional upbeating nystagmus noted with Rt Dix-Hallpike: Positive for Rt posterior canal hyperactivity, Lt hypofunction       TODAY'S TREATMENT:   [facility] Adult PT Treatment:                                                DATE: 09/18/2021 Therapeutic Exercise: Standing BIL shoulder extension with 7# cables 3x10 with chin tuck hold Standing shoulder rolls 2x10 forward and backward Seated low rows with 20# and chin tuck hold x10 Seated high rows with 20# and chin tuck hold  x10 Seated lat pull-downs with 20# and chin tuck hold x10 Manual Therapy: N/A Neuromuscular re-ed: Epley's maneuver: performed on Rt Lateral smooth pursuit with head turns 3x30sec Vertical smooth pursuit with head nods 3x30sec Diagonal smooth pursuit with head turns 3x30sec Therapeutic Activity: N/A Modalities: N/A Self Care: N/A   [facility] Adult PT Treatment:                                                DATE: 09/16/2021 Therapeutic Exercise: Standing BIL scaption with two 3# dumbbells with back on wall 3x10 Standing shoulder rolls 2x10 forward and  backward Bent-over rear delt flies with 3# cable 2x10 BIL Standing BIL shoulder extension with 7# cables 3x10 with chin tuck hold Standing arm circles with added BIL shoulder abduction/ adduction 2x10 Manual Therapy: N/A Neuromuscular re-ed: Lateral smooth pursuit with head turns 3x30sec Vertical smooth pursuit with head nods 3x30sec Diagonal smooth pursuit with head turns 3x30sec Walking through gym with head turns and focus on random items called out by PT x5 laps Therapeutic Activity: N/A Modalities: N/A Self Care: N/A    [facility] Adult PT Treatment:                                                DATE: 09/09/2021 Therapeutic Exercise: N/A Manual Therapy: N/A Neuromuscular re-ed: N/A Therapeutic Activity: Epley's maneuver: performed on Rt Demonstrated and issued HEP Modalities: N/A Self Care: N/A       PATIENT EDUCATION:  Education details: Pt educated on potential underlying pathophysiology behind her pain presentation, FOTO, POC, prognosis, and HEP Person educated: Patient Education method: Explanation, Demonstration, and Handouts Education comprehension: verbalized understanding and returned demonstration     HOME EXERCISE PROGRAM: Access Code: [place]: [email or web address] Date: 09/09/2021 Prepared by: Vanessa [place]   Exercises - Shoulder External Rotation and Scapular Retraction  with Resistance  - 1 x daily - 7 x weekly - 3 sets - 10 reps - 3-sec hold - Standing Shoulder Row with Anchored Resistance  - 1 x daily - 7 x weekly - 3 sets - 10 reps - 3-sec hold - Shoulder Scaption AAROM with Dowel  - 1 x daily - 7 x weekly - 2 sets - 10 reps - 5-sec hold - Seated Gaze Stabilization with Head Nod  - 1 x daily - 7 x weekly - 2 sets - 20 reps - Seated Gaze Stabilization with Head Rotation  - 1 x daily - 7 x weekly - 2 sets - 20 reps  Added 09/18/2021:  - Self-Epley Maneuver Right Ear  - 1 x daily - 7 x weekly - 1 sets - 1 reps   ASSESSMENT:   CLINICAL IMPRESSION: Pt responded well to all interventions today, demonstrating good form and no increase in pain with performed exercises. Upon re-assessment of Rt posterior canal Dix-Hallpike, the pt demonstrated Rt torsional upbeating nystagmus, which is indicative of Rt posterior canal hyperfunction/ Lt posterior canal hypofunction. This was treated to some effect with Epley's maneuver. The pt will continue to benefit from skilled PT to address her primary impairments and return to her PLOF.     OBJECTIVE IMPAIRMENTS Abnormal gait, decreased balance, decreased coordination, decreased mobility, difficulty walking, decreased ROM, decreased strength, dizziness, hypomobility, increased edema, impaired flexibility, impaired UE functional use, improper body mechanics, postural dysfunction, and pain.    ACTIVITY LIMITATIONS carrying, lifting, bending, sitting, standing, squatting, sleeping, bathing, dressing, and reach over head   PARTICIPATION LIMITATIONS: meal prep, cleaning, laundry, driving, shopping, community activity, and yard work   PERSONAL FACTORS 3+ comorbidities: asthma, COPD, morbid obesity  are also affecting patient's functional outcome.        GOALS: Goals reviewed with patient? Yes   SHORT TERM GOALS: Target date: 10/07/2021    Pt will report understanding and adherence to her HEP in order to promote independence in  the management of her primary impairments. Baseline: HEP provided at eval Goal status: INITIAL     LONG TERM GOALS: Target  date: 11/03/2021   Pt will achieve a FOTO score of 67% in order to demonstrate improved functional ability as it relates to her primary impairments. Baseline: 57% Goal status: INITIAL   2.  Pt will achieve Lt shoulder flexion AROM of 150 degrees or higher in order to reach into overhead cabinets with less limitation. Baseline: 120 degrees Goal status: INITIAL   3.  Pt will report ability to sleep through the night with no sleep disturbances due to pain. Baseline: Pt reports being woken every hour due to pain. Goal status: INITIAL   4.  Pt will demonstrate WNL global cervical AROM without exacerbation of dizziness in order to get dressed without dizziness. Baseline: See AROM chart, dizziness with cervical extension and Lt lateral flexion Goal status: INITIAL   5.  Pt will achieve global shoulder and parascapular strength of 4+/5 or greater in order to promote prophylaxis of future mechanical pain symptoms. Baseline: See MMT chart Goal status: INITIAL         PLAN: PT FREQUENCY: 2x/week   PT DURATION: 8 weeks   PLANNED INTERVENTIONS: Therapeutic exercises, Therapeutic activity, Neuromuscular re-education, Balance training, Gait training, Patient/Family education, Joint mobilization, Stair training, Vestibular training, Canalith repositioning, Aquatic Therapy, Dry Needling, Electrical stimulation, Cryotherapy, Moist heat, Taping, Vasopneumatic device, Traction, Biofeedback, Ionotophoresis '4mg'$ /ml Dexamethasone, Manual therapy, and Re-evaluation   PLAN FOR NEXT SESSION: Assess VOR, gaze stabilization, progress DNF/ parascapular/ RTC strengthening, gaze stabilization exercises/ Sheral Apley, PT, DPT 09/18/21 10:42 AM

## 2021-09-21 NOTE — Telephone Encounter (Signed)
I called the patient and she currently uses xyzal not zyrtec. She was able to get the refill that was sent in on 09/16/21. This one is a duplicate.

## 2021-09-21 NOTE — Telephone Encounter (Signed)
This looks like a duplicate. Also, it looks like  at the last office visit Dr. Nelva Bush had her on Zyrtec

## 2021-09-22 ENCOUNTER — Other Ambulatory Visit: Payer: Self-pay

## 2021-09-30 ENCOUNTER — Ambulatory Visit: Payer: Medicare Other

## 2021-10-02 ENCOUNTER — Ambulatory Visit: Payer: Medicare Other

## 2021-10-02 DIAGNOSIS — G8929 Other chronic pain: Secondary | ICD-10-CM

## 2021-10-02 DIAGNOSIS — M25512 Pain in left shoulder: Secondary | ICD-10-CM | POA: Diagnosis not present

## 2021-10-02 DIAGNOSIS — M6281 Muscle weakness (generalized): Secondary | ICD-10-CM | POA: Diagnosis not present

## 2021-10-02 DIAGNOSIS — R42 Dizziness and giddiness: Secondary | ICD-10-CM

## 2021-10-02 DIAGNOSIS — R2681 Unsteadiness on feet: Secondary | ICD-10-CM | POA: Diagnosis not present

## 2021-10-02 NOTE — Therapy (Signed)
OUTPATIENT PHYSICAL THERAPY TREATMENT NOTE   Patient Name: Maria Nelson MRN: [id number] DOB:[birth year], 66 y.o., female Today's Date: 10/02/2021  PCP: Madalyn Rob, MD REFERRING PROVIDER: Sid Falcon, MD  END OF SESSION:   PT End of Session - 10/02/21 1212     Visit Number 4    Number of Visits 17    Date for PT Re-Evaluation 11/11/21    Authorization Type UHC MCR    Authorization Time Period FOTO v6, v10, kx mod v15    Progress Note Due on Visit 10    PT Start Time 1216    PT Stop Time 1258    PT Time Calculation (min) 42 min    Activity Tolerance Patient tolerated treatment well    Behavior During Therapy [facility] for tasks assessed/performed               Past Medical History:  Diagnosis Date   ANGIOEDEMA 12/16/2009   Asthma    Carpal tunnel syndrome 06/23/2011   Patient notes history of bilateral carpal tunnel syndrome.  Now has symptoms of right hand carpal tunnel.    COPD (chronic obstructive pulmonary disease) ([place])    secondary to tobacco use // No PFTs on file   COVID-19    ETOH abuse    Pt stopped in 3/08   Gastrointestinal hemorrhage    secondary to PUD on March 2008   OSA on CPAP    noncompliant with CPAP (2/2 it being depressing)   Peptic ulcer disease    +H. pylori (antigen)- Dr. Olevia Perches- treated   STRESS INCONTINENCE 01/24/2007   Tobacco abuse    quit in 2010   Urinary incontinence    Urticaria    Past Surgical History:  Procedure Laterality Date   BREAST EXCISIONAL BIOPSY Right 1994   MANDIBLE SURGERY     due to trauma   TUBAL LIGATION     Patient Active Problem List   Diagnosis Date Noted   Benign paroxysmal positional vertigo 09/03/2021   Colon cancer screening 07/14/2021   Healthcare maintenance 01/28/2021   Hyperlipidemia 11/22/2019   UTI (urinary tract infection) 11/22/2019   Adnexal pain 09/06/2018   Rash 05/11/2018   Paroxysmal atrial fibrillation ([place]) 08/24/2017   Dyspepsia 08/24/2017   Knee pain, left  08/16/2016   Back pain 06/23/2016   Urticaria 07/01/2015   Asthma with COPD 03/04/2015   Allergic rhinitis with nonallergic component 03/04/2015   COPD (chronic obstructive pulmonary disease) ([place]) 06/19/2014   Blood pressure elevated without history of HTN 03/05/2014   HNP (herniated nucleus pulposus), lumbar 04/16/2013   Tenosynovitis, de Quervain 11/15/2012   Morbid obesity with BMI of 45.0-49.9, adult ([place]) 09/08/2012   Carpal tunnel syndrome 06/23/2011   Shoulder pain 09/02/2010   Sleep apnea 09/02/2010   GERD 12/24/2009   Urticaria/angioedema 12/16/2009   STRESS INCONTINENCE 01/24/2007   History of peptic ulcer disease 07/26/2006    REFERRING DIAG: H81.10 (ICD-10-CM) - Benign paroxysmal positional vertigo, unspecified laterality  THERAPY DIAG:  Dizziness and giddiness  Muscle weakness (generalized)  Chronic left shoulder pain  Unsteadiness on feet  Rationale for Evaluation and Treatment Rehabilitation  PERTINENT HISTORY: asthma, COPD, morbid obesity  SUBJECTIVE: Pt reports that her shoulder has been feeling better, although it still hurts at night time. She also reports that her dizziness has been improving during the day, but she still gets dizzy when turning in bed at night.   PAIN:  Are you having pain? Yes: NPRS scale: 3/10 Pain location: Lt  shoulder Pain description: throbbing Aggravating factors: lifting Lt arm overhead, laying on Lt arm, and lifting things Relieving factors: rest   OBJECTIVE: (objective measures completed at initial evaluation unless otherwise dated)   DIAGNOSTIC FINDINGS:  08/19/2021: MR Shoulder Left with or without Contrast: IMPRESSION: Mild-to-moderate supraspinatus and infraspinatus tendinopathy appears worse in the supraspinatus. There is a tiny interstitial tear of the far lateral supraspinatus but no full-thickness tendon tear or retraction.   Mild acromioclavicular osteoarthritis.   Small volume of subacromial/subdeltoid  fluid compatible with bursitis.   PATIENT SURVEYS:  FOTO 57%, predicted 67% in 10 visits     COGNITION: Overall cognitive status: Within functional limits for tasks assessed     SENSATION: Not tested   POSTURE: rounded shoulders and forward head   PALPATION: TTP to supraspinatus musculotendinous junction              CERVICAL ROM:    Active ROM AROM (deg) eval AROM (Deg) 10/02/2021  Flexion WNL WNL  Extension WNL, dizzy WNL  Right lateral flexion WNL WNL  Left lateral flexion 50%, dizzy WNL, minor dizziness  Right rotation WNL WNL  Left rotation 60% WNL   (Blank rows = not tested)   UPPER EXTREMITY ROM:   Active ROM Right eval Left eval Left 10/02/2021  Shoulder flexion WNL 120p! 80p!  Shoulder abduction WNL 85p! 170p!  Shoulder internal rotation WNL 20p! 78  Shoulder external rotation WNL 75 90   (Blank rows = not tested)   UPPER EXTREMITY MMT:   MMT Right eval Left eval Left 10/02/2021  Shoulder flexion 5/5 4/5p! 5/5  Shoulder abduction 5/5 4/5p! 5/5  Shoulder internal rotation 5/5 5/5   Shoulder external rotation 5/5 4+/5 5/5  Middle trapezius 3/5 3/5 4+/5  Lower trapezius 3/5 3/5 4+/5  Latissimus dorsi 3/5 3/5 4+/5  Elbow flexion 5/5 5/5   Elbow extension 5/5 5/5   Grip strength        (Blank rows = not tested)   SPECIAL TESTS:  Dix-Hallpike: (+) on Rt Hawkins-Kennedy: (+) on Lt Neer's: (+) on Lt Painful arc: (+) on Lt ERLS at 90 degrees abduction: (-)  09/16/2021: head thrust maneuver for VOR: (-) for nystagmus or delayed correction Lateral smooth pursuit: WNL Vertical smooth pursuit: mild delay with downward focus BIL  09/18/2021: Rt torsional upbeating nystagmus noted with Rt Dix-Hallpike: Positive for Rt posterior canal hyperactivity, Lt hypofunction       TODAY'S TREATMENT:   [facility] Adult PT Treatment:                                                DATE: 10/02/2021 Therapeutic Exercise: Standing BIL shoulder ER with scapular  retraction with 3# cables 3x8 Standing shoulder rolls 2x10 forward and backward Standing BIL shoulder scaption with 3# dumbbells 3x10 Seated lat pull-downs with 35# cable 3x10 Manual Therapy: N/A Neuromuscular re-ed: Dix-hallpike (+) on Lt, (-) on Rt Lt Epley's maneuver x1 Therapeutic Activity: Re-assessment of objective measures with pt education Modalities: N/A Self Care: N/A   [facility] Adult PT Treatment:                                                DATE: 09/18/2021 Therapeutic Exercise: Standing BIL shoulder  extension with 7# cables 3x10 with chin tuck hold Standing shoulder rolls 2x10 forward and backward Seated low rows with 20# and chin tuck hold x10 Seated high rows with 20# and chin tuck hold x10 Seated lat pull-downs with 20# and chin tuck hold x10 Manual Therapy: N/A Neuromuscular re-ed: Epley's maneuver: performed on Rt Lateral smooth pursuit with head turns 3x30sec Vertical smooth pursuit with head nods 3x30sec Diagonal smooth pursuit with head turns 3x30sec Therapeutic Activity: N/A Modalities: N/A Self Care: N/A   [facility] Adult PT Treatment:                                                DATE: 09/16/2021 Therapeutic Exercise: Standing BIL scaption with two 3# dumbbells with back on wall 3x10 Standing shoulder rolls 2x10 forward and backward Bent-over rear delt flies with 3# cable 2x10 BIL Standing BIL shoulder extension with 7# cables 3x10 with chin tuck hold Standing arm circles with added BIL shoulder abduction/ adduction 2x10 Manual Therapy: N/A Neuromuscular re-ed: Lateral smooth pursuit with head turns 3x30sec Vertical smooth pursuit with head nods 3x30sec Diagonal smooth pursuit with head turns 3x30sec Walking through gym with head turns and focus on random items called out by PT x5 laps Therapeutic Activity: N/A Modalities: N/A Self Care: N/A        PATIENT EDUCATION:  Education details: Pt educated on potential underlying pathophysiology  behind her pain presentation, FOTO, POC, prognosis, and HEP Person educated: Patient Education method: Explanation, Demonstration, and Handouts Education comprehension: verbalized understanding and returned demonstration     HOME EXERCISE PROGRAM: Access Code: [email or web address] URL: [email or web address] Date: 09/09/2021 Prepared by: Vanessa [place]   Exercises - Shoulder External Rotation and Scapular Retraction with Resistance  - 1 x daily - 7 x weekly - 3 sets - 10 reps - 3-sec hold - Standing Shoulder Row with Anchored Resistance  - 1 x daily - 7 x weekly - 3 sets - 10 reps - 3-sec hold - Shoulder Scaption AAROM with Dowel  - 1 x daily - 7 x weekly - 2 sets - 10 reps - 5-sec hold - Seated Gaze Stabilization with Head Nod  - 1 x daily - 7 x weekly - 2 sets - 20 reps - Seated Gaze Stabilization with Head Rotation  - 1 x daily - 7 x weekly - 2 sets - 20 reps  Added 09/18/2021:  - Self-Epley Maneuver Right Ear  - 1 x daily - 7 x weekly - 1 sets - 1 reps   ASSESSMENT:   CLINICAL IMPRESSION: Pt responded excellently to all interventions today, demonstrating good form and no increase in pain with selected exercises. Of note, the pt's Dix-Hallpike was negative on the right today, a significant improvement from the nystagmus noted at the last two visits. However, minor Lt down-beating nystagmus lasting about 10 seconds was noted with Lt Dix-Hallpike; these symptoms were improved with Epley maneuver. Upon re-assessment of objective measures, pt has made excellent improvements in Lt shoulder AROM and strength, along with cervical AROM, dizziness, and sleep disturbances due to pain. She will continue to benefit from skilled PT to address her primary impairments and return to her prior level of function with less limitation.     OBJECTIVE IMPAIRMENTS Abnormal gait, decreased balance, decreased coordination, decreased mobility, difficulty walking, decreased ROM, decreased strength, dizziness,  hypomobility, increased edema, impaired flexibility, impaired  UE functional use, improper body mechanics, postural dysfunction, and pain.    ACTIVITY LIMITATIONS carrying, lifting, bending, sitting, standing, squatting, sleeping, bathing, dressing, and reach over head   PARTICIPATION LIMITATIONS: meal prep, cleaning, laundry, driving, shopping, community activity, and yard work   PERSONAL FACTORS 3+ comorbidities: asthma, COPD, morbid obesity  are also affecting patient's functional outcome.        GOALS: Goals reviewed with patient? Yes   SHORT TERM GOALS: Target date: 10/07/2021    Pt will report understanding and adherence to her HEP in order to promote independence in the management of her primary impairments. Baseline: HEP provided at eval 10/02/2021: Pt reports daily adherence to her HEP Goal status: ACHIEVED     LONG TERM GOALS: Target date: 11/03/2021   Pt will achieve a FOTO score of 67% in order to demonstrate improved functional ability as it relates to her primary impairments. Baseline: 57% Goal status: INITIAL   2.  Pt will achieve Lt shoulder flexion AROM of 150 degrees or higher in order to reach into overhead cabinets with less limitation. Baseline: 120 degrees 10/02/2021: 180 degrees Goal status: ACHIEVED   3.  Pt will report ability to sleep through the night with no sleep disturbances due to pain. Baseline: Pt reports being woken every hour due to pain. 10/02/2021: Pt reports being woken once per night due to pain Goal status: ACHIEVED   4.  Pt will demonstrate WNL global cervical AROM without exacerbation of dizziness in order to get dressed without dizziness. Baseline: See AROM chart, dizziness with cervical extension and Lt lateral flexion 10/02/2021: See updated AROM chart Goal status: IN PROGRESS   5.  Pt will achieve global shoulder and parascapular strength of 4+/5 or greater in order to promote prophylaxis of future mechanical pain symptoms. Baseline:  See MMT chart 10/02/2021: See updated MMT chart Goal status: ACHIEVED         PLAN: PT FREQUENCY: 2x/week   PT DURATION: 8 weeks   PLANNED INTERVENTIONS: Therapeutic exercises, Therapeutic activity, Neuromuscular re-education, Balance training, Gait training, Patient/Family education, Joint mobilization, Stair training, Vestibular training, Canalith repositioning, Aquatic Therapy, Dry Needling, Electrical stimulation, Cryotherapy, Moist heat, Taping, Vasopneumatic device, Traction, Biofeedback, Ionotophoresis '4mg'$ /ml Dexamethasone, Manual therapy, and Re-evaluation   PLAN FOR NEXT SESSION: progress DNF/ parascapular/ RTC strengthening, gaze stabilization exercises/ Sheral Apley, PT, DPT 10/02/21 12:57 PM

## 2021-10-06 ENCOUNTER — Other Ambulatory Visit: Payer: Self-pay | Admitting: Allergy

## 2021-10-15 ENCOUNTER — Ambulatory Visit: Payer: Medicare Other

## 2021-10-15 DIAGNOSIS — G8929 Other chronic pain: Secondary | ICD-10-CM

## 2021-10-15 DIAGNOSIS — R42 Dizziness and giddiness: Secondary | ICD-10-CM | POA: Diagnosis not present

## 2021-10-15 DIAGNOSIS — M25512 Pain in left shoulder: Secondary | ICD-10-CM | POA: Diagnosis not present

## 2021-10-15 DIAGNOSIS — M6281 Muscle weakness (generalized): Secondary | ICD-10-CM

## 2021-10-15 DIAGNOSIS — R2681 Unsteadiness on feet: Secondary | ICD-10-CM | POA: Diagnosis not present

## 2021-10-15 NOTE — Therapy (Signed)
OUTPATIENT PHYSICAL THERAPY TREATMENT NOTE   Patient Name: Maria Nelson MRN: [id number] DOB:[birth year], 66 y.o., female Today's Date: 10/15/2021  PCP: Madalyn Rob, MD REFERRING PROVIDER: Sid Falcon, MD  END OF SESSION:   PT End of Session - 10/15/21 1831     Visit Number 5    Number of Visits 17    Date for PT Re-Evaluation 11/11/21    Authorization Type UHC MCR    Authorization Time Period FOTO v6, v10, kx mod v15    Progress Note Due on Visit 10    PT Start Time 1832    PT Stop Time 1910    PT Time Calculation (min) 38 min    Activity Tolerance Patient tolerated treatment well    Behavior During Therapy [facility] for tasks assessed/performed                Past Medical History:  Diagnosis Date   ANGIOEDEMA 12/16/2009   Asthma    Carpal tunnel syndrome 06/23/2011   Patient notes history of bilateral carpal tunnel syndrome.  Now has symptoms of right hand carpal tunnel.    COPD (chronic obstructive pulmonary disease) ([place])    secondary to tobacco use // No PFTs on file   COVID-19    ETOH abuse    Pt stopped in 3/08   Gastrointestinal hemorrhage    secondary to PUD on March 2008   OSA on CPAP    noncompliant with CPAP (2/2 it being depressing)   Peptic ulcer disease    +H. pylori (antigen)- Dr. Olevia Perches- treated   STRESS INCONTINENCE 01/24/2007   Tobacco abuse    quit in 2010   Urinary incontinence    Urticaria    Past Surgical History:  Procedure Laterality Date   BREAST EXCISIONAL BIOPSY Right 1994   MANDIBLE SURGERY     due to trauma   TUBAL LIGATION     Patient Active Problem List   Diagnosis Date Noted   Benign paroxysmal positional vertigo 09/03/2021   Colon cancer screening 07/14/2021   Healthcare maintenance 01/28/2021   Hyperlipidemia 11/22/2019   UTI (urinary tract infection) 11/22/2019   Adnexal pain 09/06/2018   Rash 05/11/2018   Paroxysmal atrial fibrillation ([place]) 08/24/2017   Dyspepsia 08/24/2017   Knee pain, left  08/16/2016   Back pain 06/23/2016   Urticaria 07/01/2015   Asthma with COPD 03/04/2015   Allergic rhinitis with nonallergic component 03/04/2015   COPD (chronic obstructive pulmonary disease) ([place]) 06/19/2014   Blood pressure elevated without history of HTN 03/05/2014   HNP (herniated nucleus pulposus), lumbar 04/16/2013   Tenosynovitis, de Quervain 11/15/2012   Morbid obesity with BMI of 45.0-49.9, adult ([place]) 09/08/2012   Carpal tunnel syndrome 06/23/2011   Shoulder pain 09/02/2010   Sleep apnea 09/02/2010   GERD 12/24/2009   Urticaria/angioedema 12/16/2009   STRESS INCONTINENCE 01/24/2007   History of peptic ulcer disease 07/26/2006    REFERRING DIAG: H81.10 (ICD-10-CM) - Benign paroxysmal positional vertigo, unspecified laterality  THERAPY DIAG:  Dizziness and giddiness  Muscle weakness (generalized)  Chronic left shoulder pain  Unsteadiness on feet  Rationale for Evaluation and Treatment Rehabilitation  PERTINENT HISTORY: asthma, COPD, morbid obesity  SUBJECTIVE: Pt reports no pain currently, although she continues to have intermittent night pain in her Lt shoulder. She reports improved dizziness, although she reports "slight" dizziness occasionally.  PAIN:  Are you having pain? Yes: NPRS scale: 0/10 Pain location: Lt shoulder Pain description: throbbing Aggravating factors: lifting Lt arm overhead, laying on  Lt arm, and lifting things Relieving factors: rest   OBJECTIVE: (objective measures completed at initial evaluation unless otherwise dated)   DIAGNOSTIC FINDINGS:  08/19/2021: MR Shoulder Left with or without Contrast: IMPRESSION: Mild-to-moderate supraspinatus and infraspinatus tendinopathy appears worse in the supraspinatus. There is a tiny interstitial tear of the far lateral supraspinatus but no full-thickness tendon tear or retraction.   Mild acromioclavicular osteoarthritis.   Small volume of subacromial/subdeltoid fluid compatible  with bursitis.   PATIENT SURVEYS:  FOTO 57%, predicted 67% in 10 visits     COGNITION: Overall cognitive status: Within functional limits for tasks assessed     SENSATION: Not tested   POSTURE: rounded shoulders and forward head   PALPATION: TTP to supraspinatus musculotendinous junction              CERVICAL ROM:    Active ROM AROM (deg) eval AROM (Deg) 10/02/2021  Flexion WNL WNL  Extension WNL, dizzy WNL  Right lateral flexion WNL WNL  Left lateral flexion 50%, dizzy WNL, minor dizziness  Right rotation WNL WNL  Left rotation 60% WNL   (Blank rows = not tested)   UPPER EXTREMITY ROM:   Active ROM Right eval Left eval Left 10/02/2021  Shoulder flexion WNL 120p! 80p!  Shoulder abduction WNL 85p! 170p!  Shoulder internal rotation WNL 20p! 78  Shoulder external rotation WNL 75 90   (Blank rows = not tested)   UPPER EXTREMITY MMT:   MMT Right eval Left eval Left 10/02/2021  Shoulder flexion 5/5 4/5p! 5/5  Shoulder abduction 5/5 4/5p! 5/5  Shoulder internal rotation 5/5 5/5   Shoulder external rotation 5/5 4+/5 5/5  Middle trapezius 3/5 3/5 4+/5  Lower trapezius 3/5 3/5 4+/5  Latissimus dorsi 3/5 3/5 4+/5  Elbow flexion 5/5 5/5   Elbow extension 5/5 5/5   Grip strength        (Blank rows = not tested)   SPECIAL TESTS:  Dix-Hallpike: (+) on Rt Hawkins-Kennedy: (+) on Lt Neer's: (+) on Lt Painful arc: (+) on Lt ERLS at 90 degrees abduction: (-)  09/16/2021: head thrust maneuver for VOR: (-) for nystagmus or delayed correction Lateral smooth pursuit: WNL Vertical smooth pursuit: mild delay with downward focus BIL  09/18/2021: Rt torsional upbeating nystagmus noted with Rt Dix-Hallpike: Positive for Rt posterior canal hyperactivity, Lt hypofunction       TODAY'S TREATMENT:   [facility] Adult PT Treatment:                                                DATE: 10/15/2021 Therapeutic Exercise: Seated BIL shoulder scaption with 4# dumbbells 2x10 Sidelying Lt  shoulder ER with 4# dumbbell 3x10 Seated low rows with 25# and chin tuck hold x10 Seated high rows with 25# and chin tuck hold x10 Seated lat pull-downs with 25# and chin tuck hold x10 Seated shoulder rolls 2x10 forward and backward Standing BIL shoulder extension with 7# cables 3x10 Standing arm circle angels 3x5 Manual Therapy: N/A Neuromuscular re-ed: N/A Therapeutic Activity: N/A Modalities: N/A Self Care: N/A  [facility] Adult PT Treatment:                                                DATE: 10/02/2021 Therapeutic  Exercise: Standing BIL shoulder ER with scapular retraction with 3# cables 3x8 Standing shoulder rolls 2x10 forward and backward Standing BIL shoulder scaption with 3# dumbbells 3x10 Seated lat pull-downs with 35# cable 3x10 Manual Therapy: N/A Neuromuscular re-ed: Dix-hallpike (+) on Lt, (-) on Rt Lt Epley's maneuver x1 Therapeutic Activity: Re-assessment of objective measures with pt education Modalities: N/A Self Care: N/A   [facility] Adult PT Treatment:                                                DATE: 09/18/2021 Therapeutic Exercise: Standing BIL shoulder extension with 7# cables 3x10 with chin tuck hold Standing shoulder rolls 2x10 forward and backward Seated low rows with 20# and chin tuck hold x10 Seated high rows with 20# and chin tuck hold x10 Seated lat pull-downs with 20# and chin tuck hold x10 Manual Therapy: N/A Neuromuscular re-ed: Epley's maneuver: performed on Rt Lateral smooth pursuit with head turns 3x30sec Vertical smooth pursuit with head nods 3x30sec Diagonal smooth pursuit with head turns 3x30sec Therapeutic Activity: N/A Modalities: N/A Self Care: N/A       PATIENT EDUCATION:  Education details: Pt educated on potential underlying pathophysiology behind her pain presentation, FOTO, POC, prognosis, and HEP Person educated: Patient Education method: Explanation, Demonstration, and Handouts Education comprehension: verbalized  understanding and returned demonstration     HOME EXERCISE PROGRAM: Access Code: [email or web address] URL: [email or web address] Date: 09/09/2021 Prepared by: Vanessa [place]   Exercises - Shoulder External Rotation and Scapular Retraction with Resistance  - 1 x daily - 7 x weekly - 3 sets - 10 reps - 3-sec hold - Standing Shoulder Row with Anchored Resistance  - 1 x daily - 7 x weekly - 3 sets - 10 reps - 3-sec hold - Shoulder Scaption AAROM with Dowel  - 1 x daily - 7 x weekly - 2 sets - 10 reps - 5-sec hold - Seated Gaze Stabilization with Head Nod  - 1 x daily - 7 x weekly - 2 sets - 20 reps - Seated Gaze Stabilization with Head Rotation  - 1 x daily - 7 x weekly - 2 sets - 20 reps  Added 09/18/2021:  - Self-Epley Maneuver Right Ear  - 1 x daily - 7 x weekly - 1 sets - 1 reps   ASSESSMENT:   CLINICAL IMPRESSION: Pt responded well to all progressed shoulder strengthening exercises today with good form and no increase in pain. Due to pt reporting no dizziness recently, treatment focused primarily on shoulder strengthening.      OBJECTIVE IMPAIRMENTS Abnormal gait, decreased balance, decreased coordination, decreased mobility, difficulty walking, decreased ROM, decreased strength, dizziness, hypomobility, increased edema, impaired flexibility, impaired UE functional use, improper body mechanics, postural dysfunction, and pain.    ACTIVITY LIMITATIONS carrying, lifting, bending, sitting, standing, squatting, sleeping, bathing, dressing, and reach over head   PARTICIPATION LIMITATIONS: meal prep, cleaning, laundry, driving, shopping, community activity, and yard work   PERSONAL FACTORS 3+ comorbidities: asthma, COPD, morbid obesity  are also affecting patient's functional outcome.        GOALS: Goals reviewed with patient? Yes   SHORT TERM GOALS: Target date: 10/07/2021    Pt will report understanding and adherence to her HEP in order to promote independence in the management  of her primary impairments. Baseline: HEP provided at eval 10/02/2021: Pt reports daily adherence to  her HEP Goal status: ACHIEVED     LONG TERM GOALS: Target date: 11/03/2021   Pt will achieve a FOTO score of 67% in order to demonstrate improved functional ability as it relates to her primary impairments. Baseline: 57% Goal status: INITIAL   2.  Pt will achieve Lt shoulder flexion AROM of 150 degrees or higher in order to reach into overhead cabinets with less limitation. Baseline: 120 degrees 10/02/2021: 180 degrees Goal status: ACHIEVED   3.  Pt will report ability to sleep through the night with no sleep disturbances due to pain. Baseline: Pt reports being woken every hour due to pain. 10/02/2021: Pt reports being woken once per night due to pain Goal status: ACHIEVED   4.  Pt will demonstrate WNL global cervical AROM without exacerbation of dizziness in order to get dressed without dizziness. Baseline: See AROM chart, dizziness with cervical extension and Lt lateral flexion 10/02/2021: See updated AROM chart Goal status: IN PROGRESS   5.  Pt will achieve global shoulder and parascapular strength of 4+/5 or greater in order to promote prophylaxis of future mechanical pain symptoms. Baseline: See MMT chart 10/02/2021: See updated MMT chart Goal status: ACHIEVED         PLAN: PT FREQUENCY: 2x/week   PT DURATION: 8 weeks   PLANNED INTERVENTIONS: Therapeutic exercises, Therapeutic activity, Neuromuscular re-education, Balance training, Gait training, Patient/Family education, Joint mobilization, Stair training, Vestibular training, Canalith repositioning, Aquatic Therapy, Dry Needling, Electrical stimulation, Cryotherapy, Moist heat, Taping, Vasopneumatic device, Traction, Biofeedback, Ionotophoresis '4mg'$ /ml Dexamethasone, Manual therapy, and Re-evaluation   PLAN FOR NEXT SESSION: progress DNF/ parascapular/ RTC strengthening, gaze stabilization exercises/  Sheral Apley, PT, DPT 10/15/21 7:10 PM

## 2021-10-21 ENCOUNTER — Ambulatory Visit: Payer: Medicare Other | Attending: Internal Medicine

## 2021-10-21 DIAGNOSIS — M6281 Muscle weakness (generalized): Secondary | ICD-10-CM | POA: Insufficient documentation

## 2021-10-21 DIAGNOSIS — R42 Dizziness and giddiness: Secondary | ICD-10-CM | POA: Insufficient documentation

## 2021-10-21 DIAGNOSIS — M25512 Pain in left shoulder: Secondary | ICD-10-CM | POA: Diagnosis not present

## 2021-10-21 DIAGNOSIS — G8929 Other chronic pain: Secondary | ICD-10-CM | POA: Insufficient documentation

## 2021-10-21 DIAGNOSIS — R2681 Unsteadiness on feet: Secondary | ICD-10-CM | POA: Insufficient documentation

## 2021-10-21 NOTE — Therapy (Signed)
OUTPATIENT PHYSICAL THERAPY TREATMENT NOTE   Patient Name: Maria Nelson MRN: [id number] DOB:Dec 17, 1955, 66 y.o., female Today's Date: 10/21/2021  PCP: Madalyn Rob, MD REFERRING PROVIDER: Sid Falcon, MD  END OF SESSION:   PT End of Session - 10/21/21 1405     Visit Number 6    Number of Visits 17    Date for PT Re-Evaluation 11/11/21    Authorization Type UHC MCR    Authorization Time Period FOTO v6, v10, kx mod v15    Progress Note Due on Visit 10    PT Start Time 1405    PT Stop Time 1445    PT Time Calculation (min) 40 min    Activity Tolerance Patient tolerated treatment well    Behavior During Therapy [facility] for tasks assessed/performed                 Past Medical History:  Diagnosis Date   ANGIOEDEMA 12/16/2009   Asthma    Carpal tunnel syndrome 06/23/2011   Patient notes history of bilateral carpal tunnel syndrome.  Now has symptoms of right hand carpal tunnel.    COPD (chronic obstructive pulmonary disease) ([place])    secondary to tobacco use // No PFTs on file   COVID-19    ETOH abuse    Pt stopped in 3/08   Gastrointestinal hemorrhage    secondary to PUD on March 2008   OSA on CPAP    noncompliant with CPAP (2/2 it being depressing)   Peptic ulcer disease    +H. pylori (antigen)- Dr. Olevia Perches- treated   STRESS INCONTINENCE 01/24/2007   Tobacco abuse    quit in 2010   Urinary incontinence    Urticaria    Past Surgical History:  Procedure Laterality Date   BREAST EXCISIONAL BIOPSY Right 1994   MANDIBLE SURGERY     due to trauma   TUBAL LIGATION     Patient Active Problem List   Diagnosis Date Noted   Benign paroxysmal positional vertigo 09/03/2021   Colon cancer screening 07/14/2021   Healthcare maintenance 01/28/2021   Hyperlipidemia 11/22/2019   UTI (urinary tract infection) 11/22/2019   Adnexal pain 09/06/2018   Rash 05/11/2018   Paroxysmal atrial fibrillation ([place]) 08/24/2017   Dyspepsia 08/24/2017   Knee pain, left  08/16/2016   Back pain 06/23/2016   Urticaria 07/01/2015   Asthma with COPD 03/04/2015   Allergic rhinitis with nonallergic component 03/04/2015   COPD (chronic obstructive pulmonary disease) ([place]) 06/19/2014   Blood pressure elevated without history of HTN 03/05/2014   HNP (herniated nucleus pulposus), lumbar 04/16/2013   Tenosynovitis, de Quervain 11/15/2012   Morbid obesity with BMI of 45.0-49.9, adult ([place]) 09/08/2012   Carpal tunnel syndrome 06/23/2011   Shoulder pain 09/02/2010   Sleep apnea 09/02/2010   GERD 12/24/2009   Urticaria/angioedema 12/16/2009   STRESS INCONTINENCE 01/24/2007   History of peptic ulcer disease 07/26/2006    REFERRING DIAG: H81.10 (ICD-10-CM) - Benign paroxysmal positional vertigo, unspecified laterality  THERAPY DIAG:  Dizziness and giddiness  Muscle weakness (generalized)  Chronic left shoulder pain  Unsteadiness on feet  Rationale for Evaluation and Treatment Rehabilitation  PERTINENT HISTORY: asthma, COPD, morbid obesity  SUBJECTIVE: Pt reports doing well today, only reporting Lt shoulder pain at night. She also reports some continued dizziness with turning in bed at night, but not during the day.   PAIN:  Are you having pain? Yes: NPRS scale: 0/10 Pain location: Lt shoulder Pain description: throbbing Aggravating factors: lifting Lt  arm overhead, laying on Lt arm, and lifting things Relieving factors: rest   OBJECTIVE: (objective measures completed at initial evaluation unless otherwise dated)   DIAGNOSTIC FINDINGS:  08/19/2021: MR Shoulder Left with or without Contrast: IMPRESSION: Mild-to-moderate supraspinatus and infraspinatus tendinopathy appears worse in the supraspinatus. There is a tiny interstitial tear of the far lateral supraspinatus but no full-thickness tendon tear or retraction.   Mild acromioclavicular osteoarthritis.   Small volume of subacromial/subdeltoid fluid compatible with bursitis.   PATIENT SURVEYS:   FOTO 57%, predicted 67% in 10 visits 10/21/2021:     COGNITION: Overall cognitive status: Within functional limits for tasks assessed     SENSATION: Not tested   POSTURE: rounded shoulders and forward head   PALPATION: TTP to supraspinatus musculotendinous junction              CERVICAL ROM:    Active ROM AROM (deg) eval AROM (Deg) 10/02/2021  Flexion WNL WNL  Extension WNL, dizzy WNL  Right lateral flexion WNL WNL  Left lateral flexion 50%, dizzy WNL, minor dizziness  Right rotation WNL WNL  Left rotation 60% WNL   (Blank rows = not tested)   UPPER EXTREMITY ROM:   Active ROM Right eval Left eval Left 10/02/2021  Shoulder flexion WNL 120p! 80p!  Shoulder abduction WNL 85p! 170p!  Shoulder internal rotation WNL 20p! 78  Shoulder external rotation WNL 75 90   (Blank rows = not tested)   UPPER EXTREMITY MMT:   MMT Right eval Left eval Left 10/02/2021  Shoulder flexion 5/5 4/5p! 5/5  Shoulder abduction 5/5 4/5p! 5/5  Shoulder internal rotation 5/5 5/5   Shoulder external rotation 5/5 4+/5 5/5  Middle trapezius 3/5 3/5 4+/5  Lower trapezius 3/5 3/5 4+/5  Latissimus dorsi 3/5 3/5 4+/5  Elbow flexion 5/5 5/5   Elbow extension 5/5 5/5   Grip strength        (Blank rows = not tested)   SPECIAL TESTS:  Dix-Hallpike: (+) on Rt Hawkins-Kennedy: (+) on Lt Neer's: (+) on Lt Painful arc: (+) on Lt ERLS at 90 degrees abduction: (-)  09/16/2021: head thrust maneuver for VOR: (-) for nystagmus or delayed correction Lateral smooth pursuit: WNL Vertical smooth pursuit: mild delay with downward focus BIL  09/18/2021: Rt torsional upbeating nystagmus noted with Rt Dix-Hallpike: Positive for Rt posterior canal hyperactivity, Lt hypofunction       TODAY'S TREATMENT:    [facility] Adult PT Treatment:                                                DATE: 10/21/2021 Therapeutic Exercise: Standing Lt shoulder ER and scapular retraction with RTB 3x10 Bent-over Lt shoulder  pendulums 3x30sec Seated low rows with 30# and chin tuck hold x10 Seated high rows with 30# and chin tuck hold x10 Seated lat pull-downs with 30# and chin tuck hold x10 Seated shoulder rolls 2x10 forward and backward Manual Therapy: N/A Neuromuscular re-ed: N/A Therapeutic Activity: Lt shoulder scaption functional lift with 5# dumbbell from counter to chest-height shelf, to overhead shelf, and back again 2x10 Seated face-pulls with 10# plate 3x8 Modalities: N/A Self Care: N/A   [facility] Adult PT Treatment:  DATE: 10/15/2021 Therapeutic Exercise: Seated BIL shoulder scaption with 4# dumbbells 2x10 Sidelying Lt shoulder ER with 4# dumbbell 3x10 Seated low rows with 25# and chin tuck hold x10 Seated high rows with 25# and chin tuck hold x10 Seated lat pull-downs with 25# and chin tuck hold x10 Seated shoulder rolls 2x10 forward and backward Standing BIL shoulder extension with 7# cables 3x10 Standing arm circle angels 3x5 Manual Therapy: N/A Neuromuscular re-ed: N/A Therapeutic Activity: N/A Modalities: N/A Self Care: N/A  [facility] Adult PT Treatment:                                                DATE: 10/02/2021 Therapeutic Exercise: Standing BIL shoulder ER with scapular retraction with 3# cables 3x8 Standing shoulder rolls 2x10 forward and backward Standing BIL shoulder scaption with 3# dumbbells 3x10 Seated lat pull-downs with 35# cable 3x10 Manual Therapy: N/A Neuromuscular re-ed: Dix-hallpike (+) on Lt, (-) on Rt Lt Epley's maneuver x1 Therapeutic Activity: Re-assessment of objective measures with pt education Modalities: N/A Self Care: N/A       PATIENT EDUCATION:  Education details: Pt educated on potential underlying pathophysiology behind her pain presentation, FOTO, POC, prognosis, and HEP Person educated: Patient Education method: Explanation, Demonstration, and Handouts Education comprehension: verbalized  understanding and returned demonstration     HOME EXERCISE PROGRAM: Access Code: [email or web address] URL: [email or web address] Date: 09/09/2021 Prepared by: Vanessa [place]   Exercises - Shoulder External Rotation and Scapular Retraction with Resistance  - 1 x daily - 7 x weekly - 3 sets - 10 reps - 3-sec hold - Standing Shoulder Row with Anchored Resistance  - 1 x daily - 7 x weekly - 3 sets - 10 reps - 3-sec hold - Shoulder Scaption AAROM with Dowel  - 1 x daily - 7 x weekly - 2 sets - 10 reps - 5-sec hold - Seated Gaze Stabilization with Head Nod  - 1 x daily - 7 x weekly - 2 sets - 20 reps - Seated Gaze Stabilization with Head Rotation  - 1 x daily - 7 x weekly - 2 sets - 20 reps  Added 09/18/2021:  - Self-Epley Maneuver Right Ear  - 1 x daily - 7 x weekly - 1 sets - 1 reps   ASSESSMENT:   CLINICAL IMPRESSION: Pt responded well to progressed exercises today, demonstrating good form and no increase in pain with increase loads with overhead lifts. She will continue to benefit from skilled PT to address her primary impairments and return to her prior level of function with less limitation.     OBJECTIVE IMPAIRMENTS Abnormal gait, decreased balance, decreased coordination, decreased mobility, difficulty walking, decreased ROM, decreased strength, dizziness, hypomobility, increased edema, impaired flexibility, impaired UE functional use, improper body mechanics, postural dysfunction, and pain.    ACTIVITY LIMITATIONS carrying, lifting, bending, sitting, standing, squatting, sleeping, bathing, dressing, and reach over head   PARTICIPATION LIMITATIONS: meal prep, cleaning, laundry, driving, shopping, community activity, and yard work   PERSONAL FACTORS 3+ comorbidities: asthma, COPD, morbid obesity  are also affecting patient's functional outcome.        GOALS: Goals reviewed with patient? Yes   SHORT TERM GOALS: Target date: 10/07/2021    Pt will report understanding and  adherence to her HEP in order to promote independence in the management of her primary impairments. Baseline: HEP provided at  eval 10/02/2021: Pt reports daily adherence to her HEP Goal status: ACHIEVED     LONG TERM GOALS: Target date: 11/03/2021   Pt will achieve a FOTO score of 67% in order to demonstrate improved functional ability as it relates to her primary impairments. Baseline: 57% 10/21/2021: 58%S Goal status: INITIAL   2.  Pt will achieve Lt shoulder flexion AROM of 150 degrees or higher in order to reach into overhead cabinets with less limitation. Baseline: 120 degrees 10/02/2021: 180 degrees Goal status: ACHIEVED   3.  Pt will report ability to sleep through the night with no sleep disturbances due to pain. Baseline: Pt reports being woken every hour due to pain. 10/02/2021: Pt reports being woken once per night due to pain Goal status: ACHIEVED   4.  Pt will demonstrate WNL global cervical AROM without exacerbation of dizziness in order to get dressed without dizziness. Baseline: See AROM chart, dizziness with cervical extension and Lt lateral flexion 10/02/2021: See updated AROM chart Goal status: IN PROGRESS   5.  Pt will achieve global shoulder and parascapular strength of 4+/5 or greater in order to promote prophylaxis of future mechanical pain symptoms. Baseline: See MMT chart 10/02/2021: See updated MMT chart Goal status: ACHIEVED         PLAN: PT FREQUENCY: 2x/week   PT DURATION: 8 weeks   PLANNED INTERVENTIONS: Therapeutic exercises, Therapeutic activity, Neuromuscular re-education, Balance training, Gait training, Patient/Family education, Joint mobilization, Stair training, Vestibular training, Canalith repositioning, Aquatic Therapy, Dry Needling, Electrical stimulation, Cryotherapy, Moist heat, Taping, Vasopneumatic device, Traction, Biofeedback, Ionotophoresis '4mg'$ /ml Dexamethasone, Manual therapy, and Re-evaluation   PLAN FOR NEXT SESSION: progress DNF/  parascapular/ RTC strengthening, gaze stabilization exercises/ Sheral Apley, PT, DPT 10/21/21 2:48 PM

## 2021-10-22 ENCOUNTER — Ambulatory Visit: Payer: Medicare Other

## 2021-10-28 ENCOUNTER — Telehealth: Payer: Self-pay

## 2021-10-28 ENCOUNTER — Ambulatory Visit: Payer: Medicare Other

## 2021-10-28 NOTE — Telephone Encounter (Signed)
Patient called in - DOB verified - requesting medication refill. Patient was advised follow up office visit is required for future refills.  Follow up appt: 11/02/21 @ 11:30 am w/Dr. Simona Huh. Patient verbalized understanding, no further questions.

## 2021-10-30 ENCOUNTER — Ambulatory Visit: Payer: Medicare Other

## 2021-10-30 DIAGNOSIS — M6281 Muscle weakness (generalized): Secondary | ICD-10-CM

## 2021-10-30 DIAGNOSIS — R42 Dizziness and giddiness: Secondary | ICD-10-CM | POA: Diagnosis not present

## 2021-10-30 DIAGNOSIS — R2681 Unsteadiness on feet: Secondary | ICD-10-CM

## 2021-10-30 DIAGNOSIS — M25512 Pain in left shoulder: Secondary | ICD-10-CM | POA: Diagnosis not present

## 2021-10-30 DIAGNOSIS — G8929 Other chronic pain: Secondary | ICD-10-CM | POA: Diagnosis not present

## 2021-10-30 NOTE — Therapy (Signed)
OUTPATIENT PHYSICAL THERAPY TREATMENT NOTE   Patient Name: Maria Nelson MRN: [id number] DOB:11-24-1955, 66 y.o., female Today's Date: 10/30/2021  PCP: Madalyn Rob, MD REFERRING PROVIDER: Sid Falcon, MD  END OF SESSION:   PT End of Session - 10/30/21 1343     Visit Number 7    Number of Visits 17    Date for PT Re-Evaluation 11/11/21    Authorization Type [employer] MCR    Authorization Time Period FOTO v6, v10, kx mod v15    Progress Note Due on Visit 10    PT Start Time 1344    PT Stop Time 1422    PT Time Calculation (min) 38 min    Activity Tolerance Patient tolerated treatment well    Behavior During Therapy [facility] for tasks assessed/performed                  Past Medical History:  Diagnosis Date   ANGIOEDEMA 12/16/2009   Asthma    Carpal tunnel syndrome 06/23/2011   Patient notes history of bilateral carpal tunnel syndrome.  Now has symptoms of right hand carpal tunnel.    COPD (chronic obstructive pulmonary disease) ([place])    secondary to tobacco use // No PFTs on file   COVID-19    ETOH abuse    Pt stopped in 3/08   Gastrointestinal hemorrhage    secondary to PUD on March 2008   OSA on CPAP    noncompliant with CPAP (2/2 it being depressing)   Peptic ulcer disease    +H. pylori (antigen)- Dr. Olevia Perches- treated   STRESS INCONTINENCE 01/24/2007   Tobacco abuse    quit in 2010   Urinary incontinence    Urticaria    Past Surgical History:  Procedure Laterality Date   BREAST EXCISIONAL BIOPSY Right 1994   MANDIBLE SURGERY     due to trauma   TUBAL LIGATION     Patient Active Problem List   Diagnosis Date Noted   Benign paroxysmal positional vertigo 09/03/2021   Colon cancer screening 07/14/2021   Healthcare maintenance 01/28/2021   Hyperlipidemia 11/22/2019   UTI (urinary tract infection) 11/22/2019   Adnexal pain 09/06/2018   Rash 05/11/2018   Paroxysmal atrial fibrillation ([place]) 08/24/2017   Dyspepsia 08/24/2017   Knee pain, left  08/16/2016   Back pain 06/23/2016   Urticaria 07/01/2015   Asthma with COPD 03/04/2015   Allergic rhinitis with nonallergic component 03/04/2015   COPD (chronic obstructive pulmonary disease) ([place]) 06/19/2014   Blood pressure elevated without history of HTN 03/05/2014   HNP (herniated nucleus pulposus), lumbar 04/16/2013   Tenosynovitis, de Quervain 11/15/2012   Morbid obesity with BMI of 45.0-49.9, adult ([place]) 09/08/2012   Carpal tunnel syndrome 06/23/2011   Shoulder pain 09/02/2010   Sleep apnea 09/02/2010   GERD 12/24/2009   Urticaria/angioedema 12/16/2009   STRESS INCONTINENCE 01/24/2007   History of peptic ulcer disease 07/26/2006    REFERRING DIAG: H81.10 (ICD-10-CM) - Benign paroxysmal positional vertigo, unspecified laterality  THERAPY DIAG:  Dizziness and giddiness  Muscle weakness (generalized)  Chronic left shoulder pain  Unsteadiness on feet  Rationale for Evaluation and Treatment Rehabilitation  PERTINENT HISTORY: asthma, COPD, morbid obesity  SUBJECTIVE: Pt reports no pain today, adding that she continues to notice improvement in her shoulder strength and pain. Pt denies any dizziness over the past week.  PAIN:  Are you having pain? Yes: NPRS scale: 0/10 Pain location: Lt shoulder Pain description: throbbing Aggravating factors: lifting Lt arm overhead, laying  on Lt arm, and lifting things Relieving factors: rest   OBJECTIVE: (objective measures completed at initial evaluation unless otherwise dated)   DIAGNOSTIC FINDINGS:  08/19/2021: MR Shoulder Left with or without Contrast: IMPRESSION: Mild-to-moderate supraspinatus and infraspinatus tendinopathy appears worse in the supraspinatus. There is a tiny interstitial tear of the far lateral supraspinatus but no full-thickness tendon tear or retraction.   Mild acromioclavicular osteoarthritis.   Small volume of subacromial/subdeltoid fluid compatible with bursitis.   PATIENT SURVEYS:  FOTO 57%,  predicted 67% in 10 visits 10/21/2021: 58%     COGNITION: Overall cognitive status: Within functional limits for tasks assessed     SENSATION: Not tested   POSTURE: rounded shoulders and forward head   PALPATION: TTP to supraspinatus musculotendinous junction              CERVICAL ROM:    Active ROM AROM (deg) eval AROM (Deg) 10/02/2021  Flexion WNL WNL  Extension WNL, dizzy WNL  Right lateral flexion WNL WNL  Left lateral flexion 50%, dizzy WNL, minor dizziness  Right rotation WNL WNL  Left rotation 60% WNL   (Blank rows = not tested)   UPPER EXTREMITY ROM:   Active ROM Right eval Left eval Left 10/02/2021 Left 10/30/2021  Shoulder flexion WNL 120p! 180p! 176, minor p! (2/10)  Shoulder abduction WNL 85p! 170p! 182, minor pain (2/10)  Shoulder internal rotation WNL 20p! 78   Shoulder external rotation WNL 75 90    (Blank rows = not tested)   UPPER EXTREMITY MMT:   MMT Right eval Left eval Left 10/02/2021  Shoulder flexion 5/5 4/5p! 5/5  Shoulder abduction 5/5 4/5p! 5/5  Shoulder internal rotation 5/5 5/5   Shoulder external rotation 5/5 4+/5 5/5  Middle trapezius 3/5 3/5 4+/5  Lower trapezius 3/5 3/5 4+/5  Latissimus dorsi 3/5 3/5 4+/5  Elbow flexion 5/5 5/5   Elbow extension 5/5 5/5   Grip strength        (Blank rows = not tested)   SPECIAL TESTS:  Dix-Hallpike: (+) on Rt Hawkins-Kennedy: (+) on Lt Neer's: (+) on Lt Painful arc: (+) on Lt ERLS at 90 degrees abduction: (-)  09/16/2021: head thrust maneuver for VOR: (-) for nystagmus or delayed correction Lateral smooth pursuit: WNL Vertical smooth pursuit: mild delay with downward focus BIL  09/18/2021: Rt torsional upbeating nystagmus noted with Rt Dix-Hallpike: Positive for Rt posterior canal hyperactivity, Lt hypofunction       TODAY'S TREATMENT:     [facility] Adult PT Treatment:                                                DATE: 10/30/2021 Therapeutic Exercise: Supine BIL shoulder flexion with  3# dumbbells 2x10 Standing BIL straight-arm horizontal abduction with 3# cables at Free Motion machine 3x10 Standing arm circles angels 2x5 Standing BIL shoulder ER at 0 degrees abduction with 3# cables at Free Motion machine 3x10 Standing BIL shoulder extension with 7# cables at Free Motion machine 3x10 Standing shoulder horizontal adduction stretch x74mn BIL Seated low rows with 30# and chin tuck hold x10 Seated high rows with 30# and chin tuck hold x10 Seated lat pull-downs with 30# and chin tuck hold x10 Seated shoulder rolls 2x10 forward and backward Manual Therapy: N/A Neuromuscular re-ed: N/A Therapeutic Activity: N/A Modalities: N/A Self Care: N/A   [facility]  [facility]: [facility]20[facility] Therapeutic Exercise: Standing Lt shoulder ER and scapular retraction with RTB 3x10 Bent-over Lt shoulder pendulums 3x30sec Seated low rows with 30# and chin tuck hold x10 Seated high rows with 30# and chin tuck hold x10 Seated lat pull-downs with 30# and chin tuck hold x10 Seated shoulder rolls 2x10 forward and backward Manual Therapy: N/A Neuromuscular re-ed: N/A Therapeutic Activity: Lt shoulder scaption functional lift with 5# dumbbell from counter to chest-height shelf, to overhead shelf, and back again 2x10 Seated face-pulls with 10# plate 3x8 Modalities: N/A Self Care: N/A   [facility] Adult PT Treatment:                                                DATE: 10/15/2021 Therapeutic Exercise: Seated BIL shoulder scaption with 4# dumbbells 2x10 Sidelying Lt shoulder ER with 4# dumbbell 3x10 Seated low rows with 25# and chin tuck hold x10 Seated high rows with 25# and chin tuck hold x10 Seated lat pull-downs with 25# and chin tuck hold x10 Seated shoulder rolls 2x10 forward and backward Standing BIL shoulder extension with 7# cables 3x10 Standing arm circle angels 3x5 Manual Therapy: N/A Neuromuscular re-ed: N/A Therapeutic  Activity: N/A Modalities: N/A Self Care: N/A       PATIENT EDUCATION:  Education details: Pt educated on potential underlying pathophysiology behind her pain presentation, FOTO, POC, prognosis, and HEP Person educated: Patient Education method: Explanation, Demonstration, and Handouts Education comprehension: verbalized understanding and returned demonstration     HOME EXERCISE PROGRAM: Access Code: [email or web address] URL: [email or web address] Date: 09/09/2021 Prepared by: Vanessa Panama   Exercises - Shoulder External Rotation and Scapular Retraction with Resistance  - 1 x daily - 7 x weekly - 3 sets - 10 reps - 3-sec hold - Standing Shoulder Row with Anchored Resistance  - 1 x daily - 7 x weekly - 3 sets - 10 reps - 3-sec hold - Shoulder Scaption AAROM with Dowel  - 1 x daily - 7 x weekly - 2 sets - 10 reps - 5-sec hold - Seated Gaze Stabilization with Head Nod  - 1 x daily - 7 x weekly - 2 sets - 20 reps - Seated Gaze Stabilization with Head Rotation  - 1 x daily - 7 x weekly - 2 sets - 20 reps  Added 09/18/2021:  - Self-Epley Maneuver Right Ear  - 1 x daily - 7 x weekly - 1 sets - 1 reps   ASSESSMENT:   CLINICAL IMPRESSION: Upon re-assessment, the pt's shoulder ROM and associated pain continue to improve. She tolerated all exercises well today, demonstrating good form and no increase in pain. She will continue to benefit from skilled PT to address her primary impairments and return to her prior level of function with less limitation.     OBJECTIVE IMPAIRMENTS Abnormal gait, decreased balance, decreased coordination, decreased mobility, difficulty walking, decreased ROM, decreased strength, dizziness, hypomobility, increased edema, impaired flexibility, impaired UE functional use, improper body mechanics, postural dysfunction, and pain.    ACTIVITY LIMITATIONS carrying, lifting, bending, sitting, standing, squatting, sleeping, bathing, dressing, and reach over head    PARTICIPATION LIMITATIONS: meal prep, cleaning, laundry, driving, shopping, community activity, and yard work   PERSONAL FACTORS 3+ comorbidities: asthma, COPD, morbid obesity  are also affecting patient's functional outcome.        GOALS: Goals reviewed with patient? Yes  SHORT TERM GOALS: Target date: 10/07/2021    Pt will report understanding and adherence to her HEP in order to promote independence in the management of her primary impairments. Baseline: HEP provided at eval 10/02/2021: Pt reports daily adherence to her HEP Goal status: ACHIEVED     LONG TERM GOALS: Target date: 11/03/2021   Pt will achieve a FOTO score of 67% in order to demonstrate improved functional ability as it relates to her primary impairments. Baseline: 57% 10/21/2021: 58% Goal status: INITIAL   2.  Pt will achieve Lt shoulder flexion AROM of 150 degrees or higher in order to reach into overhead cabinets with less limitation. Baseline: 120 degrees 10/02/2021: 180 degrees Goal status: ACHIEVED   3.  Pt will report ability to sleep through the night with no sleep disturbances due to pain. Baseline: Pt reports being woken every hour due to pain. 10/02/2021: Pt reports being woken once per night due to pain Goal status: ACHIEVED   4.  Pt will demonstrate WNL global cervical AROM without exacerbation of dizziness in order to get dressed without dizziness. Baseline: See AROM chart, dizziness with cervical extension and Lt lateral flexion 10/02/2021: See updated AROM chart Goal status: IN PROGRESS   5.  Pt will achieve global shoulder and parascapular strength of 4+/5 or greater in order to promote prophylaxis of future mechanical pain symptoms. Baseline: See MMT chart 10/02/2021: See updated MMT chart Goal status: ACHIEVED         PLAN: PT FREQUENCY: 2x/week   PT DURATION: 8 weeks   PLANNED INTERVENTIONS: Therapeutic exercises, Therapeutic activity, Neuromuscular re-education, Balance training,  Gait training, Patient/Family education, Joint mobilization, Stair training, Vestibular training, Canalith repositioning, Aquatic Therapy, Dry Needling, Electrical stimulation, Cryotherapy, Moist heat, Taping, Vasopneumatic device, Traction, Biofeedback, Ionotophoresis '4mg'$ /ml Dexamethasone, Manual therapy, and Re-evaluation   PLAN FOR NEXT SESSION: progress DNF/ parascapular/ RTC strengthening, gaze stabilization exercises/ Sheral Apley, PT, DPT 10/30/21 2:22 PM

## 2021-10-30 NOTE — Progress Notes (Unsigned)
FOLLOW UP Date of Service/Encounter:  11/02/21   Subjective:  Maria Nelson (DOB: [birth year]) is a 66 y.o. female who returns to the [facility] on 11/02/2021 in re-evaluation of the following: asthma/copd overlap, chronic urticaria, allergic rhinitis and reflux History obtained from: chart review and patient.  For Review, LV was on 06/19/21  with Dr. Nelva Bush seen for routine follow-up.   At that visit, patient reported having had a recent allergic reaction "involving tongue and states its been tingling like it is about to swell but has not.  Denied other systemic symptoms.  Had eaten cube steak and gravy with spicy collard's for dinner the night before.  She also reported an episode of hives and facial swelling which required urgent care visit on 05/25/2021 where she was treated with Solu-Medrol injection after eating spaghetti with meat sauce the night prior.  Reported increased shortness of breath for the past 2 months.  An alpha gal panel as well as a tryptase level were ordered.  She was started on Arnuity and given a prednisone burst. 06/19/21-Her alpha gal level was negative.  Total IgE 758.  Normal baseline tryptase of 7.7  Today presents for follow-up. Asthma-COPD: Feels like her medication is not working as long as it used to work. She is unable to walk a few blocks in the heat without being completely out of breath.  She is having a hard time breathing in the heat.   Even doing things in her home, she feels like she is overexerting herself.  She does occasionally use her Arnuity, but will only use 1 puff on days when her breathing is at its worst.  Typically using for 1 day at a time.  Last used 3 days ago. She has never seen a pulmonologist.  She has a 1 ppd x 30 year smoking history. She does feel that using her albuterol helps her breath.    Hives/angioedema: has had 2 episodes of swelling since last visit.  Unilateral tongue swelling that she took  benadryl for on 1 occasion.  She also had lip swelling that was minor on a second occasion.  She is taking the cetirizine daily, but never increased to twice daily for prevention of symptoms.  Otherwise rhinitis and reflux are controlled.  Her main complaint is her difficulty breathing and taking longer to catch her breath.  This is a chronic concern.   Allergies as of 11/02/2021       Reactions   Bee Venom Anaphylaxis   Aspirin Nausea Only   Other Rash   EKG pads cause burning sensation as soon as applied        Medication List        Accurate as of November 02, 2021  1:30 PM. If you have any questions, ask your nurse or doctor.          albuterol (2.5 MG/3ML) 0.083% nebulizer solution Commonly known as: PROVENTIL Use 1 vial in nebulizer up to 3 times a day when albuterol inhaler not working. If no better or getting worse, call 911 or physician line   albuterol 108 (90 Base) MCG/ACT inhaler Commonly known as: Ventolin HFA Inhale 2 puffs into the lungs every 6 (six) hours as needed for wheezing or shortness of breath.   Arnuity Ellipta 100 MCG/ACT Aepb Generic drug: Fluticasone Furoate Inhale 1 puff into the lungs daily.   Breztri Aerosphere 160-9-4.8 MCG/ACT Aero Generic drug: Budeson-Glycopyrrol-Formoterol Inhale 2 puffs into the lungs 2 (two) times daily.  cetirizine 10 MG tablet Commonly known as: ZYRTEC Take 1 tablet (10 mg total) by mouth daily.   EPINEPHrine 0.3 mg/0.3 mL Soaj injection Commonly known as: EpiPen 2-Pak Inject 0.3 mg into the muscle as needed (for allergic reaction).   levocetirizine 5 MG tablet Commonly known as: XYZAL Take 1 tablet (5 mg total) by mouth every evening.   pantoprazole 40 MG tablet Commonly known as: PROTONIX Take 1 tablet (40 mg total) by mouth daily.       Past Medical History:  Diagnosis Date   ANGIOEDEMA 12/16/2009   Asthma    Carpal tunnel syndrome 06/23/2011   Patient notes history of bilateral carpal tunnel  syndrome.  Now has symptoms of right hand carpal tunnel.    COPD (chronic obstructive pulmonary disease) ([place])    secondary to tobacco use // No PFTs on file   COVID-19    ETOH abuse    Pt stopped in 3/08   Gastrointestinal hemorrhage    secondary to PUD on March 2008   OSA on CPAP    noncompliant with CPAP (2/2 it being depressing)   Peptic ulcer disease    +H. pylori (antigen)- Dr. Olevia Perches- treated   STRESS INCONTINENCE 01/24/2007   Tobacco abuse    quit in 2010   Urinary incontinence    Urticaria    Past Surgical History:  Procedure Laterality Date   BREAST EXCISIONAL BIOPSY Right 1994   MANDIBLE SURGERY     due to trauma   TUBAL LIGATION     Otherwise, there have been no changes to her past medical history, surgical history, family history, or social history.  ROS: All others negative except as noted per HPI.   Objective:  BP 118/70   Pulse 67   Temp 98 F (36.7 C) (Temporal)   Resp 20   Wt 263 lb 6.4 oz (119.5 kg)   LMP 04/19/2004 (Approximate)   SpO2 93%   BMI 51.44 kg/m  Body mass index is 51.44 kg/m. Physical Exam: General Appearance:  Alert, cooperative, no distress, appears stated age  Head:  Normocephalic, without obvious abnormality, atraumatic  Eyes:  Conjunctiva clear, EOM's intact  Nose: Nares normal, hypertrophic turbinates, normal mucosa, no visible anterior polyps, and septum midline  Throat: Lips, tongue normal; teeth and gums normal, normal posterior oropharynx  Neck: Supple, symmetrical  Lungs:   clear to auscultation bilaterally, Respirations unlabored, no coughing  Heart:  regular rate and rhythm and no murmur, Appears well perfused  Extremities: No edema  Skin: Skin color, texture, turgor normal, no rashes or lesions on visualized portions of skin  Neurologic: No gross deficits   Spirometry:  Tracings reviewed. Her effort: Good reproducible efforts. FVC: 1.28L FEV1: 0.84L, 53% predicted FEV1/FVC ratio: 85% Interpretation: Spirometry  consistent with possible restrictive disease.  Please see scanned spirometry results for details.   Results similar to previous spirometry.  Assessment/Plan  Patient reporting increased dyspnea and difficulty reading.  Unable to increase oxygen above 93% on today's encounter.  Discussed referral to pulmonary to determine eligibility for oxygen though she is weary of having home oxygen due to increased cost of electricity and the economic burden this would have for her. Reviewed her labs from last visit which showed AEC of 500.  Discussed eligibility for injectable asthma biologic for anti-IL-5 and she was interested.   Asthma/COPD Continue Breztri 2 puffs twice a day with spacer to help prevent cough and wheeze For Flares: Add Arnuity 100 1 puff daily for 1-2 weeks  or until symptoms resolve  Continue albuterol 2 puffs every 4 hours as needed for cough or wheeze OR albuterol via nebulizer 1 unit dose every 4-6 hours as needed for cough, wheeze, tightness in chest, or shortness of breath. You may use albuterol 2 puffs 5-15 minutes before activity  Fasenra sample provided in clinic today-you will hear from our [place] regarding approval process!  Referral to Pulmonary - optimization of COPD and address need for supplemental oxygen  Chronic hives and swelling-stable Continuine cetirizine 10 mg twice a day at this time  Allergic rhinitis-controlled Use azelastine 2 sprays twice a day as needed for a runny nose Continue cetirizine as listed above Consider saline nasal rinses as needed for nasal symptoms. Use this before any medicated nasal sprays for best result  Reflux-controlled Continue Pantoprazole 40 mg once a day as previously prescribed.  Continue dietary and lifestyle modifications as listed below  Call the clinic if this treatment plan is not working well for you  Schedule a follow up appointment in 3 months or sooner if needed  Sigurd Sos, MD   [facility]

## 2021-11-02 ENCOUNTER — Encounter: Payer: Self-pay | Admitting: Internal Medicine

## 2021-11-02 ENCOUNTER — Ambulatory Visit (INDEPENDENT_AMBULATORY_CARE_PROVIDER_SITE_OTHER): Payer: Medicare Other | Admitting: Internal Medicine

## 2021-11-02 VITALS — BP 118/70 | HR 67 | Temp 98.0°F | Resp 20 | Wt 263.4 lb

## 2021-11-02 DIAGNOSIS — J3089 Other allergic rhinitis: Secondary | ICD-10-CM

## 2021-11-02 DIAGNOSIS — L501 Idiopathic urticaria: Secondary | ICD-10-CM

## 2021-11-02 DIAGNOSIS — J449 Chronic obstructive pulmonary disease, unspecified: Secondary | ICD-10-CM | POA: Diagnosis not present

## 2021-11-02 DIAGNOSIS — J4550 Severe persistent asthma, uncomplicated: Secondary | ICD-10-CM

## 2021-11-02 DIAGNOSIS — T783XXD Angioneurotic edema, subsequent encounter: Secondary | ICD-10-CM

## 2021-11-02 MED ORDER — EPINEPHRINE 0.3 MG/0.3ML IJ SOAJ: 0.3000 mg | INTRAMUSCULAR | 1 refills | Status: DC | PRN

## 2021-11-02 MED ORDER — BENRALIZUMAB 30 MG/ML ~~LOC~~ SOSY
30.0000 mg | PREFILLED_SYRINGE | Freq: Once | SUBCUTANEOUS | Status: AC
Start: 2021-11-02 — End: 2021-11-02
  Administered 2021-11-02: 30 mg via SUBCUTANEOUS

## 2021-11-02 MED ORDER — ALBUTEROL SULFATE HFA 108 (90 BASE) MCG/ACT IN AERS: 2.0000 | INHALATION_SPRAY | Freq: Four times a day (QID) | RESPIRATORY_TRACT | 1 refills | Status: DC | PRN

## 2021-11-02 MED ORDER — BREZTRI AEROSPHERE 160-9-4.8 MCG/ACT IN AERO: 2.0000 | INHALATION_SPRAY | Freq: Two times a day (BID) | RESPIRATORY_TRACT | 3 refills | Status: DC

## 2021-11-02 NOTE — Patient Instructions (Addendum)
Asthma/COPD Continue Breztri 2 puffs twice a day with spacer to help prevent cough and wheeze For Flares: Add Arnuity 100 1 puff daily for 1-2 weeks or until symptoms resolve Continue albuterol 2 puffs every 4 hours as needed for cough or wheeze OR albuterol via nebulizer 1 unit dose every 4-6 hours as needed for cough, wheeze, tightness in chest, or shortness of breath. You may use albuterol 2 puffs 5-15 minutes before activity Fasenra sample provided in clinic today-you will hear from our [place] regarding approval process! Referral to Pulmonary - optimization of COPD and address need for supplemental oxygen  Chronic hives and swelling Continuine cetirizine 10 mg twice a day at this time  Allergic rhinitis Use azelastine 2 sprays twice a day as needed for a runny nose Continue cetirizine as listed above Consider saline nasal rinses as needed for nasal symptoms. Use this before any medicated nasal sprays for best result  Reflux Continue Pantoprazole 40 mg once a day as previously prescribed.  Continue dietary and lifestyle modifications as listed below  Call the clinic if this treatment plan is not working well for you  Schedule a follow up appointment in 3 months or sooner if needed

## 2021-11-05 ENCOUNTER — Telehealth: Payer: Self-pay | Admitting: *Deleted

## 2021-11-05 NOTE — Telephone Encounter (Signed)
T/C to patient advised approval for Berna Bue and submit to [facility] and will reach out to schedule next inj after delivery set up

## 2021-11-05 NOTE — Telephone Encounter (Signed)
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 Thanks! Sample given today in clinic.

## 2021-11-05 NOTE — Telephone Encounter (Signed)
Thank you :)

## 2021-11-06 ENCOUNTER — Ambulatory Visit: Payer: Medicare Other

## 2021-11-06 DIAGNOSIS — M6281 Muscle weakness (generalized): Secondary | ICD-10-CM

## 2021-11-06 DIAGNOSIS — R42 Dizziness and giddiness: Secondary | ICD-10-CM | POA: Diagnosis not present

## 2021-11-06 DIAGNOSIS — R2681 Unsteadiness on feet: Secondary | ICD-10-CM | POA: Diagnosis not present

## 2021-11-06 DIAGNOSIS — G8929 Other chronic pain: Secondary | ICD-10-CM

## 2021-11-06 DIAGNOSIS — M25512 Pain in left shoulder: Secondary | ICD-10-CM | POA: Diagnosis not present

## 2021-11-06 NOTE — Therapy (Signed)
OUTPATIENT PHYSICAL THERAPY TREATMENT NOTE   Patient Name: Maria Nelson MRN: [id number] DOB:1956-02-02, 66 y.o., female Today's Date: 11/06/2021  PCP: Madalyn Rob, MD REFERRING PROVIDER: Sid Falcon, MD  END OF SESSION:   PT End of Session - 11/06/21 1348     Visit Number 8    Number of Visits 17    Date for PT Re-Evaluation 11/11/21    Authorization Type [employer] MCR    Authorization Time Period FOTO v6, v10, kx mod v15    Progress Note Due on Visit 10    PT Start Time 1348    PT Stop Time 1426    PT Time Calculation (min) 38 min    Activity Tolerance Patient tolerated treatment well    Behavior During Therapy [facility] for tasks assessed/performed                   Past Medical History:  Diagnosis Date   ANGIOEDEMA 12/16/2009   Asthma    Carpal tunnel syndrome 06/23/2011   Patient notes history of bilateral carpal tunnel syndrome.  Now has symptoms of right hand carpal tunnel.    COPD (chronic obstructive pulmonary disease) ([place])    secondary to tobacco use // No PFTs on file   COVID-19    ETOH abuse    Pt stopped in 3/08   Gastrointestinal hemorrhage    secondary to PUD on March 2008   OSA on CPAP    noncompliant with CPAP (2/2 it being depressing)   Peptic ulcer disease    +H. pylori (antigen)- Dr. Olevia Perches- treated   STRESS INCONTINENCE 01/24/2007   Tobacco abuse    quit in 2010   Urinary incontinence    Urticaria    Past Surgical History:  Procedure Laterality Date   BREAST EXCISIONAL BIOPSY Right 1994   MANDIBLE SURGERY     due to trauma   TUBAL LIGATION     Patient Active Problem List   Diagnosis Date Noted   Benign paroxysmal positional vertigo 09/03/2021   Colon cancer screening 07/14/2021   Healthcare maintenance 01/28/2021   Hyperlipidemia 11/22/2019   UTI (urinary tract infection) 11/22/2019   Adnexal pain 09/06/2018   Rash 05/11/2018   Paroxysmal atrial fibrillation ([place]) 08/24/2017   Dyspepsia 08/24/2017   Knee pain,  left 08/16/2016   Back pain 06/23/2016   Urticaria 07/01/2015   Asthma with COPD 03/04/2015   Allergic rhinitis with nonallergic component 03/04/2015   COPD (chronic obstructive pulmonary disease) ([place]) 06/19/2014   Blood pressure elevated without history of HTN 03/05/2014   HNP (herniated nucleus pulposus), lumbar 04/16/2013   Tenosynovitis, de Quervain 11/15/2012   Morbid obesity with BMI of 45.0-49.9, adult ([place]) 09/08/2012   Carpal tunnel syndrome 06/23/2011   Shoulder pain 09/02/2010   Sleep apnea 09/02/2010   GERD 12/24/2009   Urticaria/angioedema 12/16/2009   STRESS INCONTINENCE 01/24/2007   History of peptic ulcer disease 07/26/2006    REFERRING DIAG: H81.10 (ICD-10-CM) - Benign paroxysmal positional vertigo, unspecified laterality  THERAPY DIAG:  Dizziness and giddiness  Muscle weakness (generalized)  Chronic left shoulder pain  Unsteadiness on feet  Rationale for Evaluation and Treatment Rehabilitation  PERTINENT HISTORY: asthma, COPD, morbid obesity  SUBJECTIVE: Pt reports no shoulder pain today, although she reports continued pain when sleeping. She reports complete remission of dizziness at this point.  PAIN:  Are you having pain? Yes: NPRS scale: 0/10 Pain location: Lt shoulder Pain description: throbbing Aggravating factors: lifting Lt arm overhead, laying on Lt arm,  and lifting things Relieving factors: rest   OBJECTIVE: (objective measures completed at initial evaluation unless otherwise dated)   DIAGNOSTIC FINDINGS:  08/19/2021: MR Shoulder Left with or without Contrast: IMPRESSION: Mild-to-moderate supraspinatus and infraspinatus tendinopathy appears worse in the supraspinatus. There is a tiny interstitial tear of the far lateral supraspinatus but no full-thickness tendon tear or retraction.   Mild acromioclavicular osteoarthritis.   Small volume of subacromial/subdeltoid fluid compatible with bursitis.   PATIENT SURVEYS:  FOTO 57%,  predicted 67% in 10 visits 10/21/2021: 58%     COGNITION: Overall cognitive status: Within functional limits for tasks assessed     SENSATION: Not tested   POSTURE: rounded shoulders and forward head   PALPATION: TTP to supraspinatus musculotendinous junction              CERVICAL ROM:    Active ROM AROM (deg) eval AROM (Deg) 10/02/2021  Flexion WNL WNL  Extension WNL, dizzy WNL  Right lateral flexion WNL WNL  Left lateral flexion 50%, dizzy WNL, minor dizziness  Right rotation WNL WNL  Left rotation 60% WNL   (Blank rows = not tested)   UPPER EXTREMITY ROM:   Active ROM Right eval Left eval Left 10/02/2021 Left 10/30/2021  Shoulder flexion WNL 120p! 180p! 176, minor p! (2/10)  Shoulder abduction WNL 85p! 170p! 182, minor pain (2/10)  Shoulder internal rotation WNL 20p! 78   Shoulder external rotation WNL 75 90    (Blank rows = not tested)   UPPER EXTREMITY MMT:   MMT Right eval Left eval Left 10/02/2021  Shoulder flexion 5/5 4/5p! 5/5  Shoulder abduction 5/5 4/5p! 5/5  Shoulder internal rotation 5/5 5/5   Shoulder external rotation 5/5 4+/5 5/5  Middle trapezius 3/5 3/5 4+/5  Lower trapezius 3/5 3/5 4+/5  Latissimus dorsi 3/5 3/5 4+/5  Elbow flexion 5/5 5/5   Elbow extension 5/5 5/5   Grip strength        (Blank rows = not tested)   SPECIAL TESTS:  Dix-Hallpike: (+) on Rt Hawkins-Kennedy: (+) on Lt Neer's: (+) on Lt Painful arc: (+) on Lt ERLS at 90 degrees abduction: (-)  09/16/2021: head thrust maneuver for VOR: (-) for nystagmus or delayed correction Lateral smooth pursuit: WNL Vertical smooth pursuit: mild delay with downward focus BIL  09/18/2021: Rt torsional upbeating nystagmus noted with Rt Dix-Hallpike: Positive for Rt posterior canal hyperactivity, Lt hypofunction       TODAY'S TREATMENT:    [facility] Adult PT Treatment:                                                DATE: 11/06/2021 Therapeutic Exercise: Seated alternating shoulder  diagonals with YTB 3x20 Prone rear delt fly with 3# dumbbell 3x10 on Lt Standing PNF D2 Lt shoulder flexion with YTB and back on wall 3x10 Seated low rows with 35# and chin tuck hold x10 Seated high rows with 35# and chin tuck hold x10 Seated lat pull-downs with 35# and chin tuck hold x10 Seated shoulder rolls 2x10 forward and backward Seated overhead shoulder IR lat stretch with side bend x67mn BIL Seated chest flies with slow return with 20# 3x10 Manual Therapy: N/A Neuromuscular re-ed: N/A Therapeutic Activity: N/A Modalities: N/A Self Care: N/A   [facility] Adult PT Treatment:  DATE: 10/30/2021 Therapeutic Exercise: Supine BIL shoulder flexion with 3# dumbbells 2x10 Standing BIL straight-arm horizontal abduction with 3# cables at Free Motion machine 3x10 Standing arm circles angels 2x5 Standing BIL shoulder ER at 0 degrees abduction with 3# cables at Free Motion machine 3x10 Standing BIL shoulder extension with 7# cables at Free Motion machine 3x10 Standing shoulder horizontal adduction stretch x37mn BIL Seated low rows with 30# and chin tuck hold x10 Seated high rows with 30# and chin tuck hold x10 Seated lat pull-downs with 30# and chin tuck hold x10 Seated shoulder rolls 2x10 forward and backward Manual Therapy: N/A Neuromuscular re-ed: N/A Therapeutic Activity: N/A Modalities: N/A Self Care: N/A   [facility] Adult PT Treatment:                                                DATE: 10/21/2021 Therapeutic Exercise: Standing Lt shoulder ER and scapular retraction with RTB 3x10 Bent-over Lt shoulder pendulums 3x30sec Seated low rows with 30# and chin tuck hold x10 Seated high rows with 30# and chin tuck hold x10 Seated lat pull-downs with 30# and chin tuck hold x10 Seated shoulder rolls 2x10 forward and backward Manual Therapy: N/A Neuromuscular re-ed: N/A Therapeutic Activity: Lt shoulder scaption functional lift with 5#  dumbbell from counter to chest-height shelf, to overhead shelf, and back again 2x10 Seated face-pulls with 10# plate 3x8 Modalities: N/A Self Care: N/A        PATIENT EDUCATION:  Education details: Pt educated on potential underlying pathophysiology behind her pain presentation, FOTO, POC, prognosis, and HEP Person educated: Patient Education method: Explanation, Demonstration, and Handouts Education comprehension: verbalized understanding and returned demonstration     HOME EXERCISE PROGRAM: Access Code: [email or web address]: [email or web address] Date: 09/09/2021 Prepared by: TVanessa [place]  Exercises - Shoulder External Rotation and Scapular Retraction with Resistance  - 1 x daily - 7 x weekly - 3 sets - 10 reps - 3-sec hold - Standing Shoulder Row with Anchored Resistance  - 1 x daily - 7 x weekly - 3 sets - 10 reps - 3-sec hold - Shoulder Scaption AAROM with Dowel  - 1 x daily - 7 x weekly - 2 sets - 10 reps - 5-sec hold - Seated Gaze Stabilization with Head Nod  - 1 x daily - 7 x weekly - 2 sets - 20 reps - Seated Gaze Stabilization with Head Rotation  - 1 x daily - 7 x weekly - 2 sets - 20 reps  Added 09/18/2021:  - Self-Epley Maneuver Right Ear  - 1 x daily - 7 x weekly - 1 sets - 1 reps   ASSESSMENT:   CLINICAL IMPRESSION: Pt responded well to new interventions today, demonstrating good form and no increase in pain with progressed exercises. She will continue to benefit from skilled PT to address her primary impairments and return to her prior level of function with less limitation.     OBJECTIVE IMPAIRMENTS Abnormal gait, decreased balance, decreased coordination, decreased mobility, difficulty walking, decreased ROM, decreased strength, dizziness, hypomobility, increased edema, impaired flexibility, impaired UE functional use, improper body mechanics, postural dysfunction, and pain.    ACTIVITY LIMITATIONS carrying, lifting, bending, sitting, standing,  squatting, sleeping, bathing, dressing, and reach over head   PARTICIPATION LIMITATIONS: meal prep, cleaning, laundry, driving, shopping, community activity, and yard work   PERSONAL FACTORS 3+ comorbidities: asthma,  COPD, morbid obesity  are also affecting patient's functional outcome.        GOALS: Goals reviewed with patient? Yes   SHORT TERM GOALS: Target date: 10/07/2021    Pt will report understanding and adherence to her HEP in order to promote independence in the management of her primary impairments. Baseline: HEP provided at eval 10/02/2021: Pt reports daily adherence to her HEP Goal status: ACHIEVED     LONG TERM GOALS: Target date: 11/03/2021   Pt will achieve a FOTO score of 67% in order to demonstrate improved functional ability as it relates to her primary impairments. Baseline: 57% 10/21/2021: 58% Goal status: INITIAL   2.  Pt will achieve Lt shoulder flexion AROM of 150 degrees or higher in order to reach into overhead cabinets with less limitation. Baseline: 120 degrees 10/02/2021: 180 degrees Goal status: ACHIEVED   3.  Pt will report ability to sleep through the night with no sleep disturbances due to pain. Baseline: Pt reports being woken every hour due to pain. 10/02/2021: Pt reports being woken once per night due to pain Goal status: ACHIEVED   4.  Pt will demonstrate WNL global cervical AROM without exacerbation of dizziness in order to get dressed without dizziness. Baseline: See AROM chart, dizziness with cervical extension and Lt lateral flexion 10/02/2021: See updated AROM chart Goal status: IN PROGRESS   5.  Pt will achieve global shoulder and parascapular strength of 4+/5 or greater in order to promote prophylaxis of future mechanical pain symptoms. Baseline: See MMT chart 10/02/2021: See updated MMT chart Goal status: ACHIEVED         PLAN: PT FREQUENCY: 2x/week   PT DURATION: 8 weeks   PLANNED INTERVENTIONS: Therapeutic exercises,  Therapeutic activity, Neuromuscular re-education, Balance training, Gait training, Patient/Family education, Joint mobilization, Stair training, Vestibular training, Canalith repositioning, Aquatic Therapy, Dry Needling, Electrical stimulation, Cryotherapy, Moist heat, Taping, Vasopneumatic device, Traction, Biofeedback, Ionotophoresis '4mg'$ /ml Dexamethasone, Manual therapy, and Re-evaluation   PLAN FOR NEXT SESSION: progress DNF/ parascapular/ RTC strengthening, gaze stabilization exercises/ Sheral Apley, PT, DPT 11/06/21 2:26 PM

## 2021-11-11 ENCOUNTER — Ambulatory Visit: Payer: Medicare Other

## 2021-11-11 DIAGNOSIS — M25512 Pain in left shoulder: Secondary | ICD-10-CM | POA: Diagnosis not present

## 2021-11-11 DIAGNOSIS — M6281 Muscle weakness (generalized): Secondary | ICD-10-CM

## 2021-11-11 DIAGNOSIS — G8929 Other chronic pain: Secondary | ICD-10-CM

## 2021-11-11 DIAGNOSIS — R42 Dizziness and giddiness: Secondary | ICD-10-CM

## 2021-11-11 DIAGNOSIS — R2681 Unsteadiness on feet: Secondary | ICD-10-CM

## 2021-11-11 NOTE — Therapy (Signed)
OUTPATIENT PHYSICAL THERAPY TREATMENT NOTE/ DISCHARGE SUMMARY   Patient Name: Maria Nelson MRN: [id number] DOB:[birth year], 66 y.o., female Today's Date: 11/11/2021  PCP: Madalyn Rob, MD REFERRING PROVIDER: Sid Falcon, MD  END OF SESSION:   PT End of Session - 11/11/21 1414     Visit Number 9    Number of Visits 17    Date for PT Re-Evaluation 11/11/21    Authorization Type UHC MCR    Authorization Time Period FOTO v6, v10, kx mod v15    Progress Note Due on Visit 10    PT Start Time 1400    PT Stop Time 1440    PT Time Calculation (min) 40 min    Activity Tolerance Patient tolerated treatment well    Behavior During Therapy [facility] for tasks assessed/performed                    Past Medical History:  Diagnosis Date   ANGIOEDEMA 12/16/2009   Asthma    Carpal tunnel syndrome 06/23/2011   Patient notes history of bilateral carpal tunnel syndrome.  Now has symptoms of right hand carpal tunnel.    COPD (chronic obstructive pulmonary disease) ([place])    secondary to tobacco use // No PFTs on file   COVID-19    ETOH abuse    Pt stopped in 3/08   Gastrointestinal hemorrhage    secondary to PUD on March 2008   OSA on CPAP    noncompliant with CPAP (2/2 it being depressing)   Peptic ulcer disease    +H. pylori (antigen)- Dr. Olevia Perches- treated   STRESS INCONTINENCE 01/24/2007   Tobacco abuse    quit in 2010   Urinary incontinence    Urticaria    Past Surgical History:  Procedure Laterality Date   BREAST EXCISIONAL BIOPSY Right 1994   MANDIBLE SURGERY     due to trauma   TUBAL LIGATION     Patient Active Problem List   Diagnosis Date Noted   Benign paroxysmal positional vertigo 09/03/2021   Colon cancer screening 07/14/2021   Healthcare maintenance 01/28/2021   Hyperlipidemia 11/22/2019   UTI (urinary tract infection) 11/22/2019   Adnexal pain 09/06/2018   Rash 05/11/2018   Paroxysmal atrial fibrillation ([place]) 08/24/2017   Dyspepsia  08/24/2017   Knee pain, left 08/16/2016   Back pain 06/23/2016   Urticaria 07/01/2015   Asthma with COPD 03/04/2015   Allergic rhinitis with nonallergic component 03/04/2015   COPD (chronic obstructive pulmonary disease) ([place]) 06/19/2014   Blood pressure elevated without history of HTN 03/05/2014   HNP (herniated nucleus pulposus), lumbar 04/16/2013   Tenosynovitis, de Quervain 11/15/2012   Morbid obesity with BMI of 45.0-49.9, adult ([place]) 09/08/2012   Carpal tunnel syndrome 06/23/2011   Shoulder pain 09/02/2010   Sleep apnea 09/02/2010   GERD 12/24/2009   Urticaria/angioedema 12/16/2009   STRESS INCONTINENCE 01/24/2007   History of peptic ulcer disease 07/26/2006    REFERRING DIAG: H81.10 (ICD-10-CM) - Benign paroxysmal positional vertigo, unspecified laterality  THERAPY DIAG:  Dizziness and giddiness  Muscle weakness (generalized)  Chronic left shoulder pain  Unsteadiness on feet  Rationale for Evaluation and Treatment Rehabilitation  PERTINENT HISTORY: asthma, COPD, morbid obesity  SUBJECTIVE: Pt reports no pain currently and adds that her dizziness has completely resolved. She reports being ready to be discharged at this time.  PAIN:  Are you having pain? Yes: NPRS scale: 0/10 Pain location: Lt shoulder Pain description: throbbing Aggravating factors: lifting Lt arm overhead,  laying on Lt arm, and lifting things Relieving factors: rest   OBJECTIVE: (objective measures completed at initial evaluation unless otherwise dated)   DIAGNOSTIC FINDINGS:  08/19/2021: MR Shoulder Left with or without Contrast: IMPRESSION: Mild-to-moderate supraspinatus and infraspinatus tendinopathy appears worse in the supraspinatus. There is a tiny interstitial tear of the far lateral supraspinatus but no full-thickness tendon tear or retraction.   Mild acromioclavicular osteoarthritis.   Small volume of subacromial/subdeltoid fluid compatible with bursitis.   PATIENT SURVEYS:   FOTO 57%, predicted 67% in 10 visits 10/21/2021: 58% 11/11/2021: 57%     COGNITION: Overall cognitive status: Within functional limits for tasks assessed     SENSATION: Not tested   POSTURE: rounded shoulders and forward head   PALPATION: TTP to supraspinatus musculotendinous junction              CERVICAL ROM:    Active ROM AROM (deg) eval AROM (Deg) 10/02/2021 AROM (Deg) 11/11/2021  Flexion WNL WNL WNL  Extension WNL, dizzy WNL WNL  Right lateral flexion WNL WNL WNL  Left lateral flexion 50%, dizzy WNL, minor dizziness WNL  Right rotation WNL WNL WNL  Left rotation 60% WNL WNL   (Blank rows = not tested)   UPPER EXTREMITY ROM:   Active ROM Right eval Left eval Left 10/02/2021 Left 10/30/2021 Left 11/11/2021  Shoulder flexion WNL 120p! 180p! 176, minor p! (2/10) 180, 2/10 p!  Shoulder abduction WNL 85p! 170p! 182, minor pain (2/10) 185, 2/10 p!  Shoulder internal rotation WNL 20p! 78    Shoulder external rotation WNL 75 90     (Blank rows = not tested)   UPPER EXTREMITY MMT:   MMT Right eval Left eval Left 10/02/2021 Right 11/11/2021 Left 11/11/2021  Shoulder flexion 5/5 4/5p! 5/5    Shoulder abduction 5/5 4/5p! 5/5    Shoulder internal rotation 5/5 5/5     Shoulder external rotation 5/5 4+/5 5/5    Middle trapezius 3/5 3/5 4+/5 4+/5 4+/5  Lower trapezius 3/5 3/5 4+/5 4+/5 4+/5  Latissimus dorsi 3/5 3/5 4+/5 4+/5 4+/5  Elbow flexion 5/5 5/5     Elbow extension 5/5 5/5     Grip strength          (Blank rows = not tested)   SPECIAL TESTS:  Dix-Hallpike: (+) on Rt Hawkins-Kennedy: (+) on Lt Neer's: (+) on Lt Painful arc: (+) on Lt ERLS at 90 degrees abduction: (-)  09/16/2021: head thrust maneuver for VOR: (-) for nystagmus or delayed correction Lateral smooth pursuit: WNL Vertical smooth pursuit: mild delay with downward focus BIL  09/18/2021: Rt torsional upbeating nystagmus noted with Rt Dix-Hallpike: Positive for Rt posterior canal hyperactivity,  Lt hypofunction       TODAY'S TREATMENT:   [facility] Adult PT Treatment:                                                DATE: 11/11/2021 Therapeutic Exercise: Standing PNF D2 shoulder flexion with YTB 2x10 on Lt Seated low rows with 35# and chin tuck hold x10 Seated high rows with 35# and chin tuck hold x10 Seated lat pull-downs with 35# and chin tuck hold x10 Seated shoulder rolls 2x10 forward and backward Manual Therapy: N/A Neuromuscular re-ed: N/A Therapeutic Activity: Re-assessment of FOTO with pt education Re-assessment of objective measures with pt education Modalities: N/A Self Care: N/A  [facility] Adult PT Treatment:                                                DATE: 11/06/2021 Therapeutic Exercise: Seated alternating shoulder diagonals with YTB 3x20 Prone rear delt fly with 3# dumbbell 3x10 on Lt Standing PNF D2 Lt shoulder flexion with YTB and back on wall 3x10 Seated low rows with 35# and chin tuck hold x10 Seated high rows with 35# and chin tuck hold x10 Seated lat pull-downs with 35# and chin tuck hold x10 Seated shoulder rolls 2x10 forward and backward Seated overhead shoulder IR lat stretch with side bend x45mn BIL Seated chest flies with slow return with 20# 3x10 Manual Therapy: N/A Neuromuscular re-ed: N/A Therapeutic Activity: N/A Modalities: N/A Self Care: N/A   [facility] Adult PT Treatment:                                                DATE: 10/30/2021 Therapeutic Exercise: Supine BIL shoulder flexion with 3# dumbbells 2x10 Standing BIL straight-arm horizontal abduction with 3# cables at Free Motion machine 3x10 Standing arm circles angels 2x5 Standing BIL shoulder ER at 0 degrees abduction with 3# cables at Free Motion machine 3x10 Standing BIL shoulder extension with 7# cables at Free Motion machine 3x10 Standing shoulder horizontal adduction stretch x160m BIL Seated low rows with 30# and chin tuck hold x10 Seated high rows with 30# and chin tuck hold  x10 Seated lat pull-downs with 30# and chin tuck hold x10 Seated shoulder rolls 2x10 forward and backward Manual Therapy: N/A Neuromuscular re-ed: N/A Therapeutic Activity: N/A Modalities: N/A Self Care: N/A      PATIENT EDUCATION:  Education details: Pt educated on potential underlying pathophysiology behind her pain presentation, FOTO, POC, prognosis, and HEP Person educated: Patient Education method: Explanation, Demonstration, and Handouts Education comprehension: verbalized understanding and returned demonstration     HOME EXERCISE PROGRAM: Access Code: [email or web address]: [email or web address] Date: 09/09/2021 Prepared by: TuVanessa [place] Exercises - Shoulder External Rotation and Scapular Retraction with Resistance  - 1 x daily - 7 x weekly - 3 sets - 10 reps - 3-sec hold - Standing Shoulder Row with Anchored Resistance  - 1 x daily - 7 x weekly - 3 sets - 10 reps - 3-sec hold - Shoulder Scaption AAROM with Dowel  - 1 x daily - 7 x weekly - 2 sets - 10 reps - 5-sec hold - Seated Gaze Stabilization with Head Nod  - 1 x daily - 7 x weekly - 2 sets - 20 reps - Seated Gaze Stabilization with Head Rotation  - 1 x daily - 7 x weekly - 2 sets - 20 reps  Added 09/18/2021:  - Self-Epley Maneuver Right Ear  - 1 x daily - 7 x weekly - 1 sets - 1 reps  Added 11/11/2021: - Shoulder PNF D2 with Resistance  - 1 x daily - 7 x weekly - 2 sets - 10 reps   ASSESSMENT:   CLINICAL IMPRESSION: Upon re-assessment of pt goals, the pt has met all of her functional rehab goals, making excellent progress in gross shoulder ROM, parascapular strength, cervical ROM, and dizziness. She reports complete resolution of dizziness. The pt's only  unmet goal was her FOTO goal. She is discharged at this time to continue to independently progress her updated HEP.     OBJECTIVE IMPAIRMENTS Abnormal gait, decreased balance, decreased coordination, decreased mobility, difficulty walking,  decreased ROM, decreased strength, dizziness, hypomobility, increased edema, impaired flexibility, impaired UE functional use, improper body mechanics, postural dysfunction, and pain.    ACTIVITY LIMITATIONS carrying, lifting, bending, sitting, standing, squatting, sleeping, bathing, dressing, and reach over head   PARTICIPATION LIMITATIONS: meal prep, cleaning, laundry, driving, shopping, community activity, and yard work   PERSONAL FACTORS 3+ comorbidities: asthma, COPD, morbid obesity  are also affecting patient's functional outcome.        GOALS: Goals reviewed with patient? Yes   SHORT TERM GOALS: Target date: 10/07/2021    Pt will report understanding and adherence to her HEP in order to promote independence in the management of her primary impairments. Baseline: HEP provided at eval 10/02/2021: Pt reports daily adherence to her HEP Goal status: ACHIEVED     LONG TERM GOALS: Target date: 11/03/2021   Pt will achieve a FOTO score of 67% in order to demonstrate improved functional ability as it relates to her primary impairments. Baseline: 57% 10/21/2021: 58% 11/11/2021: 57% Goal status: NOT MET   2.  Pt will achieve Lt shoulder flexion AROM of 150 degrees or higher in order to reach into overhead cabinets with less limitation. Baseline: 120 degrees 10/02/2021: 180 degrees Goal status: ACHIEVED   3.  Pt will report ability to sleep through the night with no sleep disturbances due to pain. Baseline: Pt reports being woken every hour due to pain. 10/02/2021: Pt reports being woken once per night due to pain Goal status: ACHIEVED   4.  Pt will demonstrate WNL global cervical AROM without exacerbation of dizziness in order to get dressed without dizziness. Baseline: See AROM chart, dizziness with cervical extension and Lt lateral flexion 10/02/2021: See updated AROM chart 11/11/2021: WNL in all planes without dizziness Goal status: ACHIEVED   5.  Pt will achieve global shoulder and  parascapular strength of 4+/5 or greater in order to promote prophylaxis of future mechanical pain symptoms. Baseline: See MMT chart 10/02/2021: See updated MMT chart Goal status: ACHIEVED         PLAN: PT FREQUENCY: 2x/week   PT DURATION: 8 weeks   PLANNED INTERVENTIONS: Therapeutic exercises, Therapeutic activity, Neuromuscular re-education, Balance training, Gait training, Patient/Family education, Joint mobilization, Stair training, Vestibular training, Canalith repositioning, Aquatic Therapy, Dry Needling, Electrical stimulation, Cryotherapy, Moist heat, Taping, Vasopneumatic device, Traction, Biofeedback, Ionotophoresis 63m/ml Dexamethasone, Manual therapy, and Re-evaluation   PLAN FOR NEXT SESSION: Pt is discharged from PT at this time.    PHYSICAL THERAPY DISCHARGE SUMMARY  Visits from Start of Care: 9  Current functional level related to goals / functional outcomes: Pt has met all of her functional rehab goals, making excellent improvement in sxs of dizziness, shoulder ROM, and cervical ROM   Remaining deficits: Minor pain with end-range shoulder elevation   Education / Equipment: HEP   Patient agrees to discharge. Patient goals were met. Patient is being discharged due to being pleased with the current functional level.    YVanessa [place] PT, DPT 11/11/21 2:42 PM

## 2021-11-13 ENCOUNTER — Ambulatory Visit: Payer: Medicare Other

## 2021-11-18 ENCOUNTER — Telehealth: Payer: Self-pay | Admitting: Internal Medicine

## 2021-11-18 NOTE — Telephone Encounter (Signed)
Patient called and needs a refill on zyrtec 10 mg. Sent to [facility] on [address]. [phone]

## 2021-11-19 NOTE — Telephone Encounter (Signed)
I called the patient and informed her that the zyrtec is not covered by her insurance. She verbalized understanding and plans to but it over the counter.

## 2021-11-26 ENCOUNTER — Inpatient Hospital Stay (HOSPITAL_COMMUNITY)
Admission: EM | Admit: 2021-11-26 | Discharge: 2021-11-29 | DRG: 190 | Disposition: A | Discharge status: Home / Self Care | Payer: Medicare Other | Attending: Internal Medicine | Admitting: Internal Medicine

## 2021-11-26 ENCOUNTER — Emergency Department (HOSPITAL_COMMUNITY): Payer: Medicare Other

## 2021-11-26 ENCOUNTER — Encounter (HOSPITAL_COMMUNITY): Payer: Self-pay | Admitting: Emergency Medicine

## 2021-11-26 ENCOUNTER — Other Ambulatory Visit: Payer: Self-pay

## 2021-11-26 DIAGNOSIS — J441 Chronic obstructive pulmonary disease with (acute) exacerbation: Secondary | ICD-10-CM | POA: Diagnosis not present

## 2021-11-26 DIAGNOSIS — J440 Chronic obstructive pulmonary disease with acute lower respiratory infection: Secondary | ICD-10-CM | POA: Diagnosis not present

## 2021-11-26 DIAGNOSIS — J9601 Acute respiratory failure with hypoxia: Secondary | ICD-10-CM | POA: Diagnosis not present

## 2021-11-26 DIAGNOSIS — Z7951 Long term (current) use of inhaled steroids: Secondary | ICD-10-CM | POA: Diagnosis not present

## 2021-11-26 DIAGNOSIS — G4733 Obstructive sleep apnea (adult) (pediatric): Secondary | ICD-10-CM | POA: Diagnosis not present

## 2021-11-26 DIAGNOSIS — Z79899 Other long term (current) drug therapy: Secondary | ICD-10-CM

## 2021-11-26 DIAGNOSIS — D72828 Other elevated white blood cell count: Secondary | ICD-10-CM | POA: Diagnosis not present

## 2021-11-26 DIAGNOSIS — J449 Chronic obstructive pulmonary disease, unspecified: Secondary | ICD-10-CM | POA: Diagnosis present

## 2021-11-26 DIAGNOSIS — K219 Gastro-esophageal reflux disease without esophagitis: Secondary | ICD-10-CM | POA: Diagnosis present

## 2021-11-26 DIAGNOSIS — Z91199 Patient's noncompliance with other medical treatment and regimen due to unspecified reason: Secondary | ICD-10-CM | POA: Diagnosis not present

## 2021-11-26 DIAGNOSIS — T783XXD Angioneurotic edema, subsequent encounter: Secondary | ICD-10-CM | POA: Diagnosis not present

## 2021-11-26 DIAGNOSIS — A419 Sepsis, unspecified organism: Secondary | ICD-10-CM | POA: Diagnosis not present

## 2021-11-26 DIAGNOSIS — R911 Solitary pulmonary nodule: Secondary | ICD-10-CM | POA: Diagnosis present

## 2021-11-26 DIAGNOSIS — Z20822 Contact with and (suspected) exposure to covid-19: Secondary | ICD-10-CM | POA: Diagnosis present

## 2021-11-26 DIAGNOSIS — Z87891 Personal history of nicotine dependence: Secondary | ICD-10-CM

## 2021-11-26 DIAGNOSIS — R918 Other nonspecific abnormal finding of lung field: Secondary | ICD-10-CM | POA: Diagnosis not present

## 2021-11-26 DIAGNOSIS — R0602 Shortness of breath: Secondary | ICD-10-CM | POA: Diagnosis not present

## 2021-11-26 DIAGNOSIS — N644 Mastodynia: Secondary | ICD-10-CM | POA: Diagnosis not present

## 2021-11-26 DIAGNOSIS — I480 Paroxysmal atrial fibrillation: Secondary | ICD-10-CM | POA: Diagnosis present

## 2021-11-26 DIAGNOSIS — I10 Essential (primary) hypertension: Secondary | ICD-10-CM | POA: Diagnosis present

## 2021-11-26 DIAGNOSIS — R079 Chest pain, unspecified: Secondary | ICD-10-CM | POA: Diagnosis not present

## 2021-11-26 DIAGNOSIS — G4730 Sleep apnea, unspecified: Secondary | ICD-10-CM | POA: Diagnosis present

## 2021-11-26 DIAGNOSIS — T783XXA Angioneurotic edema, initial encounter: Secondary | ICD-10-CM | POA: Diagnosis not present

## 2021-11-26 DIAGNOSIS — J189 Pneumonia, unspecified organism: Secondary | ICD-10-CM | POA: Diagnosis present

## 2021-11-26 DIAGNOSIS — E785 Hyperlipidemia, unspecified: Secondary | ICD-10-CM | POA: Diagnosis present

## 2021-11-26 DIAGNOSIS — R718 Other abnormality of red blood cells: Secondary | ICD-10-CM | POA: Diagnosis not present

## 2021-11-26 DIAGNOSIS — L508 Other urticaria: Secondary | ICD-10-CM | POA: Diagnosis present

## 2021-11-26 DIAGNOSIS — Z8711 Personal history of peptic ulcer disease: Secondary | ICD-10-CM

## 2021-11-26 DIAGNOSIS — J4489 Other specified chronic obstructive pulmonary disease: Secondary | ICD-10-CM | POA: Diagnosis present

## 2021-11-26 LAB — BASIC METABOLIC PANEL
Anion gap: 7 (ref 5–15)
BUN: 12 mg/dL (ref 8–23)
CO2: 25 mmol/L (ref 22–32)
Calcium: 9.3 mg/dL (ref 8.9–10.3)
Chloride: 105 mmol/L (ref 98–111)
Creatinine, Ser: 1.28 mg/dL — ABNORMAL HIGH (ref 0.44–1.00)
GFR, Estimated: 46 mL/min — ABNORMAL LOW (ref >60)
Glucose, Bld: 104 mg/dL — ABNORMAL HIGH (ref 70–99)
Potassium: 4.1 mmol/L (ref 3.5–5.1)
Sodium: 137 mmol/L (ref 135–145)

## 2021-11-26 LAB — TROPONIN I (HIGH SENSITIVITY)
Troponin I (High Sensitivity): 6 ng/L (ref <18)
Troponin I (High Sensitivity): 6 ng/L (ref <18)

## 2021-11-26 LAB — CBC
HCT: 42.6 % (ref 36.0–46.0)
Hemoglobin: 13.7 g/dL (ref 12.0–15.0)
MCH: 25 pg — ABNORMAL LOW (ref 26.0–34.0)
MCHC: 32.2 g/dL (ref 30.0–36.0)
MCV: 77.9 fL — ABNORMAL LOW (ref 80.0–100.0)
Platelets: 249 10*3/uL (ref 150–400)
RBC: 5.47 MIL/uL — ABNORMAL HIGH (ref 3.87–5.11)
RDW: 15 % (ref 11.5–15.5)
WBC: 16.9 10*3/uL — ABNORMAL HIGH (ref 4.0–10.5)
nRBC: 0 % (ref 0.0–0.2)

## 2021-11-26 LAB — D-DIMER, QUANTITATIVE: D-Dimer, Quant: 0.44 ug/mL-FEU (ref 0.00–0.50)

## 2021-11-26 MED ORDER — IPRATROPIUM-ALBUTEROL 0.5-2.5 (3) MG/3ML IN SOLN
3.0000 mL | Freq: Once | RESPIRATORY_TRACT | Status: AC
  Administered 2021-11-26: 3 mL via RESPIRATORY_TRACT
  Filled 2021-11-26: qty 3

## 2021-11-26 MED ORDER — SODIUM CHLORIDE 0.9 % IV BOLUS
500.0000 mL | Freq: Once | INTRAVENOUS | Status: AC
  Administered 2021-11-26: 500 mL via INTRAVENOUS

## 2021-11-26 NOTE — ED Provider Triage Note (Signed)
Emergency Medicine Provider Triage Evaluation Note  Maria Nelson , a 66 y.o. female  was evaluated in triage.  Pt complains of chest pain, bilateral breast pain, and SOB starting abruptly this afternoon. States the middle of her chest as well as both of her breasts feel very sensitive to touch and she describes her pain as burning. She tried using a heating pad without relief.   Review of Systems  Positive: Cp, breast pain, SOB Negative: Fever, cough, diaphoresis, vomiting  Physical Exam  BP 128/82 (BP Location: Left Arm)   Pulse 98   Temp 99.1 F (37.3 C) (Oral)   Resp (!) 22   Ht 5' (1.524 m)   Wt 130 kg   LMP 04/19/2004 (Approximate)   SpO2 98%   BMI 55.97 kg/m  Gen:   Awake, no distress   Resp:  Normal effort  MSK:   Moves extremities without difficulty  Other:  No rashes or wounds seen to the breasts  Medical Decision Making  Medically screening exam initiated at 8:52 PM.  Appropriate orders placed.  Maria Nelson was informed that the remainder of the evaluation will be completed by another provider, this initial triage assessment does not replace that evaluation, and the importance of remaining in the ED until their evaluation is complete.     Maria Nelson, Maria Nelson 11/26/21 2053

## 2021-11-26 NOTE — ED Provider Notes (Signed)
[facility] EMERGENCY DEPARTMENT Provider Note   CSN: [id number] Arrival date & time: 11/26/21  2012     History  Chief Complaint  Patient presents with   Chest Pain   Shortness of Breath    Maria Nelson is a 66 y.o. female with a hx of COPD, asthma, PUD, paroxysmal afib, hypertension, and hyperlipidemia who presents to the ED with complaints of chest pain since 1400 today. Patient reports burning pain to the central chest with achiness to the bilateral breast and dyspnea that began this afternoon while @ rest. Worse with activity & deep breathing, mildly alleviated by applying a heating pad but then intensified. Having associated fatigue, she thinks she is wheezing. Denies fever, cough,nausea, vomiting, lower leg pain/swelling, hemoptysis, recent surgery/trauma, recent long travel, hormone use, personal hx of cancer, or hx of DVT/PE.    HPI     Home Medications Prior to Admission medications   Medication Sig Start Date End Date Taking? Authorizing Provider  albuterol (PROVENTIL) (2.5 MG/3ML) 0.083% nebulizer solution Use 1 vial in nebulizer up to 3 times a day when albuterol inhaler not working. If no better or getting worse, call 911 or physician line 02/27/18   Bobbitt, [place] Muta, MD  albuterol (VENTOLIN HFA) 108 (90 Base) MCG/ACT inhaler Inhale 2 puffs into the lungs every 6 (six) hours as needed for wheezing or shortness of breath. 11/02/21   Clemon Chambers, MD  Budeson-Glycopyrrol-Formoterol (BREZTRI AEROSPHERE) 160-9-4.8 MCG/ACT AERO Inhale 2 puffs into the lungs 2 (two) times daily. 11/02/21   Clemon Chambers, MD  cetirizine (ZYRTEC) 10 MG tablet Take 1 tablet (10 mg total) by mouth daily. Patient not taking: Reported on 11/02/2021 11/27/20   Althea Charon, FNP  EPINEPHrine (EPIPEN 2-PAK) 0.3 mg/0.3 mL IJ SOAJ injection Inject 0.3 mg into the muscle as needed (for allergic reaction). 11/02/21   Clemon Chambers, MD  Fluticasone Furoate (ARNUITY  ELLIPTA) 100 MCG/ACT AEPB Inhale 1 puff into the lungs daily. 06/19/21   Kennith Gain, MD  levocetirizine (XYZAL) 5 MG tablet Take 1 tablet (5 mg total) by mouth every evening. 09/16/21   Althea Charon, FNP  pantoprazole (PROTONIX) 40 MG tablet Take 1 tablet (40 mg total) by mouth daily. 09/18/21   Madalyn Rob, MD      Allergies    Bee venom, Aspirin, and Other    Review of Systems   Review of Systems  Constitutional:  Positive for fatigue. Negative for fever.  Respiratory:  Positive for shortness of breath. Negative for cough.   Cardiovascular:  Positive for chest pain. Negative for leg swelling.  Gastrointestinal:  Negative for abdominal pain, nausea and vomiting.  Neurological:  Positive for weakness.  All other systems reviewed and are negative.   Physical Exam Updated Vital Signs BP 128/82 (BP Location: Left Arm)   Pulse 98   Temp 99.1 F (37.3 C) (Oral)   Resp (!) 22   Ht 5' (1.524 m)   Wt 130 kg   LMP 04/19/2004 (Approximate)   SpO2 98%   BMI 55.97 kg/m  Physical Exam Vitals and nursing note reviewed.  Constitutional:      General: She is not in acute distress.    Appearance: She is well-developed. She is not toxic-appearing.  HENT:     Head: Normocephalic and atraumatic.  Eyes:     General:        Right eye: No discharge.        Left eye: No discharge.  Conjunctiva/sclera: Conjunctivae normal.  Cardiovascular:     Rate and Rhythm: Normal rate and regular rhythm.  Pulmonary:     Effort: No respiratory distress.     Breath sounds: No wheezing, rhonchi or rales.     Comments: Somewhat poor air movement.  Chest:     Chest wall: Tenderness (central anterior chest wall) present.  Abdominal:     General: There is no distension.     Palpations: Abdomen is soft.     Tenderness: There is no abdominal tenderness.  Musculoskeletal:     Cervical back: Neck supple.     Comments: No significant pitting edema. No calf tenderness.   Skin:    General:  Skin is warm and dry.  Neurological:     Mental Status: She is alert.     Comments: Clear speech.   Psychiatric:        Behavior: Behavior normal.     ED Results / Procedures / Treatments   Labs (all labs ordered are listed, but only abnormal results are displayed) Labs Reviewed  BASIC METABOLIC PANEL - Abnormal; Notable for the following components:      Result Value   Glucose, Bld 104 (*)    Creatinine, Ser 1.28 (*)    GFR, Estimated 46 (*)    All other components within normal limits  CBC - Abnormal; Notable for the following components:   WBC 16.9 (*)    RBC 5.47 (*)    MCV 77.9 (*)    MCH 25.0 (*)    All other components within normal limits  RESP PANEL BY RT-PCR (FLU A&B, COVID) [id number]  TROPONIN I (HIGH SENSITIVITY)  TROPONIN I (HIGH SENSITIVITY)    EKG EKG Interpretation  Date/Time:  Thursday November 26 2021 22:28:50 EDT Ventricular Rate:  79 PR Interval:  153 QRS Duration: 85 QT Interval:  359 QTC Calculation: 412 R Axis:   -2 Text Interpretation: Sinus rhythm Low voltage, precordial leads Abnormal R-wave progression, early transition similar to prior no stemi Confirmed by Wynona Dove ([id number]) on 11/26/2021 11:04:59 PM  Radiology DG Chest 2 View  Result Date: 11/26/2021 CLINICAL DATA:  Chest pain and shortness of breath. EXAM: CHEST - 2 VIEW COMPARISON:  05/19/2020, included portions from coronary CT 09/21/2018 FINDINGS: Chronic but increased peribronchial thickening. Stable lung volumes. Normal heart size with unchanged mediastinal contours. Minor bibasilar atelectasis or scarring. No pleural effusion or pneumothorax. No acute osseous abnormalities are seen. IMPRESSION: Chronic but increased peribronchial thickening suggesting acute bronchitis or asthma exacerbation. Electronically Signed   By: Keith Rake M.D.   On: 11/26/2021 21:08    Procedures Procedures    Medications Ordered in ED Medications  ipratropium-albuterol (DUONEB) 0.5-2.5 (3) MG/3ML  nebulizer solution 3 mL (has no administration in time range)  sodium chloride 0.9 % bolus 500 mL (has no administration in time range)    ED Course/ Medical Decision Making/ A&P                           Medical Decision Making Amount and/or Complexity of Data Reviewed Labs: ordered. Radiology: ordered.  Risk OTC drugs. Prescription drug management.  Patient presents to the emergency department with chest pain and shortness of breath on exertion. Patient nontoxic appearing, in no apparent distress, vitals without significant abnormality. Chest wall TTP. Poor air movement- will give neb & re-assess.   DDX including but not limited to: ACS, pulmonary embolism, COPD/asthma exacerbation, dissection, pneumothorax, pneumonia, arrhythmia, severe  anemia, MSK, GERD, anxiety,.   Additional history obtained:  Chart & nursing note reviewed.   EKG: Sinus rhythm Low voltage, precordial leads Abnormal R-wave progression, early transition similar to prior no stemi   Lab Tests:  I reviewed & interpreted labs including:  CBC: leukocytosis BMP: creatinine mildly increased Troponin: no significant elevation Ddimer: WNL UA: No UTI Covid/flu: Negative.   Imaging Studies ordered:  I viewed the following imaging, agree with radiologist impression:  CXR: Chronic but increased peribronchial thickening suggesting acute bronchitis or asthma exacerbation  ED Course:  I ordered medications including duoneb, fentanyl, and small fluid bolus following initial assessment. Patient desaturating at times- placed on 2L [place], progressive tachypnea in combination with her leukocytosis checked rectal temp 101.4. Attending initiated code sepsis, started abx, steroids given for possible asthma/COPD exacerbation. Patient unable to transition to RA without desaturation- admit to internal medicine.   Portions of this note were generated with Lobbyist. Dictation errors may occur despite best attempts at  proofreading.   Final Clinical Impression(s) / ED Diagnoses Final diagnoses:  Acute respiratory failure with hypoxia ([place])  COPD exacerbation [facility])    Rx / DC Orders ED Discharge Orders     None         Amaryllis Dyke, PA-C 11/27/21 [place], [place], DO 11/27/21 [phone]

## 2021-11-26 NOTE — ED Triage Notes (Signed)
Patient reports chest tightness/burning with SOB onset today , no emesis or diaphoresis , denies cough or fever . Her cardiologist is Dr. Radford Pax.

## 2021-11-27 ENCOUNTER — Emergency Department (HOSPITAL_COMMUNITY): Payer: Medicare Other

## 2021-11-27 ENCOUNTER — Institutional Professional Consult (permissible substitution): Payer: Medicare Other | Admitting: Internal Medicine

## 2021-11-27 DIAGNOSIS — T783XXD Angioneurotic edema, subsequent encounter: Secondary | ICD-10-CM | POA: Diagnosis not present

## 2021-11-27 DIAGNOSIS — G4733 Obstructive sleep apnea (adult) (pediatric): Secondary | ICD-10-CM | POA: Diagnosis not present

## 2021-11-27 DIAGNOSIS — R079 Chest pain, unspecified: Secondary | ICD-10-CM | POA: Diagnosis not present

## 2021-11-27 DIAGNOSIS — J449 Chronic obstructive pulmonary disease, unspecified: Secondary | ICD-10-CM

## 2021-11-27 DIAGNOSIS — Z87891 Personal history of nicotine dependence: Secondary | ICD-10-CM

## 2021-11-27 DIAGNOSIS — R918 Other nonspecific abnormal finding of lung field: Secondary | ICD-10-CM | POA: Diagnosis not present

## 2021-11-27 DIAGNOSIS — R911 Solitary pulmonary nodule: Secondary | ICD-10-CM

## 2021-11-27 DIAGNOSIS — R0602 Shortness of breath: Secondary | ICD-10-CM | POA: Diagnosis not present

## 2021-11-27 DIAGNOSIS — K219 Gastro-esophageal reflux disease without esophagitis: Secondary | ICD-10-CM | POA: Diagnosis not present

## 2021-11-27 DIAGNOSIS — N644 Mastodynia: Secondary | ICD-10-CM

## 2021-11-27 DIAGNOSIS — A419 Sepsis, unspecified organism: Secondary | ICD-10-CM | POA: Diagnosis not present

## 2021-11-27 DIAGNOSIS — J9601 Acute respiratory failure with hypoxia: Secondary | ICD-10-CM | POA: Diagnosis not present

## 2021-11-27 LAB — URINALYSIS, ROUTINE W REFLEX MICROSCOPIC
Bilirubin Urine: NEGATIVE
Glucose, UA: NEGATIVE mg/dL
Hgb urine dipstick: NEGATIVE
Ketones, ur: NEGATIVE mg/dL
Leukocytes,Ua: NEGATIVE
Nitrite: NEGATIVE
Protein, ur: NEGATIVE mg/dL
Specific Gravity, Urine: 1.014 (ref 1.005–1.030)
pH: 8 (ref 5.0–8.0)

## 2021-11-27 LAB — RESPIRATORY PANEL BY PCR

## 2021-11-27 LAB — PROCALCITONIN: Procalcitonin: 0.51 ng/mL

## 2021-11-27 LAB — RESP PANEL BY RT-PCR (FLU A&B, COVID) ARPGX2
Influenza A by PCR: NEGATIVE
Influenza B by PCR: NEGATIVE
SARS Coronavirus 2 by RT PCR: NEGATIVE

## 2021-11-27 LAB — LACTIC ACID, PLASMA: Lactic Acid, Venous: 1 mmol/L (ref 0.5–1.9)

## 2021-11-27 MED ORDER — LORATADINE 10 MG PO TABS
10.0000 mg | ORAL_TABLET | Freq: Every day | ORAL | Status: DC
  Administered 2021-11-27 – 2021-11-29 (×3): 10 mg via ORAL
  Filled 2021-11-27 (×3): qty 1

## 2021-11-27 MED ORDER — SODIUM CHLORIDE 0.9 % IV SOLN
500.0000 mg | INTRAVENOUS | Status: AC
  Administered 2021-11-27 – 2021-11-29 (×3): 500 mg via INTRAVENOUS
  Filled 2021-11-27 (×3): qty 5

## 2021-11-27 MED ORDER — ACETAMINOPHEN 325 MG PO TABS
650.0000 mg | ORAL_TABLET | Freq: Four times a day (QID) | ORAL | Status: DC | PRN
  Administered 2021-11-27: 650 mg via ORAL
  Filled 2021-11-27: qty 2

## 2021-11-27 MED ORDER — METHYLPREDNISOLONE SODIUM SUCC 125 MG IJ SOLR
62.5000 mg | Freq: Once | INTRAMUSCULAR | Status: AC
  Administered 2021-11-27: 62.5 mg via INTRAVENOUS
  Filled 2021-11-27: qty 2

## 2021-11-27 MED ORDER — HYDROXYZINE HCL 25 MG PO TABS: 25.0000 mg | ORAL_TABLET | Freq: Three times a day (TID) | ORAL | Status: DC | PRN

## 2021-11-27 MED ORDER — FENTANYL CITRATE PF 50 MCG/ML IJ SOSY
50.0000 ug | PREFILLED_SYRINGE | Freq: Once | INTRAMUSCULAR | Status: AC
Start: 2021-11-27 — End: 2021-11-27
  Administered 2021-11-27: 50 ug via INTRAVENOUS
  Filled 2021-11-27: qty 1

## 2021-11-27 MED ORDER — PANTOPRAZOLE SODIUM 40 MG PO TBEC
40.0000 mg | DELAYED_RELEASE_TABLET | Freq: Every day | ORAL | Status: DC
  Administered 2021-11-27 – 2021-11-29 (×3): 40 mg via ORAL
  Filled 2021-11-27 (×3): qty 1

## 2021-11-27 MED ORDER — SODIUM CHLORIDE 0.9 % IV SOLN
2.0000 g | INTRAVENOUS | Status: DC
  Administered 2021-11-27 – 2021-11-29 (×3): 2 g via INTRAVENOUS
  Filled 2021-11-27 (×3): qty 20

## 2021-11-27 MED ORDER — ACETAMINOPHEN 325 MG PO TABS: 650.0000 mg | ORAL_TABLET | Freq: Once | ORAL | Status: DC

## 2021-11-27 MED ORDER — UMECLIDINIUM BROMIDE 62.5 MCG/ACT IN AEPB
1.0000 | INHALATION_SPRAY | Freq: Every day | RESPIRATORY_TRACT | Status: DC
Start: 2021-11-27 — End: 2021-11-28
  Filled 2021-11-27: qty 7

## 2021-11-27 MED ORDER — IPRATROPIUM-ALBUTEROL 0.5-2.5 (3) MG/3ML IN SOLN
3.0000 mL | Freq: Four times a day (QID) | RESPIRATORY_TRACT | Status: DC
  Administered 2021-11-28: 3 mL via RESPIRATORY_TRACT
  Filled 2021-11-27: qty 3

## 2021-11-27 MED ORDER — IOHEXOL 300 MG/ML  SOLN
100.0000 mL | Freq: Once | INTRAMUSCULAR | Status: AC | PRN
  Administered 2021-11-27: 75 mL via INTRAVENOUS

## 2021-11-27 MED ORDER — PREDNISONE 20 MG PO TABS
40.0000 mg | ORAL_TABLET | Freq: Every day | ORAL | Status: DC
Start: 2021-11-28 — End: 2021-11-29
  Administered 2021-11-28 – 2021-11-29 (×2): 40 mg via ORAL
  Filled 2021-11-27 (×2): qty 2

## 2021-11-27 MED ORDER — MOMETASONE FURO-FORMOTEROL FUM 200-5 MCG/ACT IN AERO
2.0000 | INHALATION_SPRAY | Freq: Two times a day (BID) | RESPIRATORY_TRACT | Status: DC
  Administered 2021-11-27 – 2021-11-29 (×4): 2 via RESPIRATORY_TRACT
  Filled 2021-11-27: qty 8.8

## 2021-11-27 MED ORDER — LACTATED RINGERS IV SOLN: INTRAVENOUS | Status: AC

## 2021-11-27 MED ORDER — IPRATROPIUM-ALBUTEROL 0.5-2.5 (3) MG/3ML IN SOLN
3.0000 mL | RESPIRATORY_TRACT | Status: DC
  Administered 2021-11-27 (×4): 3 mL via RESPIRATORY_TRACT
  Filled 2021-11-27 (×4): qty 3

## 2021-11-27 MED ORDER — BUDESON-GLYCOPYRROL-FORMOTEROL 160-9-4.8 MCG/ACT IN AERO: 2.0000 | INHALATION_SPRAY | Freq: Two times a day (BID) | RESPIRATORY_TRACT | Status: DC

## 2021-11-27 MED ORDER — ENOXAPARIN SODIUM 40 MG/0.4ML IJ SOSY
40.0000 mg | PREFILLED_SYRINGE | Freq: Every day | INTRAMUSCULAR | Status: DC
Start: 2021-11-27 — End: 2021-11-29
  Administered 2021-11-27 – 2021-11-29 (×3): 40 mg via SUBCUTANEOUS
  Filled 2021-11-27 (×3): qty 0.4

## 2021-11-27 NOTE — Progress Notes (Signed)
Sepsis tracking by elink 

## 2021-11-27 NOTE — ED Provider Notes (Signed)
66 year old female history of COPD, asthma, paroxysmal A-fib, hypertension hyperlipidemia to the ED with chest pain since around 2 PM yesterday.  Associate with dyspnea.  Dyspnea chest pain worse with exertion.  Also having fatigue, no fever.  Productive cough with white sputum.  No nausea or vomiting.  While in the ED found to be tachypneic, hypoxic on room air, febrile.  Diffuse wheezing.  No home oxygen use but does have CPAP with variable compliance.  Placed on 2 L nasal cannula with improvement from 85% pulse ox to 95% pulse ox.  Rester status improved on nasal cannula, seen improvement with nebulized breathing treatments. Cr mildly worsened from baseline. Leukocytosis 16.9, febrile 101.6, hypoxic, tachypneic; patient with pulmonary infection.  Code sepsis paged.  Start Rocephin azithromycin.  BP stable.  Chest x-ray with concern for possible acute bronchitis versus asthma.  Obtain CT chest which does show some atelectasis, no focal infiltrate.  Concern for possible COPD versus asthma exacerbation.  Attempted trial patient off nasal cannula and she will desaturate.  Tachypneic.  Dyspneic.  Recommend admission for acute respite failure with hypoxia and new oxygen requirement, COPD versus asthma exacerbation, atypical pneumonia?  Patient is agreeable. D/w IMTS who accepts pt for admission. She is HDS.   CRITICAL CARE Performed by: Jeanell Sparrow   Total critical care time: 31 minutes  Critical care time was exclusive of separately billable procedures and treating other patients.  Critical care was necessary to treat or prevent imminent or life-threatening deterioration.  Critical care was time spent personally by me on the following activities: development of treatment plan with patient and/or surrogate as well as nursing, discussions with consultants, evaluation of patient's response to treatment, examination of patient, obtaining history from patient or surrogate, ordering and performing treatments  and interventions, ordering and review of laboratory studies, ordering and review of radiographic studies, pulse oximetry and re-evaluation of patient's condition.    Jeanell Sparrow, DO 11/27/21 [phone]

## 2021-11-27 NOTE — H&P (Addendum)
Date: 11/27/2021               Patient Name:  Maria Nelson MRN: [id number]  DOB: [birth year] Age / Sex: 66 y.o., female   PCP: Serita Butcher, MD         Medical Service: Internal Medicine Teaching Service         Attending Physician: Dr. Sid Falcon, MD    First Contact: Dr. Vena Rua, DO Pager: [phone]  Second Contact: Dr. Sanjuana Letters Pager: [phone]       After Hours (After 5p/  First Contact Pager: [phone]  weekends / holidays): Second Contact Pager: [phone]   Chief Complaint: dyspnea  History of Present Illness:   Salli Bodin is a 66 year old female with a PMH COPD, chronic urticaria, GERD, paroxsymal afib who presents to [facility] for dyspnea and bilateral breast pain. She was at her friends house around 2pm when she noticed chills and bilateral breast pain. She went home tried to use a heating pad and fell asleep. Later wok up feeling worse. Endorses malaise, worsened dyspnea, chest tightness and generalized body aches. Endorsed wheezing to ED provider but denied any wheezing during this interview. She uses breztri and albuterol as needed. Was using albuterol more frequently when weather was hot, but has been staying indoors and not needed albuterol in the last week. She follow with allergy and immunology and received a sample of Fasenra on 7/17.  Since being in ED feels that breathing has improved to baseline. However she continues to endorse bilateral breast  burning breast pain. Left beast is worse than right with pain radiating out from acerolas.Denies nipple drainage or rash. She is diagnosed with OSA but does not use CPAP due to inability to tolerate mask. She denies fever, palpitations, cough, rhinorrhea, rash, tongue swelling, n/v/d, abdominal pain, or urinary symptoms. Denies any sick contacts.  Meds:  Current Meds  Medication Sig   albuterol (PROVENTIL) (2.5 MG/3ML) 0.083% nebulizer solution Use 1 vial in nebulizer up to 3 times a  day when albuterol inhaler not working. If no better or getting worse, call 911 or physician line   albuterol (VENTOLIN HFA) 108 (90 Base) MCG/ACT inhaler Inhale 2 puffs into the lungs every 6 (six) hours as needed for wheezing or shortness of breath.   Budeson-Glycopyrrol-Formoterol (BREZTRI AEROSPHERE) 160-9-4.8 MCG/ACT AERO Inhale 2 puffs into the lungs 2 (two) times daily.   cetirizine (ZYRTEC) 10 MG tablet Take 1 tablet (10 mg total) by mouth daily.   EPINEPHrine (EPIPEN 2-PAK) 0.3 mg/0.3 mL IJ SOAJ injection Inject 0.3 mg into the muscle as needed (for allergic reaction).   FASENRA 30 MG/ML SOSY Inject 30 mg into the skin every 30 (thirty) days.   pantoprazole (PROTONIX) 40 MG tablet Take 1 tablet (40 mg total) by mouth daily.     Allergies: Allergies as of 11/26/2021 - Review Complete 11/26/2021  Allergen Reaction Noted   Bee venom Anaphylaxis 06/30/2012   Aspirin Nausea Only    Other Rash 05/10/2018   Past Medical History:  Diagnosis Date   ANGIOEDEMA 12/16/2009   Asthma    Carpal tunnel syndrome 06/23/2011   Patient notes history of bilateral carpal tunnel syndrome.  Now has symptoms of right hand carpal tunnel.    COPD (chronic obstructive pulmonary disease) ([place])    secondary to tobacco use // No PFTs on file   COVID-19    ETOH abuse    Pt stopped in 3/08   Gastrointestinal hemorrhage  secondary to PUD on March 2008   OSA on CPAP    noncompliant with CPAP (2/2 it being depressing)   Peptic ulcer disease    +H. pylori (antigen)- Dr. Olevia Perches- treated   STRESS INCONTINENCE 01/24/2007   Tobacco abuse    quit in 2010   Urinary incontinence    Urticaria    Family History: No known family history  Social History:  Lives in [place], sister and 3 sons also live nearby. Ambulates indpendently, independent with ADLs and iADLs Tobacco  8 year ago 1/2 ppd for 30 years. 1-2 glasses of wine weekly usually on weekends. PCP: Dr. Jodi Mourning  Review of Systems: A complete ROS was  negative except as per HPI.   Physical Exam: Blood pressure 109/71, pulse 77, temperature 98.2 F (36.8 C), temperature source Oral, resp. rate 18, height 5' (1.524 m), weight 130 kg, last menstrual period 04/19/2004, SpO2 95 %. Constitutional: Sleeping comfortably in bed, arouses easily, in no acute distress HENT: Normocephalic and atraumatic, EOMI, conjunctiva normal, moist mucous membranes Cardiovascular: Normal rate, regular rhythm, no murmurs, rubs, gallops.  Distal pulses intact, 1+ pitting edema of BLE Respiratory: Diminished lung sounds throughout, no wheezing or crackles. No accessory muscle use. No increased work of breathing, saturating 95% on RA GI: Nondistended, soft, nontender to palpation, normal active bowel sounds Musculoskeletal: Normal bulk and tone.   Neurological: Is alert and oriented x4, no apparent focal deficits noted. Skin: mild linear patches of erythema around the left areola, similar to a lesser degree around right areola, tender to palpation, no breast mass or dimpling or nipple drainage, no swelling or fluctuance Psychiatric: Normal mood and affect. Behavior is normal. Judgment and thought content normal.    EKG: personally reviewed my interpretation is HR 79, NSR, low voltage c/w COPD  CXR: personally reviewed my interpretation is peribronchial thickening, no cardiomegaly  CT Chest W Contrast  Result Date: 11/27/2021 CLINICAL DATA:  Respiratory illness, nondiagnostic xray Chest pain.  Shortness of breath. EXAM: CT CHEST WITH CONTRAST TECHNIQUE: Multidetector CT imaging of the chest was performed during intravenous contrast administration. RADIATION DOSE REDUCTION: This exam was performed according to the departmental dose-optimization program which includes automated exposure control, adjustment of the mA and/or kV according to patient size and/or use of iterative reconstruction technique. CONTRAST:  13m OMNIPAQUE IOHEXOL 300 MG/ML  SOLN COMPARISON:  Radiograph  yesterday.  Coronary CT 09/21/2018 FINDINGS: Cardiovascular: Minimal aortic atherosclerosis. No aneurysm or evidence of acute aortic finding. Upper normal heart size. Mild dilatation of the main pulmonary artery at 3.2 cm. No filling defect in the central most pulmonary arteries. No pericardial effusion. Mediastinum/Nodes: No mediastinal or hilar adenopathy. Decompressed esophagus. No thyroid nodule. Lungs/Pleura: Mild emphysema and bronchial thickening. Subsegmental atelectasis in the lingula and right lower lobe 4 mm right middle lobe nodule anteriorly series 5, image 77. No confluent consolidation. No features of pulmonary edema. No pleural effusion. Upper Abdomen: No acute upper abdominal findings. Simple cyst in the right kidney. Needs no further follow-up. Musculoskeletal: Thoracic spondylosis with anterior spurring. There are no acute or suspicious osseous abnormalities.  IMPRESSION:  1. Mild emphysema and bronchial thickening. Subsegmental atelectasis in the lingula and right lower lobe.  2. Right middle lobe 4 mm nodule. Although likely benign, if the patient is high-risk, given the morphology and/or location of this nodule a non-contrast chest CT can be considered in 12 months.This recommendation follows the consensus statement: Guidelines for Management of Incidental Pulmonary Nodules Detected on CT Images: From the Fleischner  Society 2017; Radiology 2017; 284:228-243. 3. Mild dilatation of the main pulmonary artery suggesting pulmonary arterial hypertension. Aortic Atherosclerosis (ICD10-I70.0) and Emphysema (ICD10-J43.9). Electronically Signed   By: Keith Rake M.D.   On: 11/27/2021 03:07   Assessment & Plan by Problem: Principal Problem:   Acute respiratory failure with hypoxia (HCC)  Acute hypoxic respiratory due to sepsis COPD exacerbation vs CAP Patient presenting with worsened dyspnea. History of COPD without recent exacerbations or admissions. Sees allergy and immunology for  asthma/COPD overlap although PFT more c/w COPD given lack of reversibility. Recently had first dose of Fasenra although I do not see eosinophilia on recent labs.  Found to be febrile to 101.4 in ED with tachypnea and desaturating dow to 85% and placed on 2L .with improvement On RA at baseline. Leukocytosis of 16.9. lactic acid WNL. CXR c/w asthma exacerbation vs acute bronchitis. CT chest with atelectasis but no focal infiltrates. Started on rocephin and azithromycin for sepsis due to underlying lung infection as well as methylprednisolone for asthma/COPD exacerbation. Clinically improved and now breathing well on room air without wheezing or dyspnea. - Continue azithromycin and ceftriaxone - Prednisone 40 mg daily  - Duonebs q4 hours - IVF - continue LAMA, LABA, steroid inhaler - f/u procalcitonin - f/u  blood cultures - monitor fever curve and CB - O2 to maintain saturation between 88-92%  Bilateral breast pain Bilateral erythema surrounding areolas with burning pain. Does not appear urticarial. Mammogram in August 2022 wnl. Denies personal or family history of breast cancer. No swelling does not seem to have a infectious etiology. -continued to monitor  Chronic urticaria/angioedema Follows with allergy and immunology. She denies any current symptoms such as tongue or lip swelling today. - On cetirizine at home, continue on loratadine while admitted  OSA Does not wear CPAP at home as she cannot tolerate mask. Agreeable to trying tonight -encourage CPAP at night  GERD - continue protonix  History of paroxysmal atrial fibrillation Single episode in 2019, unable to complete heart monitor due to rash. Not on AC due to low CHA2DS2VASc. NSR on EKG today.   Pulmonary nodule Incidental 66m nodule noted in the right middle lobe. Patient with 15 pack year smoking history, quit 8 years ago. Recommend 12 moth follow up. - Outpatient follow up  Dispo: Admit patient to Observation with expected  length of stay less than 2 midnights.  Signed: LIona Beard MD 11/27/2021, 8:18 AM  Pager: [phone] 5pm on weekdays and 1pm on weekends: On Call pager: [phone]

## 2021-11-27 NOTE — ED Notes (Signed)
Patient moved from room 22 to room 44, all belongings at bedside. Report given to [place] C RN.

## 2021-11-27 NOTE — ED Notes (Signed)
Patient transported to CT 

## 2021-11-27 NOTE — Progress Notes (Signed)
Notified bedside nurse of need to draw lactic acid.  

## 2021-11-28 DIAGNOSIS — L508 Other urticaria: Secondary | ICD-10-CM | POA: Diagnosis present

## 2021-11-28 DIAGNOSIS — Z7951 Long term (current) use of inhaled steroids: Secondary | ICD-10-CM | POA: Diagnosis not present

## 2021-11-28 DIAGNOSIS — Z8711 Personal history of peptic ulcer disease: Secondary | ICD-10-CM | POA: Diagnosis not present

## 2021-11-28 DIAGNOSIS — E785 Hyperlipidemia, unspecified: Secondary | ICD-10-CM | POA: Diagnosis present

## 2021-11-28 DIAGNOSIS — J449 Chronic obstructive pulmonary disease, unspecified: Secondary | ICD-10-CM | POA: Diagnosis not present

## 2021-11-28 DIAGNOSIS — D72828 Other elevated white blood cell count: Secondary | ICD-10-CM | POA: Diagnosis present

## 2021-11-28 DIAGNOSIS — J441 Chronic obstructive pulmonary disease with (acute) exacerbation: Secondary | ICD-10-CM | POA: Diagnosis present

## 2021-11-28 DIAGNOSIS — R911 Solitary pulmonary nodule: Secondary | ICD-10-CM | POA: Diagnosis not present

## 2021-11-28 DIAGNOSIS — G4733 Obstructive sleep apnea (adult) (pediatric): Secondary | ICD-10-CM | POA: Diagnosis not present

## 2021-11-28 DIAGNOSIS — J9601 Acute respiratory failure with hypoxia: Secondary | ICD-10-CM | POA: Diagnosis not present

## 2021-11-28 DIAGNOSIS — J189 Pneumonia, unspecified organism: Secondary | ICD-10-CM | POA: Diagnosis present

## 2021-11-28 DIAGNOSIS — Z87891 Personal history of nicotine dependence: Secondary | ICD-10-CM | POA: Diagnosis not present

## 2021-11-28 DIAGNOSIS — N644 Mastodynia: Secondary | ICD-10-CM | POA: Diagnosis not present

## 2021-11-28 DIAGNOSIS — Z79899 Other long term (current) drug therapy: Secondary | ICD-10-CM | POA: Diagnosis not present

## 2021-11-28 DIAGNOSIS — T783XXA Angioneurotic edema, initial encounter: Secondary | ICD-10-CM | POA: Diagnosis not present

## 2021-11-28 DIAGNOSIS — I10 Essential (primary) hypertension: Secondary | ICD-10-CM | POA: Diagnosis present

## 2021-11-28 DIAGNOSIS — K219 Gastro-esophageal reflux disease without esophagitis: Secondary | ICD-10-CM | POA: Diagnosis not present

## 2021-11-28 DIAGNOSIS — I480 Paroxysmal atrial fibrillation: Secondary | ICD-10-CM | POA: Diagnosis present

## 2021-11-28 DIAGNOSIS — R718 Other abnormality of red blood cells: Secondary | ICD-10-CM | POA: Diagnosis not present

## 2021-11-28 DIAGNOSIS — Z91199 Patient's noncompliance with other medical treatment and regimen due to unspecified reason: Secondary | ICD-10-CM | POA: Diagnosis not present

## 2021-11-28 DIAGNOSIS — J440 Chronic obstructive pulmonary disease with acute lower respiratory infection: Secondary | ICD-10-CM | POA: Diagnosis present

## 2021-11-28 DIAGNOSIS — R0602 Shortness of breath: Secondary | ICD-10-CM | POA: Diagnosis present

## 2021-11-28 DIAGNOSIS — Z20822 Contact with and (suspected) exposure to covid-19: Secondary | ICD-10-CM | POA: Diagnosis present

## 2021-11-28 LAB — RETICULOCYTES
Immature Retic Fract: 13.9 % (ref 2.3–15.9)
RBC.: 5.18 MIL/uL — ABNORMAL HIGH (ref 3.87–5.11)
Retic Count, Absolute: 57.5 10*3/uL (ref 19.0–186.0)
Retic Ct Pct: 1.1 % (ref 0.4–3.1)

## 2021-11-28 LAB — BLOOD CULTURE ID PANEL (REFLEXED) - BCID2

## 2021-11-28 LAB — CBC
HCT: 41.2 % (ref 36.0–46.0)
Hemoglobin: 13.1 g/dL (ref 12.0–15.0)
MCH: 24.5 pg — ABNORMAL LOW (ref 26.0–34.0)
MCHC: 31.8 g/dL (ref 30.0–36.0)
MCV: 77.2 fL — ABNORMAL LOW (ref 80.0–100.0)
Platelets: 243 10*3/uL (ref 150–400)
RBC: 5.34 MIL/uL — ABNORMAL HIGH (ref 3.87–5.11)
RDW: 15.4 % (ref 11.5–15.5)
WBC: 15.6 10*3/uL — ABNORMAL HIGH (ref 4.0–10.5)
nRBC: 0 % (ref 0.0–0.2)

## 2021-11-28 LAB — BASIC METABOLIC PANEL
Anion gap: 8 (ref 5–15)
BUN: 13 mg/dL (ref 8–23)
CO2: 24 mmol/L (ref 22–32)
Calcium: 9.4 mg/dL (ref 8.9–10.3)
Chloride: 109 mmol/L (ref 98–111)
Creatinine, Ser: 1.14 mg/dL — ABNORMAL HIGH (ref 0.44–1.00)
GFR, Estimated: 53 mL/min — ABNORMAL LOW (ref >60)
Glucose, Bld: 138 mg/dL — ABNORMAL HIGH (ref 70–99)
Potassium: 3.5 mmol/L (ref 3.5–5.1)
Sodium: 141 mmol/L (ref 135–145)

## 2021-11-28 LAB — IRON AND TIBC
Iron: 23 ug/dL — ABNORMAL LOW (ref 28–170)
Saturation Ratios: 7 % — ABNORMAL LOW (ref 10.4–31.8)
TIBC: 356 ug/dL (ref 250–450)
UIBC: 333 ug/dL

## 2021-11-28 LAB — FERRITIN: Ferritin: 79 ng/mL (ref 11–307)

## 2021-11-28 LAB — STREP PNEUMONIAE URINARY ANTIGEN: Strep Pneumo Urinary Antigen: NEGATIVE

## 2021-11-28 LAB — HIV ANTIBODY (ROUTINE TESTING W REFLEX): HIV Screen 4th Generation wRfx: NONREACTIVE

## 2021-11-28 MED ORDER — IPRATROPIUM-ALBUTEROL 0.5-2.5 (3) MG/3ML IN SOLN
3.0000 mL | Freq: Two times a day (BID) | RESPIRATORY_TRACT | Status: DC
  Administered 2021-11-28 – 2021-11-29 (×2): 3 mL via RESPIRATORY_TRACT
  Filled 2021-11-28 (×2): qty 3

## 2021-11-28 NOTE — TOC Initial Note (Signed)
Transition of Care [facility]) - Initial/Assessment Note    Patient Details  Name: Maria Nelson MRN: [id number] Date of Birth: 21-Mar-1956  [facility]) CM/SW Contact:    Bartholomew Crews, RN Phone Number: [phone] 11/28/2021, 1:12 PM  Clinical Narrative:                  Spoke with patient at the bedside to discuss post acute transition. Patient sitting up in chair preparing to eat lunch. PTA home alone. Independent. Stated her sister or grandson assist with transportation, but her grandson will be leaving for college soon. Discussed Medicaid transportation as an option. Her sister will pick her up at discharge. No TOC needs identified at this time.   Expected Discharge Plan: Home/Self Care Barriers to Discharge: Continued Medical Work up   Patient Goals and CMS Choice Patient states their goals for this hospitalization and ongoing recovery are:: return to her home CMS Medicare.gov Compare Post Acute Care list provided to:: Patient Choice offered to / list presented to : NA  Expected Discharge Plan and Services Expected Discharge Plan: Home/Self Care   Discharge Planning Services: CM Consult Post Acute Care Choice: NA                   DME Arranged: N/A DME Agency: NA       HH Arranged: NA HH Agency: NA        Prior Living Arrangements/Services   Lives with:: Self Patient language and need for interpreter reviewed:: Yes Do you feel safe going back to the place where you live?: Yes      Need for Family Participation in Patient Care: No (Comment)     Criminal Activity/Legal Involvement Pertinent to Current Situation/Hospitalization: No - Comment as needed  Activities of Daily Living      Permission Sought/Granted                  Emotional Assessment Appearance:: Appears stated age Attitude/Demeanor/Rapport: Engaged Affect (typically observed): Accepting Orientation: : Oriented to Self, Oriented to Place, Oriented to  Time,  Oriented to Situation Alcohol / Substance Use: Not Applicable Psych Involvement: No (comment)  Admission diagnosis:  COPD exacerbation ([place]) [J44.1] Acute respiratory failure with hypoxia ([place]) [J96.01] Patient Active Problem List   Diagnosis Date Noted   Acute respiratory failure with hypoxia ([place]) 11/27/2021   Benign paroxysmal positional vertigo 09/03/2021   Colon cancer screening 07/14/2021   Healthcare maintenance 01/28/2021   Hyperlipidemia 11/22/2019   UTI (urinary tract infection) 11/22/2019   Adnexal pain 09/06/2018   Rash 05/11/2018   Paroxysmal atrial fibrillation ([place]) 08/24/2017   Dyspepsia 08/24/2017   Knee pain, left 08/16/2016   Back pain 06/23/2016   Urticaria 07/01/2015   Asthma with COPD 03/04/2015   Allergic rhinitis with nonallergic component 03/04/2015   COPD (chronic obstructive pulmonary disease) ([place]) 06/19/2014   Blood pressure elevated without history of HTN 03/05/2014   HNP (herniated nucleus pulposus), lumbar 04/16/2013   Tenosynovitis, de Quervain 11/15/2012   Morbid obesity with BMI of 45.0-49.9, adult ([place]) 09/08/2012   Carpal tunnel syndrome 06/23/2011   Shoulder pain 09/02/2010   Sleep apnea 09/02/2010   GERD 12/24/2009   Urticaria/angioedema 12/16/2009   STRESS INCONTINENCE 01/24/2007   History of peptic ulcer disease 07/26/2006   PCP:  Serita Butcher, MD Pharmacy:   [place], [place] - [address] [place] [address] Phone: [phone] Fax: [phone]  Social Determinants of Health (SDOH) Interventions    Readmission Risk Interventions     No data to display           

## 2021-11-28 NOTE — Progress Notes (Signed)
Pt declined CPAP for tonight. 

## 2021-11-28 NOTE — Evaluation (Signed)
Physical Therapy Evaluation Patient Details Name: Maria Nelson MRN: [id number] DOB: 08/16/1955 Today's Date: 11/28/2021  History of Present Illness  Pt is a 66 year old female with a PMH COPD, chronic urticaria, GERD, paroxsymal afib who presents to [facility] for dyspnea and bilateral breast pain. She was at her friends house around 2pm when she noticed chills and bilateral breast pain. She went home tried to use a heating pad and fell asleep. Later wok up feeling worse. Endorses malaise, worsened dyspnea, chest tightness and generalized body aches. Endorsed wheezing to ED provider but denied any wheezing during this interview. She uses breztri and albuterol as needed. Was using albuterol more frequently when weather was hot, but has been staying indoors and not needed albuterol in the last week. She follow with allergy and immunology and received a sample of Fasenra on 7/17.  Clinical Impression  Pt agreeable to physical therapy evaluation. Pt performing all mobility at independent level. Pt appears to be at her baseline and no further skilled, acute care physical therapy indicated. Pt educated on staying mobile while in hospital and will be seen by mobility team.   Recommendations for follow up therapy are one component of a multi-disciplinary discharge planning process, led by the attending physician.  Recommendations may be updated based on patient status, additional functional criteria and insurance authorization.  Follow Up Recommendations No PT follow up      Assistance Recommended at Discharge PRN  Patient can return home with the following  Assist for transportation    Equipment Recommendations None recommended by PT  Recommendations for Other Services       Functional Status Assessment Patient has had a recent decline in their functional status and demonstrates the ability to make significant improvements in function in a reasonable and predictable amount of time.      Precautions / Restrictions Precautions Precautions: Fall Restrictions Weight Bearing Restrictions: No      Mobility  Bed Mobility Overal bed mobility: Independent, Needs Assistance Bed Mobility: Supine to Sit, Sit to Supine     Supine to sit: Min assist, Independent Sit to supine: Independent   General bed mobility comments: Pt required min HHA initially for supine to sit when HOB elevated. Upon returning to room after gait training, pt performed sit to supine followed by supine to sit independently (HOB flat specific to her home environment).    Transfers Overall transfer level: Independent Equipment used: None               General transfer comment: Two sit <> stands performed.    Ambulation/Gait Ambulation/Gait assistance: Independent Gait Distance (Feet): 275 Feet Assistive device: None Gait Pattern/deviations: WFL(Within Functional Limits)   Gait velocity interpretation: 1.31 - 2.62 ft/sec, indicative of limited community ambulator   General Gait Details: Pt with mild unsteadiness but no LOB occurred. Pt ambulating at her baseline level.  Stairs            Wheelchair Mobility    Modified Rankin (Stroke Patients Only)       Balance Overall balance assessment: Independent                               Standardized Balance Assessment Standardized Balance Assessment :  (Pt with minimal unsteadiness with semi-tandem stance)           Pertinent Vitals/Pain Pain Assessment Pain Assessment: No/denies pain    Home Living Family/patient expects to be discharged  to:: Private residence Living Arrangements: Alone Available Help at Discharge: Available 24 hours/day (sister) Type of Home: House Home Access: Stairs to enter Entrance Stairs-Rails: Can reach both Entrance Stairs-Number of Steps: 4   Home Layout: One level Home Equipment: Other (comment) (has oxygen but does not use)      Prior Function Prior Level of Function :  Independent/Modified Independent;Driving (pt does not have a car and her sister takes her to store; pt is retired) Pt just finished up outpatient physical therapy to address vertigo and shoulder pain.             Mobility Comments: Does not use AD. ADLs Comments: Independent with ADL's, managing medications, etc.     Hand Dominance        Extremity/Trunk Assessment   Upper Extremity Assessment Upper Extremity Assessment: Overall WFL for tasks assessed    Lower Extremity Assessment Lower Extremity Assessment: Overall WFL for tasks assessed       Communication   Communication: No difficulties  Cognition Arousal/Alertness: Awake/alert Behavior During Therapy: WFL for tasks assessed/performed Overall Cognitive Status: Within Functional Limits for tasks assessed                                          General Comments General comments (skin integrity, edema, etc.): SpO2 93% on 2 L O2    Exercises     Assessment/Plan    PT Assessment Patient does not need any further PT services  PT Problem List         PT Treatment Interventions      PT Goals (Current goals can be found in the Care Plan section)       Frequency       Co-evaluation               AM-PAC PT "6 Clicks" Mobility  Outcome Measure Help needed turning from your back to your side while in a flat bed without using bedrails?: None Help needed moving from lying on your back to sitting on the side of a flat bed without using bedrails?: None Help needed moving to and from a bed to a chair (including a wheelchair)?: None Help needed standing up from a chair using your arms (e.g., wheelchair or bedside chair)?: None Help needed to walk in hospital room?: None Help needed climbing 3-5 steps with a railing? : None 6 Click Score: 24    End of Session Equipment Utilized During Treatment: Gait belt;Oxygen Activity Tolerance: Patient tolerated treatment well Patient left: with call  bell/phone within reach;in chair   PT Visit Diagnosis: Other abnormalities of gait and mobility (R26.89)    Time: 8916-9450 PT Time Calculation (min) (ACUTE ONLY): 26 min   Charges:   PT Evaluation $PT Eval Low Complexity: [place], PT   [facility] 11/28/2021, 9:51 AM

## 2021-11-28 NOTE — Care Management Obs Status (Signed)
[place] NOTIFICATION   Patient Details  Name: Maria Nelson MRN: [id number] Date of Birth: 1955/05/06   Medicare Observation Status Notification Given:  Yes    Bartholomew Crews, RN 11/28/2021, 1:11 PM

## 2021-11-28 NOTE — Progress Notes (Signed)
PHARMACY - PHYSICIAN COMMUNICATION CRITICAL VALUE ALERT - BLOOD CULTURE IDENTIFICATION (BCID)  Maria Nelson is an 66 y.o. female who presented to [facility] on 11/26/2021 with a chief complaint of dyspnea/itching    Name of physician (or Provider) Contacted: Dr. Jodell Cipro  Current antibiotics: Ceftriaxone, Azithromycin   Changes to prescribed antibiotics recommended:  None  Results for orders placed or performed during the hospital encounter of 11/26/21  Blood Culture ID Panel (Reflexed) (Collected: 11/27/2021  2:53 AM)  Result Value Ref Range   Enterococcus faecalis NOT DETECTED NOT DETECTED   Enterococcus Faecium NOT DETECTED NOT DETECTED   Listeria monocytogenes NOT DETECTED NOT DETECTED   Staphylococcus species DETECTED (A) NOT DETECTED   Staphylococcus aureus (BCID) NOT DETECTED NOT DETECTED   Staphylococcus epidermidis DETECTED (A) NOT DETECTED   Staphylococcus lugdunensis NOT DETECTED NOT DETECTED   Streptococcus species NOT DETECTED NOT DETECTED   Streptococcus agalactiae NOT DETECTED NOT DETECTED   Streptococcus pneumoniae NOT DETECTED NOT DETECTED   Streptococcus pyogenes NOT DETECTED NOT DETECTED   A.calcoaceticus-baumannii NOT DETECTED NOT DETECTED   Bacteroides fragilis NOT DETECTED NOT DETECTED   Enterobacterales NOT DETECTED NOT DETECTED   Enterobacter cloacae complex NOT DETECTED NOT DETECTED   Escherichia coli NOT DETECTED NOT DETECTED   Klebsiella aerogenes NOT DETECTED NOT DETECTED   Klebsiella oxytoca NOT DETECTED NOT DETECTED   Klebsiella pneumoniae NOT DETECTED NOT DETECTED   Proteus species NOT DETECTED NOT DETECTED   Salmonella species NOT DETECTED NOT DETECTED   Serratia marcescens NOT DETECTED NOT DETECTED   Haemophilus influenzae NOT DETECTED NOT DETECTED   Neisseria meningitidis NOT DETECTED NOT DETECTED   Pseudomonas aeruginosa NOT DETECTED NOT DETECTED   Stenotrophomonas maltophilia NOT DETECTED NOT DETECTED   Candida albicans NOT  DETECTED NOT DETECTED   Candida auris NOT DETECTED NOT DETECTED   Candida glabrata NOT DETECTED NOT DETECTED   Candida krusei NOT DETECTED NOT DETECTED   Candida parapsilosis NOT DETECTED NOT DETECTED   Candida tropicalis NOT DETECTED NOT DETECTED   Cryptococcus neoformans/gattii NOT DETECTED NOT DETECTED   Methicillin resistance mecA/C NOT DETECTED NOT DETECTED    Narda Bonds 11/28/2021  5:57 AM

## 2021-11-28 NOTE — Plan of Care (Signed)

## 2021-11-28 NOTE — Progress Notes (Signed)
HD#0 Subjective:   Summary: Maria Nelson is a 66 year old female with PMH of COPD, chronic urticaria, GERD, and paroxysmal a-fib who presented with bilateral breast pain and dyspnea and was admitted for COPD exacerbation.   Overnight Events: No acute events.  This morning patient is doing well and states that her breathing has improved overnight and she is no complaints of dyspnea. Does not use oxygen at home and has not in the past.  When asked about her rash and breast pain she states that she noticed the breast pain come on acutely did not notice any rash before and only noticed it after it was pointed out to her at the hospital.  She states that the pain started around her nipple and was a feeling as if her nipple was cold.  Later the pain spread and when she took her bra off her whole breast hurt and felt like it was pulling down.  She denies any of this pain right now.  Objective:  Vital signs in last 24 hours: Vitals:   11/28/21 0013 11/28/21 0448 11/28/21 0737 11/28/21 0841  BP: 111/66 114/69 101/61   Pulse: 68 (!) 54 60 61  Resp:  '17 16 16  '$ Temp: 97.6 F (36.4 C) 97.9 F (36.6 C) 97.9 F (36.6 C)   TempSrc: Oral Oral Oral   SpO2: 96% 96% 99% 98%  Weight:      Height:       Supplemental O2: Nasal Cannula SpO2: 98 % O2 Flow Rate (L/min): 2 L/min   Physical Exam:  Constitutional: well-appearing female sitting in bed, in no acute distress HENT: normocephalic atraumatic Eyes: conjunctiva non-erythematous Neck: supple Cardiovascular: regular rate and rhythm Pulmonary/Chest: normal work of breathing on room air, lungs clear to auscultation bilaterally Abdominal: soft, non-tender, non-distended MSK: normal bulk and tone, trace to 1+ lower extremity edema below the knees Neurological: alert & oriented x 3 Skin: warm and dry, mild erythema around bilateral areolas, nontender, not warm, no bleeding or discharge, no raised lesions, no dimpling  Filed Weights    11/26/21 2039  Weight: 130 kg     Intake/Output Summary (Last 24 hours) at 11/28/2021 0925 Last data filed at 11/28/2021 0437 Gross per 24 hour  Intake 1250.97 ml  Output --  Net 1250.97 ml   Net IO Since Admission: 1,250.97 mL [11/28/21 0925]  Pertinent Labs:    Latest Ref Rng & Units 11/28/2021   12:44 AM 11/26/2021    8:54 PM 06/19/2021   12:12 PM  CBC  WBC 4.0 - 10.5 K/uL 15.6  16.9  8.6   Hemoglobin 12.0 - 15.0 g/dL 13.1  13.7  13.8   Hematocrit 36.0 - 46.0 % 41.2  42.6  43.9   Platelets 150 - 400 K/uL 243  249         Latest Ref Rng & Units 11/28/2021   12:44 AM 11/26/2021    8:54 PM 07/13/2021    3:54 PM  CMP  Glucose 70 - 99 mg/dL 138  104  76   BUN 8 - 23 mg/dL '13  12  9   '$ Creatinine 0.44 - 1.00 mg/dL 1.14  1.28  1.16   Sodium 135 - 145 mmol/L 141  137  141   Potassium 3.5 - 5.1 mmol/L 3.5  4.1  4.4   Chloride 98 - 111 mmol/L 109  105  104   CO2 22 - 32 mmol/L '24  25  23   '$ Calcium 8.9 -  10.3 mg/dL 9.4  9.3  9.8     Imaging: No results found.  Assessment/Plan:   Principal Problem:   Acute respiratory failure with hypoxia (HCC) Active Problems:   Sleep apnea   COPD (chronic obstructive pulmonary disease) ([address])   Patient Summary: Maria Nelson is a 66 year old female with PMH of COPD, chronic urticaria, GERD, and paroxysmal a-fib who presented with bilateral breast pain and dyspnea and was admitted for COPD exacerbation.    Acute hypoxic respiratory due to sepsis COPD exacerbation vs CAP Patient presenting with worsened dyspnea. History of COPD without recent exacerbations or admissions, PFTs last month show FEV1/FVC ratio of 0.82 at 93% of expected. Clinically improved but still on supplemental oxygen.  Persistent leukocytosis of 15.6, Pro-Cal 0.51, lactic acid of 1, stable troponins at 6, RVP negative, single blood culture showed staph epi.  She is afebrile and we are currently covering for staph epi and worried that this is a contaminant. We will  redraw blood cultures to affirm. - Continue azithromycin 500 mg daily and ceftriaxone grams daily, day 2/5, last dose 8/15 - Prednisone 40 mg daily for 5 days, day 2/5, last dose 8/15 - Duonebs q4 hours - continue LABA, steroid inhaler - repeat blood culture - monitor fever curve and CB - O2 to maintain saturation between 88-92%   Bilateral breast pain Bilateral erythema surrounding areolas with burning pain. Does not appear urticarial. Mammogram in August 2022 wnl. Denies personal or family history of breast cancer. No swelling does not seem to have a infectious etiology.  Seems to be improving with steroids and/or antibiotics. -continued to monitor   Chronic urticaria/angioedema Follows with allergy and immunology. She denies any current symptoms such as tongue or lip swelling today. - On cetirizine at home, continue on loratadine while admitted - Add hydroxyzine 25 mg 3 times daily prn  Microcytosis Iron studies show low iron of 23 with saturation of 7 and ferritin of 79 with reticulocyte index of 1.08 indicating hypoproliferation. - We will hold iron for now as we work-up her rash and treat her COPD exacerbation - Plan to start ferrous sulfate 325 mg MWF at follow up    OSA Does not wear CPAP at home as she cannot tolerate mask. Agreeable to trying tonight -encourage CPAP at night   GERD - continue protonix   History of paroxysmal atrial fibrillation Single episode in 2019, unable to complete heart monitor due to rash. Not on AC due to low CHA2DS2VASc of 2.  NSR on EKG. Will continue to monitor.   Pulmonary nodule Incidental 18m nodule noted in the right middle lobe. Patient with 15 pack year smoking history, quit 8 years ago. Recommend 12 moth follow up. - Outpatient follow up  Diet: Normal IVF: None,None VTE: Enoxaparin Code: Full PT/OT recs: None, none. TOC recs: None.    Dispo: Anticipated discharge to Home in 1-2 days pending continued stability and further  work-up for rash.   J[facility] LodgeInternal Medicine Resident PGY-1 Pager: [phone] Please contact the on call pager after 5 pm and on weekends at [phone]

## 2021-11-28 NOTE — Plan of Care (Signed)

## 2021-11-29 DIAGNOSIS — T783XXA Angioneurotic edema, initial encounter: Secondary | ICD-10-CM

## 2021-11-29 DIAGNOSIS — R718 Other abnormality of red blood cells: Secondary | ICD-10-CM

## 2021-11-29 LAB — BASIC METABOLIC PANEL
Anion gap: 9 (ref 5–15)
BUN: 19 mg/dL (ref 8–23)
CO2: 24 mmol/L (ref 22–32)
Calcium: 9.5 mg/dL (ref 8.9–10.3)
Chloride: 110 mmol/L (ref 98–111)
Creatinine, Ser: 1.3 mg/dL — ABNORMAL HIGH (ref 0.44–1.00)
GFR, Estimated: 45 mL/min — ABNORMAL LOW (ref >60)
Glucose, Bld: 124 mg/dL — ABNORMAL HIGH (ref 70–99)
Potassium: 3.7 mmol/L (ref 3.5–5.1)
Sodium: 143 mmol/L (ref 135–145)

## 2021-11-29 LAB — CBC WITH DIFFERENTIAL/PLATELET
Abs Immature Granulocytes: 0.08 10*3/uL — ABNORMAL HIGH (ref 0.00–0.07)
Basophils Absolute: 0.1 10*3/uL (ref 0.0–0.1)
Basophils Relative: 0 %
Eosinophils Absolute: 0 10*3/uL (ref 0.0–0.5)
Eosinophils Relative: 0 %
HCT: 41.2 % (ref 36.0–46.0)
Hemoglobin: 13.1 g/dL (ref 12.0–15.0)
Immature Granulocytes: 1 %
Lymphocytes Relative: 14 %
Lymphs Abs: 2.3 10*3/uL (ref 0.7–4.0)
MCH: 25 pg — ABNORMAL LOW (ref 26.0–34.0)
MCHC: 31.8 g/dL (ref 30.0–36.0)
MCV: 78.6 fL — ABNORMAL LOW (ref 80.0–100.0)
Monocytes Absolute: 1.2 10*3/uL — ABNORMAL HIGH (ref 0.1–1.0)
Monocytes Relative: 7 %
Neutro Abs: 12.8 10*3/uL — ABNORMAL HIGH (ref 1.7–7.7)
Neutrophils Relative %: 78 %
Platelets: 260 10*3/uL (ref 150–400)
RBC: 5.24 MIL/uL — ABNORMAL HIGH (ref 3.87–5.11)
RDW: 15.4 % (ref 11.5–15.5)
WBC: 16.3 10*3/uL — ABNORMAL HIGH (ref 4.0–10.5)
nRBC: 0 % (ref 0.0–0.2)

## 2021-11-29 MED ORDER — AZITHROMYCIN 500 MG PO TABS: 500.0000 mg | ORAL_TABLET | Freq: Every day | ORAL | 0 refills | Status: AC

## 2021-11-29 MED ORDER — CEFDINIR 300 MG PO CAPS: 600.0000 mg | ORAL_CAPSULE | Freq: Every day | ORAL | 0 refills | Status: DC

## 2021-11-29 MED ORDER — HYDROCORTISONE 2.5 % EX CREA: TOPICAL_CREAM | Freq: Two times a day (BID) | CUTANEOUS | 0 refills | Status: AC | PRN

## 2021-11-29 MED ORDER — ALBUTEROL SULFATE (2.5 MG/3ML) 0.083% IN NEBU: INHALATION_SOLUTION | RESPIRATORY_TRACT | 3 refills | Status: DC

## 2021-11-29 MED ORDER — PREDNISONE 20 MG PO TABS: 40.0000 mg | ORAL_TABLET | Freq: Every day | ORAL | 0 refills | Status: DC

## 2021-11-29 NOTE — Hospital Course (Addendum)
Acute hypoxic respiratory due to sepsis COPD exacerbation vs CAP Presented with acute worsening dyspnea.  She has a history of COPD with PFTs last month showing FEV1/FVC ratio of 0.82 at 93% of expected.  On arrival she was febrile at 101.4 and tachypneic with O2 sats down to 85.  This improved on 2 L nasal cannula.  She had leukocytosis of 16.9, Pro-Cal 0.51, lactic acid of 1, stable troponins at 6, RVP negative, single blood culture showed staph epi.  Remainder of his she remained afebrile and satted well on 2 L before being transitioned to room air quite quickly.  We redrew blood cultures as staph epi is a common contaminant and she did not appear to be showing any signs or symptoms of sepsis.  We continued her on 5 days of ceftriaxone and azithromycin which were changed to cefdinir and azithromycin on discharge.  Also was started on a 5-day course of prednisone 40 mg daily.  Discharge she was satting very well on room air and had no complaints of dyspnea or cough.   Bilateral breast pain Described a pain in her bilateral nipples that began as a feeling of coldness and then into pain in the spread throughout her breast.  She notes when she took off her bra that the pain worsened and it felt like her breast were being pulled.  She had erythema surrounding areolas with burning pain.  It did not appear urticarial on admission. Mammogram in August 2022 wnl. Denied personal or family history of breast cancer.  There was low suspicion for infectious etiology.  She showed rapid improvement in pain after starting supplemental O2.  She should continue improvements on steroids and antibiotics.  There was still a large edematous area on her left breast lateral to the nipple and areola that was nontender and not warm.  We prescribed her some hydrocortisone cream to use topically as needed and she will follow-up with her primary care to reevaluate.   Chronic urticaria/angioedema Follows with allergy and immunology and  got a [place] shot about a month ago.  She is due to get another one this coming Monday. She denied any current symptoms such as tongue or lip swelling during admission.  She is on cetirizine at home so we continued her on loratadine while she was admitted and added hydroxyzine as needed which she did not use.   Microcytosis MCV 77.  Not anemic with stable hemoglobin at 13.  Iron studies show low iron of 23 with saturation of 7 and ferritin of 79 with reticulocyte index of 1.08 indicating hypoproliferation.  We did not start iron in the setting of possible acute infection but recommend that she start this on follow-up.  Recommend ferrous sulfate 325 mg oral MWF.   OSA Does not wear CPAP at home as she cannot tolerate mask.  She did not wear one during her admission for the same reason.  Encouraged her to keep trying with her CPAP and look at other types of masks.   GERD No complaints of GERD during admission, continued on home Protonix.   History of paroxysmal atrial fibrillation Single episode in 2019, unable to complete heart monitor due to rash. Not on anticoagulation due to low CHA2DS2VASc of 2. NSR on EKG.    Pulmonary nodule Incidental 27m nodule noted in the right middle lobe on CT. Patient with 15 pack year smoking history, quit 8 years ago. Recommend 12 moth follow up.  Message sent to Dr. IValeta Harmsto see if they  would coordinate this with initiative to have better follow-up for radiology findings like this.

## 2021-11-29 NOTE — Progress Notes (Signed)
[name] to be D/C'd  per MD order.  Discussed with the patient and all questions fully answered.  VSS, Skin clean, dry and intact without evidence of skin break down, no evidence of skin tears noted.  IV catheter discontinued intact. Site without signs and symptoms of complications. Dressing and pressure applied.  An After Visit Summary was printed and given to the patient.   D/c education completed with patient/family including follow up instructions, medication list, d/c activities limitations if indicated, with other d/c instructions as indicated by MD - patient able to verbalize understanding, all questions fully answered.   Patient instructed to return to ED, call 911, or call MD for any changes in condition.   Patient to be escorted via [place], and D/C home via private auto.

## 2021-11-29 NOTE — Progress Notes (Signed)
Mobility Specialist Criteria Algorithm Info.   11/29/21 1258  Mobility  Activity Ambulated independently in hallway (In recliner before and after)  Range of Motion/Exercises Active;All extremities  Level of Assistance Independent  Assistive Device None  Distance Ambulated (ft) 550 ft  Activity Response Tolerated well   Patient received in recliner agreeable to participate in mobility. Ambulated independently with steady gait. Returned to room without complaint or incident. Was left in recliner with all needs met, call bell in reach.   Maria Nelson, [place], [place]  QZYTM:[phone] Office: [phone]

## 2021-11-29 NOTE — Discharge Instructions (Addendum)
You were hospitalized for COPD Exacerbation. Thank you for allowing [place] to be part of your care.   We arranged for you to follow up at:  [facility] will call [facility] with the information.   Please note these changes made to your medications:   Please START taking:  Azithromycin 500 mg daily for 2 more days Cefdinir 600 mg daily for 2 more days Prednisone 40 mg daily for 2 more days Hydrocortisone 2.5% Cream 2 times a day as needed for your rash, start using this after you finish the prednisone pills  Please make sure to return to the hospital if you have new or worsening shortness of breath, chest pain, fevers, or dizziness.  Please call our clinic if you have any questions or concerns, we may be able to help and keep you from a long and expensive emergency room wait. Our clinic and after hours phone number is [phone], the best time to call is Monday through Friday 9 am to 4 pm but there is always someone available 24/7 if you have an emergency. If you need medication refills please notify your pharmacy one week in advance and they will send [place] a request.

## 2021-11-29 NOTE — Discharge Summary (Addendum)
Name: Maria Nelson MRN: [id number] DOB: 02-12-1956 66 y.o. PCP: Serita Butcher, MD  Date of Admission: 11/26/2021  8:31 PM Date of Discharge: 11/29/2021 Attending Physician: Dr.  Lottie Mussel, MD  Discharge Diagnosis: Principal Problem:   Acute respiratory failure with hypoxia [facility]) Active Problems:   Sleep apnea   COPD (chronic obstructive pulmonary disease) ([place])    Discharge Medications: Allergies as of 11/29/2021       Reactions   Bee Venom Anaphylaxis   Aspirin Nausea Only   Other Rash   EKG pads cause burning sensation as soon as applied        Medication List     TAKE these medications    albuterol 108 (90 Base) MCG/ACT inhaler Commonly known as: Ventolin HFA Inhale 2 puffs into the lungs every 6 (six) hours as needed for wheezing or shortness of breath. What changed: Another medication with the same name was changed. Make sure you understand how and when to take each.   albuterol (2.5 MG/3ML) 0.083% nebulizer solution Commonly known as: PROVENTIL Use 1 vial in nebulizer up to 3 times a day when albuterol inhaler not working. If no better or getting worse, call 911 or physician line. What changed: additional instructions   Arnuity Ellipta 100 MCG/ACT Aepb Generic drug: Fluticasone Furoate Inhale 1 puff into the lungs daily.   azithromycin 500 MG tablet Commonly known as: Zithromax Take 1 tablet (500 mg total) by mouth daily for 2 days. Take 1 tablet daily for 3 days.   Breztri Aerosphere 160-9-4.8 MCG/ACT Aero Generic drug: Budeson-Glycopyrrol-Formoterol Inhale 2 puffs into the lungs 2 (two) times daily.   cefdinir 300 MG capsule Commonly known as: OMNICEF Take 2 capsules (600 mg total) by mouth daily.   cetirizine 10 MG tablet Commonly known as: ZYRTEC Take 1 tablet (10 mg total) by mouth daily.   EPINEPHrine 0.3 mg/0.3 mL Soaj injection Commonly known as: EpiPen 2-Pak Inject 0.3 mg into the muscle as needed (for allergic  reaction).   Fasenra 30 MG/ML Sosy Generic drug: Benralizumab Inject 30 mg into the skin every 30 (thirty) days.   hydrocortisone 2.5 % cream Apply topically 2 (two) times daily as needed for up to 14 days. Start using after completing oral prednisone prescription.   levocetirizine 5 MG tablet Commonly known as: XYZAL Take 1 tablet (5 mg total) by mouth every evening.   pantoprazole 40 MG tablet Commonly known as: PROTONIX Take 1 tablet (40 mg total) by mouth daily.   predniSONE 20 MG tablet Commonly known as: DELTASONE Take 2 tablets (40 mg total) by mouth daily with breakfast. Start taking on: November 30, 2021        Disposition and follow-up:   Maria Nelson was discharged from [facility] in Good condition.  At the hospital follow up visit please address:  1.  Follow-up:  a.  Follow-up on resolution of COPD exacerbation and completion of steroids and antibiotics.  Follow-up blood cultures taken prior to discharge.    b.  Evaluate breast to monitor her rash.   c.  Recheck her CBC for anemia now that we have found she is iron deficient.  Encourage CPAP use.   d.  Ensure that she has a 2-monthfollow-up for her 4 mm right middle lobe lung nodule.  2.  Labs / imaging needed at time of follow-up: CBC  3.  Pending labs/ test needing follow-up: Blood cultures  4.  Medication Changes  Abx -azithromycin and ceftriaxone  End Date: 12/02/2021 Prednisone 40 mg daily for 2 more days, end date 12/02/2021  Follow-up Appointments:   Follow-up with internal medicine clinic  Hospital Course by problem list: Acute hypoxic respiratory due to sepsis COPD exacerbation vs CAP Presented with acute worsening dyspnea.  She has a history of COPD with PFTs last month showing FEV1/FVC ratio of 0.82 at 93% of expected.  On arrival she was febrile at 101.4 and tachypneic with O2 sats down to 85.  This improved on 2 L nasal cannula.  She had leukocytosis of  16.9, Pro-Cal 0.51, lactic acid of 1, stable troponins at 6, RVP negative, single blood culture showed staph epi.  Remainder of his she remained afebrile and satted well on 2 L before being transitioned to room air quite quickly.  We redrew blood cultures as staph epi is a common contaminant and she did not appear to be showing any signs or symptoms of sepsis.  We continued her on 5 days of ceftriaxone and azithromycin which were changed to cefdinir and azithromycin on discharge.  Also was started on a 5-day course of prednisone 40 mg daily.  Discharge she was satting very well on room air and had no complaints of dyspnea or cough.   Bilateral breast pain Described a pain in her bilateral nipples that began as a feeling of coldness and then into pain in the spread throughout her breast.  She notes when she took off her bra that the pain worsened and it felt like her breast were being pulled.  She had erythema surrounding areolas with burning pain.  It did not appear urticarial on admission. Mammogram in August 2022 wnl. Denied personal or family history of breast cancer.  There was low suspicion for infectious etiology.  She showed rapid improvement in pain after starting supplemental O2.  She should continue improvements on steroids and antibiotics.  There was still a large edematous area on her left breast lateral to the nipple and areola that was nontender and not warm.  We prescribed her some hydrocortisone cream to use topically as needed and she will follow-up with her primary care to reevaluate.   Chronic urticaria/angioedema Follows with allergy and immunology and got a [place] shot about a month ago.  She is due to get another one this coming Monday. She denied any current symptoms such as tongue or lip swelling during admission.  She is on cetirizine at home so we continued her on loratadine while she was admitted and added hydroxyzine as needed which she did not use.   Microcytosis MCV 77.  Not  anemic with stable hemoglobin at 13.  Iron studies show low iron of 23 with saturation of 7 and ferritin of 79 with reticulocyte index of 1.08 indicating hypoproliferation.  We did not start iron in the setting of possible acute infection but recommend that she start this on follow-up.  Recommend ferrous sulfate 325 mg oral MWF.   OSA Does not wear CPAP at home as she cannot tolerate mask.  She did not wear one during her admission for the same reason.  Encouraged her to keep trying with her CPAP and look at other types of masks.   GERD No complaints of GERD during admission, continued on home Protonix.   History of paroxysmal atrial fibrillation Single episode in 2019, unable to complete heart monitor due to rash. Not on anticoagulation due to low CHA2DS2VASc of 2. NSR on EKG.    Pulmonary nodule Incidental 72m nodule noted in the  right middle lobe on CT. Patient with 15 pack year smoking history, quit 8 years ago. Recommend 12 moth follow up.  Message sent to Dr. Valeta Harms to see if they would coordinate this with initiative to have better follow-up for radiology findings like this.   Discharge Subjective: Patient is feeling very well this morning and has no new or worsening complaints.  She denies any dyspnea overnight and has not use any supplemental oxygen since yesterday morning.  She is excited to go home.  Discharge Exam:   BP (!) 143/87 (BP Location: Right Arm)   Pulse (!) 58   Temp 98.2 F (36.8 C) (Oral)   Resp 17   Ht 5' (1.524 m)   Wt 130 kg   LMP 04/19/2004 (Approximate)   SpO2 97%   BMI 55.97 kg/m  Constitutional: well-appearing female sitting in bed, in no acute distress HENT: normocephalic atraumatic Eyes: conjunctiva non-erythematous Neck: supple Cardiovascular: regular rate and rhythm Pulmonary/Chest: normal work of breathing on room air, lungs clear to auscultation bilaterally Abdominal: soft, non-tender, non-distended MSK: normal bulk and tone, trace to 1+ lower  extremity edema below the knees Neurological: alert & oriented x 3 Skin: warm and dry, mild erythema around bilateral areolas, large area of erythema to the left of the areola on the left breast, nontender, not warm, no bleeding or discharge, no raised lesions, no dimpling   Pertinent Labs, Studies, and Procedures:     Latest Ref Rng & Units 11/29/2021   12:35 AM 11/28/2021   12:44 AM 11/26/2021    8:54 PM  CBC  WBC 4.0 - 10.5 K/uL 16.3  15.6  16.9   Hemoglobin 12.0 - 15.0 g/dL 13.1  13.1  13.7   Hematocrit 36.0 - 46.0 % 41.2  41.2  42.6   Platelets 150 - 400 K/uL 260  243  249        Latest Ref Rng & Units 11/29/2021   12:35 AM 11/28/2021   12:44 AM 11/26/2021    8:54 PM  CMP  Glucose 70 - 99 mg/dL 124  138  104   BUN 8 - 23 mg/dL '19  13  12   '$ Creatinine 0.44 - 1.00 mg/dL 1.30  1.14  1.28   Sodium 135 - 145 mmol/L 143  141  137   Potassium 3.5 - 5.1 mmol/L 3.7  3.5  4.1   Chloride 98 - 111 mmol/L 110  109  105   CO2 22 - 32 mmol/L '24  24  25   '$ Calcium 8.9 - 10.3 mg/dL 9.5  9.4  9.3     CT Chest W Contrast  Result Date: 11/27/2021 CLINICAL DATA:  Respiratory illness, nondiagnostic xray Chest pain.  Shortness of breath. EXAM: CT CHEST WITH CONTRAST TECHNIQUE: Multidetector CT imaging of the chest was performed during intravenous contrast administration. RADIATION DOSE REDUCTION: This exam was performed according to the departmental dose-optimization program which includes automated exposure control, adjustment of the mA and/or kV according to patient size and/or use of iterative reconstruction technique. CONTRAST:  48m OMNIPAQUE IOHEXOL 300 MG/ML  SOLN COMPARISON:  Radiograph yesterday.  Coronary CT 09/21/2018 FINDINGS: Cardiovascular: Minimal aortic atherosclerosis. No aneurysm or evidence of acute aortic finding. Upper normal heart size. Mild dilatation of the main pulmonary artery at 3.2 cm. No filling defect in the central most pulmonary arteries. No pericardial effusion.  Mediastinum/Nodes: No mediastinal or hilar adenopathy. Decompressed esophagus. No thyroid nodule. Lungs/Pleura: Mild emphysema and bronchial thickening. Subsegmental atelectasis in the lingula and  right lower lobe 4 mm right middle lobe nodule anteriorly series 5, image 77. No confluent consolidation. No features of pulmonary edema. No pleural effusion. Upper Abdomen: No acute upper abdominal findings. Simple cyst in the right kidney. Needs no further follow-up. Musculoskeletal: Thoracic spondylosis with anterior spurring. There are no acute or suspicious osseous abnormalities. IMPRESSION: 1. Mild emphysema and bronchial thickening. Subsegmental atelectasis in the lingula and right lower lobe. 2. Right middle lobe 4 mm nodule. Although likely benign, if the patient is high-risk, given the morphology and/or location of this nodule a non-contrast chest CT can be considered in 12 months.This recommendation follows the consensus statement: Guidelines for Management of Incidental Pulmonary Nodules Detected on CT Images: From the Fleischner Society 2017; Radiology 2017; 284:228-243. 3. Mild dilatation of the main pulmonary artery suggesting pulmonary arterial hypertension. Aortic Atherosclerosis (ICD10-I70.0) and Emphysema (ICD10-J43.9). Electronically Signed   By: Keith Rake M.D.   On: 11/27/2021 03:07   DG Chest 2 View  Result Date: 11/26/2021 CLINICAL DATA:  Chest pain and shortness of breath. EXAM: CHEST - 2 VIEW COMPARISON:  05/19/2020, included portions from coronary CT 09/21/2018 FINDINGS: Chronic but increased peribronchial thickening. Stable lung volumes. Normal heart size with unchanged mediastinal contours. Minor bibasilar atelectasis or scarring. No pleural effusion or pneumothorax. No acute osseous abnormalities are seen. IMPRESSION: Chronic but increased peribronchial thickening suggesting acute bronchitis or asthma exacerbation. Electronically Signed   By: Keith Rake M.D.   On: 11/26/2021  21:08     Discharge Instructions: Discharge Instructions     Call MD for:  difficulty breathing, headache or visual disturbances   Complete by: As directed    Call MD for:  extreme fatigue   Complete by: As directed    Call MD for:  hives   Complete by: As directed    Call MD for:  persistant dizziness or light-headedness   Complete by: As directed    Call MD for:  persistant nausea and vomiting   Complete by: As directed    Call MD for:  severe uncontrolled pain   Complete by: As directed    Call MD for:  temperature >100.4   Complete by: As directed    Diet - low sodium heart healthy   Complete by: As directed    Increase activity slowly   Complete by: As directed        Signed: Johny Blamer, DO 11/29/2021, 4:06 PM   Pager: [phone]

## 2021-11-30 ENCOUNTER — Ambulatory Visit: Payer: Self-pay

## 2021-11-30 ENCOUNTER — Ambulatory Visit: Payer: Medicare Other

## 2021-11-30 LAB — CULTURE, BLOOD (SINGLE): Special Requests: ADEQUATE

## 2021-11-30 NOTE — Patient Outreach (Signed)
  Care Coordination TOC Note Transition Care Management Follow-up Telephone Call Date of discharge and from where: [facility] 11/26/21-11/29/21 How have you been since you were released from the hospital? "I am doing ok, my Yolanda Bonine is on the way to take me to the pharmacy." Any questions or concerns? No  Items Reviewed: Did the pt receive and understand the discharge instructions provided? Yes  Medications obtained and verified? Yes  Other? No  Any new allergies since your discharge? No  Dietary orders reviewed? Yes Do you have support at home? Yes   Home Care and Equipment/Supplies: Were home health services ordered? no If so, what is the name of the agency? N/A  Has the agency set up a time to come to the patient's home? no Were any new equipment or medical supplies ordered?  No What is the name of the medical supply agency? N/A Were you able to get the supplies/equipment? no Do you have any questions related to the use of the equipment or supplies? No  Functional Questionnaire: (I = Independent and D = Dependent) ADLs: I  Bathing/Dressing- I  Meal Prep- I  Eating- I  Maintaining continence- I  Transferring/Ambulation- I  Managing Meds- I  Follow up appointments reviewed:  PCP Hospital f/u appt confirmed? No  . [facility] f/u appt confirmed? No   Are transportation arrangements needed? No  If their condition worsens, is the pt aware to call PCP or go to the Emergency Dept.? Yes Was the patient provided with contact information for the PCP's office or ED? Yes Was to pt encouraged to call back with questions or concerns? Yes  SDOH assessments and interventions completed:   Yes  Care Coordination Interventions Activated:  Yes   Care Coordination Interventions:  PCP follow up appointment requested    Encounter Outcome:  Pt. Visit Completed

## 2021-11-30 NOTE — Progress Notes (Signed)
A single blood culture was positive for Staph epi during her hospitalization for COPD exacerbation. She remained afebrile and was not septic throughout her admission. Repeat blood cultures have no growth x 48 hours. I think this represents a contaminant, and no further action needed. Prior to discharge, patient was counseled on warning signs for when to seek medical attention.

## 2021-12-01 ENCOUNTER — Telehealth: Payer: Self-pay | Admitting: *Deleted

## 2021-12-01 ENCOUNTER — Telehealth: Payer: Self-pay | Admitting: Student

## 2021-12-01 NOTE — Telephone Encounter (Signed)
-----   Message from Johny Blamer, DO sent at 11/29/2021 12:08 PM EDT ----- Regarding: Hospital Follow up [facility]! She needs a hospital follow up this week. She expressed her frustration of switching PCPs every year so maybe we could put her will a categorical. Thank you!

## 2021-12-01 NOTE — Telephone Encounter (Signed)
Please refer to message below.  Just spoke with patient and she agreed to an appointment for this Thursday 12/03/2021 at 2:00 pm with Dr. Jodi Mourning.

## 2021-12-01 NOTE — Telephone Encounter (Signed)
Call from [facility] from [place] about prescription for Zithromax.  Consulted Dr. Cain Sieve patient to only receive 2 more tablets to complete dosing plan.  RTC to [facility] given instructions to dispense as written the 2 tablets.

## 2021-12-02 NOTE — Progress Notes (Unsigned)
CC: ED Follow-up  HPI:   [name] is a 66 y.o. female with a past medical history of morbid obesity, paroxysmal A-fib, asthma, COPD, and GERD who presents for follow-up of recent admission for acute respiratory failure.    Past Medical History:  Diagnosis Date   ANGIOEDEMA 12/16/2009   Asthma    Carpal tunnel syndrome 06/23/2011   Patient notes history of bilateral carpal tunnel syndrome.  Now has symptoms of right hand carpal tunnel.    COPD (chronic obstructive pulmonary disease) ([place])    secondary to tobacco use // No PFTs on file   COVID-19    ETOH abuse    Pt stopped in 3/08   Gastrointestinal hemorrhage    secondary to PUD on March 2008   OSA on CPAP    noncompliant with CPAP (2/2 it being depressing)   Peptic ulcer disease    +H. pylori (antigen)- Dr. Olevia Perches- treated   STRESS INCONTINENCE 01/24/2007   Tobacco abuse    quit in 2010   Urinary incontinence    Urticaria      Review of Systems:    Reports left breast rash Denies fever, chills, cough, shortness of breath, myalgias    Physical Exam:  Vitals:   12/03/21 1407  BP: 125/67  Pulse: (!) 57  Temp: 98.4 F (36.9 C)  TempSrc: Oral  SpO2: 98%  Weight: 266 lb 4.8 oz (120.8 kg)    General:   awake and alert, sitting comfortably in chair, cooperative, not in acute distress Skin:   warm and dry, intact without any obvious lesions or scars, small area of erythema about 4-5cm in length lateral to the areola, mild tenderness to palpation of left breast in that same area; no lesions, vesicles, nipple retraction, skin dimpling, or discharge Lungs:   normal respiratory effort, breathing unlabored, symmetrical chest rise, no crackles or wheezing Cardiac:   regular rate and rhythm, normal S1 and S2 Psychiatric:   mood and affect normal, intelligible speech    Assessment & Plan:   COPD (chronic obstructive pulmonary disease) (H[facility]) Patient was recently hospitalized and discharged on 8-13  for COPD exacerbation.  Reports complete resolution of her symptoms and denies any cough, fever, shortness of breath, palpitations, sweats, chills, malaise, dyspnea, myalgias, and chest tightness.  She completed her course of prednisone and ceftriaxone yesterday 8-16.  She was also prescribed azithromycin, but never picked up this medication and plans to do so today.  Patient was informed that there is no need to take azithromycin at this point.  There is no growth on blood cultures collected prior to discharge after 5 days of growth.  -Refer to pulmonary rehab for reconditioning   Rash Patient developed a bilateral breast rash at the same time as her upper respiratory symptoms.  This rash was accompanied by a very painful sensation and she felt like her whole chest was on fire.  Rash has improved since her discharge from the hospital on 8-13. She now reports just mild tenderness in the left breast.  On exam, there is some mild erythema on the lateral aspect of her left breast adjacent to the areola. Right breast has zero erythema is not tender to the touch.  -Continue to monitor and encourage patient to schedule screening mammogram within the next few weeks   Pulmonary nodule less than 6 mm determined by computed tomography of lung A small pulmonary nodule 50m in diameter was identified via CT abdomen during her recent hospitalization. Although likely  benign, this nodule warrants monitoring.  -Order non-contrast CT of the chest in August of 2024   Low iron Patient was found to have low serum iron and iron saturation ratio during her recent hospitalization.  Her hemoglobin levels were normal.  -Encourage patient to introduce more iron rich foods into her diet     See Encounters Tab for problem based charting.  Patient seen with Dr.  Cain Sieve

## 2021-12-03 ENCOUNTER — Ambulatory Visit (INDEPENDENT_AMBULATORY_CARE_PROVIDER_SITE_OTHER): Payer: Medicare Other | Admitting: Student

## 2021-12-03 VITALS — BP 125/67 | HR 57 | Temp 98.4°F | Wt 266.3 lb

## 2021-12-03 DIAGNOSIS — E611 Iron deficiency: Secondary | ICD-10-CM

## 2021-12-03 DIAGNOSIS — R21 Rash and other nonspecific skin eruption: Secondary | ICD-10-CM

## 2021-12-03 DIAGNOSIS — R911 Solitary pulmonary nodule: Secondary | ICD-10-CM | POA: Insufficient documentation

## 2021-12-03 DIAGNOSIS — J449 Chronic obstructive pulmonary disease, unspecified: Secondary | ICD-10-CM

## 2021-12-03 DIAGNOSIS — D649 Anemia, unspecified: Secondary | ICD-10-CM

## 2021-12-03 DIAGNOSIS — J439 Emphysema, unspecified: Secondary | ICD-10-CM

## 2021-12-03 HISTORY — DX: Iron deficiency: E61.1

## 2021-12-03 HISTORY — DX: Solitary pulmonary nodule: R91.1

## 2021-12-03 LAB — CULTURE, BLOOD (SINGLE)
Culture: NO GROWTH
Special Requests: ADEQUATE

## 2021-12-03 NOTE — Assessment & Plan Note (Signed)
Patient developed a bilateral breast rash at the same time as her upper respiratory symptoms.  This rash was accompanied by a very painful sensation and she felt like her whole chest was on fire.  Rash has improved since her discharge from the hospital on 8-13. She now reports just mild tenderness in the left breast.  On exam, there is some mild erythema on the lateral aspect of her left breast adjacent to the areola. Right breast has zero erythema is not tender to the touch.  -Continue to monitor and encourage patient to schedule screening mammogram within the next few weeks

## 2021-12-03 NOTE — Assessment & Plan Note (Signed)
>>  ASSESSMENT AND PLAN FOR COPD (CHRONIC OBSTRUCTIVE PULMONARY DISEASE) ([place]) WRITTEN ON 12/03/2021  5:09 PM BY HARPER, DANIEL, MD  Patient was recently hospitalized and discharged on 8-13 for COPD exacerbation.  Reports complete resolution of her symptoms and denies any cough, fever, shortness of breath, palpitations, sweats, chills, malaise, dyspnea, myalgias, and chest tightness.  She completed her course of prednisone and ceftriaxone yesterday 8-16.  She was also prescribed azithromycin, but never picked up this medication and plans to do so today.  Patient was informed that there is no need to take azithromycin at this point.  There is no growth on blood cultures collected prior to discharge after 5 days of growth.  -Refer to pulmonary rehab for reconditioning

## 2021-12-03 NOTE — Assessment & Plan Note (Signed)
Patient was recently hospitalized and discharged on 8-13 for COPD exacerbation.  Reports complete resolution of her symptoms and denies any cough, fever, shortness of breath, palpitations, sweats, chills, malaise, dyspnea, myalgias, and chest tightness.  She completed her course of prednisone and ceftriaxone yesterday 8-16.  She was also prescribed azithromycin, but never picked up this medication and plans to do so today.  Patient was informed that there is no need to take azithromycin at this point.  There is no growth on blood cultures collected prior to discharge after 5 days of growth.  -Refer to pulmonary rehab for reconditioning

## 2021-12-03 NOTE — Patient Instructions (Signed)
  Thank you, Ms.Alaija Jabier Mutton, for allowing [place] to provide your care today. Today we discussed . . .  > COPD       - it is great that you are feeling better since your hospitalization, you do not need to pick up the azithromycin       - we have placed a referral for pulmonary rehab, which should improve your lung function       - remember to have another CT scan of your lungs in August 2024 to monitor that small nodule > Breast Rash       - your rash has improved significantly, but let us know if it does not continue to improve       - schedule your mammography within the next few months > Iron Deficiency       - try to eat more iron-rich foods to increase your iron levels, examples include:  lentils, chickpeas, beans, tofu, cashew nuts, chia seeds, ground linseed, hemp seeds, pumpkin seeds, kale, dried apricots and figs, raisins, quinoa, fortified breakfast cereal, and dark leafy greens like dandelion, collard, kale and spinach   I have ordered the following labs for you:  Lab Orders  No laboratory test(s) ordered today      Tests ordered today:  None   Referrals ordered today:   Referral Orders         Ambulatory referral to Pulmonology        I have ordered the following medication/changed the following medications:   Stop the following medications: There are no discontinued medications.   Start the following medications: No orders of the defined types were placed in this encounter.     Follow up: 3 months    Remember:  Monitor your breast rash, introduce more iron-rich foods into your diet, and definitely try pulmonary rehab. We will see you back in clinic in about three months!   Should you have any questions or concerns please call the internal medicine clinic at [phone].     Roswell Nickel, MD [place]

## 2021-12-03 NOTE — Assessment & Plan Note (Signed)
Patient was found to have low serum iron and iron saturation ratio during her recent hospitalization.  Her hemoglobin levels were normal.  -Encourage patient to introduce more iron rich foods into her diet

## 2021-12-03 NOTE — Assessment & Plan Note (Signed)
A small pulmonary nodule 32m in diameter was identified via CT abdomen during her recent hospitalization. Although likely benign, this nodule warrants monitoring.  -Order non-contrast CT of the chest in August of 2024

## 2021-12-04 NOTE — Progress Notes (Signed)
Internal Medicine Clinic Attending  I saw and evaluated the patient.  I personally confirmed the key portions of the history and exam documented by Dr. Jodi Mourning and I reviewed pertinent patient test results.  The assessment, diagnosis, and plan were formulated together and I agree with the documentation in the resident's note.    Patient here for post hospital visit after COPD exacerbation, and is feeling back to her baseline. Continue LAMA/LABA/ICS, and refer for pulm rehab

## 2021-12-09 ENCOUNTER — Ambulatory Visit (INDEPENDENT_AMBULATORY_CARE_PROVIDER_SITE_OTHER): Payer: Medicare Other

## 2021-12-09 DIAGNOSIS — J4550 Severe persistent asthma, uncomplicated: Secondary | ICD-10-CM

## 2021-12-09 MED ORDER — BENRALIZUMAB 30 MG/ML ~~LOC~~ SOSY
30.0000 mg | PREFILLED_SYRINGE | SUBCUTANEOUS | Status: DC
  Administered 2021-12-09 – 2022-03-31 (×3): 30 mg via SUBCUTANEOUS

## 2021-12-16 ENCOUNTER — Other Ambulatory Visit: Payer: Self-pay | Admitting: Anesthesiology

## 2021-12-16 DIAGNOSIS — Z1231 Encounter for screening mammogram for malignant neoplasm of breast: Secondary | ICD-10-CM

## 2021-12-28 ENCOUNTER — Ambulatory Visit (INDEPENDENT_AMBULATORY_CARE_PROVIDER_SITE_OTHER): Payer: Medicare Other | Admitting: Internal Medicine

## 2021-12-28 ENCOUNTER — Encounter: Payer: Self-pay | Admitting: Internal Medicine

## 2021-12-28 VITALS — BP 146/80 | HR 65 | Temp 99.0°F | Ht 60.0 in | Wt 265.2 lb

## 2021-12-28 DIAGNOSIS — G4733 Obstructive sleep apnea (adult) (pediatric): Secondary | ICD-10-CM

## 2021-12-28 DIAGNOSIS — J4550 Severe persistent asthma, uncomplicated: Secondary | ICD-10-CM | POA: Diagnosis not present

## 2021-12-28 DIAGNOSIS — D7219 Other eosinophilia: Secondary | ICD-10-CM | POA: Diagnosis not present

## 2021-12-28 DIAGNOSIS — J449 Chronic obstructive pulmonary disease, unspecified: Secondary | ICD-10-CM

## 2021-12-28 DIAGNOSIS — E669 Obesity, unspecified: Secondary | ICD-10-CM

## 2021-12-28 DIAGNOSIS — E662 Morbid (severe) obesity with alveolar hypoventilation: Secondary | ICD-10-CM

## 2021-12-28 NOTE — Patient Instructions (Addendum)
Please schedule follow up scheduled with myself in 6 months.  If my schedule is not open yet, we will contact you with a reminder closer to that time. Please call [phone] if you haven't heard from [place] a month before.   Continue your asthma therapy with breztri and albuterol as needed.  Follow up with Dr. Simona Huh about starting fasenra - I think that will probably help your breathing the most.

## 2021-12-28 NOTE — Progress Notes (Signed)
[name]    [id number]    November 27, 1955  Primary Care Physician:Lau, Shirlee Limerick, MD  Referring Physician: Clemon Chambers, MD [address] [place],  [place] [id number] Reason for Consultation: shortness of breath Date of Consultation: 12/28/2021  Chief complaint:   Chief Complaint  Patient presents with   Consult    SOB/ fatigue      HPI: Maria Nelson is a 66 y.o. woman with history of tobacco use disorder and asthma who presents for new patient evalution.   She is about to start fasenra for asthma.   Symptoms been going on for about a year, progressing. Dyspnea with minimal exertion, not accompanied by chest tightness and wheezing. She says she sleeps ok.   She feels most of her symptoms are worsened with exertion and with certain triggers such as when air quality is bad.   She says she has known sleep apnea and has not been able to tolerate CPAP in the past.   Current Regimen: breztri 2 puffs with prn addition of fluticasone Asthma Triggers: exertion, URIs Exacerbations in the last year: one History of hospitalization or intubation: yes last in July 2023 Allergy Testing: yes GERD: yes on PPI once daily Allergic Rhinitis: on cetirizine, astelin prn ACT:  Asthma Control Test ACT Total Score  06/19/2021 12:00 PM 16  08/10/2019  1:00 PM 13  02/27/2018 10:00 AM 22   FeNO:  OBSTRUCTIVE SLEEP APNEA SCREENING  1.  Snoring?:  Yes 2.  Tired?:  Yes 3.  Observed apnea, stop breathing or choking/gasping during sleep?:  no 4.  Pressure. HTN history?  no 5.  BMI more than 35 kg/m2?  yes 6.  Age more than 40 yrs?  yes 7.  Neck size larger than 17 in for female or 16 in for female?  yes 8.  Gender = Female?  no  Total:  5  For general population  OSA - Low Risk : Yes to 0 - 2 questions OSA - Intermediate Risk : Yes to 3 - 4 questions OSA - High Risk : Yes to 5 - 8 questions  or Yes to 2 or more of 4 STOP questions + female gender or  Yes to 2 or more of 4 STOP questions + BMI > 35kg/m2  or Yes to 2 or more of 4 STOP questions + neck circumference 17 inches / 43cm in female or 16 inches / 41cm in female  References: Rinaldo Cloud al. Anesthesiology 2008; 108: 812-821,  Gabriel Cirri et al Br Dara Hoyer 2012; 108: 629-528,  Gabriel Cirri et al J Clin Sleep Med Sept 2014.  Social history:  Occupation: on disability, previously worked in [employer].  Exposures: lives at home independently Smoking history: minimum 20 pack years quit 2012.   Social History   Occupational History   Occupation: Disability    Comment: previously worked in [employer] had to stop 2/2 occupational exposures leading to exacerbations of asthma   Tobacco Use   Smoking status: Former    Packs/day: 0.50    Years: 40.00    Total pack years: 20.00    Types: Cigarettes    Quit date: 12/04/2010    Years since quitting: 11.0   Smokeless tobacco: Never  Vaping Use   Vaping Use: Never used  Substance and Sexual Activity   Alcohol use: Not Currently    Alcohol/week: 2.0 standard drinks of alcohol    Types: 1  Glasses of wine, 1 Cans of beer per week    Comment: 1-2 glasses per week, beer 1 per week   Drug use: No   Sexual activity: Not Currently    Relevant family history:  Family History  Problem Relation Age of Onset   Diabetes Mother    Coronary artery disease Mother 42       requiring 5 vessel CABG   Diabetes Sister    Breast cancer Sister 75       died 2/2 breast ca age 72-60    Past Medical History:  Diagnosis Date   ANGIOEDEMA 12/16/2009   Asthma    Carpal tunnel syndrome 06/23/2011   Patient notes history of bilateral carpal tunnel syndrome.  Now has symptoms of right hand carpal tunnel.    COPD (chronic obstructive pulmonary disease) ([place])    secondary to tobacco use // No PFTs on file   COVID-19    ETOH abuse    Pt stopped in 3/08   Gastrointestinal hemorrhage    secondary to PUD on March 2008   OSA on CPAP    noncompliant with CPAP (2/2  it being depressing)   Peptic ulcer disease    +H. pylori (antigen)- Dr. Olevia Perches- treated   STRESS INCONTINENCE 01/24/2007   Tobacco abuse    quit in 2010   Urinary incontinence    Urticaria     Past Surgical History:  Procedure Laterality Date   BREAST EXCISIONAL BIOPSY Right 1994   MANDIBLE SURGERY     due to trauma   TUBAL LIGATION       Physical Exam: Blood pressure (!) 146/80, pulse 65, temperature 99 F (37.2 C), temperature source Oral, height 5' (1.524 m), weight 265 lb 3.2 oz (120.3 kg), last menstrual period 04/19/2004, SpO2 96 %. Gen:      No acute distress, obese ENT:  no nasal polyps, mucus membranes moist Lungs:    No increased respiratory effort, symmetric chest wall excursion, clear to auscultation bilaterally, no wheezes or crackles CV:         Regular rate and rhythm; no murmurs, rubs, or gallops.  No pedal edema Abd:      + bowel sounds; soft, non-tender; no distension MSK: no acute synovitis of DIP or PIP joints, no mechanics hands.  Skin:      Warm and dry; no rashes Neuro: normal speech, no focal facial asymmetry Psych: alert and oriented x3, normal mood and affect   Data Reviewed/Medical Decision Making:  Independent interpretation of tests: Imaging:  Review of patient's CT Chest images revealed emphysema, 71m nodule in the RML. The patient's images have been independently reviewed by me.    PFTs: I have personally reviewed the patient's PFTs and spirometry July 2023 - Moderately severe airflow limitation FEV1 54% of predicted.      Latest Ref Rng & Units 03/11/2014   12:16 PM  PFT Results  FVC-Pre L 1.50   FVC-Predicted Pre % 66   FVC-Post L 1.75   FVC-Predicted Post % 77   Pre FEV1/FVC % % 55   Post FEV1/FCV % % 51   FEV1-Pre L 0.82   FEV1-Predicted Pre % 46   FEV1-Post L 0.90   DLCO uncorrected ml/min/mmHg 7.57   DLCO UNC% % 40   DLCO corrected ml/min/mmHg 7.57   DLCO COR %Predicted % 40   DLVA Predicted % 79   TLC L 3.88   TLC %  Predicted % 87   RV % Predicted %  125     Labs: AEC 500  Immunization status:  Immunization History  Administered Date(s) Administered   Fluad Quad(high Dose 65+) 01/28/2021   Influenza Whole 01/24/2007, 06/06/2009, 12/24/2009, 12/25/2010   Influenza,inj,Quad PF,6+ Mos 02/14/2013, 03/05/2014, 02/11/2016, 05/10/2018, 12/05/2018   PFIZER(Purple Top)SARS-COV-2 Vaccination 08/21/2019   PNEUMOCOCCAL CONJUGATE-20 07/13/2021   Pneumococcal Polysaccharide-23 06/06/2009   Tdap 06/23/2011     I reviewed prior external note(s) from allergy, hospital  I reviewed the result(s) of the labs and imaging as noted above.   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 With component of deconditioning, untreated OSA.   Continue Breztri 2 puffs twice a day, gargle after use.  Continue albuterol rescue inhaler.   Ambulatory desat study today - she did not drop below 90%. She would need to drop below 88% to require home oxygen therapy. She is currently refusing any further treatment for sleep apnea and says that even if she needed home oxygen she wouldn't wear it due to concerns for how it would affect her electricity bill.   Next due for CT scan in August 2024 for lung cancer screening.   We discussed disease management and progression at length today.   Return to Care: Return in about 6 months (around 06/28/2022).  NLenice Llamas MD [facility] CC: DClemon Chambers MD

## 2022-01-01 ENCOUNTER — Ambulatory Visit
Admission: RE | Admit: 2022-01-01 | Discharge: 2022-01-01 | Disposition: A | Discharge status: Home / Self Care | Payer: Medicare Other | Source: Ambulatory Visit | Attending: Anesthesiology | Admitting: Anesthesiology

## 2022-01-01 DIAGNOSIS — Z1231 Encounter for screening mammogram for malignant neoplasm of breast: Secondary | ICD-10-CM | POA: Diagnosis not present

## 2022-01-18 ENCOUNTER — Other Ambulatory Visit: Payer: Self-pay

## 2022-01-18 MED ORDER — PANTOPRAZOLE SODIUM 40 MG PO TBEC
40.0000 mg | DELAYED_RELEASE_TABLET | Freq: Every day | ORAL | 1 refills | Status: DC
Start: 2022-01-18 — End: 2022-06-23

## 2022-02-03 ENCOUNTER — Ambulatory Visit (INDEPENDENT_AMBULATORY_CARE_PROVIDER_SITE_OTHER): Payer: Medicare Other | Admitting: *Deleted

## 2022-02-03 DIAGNOSIS — J4550 Severe persistent asthma, uncomplicated: Secondary | ICD-10-CM

## 2022-03-01 ENCOUNTER — Other Ambulatory Visit: Payer: Self-pay | Admitting: Internal Medicine

## 2022-03-15 ENCOUNTER — Other Ambulatory Visit: Payer: Self-pay

## 2022-03-15 ENCOUNTER — Ambulatory Visit (INDEPENDENT_AMBULATORY_CARE_PROVIDER_SITE_OTHER): Payer: Medicare Other | Admitting: Internal Medicine

## 2022-03-15 ENCOUNTER — Encounter: Payer: Self-pay | Admitting: Internal Medicine

## 2022-03-15 VITALS — BP 144/86 | HR 56 | Temp 97.6°F | Ht 60.0 in | Wt 264.5 lb

## 2022-03-15 DIAGNOSIS — Z8711 Personal history of peptic ulcer disease: Secondary | ICD-10-CM

## 2022-03-15 DIAGNOSIS — M25562 Pain in left knee: Secondary | ICD-10-CM | POA: Diagnosis not present

## 2022-03-15 DIAGNOSIS — G8929 Other chronic pain: Secondary | ICD-10-CM

## 2022-03-15 DIAGNOSIS — M545 Low back pain, unspecified: Secondary | ICD-10-CM

## 2022-03-15 DIAGNOSIS — Z6841 Body Mass Index (BMI) 40.0 and over, adult: Secondary | ICD-10-CM

## 2022-03-15 DIAGNOSIS — N1831 Chronic kidney disease, stage 3a: Secondary | ICD-10-CM

## 2022-03-15 DIAGNOSIS — M25571 Pain in right ankle and joints of right foot: Secondary | ICD-10-CM | POA: Diagnosis not present

## 2022-03-15 DIAGNOSIS — M25561 Pain in right knee: Secondary | ICD-10-CM

## 2022-03-15 DIAGNOSIS — Z23 Encounter for immunization: Secondary | ICD-10-CM

## 2022-03-15 DIAGNOSIS — R030 Elevated blood-pressure reading, without diagnosis of hypertension: Secondary | ICD-10-CM

## 2022-03-15 DIAGNOSIS — M25572 Pain in left ankle and joints of left foot: Secondary | ICD-10-CM

## 2022-03-15 DIAGNOSIS — G4733 Obstructive sleep apnea (adult) (pediatric): Secondary | ICD-10-CM

## 2022-03-15 DIAGNOSIS — Z1211 Encounter for screening for malignant neoplasm of colon: Secondary | ICD-10-CM

## 2022-03-15 DIAGNOSIS — J3089 Other allergic rhinitis: Secondary | ICD-10-CM

## 2022-03-15 DIAGNOSIS — E785 Hyperlipidemia, unspecified: Secondary | ICD-10-CM | POA: Diagnosis not present

## 2022-03-15 MED ORDER — CETIRIZINE HCL 10 MG PO TABS: 10.0000 mg | ORAL_TABLET | Freq: Every day | ORAL | 3 refills | Status: DC

## 2022-03-15 NOTE — Assessment & Plan Note (Addendum)
Chronic left lower back pain worsened at night with lying on the left side. Pain resolves with rolling to her right side. She does not currently have pain in clinic. She also has some pain with palpation over her left, lateral hip--possible that there is a component of hip arthritis or bursitis contributing. We have discussed conservative management with heat and lidocaine patches for intermittent musculoskeletal back pain. Referral also placed to PT.

## 2022-03-15 NOTE — Assessment & Plan Note (Signed)
Weight stable. Ms. Rawl reports she eats plenty of vegetables and beans. She does drink sweet tea and we discussed limiting this, as well as other sweetened foods. She reports she has not been walking as much as she should. She is agreeable to referral to [place].[facility] exercise program. She declines referral to RD today.  Plan -P.R.E.P referral

## 2022-03-15 NOTE — Assessment & Plan Note (Signed)
Phone number provided to reschedule CSY.

## 2022-03-15 NOTE — Assessment & Plan Note (Signed)
Last lipid panel 2021, ASCVD risk 8.3% at that time. Will recheck lipid panel today, not currently on statin therapy.

## 2022-03-15 NOTE — Assessment & Plan Note (Signed)
Creatinine elevated as far back as 2016. Recent eGFR 11/2021 in CKD3a category although notably she was hospitalized during this time. Prior eGFR 52 during clinic visit 06/2021, raising concern for true CKD. We will recheck BMP today, add ACR.  Plan -BMP, ACR

## 2022-03-15 NOTE — Progress Notes (Signed)
Established Patient Office Visit  Subjective   Patient ID: Maria Nelson, female    DOB: 1956-02-17  Age: 66 y.o. MRN: [id number]  Chief Complaint  Patient presents with   Meet nee PCP    Knees and back pain.    [name] is a 66 yo in clinic today for follow-up of chronic conditions and first visit with me. She would also like to discuss chronic bilateral knee and ankle pain, as well as left lower back pain. Please see assessment/plan in problem-based charting for further details of today's visit.     Patient Active Problem List   Diagnosis Date Noted   Stage 3a chronic kidney disease ([place]) 03/15/2022   Pulmonary nodule less than 6 mm determined by computed tomography of lung 12/03/2021   Low iron 12/03/2021   Benign paroxysmal positional vertigo 09/03/2021   Colon cancer screening 07/14/2021   Healthcare maintenance 01/28/2021   Hyperlipidemia 11/22/2019   Adnexal pain 09/06/2018   Rash 05/11/2018   Paroxysmal atrial fibrillation ([place]) 08/24/2017   Dyspepsia 08/24/2017   Chronic pain of both knees 08/16/2016   Back pain 06/23/2016   Allergic rhinitis with nonallergic component 03/04/2015   Asthma with COPD 06/19/2014   Blood pressure elevated without history of HTN 03/05/2014   HNP (herniated nucleus pulposus), lumbar 04/16/2013   Tenosynovitis, de Quervain 11/15/2012   Morbid obesity with BMI of 45.0-49.9, adult ([place]) 09/08/2012   Carpal tunnel syndrome 06/23/2011   Shoulder pain 09/02/2010   Sleep apnea 09/02/2010   GERD 12/24/2009   Urticaria/angioedema 12/16/2009   STRESS INCONTINENCE 01/24/2007   History of peptic ulcer disease 07/26/2006   Review of Systems  Musculoskeletal:  Positive for back pain and joint pain.      Objective:     BP (!) 144/86 (BP Location: Left Arm, Patient Position: Sitting, Cuff Size: Large)   Pulse (!) 56   Temp 97.6 F (36.4 C) (Oral)   Ht 5' (1.524 m)   Wt 264 lb 8 oz (120 kg)   LMP 04/19/2004  (Approximate)   SpO2 95% Comment: RA  BMI 51.66 kg/m  BP Readings from Last 3 Encounters:  03/15/22 (!) 144/86  12/28/21 (!) 146/80  12/03/21 125/67   Wt Readings from Last 3 Encounters:  03/15/22 264 lb 8 oz (120 kg)  12/28/21 265 lb 3.2 oz (120.3 kg)  12/03/21 266 lb 4.8 oz (120.8 kg)   Physical Exam Constitutional:      General: She is not in acute distress.    Appearance: Normal appearance. She is obese.  Musculoskeletal:        General: Tenderness (left paraspinal muscle, left lateral hip) present. No swelling or deformity.  Neurological:     Mental Status: She is alert.      Assessment & Plan:   Problem List Items Addressed This Visit       Respiratory   Sleep apnea    Difficulty with using CPAP mask due to discomfort. I encouraged [name] to reach out to her DME company to inquire about alternative options such as nasal pillows.      Allergic rhinitis with nonallergic component    Refill cetirizine.        Genitourinary   Stage 3a chronic kidney disease ([place])    Creatinine elevated as far back as 2016. Recent eGFR 11/2021 in CKD3a category although notably she was hospitalized during this time. Prior eGFR 52 during clinic visit 06/2021, raising concern for true CKD. We will recheck  BMP today, add ACR.  Plan -BMP, ACR      Relevant Orders   BMP8+Anion Gap   Microalbumin / Creatinine Urine Ratio     Other   Morbid obesity with BMI of 45.0-49.9, adult ([facility]) (Chronic)    Weight stable. [name] reports she eats plenty of vegetables and beans. She does drink sweet tea and we discussed limiting this, as well as other sweetened foods. She reports she has not been walking as much as she should. She is agreeable to referral to [place].[facility] exercise program. She declines referral to RD today.  Plan -P.R.E.P referral      Relevant Orders   Amb Referral To Provider Referral Exercise Program (P.R.E.P)   History of peptic ulcer disease    Note chart  history of PUD with hemorrhage in 2008. I am not able to find further information regarding this. [name] remains on PPI therapy.      Blood pressure elevated without history of HTN    BP elevated during today's clinic visit. Previous clinic pressures largely well controlled without medication. We discussed home monitoring with return in one month. She states her nurse who is a CNA can help her with this. She seemed resistant to the idea of adding medication of hypertension, however we reviewed the risks of untreated high blood pressure and will continue to address if BP remains elevated at f/u.      Back pain    Chronic left lower back pain worsened at night with lying on the left side. Pain resolves with rolling to her right side. She does not currently have pain in clinic. She also has some pain with palpation over her left, lateral hip--possible that there is a component of hip arthritis or bursitis contributing. We have discussed conservative management with heat and lidocaine patches for intermittent musculoskeletal back pain. Referral also placed to PT.      Relevant Orders   Ambulatory referral to Physical Therapy   Chronic pain of both knees - Primary    Chronic, bilateral knee pain worsened at the end of the day after activity. No effusion or limited ROM on today's exam. Suspect osteoarthritis. She has been rubbing WD-40 on her knees and ankles with improvement in pain. We discussed stopping this and trying diclofenac gel and/or acetaminophen. Referral also placed to physical therapy. If pain persists, we could discuss steroid injections.      Relevant Orders   Ambulatory referral to Physical Therapy   Hyperlipidemia    Last lipid panel 2021, ASCVD risk 8.3% at that time. Will recheck lipid panel today, not currently on statin therapy.      Relevant Orders   Lipid Profile   Colon cancer screening    Phone number provided to reschedule CSY.      Other Visit Diagnoses      Chronic pain of both ankles       Relevant Orders   Ambulatory referral to Physical Therapy   Need for immunization against influenza       Relevant Orders   Flu Vaccine QUAD High Dose(Fluad) (Completed)       Return in about 4 weeks (around 04/12/2022) for fu BP.    Charise Killian, MD

## 2022-03-15 NOTE — Assessment & Plan Note (Signed)
Note chart history of PUD with hemorrhage in 2008. I am not able to find further information regarding this. Maria Nelson remains on PPI therapy.

## 2022-03-15 NOTE — Assessment & Plan Note (Signed)
Difficulty with using CPAP mask due to discomfort. I encouraged Maria Nelson to reach out to her DME company to inquire about alternative options such as nasal pillows.

## 2022-03-15 NOTE — Assessment & Plan Note (Signed)
Chronic, bilateral knee pain worsened at the end of the day after activity. No effusion or limited ROM on today's exam. Suspect osteoarthritis. She has been rubbing WD-40 on her knees and ankles with improvement in pain. We discussed stopping this and trying diclofenac gel and/or acetaminophen. Referral also placed to physical therapy. If pain persists, we could discuss steroid injections.

## 2022-03-15 NOTE — Assessment & Plan Note (Signed)
BP elevated during today's clinic visit. Previous clinic pressures largely well controlled without medication. We discussed home monitoring with return in one month. She states her nurse who is a CNA can help her with this. She seemed resistant to the idea of adding medication of hypertension, however we reviewed the risks of untreated high blood pressure and will continue to address if BP remains elevated at f/u.

## 2022-03-15 NOTE — Patient Instructions (Addendum)
For your knee and ankle pain, you may try diclofenac (Voltaren) gel three to four times daily. You may use lidocaine patch and heat pad/pack for your back pain. Continue taking the acetaminophen at night if needed. I have also sent a referral for physical therapy.  Please call to reschedule your colonoscopy. [phone].  Please contact your pharmacy about scheduling the shingles (Shingrix) and updated COVID-19 vaccines.

## 2022-03-15 NOTE — Assessment & Plan Note (Signed)
Refill cetirizine.

## 2022-03-16 LAB — BMP8+ANION GAP
Anion Gap: 14 mmol/L (ref 10.0–18.0)
BUN/Creatinine Ratio: 12 (ref 12–28)
BUN: 13 mg/dL (ref 8–27)
CO2: 24 mmol/L (ref 20–29)
Calcium: 10.1 mg/dL (ref 8.7–10.3)
Chloride: 104 mmol/L (ref 96–106)
Creatinine, Ser: 1.05 mg/dL — ABNORMAL HIGH (ref 0.57–1.00)
Glucose: 87 mg/dL (ref 70–99)
Potassium: 4.1 mmol/L (ref 3.5–5.2)
Sodium: 142 mmol/L (ref 134–144)
eGFR: 59 mL/min/{1.73_m2} — ABNORMAL LOW (ref >59)

## 2022-03-16 LAB — LIPID PANEL
Chol/HDL Ratio: 3.3 ratio (ref 0.0–4.4)
Cholesterol, Total: 200 mg/dL — ABNORMAL HIGH (ref 100–199)
HDL: 60 mg/dL (ref >39)
LDL Chol Calc (NIH): 125 mg/dL — ABNORMAL HIGH (ref 0–99)
Triglycerides: 82 mg/dL (ref 0–149)
VLDL Cholesterol Cal: 15 mg/dL (ref 5–40)

## 2022-03-16 LAB — MICROALBUMIN / CREATININE URINE RATIO
Creatinine, Urine: 78.3 mg/dL
Microalb/Creat Ratio: <4 mg/g creat (ref 0–29)
Microalbumin, Urine: <3 ug/mL

## 2022-03-17 NOTE — Progress Notes (Signed)
Kidney function consistent with CKD3a, discussed with Ms. Fellenz 11/29. uACR <30 mg/g. We discussed low salt diet which may also help with elevated BP. ASCVD risk 11%. Ms. Neault would like to trial behavioral interventions with exercise and nutrition prior to starting statin therapy.

## 2022-03-19 ENCOUNTER — Other Ambulatory Visit: Payer: Self-pay | Admitting: Family

## 2022-03-29 ENCOUNTER — Other Ambulatory Visit: Payer: Self-pay

## 2022-03-29 ENCOUNTER — Ambulatory Visit: Payer: Medicare Other | Attending: Internal Medicine

## 2022-03-29 DIAGNOSIS — M5459 Other low back pain: Secondary | ICD-10-CM | POA: Diagnosis not present

## 2022-03-29 DIAGNOSIS — M6281 Muscle weakness (generalized): Secondary | ICD-10-CM | POA: Insufficient documentation

## 2022-03-29 DIAGNOSIS — M25572 Pain in left ankle and joints of left foot: Secondary | ICD-10-CM | POA: Insufficient documentation

## 2022-03-29 DIAGNOSIS — M25561 Pain in right knee: Secondary | ICD-10-CM | POA: Diagnosis not present

## 2022-03-29 DIAGNOSIS — G8929 Other chronic pain: Secondary | ICD-10-CM | POA: Diagnosis not present

## 2022-03-29 DIAGNOSIS — M25562 Pain in left knee: Secondary | ICD-10-CM | POA: Insufficient documentation

## 2022-03-29 DIAGNOSIS — M545 Low back pain, unspecified: Secondary | ICD-10-CM | POA: Insufficient documentation

## 2022-03-29 DIAGNOSIS — M25571 Pain in right ankle and joints of right foot: Secondary | ICD-10-CM | POA: Insufficient documentation

## 2022-03-29 NOTE — Therapy (Signed)
OUTPATIENT PHYSICAL THERAPY THORACOLUMBAR EVALUATION   Patient Name: Maria Nelson MRN: [id number] DOB:03/03/1956, 66 y.o., female Today's Date: 03/29/2022  END OF SESSION:  PT End of Session - 03/29/22 1407     Visit Number 1    Number of Visits 12    Date for PT Re-Evaluation 05/24/22    Authorization Type U[employer]    PT Start Time 1400    PT Stop Time 1445    PT Time Calculation (min) 45 min    Activity Tolerance Patient tolerated treatment well    Behavior During Therapy [facility] for tasks assessed/performed             Past Medical History:  Diagnosis Date   ANGIOEDEMA 12/16/2009   Asthma    Carpal tunnel syndrome 06/23/2011   Patient notes history of bilateral carpal tunnel syndrome.  Now has symptoms of right hand carpal tunnel.    COPD (chronic obstructive pulmonary disease) ([place])    secondary to tobacco use // No PFTs on file   COVID-19    ETOH abuse    Pt stopped in 3/08   Gastrointestinal hemorrhage    secondary to PUD on March 2008   OSA on CPAP    noncompliant with CPAP (2/2 it being depressing)   Peptic ulcer disease    +H. pylori (antigen)- Dr. Olevia Perches- treated   STRESS INCONTINENCE 01/24/2007   Tobacco abuse    quit in 2010   Urinary incontinence    Urticaria    Urticaria 07/01/2015   Past Surgical History:  Procedure Laterality Date   BREAST EXCISIONAL BIOPSY Right 1994   MANDIBLE SURGERY     due to trauma   TUBAL LIGATION     Patient Active Problem List   Diagnosis Date Noted   Stage 3a chronic kidney disease ([place]) 03/15/2022   Pulmonary nodule less than 6 mm determined by computed tomography of lung 12/03/2021   Low iron 12/03/2021   Benign paroxysmal positional vertigo 09/03/2021   Colon cancer screening 07/14/2021   Healthcare maintenance 01/28/2021   Hyperlipidemia 11/22/2019   Adnexal pain 09/06/2018   Rash 05/11/2018   Paroxysmal atrial fibrillation ([place]) 08/24/2017   Dyspepsia 08/24/2017   Chronic pain of both knees  08/16/2016   Back pain 06/23/2016   Allergic rhinitis with nonallergic component 03/04/2015   Asthma with COPD 06/19/2014   Blood pressure elevated without history of HTN 03/05/2014   HNP (herniated nucleus pulposus), lumbar 04/16/2013   Tenosynovitis, de Quervain 11/15/2012   Morbid obesity with BMI of 45.0-49.9, adult ([place]) 09/08/2012   Carpal tunnel syndrome 06/23/2011   Shoulder pain 09/02/2010   Sleep apnea 09/02/2010   GERD 12/24/2009   Urticaria/angioedema 12/16/2009   STRESS INCONTINENCE 01/24/2007   History of peptic ulcer disease 07/26/2006    PCP: Charise Killian, MD   REFERRING PROVIDER: Charise Killian, MD   REFERRING DIAG: M25.561,M25.562,G89.29 (ICD-10-CM) - Chronic pain of both knees M25.571,G89.29,M25.572 (ICD-10-CM) - Chronic pain of both ankles M54.50,G89.29 (ICD-10-CM) - Chronic left-sided low back pain without sciatica  Rationale for Evaluation and Treatment: Rehabilitation  THERAPY DIAG: chronic low back pain  ONSET DATE: chronic  SUBJECTIVE:  SUBJECTIVE STATEMENT: Chronic pain of both knees - Primary     Chronic, bilateral knee pain worsened at the end of the day after activity. No effusion or limited ROM on today's exam. Suspect osteoarthritis. She has been rubbing WD-40 on her knees and ankles with improvement in pain. We discussed stopping this and trying diclofenac gel and/or acetaminophen. Referral also placed to physical therapy. If pain persists, we could discuss steroid injections.    PERTINENT HISTORY:  Ms. Brummitt is a 66 yo in clinic today for follow-up of chronic conditions and first visit with me. She would also like to discuss chronic bilateral knee and ankle pain, as well as left lower back pain. Please see assessment/plan in problem-based charting for further  details of today's visit.  PAIN:  Are you having pain? Yes: NPRS scale: 10/10 Pain location: L hip Pain description: ache Aggravating factors: lying on involved side Relieving factors: position changes  PRECAUTIONS: None  WEIGHT BEARING RESTRICTIONS: No  FALLS:  Has patient fallen in last 6 months? No  LIVING ENVIRONMENT: Lives with: lives with their family  OCCUPATION: not working  PLOF: Independent  PATIENT GOALS: To reduce and manage my symptoms  NEXT MD VISIT:   OBJECTIVE:   DIAGNOSTIC FINDINGS:  None available  PATIENT SURVEYS:  FOTO 54(66 predicted)  SCREENING FOR RED FLAGS: negative COGNITION: Overall cognitive status: Within functional limits for tasks assessed     SENSATION: Not tested  MUSCLE LENGTH: Hamstrings: Right 80 deg; Left 80 deg  POSTURE: not assessed  PALPATION: Not tested  LUMBAR ROM:   AROM eval  Flexion 50%  Extension 25%  Right lateral flexion 25%  Left lateral flexion 25%  Right rotation 25%  Left rotation 25%   (Blank rows = not tested)  LOWER EXTREMITY ROM:     Active  Right eval Left eval  Hip flexion 90d 90d  Hip extension    Hip abduction    Hip adduction    Hip internal rotation    Hip external rotation    Knee flexion    Knee extension    Ankle dorsiflexion    Ankle plantarflexion    Ankle inversion    Ankle eversion     (Blank rows = not tested)  LOWER EXTREMITY MMT:    MMT Right eval Left eval  Hip flexion 4 4  Hip extension 4 4  Hip abduction 4 4  Hip adduction    Hip internal rotation    Hip external rotation    Knee flexion 4 4  Knee extension 4 4  Ankle dorsiflexion    Ankle plantarflexion    Ankle inversion    Ankle eversion    core 3 3   (Blank rows = not tested)  LUMBAR SPECIAL TESTS:  Straight leg raise test: Negative and Slump test: Negative , unable to assess FABER/FADIR due to body habitus  FUNCTIONAL TESTS:  30 seconds chair stand test 4 reps  GAIT: Distance  walked: 17fx2 Assistive device utilized: None Level of assistance: Complete Independence Comments: slow cadence and antalgic pattern  TODAY'S TREATMENT:  DATE: Eval and HEP    PATIENT EDUCATION:  Education details: Patient to demonstrate independence in HEP  Person educated: Patient Education method: Explanation Education comprehension: verbalized understanding and needs further education  HOME EXERCISE PROGRAM: Access Code: [email or web address] URL: [email or web address] Date: 03/29/2022 Prepared by: Sharlynn Oliphant  Exercises - Curl Up with Arms Crossed  - 2 x daily - 5 x weekly - 1 sets - 10 reps - Supine March  - 2 x daily - 5 x weekly - 1 sets - 10 reps - Sit to Stand with Armchair  - 2 x daily - 5 x weekly - 1 sets - 5 reps - Standing Heel Raise with Support  - 2 x daily - 5 x weekly - 1 sets - 10 reps - Standing March with Counter Support  - 2 x daily - 5 x weekly - 1 sets - 10 reps  ASSESSMENT:  CLINICAL IMPRESSION: Patient is a 66 y.o. female who was seen today for physical therapy evaluation and treatment for low back and BLE pain. No neuro tension signs elicited, core weakness evident, soft tissue restrictions in L hip appear to drive back pain but body habitus limits testing positions.  Patient is a good candidate to improve flexibility, core and LE strength and functional mobility.   OBJECTIVE IMPAIRMENTS: Abnormal gait, decreased activity tolerance, decreased endurance, decreased knowledge of condition, decreased mobility, difficulty walking, decreased ROM, decreased strength, impaired flexibility, improper body mechanics, postural dysfunction, obesity, and pain.   ACTIVITY LIMITATIONS: carrying, lifting, bending, standing, squatting, sleeping, and stairs  PERSONAL FACTORS: Age, Fitness, and Time since onset of injury/illness/exacerbation are  also affecting patient's functional outcome.   REHAB POTENTIAL: Good  CLINICAL DECISION MAKING: Stable/uncomplicated  EVALUATION COMPLEXITY: Low   GOALS: Goals reviewed with patient? No  SHORT TERM GOALS: Target date: 04/19/2022    Patient to demonstrate independence in HEP  Baseline:[id number] Goal status: INITIAL  2.  Increase 30s stand test to 5 reps Baseline: 4 reps  Goal status: INITIAL   LONG TERM GOALS: Target date: 05/10/2022    Increase FOTO score to 55 Baseline: 54 Goal status: INITIAL  2.  Increase core strength to 3+/5 Baseline: 3/5  Goal status: INITIAL  3.  Increase BLE strength to 4+/5 Baseline: 4/5 Goal status: INITIAL  4.  Increase all AROM lumbar spine to 50% Baseline:  AROM eval  Flexion 50%  Extension 25%  Right lateral flexion 25%  Left lateral flexion 25%  Right rotation 25%  Left rotation 25%   Goal status: INITIAL    PLAN:  PT FREQUENCY: 2x/week  PT DURATION: 6 weeks  PLANNED INTERVENTIONS: Therapeutic exercises, Therapeutic activity, Neuromuscular re-education, Balance training, Gait training, Patient/Family education, Self Care, Joint mobilization, Stair training, Aquatic Therapy, Dry Needling, Manual therapy, and Re-evaluation.  PLAN FOR NEXT SESSION: HEP review and update, stretching and flexibility tasks, core and LE strengthening   Lanice Shirts, PT 03/29/2022, 5:12 PM

## 2022-03-31 ENCOUNTER — Ambulatory Visit (INDEPENDENT_AMBULATORY_CARE_PROVIDER_SITE_OTHER): Payer: Medicare Other

## 2022-03-31 DIAGNOSIS — J4550 Severe persistent asthma, uncomplicated: Secondary | ICD-10-CM | POA: Diagnosis not present

## 2022-04-11 DIAGNOSIS — T783XXA Angioneurotic edema, initial encounter: Secondary | ICD-10-CM | POA: Diagnosis not present

## 2022-04-11 DIAGNOSIS — T7840XA Allergy, unspecified, initial encounter: Secondary | ICD-10-CM | POA: Diagnosis not present

## 2022-04-20 NOTE — Therapy (Signed)
OUTPATIENT PHYSICAL THERAPY TREATMENT NOTE   Patient Name: Maria Nelson MRN: [id number] DOB:[birth year], 67 y.o., female Today's Date: 04/21/2022  PCP: Maria Killian, MD   REFERRING PROVIDER: Charise Killian, MD    END OF SESSION:   PT End of Session - 04/21/22 1336     Visit Number 2    Date for PT Re-Evaluation 05/24/22    Authorization Type UHC    PT Start Time 1400    PT Stop Time 1440    PT Time Calculation (min) 40 min    Activity Tolerance Patient tolerated treatment well    Behavior During Therapy [facility] for tasks assessed/performed             Past Medical History:  Diagnosis Date   ANGIOEDEMA 12/16/2009   Asthma    Carpal tunnel syndrome 06/23/2011   Patient notes history of bilateral carpal tunnel syndrome.  Now has symptoms of right hand carpal tunnel.    COPD (chronic obstructive pulmonary disease) ([place])    secondary to tobacco use // No PFTs on file   COVID-19    ETOH abuse    Pt stopped in 3/08   Gastrointestinal hemorrhage    secondary to PUD on March 2008   OSA on CPAP    noncompliant with CPAP (2/2 it being depressing)   Peptic ulcer disease    +H. pylori (antigen)- Dr. Olevia Perches- treated   STRESS INCONTINENCE 01/24/2007   Tobacco abuse    quit in 2010   Urinary incontinence    Urticaria    Urticaria 07/01/2015   Past Surgical History:  Procedure Laterality Date   BREAST EXCISIONAL BIOPSY Right 1994   MANDIBLE SURGERY     due to trauma   TUBAL LIGATION     Patient Active Problem List   Diagnosis Date Noted   Stage 3a chronic kidney disease ([place]) 03/15/2022   Pulmonary nodule less than 6 mm determined by computed tomography of lung 12/03/2021   Low iron 12/03/2021   Benign paroxysmal positional vertigo 09/03/2021   Colon cancer screening 07/14/2021   Healthcare maintenance 01/28/2021   Hyperlipidemia 11/22/2019   Adnexal pain 09/06/2018   Rash 05/11/2018   Paroxysmal atrial fibrillation ([place]) 08/24/2017   Dyspepsia 08/24/2017    Chronic pain of both knees 08/16/2016   Back pain 06/23/2016   Allergic rhinitis with nonallergic component 03/04/2015   Asthma with COPD 06/19/2014   Blood pressure elevated without history of HTN 03/05/2014   HNP (herniated nucleus pulposus), lumbar 04/16/2013   Tenosynovitis, de Quervain 11/15/2012   Morbid obesity with BMI of 45.0-49.9, adult ([place]) 09/08/2012   Carpal tunnel syndrome 06/23/2011   Shoulder pain 09/02/2010   Sleep apnea 09/02/2010   GERD 12/24/2009   Urticaria/angioedema 12/16/2009   STRESS INCONTINENCE 01/24/2007   History of peptic ulcer disease 07/26/2006    REFERRING DIAG: M25.561,M25.562,G89.29 (ICD-10-CM) - Chronic pain of both knees M25.571,G89.29,M25.572 (ICD-10-CM) - Chronic pain of both ankles M54.50,G89.29 (ICD-10-CM) - Chronic left-sided low back pain without sciatica   THERAPY DIAG:  Other low back pain  Muscle weakness (generalized)  Rationale for Evaluation and Treatment Rehabilitation  PERTINENT HISTORY: [name] is a 67 yo in clinic today for follow-up of chronic conditions and first visit with me. She would also like to discuss chronic bilateral knee and ankle pain, as well as left lower back pain. Please see assessment/plan in problem-based charting for further details of today's visit.   PRECAUTIONS: None   SUBJECTIVE:  SUBJECTIVE STATEMENT:  Returns from trip to [place] w/o any increase in symptoms.  Has been compliant with HEP   PAIN:  Are you having pain? No   OBJECTIVE: (objective measures completed at initial evaluation unless otherwise dated)   DIAGNOSTIC FINDINGS:  None available   PATIENT SURVEYS:  FOTO 54(66 predicted)   SCREENING FOR RED FLAGS: negative COGNITION: Overall cognitive status: Within functional limits for tasks assessed                           SENSATION: Not tested   MUSCLE LENGTH: Hamstrings: Right 80 deg; Left 80 deg   POSTURE: not assessed   PALPATION: Not tested   LUMBAR ROM:    AROM eval  Flexion 50%  Extension 25%  Right lateral flexion 25%  Left lateral flexion 25%  Right rotation 25%  Left rotation 25%   (Blank rows = not tested)   LOWER EXTREMITY ROM:      Active  Right eval Left eval  Hip flexion 90d 90d  Hip extension      Hip abduction      Hip adduction      Hip internal rotation      Hip external rotation      Knee flexion      Knee extension      Ankle dorsiflexion      Ankle plantarflexion      Ankle inversion      Ankle eversion       (Blank rows = not tested)   LOWER EXTREMITY MMT:     MMT Right eval Left eval  Hip flexion 4 4  Hip extension 4 4  Hip abduction 4 4  Hip adduction      Hip internal rotation      Hip external rotation      Knee flexion 4 4  Knee extension 4 4  Ankle dorsiflexion      Ankle plantarflexion      Ankle inversion      Ankle eversion      core 3 3   (Blank rows = not tested)   LUMBAR SPECIAL TESTS:  Straight leg raise test: Negative and Slump test: Negative , unable to assess FABER/FADIR due to body habitus   FUNCTIONAL TESTS:  30 seconds chair stand test 4 reps   GAIT: Distance walked: 26fx2 Assistive device utilized: None Level of assistance: Complete Independence Comments: slow cadence and antalgic pattern   TODAY'S TREATMENT:       [facility] Adult PT Treatment:                                                DATE: 04/21/22 Therapeutic Exercise: Nustep L2 6 min Curl up 15x Supine march 15/15 SLR 15/15 Supine hip fallouts GTB 15x Bridge 15x Standing heel raise against wall 15 Standing marching against wall 15/15 Supine hip flexor stretch 30s x2 Bil Seated hamstring stretch 30s x2 Bil 5x STS w/OH reach  DATE: 03/29/22 Eval and HEP      PATIENT EDUCATION:  Education details: Patient to demonstrate independence in HEP  Person educated: Patient Education method: Explanation Education comprehension: verbalized understanding and needs further education   HOME EXERCISE PROGRAM: Access Code: [email or web address] URL: [email or web address] Date: 03/29/2022 Prepared by: Sharlynn Oliphant   Exercises - Curl Up with Arms Crossed  - 2 x daily - 5 x weekly - 1 sets - 10 reps - Supine March  - 2 x daily - 5 x weekly - 1 sets - 10 reps - Sit to Stand with Armchair  - 2 x daily - 5 x weekly - 1 sets - 5 reps - Standing Heel Raise with Support  - 2 x daily - 5 x weekly - 1 sets - 10 reps - Standing March with Counter Support  - 2 x daily - 5 x weekly - 1 sets - 10 reps   ASSESSMENT:   CLINICAL IMPRESSION: First session following eval and reporting minimal pain and compliance with HEP.  Introduced additional stretching tasks, strengthening of BLEs and trunk/core, include BM training with STS task   OBJECTIVE IMPAIRMENTS: Abnormal gait, decreased activity tolerance, decreased endurance, decreased knowledge of condition, decreased mobility, difficulty walking, decreased ROM, decreased strength, impaired flexibility, improper body mechanics, postural dysfunction, obesity, and pain.    ACTIVITY LIMITATIONS: carrying, lifting, bending, standing, squatting, sleeping, and stairs   PERSONAL FACTORS: Age, Fitness, and Time since onset of injury/illness/exacerbation are also affecting patient's functional outcome.    REHAB POTENTIAL: Good   CLINICAL DECISION MAKING: Stable/uncomplicated   EVALUATION COMPLEXITY: Low     GOALS: Goals reviewed with patient? No   SHORT TERM GOALS: Target date: 04/19/2022     Patient to demonstrate independence in HEP  Baseline:[id number] Goal status: INITIAL   2.  Increase 30s stand test to 5 reps Baseline: 4 reps  Goal status: INITIAL     LONG TERM  GOALS: Target date: 05/10/2022     Increase FOTO score to 55 Baseline: 54 Goal status: INITIAL   2.  Increase core strength to 3+/5 Baseline: 3/5  Goal status: INITIAL   3.  Increase BLE strength to 4+/5 Baseline: 4/5 Goal status: INITIAL   4.  Increase all AROM lumbar spine to 50% Baseline:  AROM eval  Flexion 50%  Extension 25%  Right lateral flexion 25%  Left lateral flexion 25%  Right rotation 25%  Left rotation 25%    Goal status: INITIAL       PLAN:   PT FREQUENCY: 2x/week   PT DURATION: 6 weeks   PLANNED INTERVENTIONS: Therapeutic exercises, Therapeutic activity, Neuromuscular re-education, Balance training, Gait training, Patient/Family education, Self Care, Joint mobilization, Stair training, Aquatic Therapy, Dry Needling, Manual therapy, and Re-evaluation.   PLAN FOR NEXT SESSION: HEP review and update, stretching and flexibility tasks, core and LE strengthening   Lanice Shirts, PT 04/21/2022, 2:38 PM

## 2022-04-21 ENCOUNTER — Ambulatory Visit: Payer: 59 | Attending: Internal Medicine

## 2022-04-21 DIAGNOSIS — M6281 Muscle weakness (generalized): Secondary | ICD-10-CM | POA: Insufficient documentation

## 2022-04-21 DIAGNOSIS — M5459 Other low back pain: Secondary | ICD-10-CM | POA: Diagnosis not present

## 2022-04-23 ENCOUNTER — Ambulatory Visit: Payer: 59

## 2022-04-23 DIAGNOSIS — M5459 Other low back pain: Secondary | ICD-10-CM

## 2022-04-23 DIAGNOSIS — M6281 Muscle weakness (generalized): Secondary | ICD-10-CM

## 2022-04-23 NOTE — Therapy (Signed)
OUTPATIENT PHYSICAL THERAPY TREATMENT NOTE   Patient Name: Maria Nelson MRN: [id number] DOB:1955-05-18, 67 y.o., female Today's Date: 04/23/2022  PCP: Charise Killian, MD   REFERRING PROVIDER: Charise Killian, MD    END OF SESSION:   PT End of Session - 04/23/22 1345     Visit Number 3    Number of Visits 12    Date for PT Re-Evaluation 05/24/22    Authorization Type U[employer]    Activity Tolerance Patient tolerated treatment well    Behavior During Therapy [facility] for tasks assessed/performed             Past Medical History:  Diagnosis Date   ANGIOEDEMA 12/16/2009   Asthma    Carpal tunnel syndrome 06/23/2011   Patient notes history of bilateral carpal tunnel syndrome.  Now has symptoms of right hand carpal tunnel.    COPD (chronic obstructive pulmonary disease) ([place])    secondary to tobacco use // No PFTs on file   COVID-19    ETOH abuse    Pt stopped in 3/08   Gastrointestinal hemorrhage    secondary to PUD on March 2008   OSA on CPAP    noncompliant with CPAP (2/2 it being depressing)   Peptic ulcer disease    +H. pylori (antigen)- Dr. Olevia Perches- treated   STRESS INCONTINENCE 01/24/2007   Tobacco abuse    quit in 2010   Urinary incontinence    Urticaria    Urticaria 07/01/2015   Past Surgical History:  Procedure Laterality Date   BREAST EXCISIONAL BIOPSY Right 1994   MANDIBLE SURGERY     due to trauma   TUBAL LIGATION     Patient Active Problem List   Diagnosis Date Noted   Stage 3a chronic kidney disease ([place]) 03/15/2022   Pulmonary nodule less than 6 mm determined by computed tomography of lung 12/03/2021   Low iron 12/03/2021   Benign paroxysmal positional vertigo 09/03/2021   Colon cancer screening 07/14/2021   Healthcare maintenance 01/28/2021   Hyperlipidemia 11/22/2019   Adnexal pain 09/06/2018   Rash 05/11/2018   Paroxysmal atrial fibrillation ([place]) 08/24/2017   Dyspepsia 08/24/2017   Chronic pain of both knees 08/16/2016   Back pain  06/23/2016   Allergic rhinitis with nonallergic component 03/04/2015   Asthma with COPD 06/19/2014   Blood pressure elevated without history of HTN 03/05/2014   HNP (herniated nucleus pulposus), lumbar 04/16/2013   Tenosynovitis, de Quervain 11/15/2012   Morbid obesity with BMI of 45.0-49.9, adult ([place]) 09/08/2012   Carpal tunnel syndrome 06/23/2011   Shoulder pain 09/02/2010   Sleep apnea 09/02/2010   GERD 12/24/2009   Urticaria/angioedema 12/16/2009   STRESS INCONTINENCE 01/24/2007   History of peptic ulcer disease 07/26/2006    REFERRING DIAG: M25.561,M25.562,G89.29 (ICD-10-CM) - Chronic pain of both knees M25.571,G89.29,M25.572 (ICD-10-CM) - Chronic pain of both ankles M54.50,G89.29 (ICD-10-CM) - Chronic left-sided low back pain without sciatica   THERAPY DIAG:  Other low back pain  Muscle weakness (generalized)  Rationale for Evaluation and Treatment Rehabilitation  PERTINENT HISTORY: [name] is a 67 yo in clinic today for follow-up of chronic conditions and first visit with me. She would also like to discuss chronic bilateral knee and ankle pain, as well as left lower back pain. Please see assessment/plan in problem-based charting for further details of today's visit.   PRECAUTIONS: None   SUBJECTIVE:  SUBJECTIVE STATEMENT:  Continues with minimal symptoms in low back.  As long as she stretches, symptoms minimized.   PAIN:  Are you having pain? No   OBJECTIVE: (objective measures completed at initial evaluation unless otherwise dated)   DIAGNOSTIC FINDINGS:  None available   PATIENT SURVEYS:  FOTO 54(66 predicted)   SCREENING FOR RED FLAGS: negative COGNITION: Overall cognitive status: Within functional limits for tasks assessed                          SENSATION: Not  tested   MUSCLE LENGTH: Hamstrings: Right 80 deg; Left 80 deg   POSTURE: not assessed   PALPATION: Not tested   LUMBAR ROM:    AROM eval  Flexion 50%  Extension 25%  Right lateral flexion 25%  Left lateral flexion 25%  Right rotation 25%  Left rotation 25%   (Blank rows = not tested)   LOWER EXTREMITY ROM:      Active  Right eval Left eval  Hip flexion 90d 90d  Hip extension      Hip abduction      Hip adduction      Hip internal rotation      Hip external rotation      Knee flexion      Knee extension      Ankle dorsiflexion      Ankle plantarflexion      Ankle inversion      Ankle eversion       (Blank rows = not tested)   LOWER EXTREMITY MMT:     MMT Right eval Left eval  Hip flexion 4 4  Hip extension 4 4  Hip abduction 4 4  Hip adduction      Hip internal rotation      Hip external rotation      Knee flexion 4 4  Knee extension 4 4  Ankle dorsiflexion      Ankle plantarflexion      Ankle inversion      Ankle eversion      core 3 3   (Blank rows = not tested)   LUMBAR SPECIAL TESTS:  Straight leg raise test: Negative and Slump test: Negative , unable to assess FABER/FADIR due to body habitus   FUNCTIONAL TESTS:  30 seconds chair stand test 4 reps   GAIT: Distance walked: 36fx2 Assistive device utilized: None Level of assistance: Complete Independence Comments: slow cadence and antalgic pattern   TODAY'S TREATMENT:       [facility] Adult PT Treatment:                                                DATE: 04/23/22 Therapeutic Exercise: Nustep L3 8 min Curl up 15x Supine march 15/15 2# SLR 15/15 2# 90/90 11, 17, 24s holds Supine hip fallouts GTB 15x Clams GTB 15x Bil Bridge w/ball 15x Standing heel raise against wall 15 Standing marching against wall 15/15 10x STS w/OH reach   [facility]                                                [facility]: [facility] Therapeutic Exercise: Nustep L2 6 min Curl up  15x Supine march 15/15 SLR  15/15 Supine hip fallouts GTB 15x Bridge 15x Standing heel raise against wall 15 Standing marching against wall 15/15 Supine hip flexor stretch 30s x2 Bil Seated hamstring stretch 30s x2 Bil 5x STS w/OH reach                                                                                                                           DATE: 03/29/22 Eval and HEP      PATIENT EDUCATION:  Education details: Patient to demonstrate independence in HEP  Person educated: Patient Education method: Explanation Education comprehension: verbalized understanding and needs further education   HOME EXERCISE PROGRAM: Access Code: [email or web address] URL: [email or web address] Date: 03/29/2022 Prepared by: Sharlynn Oliphant   Exercises - Curl Up with Arms Crossed  - 2 x daily - 5 x weekly - 1 sets - 10 reps - Supine March  - 2 x daily - 5 x weekly - 1 sets - 10 reps - Sit to Stand with Armchair  - 2 x daily - 5 x weekly - 1 sets - 5 reps - Standing Heel Raise with Support  - 2 x daily - 5 x weekly - 1 sets - 10 reps - Standing March with Counter Support  - 2 x daily - 5 x weekly - 1 sets - 10 reps   ASSESSMENT:   CLINICAL IMPRESSION: Today focus was on core and hip strengthening tasks.  Added isometric core with patient unable to hold position for 30s.  Some low back symptoms identified when bridging.  Reviewed log roll technique when transitioning from supine to sit.   OBJECTIVE IMPAIRMENTS: Abnormal gait, decreased activity tolerance, decreased endurance, decreased knowledge of condition, decreased mobility, difficulty walking, decreased ROM, decreased strength, impaired flexibility, improper body mechanics, postural dysfunction, obesity, and pain.    ACTIVITY LIMITATIONS: carrying, lifting, bending, standing, squatting, sleeping, and stairs   PERSONAL FACTORS: Age, Fitness, and Time since onset of injury/illness/exacerbation are also affecting patient's functional outcome.    REHAB POTENTIAL:  Good   CLINICAL DECISION MAKING: Stable/uncomplicated   EVALUATION COMPLEXITY: Low     GOALS: Goals reviewed with patient? No   SHORT TERM GOALS: Target date: 04/19/2022     Patient to demonstrate independence in HEP  Baseline:[id number] Goal status: INITIAL   2.  Increase 30s stand test to 5 reps Baseline: 4 reps  Goal status: INITIAL     LONG TERM GOALS: Target date: 05/10/2022     Increase FOTO score to 55 Baseline: 54 Goal status: INITIAL   2.  Increase core strength to 3+/5 Baseline: 3/5  Goal status: INITIAL   3.  Increase BLE strength to 4+/5 Baseline: 4/5 Goal status: INITIAL   4.  Increase all AROM lumbar spine to 50% Baseline:  AROM eval  Flexion 50%  Extension 25%  Right lateral flexion 25%  Left lateral flexion 25%  Right rotation 25%  Left rotation 25%    Goal status: INITIAL  PLAN:   PT FREQUENCY: 2x/week   PT DURATION: 6 weeks   PLANNED INTERVENTIONS: Therapeutic exercises, Therapeutic activity, Neuromuscular re-education, Balance training, Gait training, Patient/Family education, Self Care, Joint mobilization, Stair training, Aquatic Therapy, Dry Needling, Manual therapy, and Re-evaluation.   PLAN FOR NEXT SESSION: HEP review and update, stretching and flexibility tasks, core and LE strengthening   Lanice Shirts, PT 04/23/2022, 1:46 PM

## 2022-04-27 ENCOUNTER — Other Ambulatory Visit (HOSPITAL_COMMUNITY): Payer: Self-pay

## 2022-04-28 ENCOUNTER — Ambulatory Visit: Payer: 59

## 2022-04-28 ENCOUNTER — Ambulatory Visit: Payer: Medicare Other | Admitting: Internal Medicine

## 2022-04-30 ENCOUNTER — Ambulatory Visit: Payer: 59

## 2022-04-30 DIAGNOSIS — M6281 Muscle weakness (generalized): Secondary | ICD-10-CM

## 2022-04-30 DIAGNOSIS — M5459 Other low back pain: Secondary | ICD-10-CM

## 2022-04-30 NOTE — Therapy (Signed)
OUTPATIENT PHYSICAL THERAPY TREATMENT NOTE   Patient Name: Maria Nelson MRN: [id number] DOB:11-13-1955, 67 y.o., female Today's Date: 04/30/2022  PCP: Charise Killian, MD   REFERRING PROVIDER: Charise Killian, MD    END OF SESSION:   PT End of Session - 04/30/22 1353     Visit Number 4    Number of Visits 12    Date for PT Re-Evaluation 05/24/22    Authorization Type UHC    PT Start Time 6962    PT Stop Time 1425    PT Time Calculation (min) 40 min    Activity Tolerance Patient tolerated treatment well    Behavior During Therapy [facility] for tasks assessed/performed              Past Medical History:  Diagnosis Date   ANGIOEDEMA 12/16/2009   Asthma    Carpal tunnel syndrome 06/23/2011   Patient notes history of bilateral carpal tunnel syndrome.  Now has symptoms of right hand carpal tunnel.    COPD (chronic obstructive pulmonary disease) ([place])    secondary to tobacco use // No PFTs on file   COVID-19    ETOH abuse    Pt stopped in 3/08   Gastrointestinal hemorrhage    secondary to PUD on March 2008   OSA on CPAP    noncompliant with CPAP (2/2 it being depressing)   Peptic ulcer disease    +H. pylori (antigen)- Dr. Olevia Perches- treated   STRESS INCONTINENCE 01/24/2007   Tobacco abuse    quit in 2010   Urinary incontinence    Urticaria    Urticaria 07/01/2015   Past Surgical History:  Procedure Laterality Date   BREAST EXCISIONAL BIOPSY Right 1994   MANDIBLE SURGERY     due to trauma   TUBAL LIGATION     Patient Active Problem List   Diagnosis Date Noted   Stage 3a chronic kidney disease ([place]) 03/15/2022   Pulmonary nodule less than 6 mm determined by computed tomography of lung 12/03/2021   Low iron 12/03/2021   Benign paroxysmal positional vertigo 09/03/2021   Colon cancer screening 07/14/2021   Healthcare maintenance 01/28/2021   Hyperlipidemia 11/22/2019   Adnexal pain 09/06/2018   Rash 05/11/2018   Paroxysmal atrial fibrillation ([place]) 08/24/2017    Dyspepsia 08/24/2017   Chronic pain of both knees 08/16/2016   Back pain 06/23/2016   Allergic rhinitis with nonallergic component 03/04/2015   Asthma with COPD 06/19/2014   Blood pressure elevated without history of HTN 03/05/2014   HNP (herniated nucleus pulposus), lumbar 04/16/2013   Tenosynovitis, de Quervain 11/15/2012   Morbid obesity with BMI of 45.0-49.9, adult ([place]) 09/08/2012   Carpal tunnel syndrome 06/23/2011   Shoulder pain 09/02/2010   Sleep apnea 09/02/2010   GERD 12/24/2009   Urticaria/angioedema 12/16/2009   STRESS INCONTINENCE 01/24/2007   History of peptic ulcer disease 07/26/2006    REFERRING DIAG: M25.561,M25.562,G89.29 (ICD-10-CM) - Chronic pain of both knees M25.571,G89.29,M25.572 (ICD-10-CM) - Chronic pain of both ankles M54.50,G89.29 (ICD-10-CM) - Chronic left-sided low back pain without sciatica   THERAPY DIAG:  Other low back pain  Muscle weakness (generalized)  Rationale for Evaluation and Treatment Rehabilitation  PERTINENT HISTORY: Ms. Alatorre is a 67 yo in clinic today for follow-up of chronic conditions and first visit with me. She would also like to discuss chronic bilateral knee and ankle pain, as well as left lower back pain. Please see assessment/plan in problem-based charting for further details of today's visit.   PRECAUTIONS: None  SUBJECTIVE:                                                                                                                                                                                      SUBJECTIVE STATEMENT:  back symptoms minimal.  Requests to work on core tasks   PAIN:  Are you having pain? No   OBJECTIVE: (objective measures completed at initial evaluation unless otherwise dated)   DIAGNOSTIC FINDINGS:  None available   PATIENT SURVEYS:  FOTO 54(66 predicted)   SCREENING FOR RED FLAGS: negative COGNITION: Overall cognitive status: Within functional limits for tasks assessed                           SENSATION: Not tested   MUSCLE LENGTH: Hamstrings: Right 80 deg; Left 80 deg   POSTURE: not assessed   PALPATION: Not tested   LUMBAR ROM:    AROM eval  Flexion 50%  Extension 25%  Right lateral flexion 25%  Left lateral flexion 25%  Right rotation 25%  Left rotation 25%   (Blank rows = not tested)   LOWER EXTREMITY ROM:      Active  Right eval Left eval  Hip flexion 90d 90d  Hip extension      Hip abduction      Hip adduction      Hip internal rotation      Hip external rotation      Knee flexion      Knee extension      Ankle dorsiflexion      Ankle plantarflexion      Ankle inversion      Ankle eversion       (Blank rows = not tested)   LOWER EXTREMITY MMT:     MMT Right eval Left eval  Hip flexion 4 4  Hip extension 4 4  Hip abduction 4 4  Hip adduction      Hip internal rotation      Hip external rotation      Knee flexion 4 4  Knee extension 4 4  Ankle dorsiflexion      Ankle plantarflexion      Ankle inversion      Ankle eversion      core 3 3   (Blank rows = not tested)   LUMBAR SPECIAL TESTS:  Straight leg raise test: Negative and Slump test: Negative , unable to assess FABER/FADIR due to body habitus   FUNCTIONAL TESTS:  30 seconds chair stand test 4 reps   GAIT: Distance walked: 22fx2 Assistive device utilized: None Level of assistance: Complete Independence Comments: slow cadence  and antalgic pattern   TODAY'S TREATMENT:    [facility] Adult PT Treatment:                                                DATE: 04/30/22 Therapeutic Exercise: Nustep L4 8 min Seated latissimus press 3s x15 Seated hip toss 5# 15x Seated chops 5# 15/15 Seated V's 5# 15x Seated hamstring stretch 30s x2 Bil Supine hip flexor stretch 30s x2 Bil 90/90 30s x2 Bridge w/5# ball 15x 10x STS w/OH reach using 5# ball     [facility] Adult PT Treatment:                                                DATE: 04/23/22 Therapeutic Exercise: Nustep L3 8  min Curl up 15x Supine march 15/15 2# SLR 15/15 2# 90/90 11, 17, 24s holds Supine hip fallouts GTB 15x Clams GTB 15x Bil Bridge w/ball 15x Standing heel raise against wall 15 Standing marching against wall 15/15 10x STS w/OH reach 5# ball  [facility] Adult PT Treatment:                                                DATE: 04/21/22 Therapeutic Exercise: Nustep L2 6 min Curl up 15x Supine march 15/15 SLR 15/15 Supine hip fallouts GTB 15x Bridge 15x Standing heel raise against wall 15 Standing marching against wall 15/15 Supine hip flexor stretch 30s x2 Bil Seated hamstring stretch 30s x2 Bil 5x STS w/OH reach                                                                                                                           DATE: 03/29/22 Eval and HEP      PATIENT EDUCATION:  Education details: Patient to demonstrate independence in HEP  Person educated: Patient Education method: Explanation Education comprehension: verbalized understanding and needs further education   HOME EXERCISE PROGRAM: Access Code: [email or web address] URL: [email or web address] Date: 03/29/2022 Prepared by: Sharlynn Oliphant   Exercises - Curl Up with Arms Crossed  - 2 x daily - 5 x weekly - 1 sets - 10 reps - Supine March  - 2 x daily - 5 x weekly - 1 sets - 10 reps - Sit to Stand with Armchair  - 2 x daily - 5 x weekly - 1 sets - 5 reps - Standing Heel Raise with Support  - 2 x daily - 5 x weekly - 1 sets - 10 reps - Standing March with Counter Support  - 2 x daily - 5 x weekly - 1  sets - 10 reps   ASSESSMENT:   CLINICAL IMPRESSION: Continued to work on core and abdominal work adding seated core tasks incorporating concentric/eccentric trunk motions, emphasis on engaging obliques and rectus abdominus and functional strengthening.  No increase in discomfort but feels core muscles were worked.   OBJECTIVE IMPAIRMENTS: Abnormal gait, decreased activity tolerance, decreased endurance, decreased  knowledge of condition, decreased mobility, difficulty walking, decreased ROM, decreased strength, impaired flexibility, improper body mechanics, postural dysfunction, obesity, and pain.    ACTIVITY LIMITATIONS: carrying, lifting, bending, standing, squatting, sleeping, and stairs   PERSONAL FACTORS: Age, Fitness, and Time since onset of injury/illness/exacerbation are also affecting patient's functional outcome.    REHAB POTENTIAL: Good   CLINICAL DECISION MAKING: Stable/uncomplicated   EVALUATION COMPLEXITY: Low     GOALS: Goals reviewed with patient? No   SHORT TERM GOALS: Target date: 04/19/2022     Patient to demonstrate independence in HEP  Baseline:[id number] Goal status: INITIAL   2.  Increase 30s stand test to 5 reps Baseline: 4 reps  Goal status: INITIAL     LONG TERM GOALS: Target date: 05/10/2022     Increase FOTO score to 55 Baseline: 54 Goal status: INITIAL   2.  Increase core strength to 3+/5 Baseline: 3/5  Goal status: INITIAL   3.  Increase BLE strength to 4+/5 Baseline: 4/5 Goal status: INITIAL   4.  Increase all AROM lumbar spine to 50% Baseline:  AROM eval  Flexion 50%  Extension 25%  Right lateral flexion 25%  Left lateral flexion 25%  Right rotation 25%  Left rotation 25%    Goal status: INITIAL       PLAN:   PT FREQUENCY: 2x/week   PT DURATION: 6 weeks   PLANNED INTERVENTIONS: Therapeutic exercises, Therapeutic activity, Neuromuscular re-education, Balance training, Gait training, Patient/Family education, Self Care, Joint mobilization, Stair training, Aquatic Therapy, Dry Needling, Manual therapy, and Re-evaluation.   PLAN FOR NEXT SESSION: HEP review and update, stretching and flexibility tasks, core and LE strengthening   Lanice Shirts, PT 04/30/2022, 2:32 PM

## 2022-05-03 ENCOUNTER — Ambulatory Visit (INDEPENDENT_AMBULATORY_CARE_PROVIDER_SITE_OTHER): Payer: 59 | Admitting: Family Medicine

## 2022-05-03 ENCOUNTER — Other Ambulatory Visit: Payer: Self-pay

## 2022-05-03 ENCOUNTER — Encounter: Payer: Self-pay | Admitting: Family Medicine

## 2022-05-03 VITALS — BP 136/78 | HR 62 | Temp 98.0°F | Resp 20 | Ht 60.0 in | Wt 259.7 lb

## 2022-05-03 DIAGNOSIS — J3089 Other allergic rhinitis: Secondary | ICD-10-CM

## 2022-05-03 DIAGNOSIS — L501 Idiopathic urticaria: Secondary | ICD-10-CM | POA: Diagnosis not present

## 2022-05-03 DIAGNOSIS — K219 Gastro-esophageal reflux disease without esophagitis: Secondary | ICD-10-CM | POA: Diagnosis not present

## 2022-05-03 DIAGNOSIS — J449 Chronic obstructive pulmonary disease, unspecified: Secondary | ICD-10-CM

## 2022-05-03 DIAGNOSIS — J4489 Other specified chronic obstructive pulmonary disease: Secondary | ICD-10-CM

## 2022-05-03 DIAGNOSIS — T783XXD Angioneurotic edema, subsequent encounter: Secondary | ICD-10-CM

## 2022-05-03 DIAGNOSIS — J302 Other seasonal allergic rhinitis: Secondary | ICD-10-CM

## 2022-05-03 MED ORDER — BREZTRI AEROSPHERE 160-9-4.8 MCG/ACT IN AERO: 2.0000 | INHALATION_SPRAY | Freq: Two times a day (BID) | RESPIRATORY_TRACT | 5 refills | Status: DC

## 2022-05-03 NOTE — Patient Instructions (Addendum)
Asthma/COPD Continue Breztri 2 puffs twice a day with spacer to help prevent cough and wheeze Continue albuterol 2 puffs every 4 hours as needed for cough or wheeze OR albuterol via nebulizer 1 unit dose every 4-6 hours as needed for cough, wheeze, tightness in chest, or shortness of breath. You may use albuterol 2 puffs 5-15 minutes before activity Continue Fasenra injections once every 8 weeks Follow up with pulmonary for management of COPD Get a follow up CT scan in 11/2022 as suggested to monitor right lower lobe nodule  Chronic urticaria Increase Xyzal to twice a day to try to control hives and swelling Consider starting Xolair injections If your symptoms re-occur, begin a journal of events that occurred for up to 6 hours before your symptoms began including foods and beverages consumed, soaps or perfumes you had contact with, and medications.   Allergic rhinitis Continue allergen avoidance measures directed toward dust mites, cat, dog, grass, mold, tree pollen, weed pollen, and ragweed pollen as listed below Continue azelastine 2 sprays twice a day as needed for a runny nose Continue cetirizine 10 mg once a day as needed for a runny nose as listed above Consider saline nasal rinses as needed for nasal symptoms. Use this before any medicated nasal sprays for best result  Reflux Continue Pantoprazole 40 mg once a day as previously prescribed.  Continue dietary and lifestyle modifications as listed below  Call the clinic if this treatment plan is not working well for you   Schedule a follow up appointment in 3 months or sooner if needed  Reducing Pollen Exposure The American Academy of Allergy, Asthma and Immunology suggests the following steps to reduce your exposure to pollen during allergy seasons. Do not hang sheets or clothing out to dry; pollen may collect on these items. Do not mow lawns or spend time around freshly cut grass; mowing stirs up pollen. Keep windows closed at  night.  Keep car windows closed while driving. Minimize morning activities outdoors, a time when pollen counts are usually at their highest. Stay indoors as much as possible when pollen counts or humidity is high and on windy days when pollen tends to remain in the air longer. Use air conditioning when possible.  Many air conditioners have filters that trap the pollen spores. Use a HEPA room air filter to remove pollen form the indoor air you breathe.  Control of Dog or Cat Allergen Avoidance is the best way to manage a dog or cat allergy. If you have a dog or cat and are allergic to dog or cats, consider removing the dog or cat from the home. If you have a dog or cat but don't want to find it a new home, or if your family wants a pet even though someone in the household is allergic, here are some strategies that may help keep symptoms at bay:  Keep the pet out of your bedroom and restrict it to only a few rooms. Be advised that keeping the dog or cat in only one room will not limit the allergens to that room. Don't pet, hug or kiss the dog or cat; if you do, wash your hands with soap and water. High-efficiency particulate air (HEPA) cleaners run continuously in a bedroom or living room can reduce allergen levels over time. Regular use of a high-efficiency vacuum cleaner or a central vacuum can reduce allergen levels. Giving your dog or cat a bath at least once a week can reduce airborne allergen.  Control of  Mold Allergen Mold and fungi can grow on a variety of surfaces provided certain temperature and moisture conditions exist.  Outdoor molds grow on plants, decaying vegetation and soil.  The major outdoor mold, Alternaria and Cladosporium, are found in very high numbers during hot and dry conditions.  Generally, a late Summer - Fall peak is seen for common outdoor fungal spores.  Rain will temporarily lower outdoor mold spore count, but counts rise rapidly when the rainy period ends.  The most  important indoor molds are Aspergillus and Penicillium.  Dark, humid and poorly ventilated basements are ideal sites for mold growth.  The next most common sites of mold growth are the bathroom and the kitchen.  Outdoor [employer] Use air conditioning and keep windows closed Avoid exposure to decaying vegetation. Avoid leaf raking. Avoid grain handling. Consider wearing a face mask if working in moldy areas.  Indoor Mold Control Maintain humidity below 50%. Clean washable surfaces with 5% bleach solution. Remove sources e.g. Contaminated carpets.   Control of Dust Mite Allergen Dust mites play a major role in allergic asthma and rhinitis. They occur in environments with high humidity wherever human skin is found. Dust mites absorb humidity from the atmosphere (ie, they do not drink) and feed on organic matter (including shed human and animal skin). Dust mites are a microscopic type of insect that you cannot see with the naked eye. High levels of dust mites have been detected from mattresses, pillows, carpets, upholstered furniture, bed covers, clothes, soft toys and any woven material. The principal allergen of the dust mite is found in its feces. A gram of dust may contain 1,000 mites and 250,000 fecal particles. Mite antigen is easily measured in the air during house cleaning activities. Dust mites do not bite and do not cause harm to humans, other than by triggering allergies/asthma.  Ways to decrease your exposure to dust mites in your home:  1. Encase mattresses, box springs and pillows with a mite-impermeable barrier or cover  2. Wash sheets, blankets and drapes weekly in hot water (130 F) with detergent and dry them in a dryer on the hot setting.  3. Have the room cleaned frequently with a vacuum cleaner and a damp dust-mop. For carpeting or rugs, vacuuming with a vacuum cleaner equipped with a high-efficiency particulate air (HEPA) filter. The dust mite allergic individual should  not be in a room which is being cleaned and should wait 1 hour after cleaning before going into the room.  4. Do not sleep on upholstered furniture (eg, couches).  5. If possible removing carpeting, upholstered furniture and drapery from the home is ideal. Horizontal blinds should be eliminated in the rooms where the person spends the most time (bedroom, study, television room). Washable vinyl, roller-type shades are optimal.  6. Remove all non-washable stuffed toys from the bedroom. Wash stuffed toys weekly like sheets and blankets above.  7. Reduce indoor humidity to less than 50%. Inexpensive humidity monitors can be purchased at most hardware stores. Do not use a humidifier as can make the problem worse and are not recommended.

## 2022-05-03 NOTE — Progress Notes (Signed)
[facility] [id number] Dept: [phone]  FOLLOW UP NOTE  Patient ID: Maria Nelson, female    DOB: 1955-10-06  Age: 67 y.o. MRN: [id number] Date of Office Visit: 05/03/2022  Assessment  Chief Complaint: ASTHMA, Follow-up, and Allergic Reaction  HPI Maria Nelson is a 67 year old female who presents to the clinic for follow-up visit.  She was last seen in this clinic on 11/02/2021 by Dr. Simona Huh for evaluation of asthma/COPD, allergic rhinitis, urticaria, angioedema, and reflux. She has a history of atrial fibrillation.  In the interim, chart review indicates that she had a chest CT on 11/27/2021 during a hospital visit which indicated a right middle lobe nodule.  At today's visit, she reports her asthma has been moderately well controlled with symptoms including shortness of breath with activity, wheezing especially occurring in the morning with clearing her throat, and cough producing mucus occurring mainly in the morning.  She continues Breztri 2 puffs twice a day with a spacer and uses albuterol before activity.  She continues Fasenra injections once every 8 weeks with no large or local reactions.  She reports a moderate decrease in her symptoms of asthma while continuing Fasenra injections.  It has been suggested that she follow up with pulmonary care to maximize her treatment for COPD, however, she has not seen a pulmonologist at this time.  Allergic rhinitis is reported as moderately well-controlled with postnasal drainage with frequent throat clearing as the main symptom.  She continues levocetirizine 5 mg once a day, however, reports that she is currently out of this medication over the last several weeks.  She is not currently using Flonase or nasal saline rinses.  Her last environmental allergy testing via lab was on 05/02/2020 and was positive to pollens, pets, dust mite, and mold.  She reports epistaxis occurring from the left nostril about once  a month.  She reports these episodes resolves in under 5 minutes.  Reflux is reported as well-controlled with pantoprazole daily.  She reports longstanding angioedema and hives with 3 episodes occurring within the last month involving tongue swelling, top lip swelling, and swelling underneath her tongue.  On the first of these occasions she reports that she was in [place] when she experienced tongue swelling and airway swelling for which she went to urgent care and received Benadryl and steroids with resolution in under 1 day.  The other 2 episodes of swelling resolved with Benadryl only in under 24 hours.  She had a normal tryptase and negative alpha gal on 06/19/2021.  Chart review indicates C4 within normal limits on 10/28/2015 as well as normal C1 esterase inhibitor, C3, and C4 on 07/30/2013.  Her current medications are listed in the chart.    IMPRESSION: CT scan 11/27/2021 1. Mild emphysema and bronchial thickening. Subsegmental atelectasis in the lingula and right lower lobe.  2. Right middle lobe 4 mm nodule. Although likely benign, if the patient is high-risk, given the morphology and/or location of this nodule a non-contrast chest CT can be considered in 12 months.This recommendation follows the consensus statement: Guidelines for Management of Incidental Pulmonary Nodules Detected on CT Images: From the Fleischner Society 2017; Radiology 2017; 284:228-243. 3. Mild dilatation of the main pulmonary artery suggesting pulmonary arterial hypertension. Aortic Atherosclerosis (ICD10-I70.0) and Emphysema (ICD10-J43.9). Electronically Signed   By: Keith Rake M.D.   On: 11/27/2021 03:07    Drug Allergies:  Allergies  Allergen Reactions   Bee Venom Anaphylaxis   Aspirin Nausea Only  Other Rash    EKG pads cause burning sensation as soon as applied    Physical Exam: BP 136/78   Pulse 62   Temp 98 F (36.7 C) (Temporal)   Resp 20   Ht 5' (1.524 m)   Wt 259 lb 11.2 oz (117.8 kg)   LMP  04/19/2004 (Approximate)   SpO2 92%   BMI 50.72 kg/m    Physical Exam Vitals reviewed.  Constitutional:      Appearance: Normal appearance.  HENT:     Head: Normocephalic and atraumatic.     Right Ear: Tympanic membrane normal.     Left Ear: Tympanic membrane normal.     Nose:     Comments: Bilateral naris slightly erythematous with clear nasal drainage noted.  Pharynx normal.  Ears normal.  Eyes normal.    Mouth/Throat:     Pharynx: Oropharynx is clear.  Eyes:     Conjunctiva/sclera: Conjunctivae normal.  Cardiovascular:     Rate and Rhythm: Normal rate and regular rhythm.     Heart sounds: Normal heart sounds. No murmur heard. Pulmonary:     Effort: Pulmonary effort is normal.     Breath sounds: Normal breath sounds.     Comments: Lungs clear to auscultation Musculoskeletal:        General: Normal range of motion.     Cervical back: Normal range of motion and neck supple.  Skin:    General: Skin is warm and dry.  Neurological:     Mental Status: She is alert and oriented to person, place, and time.  Psychiatric:        Mood and Affect: Mood normal.        Behavior: Behavior normal.        Thought Content: Thought content normal.        Judgment: Judgment normal.     Diagnostics: FVC 1.80, FEV1 1.11.  Predicted FVC 2.64, predicted FEV1 2.01.  Spirometry indicates mild restriction and mild airway obstruction.  This is consistent with previous spirometry readings.  Assessment and Plan: 1. Asthma with COPD   2. Seasonal and perennial allergic rhinitis   3. Urticaria   4. Angioedema, subsequent encounter   5. Gastroesophageal reflux disease without esophagitis     Meds ordered this encounter  Medications   Budeson-Glycopyrrol-Formoterol (BREZTRI AEROSPHERE) 160-9-4.8 MCG/ACT AERO    Sig: Inhale 2 puffs into the lungs 2 (two) times daily.    Dispense:  11 g    Refill:  5    Patient Instructions  Asthma/COPD Continue Breztri 2 puffs twice a day with spacer to  help prevent cough and wheeze Continue albuterol 2 puffs every 4 hours as needed for cough or wheeze OR albuterol via nebulizer 1 unit dose every 4-6 hours as needed for cough, wheeze, tightness in chest, or shortness of breath. You may use albuterol 2 puffs 5-15 minutes before activity Continue Fasenra injections once every 8 weeks Follow up with pulmonary for management of COPD Get a follow up CT scan in 11/2022 as suggested to monitor right lower lobe nodule  Chronic urticaria/angioedema Increase Xyzal to twice a day to try to control hives and swelling Consider starting Xolair injections If your symptoms re-occur, begin a journal of events that occurred for up to 6 hours before your symptoms began including foods and beverages consumed, soaps or perfumes you had contact with, and medications.   Allergic rhinitis Continue allergen avoidance measures directed toward dust mites, cat, dog, grass, mold, tree pollen, weed  pollen, and ragweed pollen as listed below Continue azelastine 2 sprays twice a day as needed for a runny nose Continue cetirizine 10 mg once a day as needed for a runny nose as listed above Consider saline nasal rinses as needed for nasal symptoms. Use this before any medicated nasal sprays for best result  Reflux Continue Pantoprazole 40 mg once a day as previously prescribed.  Continue dietary and lifestyle modifications as listed below  Call the clinic if this treatment plan is not working well for you  Schedule a follow up appointment in 3 months or sooner if needed   Return in about 3 months (around 08/02/2022), or if symptoms worsen or fail to improve.    Thank you for the opportunity to care for this patient.  Please do not hesitate to contact me with questions.  Gareth Morgan, FNP [facility]

## 2022-05-05 ENCOUNTER — Ambulatory Visit: Payer: 59

## 2022-05-05 DIAGNOSIS — M5459 Other low back pain: Secondary | ICD-10-CM | POA: Diagnosis not present

## 2022-05-05 DIAGNOSIS — M6281 Muscle weakness (generalized): Secondary | ICD-10-CM

## 2022-05-05 NOTE — Therapy (Signed)
OUTPATIENT PHYSICAL THERAPY TREATMENT NOTE   Patient Name: Maria Nelson MRN: [id number] DOB:[birth year], 67 y.o., female Today's Date: 05/05/2022  PCP: Maria Killian, MD   REFERRING PROVIDER: Charise Killian, MD    END OF SESSION:   PT End of Session - 05/05/22 1401     Visit Number 5    Number of Visits 12    Date for PT Re-Evaluation 05/24/22    Authorization Type [employer]    PT Start Time 1400    PT Stop Time 1440    PT Time Calculation (min) 40 min    Activity Tolerance Patient tolerated treatment well    Behavior During Therapy [facility] for tasks assessed/performed              Past Medical History:  Diagnosis Date   ANGIOEDEMA 12/16/2009   Asthma    Carpal tunnel syndrome 06/23/2011   Patient notes history of bilateral carpal tunnel syndrome.  Now has symptoms of right hand carpal tunnel.    COPD (chronic obstructive pulmonary disease) ([place])    secondary to tobacco use // No PFTs on file   COVID-19    ETOH abuse    Pt stopped in 3/08   Gastrointestinal hemorrhage    secondary to PUD on March 2008   OSA on CPAP    noncompliant with CPAP (2/2 it being depressing)   Peptic ulcer disease    +H. pylori (antigen)- Dr. Olevia Nelson- treated   STRESS INCONTINENCE 01/24/2007   Tobacco abuse    quit in 2010   Urinary incontinence    Urticaria    Urticaria 07/01/2015   Past Surgical History:  Procedure Laterality Date   BREAST EXCISIONAL BIOPSY Right 1994   MANDIBLE SURGERY     due to trauma   TUBAL LIGATION     Patient Active Problem List   Diagnosis Date Noted   Stage 3a chronic kidney disease ([place]) 03/15/2022   Pulmonary nodule less than 6 mm determined by computed tomography of lung 12/03/2021   Low iron 12/03/2021   Benign paroxysmal positional vertigo 09/03/2021   Colon cancer screening 07/14/2021   Healthcare maintenance 01/28/2021   Hyperlipidemia 11/22/2019   Adnexal pain 09/06/2018   Rash 05/11/2018   Paroxysmal atrial fibrillation ([facility]) 08/24/2017    Dyspepsia 08/24/2017   Chronic pain of both knees 08/16/2016   Back pain 06/23/2016   Urticaria 07/01/2015   Seasonal and perennial allergic rhinitis 03/04/2015   Asthma with COPD 06/19/2014   Blood pressure elevated without history of HTN 03/05/2014   HNP (herniated nucleus pulposus), lumbar 04/16/2013   Tenosynovitis, de Quervain 11/15/2012   Morbid obesity with BMI of 45.0-49.9, adult ([place]) 09/08/2012   Carpal tunnel syndrome 06/23/2011   Shoulder pain 09/02/2010   Sleep apnea 09/02/2010   GERD 12/24/2009   Urticaria/angioedema 12/16/2009   STRESS INCONTINENCE 01/24/2007   History of peptic ulcer disease 07/26/2006    REFERRING DIAG: M25.561,M25.562,G89.29 (ICD-10-CM) - Chronic pain of both knees M25.571,G89.29,M25.572 (ICD-10-CM) - Chronic pain of both ankles M54.50,G89.29 (ICD-10-CM) - Chronic left-sided low back pain without sciatica   THERAPY DIAG:  Other low back pain  Muscle weakness (generalized)  Rationale for Evaluation and Treatment Rehabilitation  PERTINENT HISTORY: Maria Nelson is a 67 yo in clinic today for follow-up of chronic conditions and first visit with me. She would also like to discuss chronic bilateral knee and ankle pain, as well as left lower back pain. Please see assessment/plan in problem-based charting for further details of today's visit.  PRECAUTIONS: None   SUBJECTIVE:                                                                                                                                                                                      SUBJECTIVE STATEMENT:  Back was pain free until she twisted in bed last night and felt a twinge in her low back   PAIN:  Are you having pain? No   OBJECTIVE: (objective measures completed at initial evaluation unless otherwise dated)   DIAGNOSTIC FINDINGS:  None available   PATIENT SURVEYS:  FOTO 54(67 predicted)   SCREENING FOR RED FLAGS: negative COGNITION: Overall cognitive  status: Within functional limits for tasks assessed                          SENSATION: Not tested   MUSCLE LENGTH: Hamstrings: Right 80 deg; Left 80 deg   POSTURE: not assessed   PALPATION: Not tested   LUMBAR ROM:    AROM eval  Flexion 50%  Extension 25%  Right lateral flexion 25%  Left lateral flexion 25%  Right rotation 25%  Left rotation 25%   (Blank rows = not tested)   LOWER EXTREMITY ROM:      Active  Right eval Left eval  Hip flexion 90d 90d  Hip extension      Hip abduction      Hip adduction      Hip internal rotation      Hip external rotation      Knee flexion      Knee extension      Ankle dorsiflexion      Ankle plantarflexion      Ankle inversion      Ankle eversion       (Blank rows = not tested)   LOWER EXTREMITY MMT:     MMT Right eval Left eval  Hip flexion 4 4  Hip extension 4 4  Hip abduction 4 4  Hip adduction      Hip internal rotation      Hip external rotation      Knee flexion 4 4  Knee extension 4 4  Ankle dorsiflexion      Ankle plantarflexion      Ankle inversion      Ankle eversion      core 3 3   (Blank rows = not tested)   LUMBAR SPECIAL TESTS:  Straight leg raise test: Negative and Slump test: Negative , unable to assess FABER/FADIR due to body habitus   FUNCTIONAL TESTS:  30 seconds chair stand test 4 reps   GAIT: Distance walked:  74fx2 Assistive device utilized: None Level of assistance: Complete Independence Comments: slow cadence and antalgic pattern   TODAY'S TREATMENT:    [facility] Adult PT Treatment:                                                DATE: 05/05/22 Therapeutic Exercise: Nustep L4 8 min Seated latissimus press 3s x15 Seated latissimus press with FAQs 15/15 Seated hip toss 6.6# 15x Seated chops 6.6# 15/15 Seated V's 6.6# 15x Seated hamstring stretch 30s x2 Bil Supine hip flexor stretch 30s x2 Bil Bridge w/5# ball 15x DKTC with 5# ball 15x 10x STS w/OH reach using 6.6# ball QL  stretch 30s x2 Bil  [facility] Adult PT Treatment:                                                DATE: 04/30/22 Therapeutic Exercise: Nustep L4 8 min Seated latissimus press 3s x15 Seated hip toss 5# 15x Seated chops 5# 15/15 Seated V's 5# 15x Seated hamstring stretch 30s x2 Bil Supine hip flexor stretch 30s x2 Bil 90/90 30s x2 Bridge w/5# ball 15x 10x STS w/OH reach using 5# ball     [facility] Adult PT Treatment:                                                DATE: 04/23/22 Therapeutic Exercise: Nustep L3 8 min Curl up 15x Supine march 15/15 2# SLR 15/15 2# 90/90 11, 17, 24s holds Supine hip fallouts GTB 15x Clams GTB 15x Bil Bridge w/ball 15x Standing heel raise against wall 15 Standing marching against wall 15/15 10x STS w/OH reach 5# ball                                                                                                                           DATE: 03/29/22 Eval and HEP      PATIENT EDUCATION:  Education details: Patient to demonstrate independence in HEP  Person educated: Patient Education method: Explanation Education comprehension: verbalized understanding and needs further education   HOME EXERCISE PROGRAM: Access Code: [email or web address]: [email or web address] Date: 03/29/2022 Prepared by: JSharlynn Oliphant  Exercises - Curl Up with Arms Crossed  - 2 x daily - 5 x weekly - 1 sets - 10 reps - Supine March  - 2 x daily - 5 x weekly - 1 sets - 10 reps - Sit to Stand with Armchair  - 2 x daily - 5 x weekly - 1 sets - 5 reps - Standing Heel Raise with Support  - 2 x  daily - 5 x weekly - 1 sets - 10 reps - Standing March with Counter Support  - 2 x daily - 5 x weekly - 1 sets - 10 reps   ASSESSMENT:   CLINICAL IMPRESSION: No increase in symptoms following last session with increased core work.  Continued stretching of LE's and trunk, increased difficulty of core tasks and added QL stretching.  Able to stretch L side of back with stretching tasks  today.   OBJECTIVE IMPAIRMENTS: Abnormal gait, decreased activity tolerance, decreased endurance, decreased knowledge of condition, decreased mobility, difficulty walking, decreased ROM, decreased strength, impaired flexibility, improper body mechanics, postural dysfunction, obesity, and pain.    ACTIVITY LIMITATIONS: carrying, lifting, bending, standing, squatting, sleeping, and stairs   PERSONAL FACTORS: Age, Fitness, and Time since onset of injury/illness/exacerbation are also affecting patient's functional outcome.    REHAB POTENTIAL: Good   CLINICAL DECISION MAKING: Stable/uncomplicated   EVALUATION COMPLEXITY: Low     GOALS: Goals reviewed with patient? No   SHORT TERM GOALS: Target date: 04/19/2022     Patient to demonstrate independence in HEP  Baseline:[id number] Goal status: INITIAL   2.  Increase 30s stand test to 5 reps Baseline: 4 reps  Goal status: INITIAL     LONG TERM GOALS: Target date: 05/10/2022     Increase FOTO score to 55 Baseline: 54 Goal status: INITIAL   2.  Increase core strength to 3+/5 Baseline: 3/5  Goal status: INITIAL   3.  Increase BLE strength to 4+/5 Baseline: 4/5 Goal status: INITIAL   4.  Increase all AROM lumbar spine to 50% Baseline:  AROM eval  Flexion 50%  Extension 25%  Right lateral flexion 25%  Left lateral flexion 25%  Right rotation 25%  Left rotation 25%    Goal status: INITIAL       PLAN:   PT FREQUENCY: 2x/week   PT DURATION: 6 weeks   PLANNED INTERVENTIONS: Therapeutic exercises, Therapeutic activity, Neuromuscular re-education, Balance training, Gait training, Patient/Family education, Self Care, Joint mobilization, Stair training, Aquatic Therapy, Dry Needling, Manual therapy, and Re-evaluation.   PLAN FOR NEXT SESSION: HEP review and update, stretching and flexibility tasks, core and LE strengthening   Lanice Shirts, PT 05/05/2022, 2:43 PM

## 2022-05-07 ENCOUNTER — Telehealth: Payer: Self-pay | Admitting: *Deleted

## 2022-05-07 NOTE — Telephone Encounter (Signed)
-----  Message from Dara Hoyer, FNP sent at 05/05/2022 10:30 AM EST ----- Hi Maria Nelson, I want to try to change this patient from [place] to Xolair injections for urticaria. Should help with urticaria and asthma too. Can you please see if Xolair will be covered for her? Thank you

## 2022-05-07 NOTE — Telephone Encounter (Signed)
Called patient and advised change from [place] to [place] for [facility] and submit to [facility]. Advised patient will reach out once delivery set to make appt to start therapy

## 2022-05-10 NOTE — Telephone Encounter (Signed)
Thank you Tammy!

## 2022-05-12 ENCOUNTER — Ambulatory Visit: Payer: 59

## 2022-05-12 DIAGNOSIS — M6281 Muscle weakness (generalized): Secondary | ICD-10-CM

## 2022-05-12 DIAGNOSIS — M5459 Other low back pain: Secondary | ICD-10-CM | POA: Diagnosis not present

## 2022-05-12 NOTE — Therapy (Signed)
OUTPATIENT PHYSICAL THERAPY TREATMENT NOTE   Patient Name: Maria Nelson MRN: [id number] DOB:Jul 06, 1955, 67 y.o., female Today's Date: 05/12/2022  PCP: Maria Killian, MD   REFERRING PROVIDER: Charise Killian, MD    END OF SESSION:   PT End of Session - 05/12/22 1358     Visit Number 6    Number of Visits 12    Date for PT Re-Evaluation 05/24/22    Authorization Type [employer]    PT Start Time 1400    PT Stop Time 1440    PT Time Calculation (min) 40 min    Activity Tolerance Patient tolerated treatment well    Behavior During Therapy [facility] for tasks assessed/performed              Past Medical History:  Diagnosis Date   ANGIOEDEMA 12/16/2009   Asthma    Carpal tunnel syndrome 06/23/2011   Patient notes history of bilateral carpal tunnel syndrome.  Now has symptoms of right hand carpal tunnel.    COPD (chronic obstructive pulmonary disease) ([place])    secondary to tobacco use // No PFTs on file   COVID-19    ETOH abuse    Pt stopped in 3/08   Gastrointestinal hemorrhage    secondary to PUD on March 2008   OSA on CPAP    noncompliant with CPAP (2/2 it being depressing)   Peptic ulcer disease    +H. pylori (antigen)- Dr. Olevia Nelson- treated   STRESS INCONTINENCE 01/24/2007   Tobacco abuse    quit in 2010   Urinary incontinence    Urticaria    Urticaria 07/01/2015   Past Surgical History:  Procedure Laterality Date   BREAST EXCISIONAL BIOPSY Right 1994   MANDIBLE SURGERY     due to trauma   TUBAL LIGATION     Patient Active Problem List   Diagnosis Date Noted   Stage 3a chronic kidney disease ([place]) 03/15/2022   Pulmonary nodule less than 6 mm determined by computed tomography of lung 12/03/2021   Low iron 12/03/2021   Benign paroxysmal positional vertigo 09/03/2021   Colon cancer screening 07/14/2021   Healthcare maintenance 01/28/2021   Hyperlipidemia 11/22/2019   Adnexal pain 09/06/2018   Rash 05/11/2018   Paroxysmal atrial fibrillation (HCC) 08/24/2017    Dyspepsia 08/24/2017   Chronic pain of both knees 08/16/2016   Back pain 06/23/2016   Urticaria 07/01/2015   Seasonal and perennial allergic rhinitis 03/04/2015   Asthma with COPD 06/19/2014   Blood pressure elevated without history of HTN 03/05/2014   HNP (herniated nucleus pulposus), lumbar 04/16/2013   Tenosynovitis, de Quervain 11/15/2012   Morbid obesity with BMI of 45.0-49.9, adult ([place]) 09/08/2012   Carpal tunnel syndrome 06/23/2011   Shoulder pain 09/02/2010   Sleep apnea 09/02/2010   GERD 12/24/2009   Urticaria/angioedema 12/16/2009   STRESS INCONTINENCE 01/24/2007   History of peptic ulcer disease 07/26/2006    REFERRING DIAG: M25.561,M25.562,G89.29 (ICD-10-CM) - Chronic pain of both knees M25.571,G89.29,M25.572 (ICD-10-CM) - Chronic pain of both ankles M54.50,G89.29 (ICD-10-CM) - Chronic left-sided low back pain without sciatica   THERAPY DIAG:  Other low back pain  Muscle weakness (generalized)  Rationale for Evaluation and Treatment Rehabilitation  PERTINENT HISTORY: Maria Nelson is a 67 yo in clinic today for follow-up of chronic conditions and first visit with me. She would also like to discuss chronic bilateral knee and ankle pain, as well as left lower back pain. Please see assessment/plan in problem-based charting for further details of today's visit.  PRECAUTIONS: None   SUBJECTIVE:                                                                                                                                                                                      SUBJECTIVE STATEMENT:  Report L sided back pain when lying on her L side   PAIN:  Are you having pain? No   OBJECTIVE: (objective measures completed at initial evaluation unless otherwise dated)   DIAGNOSTIC FINDINGS:  None available   PATIENT SURVEYS:  FOTO 57(66 predicted); 05/12/22 60   SCREENING FOR RED FLAGS: Negative  COGNITION: Overall cognitive status: Within functional  limits for tasks assessed                          SENSATION: Not tested   MUSCLE LENGTH: Hamstrings: Right 80 deg; Left 80 deg   POSTURE: not assessed   PALPATION: Not tested   LUMBAR ROM:    AROM eval  Flexion 50%  Extension 25%  Right lateral flexion 25%  Left lateral flexion 25%  Right rotation 25%  Left rotation 25%   (Blank rows = not tested)   LOWER EXTREMITY ROM:      Active  Right eval Left eval  Hip flexion 90d 90d  Hip extension      Hip abduction      Hip adduction      Hip internal rotation      Hip external rotation      Knee flexion      Knee extension      Ankle dorsiflexion      Ankle plantarflexion      Ankle inversion      Ankle eversion       (Blank rows = not tested)   LOWER EXTREMITY MMT:     MMT Right eval Left eval  Hip flexion 4 4  Hip extension 4 4  Hip abduction 4 4  Hip adduction      Hip internal rotation      Hip external rotation      Knee flexion 4 4  Knee extension 4 4  Ankle dorsiflexion      Ankle plantarflexion      Ankle inversion      Ankle eversion      core 3 3   (Blank rows = not tested)   LUMBAR SPECIAL TESTS:  Straight leg raise test: Negative and Slump test: Negative , unable to assess FABER/FADIR due to body habitus   FUNCTIONAL TESTS:  30 seconds chair stand test 4 reps   GAIT: Distance walked: 58fx2 Assistive device utilized: None  Level of assistance: Complete Independence Comments: slow cadence and antalgic pattern   TODAY'S TREATMENT:    [facility] Adult PT Treatment:                                                DATE: 05/12/22 Therapeutic Exercise: Nustep L4 8 min Seated hamstring stretch 30s x2 Bil Supine hip flexor stretch 30s x2 Bil Bridge w/5# ball 15x DKTC with 5# ball 15x QL stretch 30s x2 Bil Manual Therapy: Manual L psoas/iliacus stretch in prone 30s duration STM mobilization to L multifidi   [facility] Adult PT Treatment:                                                DATE:  05/05/22 Therapeutic Exercise: Nustep L4 8 min Seated latissimus press 3s x15 Seated latissimus press with FAQs 15/15 Seated hip toss 6.6# 15x Seated chops 6.6# 15/15 Seated V's 6.6# 15x Seated hamstring stretch 30s x2 Bil Supine hip flexor stretch 30s x2 Bil Bridge w/5# ball 15x DKTC with 5# ball 15x 10x STS w/OH reach using 6.6# ball QL stretch 30s x2 Bil  [facility] Adult PT Treatment:                                                DATE: 04/30/22 Therapeutic Exercise: Nustep L4 8 min Seated latissimus press 3s x15 Seated hip toss 5# 15x Seated chops 5# 15/15 Seated V's 5# 15x Seated hamstring stretch 30s x2 Bil Supine hip flexor stretch 30s x2 Bil 90/90 30s x2 Bridge w/5# ball 15x 10x STS w/OH reach using 5# ball     [facility] Adult PT Treatment:                                                DATE: 04/23/22 Therapeutic Exercise: Nustep L3 8 min Curl up 15x Supine march 15/15 2# SLR 15/15 2# 90/90 11, 17, 24s holds Supine hip fallouts GTB 15x Clams GTB 15x Bil Bridge w/ball 15x Standing heel raise against wall 15 Standing marching against wall 15/15 10x STS w/OH reach 5# ball                                                                                                                           DATE: 03/29/22 Eval and HEP      PATIENT EDUCATION:  Education details: Patient to demonstrate independence in HEP  Person educated: Patient Education method:  Explanation Education comprehension: verbalized understanding and needs further education   HOME EXERCISE PROGRAM: Access Code: [email or web address] URL: [email or web address] Date: 05/12/2022 Prepared by: Sharlynn Oliphant  Exercises - Curl Up with Arms Crossed  - 2 x daily - 5 x weekly - 1 sets - 10 reps - Supine Quadratus Lumborum Stretch  - 2 x daily - 5 x weekly - 1 sets - 2 reps - 30s hold - Supine Posterior Pelvic Tilt  - 2 x daily - 5 x weekly - 1 sets - 10 reps - 3s hold - Supine Bridge with Mini Swiss Ball  Between Knees  - 2 x daily - 5 x weekly - 1 sets - 10 reps   ASSESSMENT:   CLINICAL IMPRESSION: Continued aerobic and core strengthening.  30s chair stand goal met.  Added STM as well as manual stretching to address c/o L low back symptoms.  FOTO score improved but goal not met.    OBJECTIVE IMPAIRMENTS: Abnormal gait, decreased activity tolerance, decreased endurance, decreased knowledge of condition, decreased mobility, difficulty walking, decreased ROM, decreased strength, impaired flexibility, improper body mechanics, postural dysfunction, obesity, and pain.    ACTIVITY LIMITATIONS: carrying, lifting, bending, standing, squatting, sleeping, and stairs   PERSONAL FACTORS: Age, Fitness, and Time since onset of injury/illness/exacerbation are also affecting patient's functional outcome.    REHAB POTENTIAL: Good   CLINICAL DECISION MAKING: Stable/uncomplicated   EVALUATION COMPLEXITY: Low     GOALS: Goals reviewed with patient? No   SHORT TERM GOALS: Target date: 04/19/2022     Patient to demonstrate independence in [place] Goal status: Met   2.  Increase 30s stand test to 5 reps Baseline: 4 reps; 05/12/22 11 reps Goal status: Met     LONG TERM GOALS: Target date: 05/10/2022     Increase FOTO score to 67 Baseline: 54; 05/12/22 60 Goal status: Ongoing   2.  Increase core strength to 3+/5 Baseline: 3/5  Goal status: INITIAL   3.  Increase BLE strength to 4+/5 Baseline: 4/5 Goal status: INITIAL   4.  Increase all AROM lumbar spine to 50% Baseline:  AROM eval  Flexion 50%  Extension 25%  Right lateral flexion 25%  Left lateral flexion 25%  Right rotation 25%  Left rotation 25%    Goal status: INITIAL       PLAN:   PT FREQUENCY: 2x/week   PT DURATION: 6 weeks   PLANNED INTERVENTIONS: Therapeutic exercises, Therapeutic activity, Neuromuscular re-education, Balance training, Gait training, Patient/Family education, Self Care, Joint mobilization,  Stair training, Aquatic Therapy, Dry Needling, Manual therapy, and Re-evaluation.   PLAN FOR NEXT SESSION: HEP review and update, stretching and flexibility tasks, core and LE strengthening   Lanice Shirts, PT 05/12/2022, 3:47 PM

## 2022-05-14 ENCOUNTER — Ambulatory Visit: Payer: 59

## 2022-05-14 DIAGNOSIS — M6281 Muscle weakness (generalized): Secondary | ICD-10-CM

## 2022-05-14 DIAGNOSIS — M5459 Other low back pain: Secondary | ICD-10-CM | POA: Diagnosis not present

## 2022-05-14 NOTE — Therapy (Signed)
OUTPATIENT PHYSICAL THERAPY TREATMENT NOTE   Patient Name: Maria Nelson MRN: [id number] DOB:1956/02/09, 67 y.o., female Today's Date: 05/14/2022  PCP: Maria Killian, MD   REFERRING PROVIDER: Charise Killian, MD    END OF SESSION:      Past Medical History:  Diagnosis Date   ANGIOEDEMA 12/16/2009   Asthma    Carpal tunnel syndrome 06/23/2011   Patient notes history of bilateral carpal tunnel syndrome.  Now has symptoms of right hand carpal tunnel.    COPD (chronic obstructive pulmonary disease) ([place])    secondary to tobacco use // No PFTs on file   COVID-19    ETOH abuse    Pt stopped in 3/08   Gastrointestinal hemorrhage    secondary to PUD on March 2008   OSA on CPAP    noncompliant with CPAP (2/2 it being depressing)   Peptic ulcer disease    +H. pylori (antigen)- Dr. Olevia Nelson- treated   STRESS INCONTINENCE 01/24/2007   Tobacco abuse    quit in 2010   Urinary incontinence    Urticaria    Urticaria 07/01/2015   Past Surgical History:  Procedure Laterality Date   BREAST EXCISIONAL BIOPSY Right 1994   MANDIBLE SURGERY     due to trauma   TUBAL LIGATION     Patient Active Problem List   Diagnosis Date Noted   Stage 3a chronic kidney disease ([place]) 03/15/2022   Pulmonary nodule less than 6 mm determined by computed tomography of lung 12/03/2021   Low iron 12/03/2021   Benign paroxysmal positional vertigo 09/03/2021   Colon cancer screening 07/14/2021   Healthcare maintenance 01/28/2021   Hyperlipidemia 11/22/2019   Adnexal pain 09/06/2018   Rash 05/11/2018   Paroxysmal atrial fibrillation (HCC) 08/24/2017   Dyspepsia 08/24/2017   Chronic pain of both knees 08/16/2016   Back pain 06/23/2016   Urticaria 07/01/2015   Seasonal and perennial allergic rhinitis 03/04/2015   Asthma with COPD 06/19/2014   Blood pressure elevated without history of HTN 03/05/2014   HNP (herniated nucleus pulposus), lumbar 04/16/2013   Tenosynovitis, de Quervain 11/15/2012    Morbid obesity with BMI of 45.0-49.9, adult ([place]) 09/08/2012   Carpal tunnel syndrome 06/23/2011   Shoulder pain 09/02/2010   Sleep apnea 09/02/2010   GERD 12/24/2009   Urticaria/angioedema 12/16/2009   STRESS INCONTINENCE 01/24/2007   History of peptic ulcer disease 07/26/2006    REFERRING DIAG: M25.561,M25.562,G89.29 (ICD-10-CM) - Chronic pain of both knees M25.571,G89.29,M25.572 (ICD-10-CM) - Chronic pain of both ankles M54.50,G89.29 (ICD-10-CM) - Chronic left-sided low back pain without sciatica   THERAPY DIAG:  Other low back pain  Muscle weakness (generalized)  Rationale for Evaluation and Treatment Rehabilitation  PERTINENT HISTORY: [name] is a 67 yo in clinic today for follow-up of chronic conditions and first visit with me. She would also like to discuss chronic bilateral knee and ankle pain, as well as left lower back pain. Please see assessment/plan in problem-based charting for further details of today's visit.   PRECAUTIONS: None   SUBJECTIVE:  SUBJECTIVE STATEMENT:  Reports some contralateral R sided pain last night, took Tylenol and awoke pain free.   PAIN:  Are you having pain? No   OBJECTIVE: (objective measures completed at initial evaluation unless otherwise dated)   DIAGNOSTIC FINDINGS:  None available   PATIENT SURVEYS:  FOTO 57(66 predicted); 05/12/22 60   SCREENING FOR RED FLAGS: Negative  COGNITION: Overall cognitive status: Within functional limits for tasks assessed                          SENSATION: Not tested   MUSCLE LENGTH: Hamstrings: Right 80 deg; Left 80 deg   POSTURE: not assessed   PALPATION: Not tested   LUMBAR ROM:    AROM eval  Flexion 50%  Extension 25%  Right lateral flexion 25%  Left lateral flexion 25%  Right rotation 25%   Left rotation 25%   (Blank rows = not tested)   LOWER EXTREMITY ROM:      Active  Right eval Left eval  Hip flexion 90d 90d  Hip extension      Hip abduction      Hip adduction      Hip internal rotation      Hip external rotation      Knee flexion      Knee extension      Ankle dorsiflexion      Ankle plantarflexion      Ankle inversion      Ankle eversion       (Blank rows = not tested)   LOWER EXTREMITY MMT:     MMT Right eval Left eval  Hip flexion 4 4  Hip extension 4 4  Hip abduction 4 4  Hip adduction      Hip internal rotation      Hip external rotation      Knee flexion 4 4  Knee extension 4 4  Ankle dorsiflexion      Ankle plantarflexion      Ankle inversion      Ankle eversion      core 3 3   (Blank rows = not tested)   LUMBAR SPECIAL TESTS:  Straight leg raise test: Negative and Slump test: Negative , unable to assess FABER/FADIR due to body habitus   FUNCTIONAL TESTS:  30 seconds chair stand test 4 reps   GAIT: Distance walked: 51fx2 Assistive device utilized: None Level of assistance: Complete Independence Comments: slow cadence and antalgic pattern   TODAY'S TREATMENT:    [facility] Adult PT Treatment:                                                DATE: 05/14/22 Therapeutic Exercise: Nustep L5 8 min Seated hamstring stretch 30s x2 Bil Supine hip flexor stretch 30s x2 Bil Bridge w/5# ball 10x2 DKTC with 5# ball 10x2 QL stretch 30s x2 Bil Manual Therapy: Manual L psoas/iliacus stretch in prone 30s duration x2 knee over 1/2 roll for extension at hip STM mobilization to L multifidi  [facility]                                                [facility] Therapeutic  Exercise: Nustep L4 8 min Seated hamstring stretch 30s x2 Bil Supine hip flexor stretch 30s x2 Bil Bridge w/5# ball 15x DKTC with 5# ball 15x QL stretch 30s x2 Bil Manual Therapy: Manual L psoas/iliacus stretch in prone 30s duration STM mobilization to L  multifidi   [facility] Adult PT Treatment:                                                DATE: 05/05/22 Therapeutic Exercise: Nustep L4 8 min Seated latissimus press 3s x15 Seated latissimus press with FAQs 15/15 Seated hip toss 6.6# 15x Seated chops 6.6# 15/15 Seated V's 6.6# 15x Seated hamstring stretch 30s x2 Bil Supine hip flexor stretch 30s x2 Bil Bridge w/5# ball 15x DKTC with 5# ball 15x 10x STS w/OH reach using 6.6# ball QL stretch 30s x2 Bil  [facility] Adult PT Treatment:                                                DATE: 04/30/22 Therapeutic Exercise: Nustep L4 8 min Seated latissimus press 3s x15 Seated hip toss 5# 15x Seated chops 5# 15/15 Seated V's 5# 15x Seated hamstring stretch 30s x2 Bil Supine hip flexor stretch 30s x2 Bil 90/90 30s x2 Bridge w/5# ball 15x 10x STS w/OH reach using 5# ball     [facility] Adult PT Treatment:                                                DATE: 04/23/22 Therapeutic Exercise: Nustep L3 8 min Curl up 15x Supine march 15/15 2# SLR 15/15 2# 90/90 11, 17, 24s holds Supine hip fallouts GTB 15x Clams GTB 15x Bil Bridge w/ball 15x Standing heel raise against wall 15 Standing marching against wall 15/15 10x STS w/OH reach 5# ball                                                                                                                           DATE: 03/29/22 Eval and HEP      PATIENT EDUCATION:  Education details: Patient to demonstrate independence in HEP  Person educated: Patient Education method: Explanation Education comprehension: verbalized understanding and needs further education   HOME EXERCISE PROGRAM: Access Code: [email or web address] URL: [email or web address] Date: 05/12/2022 Prepared by: Sharlynn Oliphant  Exercises - Curl Up with Arms Crossed  - 2 x daily - 5 x weekly - 1 sets - 10 reps - Supine Quadratus Lumborum Stretch  - 2 x daily - 5 x weekly - 1 sets - 2 reps - 30s hold -  Supine Posterior Pelvic Tilt  - 2  x daily - 5 x weekly - 1 sets - 10 reps - 3s hold - Supine Bridge with Mini Swiss Ball Between Knees  - 2 x daily - 5 x weekly - 1 sets - 10 reps   ASSESSMENT:   CLINICAL IMPRESSION: Added resistance to aerobic exercise today as well as reps to exercises.  Continued stretching and increased intensity by extending L hip during stretch.  Performed STM to L multifidi   OBJECTIVE IMPAIRMENTS: Abnormal gait, decreased activity tolerance, decreased endurance, decreased knowledge of condition, decreased mobility, difficulty walking, decreased ROM, decreased strength, impaired flexibility, improper body mechanics, postural dysfunction, obesity, and pain.    ACTIVITY LIMITATIONS: carrying, lifting, bending, standing, squatting, sleeping, and stairs   PERSONAL FACTORS: Age, Fitness, and Time since onset of injury/illness/exacerbation are also affecting patient's functional outcome.    REHAB POTENTIAL: Good   CLINICAL DECISION MAKING: Stable/uncomplicated   EVALUATION COMPLEXITY: Low     GOALS: Goals reviewed with patient? No   SHORT TERM GOALS: Target date: 04/19/2022     Patient to demonstrate independence in [place] Goal status: Met   2.  Increase 30s stand test to 5 reps Baseline: 4 reps; 05/12/22 11 reps Goal status: Met     LONG TERM GOALS: Target date: 05/10/2022     Increase FOTO score to 67 Baseline: 54; 05/12/22 60 Goal status: Ongoing   2.  Increase core strength to 3+/5 Baseline: 3/5  Goal status: INITIAL   3.  Increase BLE strength to 4+/5 Baseline: 4/5 Goal status: INITIAL   4.  Increase all AROM lumbar spine to 50% Baseline:  AROM eval  Flexion 50%  Extension 25%  Right lateral flexion 25%  Left lateral flexion 25%  Right rotation 25%  Left rotation 25%    Goal status: INITIAL       PLAN:   PT FREQUENCY: 2x/week   PT DURATION: 6 weeks   PLANNED INTERVENTIONS: Therapeutic exercises, Therapeutic activity, Neuromuscular re-education,  Balance training, Gait training, Patient/Family education, Self Care, Joint mobilization, Stair training, Aquatic Therapy, Dry Needling, Manual therapy, and Re-evaluation.   PLAN FOR NEXT SESSION: HEP review and update, stretching and flexibility tasks, core and LE strengthening   Lanice Shirts, PT 05/14/2022, 2:15 PM

## 2022-05-19 ENCOUNTER — Ambulatory Visit: Payer: 59

## 2022-05-19 DIAGNOSIS — M6281 Muscle weakness (generalized): Secondary | ICD-10-CM | POA: Diagnosis not present

## 2022-05-19 DIAGNOSIS — M5459 Other low back pain: Secondary | ICD-10-CM | POA: Diagnosis not present

## 2022-05-19 NOTE — Therapy (Signed)
OUTPATIENT PHYSICAL THERAPY TREATMENT NOTE   Patient Name: Navaeh Kehres MRN: [id number] DOB:1955/07/09, 67 y.o., female Today's Date: 05/19/2022  PCP: Charise Killian, MD   REFERRING PROVIDER: Charise Killian, MD    END OF SESSION:   PT End of Session - 05/19/22 1349     Visit Number 8    Number of Visits 12    Date for PT Re-Evaluation 05/24/22    Authorization Type U[employer]    PT Start Time 1350    PT Stop Time 1430    PT Time Calculation (min) 40 min    Activity Tolerance Patient tolerated treatment well    Behavior During Therapy [facility] for tasks assessed/performed               Past Medical History:  Diagnosis Date   ANGIOEDEMA 12/16/2009   Asthma    Carpal tunnel syndrome 06/23/2011   Patient notes history of bilateral carpal tunnel syndrome.  Now has symptoms of right hand carpal tunnel.    COPD (chronic obstructive pulmonary disease) ([place])    secondary to tobacco use // No PFTs on file   COVID-19    ETOH abuse    Pt stopped in 3/08   Gastrointestinal hemorrhage    secondary to PUD on March 2008   OSA on CPAP    noncompliant with CPAP (2/2 it being depressing)   Peptic ulcer disease    +H. pylori (antigen)- Dr. Olevia Perches- treated   STRESS INCONTINENCE 01/24/2007   Tobacco abuse    quit in 2010   Urinary incontinence    Urticaria    Urticaria 07/01/2015   Past Surgical History:  Procedure Laterality Date   BREAST EXCISIONAL BIOPSY Right 1994   MANDIBLE SURGERY     due to trauma   TUBAL LIGATION     Patient Active Problem List   Diagnosis Date Noted   Stage 3a chronic kidney disease ([place]) 03/15/2022   Pulmonary nodule less than 6 mm determined by computed tomography of lung 12/03/2021   Low iron 12/03/2021   Benign paroxysmal positional vertigo 09/03/2021   Colon cancer screening 07/14/2021   Healthcare maintenance 01/28/2021   Hyperlipidemia 11/22/2019   Adnexal pain 09/06/2018   Rash 05/11/2018   Paroxysmal atrial fibrillation (HCC)  08/24/2017   Dyspepsia 08/24/2017   Chronic pain of both knees 08/16/2016   Back pain 06/23/2016   Urticaria 07/01/2015   Seasonal and perennial allergic rhinitis 03/04/2015   Asthma with COPD 06/19/2014   Blood pressure elevated without history of HTN 03/05/2014   HNP (herniated nucleus pulposus), lumbar 04/16/2013   Tenosynovitis, de Quervain 11/15/2012   Morbid obesity with BMI of 45.0-49.9, adult ([place]) 09/08/2012   Carpal tunnel syndrome 06/23/2011   Shoulder pain 09/02/2010   Sleep apnea 09/02/2010   GERD 12/24/2009   Urticaria/angioedema 12/16/2009   STRESS INCONTINENCE 01/24/2007   History of peptic ulcer disease 07/26/2006    REFERRING DIAG: M25.561,M25.562,G89.29 (ICD-10-CM) - Chronic pain of both knees M25.571,G89.29,M25.572 (ICD-10-CM) - Chronic pain of both ankles M54.50,G89.29 (ICD-10-CM) - Chronic left-sided low back pain without sciatica   THERAPY DIAG:  Other low back pain  Muscle weakness (generalized)  Rationale for Evaluation and Treatment Rehabilitation  PERTINENT HISTORY: Ms. Dutkiewicz is a 67 yo in clinic today for follow-up of chronic conditions and first visit with me. She would also like to discuss chronic bilateral knee and ankle pain, as well as left lower back pain. Please see assessment/plan in problem-based charting for further details of today's visit.  PRECAUTIONS: None   SUBJECTIVE:                                                                                                                                                                                      SUBJECTIVE STATEMENT:  Doing "great".  Pain rated at 5/10 and has returned to L side    PAIN:  Are you having pain? No   OBJECTIVE: (objective measures completed at initial evaluation unless otherwise dated)   DIAGNOSTIC FINDINGS:  None available   PATIENT SURVEYS:  FOTO 57(66 predicted); 05/12/22 60   SCREENING FOR RED FLAGS: Negative  COGNITION: Overall cognitive status:  Within functional limits for tasks assessed                          SENSATION: Not tested   MUSCLE LENGTH: Hamstrings: Right 80 deg; Left 80 deg   POSTURE: not assessed   PALPATION: Not tested   LUMBAR ROM:    AROM eval  Flexion 50%  Extension 25%  Right lateral flexion 25%  Left lateral flexion 25%  Right rotation 25%  Left rotation 25%   (Blank rows = not tested)   LOWER EXTREMITY ROM:      Active  Right eval Left eval  Hip flexion 90d 90d  Hip extension      Hip abduction      Hip adduction      Hip internal rotation      Hip external rotation      Knee flexion      Knee extension      Ankle dorsiflexion      Ankle plantarflexion      Ankle inversion      Ankle eversion       (Blank rows = not tested)   LOWER EXTREMITY MMT:     MMT Right eval Left eval  Hip flexion 4 4  Hip extension 4 4  Hip abduction 4 4  Hip adduction      Hip internal rotation      Hip external rotation      Knee flexion 4 4  Knee extension 4 4  Ankle dorsiflexion      Ankle plantarflexion      Ankle inversion      Ankle eversion      core 3 3   (Blank rows = not tested)   LUMBAR SPECIAL TESTS:  Straight leg raise test: Negative and Slump test: Negative , unable to assess FABER/FADIR due to body habitus   FUNCTIONAL TESTS:  30 seconds chair stand test 4 reps   GAIT: Distance walked: 96fx2 Assistive  device utilized: None Level of assistance: Complete Independence Comments: slow cadence and antalgic pattern   TODAY'S TREATMENT:    [facility] Adult PT Treatment:                                                DATE: 05/19/22 Therapeutic Exercise: Nustep L6 8 min Seated hamstring stretch 30s x2 Bil Supine hip flexor stretch 30s x2 Bil Bridge w/5# ball 10x2 90/90 30 sx2 Supine alternating march with PPT 30s x2(unable to maintain 30s duration) QL stretch 30s x2 Bil Seated latissimus press into foam roll 3s hold 15s Seated latissimus press w/alternating FAQs  15/15 Standing alternating shoulder flexion and opposite hip extension 10/10   [facility] Adult PT Treatment:                                                DATE: 05/14/22 Therapeutic Exercise: Nustep L5 8 min Seated hamstring stretch 30s x2 Bil Supine hip flexor stretch 30s x2 Bil Bridge w/5# ball 10x2 DKTC with 5# ball 10x2 QL stretch 30s x2 Bil Manual Therapy: Manual L psoas/iliacus stretch in prone 30s duration x2 knee over 1/2 roll for extension at hip STM mobilization to L multifidi  [facility] Adult PT Treatment:                                                DATE: 05/12/22 Therapeutic Exercise: Nustep L4 8 min Seated hamstring stretch 30s x2 Bil Supine hip flexor stretch 30s x2 Bil Bridge w/5# ball 15x DKTC with 5# ball 15x QL stretch 30s x2 Bil Manual Therapy: Manual L psoas/iliacus stretch in prone 30s duration STM mobilization to L multifidi                                                                                                                            DATE: 03/29/22 Eval and HEP      PATIENT EDUCATION:  Education details: Patient to demonstrate independence in HEP  Person educated: Patient Education method: Explanation Education comprehension: verbalized understanding and needs further education   HOME EXERCISE PROGRAM: Access Code: [email or web address] URL: [email or web address] Date: 05/12/2022 Prepared by: Sharlynn Oliphant  Exercises - Curl Up with Arms Crossed  - 2 x daily - 5 x weekly - 1 sets - 10 reps - Supine Quadratus Lumborum Stretch  - 2 x daily - 5 x weekly - 1 sets - 2 reps - 30s hold - Supine Posterior Pelvic Tilt  - 2 x daily - 5 x weekly - 1 sets - 10 reps - 3s hold -  Supine Bridge with Mini Swiss Ball Between Knees  - 2 x daily - 5 x weekly - 1 sets - 10 reps   ASSESSMENT:   CLINICAL IMPRESSION: Notes less pain/symptoms with prolonged sitting and rolling over in bed.  Continued stretching tasks and increased difficulty and challenge of  core/trunk strengthening, added standing UE/LE stretching tasks to mobilize posterior chain muscles and provide myofascial stertch.   OBJECTIVE IMPAIRMENTS: Abnormal gait, decreased activity tolerance, decreased endurance, decreased knowledge of condition, decreased mobility, difficulty walking, decreased ROM, decreased strength, impaired flexibility, improper body mechanics, postural dysfunction, obesity, and pain.    ACTIVITY LIMITATIONS: carrying, lifting, bending, standing, squatting, sleeping, and stairs   PERSONAL FACTORS: Age, Fitness, and Time since onset of injury/illness/exacerbation are also affecting patient's functional outcome.    REHAB POTENTIAL: Good   CLINICAL DECISION MAKING: Stable/uncomplicated   EVALUATION COMPLEXITY: Low     GOALS: Goals reviewed with patient? No   SHORT TERM GOALS: Target date: 04/19/2022     Patient to demonstrate independence in [place] Goal status: Met   2.  Increase 30s stand test to 5 reps Baseline: 4 reps; 05/12/22 11 reps Goal status: Met     LONG TERM GOALS: Target date: 05/10/2022     Increase FOTO score to 67 Baseline: 54; 05/12/22 60 Goal status: Ongoing   2.  Increase core strength to 3+/5 Baseline: 3/5  Goal status: INITIAL   3.  Increase BLE strength to 4+/5 Baseline: 4/5 Goal status: INITIAL   4.  Increase all AROM lumbar spine to 50% Baseline:  AROM eval  Flexion 50%  Extension 25%  Right lateral flexion 25%  Left lateral flexion 25%  Right rotation 25%  Left rotation 25%    Goal status: INITIAL       PLAN:   PT FREQUENCY: 2x/week   PT DURATION: 6 weeks   PLANNED INTERVENTIONS: Therapeutic exercises, Therapeutic activity, Neuromuscular re-education, Balance training, Gait training, Patient/Family education, Self Care, Joint mobilization, Stair training, Aquatic Therapy, Dry Needling, Manual therapy, and Re-evaluation.   PLAN FOR NEXT SESSION: HEP review and update, stretching and  flexibility tasks, core and LE strengthening   Lanice Shirts, PT 05/19/2022, 3:08 PM

## 2022-05-21 ENCOUNTER — Ambulatory Visit: Payer: 59 | Attending: Internal Medicine

## 2022-05-21 DIAGNOSIS — M6281 Muscle weakness (generalized): Secondary | ICD-10-CM | POA: Diagnosis not present

## 2022-05-21 DIAGNOSIS — M5459 Other low back pain: Secondary | ICD-10-CM | POA: Insufficient documentation

## 2022-05-21 NOTE — Therapy (Signed)
OUTPATIENT PHYSICAL THERAPY TREATMENT NOTE   Patient Name: Maria Nelson MRN: [id number] DOB:1955/12/01, 67 y.o., female Today's Date: 05/21/2022  PCP: Charise Killian, MD   REFERRING PROVIDER: Charise Killian, MD    END OF SESSION:   PT End of Session - 05/21/22 1331     Visit Number 9    Number of Visits 12    Date for PT Re-Evaluation 05/24/22    Authorization Type UHC    PT Start Time 1320    PT Stop Time 1400    PT Time Calculation (min) 40 min    Activity Tolerance Patient tolerated treatment well    Behavior During Therapy [place] for tasks assessed/performed                Past Medical History:  Diagnosis Date   ANGIOEDEMA 12/16/2009   Asthma    Carpal tunnel syndrome 06/23/2011   Patient notes history of bilateral carpal tunnel syndrome.  Now has symptoms of right hand carpal tunnel.    COPD (chronic obstructive pulmonary disease) ([place])    secondary to tobacco use // No PFTs on file   COVID-19    ETOH abuse    Pt stopped in 3/08   Gastrointestinal hemorrhage    secondary to PUD on March 2008   OSA on CPAP    noncompliant with CPAP (2/2 it being depressing)   Peptic ulcer disease    +H. pylori (antigen)- Dr. Olevia Perches- treated   STRESS INCONTINENCE 01/24/2007   Tobacco abuse    quit in 2010   Urinary incontinence    Urticaria    Urticaria 07/01/2015   Past Surgical History:  Procedure Laterality Date   BREAST EXCISIONAL BIOPSY Right 1994   MANDIBLE SURGERY     due to trauma   TUBAL LIGATION     Patient Active Problem List   Diagnosis Date Noted   Stage 3a chronic kidney disease ([place]) 03/15/2022   Pulmonary nodule less than 6 mm determined by computed tomography of lung 12/03/2021   Low iron 12/03/2021   Benign paroxysmal positional vertigo 09/03/2021   Colon cancer screening 07/14/2021   Healthcare maintenance 01/28/2021   Hyperlipidemia 11/22/2019   Adnexal pain 09/06/2018   Rash 05/11/2018   Paroxysmal atrial fibrillation (HCC)  08/24/2017   Dyspepsia 08/24/2017   Chronic pain of both knees 08/16/2016   Back pain 06/23/2016   Urticaria 07/01/2015   Seasonal and perennial allergic rhinitis 03/04/2015   Asthma with COPD 06/19/2014   Blood pressure elevated without history of HTN 03/05/2014   HNP (herniated nucleus pulposus), lumbar 04/16/2013   Tenosynovitis, de Quervain 11/15/2012   Morbid obesity with BMI of 45.0-49.9, adult ([place]) 09/08/2012   Carpal tunnel syndrome 06/23/2011   Shoulder pain 09/02/2010   Sleep apnea 09/02/2010   GERD 12/24/2009   Urticaria/angioedema 12/16/2009   STRESS INCONTINENCE 01/24/2007   History of peptic ulcer disease 07/26/2006    REFERRING DIAG: M25.561,M25.562,G89.29 (ICD-10-CM) - Chronic pain of both knees M25.571,G89.29,M25.572 (ICD-10-CM) - Chronic pain of both ankles M54.50,G89.29 (ICD-10-CM) - Chronic left-sided low back pain without sciatica   THERAPY DIAG:  Other low back pain  Muscle weakness (generalized)  Rationale for Evaluation and Treatment Rehabilitation  PERTINENT HISTORY: Ms. Pancake is a 67 yo in clinic today for follow-up of chronic conditions and first visit with me. She would also like to discuss chronic bilateral knee and ankle pain, as well as left lower back pain. Please see assessment/plan in problem-based charting for further details of today's  visit.   PRECAUTIONS: None   SUBJECTIVE:                                                                                                                                                                                      SUBJECTIVE STATEMENT:  Symptoms have localized to L gluteal region, aggravated by stairs and rolling over in bed.   PAIN:  Are you having pain? No   OBJECTIVE: (objective measures completed at initial evaluation unless otherwise dated)   DIAGNOSTIC FINDINGS:  None available   PATIENT SURVEYS:  FOTO 57(66 predicted); 05/12/22 60   SCREENING FOR RED  FLAGS: Negative  COGNITION: Overall cognitive status: Within functional limits for tasks assessed                          SENSATION: Not tested   MUSCLE LENGTH: Hamstrings: Right 80 deg; Left 80 deg   POSTURE: not assessed   PALPATION: Not tested   LUMBAR ROM:    AROM eval  Flexion 50%  Extension 25%  Right lateral flexion 25%  Left lateral flexion 25%  Right rotation 25%  Left rotation 25%   (Blank rows = not tested)   LOWER EXTREMITY ROM:      Active  Right eval Left eval  Hip flexion 90d 90d  Hip extension      Hip abduction      Hip adduction      Hip internal rotation      Hip external rotation      Knee flexion      Knee extension      Ankle dorsiflexion      Ankle plantarflexion      Ankle inversion      Ankle eversion       (Blank rows = not tested)   LOWER EXTREMITY MMT:     MMT Right eval Left eval  Hip flexion 4 4  Hip extension 4 4  Hip abduction 4 4  Hip adduction      Hip internal rotation      Hip external rotation      Knee flexion 4 4  Knee extension 4 4  Ankle dorsiflexion      Ankle plantarflexion      Ankle inversion      Ankle eversion      core 3 3   (Blank rows = not tested)   LUMBAR SPECIAL TESTS:  Straight leg raise test: Negative and Slump test: Negative , unable to assess FABER/FADIR due to body habitus   FUNCTIONAL TESTS:  30 seconds chair stand test 4 reps   GAIT:  Distance walked: 3fx2 Assistive device utilized: None Level of assistance: Complete Independence Comments: slow cadence and antalgic pattern   TODAY'S TREATMENT:    [facility] Adult PT Treatment:                                                DATE: 05/21/22 Therapeutic Exercise: Nustep L6 8 min Crossbody piriformis stretch 30s x2(strap used) Supine hip L flexor stretch 30s x2 Bil Bridge w/ball 15x2 90/90 30 sx2 L clams in R S/L 15x2 L abduction with extension bias 15x(to fatigue) Manual Therapy: L piriformis release 8 min sustained and varying  pressure   [facility] Adult PT Treatment:                                                DATE: 05/19/22 Therapeutic Exercise: Nustep L6 8 min Seated hamstring stretch 30s x2 Bil Supine hip flexor stretch 30s x2 Bil Bridge w/5# ball 10x2 90/90 30 sx2 Supine alternating march with PPT 30s x2(unable to maintain 30s duration) QL stretch 30s x2 Bil Seated latissimus press into foam roll 3s hold 15s Seated latissimus press w/alternating FAQs 15/15 Standing alternating shoulder flexion and opposite hip extension 10/10   [facility] Adult PT Treatment:                                                DATE: 05/14/22 Therapeutic Exercise: Nustep L5 8 min Seated hamstring stretch 30s x2 Bil Supine hip flexor stretch 30s x2 Bil Bridge w/5# ball 10x2 DKTC with 5# ball 10x2 QL stretch 30s x2 Bil Manual Therapy: Manual L psoas/iliacus stretch in prone 30s duration x2 knee over 1/2 roll for extension at hip STM mobilization to L multifidi  [facility] Adult PT Treatment:                                                DATE: 05/12/22 Therapeutic Exercise: Nustep L4 8 min Seated hamstring stretch 30s x2 Bil Supine hip flexor stretch 30s x2 Bil Bridge w/5# ball 15x DKTC with 5# ball 15x QL stretch 30s x2 Bil Manual Therapy: Manual L psoas/iliacus stretch in prone 30s duration STM mobilization to L multifidi                                                                                                                            DATE: 03/29/22 Eval and HEP      PATIENT EDUCATION:  Education details: Patient to demonstrate independence in HEP  Person educated: Patient Education method: Explanation Education comprehension: verbalized understanding and needs further education   HOME EXERCISE PROGRAM: Access Code: [email or web address] URL: [email or web address] Date: 05/12/2022 Prepared by: Sharlynn Oliphant  Exercises - Curl Up with Arms Crossed  - 2 x daily - 5 x weekly - 1 sets - 10 reps - Supine  Quadratus Lumborum Stretch  - 2 x daily - 5 x weekly - 1 sets - 2 reps - 30s hold - Supine Posterior Pelvic Tilt  - 2 x daily - 5 x weekly - 1 sets - 10 reps - 3s hold - Supine Bridge with Mini Swiss Ball Between Knees  - 2 x daily - 5 x weekly - 1 sets - 10 reps   ASSESSMENT:   CLINICAL IMPRESSION: Treatment focus shifted to L gluteal region as this is where her symptoms have localized to.  Added piriformis stretching and abduction strengthening.  Incorporate clams, isolated gluteus medius work and manual STM to L gluteal/piriformis.  L abduction to fatigue at 15 reps   OBJECTIVE IMPAIRMENTS: Abnormal gait, decreased activity tolerance, decreased endurance, decreased knowledge of condition, decreased mobility, difficulty walking, decreased ROM, decreased strength, impaired flexibility, improper body mechanics, postural dysfunction, obesity, and pain.    ACTIVITY LIMITATIONS: carrying, lifting, bending, standing, squatting, sleeping, and stairs   PERSONAL FACTORS: Age, Fitness, and Time since onset of injury/illness/exacerbation are also affecting patient's functional outcome.    REHAB POTENTIAL: Good   CLINICAL DECISION MAKING: Stable/uncomplicated   EVALUATION COMPLEXITY: Low     GOALS: Goals reviewed with patient? No   SHORT TERM GOALS: Target date: 04/19/2022     Patient to demonstrate independence in [place] Goal status: Met   2.  Increase 30s stand test to 5 reps Baseline: 4 reps; 05/12/22 11 reps Goal status: Met     LONG TERM GOALS: Target date: 05/10/2022     Increase FOTO score to 67 Baseline: 54; 05/12/22 60 Goal status: Ongoing   2.  Increase core strength to 3+/5 Baseline: 3/5  Goal status: INITIAL   3.  Increase BLE strength to 4+/5 Baseline: 4/5 Goal status: INITIAL   4.  Increase all AROM lumbar spine to 50% Baseline:  AROM eval  Flexion 50%  Extension 25%  Right lateral flexion 25%  Left lateral flexion 25%  Right rotation 25%   Left rotation 25%    Goal status: INITIAL       PLAN:   PT FREQUENCY: 2x/week   PT DURATION: 6 weeks   PLANNED INTERVENTIONS: Therapeutic exercises, Therapeutic activity, Neuromuscular re-education, Balance training, Gait training, Patient/Family education, Self Care, Joint mobilization, Stair training, Aquatic Therapy, Dry Needling, Manual therapy, and Re-evaluation.   PLAN FOR NEXT SESSION: HEP review and update, stretching and flexibility tasks, core and LE strengthening   Lanice Shirts, PT 05/21/2022, 2:02 PM

## 2022-05-24 ENCOUNTER — Ambulatory Visit (INDEPENDENT_AMBULATORY_CARE_PROVIDER_SITE_OTHER): Payer: 59

## 2022-05-24 DIAGNOSIS — L501 Idiopathic urticaria: Secondary | ICD-10-CM | POA: Diagnosis not present

## 2022-05-24 MED ORDER — BENRALIZUMAB 30 MG/ML ~~LOC~~ SOSY
30.0000 mg | PREFILLED_SYRINGE | Freq: Once | SUBCUTANEOUS | Status: AC
  Administered 2022-05-24: 30 mg via SUBCUTANEOUS

## 2022-05-24 MED ORDER — OMALIZUMAB 150 MG/ML ~~LOC~~ SOSY
300.0000 mg | PREFILLED_SYRINGE | Freq: Once | SUBCUTANEOUS | Status: AC
  Administered 2022-05-24: 300 mg via SUBCUTANEOUS

## 2022-05-24 MED ORDER — OMALIZUMAB 150 MG/ML ~~LOC~~ SOSY: 150.0000 mg | PREFILLED_SYRINGE | Freq: Once | SUBCUTANEOUS | Status: DC

## 2022-05-26 ENCOUNTER — Ambulatory Visit: Payer: 59

## 2022-05-26 DIAGNOSIS — M6281 Muscle weakness (generalized): Secondary | ICD-10-CM | POA: Diagnosis not present

## 2022-05-26 DIAGNOSIS — M5459 Other low back pain: Secondary | ICD-10-CM | POA: Diagnosis not present

## 2022-05-26 NOTE — Therapy (Signed)
OUTPATIENT PHYSICAL THERAPY TREATMENT NOTE   Patient Name: Maria Nelson MRN: [id number] DOB:07-05-1955, 67 y.o., female Today's Date: 05/26/2022  PCP: Charise Killian, MD   REFERRING PROVIDER: Charise Killian, MD    END OF SESSION:   PT End of Session - 05/26/22 1359     Visit Number 10    Number of Visits 12    Date for PT Re-Evaluation 05/24/22    Authorization Type U[employer]    PT Start Time 1400    PT Stop Time 1440    PT Time Calculation (min) 40 min    Activity Tolerance Patient tolerated treatment well    Behavior During Therapy [facility] for tasks assessed/performed                Past Medical History:  Diagnosis Date   ANGIOEDEMA 12/16/2009   Asthma    Carpal tunnel syndrome 06/23/2011   Patient notes history of bilateral carpal tunnel syndrome.  Now has symptoms of right hand carpal tunnel.    COPD (chronic obstructive pulmonary disease) ([place])    secondary to tobacco use // No PFTs on file   COVID-19    ETOH abuse    Pt stopped in 3/08   Gastrointestinal hemorrhage    secondary to PUD on March 2008   OSA on CPAP    noncompliant with CPAP (2/2 it being depressing)   Peptic ulcer disease    +H. pylori (antigen)- Dr. Olevia Perches- treated   STRESS INCONTINENCE 01/24/2007   Tobacco abuse    quit in 2010   Urinary incontinence    Urticaria    Urticaria 07/01/2015   Past Surgical History:  Procedure Laterality Date   BREAST EXCISIONAL BIOPSY Right 1994   MANDIBLE SURGERY     due to trauma   TUBAL LIGATION     Patient Active Problem List   Diagnosis Date Noted   Stage 3a chronic kidney disease ([place]) 03/15/2022   Pulmonary nodule less than 6 mm determined by computed tomography of lung 12/03/2021   Low iron 12/03/2021   Benign paroxysmal positional vertigo 09/03/2021   Colon cancer screening 07/14/2021   Healthcare maintenance 01/28/2021   Hyperlipidemia 11/22/2019   Adnexal pain 09/06/2018   Rash 05/11/2018   Paroxysmal atrial fibrillation (HCC)  08/24/2017   Dyspepsia 08/24/2017   Chronic pain of both knees 08/16/2016   Back pain 06/23/2016   Urticaria 07/01/2015   Seasonal and perennial allergic rhinitis 03/04/2015   Asthma with COPD 06/19/2014   Blood pressure elevated without history of HTN 03/05/2014   HNP (herniated nucleus pulposus), lumbar 04/16/2013   Tenosynovitis, de Quervain 11/15/2012   Morbid obesity with BMI of 45.0-49.9, adult ([place]) 09/08/2012   Carpal tunnel syndrome 06/23/2011   Shoulder pain 09/02/2010   Sleep apnea 09/02/2010   GERD 12/24/2009   Urticaria/angioedema 12/16/2009   STRESS INCONTINENCE 01/24/2007   History of peptic ulcer disease 07/26/2006    REFERRING DIAG: M25.561,M25.562,G89.29 (ICD-10-CM) - Chronic pain of both knees M25.571,G89.29,M25.572 (ICD-10-CM) - Chronic pain of both ankles M54.50,G89.29 (ICD-10-CM) - Chronic left-sided low back pain without sciatica   THERAPY DIAG:  Other low back pain  Muscle weakness (generalized)  Rationale for Evaluation and Treatment Rehabilitation  PERTINENT HISTORY: Ms. Vassell is a 67 yo in clinic today for follow-up of chronic conditions and first visit with me. She would also like to discuss chronic bilateral knee and ankle pain, as well as left lower back pain. Please see assessment/plan in problem-based charting for further details of today's  visit.   PRECAUTIONS: None   SUBJECTIVE:                                                                                                                                                                                      SUBJECTIVE STATEMENT:  Back pain minimal but reports a return of vertigo symptoms.   PAIN:  Are you having pain? No   OBJECTIVE: (objective measures completed at initial evaluation unless otherwise dated)   DIAGNOSTIC FINDINGS:  None available   PATIENT SURVEYS:  FOTO 57(66 predicted); 05/12/22 60   SCREENING FOR RED FLAGS: Negative  COGNITION: Overall cognitive status:  Within functional limits for tasks assessed                          SENSATION: Not tested   MUSCLE LENGTH: Hamstrings: Right 80 deg; Left 80 deg   POSTURE: not assessed   PALPATION: Not tested   LUMBAR ROM:    AROM eval  Flexion 50%  Extension 25%  Right lateral flexion 25%  Left lateral flexion 25%  Right rotation 25%  Left rotation 25%   (Blank rows = not tested)   LOWER EXTREMITY ROM:      Active  Right eval Left eval  Hip flexion 90d 90d  Hip extension      Hip abduction      Hip adduction      Hip internal rotation      Hip external rotation      Knee flexion      Knee extension      Ankle dorsiflexion      Ankle plantarflexion      Ankle inversion      Ankle eversion       (Blank rows = not tested)   LOWER EXTREMITY MMT:     MMT Right eval Left eval  Hip flexion 4 4  Hip extension 4 4  Hip abduction 4 4  Hip adduction      Hip internal rotation      Hip external rotation      Knee flexion 4 4  Knee extension 4 4  Ankle dorsiflexion      Ankle plantarflexion      Ankle inversion      Ankle eversion      core 3 3   (Blank rows = not tested)   LUMBAR SPECIAL TESTS:  Straight leg raise test: Negative and Slump test: Negative , unable to assess FABER/FADIR due to body habitus   FUNCTIONAL TESTS:  30 seconds chair stand test 4 reps   GAIT: Distance walked: 31fx2 Assistive device  utilized: None Level of assistance: Complete Independence Comments: slow cadence and antalgic pattern   TODAY'S TREATMENT:    [facility] Adult PT Treatment:                                                DATE: 05/21/22 Therapeutic Exercise: Nustep L6 8 min Crossbody piriformis stretch 30s x2(strap used) Bil Supine hip L flexor stretch 30s x2 Bridge w/ball 15x2 90/90 30 sx2 L clams in R S/L 15x2 L abduction with extension bias 15x(to fatigue) Self Care:  Review of vertigo HEP especially Brandt-Daroff  [facility] Adult PT Treatment:                                                 DATE: 05/19/22 Therapeutic Exercise: Nustep L6 8 min Seated hamstring stretch 30s x2 Bil Supine hip flexor stretch 30s x2 Bil Bridge w/5# ball 10x2 90/90 30 sx2 Supine alternating march with PPT 30s x2(unable to maintain 30s duration) QL stretch 30s x2 Bil Seated latissimus press into foam roll 3s hold 15s Seated latissimus press w/alternating FAQs 15/15 Standing alternating shoulder flexion and opposite hip extension 10/10   [facility] Adult PT Treatment:                                                DATE: 05/14/22 Therapeutic Exercise: Nustep L5 8 min Seated hamstring stretch 30s x2 Bil Supine hip flexor stretch 30s x2 Bil Bridge w/5# ball 10x2 DKTC with 5# ball 10x2 QL stretch 30s x2 Bil Manual Therapy: Manual L psoas/iliacus stretch in prone 30s duration x2 knee over 1/2 roll for extension at hip STM mobilization to L multifidi  [facility] Adult PT Treatment:                                                DATE: 05/12/22 Therapeutic Exercise: Nustep L4 8 min Seated hamstring stretch 30s x2 Bil Supine hip flexor stretch 30s x2 Bil Bridge w/5# ball 15x DKTC with 5# ball 15x QL stretch 30s x2 Bil Manual Therapy: Manual L psoas/iliacus stretch in prone 30s duration STM mobilization to L multifidi                                                                                                                            DATE: 03/29/22 Eval and HEP      PATIENT EDUCATION:  Education details: Patient to demonstrate independence in HEP  Person educated: Patient Education method: Explanation Education comprehension: verbalized understanding and needs further education   HOME EXERCISE PROGRAM: Access Code: [email or web address] URL: [email or web address] Date: 05/12/2022 Prepared by: Sharlynn Oliphant  Exercises - Curl Up with Arms Crossed  - 2 x daily - 5 x weekly - 1 sets - 10 reps - Supine Quadratus Lumborum Stretch  - 2 x daily - 5 x weekly - 1 sets - 2 reps - 30s  hold - Supine Posterior Pelvic Tilt  - 2 x daily - 5 x weekly - 1 sets - 10 reps - 3s hold - Supine Bridge with Mini Swiss Ball Between Knees  - 2 x daily - 5 x weekly - 1 sets - 10 reps   ASSESSMENT:   CLINICAL IMPRESSION: Due to onset of vertigo symptoms, reviewed previous HEP and re-issued Brandt-Daroff exercises with patient symptomatic to R.  Continued stretch and core strengthening tasks with patient able to hold 90/90 position for 30s duration today.  Today's session proceeded with caution so as to not exacerbate vertigo symptoms with position changes.   OBJECTIVE IMPAIRMENTS: Abnormal gait, decreased activity tolerance, decreased endurance, decreased knowledge of condition, decreased mobility, difficulty walking, decreased ROM, decreased strength, impaired flexibility, improper body mechanics, postural dysfunction, obesity, and pain.    ACTIVITY LIMITATIONS: carrying, lifting, bending, standing, squatting, sleeping, and stairs   PERSONAL FACTORS: Age, Fitness, and Time since onset of injury/illness/exacerbation are also affecting patient's functional outcome.    REHAB POTENTIAL: Good   CLINICAL DECISION MAKING: Stable/uncomplicated   EVALUATION COMPLEXITY: Low     GOALS: Goals reviewed with patient? No   SHORT TERM GOALS: Target date: 04/19/2022     Patient to demonstrate independence in [place] Goal status: Met   2.  Increase 30s stand test to 5 reps Baseline: 4 reps; 05/12/22 11 reps Goal status: Met     LONG TERM GOALS: Target date: 05/10/2022     Increase FOTO score to 67 Baseline: 54; 05/12/22 60 Goal status: Ongoing   2.  Increase core strength to 3+/5 Baseline: 3/5  Goal status: INITIAL   3.  Increase BLE strength to 4+/5 Baseline: 4/5 Goal status: INITIAL   4.  Increase all AROM lumbar spine to 50% Baseline:  AROM eval  Flexion 50%  Extension 25%  Right lateral flexion 25%  Left lateral flexion 25%  Right rotation 25%  Left rotation  25%    Goal status: INITIAL       PLAN:   PT FREQUENCY: 2x/week   PT DURATION: 6 weeks   PLANNED INTERVENTIONS: Therapeutic exercises, Therapeutic activity, Neuromuscular re-education, Balance training, Gait training, Patient/Family education, Self Care, Joint mobilization, Stair training, Aquatic Therapy, Dry Needling, Manual therapy, and Re-evaluation.   PLAN FOR NEXT SESSION: HEP review and update, stretching and flexibility tasks, core and LE strengthening   Lanice Shirts, PT 05/26/2022, 2:36 PM

## 2022-05-28 ENCOUNTER — Ambulatory Visit: Payer: 59

## 2022-05-28 DIAGNOSIS — M5459 Other low back pain: Secondary | ICD-10-CM

## 2022-05-28 DIAGNOSIS — M6281 Muscle weakness (generalized): Secondary | ICD-10-CM | POA: Diagnosis not present

## 2022-05-28 NOTE — Therapy (Signed)
OUTPATIENT PHYSICAL THERAPY TREATMENT NOTE/DC SUMMARY   Patient Name: Maria Nelson MRN: [id number] DOB:Aug 17, 1955, 67 y.o., female Today's Date: 05/28/2022  PCP: Charise Killian, MD   REFERRING PROVIDER: Charise Killian, MD   PHYSICAL THERAPY DISCHARGE SUMMARY  Visits from Start of Care: 11  Current functional level related to goals / functional outcomes: Goals met   Remaining deficits: weakness   Education / Equipment: HEP   Patient agrees to discharge. Patient goals were met. Patient is being discharged due to being pleased with the current functional level.  END OF SESSION:   PT End of Session - 05/28/22 1349     Visit Number 11    Number of Visits 12    Date for PT Re-Evaluation 05/24/22    Authorization Type UHC    PT Start Time [id number]    PT Stop Time 1430    PT Time Calculation (min) 45 min    Activity Tolerance Patient tolerated treatment well    Behavior During Therapy [facility] for tasks assessed/performed                Past Medical History:  Diagnosis Date   ANGIOEDEMA 12/16/2009   Asthma    Carpal tunnel syndrome 06/23/2011   Patient notes history of bilateral carpal tunnel syndrome.  Now has symptoms of right hand carpal tunnel.    COPD (chronic obstructive pulmonary disease) ([place])    secondary to tobacco use // No PFTs on file   COVID-19    ETOH abuse    Pt stopped in 3/08   Gastrointestinal hemorrhage    secondary to PUD on March 2008   OSA on CPAP    noncompliant with CPAP (2/2 it being depressing)   Peptic ulcer disease    +H. pylori (antigen)- Dr. Olevia Perches- treated   STRESS INCONTINENCE 01/24/2007   Tobacco abuse    quit in 2010   Urinary incontinence    Urticaria    Urticaria 07/01/2015   Past Surgical History:  Procedure Laterality Date   BREAST EXCISIONAL BIOPSY Right 1994   MANDIBLE SURGERY     due to trauma   TUBAL LIGATION     Patient Active Problem List   Diagnosis Date Noted   Stage 3a chronic kidney disease ([place])  03/15/2022   Pulmonary nodule less than 6 mm determined by computed tomography of lung 12/03/2021   Low iron 12/03/2021   Benign paroxysmal positional vertigo 09/03/2021   Colon cancer screening 07/14/2021   Healthcare maintenance 01/28/2021   Hyperlipidemia 11/22/2019   Adnexal pain 09/06/2018   Rash 05/11/2018   Paroxysmal atrial fibrillation (HCC) 08/24/2017   Dyspepsia 08/24/2017   Chronic pain of both knees 08/16/2016   Back pain 06/23/2016   Urticaria 07/01/2015   Seasonal and perennial allergic rhinitis 03/04/2015   Asthma with COPD 06/19/2014   Blood pressure elevated without history of HTN 03/05/2014   HNP (herniated nucleus pulposus), lumbar 04/16/2013   Tenosynovitis, de Quervain 11/15/2012   Morbid obesity with BMI of 45.0-49.9, adult ([place]) 09/08/2012   Carpal tunnel syndrome 06/23/2011   Shoulder pain 09/02/2010   Sleep apnea 09/02/2010   GERD 12/24/2009   Urticaria/angioedema 12/16/2009   STRESS INCONTINENCE 01/24/2007   History of peptic ulcer disease 07/26/2006    REFERRING DIAG: M25.561,M25.562,G89.29 (ICD-10-CM) - Chronic pain of both knees M25.571,G89.29,M25.572 (ICD-10-CM) - Chronic pain of both ankles M54.50,G89.29 (ICD-10-CM) - Chronic left-sided low back pain without sciatica   THERAPY DIAG:  Other low back pain  Muscle weakness (generalized)  Rationale for Evaluation and Treatment Rehabilitation  PERTINENT HISTORY: Maria Nelson is a 67 yo in clinic today for follow-up of chronic conditions and first visit with me. She would also like to discuss chronic bilateral knee and ankle pain, as well as left lower back pain. Please see assessment/plan in problem-based charting for further details of today's visit.   PRECAUTIONS: None   SUBJECTIVE:                                                                                                                                                                                      SUBJECTIVE STATEMENT:  Back pain  resolved for now, vertigo symptoms persist with minimal benefit from resumption of previous treatment interventions,  Agreeable to BPPV consult   PAIN:  Are you having pain? No   OBJECTIVE: (objective measures completed at initial evaluation unless otherwise dated)   DIAGNOSTIC FINDINGS:  None available   PATIENT SURVEYS:  FOTO 57(66 predicted); 05/12/22 60   SCREENING FOR RED FLAGS: Negative  COGNITION: Overall cognitive status: Within functional limits for tasks assessed                          SENSATION: Not tested   MUSCLE LENGTH: Hamstrings: Right 80 deg; Left 80 deg   POSTURE: not assessed   PALPATION: Not tested   LUMBAR ROM:    AROM eval 05/28/22  Flexion 50% 75%  Extension 25% 75%  Right lateral flexion 25% 75%  Left lateral flexion 25% 75%  Right rotation 25% 75%  Left rotation 25% 75%   (Blank rows = not tested)   LOWER EXTREMITY ROM:      Active  Right eval Left eval  Hip flexion 90d 90d  Hip extension      Hip abduction      Hip adduction      Hip internal rotation      Hip external rotation      Knee flexion      Knee extension      Ankle dorsiflexion      Ankle plantarflexion      Ankle inversion      Ankle eversion       (Blank rows = not tested)   LOWER EXTREMITY MMT:     MMT Right eval Left eval B 05/28/22  Hip flexion 4 4 4+  Hip extension 4 4 4+  Hip abduction 4 4 4+  Hip adduction       Hip internal rotation       Hip external rotation       Knee flexion 4 4 4+  Knee  extension 4 4 4+  Ankle dorsiflexion       Ankle plantarflexion       Ankle inversion       Ankle eversion       core 3 3 3+   (Blank rows = not tested)   LUMBAR SPECIAL TESTS:  Straight leg raise test: Negative and Slump test: Negative , unable to assess FABER/FADIR due to body habitus   FUNCTIONAL TESTS:  30 seconds chair stand test 4 reps   GAIT: Distance walked: 45fx2 Assistive device utilized: None Level of assistance: Complete  Independence Comments: slow cadence and antalgic pattern   TODAY'S TREATMENT:    [facility] Adult PT Treatment:                                                DATE: 05/28/22 Therapeutic Exercise: Nustep L6 8 min Crossbody piriformis stretch 30s x2(strap used) Bil Supine hip L flexor stretch 30s x2 Bridge w/ball 15x2 90/90 30 sx2 L clams in R S/L 15x2 L abduction with extension bias 15x(to fatigue)  [facility] Adult PT Treatment:                                                DATE: 05/21/22 Therapeutic Exercise: Nustep L6 8 min Crossbody piriformis stretch 30s x2(strap used) Bil Supine hip L flexor stretch 30s x2 Bridge w/ball 15x2 90/90 30 sx2 L clams in R S/L 15x2 L abduction with extension bias 15x(to fatigue) Self Care:  Review of vertigo HEP especially Brandt-Daroff  [facility] Adult PT Treatment:                                                DATE: 05/19/22 Therapeutic Exercise: Nustep L6 8 min Seated hamstring stretch 30s x2 Bil Supine hip flexor stretch 30s x2 Bil Bridge w/5# ball 10x2 90/90 30 sx2 Supine alternating march with PPT 30s x2(unable to maintain 30s duration) QL stretch 30s x2 Bil Seated latissimus press into foam roll 3s hold 15s Seated latissimus press w/alternating FAQs 15/15 Standing alternating shoulder flexion and opposite hip extension 10/10                                                                                                                             DATE: 03/29/22 Eval and HEP      PATIENT EDUCATION:  Education details: Patient to demonstrate independence in HEP  Person educated: Patient Education method: Explanation Education comprehension: verbalized understanding and needs further education   HOME EXERCISE PROGRAM: Access Code: [email or web address]: [email or web address] Date: 05/12/2022 Prepared  by: Sharlynn Oliphant  Exercises - Curl Up with Arms Crossed  - 2 x daily - 5 x weekly - 1 sets - 10 reps - Supine Quadratus Lumborum  Stretch  - 2 x daily - 5 x weekly - 1 sets - 2 reps - 30s hold - Supine Posterior Pelvic Tilt  - 2 x daily - 5 x weekly - 1 sets - 10 reps - 3s hold - Supine Bridge with Mini Swiss Ball Between Knees  - 2 x daily - 5 x weekly - 1 sets - 10 reps   ASSESSMENT:   CLINICAL IMPRESSION: Requested vertigo referral due to unrelenting symptoms with HEP.  Rehab goals for low back met and patient confident to DC to self management    OBJECTIVE IMPAIRMENTS: Abnormal gait, decreased activity tolerance, decreased endurance, decreased knowledge of condition, decreased mobility, difficulty walking, decreased ROM, decreased strength, impaired flexibility, improper body mechanics, postural dysfunction, obesity, and pain.    ACTIVITY LIMITATIONS: carrying, lifting, bending, standing, squatting, sleeping, and stairs   PERSONAL FACTORS: Age, Fitness, and Time since onset of injury/illness/exacerbation are also affecting patient's functional outcome.    REHAB POTENTIAL: Good   CLINICAL DECISION MAKING: Stable/uncomplicated   EVALUATION COMPLEXITY: Low     GOALS: Goals reviewed with patient? No   SHORT TERM GOALS: Target date: 04/19/2022     Patient to demonstrate independence in [place] Goal status: Met   2.  Increase 30s stand test to 5 reps Baseline: 4 reps; 05/12/22 11 reps Goal status: Met     LONG TERM GOALS: Target date: 05/10/2022     Increase FOTO score to 67 Baseline: 54; 05/12/22 60 Goal status: Not met   2.  Increase core strength to 3+/5 Baseline: 3/5; 05/28/22 3+/5 Goal status: Met   3.  Increase BLE strength to 4+/5 Baseline:  MMT Right eval Left eval B 05/28/22  Hip flexion 4 4 4+  Hip extension 4 4 4+  Hip abduction 4 4 4+  Hip adduction       Hip internal rotation       Hip external rotation       Knee flexion 4 4 4+  Knee extension 4 4 4+  Ankle dorsiflexion       Ankle plantarflexion       Ankle inversion       Ankle eversion       core 3 3 3+    Goal status: Met   4.  Increase all AROM lumbar spine to 50% Baseline:  AROM eval 05/28/22  Flexion 50% 75%  Extension 25% 75%  Right lateral flexion 25% 75%  Left lateral flexion 25% 75%  Right rotation 25% 75%  Left rotation 25% 75%    Goal status: Met       PLAN:   PT FREQUENCY: 2x/week   PT DURATION: 6 weeks   PLANNED INTERVENTIONS: Therapeutic exercises, Therapeutic activity, Neuromuscular re-education, Balance training, Gait training, Patient/Family education, Self Care, Joint mobilization, Stair training, Aquatic Therapy, Dry Needling, Manual therapy, and Re-evaluation.   PLAN FOR NEXT SESSION: DC to HEP   Lanice Shirts, PT 05/28/2022, 1:54 PM

## 2022-06-08 MED ORDER — OMALIZUMAB 150 MG/ML ~~LOC~~ SOSY
300.0000 mg | PREFILLED_SYRINGE | SUBCUTANEOUS | Status: AC
  Administered 2022-05-24 – 2024-05-04 (×26): 300 mg via SUBCUTANEOUS

## 2022-06-08 NOTE — Addendum Note (Signed)
Addended by: Carin Hock on: 06/08/2022 05:02 PM   Modules accepted: Orders

## 2022-06-21 ENCOUNTER — Ambulatory Visit (INDEPENDENT_AMBULATORY_CARE_PROVIDER_SITE_OTHER): Payer: 59 | Admitting: *Deleted

## 2022-06-21 DIAGNOSIS — L501 Idiopathic urticaria: Secondary | ICD-10-CM | POA: Diagnosis not present

## 2022-06-23 ENCOUNTER — Other Ambulatory Visit: Payer: Self-pay | Admitting: Internal Medicine

## 2022-07-05 ENCOUNTER — Telehealth: Payer: Self-pay

## 2022-07-05 ENCOUNTER — Ambulatory Visit (INDEPENDENT_AMBULATORY_CARE_PROVIDER_SITE_OTHER): Payer: 59 | Admitting: Internal Medicine

## 2022-07-05 ENCOUNTER — Other Ambulatory Visit: Payer: Self-pay

## 2022-07-05 ENCOUNTER — Other Ambulatory Visit: Payer: Self-pay | Admitting: Internal Medicine

## 2022-07-05 ENCOUNTER — Encounter: Payer: Self-pay | Admitting: Internal Medicine

## 2022-07-05 VITALS — BP 122/76 | HR 58 | Temp 98.0°F | Ht 60.0 in | Wt 256.2 lb

## 2022-07-05 DIAGNOSIS — E611 Iron deficiency: Secondary | ICD-10-CM

## 2022-07-05 DIAGNOSIS — J4489 Other specified chronic obstructive pulmonary disease: Secondary | ICD-10-CM | POA: Diagnosis not present

## 2022-07-05 DIAGNOSIS — R5383 Other fatigue: Secondary | ICD-10-CM

## 2022-07-05 DIAGNOSIS — E559 Vitamin D deficiency, unspecified: Secondary | ICD-10-CM

## 2022-07-05 DIAGNOSIS — Z1211 Encounter for screening for malignant neoplasm of colon: Secondary | ICD-10-CM

## 2022-07-05 DIAGNOSIS — M25562 Pain in left knee: Secondary | ICD-10-CM

## 2022-07-05 DIAGNOSIS — G8929 Other chronic pain: Secondary | ICD-10-CM

## 2022-07-05 DIAGNOSIS — I700 Atherosclerosis of aorta: Secondary | ICD-10-CM

## 2022-07-05 DIAGNOSIS — N1831 Chronic kidney disease, stage 3a: Secondary | ICD-10-CM | POA: Diagnosis not present

## 2022-07-05 DIAGNOSIS — Z6841 Body Mass Index (BMI) 40.0 and over, adult: Secondary | ICD-10-CM

## 2022-07-05 DIAGNOSIS — R911 Solitary pulmonary nodule: Secondary | ICD-10-CM

## 2022-07-05 DIAGNOSIS — I480 Paroxysmal atrial fibrillation: Secondary | ICD-10-CM | POA: Diagnosis not present

## 2022-07-05 DIAGNOSIS — M25561 Pain in right knee: Secondary | ICD-10-CM

## 2022-07-05 DIAGNOSIS — B353 Tinea pedis: Secondary | ICD-10-CM

## 2022-07-05 DIAGNOSIS — Z1231 Encounter for screening mammogram for malignant neoplasm of breast: Secondary | ICD-10-CM

## 2022-07-05 DIAGNOSIS — G4733 Obstructive sleep apnea (adult) (pediatric): Secondary | ICD-10-CM | POA: Diagnosis not present

## 2022-07-05 MED ORDER — CLOTRIMAZOLE 1 % EX OINT: TOPICAL_OINTMENT | CUTANEOUS | 0 refills | Status: DC

## 2022-07-05 MED ORDER — ALBUTEROL SULFATE (2.5 MG/3ML) 0.083% IN NEBU: INHALATION_SOLUTION | RESPIRATORY_TRACT | 3 refills | Status: DC

## 2022-07-05 NOTE — Patient Instructions (Addendum)
It was wonderful to see you today!  I have sent a new referral to GI for colonoscopy. Phone number is [phone].  Please start using the clotrimazole ointment for your right foot itching.  I will call with lab results!  Please contact your pharmacy about scheduling the shingles (Shingrix), tetanus (Tdap), and updated COVID-19 vaccines.

## 2022-07-05 NOTE — Progress Notes (Unsigned)
Established Patient Office Visit  Subjective   Patient ID: Shahidah Hanif, female    DOB: 1956/03/09  Age: 67 y.o. MRN: [id number]  Chief Complaint  Patient presents with   Follow-up    Knee pain better.    Ms. Lafauci returns to clinic for follow-up of chronic conditions as well as to discuss generalized fatigue and SOB. Please see assessment/plan in problem-based charting for further details of today's visit.     Patient Active Problem List   Diagnosis Date Noted   Aortic atherosclerosis ([place]) 07/06/2022   Encounter for screening mammogram for malignant neoplasm of breast 07/06/2022   Tinea pedis of right foot 07/06/2022   Encounter for screening colonoscopy 07/06/2022   Other fatigue 07/06/2022   Stage 3a chronic kidney disease ([place]) 03/15/2022   Pulmonary nodule less than 6 mm determined by computed tomography of lung 12/03/2021   Low iron 12/03/2021   Benign paroxysmal positional vertigo 09/03/2021   Healthcare maintenance 01/28/2021   Hyperlipidemia 11/22/2019   Adnexal pain 09/06/2018   Paroxysmal atrial fibrillation (HCC) 08/24/2017   Chronic pain of both knees 08/16/2016   Back pain 06/23/2016   Urticaria 07/01/2015   Seasonal and perennial allergic rhinitis 03/04/2015   Asthma with COPD 06/19/2014   Blood pressure elevated without history of HTN 03/05/2014   HNP (herniated nucleus pulposus), lumbar 04/16/2013   Tenosynovitis, de Quervain 11/15/2012   Morbid obesity with BMI of 45.0-49.9, adult ([place]) 09/08/2012   Carpal tunnel syndrome 06/23/2011   Shoulder pain 09/02/2010   Sleep apnea 09/02/2010   GERD 12/24/2009   Urticaria/angioedema 12/16/2009   STRESS INCONTINENCE 01/24/2007   History of peptic ulcer disease 07/26/2006   Review of Systems  Constitutional:  Positive for malaise/fatigue.  Respiratory:  Positive for shortness of breath. Negative for wheezing.   Cardiovascular:  Negative for chest pain, palpitations, orthopnea, leg  swelling and PND.  Gastrointestinal:  Negative for blood in stool and melena.  Musculoskeletal:  Negative for back pain and joint pain.     Objective:     BP 122/76 (BP Location: Left Arm, Patient Position: Sitting, Cuff Size: Large)   Pulse (!) 58   Temp 98 F (36.7 C) (Oral)   Ht 5' (1.524 m)   Wt 256 lb 3.2 oz (116.2 kg)   LMP 04/19/2004 (Approximate)   SpO2 96% Comment: RA  BMI 50.04 kg/m  BP Readings from Last 3 Encounters:  07/05/22 122/76  05/03/22 136/78  03/15/22 (!) 144/86   Wt Readings from Last 3 Encounters:  07/05/22 256 lb 3.2 oz (116.2 kg)  05/03/22 259 lb 11.2 oz (117.8 kg)  03/15/22 264 lb 8 oz (120 kg)      Physical Exam Vitals reviewed.  Constitutional:      General: She is not in acute distress.    Appearance: Normal appearance.  Cardiovascular:     Rate and Rhythm: Normal rate and regular rhythm.     Heart sounds: Normal heart sounds. No murmur heard. Pulmonary:     Effort: Pulmonary effort is normal. No respiratory distress.     Breath sounds: Normal breath sounds. No wheezing.  Musculoskeletal:        General: No swelling.  Skin:    General: Skin is warm.     Comments: Scale and maceration between the right toes, scale over bilateral edges of the right foot  Neurological:     General: No focal deficit present.     Mental Status: She is alert and oriented to  person, place, and time.  Psychiatric:        Mood and Affect: Mood normal.        Behavior: Behavior normal.      Assessment & Plan:   Problem List Items Addressed This Visit       Cardiovascular and Mediastinum   Paroxysmal atrial fibrillation (HCC) (Chronic)    History of afib during hospitalization in 2019. Not anticoagulated d/t low CHA2DS2-VASc of 1. Normal rate and rhythm today. She does not note any palpitations or other cardiac symptoms other than increased fatigue and SOB as described elsewhere. Lower suspicion for intermittent afib as cause of symptoms, but could  consider repeat cardiac monitor if this may be contributing in the future.  Plan -No further evaluation for now      Aortic atherosclerosis (HCC)    Aortic atherosclerosis seen on imaging. Not currently on statin therapy. ASCVD risk 8.3%. Will discuss statin therapy with patient during lab review.         Respiratory   Pulmonary nodule less than 6 mm determined by computed tomography of lung (Chronic)    CT chest ordered to f/u 4 mm right lung nodule seen on imaging 11/2021. Due 11/2022.      Relevant Orders   CT CHEST NODULE FOLLOW UP LOW DOSE W/O   Sleep apnea    Sleep study in 2012 and 2019 with severe OSA. Inconsistent CPAP use over the years due to discomfort. She is not currently wearing the mask at all. It looks like Dr. Radford Pax ordered a nasal mask 05/2018, however it does not sound like Ms. Tarte has tried this device. I will confirm at lab review. Concern that untreated OSA may be contributing to daytime fatigue, low energy, and shortness of breath, possibly worsening pHTN.   Plan -Continue to encourage CPAP use, return to Dr. Radford Pax for repeat evaluation if patient is open to this      Asthma with COPD    Full PFTs in 2015 with severe diffusion defect and severe obstructive disease c/f emphysema although there was evidence of bronchodilator response. C/f COPD-asthma overlap. Remains on Breztri for maintenance with albuterol rescue prn. Also follows with allergy-immunology and receives Xolair injections for CIU and asthma/allergies. She has had recent worsening dyspnea and fatigue with exertion, not significantly improved with rescue inhaler. Likely multifactorial, not sure there are significant changes from this standpoint. Deconditioning, obesity, and untreated OSA also likely contributing.  Plan -Continue Breztri bid, albuterol prn. Albuterol nebulizer refilled. -F/u with pulmonology, allergy-immunology      Relevant Medications   albuterol (PROVENTIL) (2.5 MG/3ML)  0.083% nebulizer solution     Musculoskeletal and Integument   Tinea pedis of right foot    Itching and scaling of the right foot. ON examination, there is scale over the bilateral edges of the right foot as well as scale and maceration between the toes, c/f tinea pedis. Clotrimazole cream sent to pharmacy for twice daily use.        Other   Morbid obesity with BMI of 45.0-49.9, adult (HCC) (Chronic)    Weight decreased 8 pounds since last visit in 02/2022. Ms. Archibeque has been trying to make healthy changes to her nutrition, exercise limited by dyspnea/fatigue. She did not hear from PREP referral placed at last visit, will resend today. Weight and deconditioning are likely contributing to overall functional limitations, DOE, and fatigue. Continue to monitor.       Relevant Orders   Amb Referral To Provider Referral Exercise  Program (P.R.E.P)   Chronic pain of both knees    Patient attended PT and noted significant improvement in bilateral knee, shoulder, and back pain. Encouraged her to complete home exercises and continue physical activity to maintain benefit.      Low iron    Iron studies consistent with deficiency 11/2021, not on replacement. Given ongoing fatigue and DOE, will recheck levels today. No s/sx of blood loss at this time.  Plan -CBC, iron studies -Discussed iron-rich foods      Encounter for screening mammogram for malignant neoplasm of breast    Completing annual mammography, due 12/2022. Order placed today.      Relevant Orders   MM 3D SCREENING MAMMOGRAM BILATERAL BREAST   Encounter for screening colonoscopy    Due for screening CSY, did not receive a call from last referral and has been closed. New referral sent today.      Relevant Orders   Ambulatory referral to Gastroenterology   Other fatigue - Primary    Ms. Lerum reports worsening generalized fatigue and DOE. She is unable to walk short distances or complete tasks such as showering without  significant SOB and needing to rest. On chart review, this has been a longstanding issue for years, attributed to deconditioning,  asthma-COPD overlap, and untreated OSA with probable pHTN. Symptoms are worsening over the last several months. TTE in 2019 with normal EF and normal diastolic function. No evidence of volume overload on examination today and she denies any swelling, orthopnea, or PND symptoms. CTA in 2020 w/ calcium score of 19.5, 76th percentile. Minimal atherosclerosis of distal LM, no additional plaque noted. No ischemia on stress. Patient denies chest pain. History of pAF during hospitalization 2019, not on AC. Sinus rhythm today, no palpitations. Lower suspicion for intermittent AF as etiology. History of iron deficiency and UGIB. No current evidence of bleeding, but will recheck levels today. Iron levels low 11/2021, not on supplementation.  Overall, suspect multifactorial cause in the setting of obesity, deconditioning, asthma-COPD overlap, and untreated OSA w/ resultant pulmonary hypertension. Evidence of mild pulmonary artery dilation suggestive of pHTN on chest imaging in 2023. We are working on discussing options for OSA treatment although patient has had difficulty with multiple options in the past. Continue therapy for asthma-COPD, ?may benefit from pulmonary rehab. Lower suspicion for cardiac ischemia or pAF as a cause right now. Will rule out anemia, iron deficiency.  Plan -CBC, iron studies, vitamin D, CMP -Discuss options for OSA treatment -Continue inhalers and Xolair for COPD-asthma, ?pulmonary rehab -Obesity management        Relevant Orders   CBC no Diff (Completed)   Iron, TIBC and Ferritin Panel (Completed)   CMP14 + Anion Gap (Completed)   Vitamin D (25 hydroxy) (Completed)    Return in about 3 months (around 10/05/2022) for fu.    Charise Killian, MD

## 2022-07-05 NOTE — Telephone Encounter (Signed)
Incoming fax from pharmacy Message to prescriber :Resend cream ,ointment is not covered under patients insurance.

## 2022-07-06 ENCOUNTER — Encounter: Payer: Self-pay | Admitting: Internal Medicine

## 2022-07-06 DIAGNOSIS — R5383 Other fatigue: Secondary | ICD-10-CM | POA: Insufficient documentation

## 2022-07-06 DIAGNOSIS — Z1231 Encounter for screening mammogram for malignant neoplasm of breast: Secondary | ICD-10-CM

## 2022-07-06 DIAGNOSIS — D126 Benign neoplasm of colon, unspecified: Secondary | ICD-10-CM | POA: Insufficient documentation

## 2022-07-06 DIAGNOSIS — I700 Atherosclerosis of aorta: Secondary | ICD-10-CM | POA: Insufficient documentation

## 2022-07-06 DIAGNOSIS — B353 Tinea pedis: Secondary | ICD-10-CM | POA: Insufficient documentation

## 2022-07-06 DIAGNOSIS — Z1211 Encounter for screening for malignant neoplasm of colon: Secondary | ICD-10-CM | POA: Insufficient documentation

## 2022-07-06 HISTORY — DX: Encounter for screening mammogram for malignant neoplasm of breast: Z12.31

## 2022-07-06 HISTORY — DX: Other fatigue: R53.83

## 2022-07-06 MED ORDER — CLOTRIMAZOLE 1 % EX CREA: TOPICAL_CREAM | CUTANEOUS | 0 refills | Status: DC

## 2022-07-06 NOTE — Addendum Note (Signed)
Addended by: Charise Killian on: 07/06/2022 08:41 AM   Modules accepted: Orders

## 2022-07-06 NOTE — Assessment & Plan Note (Signed)
Full PFTs in 2015 with severe diffusion defect and severe obstructive disease c/f emphysema although there was evidence of bronchodilator response. C/f COPD-asthma overlap. Remains on Breztri for maintenance with albuterol rescue prn. Also follows with allergy-immunology and receives Xolair injections for CIU and asthma/allergies. She has had recent worsening dyspnea and fatigue with exertion, not significantly improved with rescue inhaler. Likely multifactorial, not sure there are significant changes from this standpoint. Deconditioning, obesity, and untreated OSA also likely contributing.  Plan -Continue Breztri bid, albuterol prn. Albuterol nebulizer refilled. -F/u with pulmonology, allergy-immunology

## 2022-07-06 NOTE — Assessment & Plan Note (Addendum)
Aortic atherosclerosis seen on imaging. Not currently on statin therapy. ASCVD risk 8.3%. Will discuss statin therapy with patient during lab review.

## 2022-07-06 NOTE — Assessment & Plan Note (Signed)
CT chest ordered to f/u 4 mm right lung nodule seen on imaging 11/2021. Due 11/2022.

## 2022-07-06 NOTE — Assessment & Plan Note (Signed)
Sleep study in 2012 and 2019 with severe OSA. Inconsistent CPAP use over the years due to discomfort. She is not currently wearing the mask at all. It looks like Dr. Radford Pax ordered a nasal mask 05/2018, however it does not sound like Ms. Parisian has tried this device. I will confirm at lab review. Concern that untreated OSA may be contributing to daytime fatigue, low energy, and shortness of breath, possibly worsening pHTN.   Plan -Continue to encourage CPAP use, return to Dr. Radford Pax for repeat evaluation if patient is open to this

## 2022-07-06 NOTE — Assessment & Plan Note (Signed)
Iron studies consistent with deficiency 11/2021, not on replacement. Given ongoing fatigue and DOE, will recheck levels today. No s/sx of blood loss at this time.  Plan -CBC, iron studies -Discussed iron-rich foods

## 2022-07-06 NOTE — Assessment & Plan Note (Addendum)
Maria Nelson reports worsening generalized fatigue and DOE. She is unable to walk short distances or complete tasks such as showering without significant SOB and needing to rest. On chart review, this has been a longstanding issue for years, attributed to deconditioning,  asthma-COPD overlap, and untreated OSA with probable pHTN. Symptoms are worsening over the last several months. TTE in 2019 with normal EF and normal diastolic function. No evidence of volume overload on examination today and she denies any swelling, orthopnea, or PND symptoms. CTA in 2020 w/ calcium score of 19.5, 76th percentile. Minimal atherosclerosis of distal LM, no additional plaque noted. No ischemia on stress. Patient denies chest pain. History of pAF during hospitalization 2019, not on AC. Sinus rhythm today, no palpitations. Lower suspicion for intermittent AF as etiology. History of iron deficiency and UGIB. No current evidence of bleeding, but will recheck levels today. Iron levels low 11/2021, not on supplementation.  Overall, suspect multifactorial cause in the setting of obesity, deconditioning, asthma-COPD overlap, and untreated OSA w/ resultant pulmonary hypertension. Evidence of mild pulmonary artery dilation suggestive of pHTN on chest imaging in 2023. We are working on discussing options for OSA treatment although patient has had difficulty with multiple options in the past. Continue therapy for asthma-COPD, ?may benefit from pulmonary rehab. Lower suspicion for cardiac ischemia or pAF as a cause right now. Will rule out anemia, iron deficiency.  Plan -CBC, iron studies, vitamin D, CMP -Discuss options for OSA treatment -Continue inhalers and Xolair for COPD-asthma, ?pulmonary rehab -Obesity management

## 2022-07-06 NOTE — Assessment & Plan Note (Addendum)
Weight decreased 8 pounds since last visit in 02/2022. Maria Nelson has been trying to make healthy changes to her nutrition, exercise limited by dyspnea/fatigue. She did not hear from PREP referral placed at last visit, will resend today. Weight and deconditioning are likely contributing to overall functional limitations, DOE, and fatigue. Continue to monitor.

## 2022-07-06 NOTE — Assessment & Plan Note (Signed)
Itching and scaling of the right foot. ON examination, there is scale over the bilateral edges of the right foot as well as scale and maceration between the toes, c/f tinea pedis. Clotrimazole cream sent to pharmacy for twice daily use.

## 2022-07-06 NOTE — Assessment & Plan Note (Signed)
Patient attended PT and noted significant improvement in bilateral knee, shoulder, and back pain. Encouraged her to complete home exercises and continue physical activity to maintain benefit.

## 2022-07-06 NOTE — Assessment & Plan Note (Signed)
History of afib during hospitalization in 2019. Not anticoagulated d/t low CHA2DS2-VASc of 1. Normal rate and rhythm today. She does not note any palpitations or other cardiac symptoms other than increased fatigue and SOB as described elsewhere. Lower suspicion for intermittent afib as cause of symptoms, but could consider repeat cardiac monitor if this may be contributing in the future.  Plan -No further evaluation for now

## 2022-07-06 NOTE — Assessment & Plan Note (Signed)
Completing annual mammography, due 12/2022. Order placed today.

## 2022-07-06 NOTE — Assessment & Plan Note (Signed)
Due for screening CSY, did not receive a call from last referral and has been closed. New referral sent today.

## 2022-07-07 LAB — CMP14 + ANION GAP
ALT: 11 IU/L (ref 0–32)
AST: 15 IU/L (ref 0–40)
Albumin/Globulin Ratio: 1.3 (ref 1.2–2.2)
Albumin: 3.9 g/dL (ref 3.9–4.9)
Alkaline Phosphatase: 125 IU/L — ABNORMAL HIGH (ref 44–121)
Anion Gap: 15 mmol/L (ref 10.0–18.0)
BUN/Creatinine Ratio: 11 — ABNORMAL LOW (ref 12–28)
BUN: 11 mg/dL (ref 8–27)
Bilirubin Total: <0.2 mg/dL (ref 0.0–1.2)
CO2: 22 mmol/L (ref 20–29)
Calcium: 9.9 mg/dL (ref 8.7–10.3)
Chloride: 104 mmol/L (ref 96–106)
Creatinine, Ser: 0.96 mg/dL (ref 0.57–1.00)
Globulin, Total: 3.1 g/dL (ref 1.5–4.5)
Glucose: 77 mg/dL (ref 70–99)
Potassium: 4.3 mmol/L (ref 3.5–5.2)
Sodium: 141 mmol/L (ref 134–144)
Total Protein: 7 g/dL (ref 6.0–8.5)
eGFR: 65 mL/min/{1.73_m2} (ref >59)

## 2022-07-07 LAB — CBC
Hematocrit: 45.5 % (ref 34.0–46.6)
Hemoglobin: 14.3 g/dL (ref 11.1–15.9)
MCH: 24.8 pg — ABNORMAL LOW (ref 26.6–33.0)
MCHC: 31.4 g/dL — ABNORMAL LOW (ref 31.5–35.7)
MCV: 79 fL (ref 79–97)
Platelets: 266 10*3/uL (ref 150–450)
RBC: 5.76 x10E6/uL — ABNORMAL HIGH (ref 3.77–5.28)
RDW: 14.5 % (ref 11.7–15.4)
WBC: 9.6 10*3/uL (ref 3.4–10.8)

## 2022-07-07 LAB — IRON,TIBC AND FERRITIN PANEL
Ferritin: 113 ng/mL (ref 15–150)
Iron Saturation: 17 % (ref 15–55)
Iron: 55 ug/dL (ref 27–139)
Total Iron Binding Capacity: 325 ug/dL (ref 250–450)
UIBC: 270 ug/dL (ref 118–369)

## 2022-07-07 LAB — VITAMIN D 25 HYDROXY (VIT D DEFICIENCY, FRACTURES): Vit D, 25-Hydroxy: 17.4 ng/mL — ABNORMAL LOW (ref 30.0–100.0)

## 2022-07-08 ENCOUNTER — Encounter: Payer: Self-pay | Admitting: Internal Medicine

## 2022-07-08 MED ORDER — VITAMIN D (CHOLECALCIFEROL) 25 MCG (1000 UT) PO CAPS: 1.0000 | ORAL_CAPSULE | Freq: Every day | ORAL | 3 refills | Status: DC

## 2022-07-08 MED ORDER — FERROUS SULFATE 325 (65 FE) MG PO TABS: 325.0000 mg | ORAL_TABLET | ORAL | 3 refills | Status: DC

## 2022-07-08 NOTE — Addendum Note (Signed)
Addended by: Charise Killian on: 07/08/2022 10:01 AM   Modules accepted: Orders

## 2022-07-08 NOTE — Progress Notes (Signed)
No anemia. TSAT suggests iron deficiency with likely concomitant inflammation. Discussed starting oral iron qod.

## 2022-07-08 NOTE — Progress Notes (Signed)
Electrolytes and kidney function stable. Mild ALP elevation, will monitor.

## 2022-07-08 NOTE — Progress Notes (Signed)
Low vitamin D. Start 1000 IU daily.

## 2022-07-14 ENCOUNTER — Telehealth: Payer: Self-pay | Admitting: Internal Medicine

## 2022-07-14 NOTE — Telephone Encounter (Addendum)
Return call to [facility] NP with [facility] Call. She stated they do a house call once a year. She asked what's pt's last A1C - which is 5.3. Stated pt picked up iron pills from the pharmacy but did not get vit D pills - informed pt can purchase them OTC. Pt asked about colonoscopy - she was told she had to loose weight or it has to be done in the hospital when she talked to [facility] office over a year ago; I informed pt to call their office back.

## 2022-07-14 NOTE — Telephone Encounter (Signed)
[facility] Nurse Catalina Lunger [phone] requesting a call back stating she is in the home of the patient and needing to speak with someone about her medications.

## 2022-07-19 ENCOUNTER — Ambulatory Visit (INDEPENDENT_AMBULATORY_CARE_PROVIDER_SITE_OTHER): Payer: 59 | Admitting: *Deleted

## 2022-07-19 ENCOUNTER — Ambulatory Visit: Payer: 59

## 2022-07-19 DIAGNOSIS — L501 Idiopathic urticaria: Secondary | ICD-10-CM

## 2022-07-24 NOTE — Patient Instructions (Incomplete)
Asthma/COPD Continue Breztri 2 puffs twice a day with spacer to help prevent cough and wheeze. Reviewed proper technique Continue albuterol 2 puffs every 4 hours as needed for cough or wheeze OR albuterol via nebulizer 1 unit dose every 4-6 hours as needed for cough, wheeze, tightness in chest, or shortness of breath. You may use albuterol 2 puffs 5-15 minutes before activity Continue Xolair as below  Keep follow up with pulmonary for management of COPD Get a follow up CT scan in 11/2022 as suggested to monitor right lower lobe nodule Discussed that shortness of breath could be multifactorial such as asthma, COPD, cardiac and deconditioning. She does have a history of afib. Normal rhythm today in office. Consider referral to cardiologist.  Chronic urticaria Start Allegra 60 mg once a day (dosing due to kidney disease) to try to control hives and swelling Continue Xolair injections every 4 weeks and have access to epinephrine auto injector device If your symptoms re-occur, begin a journal of events that occurred for up to 6 hours before your symptoms began including foods and beverages consumed, soaps or perfumes you had contact with, and medications.   Allergic rhinitis Continue allergen avoidance measures directed toward dust mites, cat, dog, grass, mold, tree pollen, weed pollen, and ragweed pollen as listed below Start azelastine 2 sprays twice a day as needed for a runny nose. See if this helps with the sensation of something being in your throat. She has only had this sensation for a week. If symptoms persist consider ENT evaluation Start Allegra (fexofenadine) 60 mg once a day as needed for a runny nose as listed above Consider saline nasal rinses as needed for nasal symptoms. Use this before any medicated nasal sprays for best result  Reflux Continue Pantoprazole 40 mg once a day as previously prescribed.  Continue dietary and lifestyle modifications as listed below  Call the clinic if  this treatment plan is not working well for you   Schedule a follow up appointment in 4-6 weeks or sooner if needed  Reducing Pollen Exposure The American Academy of Allergy, Asthma and Immunology suggests the following steps to reduce your exposure to pollen during allergy seasons. Do not hang sheets or clothing out to dry; pollen may collect on these items. Do not mow lawns or spend time around freshly cut grass; mowing stirs up pollen. Keep windows closed at night.  Keep car windows closed while driving. Minimize morning activities outdoors, a time when pollen counts are usually at their highest. Stay indoors as much as possible when pollen counts or humidity is high and on windy days when pollen tends to remain in the air longer. Use air conditioning when possible.  Many air conditioners have filters that trap the pollen spores. Use a HEPA room air filter to remove pollen form the indoor air you breathe.  Control of Dog or Cat Allergen Avoidance is the best way to manage a dog or cat allergy. If you have a dog or cat and are allergic to dog or cats, consider removing the dog or cat from the home. If you have a dog or cat but don't want to find it a new home, or if your family wants a pet even though someone in the household is allergic, here are some strategies that may help keep symptoms at bay:  Keep the pet out of your bedroom and restrict it to only a few rooms. Be advised that keeping the dog or cat in only one room will not  limit the allergens to that room. Don't pet, hug or kiss the dog or cat; if you do, wash your hands with soap and water. High-efficiency particulate air (HEPA) cleaners run continuously in a bedroom or living room can reduce allergen levels over time. Regular use of a high-efficiency vacuum cleaner or a central vacuum can reduce allergen levels. Giving your dog or cat a bath at least once a week can reduce airborne allergen.  Control of Mold Allergen Mold and  fungi can grow on a variety of surfaces provided certain temperature and moisture conditions exist.  Outdoor molds grow on plants, decaying vegetation and soil.  The major outdoor mold, Alternaria and Cladosporium, are found in very high numbers during hot and dry conditions.  Generally, a late Summer - Fall peak is seen for common outdoor fungal spores.  Rain will temporarily lower outdoor mold spore count, but counts rise rapidly when the rainy period ends.  The most important indoor molds are Aspergillus and Penicillium.  Dark, humid and poorly ventilated basements are ideal sites for mold growth.  The next most common sites of mold growth are the bathroom and the kitchen.  Outdoor [employer] Use air conditioning and keep windows closed Avoid exposure to decaying vegetation. Avoid leaf raking. Avoid grain handling. Consider wearing a face mask if working in moldy areas.  Indoor Mold Control Maintain humidity below 50%. Clean washable surfaces with 5% bleach solution. Remove sources e.g. Contaminated carpets.   Control of Dust Mite Allergen Dust mites play a major role in allergic asthma and rhinitis. They occur in environments with high humidity wherever human skin is found. Dust mites absorb humidity from the atmosphere (ie, they do not drink) and feed on organic matter (including shed human and animal skin). Dust mites are a microscopic type of insect that you cannot see with the naked eye. High levels of dust mites have been detected from mattresses, pillows, carpets, upholstered furniture, bed covers, clothes, soft toys and any woven material. The principal allergen of the dust mite is found in its feces. A gram of dust may contain 1,000 mites and 250,000 fecal particles. Mite antigen is easily measured in the air during house cleaning activities. Dust mites do not bite and do not cause harm to humans, other than by triggering allergies/asthma.  Ways to decrease your exposure to dust mites  in your home:  1. Encase mattresses, box springs and pillows with a mite-impermeable barrier or cover  2. Wash sheets, blankets and drapes weekly in hot water (130 F) with detergent and dry them in a dryer on the hot setting.  3. Have the room cleaned frequently with a vacuum cleaner and a damp dust-mop. For carpeting or rugs, vacuuming with a vacuum cleaner equipped with a high-efficiency particulate air (HEPA) filter. The dust mite allergic individual should not be in a room which is being cleaned and should wait 1 hour after cleaning before going into the room.  4. Do not sleep on upholstered furniture (eg, couches).  5. If possible removing carpeting, upholstered furniture and drapery from the home is ideal. Horizontal blinds should be eliminated in the rooms where the person spends the most time (bedroom, study, television room). Washable vinyl, roller-type shades are optimal.  6. Remove all non-washable stuffed toys from the bedroom. Wash stuffed toys weekly like sheets and blankets above.  7. Reduce indoor humidity to less than 50%. Inexpensive humidity monitors can be purchased at most hardware stores. Do not use a humidifier as  can make the problem worse and are not recommended.

## 2022-07-26 ENCOUNTER — Ambulatory Visit (INDEPENDENT_AMBULATORY_CARE_PROVIDER_SITE_OTHER): Payer: 59 | Admitting: Family

## 2022-07-26 ENCOUNTER — Telehealth: Payer: Self-pay | Admitting: Family

## 2022-07-26 ENCOUNTER — Other Ambulatory Visit: Payer: Self-pay

## 2022-07-26 ENCOUNTER — Encounter: Payer: Self-pay | Admitting: Family

## 2022-07-26 VITALS — BP 122/84 | HR 60 | Temp 97.6°F | Resp 20 | Ht 60.0 in | Wt 256.7 lb

## 2022-07-26 DIAGNOSIS — K219 Gastro-esophageal reflux disease without esophagitis: Secondary | ICD-10-CM

## 2022-07-26 DIAGNOSIS — T783XXD Angioneurotic edema, subsequent encounter: Secondary | ICD-10-CM

## 2022-07-26 DIAGNOSIS — J302 Other seasonal allergic rhinitis: Secondary | ICD-10-CM

## 2022-07-26 DIAGNOSIS — J3089 Other allergic rhinitis: Secondary | ICD-10-CM | POA: Diagnosis not present

## 2022-07-26 DIAGNOSIS — J4489 Other specified chronic obstructive pulmonary disease: Secondary | ICD-10-CM

## 2022-07-26 DIAGNOSIS — L501 Idiopathic urticaria: Secondary | ICD-10-CM | POA: Diagnosis not present

## 2022-07-26 MED ORDER — FEXOFENADINE HCL 60 MG PO TABS: 60.0000 mg | ORAL_TABLET | Freq: Every day | ORAL | 2 refills | Status: DC

## 2022-07-26 NOTE — Telephone Encounter (Signed)
Left message for Maria Nelson to call back. Please let her know that I am changing her antihistamine to Allegra (fexofenadine) 60 mg once a day rather than Xyzal 5 mg once a day due to her chronic kidney disease.  [name], FNP [facility]

## 2022-07-26 NOTE — Telephone Encounter (Signed)
I called patient and informed of change. I informed that Maria Nelson has been sent into the [facility] in [place] on [address]

## 2022-07-26 NOTE — Progress Notes (Addendum)
[address] [place] [address] Dept: [phone]  FOLLOW UP NOTE  Patient ID: Maria Nelson, female    DOB: Jan 10, 1956  Age: 67 y.o. MRN: [id number] Date of Office Visit: 07/26/2022  Assessment  Chief Complaint: Asthma, Idiopathic Urticaria, and Follow-up  HPI Maria Nelson is a 67 year old female who presents today for follow-up of asthma COPD, seasonal and perennial allergic rhinitis, urticaria, angioedema, and gastroesophageal reflux disease.  She was last seen on May 03, 2022 by Thermon Leyland, FNP.  She denies any new diagnosis or surgery since her last office visit.  She has a history of A-fib and obstructive sleep apnea.  She has not worn sleep machine in years.  Asthma/COPD: She continues to take Breztri 2 puffs twice a day with spacer, albuterol as needed, and Xolair 300 mg every 28 days.  She was previously on Fasenra injections, but was changed to Xolair due to chronic urticaria/angioedema.  She has noticed that ever since changing from [place] to [place] that she will have a white film on her spacer.  She does clean her spacer.  She reports that her breathing has not changed.  She is still short of breath.  She reports that her shortness of breath is no worse since changing from [place] to Xolair.  She reports shortness of breath when she bends over to tie her shoes or exerts herself.  She also feels tightness in her chest when she exerts herself.  She also has a cough.  For the past week she feels like she has had something in her throat off-and-on that will not come out.  She is not sure if she is wheezing.  She notices that if she is not wearing a bra but lifts up her chest and takes a deep breath that she can breathe better.  Also mentions that if she lifts something heavy that this takes something away from her.  Since her last office visit she has not required any systemic steroids or made any trips to the emergency room or urgent care due to  breathing problems.  She uses her albuterol inhaler approximately 2 times a week she notes that she is not supposed to use her albuterol frequently, but can have symptoms every other day to every day.  She reports it helps if she uses her albuterol before she walks.  She mentions that she has an upcoming appointment with her pulmonologist next month.  Chronic urticaria/angioedema: She reports no hives or swelling since starting Xolair injections.  She does have an EpiPen and knows how to use it.  Last night her top lip did tingle, but never swelled.  She took Benadryl and did not have any other symptoms.  She is currently not taking Xyzal daily due to not having any.  Allergic rhinitis: She denies rhinorrhea, nasal congestion, and postnasal drip.  She has not had any sinus infections since we last saw her.  She is currently not using azelastine nasal spray and is not taking any antihistamine.  Gastroesophageal reflux disease: She continues to take pantoprazole 40 mg once a day.  She denies heartburn or reflux symptoms.     Drug Allergies:  Allergies  Allergen Reactions   Bee Venom Anaphylaxis   Aspirin Nausea Only   Other Rash    EKG pads cause burning sensation as soon as applied    Review of Systems: Review of Systems  Constitutional:  Negative for chills and fever.  HENT:  Denies rhinorrhea, nasal congestion, and postnasal drip  Eyes:        Denies itchy watery eyes  Respiratory:  Positive for cough and shortness of breath.        Reports a cough where she feels like something is in her throat that will not come off.  This has been off-and-on for a week.  She also reports tightness in her chest when she exerts herself.  She also reports shortness of breath when she bends over to tie her shoes or with exertion.  She is not sure if she is wheezing.  Cardiovascular:  Negative for chest pain, palpitations and leg swelling.  Gastrointestinal:        Denies heartburn or reflux  symptoms on pantoprazole 40 mg once a day  Skin:  Negative for itching and rash.  Neurological:  Negative for headaches.  Endo/Heme/Allergies:  Positive for environmental allergies.     Physical Exam: BP 122/84   Pulse 60   Temp 97.6 F (36.4 C) (Temporal)   Resp 20   Ht 5' (1.524 m)   Wt 256 lb 11.2 oz (116.4 kg)   LMP 04/19/2004 (Approximate)   SpO2 94%   BMI 50.13 kg/m    Physical Exam Constitutional:      Appearance: Normal appearance. She is obese.  HENT:     Head: Normocephalic and atraumatic.     Comments: Pharynx normal, eyes normal, ears normal, nose: Bilateral lower turbinates mildly edematous with clear drainage noted    Right Ear: Tympanic membrane, ear canal and external ear normal.     Left Ear: Tympanic membrane, ear canal and external ear normal.     Mouth/Throat:     Mouth: Mucous membranes are moist.     Pharynx: Oropharynx is clear.  Eyes:     Conjunctiva/sclera: Conjunctivae normal.  Cardiovascular:     Rate and Rhythm: Normal rate and regular rhythm.     Heart sounds: Normal heart sounds.  Pulmonary:     Effort: Pulmonary effort is normal.     Breath sounds: Normal breath sounds.     Comments: Lungs clear to auscultation Musculoskeletal:     Cervical back: Neck supple.  Skin:    General: Skin is warm.  Neurological:     Mental Status: She is alert and oriented to person, place, and time.  Psychiatric:        Mood and Affect: Mood normal.        Behavior: Behavior normal.        Thought Content: Thought content normal.        Judgment: Judgment normal.     Diagnostics: FVC 2.13 L (97%), FEV1 1.27 L (73%).  FEV1/FVC ratio 0.60.  Spirometry indicates moderate airway obstruction.  FEV1 is increased from previous spirometry.  Assessment and Plan: 1. Asthma with COPD   2. Idiopathic urticaria   3. Angioedema, subsequent encounter   4. Seasonal and perennial allergic rhinitis   5. Gastroesophageal reflux disease without esophagitis      Meds ordered this encounter  Medications   fexofenadine (ALLEGRA ALLERGY) 60 MG tablet    Sig: Take 1 tablet (60 mg total) by mouth daily.    Dispense:  30 tablet    Refill:  2    Patient Instructions  Asthma/COPD Continue Breztri 2 puffs twice a day with spacer to help prevent cough and wheeze. Reviewed proper technique Continue albuterol 2 puffs every 4 hours as needed for cough or wheeze OR albuterol via nebulizer  1 unit dose every 4-6 hours as needed for cough, wheeze, tightness in chest, or shortness of breath. You may use albuterol 2 puffs 5-15 minutes before activity Continue Xolair as below  Keep follow up with pulmonary for management of COPD Get a follow up CT scan in 11/2022 as suggested to monitor right lower lobe nodule Discussed that shortness of breath could be multifactorial such as asthma, COPD, cardiac and deconditioning. She does have a history of afib. Normal rhythm today in office. Consider referral to cardiologist.  Chronic urticaria Start Allegra 60 mg once a day (dosing due to kidney disease) to try to control hives and swelling Continue Xolair injections every 4 weeks and have access to epinephrine auto injector device If your symptoms re-occur, begin a journal of events that occurred for up to 6 hours before your symptoms began including foods and beverages consumed, soaps or perfumes you had contact with, and medications.   Allergic rhinitis Continue allergen avoidance measures directed toward dust mites, cat, dog, grass, mold, tree pollen, weed pollen, and ragweed pollen as listed below Start azelastine 2 sprays twice a day as needed for a runny nose. See if this helps with the sensation of something being in your throat. She has only had this sensation for a week. If symptoms persist consider ENT evaluation Start Allegra (fexofenadine) 60 mg once a day as needed for a runny nose as listed above Consider saline nasal rinses as needed for nasal symptoms. Use  this before any medicated nasal sprays for best result  Reflux Continue Pantoprazole 40 mg once a day as previously prescribed.  Continue dietary and lifestyle modifications as listed below  Call the clinic if this treatment plan is not working well for you   Schedule a follow up appointment in 4-6 weeks or sooner if needed  Reducing Pollen Exposure The American Academy of Allergy, Asthma and Immunology suggests the following steps to reduce your exposure to pollen during allergy seasons. Do not hang sheets or clothing out to dry; pollen may collect on these items. Do not mow lawns or spend time around freshly cut grass; mowing stirs up pollen. Keep windows closed at night.  Keep car windows closed while driving. Minimize morning activities outdoors, a time when pollen counts are usually at their highest. Stay indoors as much as possible when pollen counts or humidity is high and on windy days when pollen tends to remain in the air longer. Use air conditioning when possible.  Many air conditioners have filters that trap the pollen spores. Use a HEPA room air filter to remove pollen form the indoor air you breathe.  Control of Dog or Cat Allergen Avoidance is the best way to manage a dog or cat allergy. If you have a dog or cat and are allergic to dog or cats, consider removing the dog or cat from the home. If you have a dog or cat but don't want to find it a new home, or if your family wants a pet even though someone in the household is allergic, here are some strategies that may help keep symptoms at bay:  Keep the pet out of your bedroom and restrict it to only a few rooms. Be advised that keeping the dog or cat in only one room will not limit the allergens to that room. Don't pet, hug or kiss the dog or cat; if you do, wash your hands with soap and water. High-efficiency particulate air (HEPA) cleaners run continuously in a bedroom or  living room can reduce allergen levels over  time. Regular use of a high-efficiency vacuum cleaner or a central vacuum can reduce allergen levels. Giving your dog or cat a bath at least once a week can reduce airborne allergen.  Control of Mold Allergen Mold and fungi can grow on a variety of surfaces provided certain temperature and moisture conditions exist.  Outdoor molds grow on plants, decaying vegetation and soil.  The major outdoor mold, Alternaria and Cladosporium, are found in very high numbers during hot and dry conditions.  Generally, a late Summer - Fall peak is seen for common outdoor fungal spores.  Rain will temporarily lower outdoor mold spore count, but counts rise rapidly when the rainy period ends.  The most important indoor molds are Aspergillus and Penicillium.  Dark, humid and poorly ventilated basements are ideal sites for mold growth.  The next most common sites of mold growth are the bathroom and the kitchen.  Outdoor MicrosoftMold Control Use air conditioning and keep windows closed Avoid exposure to decaying vegetation. Avoid leaf raking. Avoid grain handling. Consider wearing a face mask if working in moldy areas.  Indoor Mold Control Maintain humidity below 50%. Clean washable surfaces with 5% bleach solution. Remove sources e.g. Contaminated carpets.   Control of Dust Mite Allergen Dust mites play a major role in allergic asthma and rhinitis. They occur in environments with high humidity wherever human skin is found. Dust mites absorb humidity from the atmosphere (ie, they do not drink) and feed on organic matter (including shed human and animal skin). Dust mites are a microscopic type of insect that you cannot see with the naked eye. High levels of dust mites have been detected from mattresses, pillows, carpets, upholstered furniture, bed covers, clothes, soft toys and any woven material. The principal allergen of the dust mite is found in its feces. A gram of dust may contain 1,000 mites and 250,000 fecal particles.  Mite antigen is easily measured in the air during house cleaning activities. Dust mites do not bite and do not cause harm to humans, other than by triggering allergies/asthma.  Ways to decrease your exposure to dust mites in your home:  1. Encase mattresses, box springs and pillows with a mite-impermeable barrier or cover  2. Wash sheets, blankets and drapes weekly in hot water (130 F) with detergent and dry them in a dryer on the hot setting.  3. Have the room cleaned frequently with a vacuum cleaner and a damp dust-mop. For carpeting or rugs, vacuuming with a vacuum cleaner equipped with a high-efficiency particulate air (HEPA) filter. The dust mite allergic individual should not be in a room which is being cleaned and should wait 1 hour after cleaning before going into the room.  4. Do not sleep on upholstered furniture (eg, couches).  5. If possible removing carpeting, upholstered furniture and drapery from the home is ideal. Horizontal blinds should be eliminated in the rooms where the person spends the most time (bedroom, study, television room). Washable vinyl, roller-type shades are optimal.  6. Remove all non-washable stuffed toys from the bedroom. Wash stuffed toys weekly like sheets and blankets above.  7. Reduce indoor humidity to less than 50%. Inexpensive humidity monitors can be purchased at most hardware stores. Do not use a humidifier as can make the problem worse and are not recommended.   Return in about 4 weeks (around 08/23/2022), or if symptoms worsen or fail to improve.    Thank you for the opportunity to care  for this patient.  Please do not hesitate to contact me with questions.  [name], FNP [facility]

## 2022-08-12 ENCOUNTER — Encounter: Payer: Self-pay | Admitting: Gastroenterology

## 2022-08-16 ENCOUNTER — Ambulatory Visit (INDEPENDENT_AMBULATORY_CARE_PROVIDER_SITE_OTHER): Payer: 59

## 2022-08-16 DIAGNOSIS — L501 Idiopathic urticaria: Secondary | ICD-10-CM

## 2022-08-18 ENCOUNTER — Ambulatory Visit (HOSPITAL_COMMUNITY): Payer: 59

## 2022-08-25 ENCOUNTER — Ambulatory Visit (INDEPENDENT_AMBULATORY_CARE_PROVIDER_SITE_OTHER): Payer: 59 | Admitting: Internal Medicine

## 2022-08-25 ENCOUNTER — Encounter: Payer: Self-pay | Admitting: Internal Medicine

## 2022-08-25 VITALS — BP 128/74 | HR 79 | Temp 98.2°F | Ht 60.0 in | Wt 255.4 lb

## 2022-08-25 DIAGNOSIS — Z87891 Personal history of nicotine dependence: Secondary | ICD-10-CM

## 2022-08-25 DIAGNOSIS — J4489 Other specified chronic obstructive pulmonary disease: Secondary | ICD-10-CM | POA: Diagnosis not present

## 2022-08-25 NOTE — Progress Notes (Signed)
Maria Nelson    [id number]    Apr 27, 1955  Primary Care Physician:Lau, Delorise Shiner, MD Date of Appointment: 08/25/2022 Established Patient Visit  Chief complaint:   Chief Complaint  Patient presents with   Follow-up    SOB/ cough/ wheezing not improving      HPI: Maria Nelson is a 67 y.o. woman with 40 pack year smoking history and copd  with untreated OHS/OSA.   Interval Updates: Here for follow up after six months. Has been seeing allergy and is on xolair for idiopathic chronic urticaria.   Continues to have dyspnea on exertion. Mouth breathing. Post nasal drainage.  Went to urgent care in [place] in December for copd exacerbation. Still not motivated to use cpap.  She is trying to lose weight.    Current Regimen: Breztri 2 puffs twice a day, albuterol prn, xolair.  Asthma Triggers: seasonal allergies, URI Exacerbations in the last year:  December 2023 History of hospitalization or intubation: never Allergy Testing: yes in 2022 - multiple environmental aeroallergens GERD: yes on PPI with protonix daily.  Allergic Rhinitis: yes not currently on therapy, was prescribed allegra at last visit.  ACT:  Asthma Control Test ACT Total Score  08/25/2022  2:05 PM 15  05/03/2022  2:00 PM 24  06/19/2021 12:00 PM 16   FeNO:  IgE 1443 Absolute eosinophil count 1000 in 2016.  I have reviewed the patient's family social and past medical history and updated as appropriate.   Past Medical History:  Diagnosis Date   ANGIOEDEMA 12/16/2009   Asthma    Carpal tunnel syndrome 06/23/2011   Patient notes history of bilateral carpal tunnel syndrome.  Now has symptoms of right hand carpal tunnel.    COPD (chronic obstructive pulmonary disease) (HCC)    secondary to tobacco use // No PFTs on file   COVID-19    Dyspepsia 08/24/2017   ETOH abuse    Pt stopped in 3/08   Gastrointestinal hemorrhage    secondary to PUD on March 2008   OSA on CPAP     noncompliant with CPAP (2/2 it being depressing)   Peptic ulcer disease    +H. pylori (antigen)- Dr. Juanda Chance- treated   STRESS INCONTINENCE 01/24/2007   Tobacco abuse    quit in 2010   Urinary incontinence    Urticaria    Urticaria 07/01/2015    Past Surgical History:  Procedure Laterality Date   BREAST EXCISIONAL BIOPSY Right 1994   MANDIBLE SURGERY     due to trauma   TUBAL LIGATION      Family History  Problem Relation Age of Onset   Diabetes Mother    Coronary artery disease Mother 73       requiring 5 vessel CABG   Diabetes Sister    Breast cancer Sister 11       died 2/2 breast ca age 59-60    Social History   Occupational History   Occupation: Disability    Comment: previously worked in [employer] had to stop 2/2 occupational exposures leading to exacerbations of asthma   Tobacco Use   Smoking status: Former    Packs/day: 0.50    Years: 40.00    Additional pack years: 0.00    Total pack years: 20.00    Types: Cigarettes    Quit date: 12/04/2010    Years since quitting: 11.7   Smokeless tobacco: Never  Vaping Use   Vaping Use: Never used  Substance  and Sexual Activity   Alcohol use: Not Currently    Alcohol/week: 2.0 standard drinks of alcohol    Types: 1 Glasses of wine, 1 Cans of beer per week    Comment: 1-2 glasses per week, beer 1 per week   Drug use: No   Sexual activity: Not Currently     Physical Exam: Blood pressure 128/74, pulse 79, temperature 98.2 F (36.8 C), temperature source Oral, height 5' (1.524 m), weight 255 lb 6.4 oz (115.8 kg), last menstrual period 04/19/2004, SpO2 97 %.  Gen:      No acute distress ENT:  mallampati IV, no nasal polyps, mucus membranes moist Lungs:    No increased respiratory effort, symmetric chest wall excursion, clear to auscultation bilaterally, no wheezes or crackles CV:         Regular rate and rhythm; no murmurs, rubs, or gallops.  No pedal edema   Data Reviewed: Imaging: I have personally reviewed  the CT Chest August 2023 - mild emphysema. 4mm RML nodule.  PFTs:     Latest Ref Rng & Units 03/11/2014   12:16 PM  PFT Results  FVC-Pre L 1.50   FVC-Predicted Pre % 66   FVC-Post L 1.75   FVC-Predicted Post % 77   Pre FEV1/FVC % % 55   Post FEV1/FCV % % 51   FEV1-Pre L 0.82   FEV1-Predicted Pre % 46   FEV1-Post L 0.90   DLCO uncorrected ml/min/mmHg 7.57   DLCO UNC% % 40   DLCO corrected ml/min/mmHg 7.57   DLCO COR %Predicted % 40   DLVA Predicted % 79   TLC L 3.88   TLC % Predicted % 87   RV % Predicted % 125    I have personally reviewed the patient's PFTs and moderate airflow limitation confirmed on most recent spirometry from allergy clinic  Labs: Elevated IgE, multiple grass and tree sensitivities.  Lab Results  Component Value Date   NA 141 07/05/2022   K 4.3 07/05/2022   CO2 22 07/05/2022   GLUCOSE 77 07/05/2022   BUN 11 07/05/2022   CREATININE 0.96 07/05/2022   CALCIUM 9.9 07/05/2022   EGFR 65 07/05/2022   GFRNONAA 45 (L) 11/29/2021   Lab Results  Component Value Date   WBC 9.6 07/05/2022   HGB 14.3 07/05/2022   HCT 45.5 07/05/2022   MCV 79 07/05/2022   PLT 266 07/05/2022    Immunization status: Immunization History  Administered Date(s) Administered   Fluad Quad(high Dose 65+) 01/28/2021, 03/15/2022   Influenza Whole 01/24/2007, 06/06/2009, 12/24/2009, 12/25/2010   Influenza,inj,Quad PF,6+ Mos 02/14/2013, 03/05/2014, 02/11/2016, 05/10/2018, 12/05/2018   PFIZER(Purple Top)SARS-COV-2 Vaccination 08/21/2019   PNEUMOCOCCAL CONJUGATE-20 07/13/2021   Pneumococcal Polysaccharide-23 06/06/2009   Tdap 06/23/2011    External Records Personally Reviewed: allergy.   Assessment:  Dyspnea on exertion - multifactorial from COPD, obesity, untreated OHS/OSA Asthma COPD overlap syndrome - predominantly copd.  Chronic urticaria, Elevated IgE with allergic rhinitis, on xolair Need for lung cancer screening - Solitary pulmonary nodule, 4mm, high risk patient.    Plan/Recommendations: Continue breztri 2 puffs twice a day, gargle after use.  You are having worsening post nasal drainage and allergy symptoms. Start taking allegra - this was sent to the [facility] pharmacy by your allergist on April 8th and should be available to pick up.   Continue albuterol inhaler as needed and before activity/exertion.   Let me know if you change your mind and want to try CPAP again - I think sleeping  well will help your breathing during the day.   You are due for a low dose ct for lung cancer screening in August 2024. Dr. Sol Blazing has ordered this. Let me know if you have any issues getting this completed.    Return to Care: Return in about 6 months (around 02/25/2023).   Durel Salts, MD Pulmonary and Critical Care Medicine [facility] Office:[phone]

## 2022-08-25 NOTE — Patient Instructions (Addendum)
Please schedule follow up scheduled with myself in 6 months.  If my schedule is not open yet, we will contact you with a reminder closer to that time. Please call [phone] if you haven't heard from [place] a month before.   Continue breztri 2 puffs twice a day, gargle after use.  You are having worsening post nasal drainage and allergy symptoms. Start taking allegra - this was sent to the [facility] pharmacy by your allergist on April 8th and should be available to pick up.   Continue albuterol inhaler as needed and before activity/exertion.   Let me know if you change your mind and want to try CPAP again - I think sleeping well will help your breathing during the day.   You are due for a low dose ct for lung cancer screening in August 2024. Dr. Sol Blazing has ordered this. Let me know if you have any issues getting this completed.

## 2022-08-31 NOTE — Progress Notes (Unsigned)
FOLLOW UP Date of Service/Encounter:  09/01/22   Subjective:  Maria Nelson (DOB: 1955/05/24) is a 67 y.o. female who [facility] in re-evaluation of the following: asthma COPD, seasonal and perennial allergic rhinitis, urticaria, angioedema, and gastroesophageal reflux disease  History obtained from: chart review and patient.  For Review, LV was on 07/26/22  with Maria Settle, FNP seen for routine follow-up. See below for summary of history and diagnostics.  Therapeutic plans/changes recommended: No noted improvements or worsening of breathing since switching to Xolair.  No additional hives or swelling since starting Xolair. FEV1 73%, moderate obstruction present  Pertinent History/Diagnostics:  Asthma/COPD : Managed on Breztri 2 puffs BID + Xolair. Previously on Fasenra but switched to Xolair due to CSU Also follows with PulmonaryHx of RLL nodule being monitored. 40 pack year smoking history Untreated OHS/OSA Hx of a. Fib followed by cardiology Solitary lung nodule in a high risk patient. CT Chest 11/27/21:  IMPRESSION: 1. Mild emphysema and bronchial thickening. Subsegmental atelectasis in the lingula and right lower lobe. 2. Right middle lobe 4 mm nodule. Although likely benign, if the patient is high-risk, given the morphology and/or location of this nodule a non-contrast chest CT can be considered in 12 months Allergic Rhinitis:  Hx of GERD on pantoprazole 40 mg daily and controlled. Hx of CKD on low dose AH. - Serum environmental panel (05/02/20): + cat, dog, grass pollen, molds, tree pollen, borderline dust mites and weed pollens Chronic urticaria: Controlled on Xolair which was started 05/24/22 06/19/21-Her alpha gal level was negative.  Total IgE 758.  Normal baseline tryptase of 7.7  C4 within normal limits on 10/28/2015 as well as normal C1 esterase inhibitor, C3, and C4 on 07/30/2013   Today presents for  follow-up. She is doing well-states her breathing remains similar.  She is following now with [facility] for her COPD and lung nodules. Planning on repeat CT scan at the end of the month. She does try not to overexert herself. She tries not to lift anything heavier than needed because when she does she feels tired.  She is able to rest and then resume work. No more swelling or hives episodes since starting xolair. She does use her rescue inhaler around once per day, no steroids or antibiotics since last visit.  She does feel her reflux is controlled with pantoprazole. She is not taking allegra, doesn't feel she has needed it.  She doesn't feel she has a lot of drainage, but her throat is somewhat sore.  She is not open to nasal sprays.  She is willing to try allegra.  Allergies as of 09/01/2022       Reactions   Bee Venom Anaphylaxis   Aspirin Nausea Only   Other Rash   EKG pads cause burning sensation as soon as applied        Medication List        Accurate as of Sep 01, 2022  1:38 PM. If you have any questions, ask your nurse or doctor.          STOP taking these medications    ferrous sulfate 325 (65 FE) MG tablet Stopped by: [name], MD   fexofenadine 60 MG tablet Commonly known as: Allegra Allergy Stopped by: [name], MD       TAKE these medications    albuterol 108 (90 Base) MCG/ACT inhaler Commonly known as: Ventolin HFA Inhale 2 puffs into the lungs every  6 (six) hours as needed for wheezing or shortness of breath.   albuterol (2.5 MG/3ML) 0.083% nebulizer solution Commonly known as: PROVENTIL Use 1 vial in nebulizer up to 3 times a day when albuterol inhaler not working. If no better or getting worse, call 911 or physician line.   Breztri Aerosphere 160-9-4.8 MCG/ACT Aero Generic drug: Budeson-Glycopyrrol-Formoterol Inhale 2 puffs into the lungs 2 (two) times daily.   clotrimazole 1 % cream Commonly known as: LOTRIMIN Apply to right  foot and between right toes twice daily for two weeks.   EPINEPHrine 0.3 mg/0.3 mL Soaj injection Commonly known as: EpiPen 2-Pak Inject 0.3 mg into the muscle as needed (for allergic reaction).   pantoprazole 40 MG tablet Commonly known as: PROTONIX Take 1 tablet by mouth once daily   Vitamin D (Cholecalciferol) 25 MCG (1000 UT) Caps Take 1 capsule by mouth daily.       Past Medical History:  Diagnosis Date   ANGIOEDEMA 12/16/2009   Asthma    Carpal tunnel syndrome 06/23/2011   Patient notes history of bilateral carpal tunnel syndrome.  Now has symptoms of right hand carpal tunnel.    COPD (chronic obstructive pulmonary disease) (HCC)    secondary to tobacco use // No PFTs on file   COVID-19    Dyspepsia 08/24/2017   ETOH abuse    Pt stopped in 3/08   Gastrointestinal hemorrhage    secondary to PUD on March 2008   OSA on CPAP    noncompliant with CPAP (2/2 it being depressing)   Peptic ulcer disease    +H. pylori (antigen)- Dr. Juanda Chance- treated   STRESS INCONTINENCE 01/24/2007   Tobacco abuse    quit in 2010   Urinary incontinence    Urticaria    Urticaria 07/01/2015   Past Surgical History:  Procedure Laterality Date   BREAST EXCISIONAL BIOPSY Right 1994   MANDIBLE SURGERY     due to trauma   TUBAL LIGATION     Otherwise, there have been no changes to her past medical history, surgical history, family history, or social history.  ROS: All others negative except as noted per HPI.   Objective:  BP 128/84   Pulse 63   Temp 98 F (36.7 C) (Temporal)   Resp 20   LMP 04/19/2004 (Approximate)   SpO2 94%  There is no height or weight on file to calculate BMI. Physical Exam: General Appearance:  Alert, cooperative, no distress, appears stated age  Head:  Normocephalic, without obvious abnormality, atraumatic  Eyes:  Conjunctiva clear, EOM's intact  Nose: Nares normal, hypertrophic turbinates, normal mucosa, and no visible anterior polyps  Throat: Lips, tongue  normal; teeth and gums normal, normal posterior oropharynx  Neck: Supple, symmetrical  Lungs:   clear to auscultation bilaterally, Respirations unlabored, no coughing  Heart:  regular rate and rhythm and no murmur, Appears well perfused  Extremities: No edema  Skin: Skin color, texture, turgor normal and no rashes or lesions on visualized portions of skin  Neurologic: No gross deficits  ACT 19/25. Assessment/Plan   Asthma/COPD-currently stable No changes today, however, may consider Dupixent in future if symptoms worsen which could possibly help with hives and COPD although current indication would be for asthma only Continue Breztri 2 puffs twice a day with spacer to help prevent cough and wheeze. Reviewed proper technique Continue albuterol 2 puffs every 4 hours as needed for cough or wheeze OR albuterol via nebulizer 1 unit dose every 4-6 hours as needed for  cough, wheeze, tightness in chest, or shortness of breath. You may use albuterol 2 puffs 5-15 minutes before activity Continue Xolair as below  Keep follow up with pulmonary for management of COPD Get a follow up CT scan as suggested to monitor right lower lobe nodule  Chronic urticaria-controlled Start Allegra 60 mg once a day (dosing due to kidney disease) to try to control hives and swelling Continue Xolair injections every 4 weeks and have access to epinephrine auto injector device If your symptoms re-occur, begin a journal of events that occurred for up to 6 hours before your symptoms began including foods and beverages consumed, soaps or perfumes you had contact with, and medications.   Allergic rhinitis-partially controlled Continue allergen avoidance measures directed toward dust mites, cat, dog, grass, mold, tree pollen, weed pollen, and ragweed pollen as listed below Start Allegra (fexofenadine) 60 mg once a day as needed for a runny nose as listed above Consider saline nasal rinses as needed for nasal symptoms. Use this  before any medicated nasal sprays for best result  Reflux-controlled Continue Pantoprazole 40 mg once a day as previously prescribed.  Continue dietary and lifestyle modifications as listed below  Call the clinic if this treatment plan is not working well for you   Schedule a follow up appointment in 4 months or sooner if needed  Tonny Bollman, MD  [facility]

## 2022-09-01 ENCOUNTER — Ambulatory Visit (INDEPENDENT_AMBULATORY_CARE_PROVIDER_SITE_OTHER): Payer: 59 | Admitting: Internal Medicine

## 2022-09-01 ENCOUNTER — Encounter: Payer: Self-pay | Admitting: Internal Medicine

## 2022-09-01 ENCOUNTER — Telehealth: Payer: Self-pay

## 2022-09-01 VITALS — BP 128/84 | HR 63 | Temp 98.0°F | Resp 20

## 2022-09-01 DIAGNOSIS — L508 Other urticaria: Secondary | ICD-10-CM | POA: Diagnosis not present

## 2022-09-01 DIAGNOSIS — J4489 Other specified chronic obstructive pulmonary disease: Secondary | ICD-10-CM

## 2022-09-01 DIAGNOSIS — J302 Other seasonal allergic rhinitis: Secondary | ICD-10-CM

## 2022-09-01 DIAGNOSIS — K219 Gastro-esophageal reflux disease without esophagitis: Secondary | ICD-10-CM

## 2022-09-01 DIAGNOSIS — J3089 Other allergic rhinitis: Secondary | ICD-10-CM | POA: Diagnosis not present

## 2022-09-01 DIAGNOSIS — R911 Solitary pulmonary nodule: Secondary | ICD-10-CM

## 2022-09-01 NOTE — Patient Instructions (Addendum)
Asthma/COPD-current stable No changes today, however, may consider Dupixent in future if symptoms worsen which could possibly help with hives and COPD although current indication would be for asthma only Continue Breztri 2 puffs twice a day with spacer to help prevent cough and wheeze. Reviewed proper technique Continue albuterol 2 puffs every 4 hours as needed for cough or wheeze OR albuterol via nebulizer 1 unit dose every 4-6 hours as needed for cough, wheeze, tightness in chest, or shortness of breath. You may use albuterol 2 puffs 5-15 minutes before activity Continue Xolair as below  Keep follow up with pulmonary for management of COPD Get a follow up CT scan as suggested to monitor right lower lobe nodule  Chronic urticaria-controlled Start Allegra 60 mg once a day (dosing due to kidney disease) to try to control hives and swelling Continue Xolair injections every 4 weeks and have access to epinephrine auto injector device If your symptoms re-occur, begin a journal of events that occurred for up to 6 hours before your symptoms began including foods and beverages consumed, soaps or perfumes you had contact with, and medications.   Allergic rhinitis-partially controlled Continue allergen avoidance measures directed toward dust mites, cat, dog, grass, mold, tree pollen, weed pollen, and ragweed pollen as listed below Start Allegra (fexofenadine) 60 mg once a day as needed for a runny nose as listed above Consider saline nasal rinses as needed for nasal symptoms. Use this before any medicated nasal sprays for best result  Reflux-controlled Continue Pantoprazole 40 mg once a day as previously prescribed.  Continue dietary and lifestyle modifications as listed below  Call the clinic if this treatment plan is not working well for you   Schedule a follow up appointment in 4 months or sooner if needed  Reducing Pollen Exposure The American Academy of All[employer], Asthma and [employer] suggests  the following steps to reduce your exposure to pollen during allergy seasons. Do not hang sheets or clothing out to dry; pollen may collect on these items. Do not mow lawns or spend time around freshly cut grass; mowing stirs up pollen. Keep windows closed at night.  Keep car windows closed while driving. Minimize morning activities outdoors, a time when pollen counts are usually at their highest. Stay indoors as much as possible when pollen counts or humidity is high and on windy days when pollen tends to remain in the air longer. Use air conditioning when possible.  Many air conditioners have filters that trap the pollen spores. Use a HEPA room air filter to remove pollen form the indoor air you breathe.  Control of Dog or Cat Allergen Avoidance is the best way to manage a dog or cat allergy. If you have a dog or cat and are allergic to dog or cats, consider removing the dog or cat from the home. If you have a dog or cat but don't want to find it a new home, or if your family wants a pet even though someone in the household is allergic, here are some strategies that may help keep symptoms at bay:  Keep the pet out of your bedroom and restrict it to only a few rooms. Be advised that keeping the dog or cat in only one room will not limit the allergens to that room. Don't pet, hug or kiss the dog or cat; if you do, wash your hands with soap and water. High-efficiency particulate air (HEPA) cleaners run continuously in a bedroom or living room can reduce allergen levels over time.  Regular use of a high-efficiency vacuum cleaner or a central vacuum can reduce allergen levels. Giving your dog or cat a bath at least once a week can reduce airborne allergen.  Control of Mold Allergen Mold and fungi can grow on a variety of surfaces provided certain temperature and moisture conditions exist.  Outdoor molds grow on plants, decaying vegetation and soil.  The major outdoor mold, Alternaria and  Cladosporium, are found in very high numbers during hot and dry conditions.  Generally, a late Summer - Fall peak is seen for common outdoor fungal spores.  Rain will temporarily lower outdoor mold spore count, but counts rise rapidly when the rainy period ends.  The most important indoor molds are Aspergillus and Penicillium.  Dark, humid and poorly ventilated basements are ideal sites for mold growth.  The next most common sites of mold growth are the bathroom and the kitchen.  Outdoor [employer] Use air conditioning and keep windows closed Avoid exposure to decaying vegetation. Avoid leaf raking. Avoid grain handling. Consider wearing a face mask if working in moldy areas.  Indoor Mold Control Maintain humidity below 50%. Clean washable surfaces with 5% bleach solution. Remove sources e.g. Contaminated carpets.   Control of Dust Mite Allergen Dust mites play a major role in allergic asthma and rhinitis. They occur in environments with high humidity wherever human skin is found. Dust mites absorb humidity from the atmosphere (ie, they do not drink) and feed on organic matter (including shed human and animal skin). Dust mites are a microscopic type of insect that you cannot see with the naked eye. High levels of dust mites have been detected from mattresses, pillows, carpets, upholstered furniture, bed covers, clothes, soft toys and any woven material. The principal allergen of the dust mite is found in its feces. A gram of dust may contain 1,000 mites and 250,000 fecal particles. Mite antigen is easily measured in the air during house cleaning activities. Dust mites do not bite and do not cause harm to humans, other than by triggering allergies/asthma.  Ways to decrease your exposure to dust mites in your home:  1. Encase mattresses, box springs and pillows with a mite-impermeable barrier or cover  2. Wash sheets, blankets and drapes weekly in hot water (130 F) with detergent and dry them  in a dryer on the hot setting.  3. Have the room cleaned frequently with a vacuum cleaner and a damp dust-mop. For carpeting or rugs, vacuuming with a vacuum cleaner equipped with a high-efficiency particulate air (HEPA) filter. The dust mite allergic individual should not be in a room which is being cleaned and should wait 1 hour after cleaning before going into the room.  4. Do not sleep on upholstered furniture (eg, couches).  5. If possible removing carpeting, upholstered furniture and drapery from the home is ideal. Horizontal blinds should be eliminated in the rooms where the person spends the most time (bedroom, study, television room). Washable vinyl, roller-type shades are optimal.  6. Remove all non-washable stuffed toys from the bedroom. Wash stuffed toys weekly like sheets and blankets above.  7. Reduce indoor humidity to less than 50%. Inexpensive humidity monitors can be purchased at most hardware stores. Do not use a humidifier as can make the problem worse and are not recommended.

## 2022-09-01 NOTE — Telephone Encounter (Signed)
Patient notified that she would need to come in person for her PV to be weighed due to her BMI being so close to meeting requirement for the hospital setting. PT understands her PV is 5/22 at 11:00 AM.

## 2022-09-04 ENCOUNTER — Other Ambulatory Visit: Payer: Self-pay

## 2022-09-04 ENCOUNTER — Encounter (HOSPITAL_COMMUNITY): Payer: Self-pay | Admitting: Emergency Medicine

## 2022-09-04 ENCOUNTER — Ambulatory Visit (HOSPITAL_COMMUNITY)
Admission: EM | Admit: 2022-09-04 | Discharge: 2022-09-04 | Disposition: A | Discharge status: Home / Self Care | Payer: 59 | Attending: Emergency Medicine | Admitting: Emergency Medicine

## 2022-09-04 DIAGNOSIS — J069 Acute upper respiratory infection, unspecified: Secondary | ICD-10-CM | POA: Diagnosis not present

## 2022-09-04 LAB — POCT RAPID STREP A (OFFICE): Rapid Strep A Screen: NEGATIVE

## 2022-09-04 MED ORDER — GUAIFENESIN ER 600 MG PO TB12: 600.0000 mg | ORAL_TABLET | Freq: Two times a day (BID) | ORAL | 0 refills | Status: AC

## 2022-09-04 MED ORDER — PROMETHAZINE-DM 6.25-15 MG/5ML PO SYRP: 2.5000 mL | ORAL_SOLUTION | Freq: Four times a day (QID) | ORAL | 0 refills | Status: DC | PRN

## 2022-09-04 NOTE — ED Provider Notes (Signed)
[facility]    CSN: [id number] Arrival date & time: 09/04/22  1204      History   Chief Complaint Chief Complaint  Patient presents with   Sore Throat    HPI Maria Nelson is a 67 y.o. female.  Here with 4 day history of cough, dry but feels there is mucous in the chest that needs to come up Also sore throat with swallowing, scratchy feeling Head hurts when she coughs but not at rest No fever or chills  History of COPD. Has used her breztri as prescribed that helps. Denies any wheezing or shortness of breath  No chest pain or tightness  Sick contacts with her grandchildren   Past Medical History:  Diagnosis Date   ANGIOEDEMA 12/16/2009   Asthma    Carpal tunnel syndrome 06/23/2011   Patient notes history of bilateral carpal tunnel syndrome.  Now has symptoms of right hand carpal tunnel.    COPD (chronic obstructive pulmonary disease) (HCC)    secondary to tobacco use // No PFTs on file   COVID-19    Dyspepsia 08/24/2017   ETOH abuse    Pt stopped in 3/08   Gastrointestinal hemorrhage    secondary to PUD on March 2008   OSA on CPAP    noncompliant with CPAP (2/2 it being depressing)   Peptic ulcer disease    +H. pylori (antigen)- Dr. Juanda Chance- treated   STRESS INCONTINENCE 01/24/2007   Tobacco abuse    quit in 2010   Urinary incontinence    Urticaria    Urticaria 07/01/2015    Patient Active Problem List   Diagnosis Date Noted   Chronic urticaria 09/01/2022   Aortic atherosclerosis (HCC) 07/06/2022   Encounter for screening mammogram for malignant neoplasm of breast 07/06/2022   Tinea pedis of right foot 07/06/2022   Encounter for screening colonoscopy 07/06/2022   Other fatigue 07/06/2022   Stage 3a chronic kidney disease (HCC) 03/15/2022   Pulmonary nodule less than 6 mm determined by computed tomography of lung 12/03/2021   Low iron 12/03/2021   Benign paroxysmal positional vertigo 09/03/2021   Healthcare maintenance  01/28/2021   Hyperlipidemia 11/22/2019   Adnexal pain 09/06/2018   Paroxysmal atrial fibrillation (HCC) 08/24/2017   Chronic pain of both knees 08/16/2016   Back pain 06/23/2016   Urticaria 07/01/2015   Seasonal and perennial allergic rhinitis 03/04/2015   Asthma with COPD 06/19/2014   Blood pressure elevated without history of HTN 03/05/2014   HNP (herniated nucleus pulposus), lumbar 04/16/2013   Tenosynovitis, de Quervain 11/15/2012   Morbid obesity with BMI of 45.0-49.9, adult (HCC) 09/08/2012   Carpal tunnel syndrome 06/23/2011   Shoulder pain 09/02/2010   Sleep apnea 09/02/2010   GERD 12/24/2009   Urticaria/angioedema 12/16/2009   STRESS INCONTINENCE 01/24/2007   History of peptic ulcer disease 07/26/2006    Past Surgical History:  Procedure Laterality Date   BREAST EXCISIONAL BIOPSY Right 1994   MANDIBLE SURGERY     due to trauma   TUBAL LIGATION      OB History   No obstetric history on file.      Home Medications    Prior to Admission medications   Medication Sig Start Date End Date Taking? Authorizing Provider  guaiFENesin (MUCINEX) 600 MG 12 hr tablet Take 1 tablet (600 mg total) by mouth 2 (two) times daily for 5 days. 09/04/22 09/09/22 Yes Sonita Michiels, Lurena Joiner, PA-C  promethazine-dextromethorphan (PROMETHAZINE-DM) 6.25-15 MG/5ML syrup Take 2.5 mLs by mouth 4 (four)  times daily as needed for cough. 09/04/22  Yes Sovereign Ramiro, Lurena Joiner, PA-C  albuterol (PROVENTIL) (2.5 MG/3ML) 0.083% nebulizer solution Use 1 vial in nebulizer up to 3 times a day when albuterol inhaler not working. If no better or getting worse, call 911 or physician line. 07/05/22   Dickie La, MD  albuterol (VENTOLIN HFA) 108 (90 Base) MCG/ACT inhaler Inhale 2 puffs into the lungs every 6 (six) hours as needed for wheezing or shortness of breath. 11/02/21   [name], MD  Budeson-Glycopyrrol-Formoterol (BREZTRI AEROSPHERE) 160-9-4.8 MCG/ACT AERO Inhale 2 puffs into the lungs 2 (two) times daily. 05/03/22    Hetty Blend, FNP  clotrimazole (LOTRIMIN) 1 % cream Apply to right foot and between right toes twice daily for two weeks. 07/06/22   Dickie La, MD  EPINEPHrine (EPIPEN 2-PAK) 0.3 mg/0.3 mL IJ SOAJ injection Inject 0.3 mg into the muscle as needed (for allergic reaction). 11/02/21   [name], MD  pantoprazole (PROTONIX) 40 MG tablet Take 1 tablet by mouth once daily 06/23/22   Dickie La, MD  Vitamin D, Cholecalciferol, 25 MCG (1000 UT) CAPS Take 1 capsule by mouth daily. 07/08/22   Dickie La, MD    Family History Family History  Problem Relation Age of Onset   Diabetes Mother    Coronary artery disease Mother 38       requiring 5 vessel CABG   Diabetes Sister    Breast cancer Sister 5       died 2/2 breast ca age 32-60    Social History Social History   Tobacco Use   Smoking status: Former    Packs/day: 0.50    Years: 40.00    Additional pack years: 0.00    Total pack years: 20.00    Types: Cigarettes    Quit date: 12/04/2010    Years since quitting: 11.7   Smokeless tobacco: Never  Vaping Use   Vaping Use: Never used  Substance Use Topics   Alcohol use: Not Currently    Alcohol/week: 2.0 standard drinks of alcohol    Types: 1 Glasses of wine, 1 Cans of beer per week    Comment: 1-2 glasses per week, beer 1 per week   Drug use: No     Allergies   Bee venom, Aspirin, and Other   Review of Systems Review of Systems As per HPI   Physical Exam Triage Vital Signs ED Triage Vitals  Enc Vitals Group     BP 09/04/22 1349 122/82     Pulse Rate 09/04/22 1349 61     Resp 09/04/22 1349 20     Temp 09/04/22 1349 98.5 F (36.9 C)     Temp Source 09/04/22 1349 Oral     SpO2 09/04/22 1349 96 %     Weight --      Height --      Head Circumference --      Peak Flow --      Pain Score 09/04/22 1347 8     Pain Loc --      Pain Edu? --      Excl. in GC? --    No data found.  Updated Vital Signs BP 122/82 (BP Location: Left Arm) Comment (BP Location): large cuff   Pulse 61   Temp 98.5 F (36.9 C) (Oral)   Resp 20   LMP 04/19/2004 (Approximate)   SpO2 96%     Physical Exam Vitals and nursing note reviewed.  Constitutional:  General: She is not in acute distress. HENT:     Nose: No congestion or rhinorrhea.     Mouth/Throat:     Mouth: Mucous membranes are moist.     Pharynx: Oropharynx is clear. Posterior oropharyngeal erythema (mild) present.  Eyes:     Conjunctiva/sclera: Conjunctivae normal.  Cardiovascular:     Rate and Rhythm: Normal rate and regular rhythm.     Pulses: Normal pulses.     Heart sounds: Normal heart sounds.  Pulmonary:     Effort: Pulmonary effort is normal.     Breath sounds: No decreased air movement.     Comments: Decreased lung sounds throughout but moving air. No rales, wheezing, rhonchi Musculoskeletal:     Cervical back: Normal range of motion. Tenderness (mild, anterior cervical) present. No rigidity.  Lymphadenopathy:     Cervical: No cervical adenopathy.  Skin:    General: Skin is warm and dry.  Neurological:     Mental Status: She is alert and oriented to person, place, and time.     UC Treatments / Results  Labs (all labs ordered are listed, but only abnormal results are displayed) Labs Reviewed  CULTURE, GROUP A STREP [facility])  POCT RAPID STREP A (OFFICE)    EKG   Radiology No results found.  Procedures Procedures (including critical care time)  Medications Ordered in UC Medications - No data to display  Initial Impression / Assessment and Plan / UC Course  I have reviewed the triage vital signs and the nursing notes.  Pertinent labs & imaging results that were available during my care of the patient were reviewed by me and considered in my medical decision making (see chart for details).  Afebrile, stable vitals. No acute distress Strep is negative in clinic, culture pending.  No indication for chest xray at this time.  Suspect viral illness. Recommend symptomatic care.  Nebulizer TID prn, continue Breztri as prescribed. Can add mucinex BID, promethazine DM QID prn. Drowsy precautions discussed. Recommend trying these interventions for the next several days and monitoring for improvement.  Discussed with any worsening to return for evaluation. ED precautions   Final Clinical Impressions(s) / UC Diagnoses   Final diagnoses:  Viral URI with cough     Discharge Instructions      The strep test was negative today.  We will call you if anything grows on throat culture and requires antibiotics.  This can take 2 to 3 days.  Tylenol can be used for headache/pain. You can use 650 mg every 6 hours as needed.  Please use nebulizer treatment 3 times daily (every 6 hours) for the next several days. Then continue as needed. This will help with cough. Please also continue your Breztri twice daily.  Cough syrup can be used up to 4 times daily. If the medicine makes you drowsy, use only once before bedtime.   If you want to also add the mucinex (guaifanacin), this may help to pull up any mucous sitting in the chest.  I recommend using the combination of these interventions over the next several days.  Hopefully you will notice an improvement shortly.  If you notice symptoms are persisting or worsening over the week, please return for evaluation.     ED Prescriptions     Medication Sig Dispense Auth. Provider   promethazine-dextromethorphan (PROMETHAZINE-DM) 6.25-15 MG/5ML syrup Take 2.5 mLs by mouth 4 (four) times daily as needed for cough. 240 mL Dominic Mahaney, PA-C   guaiFENesin (MUCINEX) 600 MG 12  hr tablet Take 1 tablet (600 mg total) by mouth 2 (two) times daily for 5 days. 10 tablet Natashia Roseman, Lurena Joiner, PA-C      PDMP not reviewed this encounter.   Nashira Mcglynn, Lurena Joiner, [place] 09/04/22 1459

## 2022-09-04 NOTE — ED Triage Notes (Signed)
Sore throat wince Wednesday.  Drinking lemon tea, taken alka seltzer plus.  Reports no improvement.  Patient has a cough, scratchy throat.  Head hurts with coughing.  Reports phlegm is yellow.  No known fever

## 2022-09-04 NOTE — Discharge Instructions (Addendum)
The strep test was negative today.  We will call you if anything grows on throat culture and requires antibiotics.  This can take 2 to 3 days.  Tylenol can be used for headache/pain. You can use 650 mg every 6 hours as needed.  Please use nebulizer treatment 3 times daily (every 6 hours) for the next several days. Then continue as needed. This will help with cough. Please also continue your Breztri twice daily.  Cough syrup can be used up to 4 times daily. If the medicine makes you drowsy, use only once before bedtime.   If you want to also add the mucinex (guaifanacin), this may help to pull up any mucous sitting in the chest.  I recommend using the combination of these interventions over the next several days.  Hopefully you will notice an improvement shortly.  If you notice symptoms are persisting or worsening over the week, please return for evaluation.

## 2022-09-05 LAB — CULTURE, GROUP A STREP (THRC)

## 2022-09-06 ENCOUNTER — Ambulatory Visit (HOSPITAL_COMMUNITY)
Admission: RE | Admit: 2022-09-06 | Discharge: 2022-09-06 | Disposition: A | Discharge status: Home / Self Care | Payer: 59 | Source: Ambulatory Visit | Attending: Internal Medicine | Admitting: Internal Medicine

## 2022-09-06 DIAGNOSIS — R911 Solitary pulmonary nodule: Secondary | ICD-10-CM

## 2022-09-06 DIAGNOSIS — J432 Centrilobular emphysema: Secondary | ICD-10-CM | POA: Diagnosis not present

## 2022-09-07 LAB — CULTURE, GROUP A STREP (THRC)

## 2022-09-08 ENCOUNTER — Ambulatory Visit (AMBULATORY_SURGERY_CENTER): Payer: 59

## 2022-09-08 VITALS — Ht 60.0 in | Wt 255.0 lb

## 2022-09-08 DIAGNOSIS — Z1211 Encounter for screening for malignant neoplasm of colon: Secondary | ICD-10-CM

## 2022-09-08 MED ORDER — NA SULFATE-K SULFATE-MG SULF 17.5-3.13-1.6 GM/177ML PO SOLN: 1.0000 | Freq: Once | ORAL | 0 refills | Status: AC

## 2022-09-08 NOTE — Progress Notes (Signed)
Pre visit completed via phone call; Patient verified name, DOB, and address; Patient weight at time of PV=255 lbs and height 5'0"= leading BMI to 49.80  No egg or soy allergy known to patient;  No issues known to pt with past sedation with any surgeries or procedures; Patient denies ever being told they had issues or difficulty with intubation;  No FH of Malignant Hyperthermia; Pt is not on diet pills; Pt is not on home 02;  Pt is not on blood thinners;  Pt denies issues with constipation;  No A fib or A flutter; Have any cardiac testing pending--NO Pt instructed to use Singlecare.com or GoodRx for a price reduction on prep;   Insurance verified during PV appt=[employer] Dual Completed with [place] [employer]  Patient's chart reviewed by Cathlyn Parsons CNRA prior to previsit and patient appropriate for the [facility].  Previsit completed and red dot placed by patient's name on their procedure day (on provider's schedule).    Instructions and GoodRx printed and given to patient during PV appt; Patient allowed RN to change the preferred pharmacy to [facility] for the Suprep coupon to be able to be used;

## 2022-09-09 NOTE — Progress Notes (Signed)
Reviewed findings with Ms. Paulis 5/23.

## 2022-09-15 ENCOUNTER — Ambulatory Visit (INDEPENDENT_AMBULATORY_CARE_PROVIDER_SITE_OTHER): Payer: 59 | Admitting: *Deleted

## 2022-09-15 DIAGNOSIS — L501 Idiopathic urticaria: Secondary | ICD-10-CM | POA: Diagnosis not present

## 2022-09-21 ENCOUNTER — Ambulatory Visit (INDEPENDENT_AMBULATORY_CARE_PROVIDER_SITE_OTHER): Payer: 59 | Admitting: *Deleted

## 2022-09-21 DIAGNOSIS — Z111 Encounter for screening for respiratory tuberculosis: Secondary | ICD-10-CM

## 2022-09-21 NOTE — Progress Notes (Signed)
   Patient presents for PPD placement for work Denies previous positive TB test  Denies known exposure to TB   Tuberculin skin test applied to left ventral forearm.  Patient aware that she needs to return in 48-72 hours for PPD reading   She will come back on 6/7 at ~ 0900 for PPD reading  L. Darrell Leonhardt, BSN, RN-BC

## 2022-09-22 ENCOUNTER — Ambulatory Visit (INDEPENDENT_AMBULATORY_CARE_PROVIDER_SITE_OTHER): Payer: 59

## 2022-09-22 VITALS — Ht 62.0 in | Wt 255.0 lb

## 2022-09-22 DIAGNOSIS — Z0000 Encounter for general adult medical examination without abnormal findings: Secondary | ICD-10-CM

## 2022-09-22 NOTE — Patient Instructions (Signed)
[name] , Thank you for taking time to come for your Medicare Wellness Visit. I appreciate your ongoing commitment to your health goals. Please review the following plan we discussed and let me know if I can assist you in the future.   These are the goals we discussed:  Goals      Remain active and independent     Weight < 240 lb (108.863 kg)        This is a list of the screening recommended for you and due dates:  Health Maintenance  Topic Date Due   Zoster (Shingles) Vaccine (1 of 2) Never done   Colon Cancer Screening  07/24/2019   DTaP/Tdap/Td vaccine (2 - Td or Tdap) 06/22/2021   COVID-19 Vaccine (2 - 2023-24 season) 12/18/2021   Flu Shot  11/18/2022   Screening for Lung Cancer  09/06/2023   Medicare Annual Wellness Visit  09/22/2023   Mammogram  01/02/2024   Pneumonia Vaccine  Completed   DEXA scan (bone density measurement)  Completed   Hepatitis C Screening  Completed   HPV Vaccine  Aged Out    Advanced directives: Information on Advanced Care Planning can be found at [facility] Advance Health Care Directives Advance Health Care Directives ([email or web address]  Please bring a copy of your health care power of attorney and living will to the office to be added to your chart at your convenience.   Conditions/risks identified: Aim for 30 minutes of exercise or brisk walking, 6-8 glasses of water, and 5 servings of fruits and vegetables each day.   Next appointment: Follow up in one year for your annual wellness visit    Preventive Care 65 Years and Older, Female Preventive care refers to lifestyle choices and visits with your health care provider that can promote health and wellness. What does preventive care include? A yearly physical exam. This is also called an annual well check. Dental exams once or twice a year. Routine eye exams. Ask your health care provider how often you should have your eyes checked. Personal lifestyle choices,  including: Daily care of your teeth and gums. Regular physical activity. Eating a healthy diet. Avoiding tobacco and drug use. Limiting alcohol use. Practicing safe sex. Taking low-dose aspirin every day. Taking vitamin and mineral supplements as recommended by your health care provider. What happens during an annual well check? The services and screenings done by your health care provider during your annual well check will depend on your age, overall health, lifestyle risk factors, and family history of disease. Counseling  Your health care provider may ask you questions about your: Alcohol use. Tobacco use. Drug use. Emotional well-being. Home and relationship well-being. Sexual activity. Eating habits. History of falls. Memory and ability to understand (cognition). Work and work Astronomer. Reproductive health. Screening  You may have the following tests or measurements: Height, weight, and BMI. Blood pressure. Lipid and cholesterol levels. These may be checked every 5 years, or more frequently if you are over 55 years old. Skin check. Lung cancer screening. You may have this screening every year starting at age 5 if you have a 30-pack-year history of smoking and currently smoke or have quit within the past 15 years. Fecal occult blood test (FOBT) of the stool. You may have this test every year starting at age 58. Flexible sigmoidoscopy or colonoscopy. You may have a sigmoidoscopy every 5 years or a colonoscopy every 10 years starting at age 32. Hepatitis C blood test. Hepatitis  B blood test. Sexually transmitted disease (STD) testing. Diabetes screening. This is done by checking your blood sugar (glucose) after you have not eaten for a while (fasting). You may have this done every 1-3 years. Bone density scan. This is done to screen for osteoporosis. You may have this done starting at age 61. Mammogram. This may be done every 1-2 years. Talk to your health care provider  about how often you should have regular mammograms. Talk with your health care provider about your test results, treatment options, and if necessary, the need for more tests. Vaccines  Your health care provider may recommend certain vaccines, such as: Influenza vaccine. This is recommended every year. Tetanus, diphtheria, and acellular pertussis (Tdap, Td) vaccine. You may need a Td booster every 10 years. Zoster vaccine. You may need this after age 21. Pneumococcal 13-valent conjugate (PCV13) vaccine. One dose is recommended after age 70. Pneumococcal polysaccharide (PPSV23) vaccine. One dose is recommended after age 47. Talk to your health care provider about which screenings and vaccines you need and how often you need them. This information is not intended to replace advice given to you by your health care provider. Make sure you discuss any questions you have with your health care provider. Document Released: 05/02/2015 Document Revised: 12/24/2015 Document Reviewed: 02/04/2015 Elsevier Interactive Patient Education  2017 [employer].  Fall Prevention in the Home Falls can cause injuries. They can happen to people of all ages. There are many things you can do to make your home safe and to help prevent falls. What can I do on the outside of my home? Regularly fix the edges of walkways and driveways and fix any cracks. Remove anything that might make you trip as you walk through a door, such as a raised step or threshold. Trim any bushes or trees on the path to your home. Use bright outdoor lighting. Clear any walking paths of anything that might make someone trip, such as rocks or tools. Regularly check to see if handrails are loose or broken. Make sure that both sides of any steps have handrails. Any raised decks and porches should have guardrails on the edges. Have any leaves, snow, or ice cleared regularly. Use sand or salt on walking paths during winter. Clean up any spills in  your garage right away. This includes oil or grease spills. What can I do in the bathroom? Use night lights. Install grab bars by the toilet and in the tub and shower. Do not use towel bars as grab bars. Use non-skid mats or decals in the tub or shower. If you need to sit down in the shower, use a plastic, non-slip stool. Keep the floor dry. Clean up any water that spills on the floor as soon as it happens. Remove soap buildup in the tub or shower regularly. Attach bath mats securely with double-sided non-slip rug tape. Do not have throw rugs and other things on the floor that can make you trip. What can I do in the bedroom? Use night lights. Make sure that you have a light by your bed that is easy to reach. Do not use any sheets or blankets that are too big for your bed. They should not hang down onto the floor. Have a firm chair that has side arms. You can use this for support while you get dressed. Do not have throw rugs and other things on the floor that can make you trip. What can I do in the kitchen? Clean up any  spills right away. Avoid walking on wet floors. Keep items that you use a lot in easy-to-reach places. If you need to reach something above you, use a strong step stool that has a grab bar. Keep electrical cords out of the way. Do not use floor polish or wax that makes floors slippery. If you must use wax, use non-skid floor wax. Do not have throw rugs and other things on the floor that can make you trip. What can I do with my stairs? Do not leave any items on the stairs. Make sure that there are handrails on both sides of the stairs and use them. Fix handrails that are broken or loose. Make sure that handrails are as long as the stairways. Check any carpeting to make sure that it is firmly attached to the stairs. Fix any carpet that is loose or worn. Avoid having throw rugs at the top or bottom of the stairs. If you do have throw rugs, attach them to the floor with carpet  tape. Make sure that you have a light switch at the top of the stairs and the bottom of the stairs. If you do not have them, ask someone to add them for you. What else can I do to help prevent falls? Wear shoes that: Do not have high heels. Have rubber bottoms. Are comfortable and fit you well. Are closed at the toe. Do not wear sandals. If you use a stepladder: Make sure that it is fully opened. Do not climb a closed stepladder. Make sure that both sides of the stepladder are locked into place. Ask someone to hold it for you, if possible. Clearly mark and make sure that you can see: Any grab bars or handrails. First and last steps. Where the edge of each step is. Use tools that help you move around (mobility aids) if they are needed. These include: Canes. Walkers. Scooters. Crutches. Turn on the lights when you go into a dark area. Replace any light bulbs as soon as they burn out. Set up your furniture so you have a clear path. Avoid moving your furniture around. If any of your floors are uneven, fix them. If there are any pets around you, be aware of where they are. Review your medicines with your doctor. Some medicines can make you feel dizzy. This can increase your chance of falling. Ask your doctor what other things that you can do to help prevent falls. This information is not intended to replace advice given to you by your health care provider. Make sure you discuss any questions you have with your health care provider. Document Released: 01/30/2009 Document Revised: 09/11/2015 Document Reviewed: 05/10/2014 Elsevier Interactive Patient Education  2017 [employer].

## 2022-09-22 NOTE — Progress Notes (Signed)
Subjective:   Maria Nelson is a 67 y.o. female who presents for Medicare Annual (Subsequent) preventive examination.  I connected with  Michelle Piper on 09/22/22 by a audio enabled telemedicine application and verified that I am speaking with the correct person using two identifiers.  Patient Location: Home  Provider Location: Home Office  I discussed the limitations of evaluation and management by telemedicine. The patient expressed understanding and agreed to proceed.  Review of Systems     Cardiac Risk Factors include: advanced age (>7men, >48 women);sedentary lifestyle     Objective:    Today's Vitals   09/22/22 1610  Weight: 255 lb (115.7 kg)  Height: 5\' 2"  (1.575 m)   Body mass index is 46.64 kg/m.     09/22/2022    4:14 PM 07/05/2022    9:36 AM 03/15/2022   11:12 AM 12/03/2021    2:09 PM 11/26/2021    8:45 PM 09/09/2021   12:17 PM 09/02/2021    2:05 PM  Advanced Directives  Does Patient Have a Medical Advance Directive? No No No No No No No  Would patient like information on creating a medical advance directive? Yes (MAU/Ambulatory/Procedural Areas - Information given) No - Patient declined No - Patient declined No - Patient declined  No - Patient declined No - Patient declined    Current Medications (verified) Outpatient Encounter Medications as of 09/22/2022  Medication Sig   albuterol (PROVENTIL) (2.5 MG/3ML) 0.083% nebulizer solution Use 1 vial in nebulizer up to 3 times a day when albuterol inhaler not working. If no better or getting worse, call 911 or physician line.   albuterol (VENTOLIN HFA) 108 (90 Base) MCG/ACT inhaler Inhale 2 puffs into the lungs every 6 (six) hours as needed for wheezing or shortness of breath.   Budeson-Glycopyrrol-Formoterol (BREZTRI AEROSPHERE) 160-9-4.8 MCG/ACT AERO Inhale 2 puffs into the lungs 2 (two) times daily.   clotrimazole (LOTRIMIN) 1 % cream Apply to right foot and between right toes twice  daily for two weeks.   EPINEPHrine (EPIPEN 2-PAK) 0.3 mg/0.3 mL IJ SOAJ injection Inject 0.3 mg into the muscle as needed (for allergic reaction).   pantoprazole (PROTONIX) 40 MG tablet Take 1 tablet by mouth once daily   Vitamin D, Cholecalciferol, 25 MCG (1000 UT) CAPS Take 1 capsule by mouth daily.   XOLAIR 150 MG/ML prefilled syringe Inject 150 mg into the skin every 28 (twenty-eight) days.   Facility-Administered Encounter Medications as of 09/22/2022  Medication   omalizumab Geoffry Paradise) prefilled syringe 300 mg    Allergies (verified) Bee venom, Aspirin, and Other   History: Past Medical History:  Diagnosis Date   ANGIOEDEMA 12/16/2009   Asthma    Carpal tunnel syndrome 06/23/2011   Patient notes history of bilateral carpal tunnel syndrome.  Now has symptoms of right hand carpal tunnel.    COPD (chronic obstructive pulmonary disease) (HCC)    secondary to tobacco use // No PFTs on file   COVID-19    Dyspepsia 08/24/2017   Emphysema of lung (HCC)    not on O2 at home (09/08/2022)   ETOH abuse    Pt stopped in 3/08   Gastrointestinal hemorrhage    secondary to PUD on March 2008   GERD (gastroesophageal reflux disease)    on meds   OSA on CPAP    noncompliant with CPAP (2/2 it being depressing)   Peptic ulcer disease    +H. pylori (antigen)- Dr. Juanda Chance- treated   STRESS INCONTINENCE 01/24/2007  Tobacco abuse    quit in 2010   Urinary incontinence    Urticaria    Urticaria 07/01/2015   Past Surgical History:  Procedure Laterality Date   BREAST EXCISIONAL BIOPSY Right 1994   MANDIBLE SURGERY     due to trauma   TUBAL LIGATION     Family History  Problem Relation Age of Onset   Diabetes Mother    Coronary artery disease Mother 75       requiring 5 vessel CABG   Diabetes Sister    Breast cancer Sister 3       died 2/2 breast ca age 70-60   Colon polyps Neg Hx    Colon cancer Neg Hx    Esophageal cancer Neg Hx    Rectal cancer Neg Hx    Stomach cancer Neg Hx     Social History   Socioeconomic History   Marital status: Single    Spouse name: Not on file   Number of children: 3   Years of education: 12th grade   Highest education level: Not on file  Occupational History   Occupation: Disability    Comment: previously worked in [employer] had to stop 2/2 occupational exposures leading to exacerbations of asthma   Tobacco Use   Smoking status: Former    Packs/day: 0.50    Years: 40.00    Additional pack years: 0.00    Total pack years: 20.00    Types: Cigarettes    Quit date: 12/04/2010    Years since quitting: 11.8   Smokeless tobacco: Never  Vaping Use   Vaping Use: Never used  Substance and Sexual Activity   Alcohol use: Not Currently   Drug use: No   Sexual activity: Not Currently  Other Topics Concern   Not on file  Social History Narrative   Pt is not working now, was previously a Conservation officer, nature at [employer].      Pt lives with her son      Pt has received financial assistance approval for 100% discount at [facility] and has [facility] card.    Social Determinants of Health   Financial Resource Strain: Low Risk  (09/22/2022)   Overall Financial Resource Strain (CARDIA)    Difficulty of Paying Living Expenses: Not hard at all  Food Insecurity: No Food Insecurity (09/22/2022)   Hunger Vital Sign    Worried About Running Out of Food in the Last Year: Never true    Ran Out of Food in the Last Year: Never true  Transportation Needs: No Transportation Needs (09/22/2022)   PRAPARE - Administrator, Civil Service (Medical): No    Lack of Transportation (Non-Medical): No  Physical Activity: Sufficiently Active (09/22/2022)   Exercise Vital Sign    Days of Exercise per Week: 3 days    Minutes of Exercise per Session: 60 min  Stress: No Stress Concern Present (09/22/2022)   [employer] of Occupational Health - Occupational Stress Questionnaire    Feeling of Stress : Not at all  Social Connections: Moderately Isolated  (09/22/2022)   Social Connection and Isolation Panel [NHANES]    Frequency of Communication with Friends and Family: More than three times a week    Frequency of Social Gatherings with Friends and Family: Twice a week    Attends Religious Services: 1 to 4 times per year    Active Member of [employer] or Organizations: No    Attends Banker Meetings: Never  Marital Status: Divorced    Tobacco Counseling Counseling given: Not Answered   Clinical Intake:  Pre-visit preparation completed: Yes  Pain : No/denies pain  Diabetes: No  How often do you need to have someone help you when you read instructions, pamphlets, or other written materials from your doctor or pharmacy?: 1 - Never  Diabetic?No   Interpreter Needed?: No  Information entered by :: Kandis Fantasia LPN   Activities of Daily Living    09/22/2022    4:14 PM 07/05/2022    9:36 AM  In your present state of health, do you have any difficulty performing the following activities:  Hearing? 0 0  Vision? 0 0  Difficulty concentrating or making decisions? 0 0  Walking or climbing stairs? 0 1  Dressing or bathing? 0 0  Doing errands, shopping? 0 0  Preparing Food and eating ? N   Using the Toilet? N   In the past six months, have you accidently leaked urine? N   Do you have problems with loss of bowel control? N   Managing your Medications? N   Managing your Finances? N   Housekeeping or managing your Housekeeping? N     Patient Care Team: Dickie La, MD as PCP - General (Internal Medicine)  Indicate any recent Medical Services you may have received from other than [facility] providers in the past year (date may be approximate).     Assessment:   This is a routine wellness examination for Kellen.  Hearing/Vision screen Hearing Screening - Comments:: Denies hearing difficulties   Vision Screening - Comments:: No vision problems; will schedule routine eye exam soon    Dietary issues and exercise  activities discussed: Current Exercise Habits: Home exercise routine;Structured exercise class, Type of exercise: walking;stretching, Time (Minutes): 60, Frequency (Times/Week): 3, Weekly Exercise (Minutes/Week): 180, Intensity: Mild   Goals Addressed             This Visit's Progress    Remain active and independent        Depression Screen    09/22/2022    4:13 PM 07/05/2022    9:36 AM 03/15/2022   11:12 AM 09/02/2021    2:03 PM 08/03/2021    2:23 PM 07/13/2021    2:55 PM 01/28/2021    4:41 PM  PHQ 2/9 Scores  PHQ - 2 Score 0 0 0 0 0 0 0  PHQ- 9 Score    0 0 0 1    Fall Risk    09/22/2022    4:14 PM 07/05/2022    9:36 AM 03/15/2022   11:11 AM 09/02/2021    2:03 PM 08/03/2021    2:28 PM  Fall Risk   Falls in the past year? 0 0 0 1 1  Number falls in past yr: 0 0 0 0 0  Injury with Fall? 0 0 0 1 1  Risk for fall due to : Impaired balance/gait;Impaired mobility No Fall Risks No Fall Risks Other (Comment) Impaired balance/gait  Follow up Falls prevention discussed;Education provided;Falls evaluation completed Falls evaluation completed;Falls prevention discussed Falls evaluation completed;Falls prevention discussed Falls evaluation completed;Falls prevention discussed Falls evaluation completed;Falls prevention discussed    FALL RISK PREVENTION PERTAINING TO THE HOME:  Any stairs in or around the home? No  If so, are there any without handrails? No  Home free of loose throw rugs in walkways, pet beds, electrical cords, etc? Yes  Adequate lighting in your home to reduce risk of falls? Yes   ASSISTIVE  DEVICES UTILIZED TO PREVENT FALLS:  Life alert? No  Use of a cane, walker or w/c? No  Grab bars in the bathroom? No  Shower chair or bench in shower? Yes  Elevated toilet seat or a handicapped toilet? Yes   TIMED UP AND GO:  Was the test performed? No . Telephonic visit   Cognitive Function:        09/22/2022    4:14 PM 08/03/2021    2:27 PM  6CIT Screen  What Year? 0  points 0 points  What month? 0 points 0 points  What time? 0 points 0 points  Count back from 20 0 points 0 points  Months in reverse 0 points 0 points  Repeat phrase 0 points 0 points  Total Score 0 points 0 points    Immunizations Immunization History  Administered Date(s) Administered   Fluad Quad(high Dose 65+) 01/28/2021, 03/15/2022   Influenza Whole 01/24/2007, 06/06/2009, 12/24/2009, 12/25/2010   Influenza,inj,Quad PF,6+ Mos 02/14/2013, 03/05/2014, 02/11/2016, 05/10/2018, 12/05/2018   PFIZER(Purple Top)SARS-COV-2 Vaccination 08/21/2019   PNEUMOCOCCAL CONJUGATE-20 07/13/2021   PPD Test 09/21/2022   Pneumococcal Polysaccharide-23 06/06/2009   Tdap 06/23/2011    TDAP status: Due, Education has been provided regarding the importance of this vaccine. Advised may receive this vaccine at local pharmacy or Health Dept. Aware to provide a copy of the vaccination record if obtained from local pharmacy or Health Dept. Verbalized acceptance and understanding.  Pneumococcal vaccine status: Up to date  Covid-19 vaccine status: Information provided on how to obtain vaccines.   Qualifies for Shingles Vaccine? Yes   Zostavax completed No   Shingrix Completed?: No.    Education has been provided regarding the importance of this vaccine. Patient has been advised to call insurance company to determine out of pocket expense if they have not yet received this vaccine. Advised may also receive vaccine at local pharmacy or Health Dept. Verbalized acceptance and understanding.  Screening Tests Health Maintenance  Topic Date Due   Zoster Vaccines- Shingrix (1 of 2) Never done   Colonoscopy  07/24/2019   DTaP/Tdap/Td (2 - Td or Tdap) 06/22/2021   COVID-19 Vaccine (2 - 2023-24 season) 12/18/2021   INFLUENZA VACCINE  11/18/2022   Lung Cancer Screening  09/06/2023   Medicare Annual Wellness (AWV)  09/22/2023   MAMMOGRAM  01/02/2024   Pneumonia Vaccine 9+ Years old  Completed   DEXA SCAN   Completed   Hepatitis C Screening  Completed   HPV VACCINES  Aged Out    Health Maintenance  Health Maintenance Due  Topic Date Due   Zoster Vaccines- Shingrix (1 of 2) Never done   Colonoscopy  07/24/2019   DTaP/Tdap/Td (2 - Td or Tdap) 06/22/2021   COVID-19 Vaccine (2 - 2023-24 season) 12/18/2021    Colorectal cancer screening:  Scheduled for colonoscopy 09/29/22  Mammogram status: Completed 01/01/22. Repeat every year  Bone Density status: Completed 07/17/21. Results reflect: Bone density results: NORMAL. Repeat every 5 years.  Lung Cancer Screening: (Low Dose CT Chest recommended if Age 60-80 years, 30 pack-year currently smoking OR have quit w/in 15years.) does qualify.   Lung Cancer Screening Referral: last 09/06/22  Additional Screening:  Hepatitis C Screening: does qualify; Completed 08/24/17  Vision Screening: Recommended annual ophthalmology exams for early detection of glaucoma and other disorders of the eye. Is the patient up to date with their annual eye exam?  No  Who is the provider or what is the name of the office in which the patient  attends annual eye exams? none If pt is not established with a provider, would they like to be referred to a provider to establish care? No .   Dental Screening: Recommended annual dental exams for proper oral hygiene  Community Resource Referral / Chronic Care Management: CRR required this visit?  No   CCM required this visit?  No      Plan:     I have personally reviewed and noted the following in the patient's chart:   Medical and social history Use of alcohol, tobacco or illicit drugs  Current medications and supplements including opioid prescriptions. Patient is not currently taking opioid prescriptions. Functional ability and status Nutritional status Physical activity Advanced directives List of other physicians Hospitalizations, surgeries, and ER visits in previous 12 months Vitals Screenings to include  cognitive, depression, and falls Referrals and appointments  In addition, I have reviewed and discussed with patient certain preventive protocols, quality metrics, and best practice recommendations. A written personalized care plan for preventive services as well as general preventive health recommendations were provided to patient.     Durwin Nora, [place]   11/17/1912   Due to this being a virtual visit, the after visit summary with patients personalized plan was offered to patient via mail or my-chart. per request, patient was mailed a copy of AVS  Nurse Notes: No concerns

## 2022-09-24 LAB — TB SKIN TEST
Induration: 0 mm
TB Skin Test: NEGATIVE

## 2022-09-28 ENCOUNTER — Telehealth: Payer: Self-pay | Admitting: Gastroenterology

## 2022-09-28 NOTE — Telephone Encounter (Signed)
Plenvu sample left for pt at 2nd floor lobby. New instructions have also been provided. Pt made aware she is agreeable.

## 2022-09-28 NOTE — Telephone Encounter (Signed)
If she can pick up Plenvu bowel prep kit and we have a sample that will be great, if not please give her instructions for MiraLAX bowel prep with Gatorade.  Thank you

## 2022-09-28 NOTE — Telephone Encounter (Signed)
Dr. Lavon Paganini,   This patient took her prep this morning instead of at 6:00 PM in the evening. Looks like she may have spilled it as well. Please advise if there is some alternate she can do for her prep tonight?   I can offer her a plenvu sample if she can pick it up or miralax?

## 2022-09-28 NOTE — Telephone Encounter (Signed)
PT took prep at 6am not 6pm today. She also spilled the prep that she mixed for today and doesn't know where to go please advise.

## 2022-09-29 ENCOUNTER — Encounter: Payer: Self-pay | Admitting: Gastroenterology

## 2022-09-29 ENCOUNTER — Ambulatory Visit (AMBULATORY_SURGERY_CENTER): Payer: 59 | Admitting: Gastroenterology

## 2022-09-29 VITALS — BP 105/84 | HR 66 | Temp 97.3°F | Resp 18 | Ht 60.0 in | Wt 252.0 lb

## 2022-09-29 DIAGNOSIS — D122 Benign neoplasm of ascending colon: Secondary | ICD-10-CM

## 2022-09-29 DIAGNOSIS — K3184 Gastroparesis: Secondary | ICD-10-CM | POA: Diagnosis not present

## 2022-09-29 DIAGNOSIS — G8929 Other chronic pain: Secondary | ICD-10-CM | POA: Diagnosis not present

## 2022-09-29 DIAGNOSIS — R531 Weakness: Secondary | ICD-10-CM | POA: Diagnosis not present

## 2022-09-29 DIAGNOSIS — I951 Orthostatic hypotension: Secondary | ICD-10-CM | POA: Diagnosis not present

## 2022-09-29 DIAGNOSIS — I2510 Atherosclerotic heart disease of native coronary artery without angina pectoris: Secondary | ICD-10-CM | POA: Diagnosis not present

## 2022-09-29 DIAGNOSIS — Z1211 Encounter for screening for malignant neoplasm of colon: Secondary | ICD-10-CM | POA: Diagnosis not present

## 2022-09-29 DIAGNOSIS — K219 Gastro-esophageal reflux disease without esophagitis: Secondary | ICD-10-CM | POA: Diagnosis not present

## 2022-09-29 DIAGNOSIS — R200 Anesthesia of skin: Secondary | ICD-10-CM | POA: Diagnosis not present

## 2022-09-29 DIAGNOSIS — E1122 Type 2 diabetes mellitus with diabetic chronic kidney disease: Secondary | ICD-10-CM | POA: Diagnosis not present

## 2022-09-29 DIAGNOSIS — I160 Hypertensive urgency: Secondary | ICD-10-CM | POA: Diagnosis not present

## 2022-09-29 DIAGNOSIS — Z7902 Long term (current) use of antithrombotics/antiplatelets: Secondary | ICD-10-CM | POA: Diagnosis not present

## 2022-09-29 DIAGNOSIS — N1831 Chronic kidney disease, stage 3a: Secondary | ICD-10-CM | POA: Diagnosis not present

## 2022-09-29 DIAGNOSIS — M79602 Pain in left arm: Secondary | ICD-10-CM | POA: Diagnosis not present

## 2022-09-29 DIAGNOSIS — R29898 Other symptoms and signs involving the musculoskeletal system: Secondary | ICD-10-CM | POA: Diagnosis not present

## 2022-09-29 DIAGNOSIS — R0789 Other chest pain: Secondary | ICD-10-CM | POA: Diagnosis not present

## 2022-09-29 DIAGNOSIS — E1143 Type 2 diabetes mellitus with diabetic autonomic (poly)neuropathy: Secondary | ICD-10-CM | POA: Diagnosis not present

## 2022-09-29 DIAGNOSIS — E1142 Type 2 diabetes mellitus with diabetic polyneuropathy: Secondary | ICD-10-CM | POA: Diagnosis not present

## 2022-09-29 DIAGNOSIS — R202 Paresthesia of skin: Secondary | ICD-10-CM | POA: Diagnosis not present

## 2022-09-29 DIAGNOSIS — Z794 Long term (current) use of insulin: Secondary | ICD-10-CM | POA: Diagnosis not present

## 2022-09-29 DIAGNOSIS — M797 Fibromyalgia: Secondary | ICD-10-CM | POA: Diagnosis not present

## 2022-09-29 DIAGNOSIS — E1165 Type 2 diabetes mellitus with hyperglycemia: Secondary | ICD-10-CM | POA: Diagnosis not present

## 2022-09-29 MED ORDER — SODIUM CHLORIDE 0.9 % IV SOLN
500.0000 mL | Freq: Once | INTRAVENOUS | Status: DC
Start: 2022-09-29 — End: 2022-09-29

## 2022-09-29 NOTE — Progress Notes (Signed)
Called to room to assist during endoscopic procedure.  Patient ID and intended procedure confirmed with present staff. Received instructions for my participation in the procedure from the performing physician.  

## 2022-09-29 NOTE — Progress Notes (Signed)
Uneventful anesthetic. Report to pacu rn. Vss. Care resumed by rn. 

## 2022-09-29 NOTE — Progress Notes (Signed)
Pt's states no medical or surgical changes since previsit or office visit. 

## 2022-09-29 NOTE — Op Note (Signed)
[facility] Patient Name: Maria Nelson Procedure Date: 09/29/2022 11:34 AM MRN: [id number] Endoscopist: Napoleon Form , MD, [id number] Age: 67 Referring MD:  Date of Birth: [birth year] Gender: Female Account #: [id number] Procedure:                Colonoscopy Indications:              Screening for colorectal malignant neoplasm Medicines:                Monitored Anesthesia Care Procedure:                Pre-Anesthesia Assessment:                           - Prior to the procedure, a History and Physical                            was performed, and patient medications and                            allergies were reviewed. The patient's tolerance of                            previous anesthesia was also reviewed. The risks                            and benefits of the procedure and the sedation                            options and risks were discussed with the patient.                            All questions were answered, and informed consent                            was obtained. Prior Anticoagulants: The patient has                            taken no anticoagulant or antiplatelet agents. ASA                            Grade Assessment: III - A patient with severe                            systemic disease. After reviewing the risks and                            benefits, the patient was deemed in satisfactory                            condition to undergo the procedure.                           After obtaining informed consent, the colonoscope  was passed under direct vision. Throughout the                            procedure, the patient's blood pressure, pulse, and                            oxygen saturations were monitored continuously. The                            Olympus PCF-[id number]) Colonoscope was                            introduced through the anus and advanced to the the                             cecum, identified by appendiceal orifice and                            ileocecal valve. The colonoscopy was performed                            without difficulty. The patient tolerated the                            procedure well. The quality of the bowel                            preparation was good. The ileocecal valve,                            appendiceal orifice, and rectum were photographed. Scope In: 11:44:35 AM Scope Out: 11:53:28 AM Scope Withdrawal Time: 0 hours 7 minutes 54 seconds  Total Procedure Duration: 0 hours 8 minutes 53 seconds  Findings:                 The perianal and digital rectal examinations were                            normal.                           Two semi-pedunculated polyps were found in the                            transverse colon and ascending colon. The polyps                            were 5 to 11 mm in size. These polyps were removed                            with a hot snare. Resection and retrieval were                            complete.  Scattered small-mouthed diverticula were found in                            the sigmoid colon and descending colon.                           Non-bleeding external and internal hemorrhoids were                            found during retroflexion. The hemorrhoids were                            medium-sized. Complications:            No immediate complications. Estimated Blood Loss:     Estimated blood loss was minimal. Impression:               - Two 5 to 11 mm polyps in the transverse colon and                            in the ascending colon, removed with a hot snare.                            Resected and retrieved.                           - Diverticulosis in the sigmoid colon and in the                            descending colon.                           - Non-bleeding external and internal hemorrhoids. Recommendation:           - Patient has a contact  number available for                            emergencies. The signs and symptoms of potential                            delayed complications were discussed with the                            patient. Return to normal activities tomorrow.                            Written discharge instructions were provided to the                            patient.                           - Resume previous diet.                           - Continue present medications.                           -  Await pathology results.                           - Repeat colonoscopy in 3 - 5 years for                            surveillance based on pathology results. Napoleon Form, MD 09/29/2022 11:58:08 AM This report has been signed electronically.

## 2022-09-29 NOTE — Progress Notes (Signed)
[facility] History and Physical   Primary Care Physician:  Dickie La, MD   Reason for Procedure:  Colorectal cancer screening  Plan:    Screening colonoscopy with possible interventions as needed     HPI: Maria Nelson is a very pleasant 67 y.o. female here for screening colonoscopy. Denies any nausea, vomiting, abdominal pain, melena or bright red blood per rectum  The risks and benefits as well as alternatives of endoscopic procedure(s) have been discussed and reviewed. All questions answered. The patient agrees to proceed.    Past Medical History:  Diagnosis Date   ANGIOEDEMA 12/16/2009   Asthma    Carpal tunnel syndrome 06/23/2011   Patient notes history of bilateral carpal tunnel syndrome.  Now has symptoms of right hand carpal tunnel.    COPD (chronic obstructive pulmonary disease) (HCC)    secondary to tobacco use // No PFTs on file   COVID-19    Dyspepsia 08/24/2017   Emphysema of lung (HCC)    not on O2 at home (09/08/2022)   ETOH abuse    Pt stopped in 3/08   Gastrointestinal hemorrhage    secondary to PUD on March 2008   GERD (gastroesophageal reflux disease)    on meds   OSA on CPAP    noncompliant with CPAP (2/2 it being depressing)   Peptic ulcer disease    +H. pylori (antigen)- Dr. Juanda Chance- treated   STRESS INCONTINENCE 01/24/2007   Tobacco abuse    quit in 2010   Urinary incontinence    Urticaria    Urticaria 07/01/2015    Past Surgical History:  Procedure Laterality Date   BREAST EXCISIONAL BIOPSY Right 1994   MANDIBLE SURGERY     due to trauma   TUBAL LIGATION      Prior to Admission medications   Medication Sig Start Date End Date Taking? Authorizing Provider  albuterol (VENTOLIN HFA) 108 (90 Base) MCG/ACT inhaler Inhale 2 puffs into the lungs every 6 (six) hours as needed for wheezing or shortness of breath. 11/02/21  Yes [name], MD  pantoprazole (PROTONIX) 40 MG tablet Take 1 tablet by mouth once  daily 06/23/22  Yes Dickie La, MD  albuterol (PROVENTIL) (2.5 MG/3ML) 0.083% nebulizer solution Use 1 vial in nebulizer up to 3 times a day when albuterol inhaler not working. If no better or getting worse, call 911 or physician line. 07/05/22   Dickie La, MD  Budeson-Glycopyrrol-Formoterol (BREZTRI AEROSPHERE) 160-9-4.8 MCG/ACT AERO Inhale 2 puffs into the lungs 2 (two) times daily. 05/03/22   Hetty Blend, FNP  clotrimazole (LOTRIMIN) 1 % cream Apply to right foot and between right toes twice daily for two weeks. 07/06/22   Dickie La, MD  EPINEPHrine (EPIPEN 2-PAK) 0.3 mg/0.3 mL IJ SOAJ injection Inject 0.3 mg into the muscle as needed (for allergic reaction). 11/02/21   [name], MD  Vitamin D, Cholecalciferol, 25 MCG (1000 UT) CAPS Take 1 capsule by mouth daily. 07/08/22   Dickie La, MD  Geoffry Paradise 150 MG/ML prefilled syringe Inject 150 mg into the skin every 28 (twenty-eight) days. 09/02/22   [provider]    Current Outpatient Medications  Medication Sig Dispense Refill   albuterol (VENTOLIN HFA) 108 (90 Base) MCG/ACT inhaler Inhale 2 puffs into the lungs every 6 (six) hours as needed for wheezing or shortness of breath. 18 g 1   pantoprazole (PROTONIX) 40 MG tablet Take 1 tablet by mouth once daily 90 tablet 0   albuterol (PROVENTIL) (  2.5 MG/3ML) 0.083% nebulizer solution Use 1 vial in nebulizer up to 3 times a day when albuterol inhaler not working. If no better or getting worse, call 911 or physician line. 75 mL 3   Budeson-Glycopyrrol-Formoterol (BREZTRI AEROSPHERE) 160-9-4.8 MCG/ACT AERO Inhale 2 puffs into the lungs 2 (two) times daily. 11 g 5   clotrimazole (LOTRIMIN) 1 % cream Apply to right foot and between right toes twice daily for two weeks. 60 g 0   EPINEPHrine (EPIPEN 2-PAK) 0.3 mg/0.3 mL IJ SOAJ injection Inject 0.3 mg into the muscle as needed (for allergic reaction). 1 each 1   Vitamin D, Cholecalciferol, 25 MCG (1000 UT) CAPS Take 1 capsule by mouth daily. 90 capsule  3   XOLAIR 150 MG/ML prefilled syringe Inject 150 mg into the skin every 28 (twenty-eight) days.     Current Facility-Administered Medications  Medication Dose Route Frequency Provider Last Rate Last Admin   0.9 %  sodium chloride infusion  500 mL Intravenous Once Vela Render, Eleonore Chiquito, MD       omalizumab Geoffry Paradise) prefilled syringe 300 mg  300 mg Subcutaneous Q28 days Hetty Blend, FNP   300 mg at 09/15/22 1132    Allergies as of 09/29/2022 - Review Complete 09/29/2022  Allergen Reaction Noted   Bee venom Anaphylaxis 06/30/2012   Aspirin Nausea Only    Other Rash 05/10/2018    Family History  Problem Relation Age of Onset   Diabetes Mother    Coronary artery disease Mother 42       requiring 5 vessel CABG   Diabetes Sister    Breast cancer Sister 12       died 2/2 breast ca age 70-60   Colon polyps Neg Hx    Colon cancer Neg Hx    Esophageal cancer Neg Hx    Rectal cancer Neg Hx    Stomach cancer Neg Hx     Social History   Socioeconomic History   Marital status: Single    Spouse name: Not on file   Number of children: 3   Years of education: 12th grade   Highest education level: Not on file  Occupational History   Occupation: Disability    Comment: previously worked in [employer] had to stop 2/2 occupational exposures leading to exacerbations of asthma   Tobacco Use   Smoking status: Former    Packs/day: 0.50    Years: 40.00    Additional pack years: 0.00    Total pack years: 20.00    Types: Cigarettes    Quit date: 12/04/2010    Years since quitting: 11.8   Smokeless tobacco: Never  Vaping Use   Vaping Use: Never used  Substance and Sexual Activity   Alcohol use: Not Currently   Drug use: No   Sexual activity: Not Currently  Other Topics Concern   Not on file  Social History Narrative   Pt is not working now, was previously a Conservation officer, nature at [employer].      Pt lives with her son      Pt has received financial assistance approval for 100%  discount at [facility] and has [facility] card.    Social Determinants of Health   Financial Resource Strain: Low Risk  (09/22/2022)   Overall Financial Resource Strain (CARDIA)    Difficulty of Paying Living Expenses: Not hard at all  Food Insecurity: No Food Insecurity (09/22/2022)   Hunger Vital Sign    Worried About Running Out of Food  in the Last Year: Never true    Ran Out of Food in the Last Year: Never true  Transportation Needs: No Transportation Needs (09/22/2022)   PRAPARE - Administrator, Civil Service (Medical): No    Lack of Transportation (Non-Medical): No  Physical Activity: Sufficiently Active (09/22/2022)   Exercise Vital Sign    Days of Exercise per Week: 3 days    Minutes of Exercise per Session: 60 min  Stress: No Stress Concern Present (09/22/2022)   [employer] of Occupational Health - Occupational Stress Questionnaire    Feeling of Stress : Not at all  Social Connections: Moderately Isolated (09/22/2022)   Social Connection and Isolation Panel [NHANES]    Frequency of Communication with Friends and Family: More than three times a week    Frequency of Social Gatherings with Friends and Family: Twice a week    Attends Religious Services: 1 to 4 times per year    Active Member of [employer] or Organizations: No    Attends Banker Meetings: Never    Marital Status: Divorced  Catering manager Violence: Not At Risk (09/22/2022)   Humiliation, Afraid, Rape, and Kick questionnaire    Fear of Current or Ex-Partner: No    Emotionally Abused: No    Physically Abused: No    Sexually Abused: No    Review of Systems:  All other review of systems negative except as mentioned in the HPI.  Physical Exam: Vital signs in last 24 hours: Blood Pressure (Abnormal) 145/84   Pulse 62   Temperature (Abnormal) 97.3 F (36.3 C)   Height 5' (1.524 m)   Weight 252 lb (114.3 kg)   Last Menstrual Period 04/19/2004 (Approximate)   Oxygen Saturation 94%   Body Mass Index  49.22 kg/m  General:   Alert, NAD Lungs:  Clear .   Heart:  Regular rate and rhythm Abdomen:  Soft, nontender and nondistended. Neuro/Psych:  Alert and cooperative. Normal mood and affect. A and O x 3  Reviewed labs, radiology imaging, old records and pertinent past GI work up  Patient is appropriate for planned procedure(s) and anesthesia in an ambulatory setting   K. Scherry Ran , MD [phone]

## 2022-09-29 NOTE — Patient Instructions (Signed)
Please read handouts provided. Continue present medications. Await pathology results.   YOU HAD AN ENDOSCOPIC PROCEDURE TODAY AT THE [facility]:   Refer to the procedure report that was given to you for any specific questions about what was found during the examination.  If the procedure report does not answer your questions, please call your gastroenterologist to clarify.  If you requested that your care partner not be given the details of your procedure findings, then the procedure report has been included in a sealed envelope for you to review at your convenience later.  YOU SHOULD EXPECT: Some feelings of bloating in the abdomen. Passage of more gas than usual.  Walking can help get rid of the air that was put into your GI tract during the procedure and reduce the bloating. If you had a lower endoscopy (such as a colonoscopy or flexible sigmoidoscopy) you may notice spotting of blood in your stool or on the toilet paper. If you underwent a bowel prep for your procedure, you may not have a normal bowel movement for a few days.  Please Note:  You might notice some irritation and congestion in your nose or some drainage.  This is from the oxygen used during your procedure.  There is no need for concern and it should clear up in a day or so.  SYMPTOMS TO REPORT IMMEDIATELY:  Following lower endoscopy (colonoscopy or flexible sigmoidoscopy):  Excessive amounts of blood in the stool  Significant tenderness or worsening of abdominal pains  Swelling of the abdomen that is new, acute  Fever of 100F or higher  For urgent or emergent issues, a gastroenterologist can be reached at any hour by calling [phone]. Do not use MyChart messaging for urgent concerns.    DIET:  We do recommend a small meal at first, but then you may proceed to your regular diet.  Drink plenty of fluids but you should avoid alcoholic beverages for 24 hours.  ACTIVITY:  You should plan to take it easy for  the rest of today and you should NOT DRIVE or use heavy machinery until tomorrow (because of the sedation medicines used during the test).    FOLLOW UP: Our staff will call the number listed on your records the next business day following your procedure.  We will call around 7:15- 8:00 am to check on you and address any questions or concerns that you may have regarding the information given to you following your procedure. If we do not reach you, we will leave a message.     If any biopsies were taken you will be contacted by phone or by letter within the next 1-3 weeks.  Please call us at [phone] if you have not heard about the biopsies in 3 weeks.    SIGNATURES/CONFIDENTIALITY: You and/or your care partner have signed paperwork which will be entered into your electronic medical record.  These signatures attest to the fact that that the information above on your After Visit Summary has been reviewed and is understood.  Full responsibility of the confidentiality of this discharge information lies with you and/or your care-partner. 

## 2022-09-30 ENCOUNTER — Telehealth: Payer: Self-pay | Admitting: *Deleted

## 2022-09-30 ENCOUNTER — Telehealth: Payer: Self-pay | Admitting: Allergy & Immunology

## 2022-09-30 NOTE — Telephone Encounter (Signed)
  Follow up Call-     09/29/2022   11:19 AM  Call back number  Post procedure Call Back phone  # [phone]  Permission to leave phone message Yes     Patient questions:  Do you have a fever, pain , or abdominal swelling? No. Pain Score  0 *  Have you tolerated food without any problems? Yes.    Have you been able to return to your normal activities? Yes.    Do you have any questions about your discharge instructions: Diet   No. Medications  No. Follow up visit  No.  Do you have questions or concerns about your Care? No.  Actions: * If pain score is 4 or above: No action needed, pain <4.

## 2022-09-30 NOTE — Telephone Encounter (Signed)
[facility] called to confirm delivery of her Xolair. It is going to be delivered on June 20th. Confirmed address with [facility].   [name], MD [facility]

## 2022-10-04 ENCOUNTER — Ambulatory Visit (INDEPENDENT_AMBULATORY_CARE_PROVIDER_SITE_OTHER): Payer: 59 | Admitting: Internal Medicine

## 2022-10-04 ENCOUNTER — Other Ambulatory Visit: Payer: Self-pay

## 2022-10-04 ENCOUNTER — Encounter: Payer: Self-pay | Admitting: Internal Medicine

## 2022-10-04 VITALS — BP 111/69 | HR 58 | Temp 97.8°F | Ht 60.0 in | Wt 256.2 lb

## 2022-10-04 DIAGNOSIS — D126 Benign neoplasm of colon, unspecified: Secondary | ICD-10-CM | POA: Diagnosis not present

## 2022-10-04 DIAGNOSIS — R911 Solitary pulmonary nodule: Secondary | ICD-10-CM

## 2022-10-04 DIAGNOSIS — Z1231 Encounter for screening mammogram for malignant neoplasm of breast: Secondary | ICD-10-CM

## 2022-10-04 DIAGNOSIS — N1831 Chronic kidney disease, stage 3a: Secondary | ICD-10-CM

## 2022-10-04 DIAGNOSIS — R748 Abnormal levels of other serum enzymes: Secondary | ICD-10-CM | POA: Insufficient documentation

## 2022-10-04 DIAGNOSIS — G4733 Obstructive sleep apnea (adult) (pediatric): Secondary | ICD-10-CM | POA: Diagnosis not present

## 2022-10-04 DIAGNOSIS — K219 Gastro-esophageal reflux disease without esophagitis: Secondary | ICD-10-CM

## 2022-10-04 DIAGNOSIS — R030 Elevated blood-pressure reading, without diagnosis of hypertension: Secondary | ICD-10-CM | POA: Diagnosis not present

## 2022-10-04 DIAGNOSIS — E785 Hyperlipidemia, unspecified: Secondary | ICD-10-CM | POA: Diagnosis not present

## 2022-10-04 DIAGNOSIS — J4489 Other specified chronic obstructive pulmonary disease: Secondary | ICD-10-CM

## 2022-10-04 DIAGNOSIS — L508 Other urticaria: Secondary | ICD-10-CM

## 2022-10-04 DIAGNOSIS — Z6841 Body Mass Index (BMI) 40.0 and over, adult: Secondary | ICD-10-CM

## 2022-10-04 DIAGNOSIS — I272 Pulmonary hypertension, unspecified: Secondary | ICD-10-CM | POA: Insufficient documentation

## 2022-10-04 NOTE — Assessment & Plan Note (Signed)
History of GERD and PUD. Continue PPI.

## 2022-10-04 NOTE — Assessment & Plan Note (Addendum)
ASCVD risk 6.8% with last lipid panel 02/2022. Continue lifestyle modification.

## 2022-10-04 NOTE — Assessment & Plan Note (Signed)
Maintaining about 10 pound weight loss over the last year. She sounds more motivated to exercise today, working up to walking 2 miles. Currently about 1 mile 2x/week. We discussed increased frequency even if not as far each time, limited now by weather. Working on healthy dietary changes.

## 2022-10-04 NOTE — Assessment & Plan Note (Signed)
Follows with allergy-immunology. Remains on Xolair injections every 28 days.

## 2022-10-04 NOTE — Assessment & Plan Note (Signed)
Chronic and stable. Patient has been walking more, able to go about one mile using albuterol before walking. This seems like great improvement since last visit and I have congratulated her! Follows with Dr. Celine Mans, last visit 08/25/22.   Plan -Continue Breztri bid, albuterol prn -F/u with pulmonology, allergy-immunology

## 2022-10-04 NOTE — Assessment & Plan Note (Signed)
Very mild. Noted on CMP 06/2022. Repeat at next visit.

## 2022-10-04 NOTE — Assessment & Plan Note (Signed)
Mammogram scheduled 12/2022 per patient report. F/u at next visit.

## 2022-10-04 NOTE — Progress Notes (Signed)
Established Patient Office Visit  Subjective   Patient ID: Maria Nelson, female    DOB: November 17, 1955  Age: 67 y.o. MRN: [id number]  Chief Complaint  Patient presents with   Follow-up    [name] returns to clinic today for follow-up of chronic medical conditions. Please see assessment/plan in problem-based charting for further details of today's visit.     Patient Active Problem List   Diagnosis Date Noted   Pulmonary hypertension, unspecified (HCC) 10/04/2022   Alkaline phosphatase raised 10/04/2022   Aortic atherosclerosis (HCC) 07/06/2022   Encounter for screening mammogram for malignant neoplasm of breast 07/06/2022   Tinea pedis of right foot 07/06/2022   Tubular adenoma of colon 07/06/2022   Stage 3a chronic kidney disease (HCC) 03/15/2022   Pulmonary nodule less than 6 mm determined by computed tomography of lung 12/03/2021   Benign paroxysmal positional vertigo 09/03/2021   Healthcare maintenance 01/28/2021   Hyperlipidemia 11/22/2019   Paroxysmal atrial fibrillation (HCC) 08/24/2017   Chronic pain of both knees 08/16/2016   Back pain 06/23/2016   Seasonal and perennial allergic rhinitis 03/04/2015   Asthma with COPD 06/19/2014   HNP (herniated nucleus pulposus), lumbar 04/16/2013   Tenosynovitis, de Quervain 11/15/2012   Morbid obesity with BMI of 45.0-49.9, adult (HCC) 09/08/2012   Carpal tunnel syndrome 06/23/2011   Shoulder pain 09/02/2010   Sleep apnea 09/02/2010   GERD 12/24/2009   Chronic urticaria 12/16/2009   STRESS INCONTINENCE 01/24/2007   History of peptic ulcer disease 07/26/2006       Objective:     BP 111/69 (BP Location: Left Arm, Patient Position: Sitting, Cuff Size: Large)   Pulse (!) 58   Temp 97.8 F (36.6 C) (Oral)   Ht 5' (1.524 m)   Wt 256 lb 3.2 oz (116.2 kg)   LMP 04/19/2004 (Approximate)   SpO2 96% Comment: RA  BMI 50.04 kg/m  BP Readings from Last 3 Encounters:  10/04/22 111/69  09/29/22 105/84   09/04/22 122/82   Wt Readings from Last 3 Encounters:  10/04/22 256 lb 3.2 oz (116.2 kg)  09/29/22 252 lb (114.3 kg)  09/22/22 255 lb (115.7 kg)      Physical Exam Vitals reviewed.  Constitutional:      General: She is not in acute distress.    Appearance: Normal appearance. She is not ill-appearing.  Pulmonary:     Effort: Pulmonary effort is normal.  Neurological:     General: No focal deficit present.     Mental Status: She is alert.  Psychiatric:        Mood and Affect: Mood normal.        Behavior: Behavior normal.        Thought Content: Thought content normal.        Judgment: Judgment normal.       Assessment & Plan:   Problem List Items Addressed This Visit       Respiratory   Pulmonary nodule less than 6 mm determined by computed tomography of lung (Chronic)    CT chest completed 08/2022 for f/u of right lung nodule seen in 2023. Complete resolution noted, consistent with inflammatory/infectious etiology. We discussed annual lung cancer screening given smoking history, decision to be made at future visit.      Sleep apnea    F/u with Dr. Mayford Knife, cardiology/sleep medicine, August 2024.      Asthma with COPD    Chronic and stable. Patient has been walking more, able to go about one  mile using albuterol before walking. This seems like great improvement since last visit and I have congratulated her! Follows with Dr. Celine Mans, last visit 08/25/22.   Plan -Continue Breztri bid, albuterol prn -F/u with pulmonology, allergy-immunology        Digestive   GERD    History of GERD and PUD. Continue PPI.      Tubular adenoma of colon    Referred for colonoscopy at last visit, completed 09/2022. Evidence of tubular adenoma s/p resection. Repeat CSY in 3-5 years.        Musculoskeletal and Integument   Chronic urticaria    Follows with allergy-immunology. Remains on Xolair injections every 28 days.        Genitourinary   Stage 3a chronic kidney disease (HCC)     History of CKD3a without proteinuria. Last checked 06/2022, eGFR at that time actually >60. Recheck at next visit.        Other   Morbid obesity with BMI of 45.0-49.9, adult (HCC) - Primary (Chronic)    Maintaining about 10 pound weight loss over the last year. She sounds more motivated to exercise today, working up to walking 2 miles. Currently about 1 mile 2x/week. We discussed increased frequency even if not as far each time, limited now by weather. Working on healthy dietary changes.      RESOLVED: Blood pressure elevated without history of HTN    Blood pressure not consistent with hypertension at recent office visits. Will resolve.      Hyperlipidemia    ASCVD risk 6.8% with last lipid panel 02/2022. Continue lifestyle modification.       Encounter for screening mammogram for malignant neoplasm of breast    Mammogram scheduled 12/2022 per patient report. F/u at next visit.      Alkaline phosphatase raised    Very mild. Noted on CMP 06/2022. Repeat at next visit.       Return in about 6 months (around 04/05/2023).    Dickie La, MD

## 2022-10-04 NOTE — Assessment & Plan Note (Signed)
Referred for colonoscopy at last visit, completed 09/2022. Evidence of tubular adenoma s/p resection. Repeat CSY in 3-5 years.

## 2022-10-04 NOTE — Progress Notes (Signed)
I reviewed the RN's note and I agree with her assessment and plan.

## 2022-10-04 NOTE — Assessment & Plan Note (Signed)
Blood pressure not consistent with hypertension at recent office visits. Will resolve.

## 2022-10-04 NOTE — Patient Instructions (Addendum)
It was wonderful to see you today!  Please start taking vitamin D 25 mcg (1000 units) daily.   Please contact your pharmacy to schedule a shingles (Shingrix) vaccine.

## 2022-10-04 NOTE — Assessment & Plan Note (Signed)
F/u with Dr. Mayford Knife, cardiology/sleep medicine, August 2024.

## 2022-10-04 NOTE — Assessment & Plan Note (Signed)
History of CKD3a without proteinuria. Last checked 06/2022, eGFR at that time actually >60. Recheck at next visit.

## 2022-10-04 NOTE — Assessment & Plan Note (Signed)
CT chest completed 08/2022 for f/u of right lung nodule seen in 2023. Complete resolution noted, consistent with inflammatory/infectious etiology. We discussed annual lung cancer screening given smoking history, decision to be made at future visit.

## 2022-10-07 ENCOUNTER — Encounter: Payer: Self-pay | Admitting: Gastroenterology

## 2022-10-13 ENCOUNTER — Encounter: Payer: Self-pay | Admitting: Cardiology

## 2022-10-13 ENCOUNTER — Ambulatory Visit: Payer: 59

## 2022-10-13 ENCOUNTER — Ambulatory Visit: Payer: 59 | Attending: Cardiology | Admitting: Cardiology

## 2022-10-13 VITALS — BP 118/76 | HR 62 | Ht 60.0 in | Wt 253.0 lb

## 2022-10-13 DIAGNOSIS — Z79899 Other long term (current) drug therapy: Secondary | ICD-10-CM

## 2022-10-13 DIAGNOSIS — R0602 Shortness of breath: Secondary | ICD-10-CM | POA: Diagnosis not present

## 2022-10-13 DIAGNOSIS — I2583 Coronary atherosclerosis due to lipid rich plaque: Secondary | ICD-10-CM | POA: Diagnosis not present

## 2022-10-13 DIAGNOSIS — I480 Paroxysmal atrial fibrillation: Secondary | ICD-10-CM | POA: Diagnosis not present

## 2022-10-13 DIAGNOSIS — G4733 Obstructive sleep apnea (adult) (pediatric): Secondary | ICD-10-CM | POA: Diagnosis not present

## 2022-10-13 DIAGNOSIS — E7800 Pure hypercholesterolemia, unspecified: Secondary | ICD-10-CM

## 2022-10-13 DIAGNOSIS — I2510 Atherosclerotic heart disease of native coronary artery without angina pectoris: Secondary | ICD-10-CM

## 2022-10-13 DIAGNOSIS — Z6841 Body Mass Index (BMI) 40.0 and over, adult: Secondary | ICD-10-CM

## 2022-10-13 MED ORDER — ATORVASTATIN CALCIUM 20 MG PO TABS
20.0000 mg | ORAL_TABLET | Freq: Every day | ORAL | 3 refills | Status: DC
Start: 2022-10-13 — End: 2023-05-03

## 2022-10-13 NOTE — Progress Notes (Signed)
Date:  10/13/2022   ID:  [name], [name] 1955-11-27, MRN [id number]  PCP:  Dickie La, MD  Cardiologist: Armanda Magic, MD Electrophysiologist:  None   Chief Complaint:  OSA and atrial fibrillation  History of Present Illness:    Maria Nelson is a 67 y.o. female who presents via audio/video conferencing for a telehealth visit today.    Maria Nelson is a 67 y.o. female with a hx of severe obstructive sleep apnea with an AHI of 39/h and no significant central sleep apnea.  She had moderate oxygen desaturations on sleep study as low as 76% with moderate snoring.  She was noted to have nocturnal hypoxemia as well.  She was titrated to CPAP therapy at 15 cm H2O.  She also has a history of PAF and has not been on anticoagulation due to [facility] score of 1.  When I saw her a year ago she was complaining of SOB. A nuclear stress test showed no ischemia but possible prior infarct. Coronary CTA 2020 showed a coronary calcium score of 19.5 with minimal atherosclerosis of the distal left main 0 to 25% and a rudimentary RCA that  filled retrograde from a large OM branch with left dominant system  She has not had problems tolerating the PAP mask ever since  COVID 19 started.  When I saw her last she said her mask was working better and she was using it more but the problem is she does not sleep well at night and mainly sleep during the day and she will use the mask during the day when she sleeps.    She is now here for follow-up.She is here today for followup and is doing well.  She denies any chest pain or pressure, SOB, DOE (except when out in the heat), PND, orthopnea, dizziness, palpitations or syncope. She has occasionally LE edema that goes away with elevation.  She is compliant with her meds and is tolerating meds with no SE.    Prior CV studies:   The following studies were reviewed today:  None  Past Medical History:  Diagnosis Date   Adnexal pain  09/06/2018   ANGIOEDEMA 12/16/2009   Asthma    Blood pressure elevated without history of HTN 03/05/2014   Carpal tunnel syndrome 06/23/2011   Patient notes history of bilateral carpal tunnel syndrome.  Now has symptoms of right hand carpal tunnel.    COPD (chronic obstructive pulmonary disease) (HCC)    secondary to tobacco use // No PFTs on file   COVID-19    Dyspepsia 08/24/2017   Emphysema of lung (HCC)    not on O2 at home (09/08/2022)   ETOH abuse    Pt stopped in 3/08   Gastrointestinal hemorrhage    secondary to PUD on March 2008   GERD (gastroesophageal reflux disease)    on meds   Low iron 12/03/2021   OSA on CPAP    noncompliant with CPAP (2/2 it being depressing)   Other fatigue 07/06/2022   Peptic ulcer disease    +H. pylori (antigen)- Dr. Juanda Chance- treated   STRESS INCONTINENCE 01/24/2007   Tobacco abuse    quit in 2010   Urinary incontinence    Urticaria    Urticaria 07/01/2015   Past Surgical History:  Procedure Laterality Date   BREAST EXCISIONAL BIOPSY Right 1994   MANDIBLE SURGERY     due to trauma   TUBAL LIGATION       Current Meds  Medication Sig  albuterol (PROVENTIL) (2.5 MG/3ML) 0.083% nebulizer solution Use 1 vial in nebulizer up to 3 times a day when albuterol inhaler not working. If no better or getting worse, call 911 or physician line.   albuterol (VENTOLIN HFA) 108 (90 Base) MCG/ACT inhaler Inhale 2 puffs into the lungs every 6 (six) hours as needed for wheezing or shortness of breath.   Budeson-Glycopyrrol-Formoterol (BREZTRI AEROSPHERE) 160-9-4.8 MCG/ACT AERO Inhale 2 puffs into the lungs 2 (two) times daily.   clotrimazole (LOTRIMIN) 1 % cream Apply to right foot and between right toes twice daily for two weeks.   EPINEPHrine (EPIPEN 2-PAK) 0.3 mg/0.3 mL IJ SOAJ injection Inject 0.3 mg into the muscle as needed (for allergic reaction).   pantoprazole (PROTONIX) 40 MG tablet Take 1 tablet by mouth once daily   Vitamin D, Cholecalciferol,  25 MCG (1000 UT) CAPS Take 1 capsule by mouth daily.   XOLAIR 150 MG/ML prefilled syringe Inject 150 mg into the skin every 28 (twenty-eight) days.   Current Facility-Administered Medications for the 10/13/22 encounter (Office Visit) with Quintella Reichert, MD  Medication   omalizumab Geoffry Paradise) prefilled syringe 300 mg     Allergies:   Bee venom, Aspirin, and Other   Social History   Tobacco Use   Smoking status: Former    Packs/day: 0.50    Years: 40.00    Additional pack years: 0.00    Total pack years: 20.00    Types: Cigarettes    Quit date: 12/04/2010    Years since quitting: 11.8   Smokeless tobacco: Never  Vaping Use   Vaping Use: Never used  Substance Use Topics   Alcohol use: Not Currently   Drug use: No     Family Hx: The patient's family history includes Breast cancer (age of onset: 62) in her sister; Coronary artery disease (age of onset: 59) in her mother; Diabetes in her mother and sister. There is no history of Colon polyps, Colon cancer, Esophageal cancer, Rectal cancer, or Stomach cancer.  ROS:   Please see the history of present illness.     All other systems reviewed and are negative.   Labs/Other Tests and Data Reviewed:    Recent Labs: 07/05/2022: ALT 11; BUN 11; Creatinine, Ser 0.96; Hemoglobin 14.3; Platelets 266; Potassium 4.3; Sodium 141   Recent Lipid Panel Lab Results  Component Value Date/Time   CHOL 200 (H) 03/15/2022 12:08 PM   TRIG 82 03/15/2022 12:08 PM   HDL 60 03/15/2022 12:08 PM   CHOLHDL 3.3 03/15/2022 12:08 PM   CHOLHDL 2.3 09/09/2012 05:40 AM   LDLCALC 125 (H) 03/15/2022 12:08 PM    Wt Readings from Last 3 Encounters:  10/13/22 253 lb (114.8 kg)  10/04/22 256 lb 3.2 oz (116.2 kg)  09/29/22 252 lb (114.3 kg)     Objective:    Vital Signs:  BP 118/76   Pulse 62   Ht 5' (1.524 m)   Wt 253 lb (114.8 kg)   LMP 04/19/2004 (Approximate)   SpO2 96%   BMI 49.41 kg/m   GEN: Well nourished, well developed in no acute  distress HEENT: Normal NECK: No JVD; No carotid bruits LYMPHATICS: No lymphadenopathy CARDIAC:RRR, no murmurs, rubs, gallops RESPIRATORY:  Clear to auscultation without rales, wheezing or rhonchi  ABDOMEN: Soft, non-tender, non-distended MUSCULOSKELETAL:  No edema; No deformity  SKIN: Warm and dry NEUROLOGIC:  Alert and oriented x 3 PSYCHIATRIC:  Normal affect   ASSESSMENT & PLAN:    1.  OSA -she is  noncompliant with her device and has stopped using it -she is not a candidate for the Inspire device due to morbid obesity  2.  Morbid obesity  -BMI is 49 -Again I encouraged her to get into a routine exercise program and follow a Mediterranean diet  3.  PAF  -She is maintaining normal sinus rhythm on exam today and denies any palpitations -She is not anticoagulated due to a CHADS2VASC score of 1-2 (female and age >56).  4.  SOB  -this has been an ongoing problem likely related to morbid obesity, asthma and deconditioning but has improved recently -Coronary CTA 2020 showed a coronary calcium score of 19.5 with minimal atherosclerosis of the distal left main 0 to 25% and a rudimentary RCA that  filled retrograde from a large OM branch with left dominant system  5.  CAD -Coronary CTA 2020 showed a coronary calcium score of 19.5 with minimal atherosclerosis of the distal left main 0 to 25% and a rudimentary RCA that  filled retrograde from a large OM branch with left dominant system -she denies any anginal sx -not on ASA due to nausea -start Atorvastatin 20mg  daily  6.  HLD -LDL goal < 70 -start Atorvastatin 20mg  daily -repeat FLP and ALT in 6 weeks  Medication Adjustments/Labs and Tests Ordered: Current medicines are reviewed at length with the patient today.  Concerns regarding medicines are outlined above.  Tests Ordered: No orders of the defined types were placed in this encounter.  Medication Changes: No orders of the defined types were placed in this  encounter.   Disposition:  Follow up in 1 year(s)  Signed, Armanda Magic, MD  10/13/2022 2:36 PM    [facility]

## 2022-10-13 NOTE — Patient Instructions (Signed)
Medication Instructions:  Please START taking atorvastatin 20 mg every evening.  *If you need a refill on your cardiac medications before your next appointment, please call your pharmacy*   Lab Work: Please schedule a FASTING lipid panel and ALT to be drawn in our lab in 6 weeks.  If you have labs (blood work) drawn today and your tests are completely normal, you will receive your results only by: MyChart Message (if you have MyChart) OR A paper copy in the mail If you have any lab test that is abnormal or we need to change your treatment, we will call you to review the results.   Testing/Procedures: None.   Follow-Up:  We recommend signing up for the patient portal called "MyChart".  Sign up information is provided on this After Visit Summary.  MyChart is used to connect with patients for Virtual Visits (Telemedicine).  Patients are able to view lab/test results, encounter notes, upcoming appointments, etc.  Non-urgent messages can be sent to your provider as well.   To learn more about what you can do with MyChart, go to [email or web address].    Your next appointment:   1 year(s)  Provider:   Dr. Armanda Magic, MD

## 2022-10-13 NOTE — Addendum Note (Signed)
Addended by: Luellen Pucker on: 10/13/2022 02:43 PM   Modules accepted: Orders

## 2022-10-15 ENCOUNTER — Ambulatory Visit (INDEPENDENT_AMBULATORY_CARE_PROVIDER_SITE_OTHER): Payer: 59

## 2022-10-15 DIAGNOSIS — L501 Idiopathic urticaria: Secondary | ICD-10-CM | POA: Diagnosis not present

## 2022-10-18 ENCOUNTER — Other Ambulatory Visit: Payer: Self-pay | Admitting: Internal Medicine

## 2022-10-31 DIAGNOSIS — H5213 Myopia, bilateral: Secondary | ICD-10-CM | POA: Diagnosis not present

## 2022-11-12 ENCOUNTER — Ambulatory Visit (INDEPENDENT_AMBULATORY_CARE_PROVIDER_SITE_OTHER): Payer: 59 | Admitting: *Deleted

## 2022-11-12 DIAGNOSIS — L501 Idiopathic urticaria: Secondary | ICD-10-CM

## 2022-11-24 ENCOUNTER — Ambulatory Visit: Payer: 59 | Attending: Cardiology

## 2022-11-24 DIAGNOSIS — Z79899 Other long term (current) drug therapy: Secondary | ICD-10-CM | POA: Diagnosis not present

## 2022-11-24 DIAGNOSIS — E7800 Pure hypercholesterolemia, unspecified: Secondary | ICD-10-CM

## 2022-11-25 ENCOUNTER — Telehealth: Payer: Self-pay

## 2022-11-25 NOTE — Telephone Encounter (Signed)
Called patient to discuss results, no answer. Left detailed voice mail letting patient know that lipids are at goal and to continue current therapy. Also advised I would forward to PCP.

## 2022-11-25 NOTE — Telephone Encounter (Signed)
-----   Message from Nurse Kinnie Feil C sent at 11/25/2022 10:08 AM EDT -----  ----- Message ----- From: Quintella Reichert, MD Sent: 11/25/2022   8:17 AM EDT To: Frutoso Schatz, RN; Dickie La, MD  Lipids at goal continue current therapy and forward to PCP

## 2022-12-08 ENCOUNTER — Ambulatory Visit: Payer: 59

## 2022-12-10 ENCOUNTER — Ambulatory Visit
Admission: EM | Admit: 2022-12-10 | Discharge: 2022-12-10 | Disposition: A | Discharge status: Home / Self Care | Payer: 59 | Source: Home / Self Care

## 2022-12-10 DIAGNOSIS — Z20822 Contact with and (suspected) exposure to covid-19: Secondary | ICD-10-CM | POA: Insufficient documentation

## 2022-12-10 LAB — SARS CORONAVIRUS 2 (TAT 6-24 HRS): SARS Coronavirus 2: NEGATIVE

## 2022-12-10 NOTE — ED Triage Notes (Signed)
Pt was expose to covid and would like testing. Pt denies any symptoms.

## 2022-12-10 NOTE — Discharge Instructions (Signed)
COVID test is pending.  We will call if it is positive. 

## 2022-12-10 NOTE — ED Provider Notes (Signed)
[facility]    CSN: [id number] Arrival date & time: 12/10/22  1110      History   Chief Complaint Chief Complaint  Patient presents with   Letter for School/Work    HPI Maria Nelson is a 67 y.o. female.   Patient presents today for COVID testing.  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  She denies any symptoms.     Past Medical History:  Diagnosis Date   Adnexal pain 09/06/2018   ANGIOEDEMA 12/16/2009   Asthma    Blood pressure elevated without history of HTN 03/05/2014   Carpal tunnel syndrome 06/23/2011   Patient notes history of bilateral carpal tunnel syndrome.  Now has symptoms of right hand carpal tunnel.    COPD (chronic obstructive pulmonary disease) (HCC)    secondary to tobacco use // No PFTs on file   COVID-19    Dyspepsia 08/24/2017   Emphysema of lung (HCC)    not on O2 at home (09/08/2022)   ETOH abuse    Pt stopped in 3/08   Gastrointestinal hemorrhage    secondary to PUD on March 2008   GERD (gastroesophageal reflux disease)    on meds   Low iron 12/03/2021   OSA on CPAP    noncompliant with CPAP (2/2 it being depressing)   Other fatigue 07/06/2022   Peptic ulcer disease    +H. pylori (antigen)- Dr. Juanda Chance- treated   STRESS INCONTINENCE 01/24/2007   Tobacco abuse    quit in 2010   Urinary incontinence    Urticaria    Urticaria 07/01/2015    Patient Active Problem List   Diagnosis Date Noted   Pulmonary hypertension, unspecified (HCC) 10/04/2022   Alkaline phosphatase raised 10/04/2022   Aortic atherosclerosis (HCC) 07/06/2022   Encounter for screening mammogram for malignant neoplasm of breast 07/06/2022   Tinea pedis of right foot 07/06/2022   Tubular adenoma of colon 07/06/2022   Stage 3a chronic kidney disease (HCC) 03/15/2022   Pulmonary nodule less than 6 mm determined by computed tomography of lung 12/03/2021   Benign paroxysmal positional  vertigo 09/03/2021   Healthcare maintenance 01/28/2021   Hyperlipidemia 11/22/2019   Paroxysmal atrial fibrillation (HCC) 08/24/2017   Chronic pain of both knees 08/16/2016   Back pain 06/23/2016   Seasonal and perennial allergic rhinitis 03/04/2015   Asthma with COPD 06/19/2014   HNP (herniated nucleus pulposus), lumbar 04/16/2013   Tenosynovitis, de Quervain 11/15/2012   Morbid obesity with BMI of 45.0-49.9, adult (HCC) 09/08/2012   Carpal tunnel syndrome 06/23/2011   Shoulder pain 09/02/2010   Sleep apnea 09/02/2010   GERD 12/24/2009   Chronic urticaria 12/16/2009   STRESS INCONTINENCE 01/24/2007   History of peptic ulcer disease 07/26/2006    Past Surgical History:  Procedure Laterality Date   BREAST EXCISIONAL BIOPSY Right 1994   MANDIBLE SURGERY     due to trauma   TUBAL LIGATION      OB History   No obstetric history on file.      Home Medications    Prior to Admission medications   Medication Sig Start Date End Date Taking? Authorizing Provider  albuterol (PROVENTIL) (2.5 MG/3ML) 0.083% nebulizer solution Use 1 vial in nebulizer up to 3 times a day when albuterol inhaler not working. If no better or getting worse, call 911 or physician line. 07/05/22  Yes Dickie La, MD  albuterol (VENTOLIN HFA) 108 (90 Base) MCG/ACT  inhaler Inhale 2 puffs into the lungs every 6 (six) hours as needed for wheezing or shortness of breath. 11/02/21  Yes [name], MD  atorvastatin (LIPITOR) 20 MG tablet Take 1 tablet (20 mg total) by mouth daily. 10/13/22 01/11/23 Yes Turner, Cornelious Bryant, MD  Budeson-Glycopyrrol-Formoterol (BREZTRI AEROSPHERE) 160-9-4.8 MCG/ACT AERO Inhale 2 puffs into the lungs 2 (two) times daily. 05/03/22  Yes Ambs, Norvel Richards, FNP  clotrimazole (LOTRIMIN) 1 % cream Apply to right foot and between right toes twice daily for two weeks. 07/06/22  Yes Dickie La, MD  EPINEPHrine (EPIPEN 2-PAK) 0.3 mg/0.3 mL IJ SOAJ injection Inject 0.3 mg into the muscle as needed (for allergic  reaction). 11/02/21  Yes [name], MD  pantoprazole (PROTONIX) 40 MG tablet Take 1 tablet by mouth once daily 10/18/22  Yes Reymundo Poll, MD  Vitamin D, Cholecalciferol, 25 MCG (1000 UT) CAPS Take 1 capsule by mouth daily. 07/08/22  Yes Dickie La, MD  Geoffry Paradise 150 MG/ML prefilled syringe Inject 150 mg into the skin every 28 (twenty-eight) days. 09/02/22  Yes [provider]    Family History Family History  Problem Relation Age of Onset   Diabetes Mother    Coronary artery disease Mother 58       requiring 5 vessel CABG   Diabetes Sister    Breast cancer Sister 55       died 2/2 breast ca age 24-60   Colon polyps Neg Hx    Colon cancer Neg Hx    Esophageal cancer Neg Hx    Rectal cancer Neg Hx    Stomach cancer Neg Hx     Social History Social History   Tobacco Use   Smoking status: Former    Current packs/day: 0.00    Average packs/day: 0.5 packs/day for 40.0 years (20.0 ttl pk-yrs)    Types: Cigarettes    Start date: 12/04/1970    Quit date: 12/04/2010    Years since quitting: 12.0   Smokeless tobacco: Never  Vaping Use   Vaping status: Never Used  Substance Use Topics   Alcohol use: Not Currently   Drug use: No     Allergies   Bee venom, Aspirin, and Other   Review of Systems Review of Systems Per HPI  Physical Exam Triage Vital Signs ED Triage Vitals  Encounter Vitals Group     BP 12/10/22 1201 136/76     Systolic BP Percentile --      Diastolic BP Percentile --      Pulse Rate 12/10/22 1201 70     Resp 12/10/22 1201 16     Temp 12/10/22 1201 97.9 F (36.6 C)     Temp Source 12/10/22 1201 Oral     SpO2 12/10/22 1201 98 %     Weight --      Height --      Head Circumference --      Peak Flow --      Pain Score 12/10/22 1226 0     Pain Loc --      Pain Education --      Exclude from Growth Chart --    No data found.  Updated Vital Signs BP 136/76 (BP Location: Left Arm)   Pulse 70   Temp 97.9 F (36.6 C) (Oral)   Resp 16    LMP 04/19/2004 (Approximate)   SpO2 98%   Visual Acuity Right Eye Distance:   Left Eye Distance:   Bilateral Distance:  Right Eye Near:   Left Eye Near:    Bilateral Near:     Physical Exam Constitutional:      General: She is not in acute distress.    Appearance: Normal appearance. She is not toxic-appearing or diaphoretic.  HENT:     Head: Normocephalic and atraumatic.  Eyes:     Extraocular Movements: Extraocular movements intact.     Conjunctiva/sclera: Conjunctivae normal.  Pulmonary:     Effort: Pulmonary effort is normal.  Neurological:     General: No focal deficit present.     Mental Status: She is alert and oriented to person, place, and time. Mental status is at baseline.  Psychiatric:        Mood and Affect: Mood normal.        Behavior: Behavior normal.        Thought Content: Thought content normal.        Judgment: Judgment normal.      UC Treatments / Results  Labs (all labs ordered are listed, but only abnormal results are displayed) Labs Reviewed  SARS CORONAVIRUS 2 (TAT 6-24 HRS)    EKG   Radiology No results found.  Procedures Procedures (including critical care time)  Medications Ordered in UC Medications - No data to display  Initial Impression / Assessment and Plan / UC Course  I have reviewed the triage vital signs and the nursing notes.  Pertinent labs & imaging results that were available during my care of the patient were reviewed by me and considered in my medical decision making (see chart for details).     COVID test pending due to possible exposure.  Patient is asymptomatic. Final Clinical Impressions(s) / UC Diagnoses   Final diagnoses:  Close exposure to COVID-19 virus     Discharge Instructions      COVID test is pending.  We will call if it is positive.    ED Prescriptions   None    PDMP not reviewed this encounter.   Gustavus Bryant, [place] 12/10/22 [phone]

## 2022-12-12 ENCOUNTER — Other Ambulatory Visit: Payer: Self-pay | Admitting: Family Medicine

## 2022-12-13 ENCOUNTER — Ambulatory Visit (INDEPENDENT_AMBULATORY_CARE_PROVIDER_SITE_OTHER): Payer: 59

## 2022-12-13 DIAGNOSIS — L501 Idiopathic urticaria: Secondary | ICD-10-CM

## 2023-01-04 ENCOUNTER — Other Ambulatory Visit: Payer: Self-pay | Admitting: Internal Medicine

## 2023-01-04 DIAGNOSIS — Z1231 Encounter for screening mammogram for malignant neoplasm of breast: Secondary | ICD-10-CM

## 2023-01-04 DIAGNOSIS — G8929 Other chronic pain: Secondary | ICD-10-CM

## 2023-01-04 DIAGNOSIS — Z1211 Encounter for screening for malignant neoplasm of colon: Secondary | ICD-10-CM

## 2023-01-04 DIAGNOSIS — Z23 Encounter for immunization: Secondary | ICD-10-CM

## 2023-01-05 ENCOUNTER — Ambulatory Visit
Admission: RE | Admit: 2023-01-05 | Discharge: 2023-01-05 | Disposition: A | Discharge status: Home / Self Care | Payer: 59 | Source: Ambulatory Visit | Attending: Internal Medicine | Admitting: Internal Medicine

## 2023-01-05 DIAGNOSIS — Z1231 Encounter for screening mammogram for malignant neoplasm of breast: Secondary | ICD-10-CM | POA: Diagnosis not present

## 2023-01-06 ENCOUNTER — Ambulatory Visit
Admission: EM | Admit: 2023-01-06 | Discharge: 2023-01-06 | Disposition: A | Discharge status: Home / Self Care | Payer: 59 | Attending: Family Medicine | Admitting: Family Medicine

## 2023-01-06 ENCOUNTER — Ambulatory Visit: Payer: 59

## 2023-01-06 DIAGNOSIS — M7651 Patellar tendinitis, right knee: Secondary | ICD-10-CM | POA: Diagnosis not present

## 2023-01-06 DIAGNOSIS — M25461 Effusion, right knee: Secondary | ICD-10-CM | POA: Diagnosis not present

## 2023-01-06 DIAGNOSIS — M25561 Pain in right knee: Secondary | ICD-10-CM | POA: Diagnosis not present

## 2023-01-06 MED ORDER — KETOROLAC TROMETHAMINE 30 MG/ML IJ SOLN
30.0000 mg | Freq: Once | INTRAMUSCULAR | Status: AC
  Administered 2023-01-06: 30 mg via INTRAMUSCULAR

## 2023-01-06 NOTE — ED Provider Notes (Signed)
[facility]    CSN: [id number] Arrival date & time: 01/06/23  0931      History   Chief Complaint Chief Complaint  Patient presents with   Knee Pain    Right    HPI Analyah Nelson is a 67 y.o. female.    Knee Pain  She is here for right knee pain x 2 days.  Feels like the knee is "popping in and out".  No fall, no injury, nor surgery.  Pain at the medial area of the knee.  No other symptoms noted.  Unable to bear weight without pain.  She did try ice and otc creams without help.  She has had this before a long time ago.  10/10 in pain.       Past Medical History:  Diagnosis Date   Adnexal pain 09/06/2018   ANGIOEDEMA 12/16/2009   Asthma    Blood pressure elevated without history of HTN 03/05/2014   Carpal tunnel syndrome 06/23/2011   Patient notes history of bilateral carpal tunnel syndrome.  Now has symptoms of right hand carpal tunnel.    COPD (chronic obstructive pulmonary disease) (HCC)    secondary to tobacco use // No PFTs on file   COVID-19    Dyspepsia 08/24/2017   Emphysema of lung (HCC)    not on O2 at home (09/08/2022)   ETOH abuse    Pt stopped in 3/08   Gastrointestinal hemorrhage    secondary to PUD on March 2008   GERD (gastroesophageal reflux disease)    on meds   Low iron 12/03/2021   OSA on CPAP    noncompliant with CPAP (2/2 it being depressing)   Other fatigue 07/06/2022   Peptic ulcer disease    +H. pylori (antigen)- Dr. Juanda Chance- treated   STRESS INCONTINENCE 01/24/2007   Tobacco abuse    quit in 2010   Urinary incontinence    Urticaria    Urticaria 07/01/2015    Patient Active Problem List   Diagnosis Date Noted   Pulmonary hypertension, unspecified (HCC) 10/04/2022   Alkaline phosphatase raised 10/04/2022   Aortic atherosclerosis (HCC) 07/06/2022   Encounter for screening mammogram for malignant neoplasm of breast 07/06/2022   Tinea pedis of right foot 07/06/2022   Tubular adenoma of colon  07/06/2022   Stage 3a chronic kidney disease (HCC) 03/15/2022   Pulmonary nodule less than 6 mm determined by computed tomography of lung 12/03/2021   Benign paroxysmal positional vertigo 09/03/2021   Healthcare maintenance 01/28/2021   Hyperlipidemia 11/22/2019   Paroxysmal atrial fibrillation (HCC) 08/24/2017   Chronic pain of both knees 08/16/2016   Back pain 06/23/2016   Seasonal and perennial allergic rhinitis 03/04/2015   Asthma with COPD 06/19/2014   HNP (herniated nucleus pulposus), lumbar 04/16/2013   Tenosynovitis, de Quervain 11/15/2012   Morbid obesity with BMI of 45.0-49.9, adult (HCC) 09/08/2012   Carpal tunnel syndrome 06/23/2011   Shoulder pain 09/02/2010   Sleep apnea 09/02/2010   GERD 12/24/2009   Chronic urticaria 12/16/2009   STRESS INCONTINENCE 01/24/2007   History of peptic ulcer disease 07/26/2006    Past Surgical History:  Procedure Laterality Date   BREAST EXCISIONAL BIOPSY Right 1994   MANDIBLE SURGERY     due to trauma   TUBAL LIGATION      OB History   No obstetric history on file.      Home Medications    Prior to Admission medications   Medication Sig Start Date End Date Taking?  Authorizing Provider  BREZTRI AEROSPHERE 160-9-4.8 MCG/ACT AERO INHALE 2 PUFFS INTO THE LUNGS TWICE DAILY 12/13/22  Yes [name], MD  Na Sulfate-K Sulfate-Mg Sulf 17.5-3.13-1.6 GM/177ML SOLN See admin instructions. 09/08/22  Yes [provider]  pantoprazole (PROTONIX) 40 MG tablet Take 1 tablet by mouth once daily 10/18/22  Yes Reymundo Poll, MD  albuterol (PROVENTIL) (2.5 MG/3ML) 0.083% nebulizer solution Use 1 vial in nebulizer up to 3 times a day when albuterol inhaler not working. If no better or getting worse, call 911 or physician line. 07/05/22   Dickie La, MD  albuterol (VENTOLIN HFA) 108 (90 Base) MCG/ACT inhaler Inhale 2 puffs into the lungs every 6 (six) hours as needed for wheezing or shortness of breath. 11/02/21   [name], MD   atorvastatin (LIPITOR) 20 MG tablet Take 1 tablet (20 mg total) by mouth daily. 10/13/22 01/11/23  Quintella Reichert, MD  clotrimazole (LOTRIMIN) 1 % cream Apply to right foot and between right toes twice daily for two weeks. 07/06/22   Dickie La, MD  EPINEPHrine (EPIPEN 2-PAK) 0.3 mg/0.3 mL IJ SOAJ injection Inject 0.3 mg into the muscle as needed (for allergic reaction). 11/02/21   [name], MD  Vitamin D, Cholecalciferol, 25 MCG (1000 UT) CAPS Take 1 capsule by mouth daily. 07/08/22   Dickie La, MD  Geoffry Paradise 150 MG/ML prefilled syringe Inject 150 mg into the skin every 28 (twenty-eight) days. 09/02/22   [provider]    Family History Family History  Problem Relation Age of Onset   Diabetes Mother    Coronary artery disease Mother 68       requiring 5 vessel CABG   Diabetes Sister    Breast cancer Sister 70       died 2/2 breast ca age 67-60   Colon polyps Neg Hx    Colon cancer Neg Hx    Esophageal cancer Neg Hx    Rectal cancer Neg Hx    Stomach cancer Neg Hx     Social History Social History   Tobacco Use   Smoking status: Former    Current packs/day: 0.00    Average packs/day: 0.5 packs/day for 40.0 years (20.0 ttl pk-yrs)    Types: Cigarettes    Start date: 12/04/1970    Quit date: 12/04/2010    Years since quitting: 12.0   Smokeless tobacco: Never  Vaping Use   Vaping status: Never Used  Substance Use Topics   Alcohol use: Not Currently   Drug use: No     Allergies   Bee venom, Aspirin, and Other   Review of Systems Review of Systems  Constitutional: Negative.   HENT: Negative.    Respiratory: Negative.    Cardiovascular: Negative.   Gastrointestinal: Negative.      Physical Exam Triage Vital Signs ED Triage Vitals  Encounter Vitals Group     BP 01/06/23 0951 111/74     Systolic BP Percentile --      Diastolic BP Percentile --      Pulse Rate 01/06/23 0951 (!) 58     Resp 01/06/23 0951 (!) 22     Temp 01/06/23 0951 98.4 F (36.9 C)      Temp Source 01/06/23 0951 Oral     SpO2 01/06/23 0951 95 %     Weight 01/06/23 0950 253 lb (114.8 kg)     Height 01/06/23 0950 5' (1.524 m)     Head Circumference --  Peak Flow --      Pain Score 01/06/23 0948 10     Pain Loc --      Pain Education --      Exclude from Growth Chart --    No data found.  Updated Vital Signs BP 111/74 (BP Location: Right Arm)   Pulse 60   Temp 98.4 F (36.9 C) (Oral)   Resp 20   Ht 5' (1.524 m)   Wt 114.8 kg   LMP 04/19/2004 (Approximate)   SpO2 96%   BMI 49.41 kg/m   Visual Acuity Right Eye Distance:   Left Eye Distance:   Bilateral Distance:    Right Eye Near:   Left Eye Near:    Bilateral Near:     Physical Exam Constitutional:      Appearance: Normal appearance.  Musculoskeletal:     Comments: No obvious swelling to the knee;  TTP to the medial knee joint;  pain with extension of the knee;  no pain with flexion per see;  unable to bear weight due to pain  Skin:    General: Skin is warm.  Neurological:     General: No focal deficit present.     Mental Status: She is alert.  Psychiatric:        Mood and Affect: Mood normal.      UC Treatments / Results  Labs (all labs ordered are listed, but only abnormal results are displayed) Labs Reviewed - No data to display  EKG   Radiology No results found.  Procedures Procedures (including critical care time)  Medications Ordered in UC Medications  ketorolac (TORADOL) 30 MG/ML injection 30 mg (30 mg Intramuscular Given 01/06/23 1108)    Initial Impression / Assessment and Plan / UC Course  I have reviewed the triage vital signs and the nursing notes.  Pertinent labs & imaging results that were available during my care of the patient were reviewed by me and considered in my medical decision making (see chart for details).   Final Clinical Impressions(s) / UC Diagnoses   Final diagnoses:  Acute pain of right knee     Discharge Instructions      You  were seen today for right knee pain.  Your xray will be resulted later today if there is anything abnormal we will notify you.  It appears normal to me.  You were given a shot of toradol for pain today.  You may take tylenol at home and ice the knee.  We have given you a knee brace for relief.  If not improving then please follow up with your primary care for further care/discussion.     ED Prescriptions   None    PDMP not reviewed this encounter.   Jannifer Franklin, MD 01/06/23 1119

## 2023-01-06 NOTE — Discharge Instructions (Addendum)
You were seen today for right knee pain.  Your xray will be resulted later today if there is anything abnormal we will notify you.  It appears normal to me.  You were given a shot of toradol for pain today.  You may take tylenol at home and ice the knee.  We have given you a knee brace for relief.  If not improving then please follow up with your primary care for further care/discussion.

## 2023-01-06 NOTE — ED Triage Notes (Signed)
"  This pain in my right knee just started Tuesday, It feel's like when I walk it is slipping out of socket". I have used OTC creams, ice packs, nothing works. No injury known. ? Swelling. No redness. No fever.

## 2023-01-12 ENCOUNTER — Ambulatory Visit (INDEPENDENT_AMBULATORY_CARE_PROVIDER_SITE_OTHER): Payer: 59

## 2023-01-12 DIAGNOSIS — L501 Idiopathic urticaria: Secondary | ICD-10-CM

## 2023-01-15 ENCOUNTER — Other Ambulatory Visit: Payer: Self-pay | Admitting: Internal Medicine

## 2023-01-26 ENCOUNTER — Ambulatory Visit: Payer: 59 | Admitting: Student

## 2023-01-26 ENCOUNTER — Other Ambulatory Visit (HOSPITAL_COMMUNITY): Payer: Self-pay

## 2023-01-26 VITALS — BP 134/84 | HR 69 | Temp 97.8°F | Ht 60.0 in | Wt 255.4 lb

## 2023-01-26 DIAGNOSIS — M25561 Pain in right knee: Secondary | ICD-10-CM | POA: Diagnosis not present

## 2023-01-26 HISTORY — DX: Pain in right knee: M25.561

## 2023-01-26 MED ORDER — METHYLPREDNISOLONE 4 MG PO TBPK: ORAL_TABLET | ORAL | 0 refills | Status: DC

## 2023-01-26 MED ORDER — OXYCODONE HCL 5 MG PO TABS: 5.0000 mg | ORAL_TABLET | Freq: Three times a day (TID) | ORAL | 0 refills | Status: DC | PRN

## 2023-01-26 MED ORDER — OXYCODONE-ACETAMINOPHEN 5-325 MG PO TABS: 1.0000 | ORAL_TABLET | Freq: Once | ORAL | Status: DC

## 2023-01-26 MED ORDER — COLCHICINE 0.6 MG PO TABS: 0.6000 mg | ORAL_TABLET | Freq: Every day | ORAL | 2 refills | Status: DC

## 2023-01-26 NOTE — Assessment & Plan Note (Addendum)
Patient presented today following an ED visit in mid September with ongoing acute right knee pain.  She says that the pain is 10 out of 10, located mostly anteriorly.  She can hardly walk on it.  She is tearful and uncomfortable during the interview.  She does endorse a history of gout with swelling in her big toe.  She said that was many years ago and she has not had symptoms since.  She was given Toradol in the ED which she says made her tired but did not help with her pain.  She says the pain is constant.  Constellation of symptoms, and history of gout I feel that this likely is a gout flare.  No fever, chills, or systemic symptoms to suggest infection. No trauma to the knee and past x-rays negative to suggest something like fracture.  Plan: Short course of oxycodone Medrol Dose pack Begin colchicine 0.6 mg-patient CKD 3A Instructed patient to go to Ortho urgent care to see if they are able to tap the joint for diagnostic purposes, rule out septic joint or other pathology. Follow-up 2 weeks or if symptoms fail to improve Uric acid levels once symptoms stabilized.

## 2023-01-26 NOTE — Progress Notes (Signed)
Subjective:  CC: Knee pain  HPI:  Maria Nelson is a 67 y.o. person with a past medical history stated below and presents today for knee pain. Please see problem based assessment and plan for additional details.  Past Medical History:  Diagnosis Date   Adnexal pain 09/06/2018   ANGIOEDEMA 12/16/2009   Asthma    Blood pressure elevated without history of HTN 03/05/2014   Carpal tunnel syndrome 06/23/2011   Patient notes history of bilateral carpal tunnel syndrome.  Now has symptoms of right hand carpal tunnel.    COPD (chronic obstructive pulmonary disease) (HCC)    secondary to tobacco use // No PFTs on file   COVID-19    Dyspepsia 08/24/2017   Emphysema of lung (HCC)    not on O2 at home (09/08/2022)   ETOH abuse    Pt stopped in 3/08   Gastrointestinal hemorrhage    secondary to PUD on March 2008   GERD (gastroesophageal reflux disease)    on meds   Low iron 12/03/2021   OSA on CPAP    noncompliant with CPAP (2/2 it being depressing)   Other fatigue 07/06/2022   Peptic ulcer disease    +H. pylori (antigen)- Dr. Juanda Chance- treated   STRESS INCONTINENCE 01/24/2007   Tobacco abuse    quit in 2010   Urinary incontinence    Urticaria    Urticaria 07/01/2015    Current Outpatient Medications on File Prior to Visit  Medication Sig Dispense Refill   albuterol (PROVENTIL) (2.5 MG/3ML) 0.083% nebulizer solution Use 1 vial in nebulizer up to 3 times a day when albuterol inhaler not working. If no better or getting worse, call 911 or physician line. 75 mL 3   albuterol (VENTOLIN HFA) 108 (90 Base) MCG/ACT inhaler Inhale 2 puffs into the lungs every 6 (six) hours as needed for wheezing or shortness of breath. 18 g 1   atorvastatin (LIPITOR) 20 MG tablet Take 1 tablet (20 mg total) by mouth daily. 90 tablet 3   BREZTRI AEROSPHERE 160-9-4.8 MCG/ACT AERO INHALE 2 PUFFS INTO THE LUNGS TWICE DAILY 11 g 0   clotrimazole (LOTRIMIN) 1 % cream Apply to right foot and  between right toes twice daily for two weeks. 60 g 0   EPINEPHrine (EPIPEN 2-PAK) 0.3 mg/0.3 mL IJ SOAJ injection Inject 0.3 mg into the muscle as needed (for allergic reaction). 1 each 1   Na Sulfate-K Sulfate-Mg Sulf 17.5-3.13-1.6 GM/177ML SOLN See admin instructions.     pantoprazole (PROTONIX) 40 MG tablet Take 1 tablet by mouth once daily 90 tablet 0   Vitamin D, Cholecalciferol, 25 MCG (1000 UT) CAPS Take 1 capsule by mouth daily. 90 capsule 3   XOLAIR 150 MG/ML prefilled syringe Inject 150 mg into the skin every 28 (twenty-eight) days.     Current Facility-Administered Medications on File Prior to Visit  Medication Dose Route Frequency Provider Last Rate Last Admin   omalizumab Geoffry Paradise) prefilled syringe 300 mg  300 mg Subcutaneous Q28 days Hetty Blend, FNP   300 mg at 01/12/23 1610    Review of Systems: Please see assessment and plan for pertinent positives and negatives.  Objective:   Vitals:   01/26/23 1408  BP: 134/84  Pulse: 69  Temp: 97.8 F (36.6 C)  TempSrc: Oral  SpO2: 100%  Weight: 255 lb 6.4 oz (115.8 kg)  Height: 5' (1.524 m)    Physical Exam: Constitutional: In distress, severe pain. Cardiovascular: regular rate and rhythm, no  m/r/g Pulmonary/Chest: normal work of breathing on room air, lungs clear to auscultation bilaterally Abdominal: soft, non-tender, non-distended Extremities: No edema of the lower extremities bilaterally MSK: Tenderness to palpation of the quadriceps tendon, soft tissue surrounding the patella.  Some increased swelling of the anterior right knee.  No frank erythema or warmth of the joint. Skin: warm and dry Psych: Tearful   Assessment & Plan:  Acute pain of right knee Patient presented today following an ED visit in mid September with ongoing acute right knee pain.  She says that the pain is 10 out of 10, located mostly anteriorly.  She can hardly walk on it.  She is tearful and uncomfortable during the interview.  She does endorse  a history of gout with swelling in her big toe.  She said that was many years ago and she has not had symptoms since.  She was given Toradol in the ED which she says made her tired but did not help with her pain.  She says the pain is constant.  Constellation of symptoms, and history of gout I feel that this likely is a gout flare.  Less likely septic joint.  No trauma to the knee and past x-rays negative to suggest something like fracture.  Plan: Short course of oxycodone Medrol Dose pack Begin colchicine 0.6 mg-patient CKD 3A Instructed patient to go to Ortho urgent care to see if they are able to tap the joint for diagnostic purposes, rule out septic joint or other pathology. Follow-up 2 weeks Uric acid levels once symptoms stabilized.    Patient seen with Dr. Lajean Silvius MD [facility] Internal Medicine  PGY-1 Pager: [phone]  Phone: [phone] Date 01/26/2023  Time 5:39 PM

## 2023-01-26 NOTE — Patient Instructions (Addendum)
Thank you, Maria Nelson Maria Nelson for allowing [place] to provide your care today.  I have ordered the following tests for you:  [facility] [place] in: [facility] Address: [address], [place], [place] [address] Phone: [phone]   Start the following medications: Meds ordered this encounter  Medications   oxyCODONE (ROXICODONE) 5 MG immediate release tablet    Sig: Take 1 tablet (5 mg total) by mouth every 8 (eight) hours as needed.    Dispense:  6 tablet    Refill:  0   colchicine 0.6 MG tablet    Sig: Take 1 tablet (0.6 mg total) by mouth daily.    Dispense:  30 tablet    Refill:  2   oxyCODONE-acetaminophen (PERCOCET/ROXICET) 5-325 MG per tablet 1 tablet   methylPREDNISolone (MEDROL DOSEPAK) 4 MG TBPK tablet    Sig: As directed    Dispense:  21 tablet    Refill:  0     Follow up:  2 Weeks  Please remember:  [facility] Located in: [facility] Address: [address], [place], [place] [address] Open ? Closes 5?PM Phone: [phone]   We look forward to seeing you next time. Please call our clinic at [phone] if you have any questions or concerns. The best time to call is Monday-Friday from 9am-4pm, but there is someone available 24/7. If after hours or the weekend, call the main hospital number and ask for the Internal Medicine Resident On-Call. If you need medication refills, please notify your pharmacy one week in advance and they will send [place] a request.   Thank you for trusting me with your care. Wishing you the best!  Lovie Macadamia MD [facility]

## 2023-01-27 DIAGNOSIS — M25461 Effusion, right knee: Secondary | ICD-10-CM | POA: Diagnosis not present

## 2023-01-27 DIAGNOSIS — M25561 Pain in right knee: Secondary | ICD-10-CM | POA: Diagnosis not present

## 2023-02-03 DIAGNOSIS — M25561 Pain in right knee: Secondary | ICD-10-CM | POA: Diagnosis not present

## 2023-02-07 DIAGNOSIS — M25561 Pain in right knee: Secondary | ICD-10-CM | POA: Diagnosis not present

## 2023-02-08 NOTE — Addendum Note (Signed)
Addended by: Debe Coder B on: 02/08/2023 05:40 AM   Modules accepted: Level of Service

## 2023-02-08 NOTE — Progress Notes (Signed)
 Internal Medicine Clinic Attending  I was physically present during the key portions of the resident provided service and participated in the medical decision making of patient's management care. I reviewed pertinent patient test results.  The assessment, diagnosis, and plan were formulated together and I agree with the documentation in the resident's note.  Inez Catalina, MD

## 2023-02-09 ENCOUNTER — Ambulatory Visit: Payer: 59 | Admitting: *Deleted

## 2023-02-09 DIAGNOSIS — L501 Idiopathic urticaria: Secondary | ICD-10-CM | POA: Diagnosis not present

## 2023-02-11 ENCOUNTER — Telehealth: Payer: Self-pay

## 2023-02-11 ENCOUNTER — Telehealth: Payer: Self-pay | Admitting: Internal Medicine

## 2023-02-11 MED ORDER — OXYCODONE HCL 5 MG PO TABS: 5.0000 mg | ORAL_TABLET | Freq: Three times a day (TID) | ORAL | 0 refills | Status: DC | PRN

## 2023-02-11 NOTE — Telephone Encounter (Signed)
Informed pt we do not see a MRI in her chart. Stated she was referred to [facility]; MRI done 10/21- instructed pt to call their office; stated she will.

## 2023-02-11 NOTE — Telephone Encounter (Signed)
The patient called the after hours pager stating that she continues to have 10/10 pain in her right knee. She is tearful on the phone and states that she cries every night in agonizing pain. She was given a short course of oxycodone after her visit with the [facility] on 10/9 and states that this helped to control her pain, but she is now out of that medicine. She followed up with her orthopedist, who got an MRI of her knee, but unfortunately, those results are not back yet. I will send in a short course of oxycodone 5 mg to get her through the weekend, but advised she sees her orthopedist again since her pain is uncontrolled.

## 2023-02-11 NOTE — Telephone Encounter (Signed)
I do not see any MRI results. Can you verify what she is looking for?

## 2023-02-11 NOTE — Telephone Encounter (Signed)
Requesting MRI results, please call pt back.  

## 2023-02-18 ENCOUNTER — Telehealth: Payer: Self-pay | Admitting: Student

## 2023-02-18 DIAGNOSIS — M25561 Pain in right knee: Secondary | ICD-10-CM

## 2023-02-18 MED ORDER — OXYCODONE HCL 5 MG PO TABS: 5.0000 mg | ORAL_TABLET | Freq: Every day | ORAL | 0 refills | Status: AC | PRN

## 2023-02-18 MED ORDER — ACETAMINOPHEN 500 MG PO TABS: 1000.0000 mg | ORAL_TABLET | Freq: Four times a day (QID) | ORAL | Status: AC | PRN

## 2023-02-18 MED ORDER — IBUPROFEN 200 MG PO TABS: 400.0000 mg | ORAL_TABLET | Freq: Three times a day (TID) | ORAL | Status: AC | PRN

## 2023-02-18 NOTE — Telephone Encounter (Signed)
Called for severe right knee pain, chronic. Has severe arthritis. Apparently had MRI knee in outside system recently, I don't see those results. Has orthopedic appointment on Wednesday, 02/23/23. She requests refill of opioid pain medicine to get her through that visit. I will offer short course today, also recommending 2 tablets extra strength Tylenol (1,000 mg) every 6 hours. I also recommend 2 tablets ibuprofen (400 mg) every 8 hours. She has remote history of stomach ulcer, but given the severe function limiting pain I think short course is reasonable in her case. She will double up on PPI for the next few days. 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 I think future providers should use caution with more controlled substance refills for Maria Nelson, but I'm reassured that she has an upcoming appointment with her knee specialist.  Maria Mood MD 02/18/2023, 5:29 PM

## 2023-02-21 ENCOUNTER — Encounter: Payer: Self-pay | Admitting: Internal Medicine

## 2023-02-21 ENCOUNTER — Ambulatory Visit (INDEPENDENT_AMBULATORY_CARE_PROVIDER_SITE_OTHER): Payer: 59 | Admitting: Internal Medicine

## 2023-02-21 VITALS — BP 113/77 | HR 68 | Ht 60.0 in | Wt 257.0 lb

## 2023-02-21 DIAGNOSIS — J309 Allergic rhinitis, unspecified: Secondary | ICD-10-CM

## 2023-02-21 DIAGNOSIS — Z5320 Procedure and treatment not carried out because of patient's decision for unspecified reasons: Secondary | ICD-10-CM

## 2023-02-21 DIAGNOSIS — G4733 Obstructive sleep apnea (adult) (pediatric): Secondary | ICD-10-CM | POA: Diagnosis not present

## 2023-02-21 DIAGNOSIS — J439 Emphysema, unspecified: Secondary | ICD-10-CM | POA: Diagnosis not present

## 2023-02-21 DIAGNOSIS — Z122 Encounter for screening for malignant neoplasm of respiratory organs: Secondary | ICD-10-CM | POA: Diagnosis not present

## 2023-02-21 DIAGNOSIS — J4489 Other specified chronic obstructive pulmonary disease: Secondary | ICD-10-CM

## 2023-02-21 DIAGNOSIS — Z23 Encounter for immunization: Secondary | ICD-10-CM

## 2023-02-21 NOTE — Patient Instructions (Signed)
It was a pleasure to see you today!  Please schedule follow up scheduled with myself in 6 months.  If my schedule is not open yet, we will contact you with a reminder closer to that time. Please call [phone] if you haven't heard from [place] a month before, and always call us sooner if issues or concerns arise. You can also send [place] a message through MyChart, but but aware that this is not to be used for urgent issues and it may take up to 5-7 days to receive a reply. Please be aware that you will likely be able to view your results before I have a chance to respond to them. Please give [place] 5 business days to respond to any non-urgent results.    Continue the breztri 2 puffs twice daily, with albuterol as needed. Continue the allergy meds with allegra as needed. Continue the xolair.  Your CT Scan in May 2024 looked good - no cancer. You are next due for a scan in May 2025.  Good luck with your knee pain, I hope it feels better soon.   Let me know if you're interested in trying CPAP again.

## 2023-02-21 NOTE — Progress Notes (Signed)
Maria Nelson    [id number]    19-Feb-1956  Primary Care Physician:Lau, Delorise Shiner, MD Date of Appointment: 02/21/2023 Established Patient Visit  Chief complaint:   Chief Complaint  Patient presents with   Follow-up    Breathing has been ok  ACT 23     HPI: Maria Nelson is a 67 y.o. woman with 40 pack year smoking history and copd  with untreated OHS/OSA. Quit smoking in 2015  Interval Updates: Here for follow up for asthma/copd.   Having mobility issues related to knee pain. Now walking with a cane. Seeing surgeon later this week.   No interval flares of COPD requiring prednisone - needing steroids for her knee.   Allergy symptoms well controlled  Current Regimen: Breztri 2 puffs twice a day, albuterol prn, xolair.  Asthma Triggers: seasonal allergies, URI Exacerbations in the last year:  December 2023 History of hospitalization or intubation: never Allergy Testing: yes in 2022 - multiple environmental aeroallergens GERD: yes on PPI with protonix daily.  Allergic Rhinitis: yes not currently on therapy, was prescribed allegra at last visit.  ACT:  Asthma Control Test ACT Total Score  02/21/2023 10:14 AM 23  09/01/2022 11:00 AM 19  08/25/2022  2:05 PM 15   FeNO:  IgE 1443 Absolute eosinophil count 1000 in 2016.  I have reviewed the patient's family social and past medical history and updated as appropriate.   Past Medical History:  Diagnosis Date   Adnexal pain 09/06/2018   ANGIOEDEMA 12/16/2009   Asthma    Blood pressure elevated without history of HTN 03/05/2014   Carpal tunnel syndrome 06/23/2011   Patient notes history of bilateral carpal tunnel syndrome.  Now has symptoms of right hand carpal tunnel.    COPD (chronic obstructive pulmonary disease) (HCC)    secondary to tobacco use // No PFTs on file   COVID-19    Dyspepsia 08/24/2017   Emphysema of lung (HCC)    not on O2 at home (09/08/2022)   ETOH abuse    Pt  stopped in 3/08   Gastrointestinal hemorrhage    secondary to PUD on March 2008   GERD (gastroesophageal reflux disease)    on meds   Low iron 12/03/2021   OSA on CPAP    noncompliant with CPAP (2/2 it being depressing)   Other fatigue 07/06/2022   Peptic ulcer disease    +H. pylori (antigen)- Dr. Juanda Chance- treated   STRESS INCONTINENCE 01/24/2007   Tobacco abuse    quit in 2010   Urinary incontinence    Urticaria    Urticaria 07/01/2015    Past Surgical History:  Procedure Laterality Date   BREAST EXCISIONAL BIOPSY Right 1994   MANDIBLE SURGERY     due to trauma   TUBAL LIGATION      Family History  Problem Relation Age of Onset   Diabetes Mother    Coronary artery disease Mother 61       requiring 5 vessel CABG   Diabetes Sister    Breast cancer Sister 44       died 2/2 breast ca age 23-60   Colon polyps Neg Hx    Colon cancer Neg Hx    Esophageal cancer Neg Hx    Rectal cancer Neg Hx    Stomach cancer Neg Hx     Social History   Occupational History   Occupation: Disability    Comment: previously worked in [employer] had to  stop 2/2 occupational exposures leading to exacerbations of asthma   Tobacco Use   Smoking status: Former    Current packs/day: 0.00    Average packs/day: 0.5 packs/day for 40.0 years (20.0 ttl pk-yrs)    Types: Cigarettes    Start date: 12/04/1970    Quit date: 12/04/2010    Years since quitting: 12.2   Smokeless tobacco: Never  Vaping Use   Vaping status: Never Used  Substance and Sexual Activity   Alcohol use: Not Currently   Drug use: No   Sexual activity: Not Currently     Physical Exam: Blood pressure 113/77, pulse 68, height 5' (1.524 m), weight 257 lb (116.6 kg), last menstrual period 04/19/2004, SpO2 93%.  Gen:      No acute distress, walking with cane, obese ENT:  mallampati IV, no nasal polyps, mucus membranes moist Lungs:  ctab no wheezes or crackles CV:         RRR no edema    Data Reviewed: Imaging: I have  personally reviewed the CT Chest May 2024 - resolved 5mm nodule. RML atelectasis. No concerning nodules for lung cancer. Mild centrilobular emphysema.  PFTs:     Latest Ref Rng & Units 03/11/2014   12:16 PM  PFT Results  FVC-Pre L 1.50   FVC-Predicted Pre % 66   FVC-Post L 1.75   FVC-Predicted Post % 77   Pre FEV1/FVC % % 55   Post FEV1/FCV % % 51   FEV1-Pre L 0.82   FEV1-Predicted Pre % 46   FEV1-Post L 0.90   DLCO uncorrected ml/min/mmHg 7.57   DLCO UNC% % 40   DLCO corrected ml/min/mmHg 7.57   DLCO COR %Predicted % 40   DLVA Predicted % 79   TLC L 3.88   TLC % Predicted % 87   RV % Predicted % 125    I have personally reviewed the patient's PFTs and moderate airflow limitation confirmed on most recent spirometry from allergy clinic  Labs: Elevated IgE, multiple grass and tree sensitivities.  Lab Results  Component Value Date   NA 141 07/05/2022   K 4.3 07/05/2022   CO2 22 07/05/2022   GLUCOSE 77 07/05/2022   BUN 11 07/05/2022   CREATININE 0.96 07/05/2022   CALCIUM 9.9 07/05/2022   EGFR 65 07/05/2022   GFRNONAA 45 (L) 11/29/2021   Lab Results  Component Value Date   WBC 9.6 07/05/2022   HGB 14.3 07/05/2022   HCT 45.5 07/05/2022   MCV 79 07/05/2022   PLT 266 07/05/2022    Immunization status: Immunization History  Administered Date(s) Administered   Fluad Quad(high Dose 65+) 01/28/2021, 03/15/2022   Influenza Whole 01/24/2007, 06/06/2009, 12/24/2009, 12/25/2010   Influenza,inj,Quad PF,6+ Mos 02/14/2013, 03/05/2014, 02/11/2016, 05/10/2018, 12/05/2018   PFIZER(Purple Top)SARS-COV-2 Vaccination 08/21/2019   PNEUMOCOCCAL CONJUGATE-20 07/13/2021   PPD Test 09/21/2022   Pneumococcal Polysaccharide-23 06/06/2009   Tdap 06/23/2011, 07/25/2022    External Records Personally Reviewed: allergy.   Assessment:  Dyspnea on exertion - multifactorial from COPD, obesity, untreated OHS/OSA Asthma COPD overlap syndrome - predominantly copd.  Chronic urticaria,  Elevated IgE with allergic rhinitis, on xolair Need for lung cancer screening - Solitary pulmonary nodule, 4mm, high risk patient - resolved OSA not on CPAP  Continue the breztri 2 puffs twice daily, with albuterol as needed. Continue the allergy meds with allegra as needed. Continue the xolair.  Your CT Scan in May 2024 looked good - no cancer. You are next due for a scan in May  2025.  Good luck with your knee pain, I hope it feels better soon.   Let me know if you're interested in trying CPAP again.   Return to Care: Return in about 6 months (around 08/21/2023).   Durel Salts, MD Pulmonary and Critical Care Medicine [facility] Office:[phone]

## 2023-02-23 ENCOUNTER — Telehealth: Payer: Self-pay | Admitting: *Deleted

## 2023-02-23 DIAGNOSIS — M25561 Pain in right knee: Secondary | ICD-10-CM | POA: Diagnosis not present

## 2023-02-23 NOTE — Telephone Encounter (Signed)
   Pre-operative Risk Assessment    Patient Name: Maria Nelson  DOB: 1956-02-20 MRN: [id number]  DATE OF LAST VISIT: 10/13/22 DR. TURNER DATE OF NEXT VISIT: NONE    Request for Surgical Clearance    Procedure:   RIGHT KNEE SCOPE  Date of Surgery:  Clearance TBD                                 Surgeon:  DR. Margarita Rana  Surgeon's Group or Practice Name:  [facility] Phone number:  [phone] EXT [phone] ATTN: KELLY HIGH Fax number:  [phone]   Type of Clearance Requested:   - Medical ; NONE INDICATED ON CLEARANCE FORM   Type of Anesthesia:   CHOICE   Additional requests/questions:    [name]   02/23/2023, 2:20 PM

## 2023-02-23 NOTE — Telephone Encounter (Signed)
   Name: Maria Nelson  DOB: 21-Sep-1955  MRN: [id number]  Primary Cardiologist: None   Preoperative team, please contact this patient and set up a phone call appointment for further preoperative risk assessment. Please obtain consent and complete medication review. Thank you for your help.  I confirm that guidance regarding antiplatelet and oral anticoagulation therapy has been completed and, if necessary, noted below.  None  I also confirmed the patient resides in the state of [place]. As per [facility] Medical Board telemedicine laws, the patient must reside in the state in which the provider is licensed.   Napoleon Form, Leodis Rains, NP 02/23/2023, 3:51 PM [facility]

## 2023-02-23 NOTE — Telephone Encounter (Signed)
Pt has been scheduled tele pre op appt 03/03/23. Pt states surgeon won't schedule surgery until she has been cleared.   Med rec and consent are done.

## 2023-02-23 NOTE — Telephone Encounter (Signed)
Pt has been scheduled tele pre op appt 03/03/23. Pt states surgeon won't schedule surgery until she has been cleared.   Med rec and consent are done.     Patient Consent for Virtual Visit        [name] has provided verbal consent on 02/23/2023 for a virtual visit (video or telephone).   CONSENT FOR VIRTUAL VISIT FOR:  Maria Nelson  By participating in this virtual visit I agree to the following:  I hereby voluntarily request, consent and authorize [facility] and its employed or contracted physicians, physician assistants, nurse practitioners or other licensed health care professionals (the Practitioner), to provide me with telemedicine health care services (the "Services") as deemed necessary by the treating Practitioner. I acknowledge and consent to receive the Services by the Practitioner via telemedicine. I understand that the telemedicine visit will involve communicating with the Practitioner through live audiovisual communication technology and the disclosure of certain medical information by electronic transmission. I acknowledge that I have been given the opportunity to request an in-person assessment or other available alternative prior to the telemedicine visit and am voluntarily participating in the telemedicine visit.  I understand that I have the right to withhold or withdraw my consent to the use of telemedicine in the course of my care at any time, without affecting my right to future care or treatment, and that the Practitioner or I may terminate the telemedicine visit at any time. I understand that I have the right to inspect all information obtained and/or recorded in the course of the telemedicine visit and may receive copies of available information for a reasonable fee.  I understand that some of the potential risks of receiving the Services via telemedicine include:  Delay or interruption in medical evaluation due to  technological equipment failure or disruption; Information transmitted may not be sufficient (e.g. poor resolution of images) to allow for appropriate medical decision making by the Practitioner; and/or  In rare instances, security protocols could fail, causing a breach of personal health information.  Furthermore, I acknowledge that it is my responsibility to provide information about my medical history, conditions and care that is complete and accurate to the best of my ability. I acknowledge that Practitioner's advice, recommendations, and/or decision may be based on factors not within their control, such as incomplete or inaccurate data provided by me or distortions of diagnostic images or specimens that may result from electronic transmissions. I understand that the practice of medicine is not an exact science and that Practitioner makes no warranties or guarantees regarding treatment outcomes. I acknowledge that a copy of this consent can be made available to me via my patient portal [facility] MyChart), or I can request a printed copy by calling the office of [facility].    I understand that my insurance will be billed for this visit.   I have read or had this consent read to me. I understand the contents of this consent, which adequately explains the benefits and risks of the Services being provided via telemedicine.  I have been provided ample opportunity to ask questions regarding this consent and the Services and have had my questions answered to my satisfaction. I give my informed consent for the services to be provided through the use of telemedicine in my medical care

## 2023-02-28 ENCOUNTER — Telehealth: Payer: Self-pay | Admitting: Internal Medicine

## 2023-02-28 NOTE — Telephone Encounter (Signed)
Fax received from Dr. Eulah Pont with [facility] to perform a right knee scope on patient under choice anesthesia  Patient needs surgery clearance. Surgery is TBD. Patient was seen on 02/21/23. Office protocol is a risk assessment can be sent to surgeon if patient has been seen in 60 days or less.   Sending to Dr Celine Mans for risk assessment or recommendations if patient needs to be seen in office prior to surgical procedure.

## 2023-02-28 NOTE — Telephone Encounter (Signed)
Received request for preoperative evaluation for knee replacement for [facility].   Preoperative Risk Calculation: Postoperative respiratory failure (PRF) is considered as failure to wean from mechanical ventilation within 48 hours of surgery or unplanned intubation/reintubation postoperatively. The validated risk calculator provides a risk estimate of PRF and is anticipated to aid in surgical decision-making and informed patient consent.  However risk can be accepted given the potential benefit of this intervention and it is not prohibitive.  The features of this patient's history that contribute to the pulmonary risk assessment include:  Age, COPD, General anesthesia   ASSESSMENT: - This patient has a low risk of post-operative pulmonary complications by ARISCAT Index.  The absolute assessment of risk/benefit of the procedure is deferred to the primary team's evaluation.  0 to 25 points: Low risk: 1.6% pulmonary complication rate  26 to 44 points: Intermediate risk: 13.3% pulmonary complication rate  45 to 123 points: High risk: 42.1% pulmonary complication rate  RECOMMENDATIONS:  In order to minimize the risk of complications and optimize pulmonary status, we recommend the following: - Encourage aggressive incentive spirometry hourly both peri-operatively and post-operatively as tolerated  - Early ambulation and physical therapy as tolerated post-operatively - Adequate pain control especially in the setting of abdominal and thoracic surgery - Bronchodilators as needed for wheezing or shortness of breath - Oral steroids only if the patient appears to have component of COPD exacerbation, otherwise no routine utilization needed - Ideally recommend smoking cessation for >4 weeks before any intervention - Intraoperatively keep OR time to the shortest as possible - Post operatively may benefit from Nocturnal Bipap   ARISCAT: Mazo et al. Anesthesiology 2014;  [phone]  Durel Salts, MD Pulmonary and Critical Care Medicine [facility] 02/28/2023 11:13 AM Pager: see AMION  If no response to pager, please call critical care on call (see AMION) until 7pm After 7:00 pm call Elink

## 2023-02-28 NOTE — Telephone Encounter (Signed)
Copy of this note faxed to [facility] at [phone].

## 2023-03-02 ENCOUNTER — Encounter: Payer: Self-pay | Admitting: Internal Medicine

## 2023-03-02 ENCOUNTER — Ambulatory Visit: Payer: 59 | Admitting: Internal Medicine

## 2023-03-02 VITALS — BP 120/82 | HR 64 | Temp 97.7°F | Wt 260.7 lb

## 2023-03-02 DIAGNOSIS — Z6841 Body Mass Index (BMI) 40.0 and over, adult: Secondary | ICD-10-CM | POA: Diagnosis not present

## 2023-03-02 DIAGNOSIS — M25561 Pain in right knee: Secondary | ICD-10-CM | POA: Diagnosis not present

## 2023-03-02 MED ORDER — OXYCODONE HCL 5 MG PO TABS: 5.0000 mg | ORAL_TABLET | Freq: Every evening | ORAL | 0 refills | Status: DC | PRN

## 2023-03-02 MED ORDER — SEMAGLUTIDE-WEIGHT MANAGEMENT 0.5 MG/0.5ML ~~LOC~~ SOAJ: 0.5000 mg | SUBCUTANEOUS | 0 refills | Status: AC

## 2023-03-02 MED ORDER — SEMAGLUTIDE-WEIGHT MANAGEMENT 1 MG/0.5ML ~~LOC~~ SOAJ: 1.0000 mg | SUBCUTANEOUS | 0 refills | Status: DC

## 2023-03-02 MED ORDER — SEMAGLUTIDE-WEIGHT MANAGEMENT 0.25 MG/0.5ML ~~LOC~~ SOAJ: 0.2500 mg | SUBCUTANEOUS | 0 refills | Status: AC

## 2023-03-02 MED ORDER — SEMAGLUTIDE-WEIGHT MANAGEMENT 2.4 MG/0.75ML ~~LOC~~ SOAJ: 2.4000 mg | SUBCUTANEOUS | 0 refills | Status: DC

## 2023-03-02 MED ORDER — SEMAGLUTIDE-WEIGHT MANAGEMENT 1.7 MG/0.75ML ~~LOC~~ SOAJ: 1.7000 mg | SUBCUTANEOUS | 0 refills | Status: DC

## 2023-03-02 NOTE — Patient Instructions (Addendum)
Thank you, [name] for allowing [place] to provide your care today.   Knee pain Weight loss goal I have sent in wegovy, this is once a week injection to help with weight loss. I have also referred you to [name], the clinic dietician.   I do think going to physical therapy would be helpful. Please consider going to [name] at [facility] office. I do think strength ing muscles around knee will be helpful.  Pain I am sending in 30 tablets of oxycodone 5 mg, take these at night to sleep. Please follow-up in 3-4 weeks. Continue taking tylenol 1000 mg every 6 hours. We will need to complete paperwork if we come to decision that you will need this for a few months.  I have ordered the following medication/changed the following medications:   Stop the following medications: Medications Discontinued During This Encounter  Medication Reason   methylPREDNISolone (MEDROL DOSEPAK) 4 MG TBPK tablet    colchicine 0.6 MG tablet      Start the following medications: Meds ordered this encounter  Medications   Semaglutide-Weight Management 0.25 MG/0.5ML SOAJ    Sig: Inject 0.25 mg into the skin once a week for 28 days.    Dispense:  2 mL    Refill:  0   Semaglutide-Weight Management 0.5 MG/0.5ML SOAJ    Sig: Inject 0.5 mg into the skin once a week for 28 days.    Dispense:  2 mL    Refill:  0   Semaglutide-Weight Management 1 MG/0.5ML SOAJ    Sig: Inject 1 mg into the skin once a week for 28 days.    Dispense:  2 mL    Refill:  0   Semaglutide-Weight Management 1.7 MG/0.75ML SOAJ    Sig: Inject 1.7 mg into the skin once a week for 28 days.    Dispense:  3 mL    Refill:  0   Semaglutide-Weight Management 2.4 MG/0.75ML SOAJ    Sig: Inject 2.4 mg into the skin once a week for 28 days.    Dispense:  3 mL    Refill:  0   oxyCODONE (ROXICODONE) 5 MG immediate release tablet    Sig: Take 1 tablet (5 mg total) by mouth at bedtime as needed.    Dispense:  30  tablet    Refill:  0     Follow up:  4 weeks    We look forward to seeing you next time. Please call our clinic at [phone] if you have any questions or concerns. The best time to call is Monday-Friday from 9am-4pm, but there is someone available 24/7. If after hours or the weekend, call the main hospital number and ask for the Internal Medicine Resident On-Call. If you need medication refills, please notify your pharmacy one week in advance and they will send [place] a request.   Thank you for trusting me with your care. Wishing you the best!   Rudene Christians, DO [facility]

## 2023-03-02 NOTE — Progress Notes (Unsigned)
Subjective:  CC: right knee pain  HPI:  Maria Nelson is a 67 y.o. female with a past medical history of asthma-COPD overlap and presents today for right knee pain. She was seen on 10/09 and with concern for septic arthrtis was referred to ortho urgent clinic. She followed up with [facility]. Unable to see notes, but patient reports aspiration completed and no concern for infection. She received a steroid injection, but pain improved for only about 2 days. She had MRI completed that I am not able to see. Dr. Eulah Pont is report ably planning to due menisectomy surgery the the coming months.  Please see problem based assessment and plan for additional details.  Past Medical History:  Diagnosis Date   Adnexal pain 09/06/2018   ANGIOEDEMA 12/16/2009   Asthma    Blood pressure elevated without history of HTN 03/05/2014   Carpal tunnel syndrome 06/23/2011   Patient notes history of bilateral carpal tunnel syndrome.  Now has symptoms of right hand carpal tunnel.    COPD (chronic obstructive pulmonary disease) (HCC)    secondary to tobacco use // No PFTs on file   COVID-19    Dyspepsia 08/24/2017   Emphysema of lung (HCC)    not on O2 at home (09/08/2022)   ETOH abuse    Pt stopped in 3/08   Gastrointestinal hemorrhage    secondary to PUD on March 2008   GERD (gastroesophageal reflux disease)    on meds   Low iron 12/03/2021   OSA on CPAP    noncompliant with CPAP (2/2 it being depressing)   Other fatigue 07/06/2022   Peptic ulcer disease    +H. pylori (antigen)- Dr. Juanda Chance- treated   STRESS INCONTINENCE 01/24/2007   Tobacco abuse    quit in 2010   Urinary incontinence    Urticaria    Urticaria 07/01/2015    Current Outpatient Medications on File Prior to Visit  Medication Sig Dispense Refill   acetaminophen (TYLENOL) 500 MG tablet Take 2 tablets (1,000 mg total) by mouth every 6 (six) hours as needed.     albuterol (PROVENTIL) (2.5 MG/3ML) 0.083%  nebulizer solution Use 1 vial in nebulizer up to 3 times a day when albuterol inhaler not working. If no better or getting worse, call 911 or physician line. 75 mL 3   albuterol (VENTOLIN HFA) 108 (90 Base) MCG/ACT inhaler Inhale 2 puffs into the lungs every 6 (six) hours as needed for wheezing or shortness of breath. 18 g 1   atorvastatin (LIPITOR) 20 MG tablet Take 1 tablet (20 mg total) by mouth daily. 90 tablet 3   BREZTRI AEROSPHERE 160-9-4.8 MCG/ACT AERO INHALE 2 PUFFS INTO THE LUNGS TWICE DAILY 11 g 0   clotrimazole (LOTRIMIN) 1 % cream Apply to right foot and between right toes twice daily for two weeks. 60 g 0   EPINEPHrine (EPIPEN 2-PAK) 0.3 mg/0.3 mL IJ SOAJ injection Inject 0.3 mg into the muscle as needed (for allergic reaction). 1 each 1   Na Sulfate-K Sulfate-Mg Sulf 17.5-3.13-1.6 GM/177ML SOLN See admin instructions.     pantoprazole (PROTONIX) 40 MG tablet Take 1 tablet by mouth once daily 90 tablet 0   Vitamin D, Cholecalciferol, 25 MCG (1000 UT) CAPS Take 1 capsule by mouth daily. 90 capsule 3   XOLAIR 150 MG/ML prefilled syringe Inject 150 mg into the skin every 28 (twenty-eight) days.     Current Facility-Administered Medications on File Prior to Visit  Medication Dose Route Frequency  Provider Last Rate Last Admin   omalizumab Geoffry Paradise) prefilled syringe 300 mg  300 mg Subcutaneous Q28 days Hetty Blend, FNP   300 mg at 02/09/23 1150    Family History  Problem Relation Age of Onset   Diabetes Mother    Coronary artery disease Mother 23       requiring 5 vessel CABG   Diabetes Sister    Breast cancer Sister 23       died 2/2 breast ca age 28-60   Colon polyps Neg Hx    Colon cancer Neg Hx    Esophageal cancer Neg Hx    Rectal cancer Neg Hx    Stomach cancer Neg Hx     Social History   Socioeconomic History   Marital status: Single    Spouse name: Not on file   Number of children: 3   Years of education: 12th grade   Highest education level: Not on file   Occupational History   Occupation: Disability    Comment: previously worked in [employer] had to stop 2/2 occupational exposures leading to exacerbations of asthma   Tobacco Use   Smoking status: Former    Current packs/day: 0.00    Average packs/day: 0.5 packs/day for 40.0 years (20.0 ttl pk-yrs)    Types: Cigarettes    Start date: 12/04/1970    Quit date: 12/04/2010    Years since quitting: 12.2   Smokeless tobacco: Never  Vaping Use   Vaping status: Never Used  Substance and Sexual Activity   Alcohol use: Not Currently   Drug use: No   Sexual activity: Not Currently  Other Topics Concern   Not on file  Social History Narrative   Pt is not working now, was previously a Conservation officer, nature at [employer].      Pt lives with her son      Pt has received financial assistance approval for 100% discount at [facility] and has [facility] card.    Social Determinants of Health   Financial Resource Strain: Low Risk  (09/22/2022)   Overall Financial Resource Strain (CARDIA)    Difficulty of Paying Living Expenses: Not hard at all  Food Insecurity: No Food Insecurity (09/22/2022)   Hunger Vital Sign    Worried About Running Out of Food in the Last Year: Never true    Ran Out of Food in the Last Year: Never true  Transportation Needs: No Transportation Needs (09/22/2022)   PRAPARE - Administrator, Civil Service (Medical): No    Lack of Transportation (Non-Medical): No  Physical Activity: Sufficiently Active (09/22/2022)   Exercise Vital Sign    Days of Exercise per Week: 3 days    Minutes of Exercise per Session: 60 min  Stress: No Stress Concern Present (09/22/2022)   [employer] of Occupational Health - Occupational Stress Questionnaire    Feeling of Stress : Not at all  Social Connections: Moderately Isolated (09/22/2022)   Social Connection and Isolation Panel [NHANES]    Frequency of Communication with Friends and Family: More than three times a week    Frequency of Social  Gatherings with Friends and Family: Twice a week    Attends Religious Services: 1 to 4 times per year    Active Member of [employer] or Organizations: No    Attends Banker Meetings: Never    Marital Status: Divorced  Catering manager Violence: Not At Risk (09/22/2022)   Humiliation, Afraid, Rape, and Kick questionnaire  Fear of Current or Ex-Partner: No    Emotionally Abused: No    Physically Abused: No    Sexually Abused: No    Review of Systems: ROS negative except for what is noted on the assessment and plan.  Objective:   Vitals:   03/02/23 0900  BP: 120/82  Pulse: 64  Temp: 97.7 F (36.5 C)  TempSrc: Oral  SpO2: 98%  Weight: 260 lb 11.2 oz (118.3 kg)    Physical Exam: Constitutional: well-appearing  HENT: normocephalic atraumatic, mucous membranes moist Eyes: conjunctiva non-erythematous Neck: supple Cardiovascular: regular rate and rhythm, no m/r/g Pulmonary/Chest: normal work of breathing on room air, lungs clear to auscultation bilaterally Abdominal: soft, non-tender, non-distended MSK: normal bulk and tone Neurological: alert & oriented x 3, 5/5 strength in bilateral upper and lower extremities, normal gait Skin: warm and dry  Assessment & Plan:  Morbid obesity with BMI of 45.0-49.9, adult (HCC) Since onset of knee pain in October, she has gained about 7lbs. She feels that she is eating a well balanced diet but is limited with activity in setting of severe knee pain. Per patient she is not able to get knee menisectomy until she loses some weight P: Referral to [facility]  Acute pain of right knee Patient with function limiting knee pain since 9/24.  She did not have trauma or fall leading to pain. She received steroid injection but this only helped for 2 days. Tylenol has not improved pain, she is taking 1000 mg BID. She does not take ibuprofen. Pain is worst at night and she has difficulty finding a position that her knee is ok in. She has  been taking benadryl to try to sleep at night with minimal improvement. I am not able to view MRI but xray from ED visit showed medial compartment OA with osteophytosis. -PT referral placed to [name] at [facility] at [place] -record request sent for [facility] notes and MRI -increase tylenol to 1000 mg q 6 hrs -oxycodone 5 mg at bedtime, #30 tabs sent I talked with patient about expectation to take medication as written. I hope that this will be a short term course until she is able to get knee surgery but also set expectation that we would need to complete patient opioid agreement at follow-up to continue prescribing.   Patient discussed with Dr. Marjorie Smolder Arval Brandstetter, D.O. [facility]  PGY-3 Pager: [phone]  Phone: [phone] Date 03/03/2023  Time 9:11 AM

## 2023-03-03 ENCOUNTER — Ambulatory Visit: Payer: 59 | Attending: Cardiology | Admitting: Nurse Practitioner

## 2023-03-03 ENCOUNTER — Telehealth: Payer: Self-pay

## 2023-03-03 DIAGNOSIS — Z01810 Encounter for preprocedural cardiovascular examination: Secondary | ICD-10-CM

## 2023-03-03 NOTE — Telephone Encounter (Signed)
Prior Authorization for patient (Semaglutide-Weight Management 0.25 MG/0.5ML ) came through on cover my meds was submitted with last office notes awaiting approval or denial.  WUJ:[id number]

## 2023-03-03 NOTE — Progress Notes (Addendum)
Virtual Visit via Telephone Note   Because of Maria Nelson's co-morbid illnesses, she is at least at moderate risk for complications without adequate follow up.  This format is felt to be most appropriate for this patient at this time.  The patient did not have access to video technology/had technical difficulties with video requiring transitioning to audio format only (telephone).  All issues noted in this document were discussed and addressed.  No physical exam could be performed with this format.  Please refer to the patient's chart for her consent to telehealth for Maria Nelson.  Evaluation Performed:  Preoperative cardiovascular risk assessment _____________   Date:  03/03/2023   Patient ID:  Maria Nelson, Maria Nelson 07/23/1955, MRN [id number] Patient Location:  Home Provider location:   Office  Primary Care Provider:  Dickie La, MD Primary Cardiologist:  Armanda Magic, MD  Chief Complaint / Patient Profile   67 y.o. y/o female with a h/o nonobstructive CAD, paroxysmal atrial fibrillation not on anticoagulation, hyperlipidemia, chronic dyspnea, OSA, and obesity who is pending knee scope with Dr. Margarita Rana of [facility] and presents today for telephonic preoperative cardiovascular risk assessment.  History of Present Illness    Maria Nelson is a 67 y.o. female who presents via audio/video conferencing for a telehealth visit today.  Pt was last seen in cardiology clinic on 10/13/2022 by Dr. Mayford Knife.  At that time Maria Nelson was doing well. The patient is now pending procedure as outlined above. Since her last visit, she has been stable from a cardiac standpoint.   She denies chest pain, palpitations, dyspnea, pnd, orthopnea, n, v, dizziness, syncope, edema, weight gain, or early satiety. All other systems reviewed and are otherwise negative except as noted above.   Past Medical History    Past  Medical History:  Diagnosis Date   Adnexal pain 09/06/2018   ANGIOEDEMA 12/16/2009   Asthma    Blood pressure elevated without history of HTN 03/05/2014   Carpal tunnel syndrome 06/23/2011   Patient notes history of bilateral carpal tunnel syndrome.  Now has symptoms of right hand carpal tunnel.    COPD (chronic obstructive pulmonary disease) (HCC)    secondary to tobacco use // No PFTs on file   COVID-19    Dyspepsia 08/24/2017   Emphysema of lung (HCC)    not on O2 at home (09/08/2022)   ETOH abuse    Pt stopped in 3/08   Gastrointestinal hemorrhage    secondary to PUD on March 2008   GERD (gastroesophageal reflux disease)    on meds   Low iron 12/03/2021   OSA on CPAP    noncompliant with CPAP (2/2 it being depressing)   Other fatigue 07/06/2022   Peptic ulcer disease    +H. pylori (antigen)- Dr. Juanda Chance- treated   STRESS INCONTINENCE 01/24/2007   Tobacco abuse    quit in 2010   Urinary incontinence    Urticaria    Urticaria 07/01/2015   Past Surgical History:  Procedure Laterality Date   BREAST EXCISIONAL BIOPSY Right 1994   MANDIBLE SURGERY     due to trauma   TUBAL LIGATION      Allergies  Allergies  Allergen Reactions   Bee Venom Anaphylaxis   Aspirin Nausea Only   Other Rash    EKG pads cause burning sensation as soon as applied    Home Medications    Prior to Admission medications   Medication Sig Start Date End Date  Taking? Authorizing Provider  acetaminophen (TYLENOL) 500 MG tablet Take 2 tablets (1,000 mg total) by mouth every 6 (six) hours as needed. 02/18/23   Lovie Macadamia, MD  albuterol (PROVENTIL) (2.5 MG/3ML) 0.083% nebulizer solution Use 1 vial in nebulizer up to 3 times a day when albuterol inhaler not working. If no better or getting worse, call 911 or physician line. 07/05/22   Dickie La, MD  albuterol (VENTOLIN HFA) 108 (90 Base) MCG/ACT inhaler Inhale 2 puffs into the lungs every 6 (six) hours as needed for wheezing or shortness of  breath. 11/02/21   [name], MD  atorvastatin (LIPITOR) 20 MG tablet Take 1 tablet (20 mg total) by mouth daily. 10/13/22 01/11/23  Quintella Reichert, MD  BREZTRI AEROSPHERE 160-9-4.8 MCG/ACT AERO INHALE 2 PUFFS INTO THE LUNGS TWICE DAILY 12/13/22   [name], MD  clotrimazole (LOTRIMIN) 1 % cream Apply to right foot and between right toes twice daily for two weeks. 07/06/22   Dickie La, MD  EPINEPHrine (EPIPEN 2-PAK) 0.3 mg/0.3 mL IJ SOAJ injection Inject 0.3 mg into the muscle as needed (for allergic reaction). 11/02/21   [name], MD  Na Sulfate-K Sulfate-Mg Sulf 17.5-3.13-1.6 GM/177ML SOLN See admin instructions. 09/08/22   [provider]  oxyCODONE (ROXICODONE) 5 MG immediate release tablet Take 1 tablet (5 mg total) by mouth at bedtime as needed. 03/02/23 03/01/24  Masters, Florentina Addison, DO  pantoprazole (PROTONIX) 40 MG tablet Take 1 tablet by mouth once daily 01/17/23   Dickie La, MD  Semaglutide-Weight Management 0.25 MG/0.5ML SOAJ Inject 0.25 mg into the skin once a week for 28 days. 03/02/23 03/30/23  Masters, Katie, DO  Semaglutide-Weight Management 0.5 MG/0.5ML SOAJ Inject 0.5 mg into the skin once a week for 28 days. 03/31/23 04/28/23  Masters, Katie, DO  Semaglutide-Weight Management 1 MG/0.5ML SOAJ Inject 1 mg into the skin once a week for 28 days. 04/29/23 05/27/23  Masters, Florentina Addison, DO  Semaglutide-Weight Management 1.7 MG/0.75ML SOAJ Inject 1.7 mg into the skin once a week for 28 days. 05/28/23 06/25/23  Masters, Florentina Addison, DO  Semaglutide-Weight Management 2.4 MG/0.75ML SOAJ Inject 2.4 mg into the skin once a week for 28 days. 06/26/23 07/24/23  Masters, Katie, DO  Vitamin D, Cholecalciferol, 25 MCG (1000 UT) CAPS Take 1 capsule by mouth daily. 07/08/22   Dickie La, MD  Geoffry Paradise 150 MG/ML prefilled syringe Inject 150 mg into the skin every 28 (twenty-eight) days. 09/02/22   [provider]    Physical Exam    Vital Signs:  [name] does not have vital signs  available for review today.  Given telephonic nature of communication, physical exam is limited. AAOx3. NAD. Normal affect.  Speech and respirations are unlabored.  Accessory Clinical Findings    None  Assessment & Plan    1.  Preoperative Cardiovascular Risk Assessment:  According to the Revised Cardiac Risk Index (RCRI), her Perioperative Risk of Major Cardiac Event is (%): 0.4. Her Functional Capacity in METs is: 5.78 according to the Duke Activity Status Index (DASI).Therefore, based on ACC/AHA guidelines, patient would be at acceptable risk for the planned procedure without further cardiovascular testing.   The patient was advised that if she develops new symptoms prior to surgery to contact our office to arrange for a follow-up visit, and she verbalized understanding.   A copy of this note will be routed to requesting surgeon.  Time:   Today, I have spent 6 minutes with the patient with telehealth  technology discussing medical history, symptoms, and management plan.     Joylene Grapes, NP  03/03/2023, 9:56 AM

## 2023-03-03 NOTE — Assessment & Plan Note (Signed)
Since onset of knee pain in October, she has gained about 7lbs. She feels that she is eating a well balanced diet but is limited with activity in setting of severe knee pain. Per patient she is not able to get knee menisectomy until she loses some weight P: Referral to [facility]

## 2023-03-03 NOTE — Assessment & Plan Note (Addendum)
Patient with function limiting knee pain since 9/24.  She did not have trauma or fall leading to pain. She received steroid injection but this only helped for 2 days. Tylenol has not improved pain, she is taking 1000 mg BID. She does not take ibuprofen. Pain is worst at night and she has difficulty finding a position that her knee is ok in. She has been taking benadryl to try to sleep at night with minimal improvement. I am not able to view MRI but xray from ED visit showed medial compartment OA with osteophytosis. -PT referral placed to [name] at [facility] at [place] -record request sent for [facility] notes and MRI -increase tylenol to 1000 mg q 6 hrs -oxycodone 5 mg at bedtime, #30 tabs sent I talked with patient about expectation to take medication as written. I hope that this will be a short term course until she is able to get knee surgery but also set expectation that we would need to complete patient opioid agreement at follow-up to continue prescribing.

## 2023-03-04 NOTE — Telephone Encounter (Signed)
Decision:Denied Romeo Rabon (Key: Hal Hope) Reginal Lutes 0.25MG /0.5ML auto-injectors Form [employer] Medicare Part D Electronic Prior Authorization Form (2017 NCPDP) Created Message from Plan This coverage request has been reviewed and denied by [employer] Prior Authorization Department on 03/02/2023 12:10 PM CST. Faxed [employer] to Appeals at [phone].   FYI I'm still waiting on denial information to be faxed to [place].

## 2023-03-09 ENCOUNTER — Ambulatory Visit: Payer: 59 | Admitting: *Deleted

## 2023-03-09 DIAGNOSIS — L501 Idiopathic urticaria: Secondary | ICD-10-CM

## 2023-03-10 NOTE — Progress Notes (Signed)
Internal Medicine Clinic Attending  Case discussed with the resident at the time of the visit.  We reviewed the resident's history and exam and pertinent patient test results.  I agree with the assessment, diagnosis, and plan of care documented in the resident's note.  

## 2023-03-15 ENCOUNTER — Encounter: Payer: 59 | Admitting: Dietician

## 2023-03-21 ENCOUNTER — Encounter (HOSPITAL_BASED_OUTPATIENT_CLINIC_OR_DEPARTMENT_OTHER): Payer: Self-pay | Admitting: Orthopedic Surgery

## 2023-03-21 ENCOUNTER — Other Ambulatory Visit: Payer: Self-pay

## 2023-03-21 NOTE — Progress Notes (Signed)
   03/21/23 1420  Pre-op Phone Call  Surgery Date Verified 03/29/23  Arrival Time Verified 0815  Surgery Location Verified [facility] [place]  Medical History Reviewed Yes  Is the patient taking a GLP-1 receptor agonist? No (not taking)  Does the patient have diabetes? No diagnosis of diabetes  Do you have a history of heart problems? Yes  Cardiologist Name Dr. Mayford Knife  Have you ever had tests on your heart? No  Does patient have other implanted devices? No  Patient Teaching Enhanced Recovery;Pre / Post Procedure  Patient educated about smoking cessation 24 hours prior to surgery. N/A Non-Smoker  Patient verbalizes understanding of bowel prep? N/A  THA/TKA patients only:  By your surgery date, will you have been taking narcotics for 90 days or greater? No  Med Rec Completed Yes  Take the Following Meds the Morning of Surgery protonix  Recent  Lab Work, EKG, CXR? No  NPO (Including gum & candy) After midnight  Allowed clear liquids Water;Gatorade  (diabetics please choose diet or no sugar options)  Patient instructed to stop clear liquids including Carb loading drink at: 0700  Stop Solids, Milk, Candy, and Gum STARTING AT MIDNIGHT  Responsible adult to drive and be with you for 24 hours? Yes  Name & Phone Number for Ride/Caregiver sister cathy  No Jewelry, money, nail polish or make-up.  No lotions, powders, perfumes. No shaving  48 hrs. prior to surgery. Yes  Contacts, Dentures & Glasses Will Have to be Removed Before OR. Yes  Please bring your ID and Insurance Card the morning of your surgery. (Surgery Centers Only) Yes  Bring any papers or x-rays with you that your surgeon gave you. Yes  Instructed to contact the location of procedure/ provider if they or anyone in their household develops symptoms or tests positive for COVID-19, has close contact with someone who tests positive for COVID, or has known exposure to any contagious illness. Yes  Call this number the morning of surgery  with any  problems that may cancel your surgery. [phone]  Covid-19 Assessment  Have you had a positive COVID-19 test within the previous 90 days? No  COVID Testing Guidance Proceed with the additional questions.  Patient's surgery required a COVID-19 test (cardiothoracic, complex ENT, and bronchoscopies/ EBUS) No  Have you been unmasked and in close contact with anyone with COVID-19 or COVID-19 symptoms within the past 10 days? No  Do you or anyone in your household currently have any COVID-19 symptoms? No   Patient coming in for Anesthesia consult before surgery

## 2023-03-21 NOTE — H&P (Signed)
PREOPERATIVE H&P  Chief Complaint: RIGHT KNEE MEDIAL MENISCUS TEAR  HPI: Maria Nelson is a 67 y.o. female who presents with a diagnosis of RIGHT KNEE MEDIAL MENISCUS TEAR. Symptoms are rated as moderate to severe, and have been worsening.  This is significantly impairing activities of daily living.  She has elected for surgical management.   Past Medical History:  Diagnosis Date   Adnexal pain 09/06/2018   ANGIOEDEMA 12/16/2009   Asthma    Blood pressure elevated without history of HTN 03/05/2014   Carpal tunnel syndrome 06/23/2011   Patient notes history of bilateral carpal tunnel syndrome.  Now has symptoms of right hand carpal tunnel.    COPD (chronic obstructive pulmonary disease) (HCC)    secondary to tobacco use // No PFTs on file   COVID-19    Dyspepsia 08/24/2017   Emphysema of lung (HCC)    not on O2 at home (09/08/2022)   ETOH abuse    Pt stopped in 3/08   Gastrointestinal hemorrhage    secondary to PUD on March 2008   GERD (gastroesophageal reflux disease)    on meds   Low iron 12/03/2021   OSA on CPAP    noncompliant with CPAP (2/2 it being depressing)   Other fatigue 07/06/2022   Peptic ulcer disease    +H. pylori (antigen)- Dr. Juanda Chance- treated   STRESS INCONTINENCE 01/24/2007   Tobacco abuse    quit in 2010   Urinary incontinence    Urticaria    Urticaria 07/01/2015   Past Surgical History:  Procedure Laterality Date   BREAST EXCISIONAL BIOPSY Right 1994   COLONOSCOPY     MANDIBLE SURGERY     due to trauma   TUBAL LIGATION     Social History   Socioeconomic History   Marital status: Single    Spouse name: Not on file   Number of children: 3   Years of education: 12th grade   Highest education level: Not on file  Occupational History   Occupation: Disability    Comment: previously worked in [employer] had to stop 2/2 occupational exposures leading to exacerbations of asthma   Tobacco Use   Smoking status: Former    Current  packs/day: 0.00    Average packs/day: 0.5 packs/day for 40.0 years (20.0 ttl pk-yrs)    Types: Cigarettes    Start date: 12/04/1970    Quit date: 12/04/2010    Years since quitting: 12.3   Smokeless tobacco: Never  Vaping Use   Vaping status: Never Used  Substance and Sexual Activity   Alcohol use: Not Currently   Drug use: No   Sexual activity: Not Currently  Other Topics Concern   Not on file  Social History Narrative   Pt is not working now, was previously a Conservation officer, nature at [employer].      Pt lives with her son      Pt has received financial assistance approval for 100% discount at [facility] and has [facility] card.    Social Determinants of Health   Financial Resource Strain: Low Risk  (09/22/2022)   Overall Financial Resource Strain (CARDIA)    Difficulty of Paying Living Expenses: Not hard at all  Food Insecurity: No Food Insecurity (09/22/2022)   Hunger Vital Sign    Worried About Running Out of Food in the Last Year: Never true    Ran Out of Food in the Last Year: Never true  Transportation Needs: No Transportation Needs (09/22/2022)   PRAPARE -  Administrator, Civil Service (Medical): No    Lack of Transportation (Non-Medical): No  Physical Activity: Sufficiently Active (09/22/2022)   Exercise Vital Sign    Days of Exercise per Week: 3 days    Minutes of Exercise per Session: 60 min  Stress: No Stress Concern Present (09/22/2022)   [employer] of Occupational Health - Occupational Stress Questionnaire    Feeling of Stress : Not at all  Social Connections: Moderately Isolated (09/22/2022)   Social Connection and Isolation Panel [NHANES]    Frequency of Communication with Friends and Family: More than three times a week    Frequency of Social Gatherings with Friends and Family: Twice a week    Attends Religious Services: 1 to 4 times per year    Active Member of [employer] or Organizations: No    Attends Engineer, structural: Never    Marital Status:  Divorced   Family History  Problem Relation Age of Onset   Diabetes Mother    Coronary artery disease Mother 45       requiring 5 vessel CABG   Diabetes Sister    Breast cancer Sister 50       died 2/2 breast ca age 17-60   Colon polyps Neg Hx    Colon cancer Neg Hx    Esophageal cancer Neg Hx    Rectal cancer Neg Hx    Stomach cancer Neg Hx    Allergies  Allergen Reactions   Bee Venom Anaphylaxis   Aspirin Nausea Only   Other Rash    EKG pads cause burning sensation as soon as applied   Prior to Admission medications   Medication Sig Start Date End Date Taking? Authorizing Provider  pantoprazole (PROTONIX) 40 MG tablet Take 1 tablet by mouth once daily 01/17/23  Yes Dickie La, MD  acetaminophen (TYLENOL) 500 MG tablet Take 2 tablets (1,000 mg total) by mouth every 6 (six) hours as needed. 02/18/23   Lovie Macadamia, MD  albuterol (PROVENTIL) (2.5 MG/3ML) 0.083% nebulizer solution Use 1 vial in nebulizer up to 3 times a day when albuterol inhaler not working. If no better or getting worse, call 911 or physician line. 07/05/22   Dickie La, MD  albuterol (VENTOLIN HFA) 108 (90 Base) MCG/ACT inhaler Inhale 2 puffs into the lungs every 6 (six) hours as needed for wheezing or shortness of breath. 11/02/21   [name], MD  atorvastatin (LIPITOR) 20 MG tablet Take 1 tablet (20 mg total) by mouth daily. 10/13/22 01/11/23  Quintella Reichert, MD  BREZTRI AEROSPHERE 160-9-4.8 MCG/ACT AERO INHALE 2 PUFFS INTO THE LUNGS TWICE DAILY 12/13/22   [name], MD  clotrimazole (LOTRIMIN) 1 % cream Apply to right foot and between right toes twice daily for two weeks. 07/06/22   Dickie La, MD  EPINEPHrine (EPIPEN 2-PAK) 0.3 mg/0.3 mL IJ SOAJ injection Inject 0.3 mg into the muscle as needed (for allergic reaction). 11/02/21   [name], MD  Na Sulfate-K Sulfate-Mg Sulf 17.5-3.13-1.6 GM/177ML SOLN See admin instructions. 09/08/22   [provider]  oxyCODONE (ROXICODONE) 5 MG immediate  release tablet Take 1 tablet (5 mg total) by mouth at bedtime as needed. 03/02/23 03/01/24  Masters, Florentina Addison, DO  Semaglutide-Weight Management 0.25 MG/0.5ML SOAJ Inject 0.25 mg into the skin once a week for 28 days. 03/02/23 03/30/23  Masters, Katie, DO  Semaglutide-Weight Management 0.5 MG/0.5ML SOAJ Inject 0.5 mg into the skin once a week for 28 days. 03/31/23  04/28/23  Masters, Florentina Addison, DO  Semaglutide-Weight Management 1 MG/0.5ML SOAJ Inject 1 mg into the skin once a week for 28 days. 04/29/23 05/27/23  Masters, Florentina Addison, DO  Semaglutide-Weight Management 1.7 MG/0.75ML SOAJ Inject 1.7 mg into the skin once a week for 28 days. 05/28/23 06/25/23  Masters, Florentina Addison, DO  Semaglutide-Weight Management 2.4 MG/0.75ML SOAJ Inject 2.4 mg into the skin once a week for 28 days. 06/26/23 07/24/23  Masters, Katie, DO  Vitamin D, Cholecalciferol, 25 MCG (1000 UT) CAPS Take 1 capsule by mouth daily. 07/08/22   Dickie La, MD  Geoffry Paradise 150 MG/ML prefilled syringe Inject 150 mg into the skin every 28 (twenty-eight) days. 09/02/22   [provider]     Positive ROS: All other systems have been reviewed and were otherwise negative with the exception of those mentioned in the HPI and as above.  Physical Exam: General: Alert, no acute distress Cardiovascular: No pedal edema Respiratory: No cyanosis, no use of accessory musculature GI: No organomegaly, abdomen is soft and non-tender Skin: No lesions in the area of chief complaint Neurologic: Sensation intact distally Psychiatric: Patient is competent for consent with normal mood and affect Lymphatic: No axillary or cervical lymphadenopathy  MUSCULOSKELETAL: TTP medial joint line, painful ROM, decreased strength, NVI   Imaging: MRI shows small radial tear along the free edge of the body of the medial meniscus with extrusion  and severe OA   Assessment: RIGHT KNEE MEDIAL MENISCUS TEAR  Plan: Plan for Procedure(s): KNEE ARTHROSCOPY WITH MEDIAL MENISECTOMY AND  INJECTION  The risks benefits and alternatives were discussed with the patient including but not limited to the risks of nonoperative treatment, versus surgical intervention including infection, bleeding, nerve injury,  blood clots, cardiopulmonary complications, morbidity, mortality, among others, and they were willing to proceed.   Weightbearing: WBAT RLE Orthopedic devices: none Showering: POD 3 Dressing: reinforce PRN Medicines: ASA, Norco, Zofran; no NSAIDs  Discharge: home Follow up: 04/08/23 at 11:30am    Marzetta Board Office [phone] 03/21/2023 5:57 PM

## 2023-03-22 ENCOUNTER — Ambulatory Visit: Payer: 59 | Admitting: Dietician

## 2023-03-22 ENCOUNTER — Encounter (HOSPITAL_BASED_OUTPATIENT_CLINIC_OR_DEPARTMENT_OTHER)
Admission: RE | Admit: 2023-03-22 | Discharge: 2023-03-22 | Disposition: A | Discharge status: Home / Self Care | Payer: 59 | Source: Ambulatory Visit | Attending: Orthopedic Surgery | Admitting: Orthopedic Surgery

## 2023-03-22 DIAGNOSIS — Z01810 Encounter for preprocedural cardiovascular examination: Secondary | ICD-10-CM | POA: Insufficient documentation

## 2023-03-22 NOTE — Progress Notes (Signed)
BMI updated- 47.34

## 2023-03-22 NOTE — Patient Instructions (Addendum)
[name],   Look in your cart at the grocery and then in your refrigerator does it have in it what you want/ need to eat?   Recommendation is for 1% milk. You can start by using 2% and work your way to 1%. Limit to 2 8 ounces glasses.  Find a whole grain/high fiber bread Seafood is great- just not fried Whole grains will feed your gut and make it make its own "wegovy" hormone all dried beans are great for your gut, too - pintos, navy, black, 15 beans  Breakfast: scrambled egg, toast 1, 8 oz 1-2% milk Lunch: a dairy serving - [employer]- x2 snacks- try to buy a lower sugar one  :          could add a vegetable or a fruit like your applesauce (recommend no sugar added)  Dinner: 2 starch and a protein: sandwich- tunafish, chicken salad, 1-2% milk  could add a vegetable   Consider 2 prunes a day- after dinner may help easy BM the next day. They also feed your gut important vitamins and fiber.  Keep doing those exercise you have been doing.   Call me anytime!!!  [name] [phone]

## 2023-03-22 NOTE — Progress Notes (Signed)

## 2023-03-22 NOTE — Progress Notes (Signed)
Nutrition counseling: she and her orthopedic doctor want her to decrease weight  Periodically needs prednisone which makes her gain weight. She met with me in 2017 for weight loss. She reports and data verify gradual 10# weight loss over past several years by reducing portions.  Discussed additional ideas to help reduce her calorie intake to continue with gradual weight loss.   Estimated body mass index is 50.13 kg/m as calculated from the following:   Height as of 03/21/23: 5' (1.524 m).   Weight as of this encounter: 256 lb 11.2 oz (116.4 kg). Wt Readings from Last 20 Encounters:  03/22/23 256 lb 11.2 oz (116.4 kg)  03/02/23 260 lb 11.2 oz (118.3 kg)  02/21/23 257 lb (116.6 kg)  01/26/23 255 lb 6.4 oz (115.8 kg)  01/06/23 253 lb (114.8 kg)  10/13/22 253 lb (114.8 kg)  10/04/22 256 lb 3.2 oz (116.2 kg)  09/29/22 252 lb (114.3 kg)  09/22/22 255 lb (115.7 kg)  09/08/22 255 lb (115.7 kg)  08/25/22 255 lb 6.4 oz (115.8 kg)  07/26/22 256 lb 11.2 oz (116.4 kg)  07/05/22 256 lb 3.2 oz (116.2 kg)  05/03/22 259 lb 11.2 oz (117.8 kg)  03/15/22 264 lb 8 oz (120 kg)  12/28/21 265 lb 3.2 oz (120.3 kg)  12/03/21 266 lb 4.8 oz (120.8 kg)  11/26/21 286 lb 9.6 oz (130 kg)  11/02/21 263 lb 6.4 oz (119.5 kg)  09/02/21 263 lb 9.6 oz (119.6 kg)   Norm Parcel, RD 03/22/2023 10:27 AM.

## 2023-03-29 ENCOUNTER — Ambulatory Visit (HOSPITAL_BASED_OUTPATIENT_CLINIC_OR_DEPARTMENT_OTHER): Payer: 59 | Admitting: Anesthesiology

## 2023-03-29 ENCOUNTER — Encounter (HOSPITAL_BASED_OUTPATIENT_CLINIC_OR_DEPARTMENT_OTHER)
Admission: RE | Disposition: A | Discharge status: Home / Self Care | Payer: Self-pay | Source: Ambulatory Visit | Attending: Orthopedic Surgery

## 2023-03-29 ENCOUNTER — Other Ambulatory Visit: Payer: Self-pay

## 2023-03-29 ENCOUNTER — Encounter: Payer: 59 | Admitting: Internal Medicine

## 2023-03-29 ENCOUNTER — Encounter (HOSPITAL_BASED_OUTPATIENT_CLINIC_OR_DEPARTMENT_OTHER): Payer: Self-pay | Admitting: Orthopedic Surgery

## 2023-03-29 ENCOUNTER — Ambulatory Visit (HOSPITAL_BASED_OUTPATIENT_CLINIC_OR_DEPARTMENT_OTHER)
Admission: RE | Admit: 2023-03-29 | Discharge: 2023-03-29 | Disposition: A | Discharge status: Home / Self Care | Payer: 59 | Source: Ambulatory Visit | Attending: Orthopedic Surgery | Admitting: Orthopedic Surgery

## 2023-03-29 DIAGNOSIS — S83231A Complex tear of medial meniscus, current injury, right knee, initial encounter: Secondary | ICD-10-CM | POA: Insufficient documentation

## 2023-03-29 DIAGNOSIS — N183 Chronic kidney disease, stage 3 unspecified: Secondary | ICD-10-CM | POA: Diagnosis not present

## 2023-03-29 DIAGNOSIS — Z6841 Body Mass Index (BMI) 40.0 and over, adult: Secondary | ICD-10-CM | POA: Insufficient documentation

## 2023-03-29 DIAGNOSIS — M1711 Unilateral primary osteoarthritis, right knee: Secondary | ICD-10-CM | POA: Diagnosis not present

## 2023-03-29 DIAGNOSIS — G4733 Obstructive sleep apnea (adult) (pediatric): Secondary | ICD-10-CM | POA: Diagnosis not present

## 2023-03-29 DIAGNOSIS — K219 Gastro-esophageal reflux disease without esophagitis: Secondary | ICD-10-CM | POA: Diagnosis not present

## 2023-03-29 DIAGNOSIS — Z87891 Personal history of nicotine dependence: Secondary | ICD-10-CM | POA: Insufficient documentation

## 2023-03-29 DIAGNOSIS — S83271A Complex tear of lateral meniscus, current injury, right knee, initial encounter: Secondary | ICD-10-CM | POA: Diagnosis not present

## 2023-03-29 DIAGNOSIS — I480 Paroxysmal atrial fibrillation: Secondary | ICD-10-CM | POA: Diagnosis not present

## 2023-03-29 DIAGNOSIS — Z8711 Personal history of peptic ulcer disease: Secondary | ICD-10-CM | POA: Insufficient documentation

## 2023-03-29 DIAGNOSIS — M2341 Loose body in knee, right knee: Secondary | ICD-10-CM | POA: Diagnosis not present

## 2023-03-29 DIAGNOSIS — J4489 Other specified chronic obstructive pulmonary disease: Secondary | ICD-10-CM | POA: Diagnosis not present

## 2023-03-29 DIAGNOSIS — S83241D Other tear of medial meniscus, current injury, right knee, subsequent encounter: Secondary | ICD-10-CM

## 2023-03-29 DIAGNOSIS — I129 Hypertensive chronic kidney disease with stage 1 through stage 4 chronic kidney disease, or unspecified chronic kidney disease: Secondary | ICD-10-CM | POA: Diagnosis not present

## 2023-03-29 DIAGNOSIS — S838X1A Sprain of other specified parts of right knee, initial encounter: Secondary | ICD-10-CM

## 2023-03-29 DIAGNOSIS — S83241A Other tear of medial meniscus, current injury, right knee, initial encounter: Secondary | ICD-10-CM | POA: Diagnosis not present

## 2023-03-29 DIAGNOSIS — Z01818 Encounter for other preprocedural examination: Secondary | ICD-10-CM

## 2023-03-29 HISTORY — PX: KNEE ARTHROSCOPY WITH MEDIAL MENISECTOMY: SHX5651

## 2023-03-29 SURGERY — ARTHROSCOPY, KNEE, WITH MEDIAL MENISCECTOMY
Anesthesia: General | Site: Knee | Laterality: Right

## 2023-03-29 MED ORDER — FENTANYL CITRATE (PF) 100 MCG/2ML IJ SOLN
INTRAMUSCULAR | Status: AC
  Filled 2023-03-29: qty 2

## 2023-03-29 MED ORDER — OXYCODONE HCL 5 MG PO TABS
ORAL_TABLET | ORAL | Status: AC
  Filled 2023-03-29: qty 1

## 2023-03-29 MED ORDER — LACTATED RINGERS IV SOLN: INTRAVENOUS | Status: DC

## 2023-03-29 MED ORDER — SODIUM CHLORIDE 0.9 % IR SOLN
Status: DC | PRN
  Administered 2023-03-29: 2000 mL

## 2023-03-29 MED ORDER — ASPIRIN 81 MG PO TBEC
81.0000 mg | DELAYED_RELEASE_TABLET | Freq: Two times a day (BID) | ORAL | 0 refills | Status: DC
Start: 2023-03-29 — End: 2023-05-03

## 2023-03-29 MED ORDER — POVIDONE-IODINE 10 % EX SWAB: 2.0000 | Freq: Once | CUTANEOUS | Status: DC

## 2023-03-29 MED ORDER — PROPOFOL 10 MG/ML IV BOLUS
INTRAVENOUS | Status: DC | PRN
  Administered 2023-03-29: 200 mg via INTRAVENOUS

## 2023-03-29 MED ORDER — ONDANSETRON HCL 4 MG/2ML IJ SOLN
INTRAMUSCULAR | Status: DC | PRN
  Administered 2023-03-29: 4 mg via INTRAVENOUS

## 2023-03-29 MED ORDER — ACETAMINOPHEN 500 MG PO TABS
1000.0000 mg | ORAL_TABLET | Freq: Once | ORAL | Status: AC
  Administered 2023-03-29: 1000 mg via ORAL

## 2023-03-29 MED ORDER — CEFAZOLIN SODIUM-DEXTROSE 2-4 GM/100ML-% IV SOLN
2.0000 g | INTRAVENOUS | Status: AC
  Administered 2023-03-29: 2 g via INTRAVENOUS

## 2023-03-29 MED ORDER — ACETAMINOPHEN 500 MG PO TABS
ORAL_TABLET | ORAL | Status: AC
  Filled 2023-03-29: qty 2

## 2023-03-29 MED ORDER — HYDROCODONE-ACETAMINOPHEN 10-325 MG PO TABS: 1.0000 | ORAL_TABLET | Freq: Four times a day (QID) | ORAL | 0 refills | Status: DC | PRN

## 2023-03-29 MED ORDER — MIDAZOLAM HCL 2 MG/2ML IJ SOLN
INTRAMUSCULAR | Status: AC
  Filled 2023-03-29: qty 2

## 2023-03-29 MED ORDER — DEXAMETHASONE SODIUM PHOSPHATE 10 MG/ML IJ SOLN: 8.0000 mg | Freq: Once | INTRAMUSCULAR | Status: DC

## 2023-03-29 MED ORDER — OXYCODONE HCL 5 MG/5ML PO SOLN: 5.0000 mg | Freq: Once | ORAL | Status: AC | PRN

## 2023-03-29 MED ORDER — DEXAMETHASONE SODIUM PHOSPHATE 4 MG/ML IJ SOLN
INTRAMUSCULAR | Status: DC | PRN
  Administered 2023-03-29: 5 mg via INTRAVENOUS

## 2023-03-29 MED ORDER — DEXAMETHASONE SODIUM PHOSPHATE 10 MG/ML IJ SOLN
INTRAMUSCULAR | Status: AC
Start: 2023-03-29 — End: ?
  Filled 2023-03-29: qty 1

## 2023-03-29 MED ORDER — METHYLPREDNISOLONE ACETATE 80 MG/ML IJ SUSP
INTRAMUSCULAR | Status: DC | PRN
  Administered 2023-03-29: 5 mL via INTRA_ARTICULAR

## 2023-03-29 MED ORDER — LIDOCAINE HCL (CARDIAC) PF 100 MG/5ML IV SOSY
PREFILLED_SYRINGE | INTRAVENOUS | Status: DC | PRN
  Administered 2023-03-29: 60 mg via INTRAVENOUS

## 2023-03-29 MED ORDER — SODIUM CHLORIDE 0.9 % IV SOLN: INTRAVENOUS | Status: DC | PRN

## 2023-03-29 MED ORDER — METHYLPREDNISOLONE ACETATE 80 MG/ML IJ SUSP
INTRAMUSCULAR | Status: AC
  Filled 2023-03-29: qty 2

## 2023-03-29 MED ORDER — CEFAZOLIN SODIUM-DEXTROSE 2-4 GM/100ML-% IV SOLN
INTRAVENOUS | Status: AC
Start: 2023-03-29 — End: ?
  Filled 2023-03-29: qty 100

## 2023-03-29 MED ORDER — FENTANYL CITRATE (PF) 100 MCG/2ML IJ SOLN
25.0000 ug | INTRAMUSCULAR | Status: DC | PRN
  Administered 2023-03-29 (×3): 50 ug via INTRAVENOUS

## 2023-03-29 MED ORDER — FENTANYL CITRATE (PF) 100 MCG/2ML IJ SOLN
INTRAMUSCULAR | Status: DC | PRN
  Administered 2023-03-29: 50 ug via INTRAVENOUS

## 2023-03-29 MED ORDER — ONDANSETRON 4 MG PO TBDP: 4.0000 mg | ORAL_TABLET | Freq: Three times a day (TID) | ORAL | 0 refills | Status: DC | PRN

## 2023-03-29 MED ORDER — ONDANSETRON HCL 4 MG/2ML IJ SOLN: 4.0000 mg | Freq: Once | INTRAMUSCULAR | Status: DC | PRN

## 2023-03-29 MED ORDER — OXYCODONE HCL 5 MG PO TABS
5.0000 mg | ORAL_TABLET | Freq: Once | ORAL | Status: AC | PRN
Start: 2023-03-29 — End: 2023-03-29
  Administered 2023-03-29: 5 mg via ORAL

## 2023-03-29 SURGICAL SUPPLY — 32 items
BNDG ELASTIC 6INX 5YD STR LF (GAUZE/BANDAGES/DRESSINGS) ×1 IMPLANT
CHLORAPREP W/TINT 26 (MISCELLANEOUS) ×1 IMPLANT
CUFF TRNQT CYL 34X4.125X (TOURNIQUET CUFF) ×1 IMPLANT
DISSECTOR 3.8MM X 13CM (MISCELLANEOUS) ×1 IMPLANT
DISSECTOR 4.0MM X 13CM (MISCELLANEOUS) IMPLANT
DRAPE ARTHROSCOPY W/POUCH 90 (DRAPES) ×1 IMPLANT
DRAPE IMP U-DRAPE 54X76 (DRAPES) ×1 IMPLANT
DRAPE U-SHAPE 47X51 STRL (DRAPES) ×1 IMPLANT
DRSG EMULSION OIL 3X3 NADH (GAUZE/BANDAGES/DRESSINGS) ×1 IMPLANT
EXCALIBUR 3.8MM X 13CM (MISCELLANEOUS) IMPLANT
GAUZE PAD ABD 8X10 STRL (GAUZE/BANDAGES/DRESSINGS) ×2 IMPLANT
GAUZE SPONGE 4X4 12PLY STRL (GAUZE/BANDAGES/DRESSINGS) ×1 IMPLANT
GLOVE BIO SURGEON STRL SZ7.5 (GLOVE) ×1 IMPLANT
GLOVE BIOGEL PI IND STRL 7.5 (GLOVE) ×2 IMPLANT
GLOVE BIOGEL PI IND STRL 8 (GLOVE) ×1 IMPLANT
GLOVE BIOGEL PI IND STRL 8.5 (GLOVE) IMPLANT
GLOVE SURG SYN 7.5 E (GLOVE) ×1
GLOVE SURG SYN 7.5 PF PI (GLOVE) ×1 IMPLANT
GOWN STRL REUS W/ TWL LRG LVL3 (GOWN DISPOSABLE) ×1 IMPLANT
GOWN STRL REUS W/ TWL XL LVL3 (GOWN DISPOSABLE) ×1 IMPLANT
MANIFOLD NEPTUNE II (INSTRUMENTS) ×1 IMPLANT
PACK ARTHROSCOPY DSU (CUSTOM PROCEDURE TRAY) ×1 IMPLANT
PACK BASIN DAY SURGERY FS (CUSTOM PROCEDURE TRAY) ×1 IMPLANT
PACK ICE MAXI GEL EZY WRAP (MISCELLANEOUS) ×1 IMPLANT
SLEEVE SCD COMPRESS KNEE MED (STOCKING) ×1 IMPLANT
SUT ETHILON 3 0 PS 1 (SUTURE) ×1 IMPLANT
TAPE CLOTH 3X10 TAN LF (GAUZE/BANDAGES/DRESSINGS) IMPLANT
TOWEL GREEN STERILE FF (TOWEL DISPOSABLE) ×1 IMPLANT
TUBING ARTHROSCOPY IRRIG 16FT (MISCELLANEOUS) ×1 IMPLANT
WAND ABLATOR APOLLO I90 (BUR) IMPLANT
WATER STERILE IRR 1000ML POUR (IV SOLUTION) ×1 IMPLANT
WRAP KNEE MAXI GEL POST OP (GAUZE/BANDAGES/DRESSINGS) ×1 IMPLANT

## 2023-03-29 NOTE — Interval H&P Note (Signed)
History and Physical Interval Note:  03/29/2023 9:06 AM  Maria Nelson  has presented today for surgery, with the diagnosis of RIGHT KNEE MEDIAL MENISCUS TEAR.  The various methods of treatment have been discussed with the patient and family. After consideration of risks, benefits and other options for treatment, the patient has consented to  Procedure(s): KNEE ARTHROSCOPY WITH MEDIAL MENISECTOMY AND INJECTION (Right) as a surgical intervention.  The patient's history has been reviewed, patient examined, no change in status, stable for surgery.  I have reviewed the patient's chart and labs.  Questions were answered to the patient's satisfaction.     Sheral Apley

## 2023-03-29 NOTE — Discharge Instructions (Addendum)
POST-OPERATIVE OPIOID TAPER INSTRUCTIONS: It is important to wean off of your opioid medication as soon as possible. If you do not need pain medication after your surgery it is ok to stop day one. Opioids include: Codeine, Hydrocodone(Norco, Vicodin), Oxycodone(Percocet, oxycontin) and hydromorphone amongst others.  Long term and even short term use of opiods can cause: Increased pain response Dependence Constipation Depression Respiratory depression And more.  Withdrawal symptoms can include Flu like symptoms Nausea, vomiting And more Techniques to manage these symptoms Hydrate well Eat regular healthy meals Stay active Use relaxation techniques(deep breathing, meditating, yoga) Do Not substitute Alcohol to help with tapering If you have been on opioids for less than two weeks and do not have pain than it is ok to stop all together.  Plan to wean off of opioids This plan should start within one week post op of your joint replacement. Maintain the same interval or time between taking each dose and first decrease the dose.  Cut the total daily intake of opioids by one tablet each day Next start to increase the time between doses. The last dose that should be eliminated is the evening dose.      Post Anesthesia Home Care Instructions  Activity: Get plenty of rest for the remainder of the day. A responsible individual must stay with you for 24 hours following the procedure.  For the next 24 hours, DO NOT: -Drive a car -Advertising copywriter -Drink alcoholic beverages -Take any medication unless instructed by your physician -Make any legal decisions or sign important papers.  Meals: Start with liquid foods such as gelatin or soup. Progress to regular foods as tolerated. Avoid greasy, spicy, heavy foods. If nausea and/or vomiting occur, drink only clear liquids until the nausea and/or vomiting subsides. Call your physician if vomiting continues.  Special  Instructions/Symptoms: Your throat may feel dry or sore from the anesthesia or the breathing tube placed in your throat during surgery. If this causes discomfort, gargle with warm salt water. The discomfort should disappear within 24 hours.  If you had a scopolamine patch placed behind your ear for the management of post- operative nausea and/or vomiting:  1. The medication in the patch is effective for 72 hours, after which it should be removed.  Wrap patch in a tissue and discard in the trash. Wash hands thoroughly with soap and water. 2. You may remove the patch earlier than 72 hours if you experience unpleasant side effects which may include dry mouth, dizziness or visual disturbances. 3. Avoid touching the patch. Wash your hands with soap and water after contact with the patch.   Next dose of Tylenol may be given at 2:25pm if needed.

## 2023-03-29 NOTE — Anesthesia Preprocedure Evaluation (Addendum)
Anesthesia Evaluation  Patient identified by MRN, date of birth, ID band Patient awake    Reviewed: Allergy & Precautions, NPO status , Patient's Chart, lab work & pertinent test results  History of Anesthesia Complications Negative for: history of anesthetic complications  Airway Mallampati: I  TM Distance: >3 FB Neck ROM: Full    Dental  (+) Edentulous Upper, Edentulous Lower   Pulmonary asthma , sleep apnea , COPD,  COPD inhaler, former smoker   Pulmonary exam normal        Cardiovascular Normal cardiovascular exam+ dysrhythmias Atrial Fibrillation      Neuro/Psych negative neurological ROS  negative psych ROS   GI/Hepatic PUD,GERD  Medicated and Controlled,,(+)     substance abuse  alcohol use  Endo/Other    Class 4 obesity  Renal/GU CRFRenal disease  Female GU complaint     Musculoskeletal negative musculoskeletal ROS (+)    Abdominal  (+) + obese  Peds  Hematology negative hematology ROS (+)   Anesthesia Other Findings On GLP-1a   Reproductive/Obstetrics                             Anesthesia Physical Anesthesia Plan  ASA: 3  Anesthesia Plan: General   Post-op Pain Management: Tylenol PO (pre-op)*   Induction: Intravenous  PONV Risk Score and Plan: 3 and Treatment may vary due to age or medical condition, Ondansetron, Dexamethasone and Midazolam  Airway Management Planned: LMA  Additional Equipment: None  Intra-op Plan:   Post-operative Plan: Extubation in OR  Informed Consent: I have reviewed the patients History and Physical, chart, labs and discussed the procedure including the risks, benefits and alternatives for the proposed anesthesia with the patient or authorized representative who has indicated his/her understanding and acceptance.     Dental advisory given  Plan Discussed with: CRNA and Anesthesiologist  Anesthesia Plan Comments:         Anesthesia Quick Evaluation

## 2023-03-29 NOTE — Op Note (Signed)
03/29/2023  10:22 AM  PATIENT:  Maria Nelson    PRE-OPERATIVE DIAGNOSIS:  RIGHT KNEE MEDIAL MENISCUS TEAR  POST-OPERATIVE DIAGNOSIS:  Same  PROCEDURE:  KNEE ARTHROSCOPY WITH MEDIAL MENISECTOMY AND INJECTION  SURGEON:  Sheral Apley, MD  ASSISTANT: Levester Fresh, PA-C, he was present and scrubbed throughout the case, critical for completion in a timely fashion, and for retraction, instrumentation, and closure.   ANESTHESIA:   General  BLOOD LOSS: min  COMPLICATIONS: None   PREOPERATIVE INDICATIONS:  Maria Nelson is a  67 y.o. female with a diagnosis of RIGHT KNEE MEDIAL MENISCUS TEAR who failed conservative measures and elected for surgical management.    The risks benefits and alternatives were discussed with the patient preoperatively including but not limited to the risks of infection, bleeding, nerve injury, cardiopulmonary complications, the need for revision surgery, among others, and the patient was willing to proceed.  OPERATIVE IMPLANTS: none  OPERATIVE FINDINGS: Examination under anesthesia: stable Diagnostic Arthroscopy:  articular cartilage:grade 2/3 PF/ MFC Medial meniscus:complex tear Lateral meniscus:complex tear Anterior cruciate ligament/PCL: stable Loose bodies: medial large LB    OPERATIVE PROCEDURE:  Patient was identified in the preoperative holding area and site was marked by me female was transported to the operating theater and placed on the table in supine position taking care to pad all bony prominences. After a preincinduction time out anesthesia was induced.  female received ancef for preoperative antibiotics. The right lower extremity was prepped and draped in normal sterile fashion and a pre-incision timeout was performed.   A small stab incision was made in the anterolateral portal position. The arthroscope was introduced in the joint. A medial portal was then established under direct visualization just above  the anterior horn of the medial meniscus. Diagnostic arthroscopy was then carried out with findings as described above.  I performed a partial medial and lateral meniscectomy with a shaver.   I expanded the medial portal and removed a loose body with a grabber.   I performed a chondroplasty at the PF space and MFC with a shaver.   The arthroscopic equipment was removed from the joint and the portals were closed with 3-0 nylon in an interrupted fashion. The knee was infiltrated with marcaine and depo.  Sterile dressings were then applied including Xeroform 4 x 4's ABDs an ACE bandage.  The patient was then allowed to awaken from general anesthesia, transferred to the stretcher and taken to the recovery room in stable condition.  POSTOPERATIVE PLAN: The patient will be discharged home today and will followup in one week for suture removal and wound check.  VTE prophylaxis: ASA

## 2023-03-29 NOTE — Transfer of Care (Signed)
Immediate Anesthesia Transfer of Care Note  Patient: Maria Nelson  Procedure(s) Performed: KNEE ARTHROSCOPY WITH MEDIAL MENISECTOMY AND INJECTION (Right: Knee)  Patient Location: PACU  Anesthesia Type:General  Level of Consciousness: awake, alert , oriented, drowsy, and patient cooperative  Airway & Oxygen Therapy: Patient Spontanous Breathing and Patient connected to face mask oxygen  Post-op Assessment: Report given to RN and Post -op Vital signs reviewed and stable  Post vital signs: Reviewed and stable  Last Vitals:  Vitals Value Taken Time  BP    Temp    Pulse    Resp    SpO2      Last Pain:  Vitals:   03/29/23 0821  TempSrc: Temporal  PainSc: 0-No pain      Patients Stated Pain Goal: 3 (03/29/23 8295)  Complications: No notable events documented.

## 2023-03-29 NOTE — Anesthesia Procedure Notes (Signed)
Procedure Name: LMA Insertion Date/Time: 03/29/2023 9:38 AM  Performed by: Ronnette Hila, CRNAPre-anesthesia Checklist: Patient identified, Emergency Drugs available, Suction available and Patient being monitored Patient Re-evaluated:Patient Re-evaluated prior to induction Oxygen Delivery Method: Circle System Utilized Preoxygenation: Pre-oxygenation with 100% oxygen Induction Type: IV induction Ventilation: Mask ventilation without difficulty LMA: LMA inserted LMA Size: 4.0 Number of attempts: 1 Airway Equipment and Method: bite block Placement Confirmation: positive ETCO2 Tube secured with: Tape Dental Injury: Teeth and Oropharynx as per pre-operative assessment

## 2023-03-29 NOTE — Anesthesia Postprocedure Evaluation (Signed)
Anesthesia Post Note  Patient: Rubbie Lapointe  Procedure(s) Performed: KNEE ARTHROSCOPY WITH MEDIAL MENISECTOMY AND INJECTION (Right: Knee)     Patient location during evaluation: PACU Anesthesia Type: General Level of consciousness: awake and alert Pain management: pain level controlled Vital Signs Assessment: post-procedure vital signs reviewed and stable Respiratory status: spontaneous breathing, nonlabored ventilation and respiratory function stable Cardiovascular status: stable and blood pressure returned to baseline Anesthetic complications: no   No notable events documented.  Last Vitals:  Vitals:   03/29/23 1045 03/29/23 1056  BP: 126/85 128/78  Pulse: (!) 57 (!) 59  Resp: 12 17  Temp:    SpO2: 98% 94%    Last Pain:  Vitals:   03/29/23 1046  TempSrc:   PainSc: Asleep                 Beryle Lathe

## 2023-03-29 NOTE — Interval H&P Note (Signed)
History and Physical Interval Note:  03/29/2023 8:25 AM  Maria Nelson  has presented today for surgery, with the diagnosis of RIGHT KNEE MEDIAL MENISCUS TEAR.  The various methods of treatment have been discussed with the patient and family. After consideration of risks, benefits and other options for treatment, the patient has consented to  Procedure(s): KNEE ARTHROSCOPY WITH MEDIAL MENISECTOMY AND INJECTION (Right) as a surgical intervention.  The patient's history has been reviewed, patient examined, no change in status, stable for surgery.  I have reviewed the patient's chart and labs.  Questions were answered to the patient's satisfaction.     Sheral Apley

## 2023-03-30 ENCOUNTER — Encounter (HOSPITAL_BASED_OUTPATIENT_CLINIC_OR_DEPARTMENT_OTHER): Payer: Self-pay | Admitting: Orthopedic Surgery

## 2023-04-01 DIAGNOSIS — S83241D Other tear of medial meniscus, current injury, right knee, subsequent encounter: Secondary | ICD-10-CM | POA: Diagnosis not present

## 2023-04-01 DIAGNOSIS — S83281D Other tear of lateral meniscus, current injury, right knee, subsequent encounter: Secondary | ICD-10-CM | POA: Diagnosis not present

## 2023-04-02 ENCOUNTER — Other Ambulatory Visit: Payer: Self-pay | Admitting: Internal Medicine

## 2023-04-06 ENCOUNTER — Ambulatory Visit: Payer: 59 | Admitting: *Deleted

## 2023-04-06 DIAGNOSIS — L501 Idiopathic urticaria: Secondary | ICD-10-CM

## 2023-04-07 DIAGNOSIS — S83241D Other tear of medial meniscus, current injury, right knee, subsequent encounter: Secondary | ICD-10-CM | POA: Diagnosis not present

## 2023-04-07 DIAGNOSIS — S83281D Other tear of lateral meniscus, current injury, right knee, subsequent encounter: Secondary | ICD-10-CM | POA: Diagnosis not present

## 2023-04-08 DIAGNOSIS — S83281D Other tear of lateral meniscus, current injury, right knee, subsequent encounter: Secondary | ICD-10-CM | POA: Diagnosis not present

## 2023-04-08 DIAGNOSIS — S83241D Other tear of medial meniscus, current injury, right knee, subsequent encounter: Secondary | ICD-10-CM | POA: Diagnosis not present

## 2023-04-18 ENCOUNTER — Other Ambulatory Visit: Payer: Self-pay | Admitting: Family Medicine

## 2023-04-19 DIAGNOSIS — S83241D Other tear of medial meniscus, current injury, right knee, subsequent encounter: Secondary | ICD-10-CM | POA: Diagnosis not present

## 2023-04-19 DIAGNOSIS — S83281D Other tear of lateral meniscus, current injury, right knee, subsequent encounter: Secondary | ICD-10-CM | POA: Diagnosis not present

## 2023-04-30 ENCOUNTER — Other Ambulatory Visit: Payer: Self-pay | Admitting: Internal Medicine

## 2023-05-02 ENCOUNTER — Other Ambulatory Visit: Payer: Self-pay | Admitting: *Deleted

## 2023-05-02 MED ORDER — BREZTRI AEROSPHERE 160-9-4.8 MCG/ACT IN AERO: 2.0000 | INHALATION_SPRAY | Freq: Two times a day (BID) | RESPIRATORY_TRACT | 2 refills | Status: DC

## 2023-05-03 ENCOUNTER — Encounter: Payer: Self-pay | Admitting: Internal Medicine

## 2023-05-03 ENCOUNTER — Ambulatory Visit: Payer: 59 | Admitting: Internal Medicine

## 2023-05-03 VITALS — BP 122/76 | HR 65 | Temp 97.9°F | Ht 60.0 in | Wt 258.9 lb

## 2023-05-03 DIAGNOSIS — R748 Abnormal levels of other serum enzymes: Secondary | ICD-10-CM

## 2023-05-03 DIAGNOSIS — G4730 Sleep apnea, unspecified: Secondary | ICD-10-CM

## 2023-05-03 DIAGNOSIS — K219 Gastro-esophageal reflux disease without esophagitis: Secondary | ICD-10-CM

## 2023-05-03 DIAGNOSIS — J4489 Other specified chronic obstructive pulmonary disease: Secondary | ICD-10-CM

## 2023-05-03 DIAGNOSIS — I480 Paroxysmal atrial fibrillation: Secondary | ICD-10-CM

## 2023-05-03 DIAGNOSIS — N1831 Chronic kidney disease, stage 3a: Secondary | ICD-10-CM

## 2023-05-03 DIAGNOSIS — E559 Vitamin D deficiency, unspecified: Secondary | ICD-10-CM

## 2023-05-03 DIAGNOSIS — I700 Atherosclerosis of aorta: Secondary | ICD-10-CM

## 2023-05-03 DIAGNOSIS — Z6841 Body Mass Index (BMI) 40.0 and over, adult: Secondary | ICD-10-CM

## 2023-05-03 DIAGNOSIS — M25561 Pain in right knee: Secondary | ICD-10-CM

## 2023-05-03 DIAGNOSIS — E611 Iron deficiency: Secondary | ICD-10-CM

## 2023-05-03 DIAGNOSIS — E785 Hyperlipidemia, unspecified: Secondary | ICD-10-CM

## 2023-05-03 DIAGNOSIS — I272 Pulmonary hypertension, unspecified: Secondary | ICD-10-CM | POA: Diagnosis not present

## 2023-05-03 DIAGNOSIS — G4733 Obstructive sleep apnea (adult) (pediatric): Secondary | ICD-10-CM

## 2023-05-03 DIAGNOSIS — Z8711 Personal history of peptic ulcer disease: Secondary | ICD-10-CM

## 2023-05-03 MED ORDER — ALBUTEROL SULFATE (2.5 MG/3ML) 0.083% IN NEBU: INHALATION_SOLUTION | RESPIRATORY_TRACT | 3 refills | Status: DC

## 2023-05-03 NOTE — Assessment & Plan Note (Signed)
 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 Due for q32m BMP, annual ACR.  Plan -BMP, urine ACR, CBC

## 2023-05-03 NOTE — Assessment & Plan Note (Signed)
 Noted on prior imaging. Not currently on statin therapy as outlined elsewhere. Repeat lipid panel pending.  Plan -Lipid -Continue healthy lifestyle changes

## 2023-05-03 NOTE — Assessment & Plan Note (Signed)
 History of vitamin d deficiency. Taking 1000 international units  daily.    Plan -Recheck vit D -Continue 1000 international units  daily

## 2023-05-03 NOTE — Assessment & Plan Note (Addendum)
 Continued difficulty with weight, BMI > 50 today. Wegovy  denied at last visit. Alternative GLP-1 medications not covered on Medicare, same with Contrave. Phentermine products likely not a great option in someone who has had episodes of atrial fibrillation. She has been limited in terms of exercise until recently given knee pain, trying to get more active since this has improved post-surgery. Mostly doing home exercises as she is scared to walk in her neighborhood d/t stray dogs. Also making healthy dietary changes including adding beans, vegetables, and fruits.   Plan -Continue healthy dietary and activity changes -Referral to [facility], transportation may be a limiting factor

## 2023-05-03 NOTE — Assessment & Plan Note (Signed)
 ASCVD 5.9% with last lipid panel. Started on atorvastatin  20 mg daily by cardiology around June of last year. She notes that this caused her to have epigastric pain, similar to prior PUD, and she stopped taking this medication in August. Her symptoms have since resolved.  Plan -Recheck lipid panel

## 2023-05-03 NOTE — Assessment & Plan Note (Signed)
 History of atrial fibrillation during remote hospitalization. Followed by cardiology. Not currently on anticoagulation due to low CHA2DS2-VASc. Not having any cardiac symptoms.   Plan -No further evaluation at this time

## 2023-05-03 NOTE — Progress Notes (Addendum)
 Established Patient Office Visit  Subjective   Patient ID: Maria Nelson, female    DOB: [birth year]  Age: 68 y.o. MRN: [id number]  Chief Complaint  Patient presents with   Medical Management of Chronic Issues    S/P right knee surgery.    [name] returns to clinic today for f/u of chronic medical conditions. Please see assessment/plan in problem-based charting for further details of today's visit.    Patient Active Problem List   Diagnosis Date Noted   Vitamin D  deficiency 05/03/2023   Acute pain of right knee 01/26/2023   Pulmonary hypertension, unspecified (HCC) 10/04/2022   Alkaline phosphatase raised 10/04/2022   Aortic atherosclerosis (HCC) 07/06/2022   Tinea pedis of right foot 07/06/2022   Tubular adenoma of colon 07/06/2022   Stage 3a chronic kidney disease (HCC) 03/15/2022   Benign paroxysmal positional vertigo 09/03/2021   Healthcare maintenance 01/28/2021   Hyperlipidemia 11/22/2019   Paroxysmal atrial fibrillation (HCC) 08/24/2017   Back pain 06/23/2016   Seasonal and perennial allergic rhinitis 03/04/2015   Asthma with COPD (HCC) 06/19/2014   HNP (herniated nucleus pulposus), lumbar 04/16/2013   Tenosynovitis, de Quervain 11/15/2012   Morbid obesity with body mass index (BMI) of 50.0 to 59.9 in adult [employer] [facility]) 09/08/2012   Carpal tunnel syndrome 06/23/2011   Shoulder pain 09/02/2010   OSA (obstructive sleep apnea) 09/02/2010   Chronic urticaria 12/16/2009   STRESS INCONTINENCE 01/24/2007   History of peptic ulcer disease 07/26/2006      Objective:     BP 122/76 (BP Location: Right Arm, Patient Position: Sitting, Cuff Size: Large)   Pulse 65   Temp 97.9 F (36.6 C) (Oral)   Ht 5' (1.524 m)   Wt 258 lb 14.4 oz (117.4 kg)   LMP 04/19/2004 (Approximate)   SpO2 95% Comment: Ra  BMI 50.56 kg/m  BP Readings from Last 3 Encounters:  05/03/23 122/76  03/29/23 125/85  03/02/23 120/82   Wt Readings from Last 3 Encounters:  05/03/23  258 lb 14.4 oz (117.4 kg)  03/29/23 254 lb 13.6 oz (115.6 kg)  03/22/23 256 lb 11.2 oz (116.4 kg)    Physical Exam Constitutional:      General: She is not in acute distress.    Appearance: Normal appearance. She is obese. She is not ill-appearing.  Pulmonary:     Effort: Pulmonary effort is normal.  Musculoskeletal:        General: No swelling.  Neurological:     General: No focal deficit present.     Mental Status: She is alert.  Psychiatric:        Mood and Affect: Mood normal.        Behavior: Behavior normal.       Assessment & Plan:   Problem List Items Addressed This Visit       Cardiovascular and Mediastinum   Paroxysmal atrial fibrillation (HCC) (Chronic)   History of atrial fibrillation during remote hospitalization. Followed by cardiology. Not currently on anticoagulation due to low CHA2DS2-VASc. Not having any cardiac symptoms.   Plan -No further evaluation at this time       Aortic atherosclerosis (HCC)   Noted on prior imaging. Not currently on statin therapy as outlined elsewhere. Repeat lipid panel pending.  Plan -Lipid -Continue healthy lifestyle changes      Pulmonary hypertension, unspecified (HCC)   CT chest 11/2021 with mild pulmonary artery dilatation suggesting pHTN. Would suspect this is secondary to COPD/asthma and untreated OSA. She reports dyspnea with  some exertion but recovers well, improved from prior visits. Continues on therapy for treatment of COPD/asthma, although not currently wearing CPAP. She has seen sleep medicine and pulmonology for this issue.  Plan -Continue treatment for COPD/asthma -Consider CPAP therapy if pt is interested        Respiratory   OSA (obstructive sleep apnea)   Followed with sleep medicine, Dr. Shlomo, and pulmonology. Not currently interested in retrial of CPAP. Not a candidate for Inspire device due to obesity.  Addendum 1/24 History of severe OSA, AHI 39/hour. Has not tolerated multiple trials of  CPAP and not a candidate for the Inspire device due to obesity. Discussed Zepbound  for OSA and Ms. Flatley is interested in a trial. No contraindications noted. Will start at lowest dose and titrate up as tolerated. Reviewed side effects.   Plan -Zepbound  2.5 mg weekly -Return in 4-6 weeks      Relevant Medications   tirzepatide  (ZEPBOUND ) 2.5 MG/0.5ML Pen   Asthma with COPD (HCC)   Chronic and stable. Improved from prior visits. Follows with Dr. Meade, last visit 02/2023, and allergy .   Plan -Continue albuterol  prn, refill of nebulizer solution sent today -Continue Breztri  bid -F/u with pulmonology and allergy       Relevant Medications   albuterol  (PROVENTIL ) (2.5 MG/3ML) 0.083% nebulizer solution     Genitourinary   Stage 3a chronic kidney disease (HCC) - Primary   CKD3a, no albuminuria. Due for q68m BMP, annual ACR.  Plan -BMP, urine ACR, CBC      Relevant Orders   CBC no Diff (Completed)   CMP14 + Anion Gap (Completed)   Microalbumin / Creatinine Urine Ratio (Completed)     Other   History of peptic ulcer disease   Remains on PPI. Will continue in the setting of history of PUD with GI hemorrhage. Ms. Mincer noted similar symptoms this past summer which have now resolved with cessation of Lipitor. Unusual that this would cause dyspepsia symptoms.   Plan -continue ppi -cbc, iron studies      Morbid obesity with body mass index (BMI) of 50.0 to 59.9 in adult [facility])   Continued difficulty with weight, BMI > 50 today. Wegovy  denied at last visit. Alternative GLP-1 medications not covered on Medicare, same with Contrave. Phentermine products likely not a great option in someone who has had episodes of atrial fibrillation. She has been limited in terms of exercise until recently given knee pain, trying to get more active since this has improved post-surgery. Mostly doing home exercises as she is scared to walk in her neighborhood d/t stray dogs. Also making healthy  dietary changes including adding beans, vegetables, and fruits.   Plan -Continue healthy dietary and activity changes -Referral to P.R.E.P, transportation may be a limiting factor      Relevant Medications   tirzepatide  (ZEPBOUND ) 2.5 MG/0.5ML Pen   Other Relevant Orders   Amb Referral To Provider Referral Exercise Program (P.R.E.P)   Hyperlipidemia   ASCVD 5.9% with last lipid panel. Started on atorvastatin  20 mg daily by cardiology around June of last year. She notes that this caused her to have epigastric pain, similar to prior PUD, and she stopped taking this medication in August. Her symptoms have since resolved.  Plan -Recheck lipid panel        Relevant Orders   Lipid Profile (Completed)   Alkaline phosphatase raised   Mild elevation on prior labs. Repeat today.  Plan -CMP      Relevant Orders   CMP14 +  Anion Gap (Completed)   Acute pain of right knee   S/p right medial menisectomy 12/10 with significant improvement in knee pain. She is working with PT and feeling very good about the way things are going. No longer taking pain medication as prescribed by surgery team.  Plan -F/u with orthopedic surgery as advised -Continue PT       Vitamin D  deficiency   History of vitamin d  deficiency. Taking 1000 international units  daily.    Plan -Recheck vit D -Continue 1000 international units  daily      Relevant Orders   Vitamin D  (25 hydroxy) (Completed)   Other Visit Diagnoses       Gastroesophageal reflux disease, unspecified whether esophagitis present         Iron deficiency       Relevant Orders   Iron, TIBC and Ferritin Panel (Completed)       Return in about 6 months (around 10/31/2023).    Ronnald Sergeant, MD

## 2023-05-03 NOTE — Assessment & Plan Note (Signed)
 Chronic and stable. Improved from prior visits. Follows with Dr. Celine Mans, last visit 02/2023, and allergy.   Plan -Continue albuterol prn, refill of nebulizer solution sent today -Continue Breztri bid -F/u with pulmonology and allergy

## 2023-05-03 NOTE — Assessment & Plan Note (Addendum)
 Followed with sleep medicine, Dr. Shlomo, and pulmonology. Not currently interested in retrial of CPAP. Not a candidate for Inspire device due to obesity.  Addendum 1/24 History of severe OSA, AHI 39/hour. Has not tolerated multiple trials of CPAP and not a candidate for the Inspire device due to obesity. Discussed Zepbound  for OSA and Maria Nelson is interested in a trial. No contraindications noted. Will start at lowest dose and titrate up as tolerated. Reviewed side effects.   Plan -Zepbound  2.5 mg weekly -Return in 4-6 weeks

## 2023-05-03 NOTE — Assessment & Plan Note (Signed)
 CT chest 11/2021 with mild pulmonary artery dilatation suggesting pHTN. Would suspect this is secondary to COPD/asthma and untreated OSA. She reports dyspnea with some exertion but recovers well, improved from prior visits. Continues on therapy for treatment of COPD/asthma, although not currently wearing CPAP. She has seen sleep medicine and pulmonology for this issue.  Plan -Continue treatment for COPD/asthma -Consider CPAP therapy if pt is interested

## 2023-05-03 NOTE — Assessment & Plan Note (Signed)
 Mild elevation on prior labs. Repeat today.  Plan -CMP

## 2023-05-03 NOTE — Assessment & Plan Note (Signed)
 Remains on PPI. Will continue in the setting of history of PUD with GI hemorrhage. Maria Nelson noted similar symptoms this past summer which have now resolved with cessation of Lipitor. Unusual that this would cause dyspepsia symptoms.   Plan -continue ppi -cbc, iron studies

## 2023-05-03 NOTE — Patient Instructions (Signed)
 It was wonderful to see you today!  Keep up the great work with healthy eating and exercising at home. I have sent a referral to see if we can have you attend some more exercise classes after physical therapy.  Please contact your pharmacy about scheduling the shingles (Shingrix ) vaccine.

## 2023-05-03 NOTE — Assessment & Plan Note (Signed)
 S/p right medial menisectomy 12/10 with significant improvement in knee pain. She is working with PT and feeling very good about the way things are going. No longer taking pain medication as prescribed by surgery team.  Plan -F/u with orthopedic surgery as advised -Continue PT

## 2023-05-04 ENCOUNTER — Ambulatory Visit: Payer: 59 | Admitting: *Deleted

## 2023-05-04 DIAGNOSIS — L501 Idiopathic urticaria: Secondary | ICD-10-CM | POA: Diagnosis not present

## 2023-05-04 LAB — CMP14 + ANION GAP
ALT: 10 [IU]/L (ref 0–32)
AST: 15 [IU]/L (ref 0–40)
Albumin: 3.7 g/dL — ABNORMAL LOW (ref 3.9–4.9)
Alkaline Phosphatase: 142 [IU]/L — ABNORMAL HIGH (ref 44–121)
Anion Gap: 12 mmol/L (ref 10.0–18.0)
BUN/Creatinine Ratio: 11 — ABNORMAL LOW (ref 12–28)
BUN: 10 mg/dL (ref 8–27)
Bilirubin Total: <0.2 mg/dL (ref 0.0–1.2)
CO2: 24 mmol/L (ref 20–29)
Calcium: 9.5 mg/dL (ref 8.7–10.3)
Chloride: 105 mmol/L (ref 96–106)
Creatinine, Ser: 0.9 mg/dL (ref 0.57–1.00)
Globulin, Total: 3.2 g/dL (ref 1.5–4.5)
Glucose: 75 mg/dL (ref 70–99)
Potassium: 4.4 mmol/L (ref 3.5–5.2)
Sodium: 141 mmol/L (ref 134–144)
Total Protein: 6.9 g/dL (ref 6.0–8.5)
eGFR: 70 mL/min/{1.73_m2} (ref >59)

## 2023-05-04 LAB — LIPID PANEL
Chol/HDL Ratio: 3.7 {ratio} (ref 0.0–4.4)
Cholesterol, Total: 206 mg/dL — ABNORMAL HIGH (ref 100–199)
HDL: 56 mg/dL (ref >39)
LDL Chol Calc (NIH): 136 mg/dL — ABNORMAL HIGH (ref 0–99)
Triglycerides: 80 mg/dL (ref 0–149)
VLDL Cholesterol Cal: 14 mg/dL (ref 5–40)

## 2023-05-04 LAB — CBC
Hematocrit: 46.5 % (ref 34.0–46.6)
Hemoglobin: 14.2 g/dL (ref 11.1–15.9)
MCH: 24.8 pg — ABNORMAL LOW (ref 26.6–33.0)
MCHC: 30.5 g/dL — ABNORMAL LOW (ref 31.5–35.7)
MCV: 81 fL (ref 79–97)
Platelets: 297 10*3/uL (ref 150–450)
RBC: 5.73 x10E6/uL — ABNORMAL HIGH (ref 3.77–5.28)
RDW: 13.2 % (ref 11.7–15.4)
WBC: 7.8 10*3/uL (ref 3.4–10.8)

## 2023-05-04 LAB — IRON,TIBC AND FERRITIN PANEL
Ferritin: 111 ng/mL (ref 15–150)
Iron Saturation: 13 % — ABNORMAL LOW (ref 15–55)
Iron: 42 ug/dL (ref 27–139)
Total Iron Binding Capacity: 313 ug/dL (ref 250–450)
UIBC: 271 ug/dL (ref 118–369)

## 2023-05-04 LAB — VITAMIN D 25 HYDROXY (VIT D DEFICIENCY, FRACTURES): Vit D, 25-Hydroxy: 17.1 ng/mL — ABNORMAL LOW (ref 30.0–100.0)

## 2023-05-04 NOTE — Progress Notes (Signed)
 Kidney function continues to look good. eGFR >60 at the last two checks. Reviewed history and she has had mildly reduced function as far back as 2015, I do think this reflects CKD3a. ACR pending.  ALP remains mildly elevated, continue to monitor.

## 2023-05-04 NOTE — Progress Notes (Signed)
 ASCVD risk 8.6% off medication. Stopped rosuvastatin  last year due to stomach upset. We discussed alternative medication trial vs lifestyle and she wishes to try lifestyle. Reviewed dietary changes including reduced saturated fat and increased fiber. Recheck at future visit.

## 2023-05-04 NOTE — Progress Notes (Signed)
 No anemia

## 2023-05-04 NOTE — Progress Notes (Signed)
 Not taking her vitamin D  every day, she forgets. We discussed daily use and will recheck at future visit.

## 2023-05-05 ENCOUNTER — Other Ambulatory Visit: Payer: Self-pay | Admitting: Internal Medicine

## 2023-05-05 LAB — MICROALBUMIN / CREATININE URINE RATIO
Creatinine, Urine: 116.6 mg/dL
Microalb/Creat Ratio: 3 mg/g{creat} (ref 0–29)
Microalbumin, Urine: 3.1 ug/mL

## 2023-05-10 ENCOUNTER — Other Ambulatory Visit: Payer: Self-pay | Admitting: Internal Medicine

## 2023-05-13 MED ORDER — ZEPBOUND 2.5 MG/0.5ML ~~LOC~~ SOAJ: 2.5000 mg | SUBCUTANEOUS | 0 refills | Status: DC

## 2023-05-13 NOTE — Addendum Note (Signed)
Addended by: Dickie La on: 05/13/2023 09:32 AM   Modules accepted: Orders

## 2023-05-16 ENCOUNTER — Other Ambulatory Visit: Payer: Self-pay

## 2023-05-16 ENCOUNTER — Ambulatory Visit (INDEPENDENT_AMBULATORY_CARE_PROVIDER_SITE_OTHER): Payer: 59 | Admitting: Internal Medicine

## 2023-05-16 ENCOUNTER — Telehealth: Payer: Self-pay

## 2023-05-16 ENCOUNTER — Encounter: Payer: Self-pay | Admitting: Internal Medicine

## 2023-05-16 VITALS — BP 112/78 | HR 64 | Temp 98.5°F | Resp 12 | Ht 61.0 in | Wt 255.2 lb

## 2023-05-16 DIAGNOSIS — J302 Other seasonal allergic rhinitis: Secondary | ICD-10-CM

## 2023-05-16 DIAGNOSIS — L508 Other urticaria: Secondary | ICD-10-CM | POA: Diagnosis not present

## 2023-05-16 DIAGNOSIS — J4489 Other specified chronic obstructive pulmonary disease: Secondary | ICD-10-CM | POA: Diagnosis not present

## 2023-05-16 DIAGNOSIS — J3089 Other allergic rhinitis: Secondary | ICD-10-CM | POA: Diagnosis not present

## 2023-05-16 DIAGNOSIS — K219 Gastro-esophageal reflux disease without esophagitis: Secondary | ICD-10-CM | POA: Insufficient documentation

## 2023-05-16 MED ORDER — BREZTRI AEROSPHERE 160-9-4.8 MCG/ACT IN AERO: 2.0000 | INHALATION_SPRAY | Freq: Two times a day (BID) | RESPIRATORY_TRACT | 5 refills | Status: DC

## 2023-05-16 MED ORDER — EPINEPHRINE 0.3 MG/0.3ML IJ SOAJ: 0.3000 mg | INTRAMUSCULAR | 1 refills | Status: AC | PRN

## 2023-05-16 MED ORDER — ALBUTEROL SULFATE HFA 108 (90 BASE) MCG/ACT IN AERS: 2.0000 | INHALATION_SPRAY | Freq: Four times a day (QID) | RESPIRATORY_TRACT | 1 refills | Status: DC | PRN

## 2023-05-16 NOTE — Telephone Encounter (Signed)
Prior Authorization for patient (Zepbound 2.5MG /0.5ML pen-injectors) came through on cover my meds was submitted with last office notes awaiting approval or denial.  WUJ:[id number]

## 2023-05-16 NOTE — Patient Instructions (Addendum)
Asthma/COPD-current stable Continue Breztri 2 puffs twice a day with spacer to help prevent cough and wheeze. Reviewed proper technique Continue albuterol 2 puffs every 4 hours as needed for cough or wheeze OR albuterol via nebulizer 1 unit dose every 4-6 hours as needed for cough, wheeze, tightness in chest, or shortness of breath. You may use albuterol 2 puffs 5-15 minutes before activity Continue Xolair as below  Keep follow up with pulmonary for management of COPD  Chronic urticaria-controlled Allegra 60 mg once a day (dosing due to kidney disease) to try to control hives and swelling Continue Xolair injections every 4 weeks and have access to epinephrine auto injector device  Allergic rhinitis-controlled Continue allergen avoidance measures directed toward dust mites, cat, dog, grass, mold, tree pollen, weed pollen, and ragweed pollen as listed below Allegra (fexofenadine) 60 mg once a day as needed for a runny nose as listed above Consider saline nasal rinses as needed for nasal symptoms. Use this before any medicated nasal sprays for best result  Reflux-controlled Continue Pantoprazole 40 mg once a day as previously prescribed.  Continue dietary and lifestyle modifications as listed below  Call the clinic if this treatment plan is not working well for you   Schedule a follow up appointment in 6 months or sooner if needed It was a pleasure seeing you again in clinic today! Thank you for allowing me to participate in your care.  Tonny Bollman, MD [facility]   Reducing Pollen Exposure The American Academy of Allergy, Asthma and Immunology suggests the following steps to reduce your exposure to pollen during allergy seasons. Do not hang sheets or clothing out to dry; pollen may collect on these items. Do not mow lawns or spend time around freshly cut grass; mowing stirs up pollen. Keep windows closed at night.  Keep car windows closed while driving. Minimize  morning activities outdoors, a time when pollen counts are usually at their highest. Stay indoors as much as possible when pollen counts or humidity is high and on windy days when pollen tends to remain in the air longer. Use air conditioning when possible.  Many air conditioners have filters that trap the pollen spores. Use a HEPA room air filter to remove pollen form the indoor air you breathe.  Control of Dog or Cat Allergen Avoidance is the best way to manage a dog or cat allergy. If you have a dog or cat and are allergic to dog or cats, consider removing the dog or cat from the home. If you have a dog or cat but don't want to find it a new home, or if your family wants a pet even though someone in the household is allergic, here are some strategies that may help keep symptoms at bay:  Keep the pet out of your bedroom and restrict it to only a few rooms. Be advised that keeping the dog or cat in only one room will not limit the allergens to that room. Don't pet, hug or kiss the dog or cat; if you do, wash your hands with soap and water. High-efficiency particulate air (HEPA) cleaners run continuously in a bedroom or living room can reduce allergen levels over time. Regular use of a high-efficiency vacuum cleaner or a central vacuum can reduce allergen levels. Giving your dog or cat a bath at least once a week can reduce airborne allergen.  Control of Mold Allergen Mold and fungi can grow on a variety of surfaces provided certain temperature  and moisture conditions exist.  Outdoor molds grow on plants, decaying vegetation and soil.  The major outdoor mold, Alternaria and Cladosporium, are found in very high numbers during hot and dry conditions.  Generally, a late Summer - Fall peak is seen for common outdoor fungal spores.  Rain will temporarily lower outdoor mold spore count, but counts rise rapidly when the rainy period ends.  The most important indoor molds are Aspergillus and Penicillium.   Dark, humid and poorly ventilated basements are ideal sites for mold growth.  The next most common sites of mold growth are the bathroom and the kitchen.  Outdoor [employer] Use air conditioning and keep windows closed Avoid exposure to decaying vegetation. Avoid leaf raking. Avoid grain handling. Consider wearing a face mask if working in moldy areas.  Indoor Mold Control Maintain humidity below 50%. Clean washable surfaces with 5% bleach solution. Remove sources e.g. Contaminated carpets.   Control of Dust Mite Allergen Dust mites play a major role in allergic asthma and rhinitis. They occur in environments with high humidity wherever human skin is found. Dust mites absorb humidity from the atmosphere (ie, they do not drink) and feed on organic matter (including shed human and animal skin). Dust mites are a microscopic type of insect that you cannot see with the naked eye. High levels of dust mites have been detected from mattresses, pillows, carpets, upholstered furniture, bed covers, clothes, soft toys and any woven material. The principal allergen of the dust mite is found in its feces. A gram of dust may contain 1,000 mites and 250,000 fecal particles. Mite antigen is easily measured in the air during house cleaning activities. Dust mites do not bite and do not cause harm to humans, other than by triggering allergies/asthma.  Ways to decrease your exposure to dust mites in your home:  1. Encase mattresses, box springs and pillows with a mite-impermeable barrier or cover  2. Wash sheets, blankets and drapes weekly in hot water (130 F) with detergent and dry them in a dryer on the hot setting.  3. Have the room cleaned frequently with a vacuum cleaner and a damp dust-mop. For carpeting or rugs, vacuuming with a vacuum cleaner equipped with a high-efficiency particulate air (HEPA) filter. The dust mite allergic individual should not be in a room which is being cleaned and should wait 1  hour after cleaning before going into the room.  4. Do not sleep on upholstered furniture (eg, couches).  5. If possible removing carpeting, upholstered furniture and drapery from the home is ideal. Horizontal blinds should be eliminated in the rooms where the person spends the most time (bedroom, study, television room). Washable vinyl, roller-type shades are optimal.  6. Remove all non-washable stuffed toys from the bedroom. Wash stuffed toys weekly like sheets and blankets above.  7. Reduce indoor humidity to less than 50%. Inexpensive humidity monitors can be purchased at most hardware stores. Do not use a humidifier as can make the problem worse and are not recommended.

## 2023-05-16 NOTE — Progress Notes (Signed)
FOLLOW UP Date of Service/Encounter:  05/16/23  Subjective:  Maria Nelson (DOB: 08-May-1955) is a 68 y.o. female who [facility] in re-evaluation of the following: asthma COPD, seasonal and perennial allergic rhinitis, urticaria, angioedema, and gastroesophageal reflux disease  History obtained from: chart review and patient.  For Review, LV was on 09/01/22  with Dr.Effa Yarrow seen for routine follow-up. See below for summary of history and diagnostics.   Therapeutic plans/changes recommended: discussed consideration of dupixent in future for her asthma/COPD and hives. Hives controlled on Xolair. Having some increased rhiniits symptoms, recommended daily allegra.  ----------------------------------------------------- Pertinent History/Diagnostics:  Asthma/COPD : Managed on Breztri 2 puffs BID + Xolair. Previously on Fasenra but switched to Xolair due to CSU Also follows with PulmonaryHx of RLL nodule being monitored. 40 pack year smoking history Untreated OHS/OSA Hx of a. Fib followed by cardiology Solitary lung nodule in a high risk patient. Most recent CT chest: 09/06/22: "The 5 mm posterior right middle lobe pulmonary nodule identified on the previous exam has resolved completely in the interval compatible with infectious/inflammatory etiology. 2. No new or progressive findings on today's exam." Allergic Rhinitis:  Hx of GERD on pantoprazole 40 mg daily and controlled. Hx of CKD on low dose AH. - Serum environmental panel (05/02/20): + cat, dog, grass pollen, molds, tree pollen, borderline dust mites and weed pollens Chronic urticaria: Controlled on Xolair which was started 05/24/22 06/19/21-Her alpha gal level was negative.  Total IgE 758.  Normal baseline tryptase of 7.7  C4 within normal limits on 10/28/2015 as well as normal C1 esterase inhibitor, C3, and C4 on 07/30/2013  Other: Reflux on pantoprazole 40 mg  daily. --------------------------------------------------- Today presents for follow-up. Discussed the use of AI scribe software for clinical note transcription with the patient, who gave verbal consent to proceed.  History of Present Illness   The patient, with a history of asthma, COPD, and chronic hives, reports overall good health. She recently underwent knee surgery, which has significantly improved her pain. She continues to use Symbicort two puffs twice a day for her asthma and COPD, with minimal need for albuterol rescue inhaler, only twice in the past month. She notes that cold winter air can sometimes make breathing more difficult.  She had a follow-up CT chest scan, which showed good results. She was prescribed a cholesterol medication, which she stopped due to chest pain that resolved upon discontinuation of the medication. She continues to take pantoprazole for reflux, which she reports is well-controlled on the medication. She has tried to skip days, but her heartburn returns.  Her chronic hives have not been an issue for a long time. She is currently on Xolair injections every four weeks, which she believes is controlling her hives effectively. She has not had any allergic reactions recently and is managing any allergy symptoms with as-needed Allegra.  The patient requested refills for her Symbicort and albuterol inhaler.   Chart Review: Admission 03/29/23 for knee injury Last Pulm visit 02/21/23: doing well, Plan: "Continue the breztri 2 puffs twice daily, with albuterol as needed. Continue the allergy meds with allegra as needed. Continue the xolair.  Your CT Scan in May 2024 looked good - no cancer. You are next due for a scan in May 2025. " 05/04/23: last Xolair injection.  All medications reviewed by clinical staff and updated in chart. No new pertinent medical or surgical history except as noted in HPI.  ROS: All others negative except  as noted per HPI.   Objective:   BP 112/78 (BP Location: Left Arm, Patient Position: Sitting, Cuff Size: Large)   Pulse 64   Temp 98.5 F (36.9 C) (Temporal)   Resp 12   Ht 5\' 1"  (1.549 m)   Wt 255 lb 3.2 oz (115.8 kg)   LMP 04/19/2004 (Approximate)   SpO2 93%   BMI 48.22 kg/m  Body mass index is 48.22 kg/m. Physical Exam: General Appearance:  Alert, cooperative, no distress, appears stated age  Head:  Normocephalic, without obvious abnormality, atraumatic  Eyes:  Conjunctiva clear, EOM's intact  Ears EACs normal bilaterally and normal TMs bilaterally  Nose: Nares normal, hypertrophic turbinates, normal mucosa, and no visible anterior polyps  Throat: Lips, tongue normal; teeth and gums normal, normal posterior oropharynx  Neck: Supple, symmetrical  Lungs:   clear to auscultation bilaterally, Respirations unlabored, no coughing  Heart:  regular rate and rhythm and no murmur, Appears well perfused  Extremities: No edema  Skin: Skin color, texture, turgor normal and no rashes or lesions on visualized portions of skin  Neurologic: No gross deficits   Labs:  Lab Orders  No laboratory test(s) ordered today    Spirometry:  Tracings reviewed. Her effort: Good reproducible efforts. FVC: 2.13L FEV1: 1.29L, 75% predicted FEV1/FVC ratio: 0.61 Interpretation: Spirometry consistent with mild obstructive disease.  Please see scanned spirometry results for details.   Assessment/Plan  Asthma/COPD-current stable Continue Breztri 2 puffs twice a day with spacer to help prevent cough and wheeze. Reviewed proper technique Continue albuterol 2 puffs every 4 hours as needed for cough or wheeze OR albuterol via nebulizer 1 unit dose every 4-6 hours as needed for cough, wheeze, tightness in chest, or shortness of breath. You may use albuterol 2 puffs 5-15 minutes before activity Continue Xolair as below  Keep follow up with pulmonary for management of COPD  Chronic urticaria-controlled Allegra 60 mg once a day (dosing due  to kidney disease) to try to control hives and swelling Continue Xolair injections every 4 weeks and have access to epinephrine auto injector device  Allergic rhinitis-controlled Continue allergen avoidance measures directed toward dust mites, cat, dog, grass, mold, tree pollen, weed pollen, and ragweed pollen as listed below Allegra (fexofenadine) 60 mg once a day as needed for a runny nose as listed above Consider saline nasal rinses as needed for nasal symptoms. Use this before any medicated nasal sprays for best result  Reflux-controlled Continue Pantoprazole 40 mg once a day as previously prescribed.  Continue dietary and lifestyle modifications as listed below  Call the clinic if this treatment plan is not working well for you   Schedule a follow up appointment in 6 months or sooner if needed It was a pleasure seeing you again in clinic today! Thank you for allowing me to participate in your care.  Other: none  Tonny Bollman, MD  [facility]

## 2023-05-16 NOTE — Telephone Encounter (Signed)
Decision:Approved  Maria Nelson (Key: [id number]) PA Case ID #: [id number] Rx #: [id number] Need Help? Call us at [phone] Outcome Approved today by [facility] Medicare 2017 NCPDP Request Reference Number: [id number]. ZEPBOUND INJ 2.5MG  is approved through 04/18/2024. Your patient may now fill this prescription and it will be covered. Authorization Expiration Date: 04/18/2024 Drug Zepbound 2.5MG /0.5ML pen-injectors ePA cloud logo Form [employer] Medicare Part D Electronic Prior Authorization Form [phone] NCPDP) Original Claim Info [phone]

## 2023-06-01 ENCOUNTER — Ambulatory Visit: Payer: 59

## 2023-06-01 DIAGNOSIS — L501 Idiopathic urticaria: Secondary | ICD-10-CM

## 2023-06-29 ENCOUNTER — Ambulatory Visit: Payer: 59 | Admitting: *Deleted

## 2023-06-29 DIAGNOSIS — L501 Idiopathic urticaria: Secondary | ICD-10-CM | POA: Diagnosis not present

## 2023-07-14 ENCOUNTER — Telehealth: Payer: Self-pay | Admitting: *Deleted

## 2023-07-14 NOTE — Telephone Encounter (Signed)
 Contacted regarding PREP Class referral. She does not have access to transportation to attend class.

## 2023-07-27 ENCOUNTER — Ambulatory Visit

## 2023-07-27 DIAGNOSIS — L501 Idiopathic urticaria: Secondary | ICD-10-CM | POA: Diagnosis not present

## 2023-07-28 ENCOUNTER — Other Ambulatory Visit: Payer: Self-pay | Admitting: Internal Medicine

## 2023-08-24 ENCOUNTER — Ambulatory Visit

## 2023-08-24 DIAGNOSIS — L501 Idiopathic urticaria: Secondary | ICD-10-CM

## 2023-09-21 ENCOUNTER — Ambulatory Visit

## 2023-09-21 DIAGNOSIS — L501 Idiopathic urticaria: Secondary | ICD-10-CM | POA: Diagnosis not present

## 2023-10-19 ENCOUNTER — Ambulatory Visit

## 2023-10-19 DIAGNOSIS — L501 Idiopathic urticaria: Secondary | ICD-10-CM

## 2023-10-26 ENCOUNTER — Ambulatory Visit

## 2023-10-26 VITALS — Ht 62.0 in | Wt 255.0 lb

## 2023-10-26 DIAGNOSIS — Z0000 Encounter for general adult medical examination without abnormal findings: Secondary | ICD-10-CM | POA: Diagnosis not present

## 2023-10-26 NOTE — Progress Notes (Signed)
 Because this visit was a virtual/telehealth visit,  certain criteria was not obtained, such a blood pressure, CBG if applicable, and timed get up and go. Any medications not marked as taking were not mentioned during the medication reconciliation part of the visit. Any vitals not documented were not able to be obtained due to this being a telehealth visit or patient was unable to self-report a recent blood pressure reading due to a lack of equipment at home via telehealth. Vitals that have been documented are verbally provided by the patient.   Subjective:   Maria Nelson is a 68 y.o. who presents for a Medicare Wellness preventive visit.  As a reminder, Annual Wellness Visits don't include a physical exam, and some assessments may be limited, especially if this visit is performed virtually. We may recommend an in-person follow-up visit with your provider if needed.  Visit Complete: Virtual I connected with  Maria Nelson on 10/26/23 by a audio enabled telemedicine application and verified that I am speaking with the correct person using two identifiers.  Patient Location: Home  Provider Location: Home Office  I discussed the limitations of evaluation and management by telemedicine. The patient expressed understanding and agreed to proceed.  Vital Signs: Because this visit was a virtual/telehealth visit, some criteria may be missing or patient reported. Any vitals not documented were not able to be obtained and vitals that have been documented are patient reported.  VideoDeclined- This patient declined Librarian, academic. Therefore the visit was completed with audio only.  Persons Participating in Visit: Patient.  AWV Questionnaire: No: Patient Medicare AWV questionnaire was not completed prior to this visit.  Cardiac Risk Factors include: advanced age (>3men, >61 women);obesity (BMI >30kg/m2)     Objective:    Today's  Vitals   10/26/23 0952  Weight: 255 lb (115.7 kg)  Height: 5' 2 (1.575 m)  PainSc: 5   PainLoc: Knee   Body mass index is 46.64 kg/m.     10/26/2023    9:54 AM 05/03/2023    9:13 AM 03/29/2023    8:15 AM 03/21/2023    2:29 PM 09/22/2022    4:14 PM 07/05/2022    9:36 AM 03/15/2022   11:12 AM  Advanced Directives  Does Patient Have a Medical Advance Directive? No No No No No No No  Would patient like information on creating a medical advance directive? No - Patient declined No - Patient declined No - Patient declined No - Patient declined Yes (MAU/Ambulatory/Procedural Areas - Information given) No - Patient declined No - Patient declined    Current Medications (verified) Outpatient Encounter Medications as of 10/26/2023  Medication Sig   acetaminophen  (TYLENOL ) 500 MG tablet Take 2 tablets (1,000 mg total) by mouth every 6 (six) hours as needed.   albuterol  (PROVENTIL ) (2.5 MG/3ML) 0.083% nebulizer solution Use 1 vial in nebulizer up to 3 times a day when albuterol  inhaler not working. If no better or getting worse, call 911 or physician line.   albuterol  (VENTOLIN  HFA) 108 (90 Base) MCG/ACT inhaler Inhale 2 puffs into the lungs every 6 (six) hours as needed for wheezing or shortness of breath.   BREZTRI  AEROSPHERE 160-9-4.8 MCG/ACT AERO Inhale 2 puffs into the lungs in the morning and at bedtime.   clotrimazole  (LOTRIMIN ) 1 % cream Apply to right foot and between right toes twice daily for two weeks.   EPINEPHrine  (EPIPEN  2-PAK) 0.3 mg/0.3 mL IJ SOAJ injection Inject 0.3 mg into the muscle  as needed (for allergic reaction).   pantoprazole  (PROTONIX ) 40 MG tablet Take 1 tablet by mouth once daily   tirzepatide  (ZEPBOUND ) 2.5 MG/0.5ML Pen Inject 2.5 mg into the skin once a week. (Patient not taking: Reported on 05/16/2023)   Vitamin D , Cholecalciferol , 25 MCG (1000 UT) CAPS Take 1 capsule by mouth daily.   XOLAIR  150 MG/ML prefilled syringe INJECT 300MG  SUBCUTANEOUSLY  EVERY 4 WEEKS    Facility-Administered Encounter Medications as of 10/26/2023  Medication   omalizumab  (XOLAIR ) prefilled syringe 300 mg    Allergies (verified) Bee venom, Aspirin , and Other   History: Past Medical History:  Diagnosis Date   Adnexal pain 09/06/2018   ANGIOEDEMA 12/16/2009   Asthma    Blood pressure elevated without history of HTN 03/05/2014   Carpal tunnel syndrome 06/23/2011   Patient notes history of bilateral carpal tunnel syndrome.  Now has symptoms of right hand carpal tunnel.    Chronic pain of both knees 08/16/2016   COPD (chronic obstructive pulmonary disease) (HCC)    secondary to tobacco use // No PFTs on file   COVID-19    Dyspepsia 08/24/2017   Emphysema of lung (HCC)    not on O2 at home (09/08/2022)   Encounter for screening mammogram for malignant neoplasm of breast 07/06/2022   ETOH abuse    Pt stopped in 3/08   Gastrointestinal hemorrhage    secondary to PUD on March 2008   GERD (gastroesophageal reflux disease)    on meds   Low iron 12/03/2021   OSA on CPAP    noncompliant with CPAP (2/2 it being depressing)   Other fatigue 07/06/2022   Peptic ulcer disease    +H. pylori (antigen)- Dr. Obie- treated   Pulmonary nodule less than 6 mm determined by computed tomography of lung 12/03/2021   STRESS INCONTINENCE 01/24/2007   Tobacco abuse    quit in 2010   Urinary incontinence    Urticaria    Urticaria 07/01/2015   Past Surgical History:  Procedure Laterality Date   BREAST EXCISIONAL BIOPSY Right 1994   COLONOSCOPY     KNEE ARTHROSCOPY WITH MEDIAL MENISECTOMY Right 03/29/2023   Procedure: KNEE ARTHROSCOPY WITH MEDIAL MENISECTOMY AND INJECTION;  Surgeon: Beverley Evalene BIRCH, MD;  Location: [facility];  Service: Orthopedics;  Laterality: Right;   MANDIBLE SURGERY     due to trauma   TUBAL LIGATION     Family History  Problem Relation Age of Onset   Diabetes Mother    Coronary artery disease Mother 45       requiring 5 vessel CABG    Diabetes Sister    Breast cancer Sister 21       died 2/2 breast ca age 81-60   Colon polyps Neg Hx    Colon cancer Neg Hx    Esophageal cancer Neg Hx    Rectal cancer Neg Hx    Stomach cancer Neg Hx    Social History   Socioeconomic History   Marital status: Single    Spouse name: Not on file   Number of children: 3   Years of education: 12th grade   Highest education level: Not on file  Occupational History   Occupation: Disability    Comment: previously worked in [employer] had to stop 2/2 occupational exposures leading to exacerbations of asthma   Tobacco Use   Smoking status: Former    Current packs/day: 0.00    Average packs/day: 0.5 packs/day for 40.0 years (20.0 ttl  pk-yrs)    Types: Cigarettes    Start date: 12/04/1970    Quit date: 12/04/2010    Years since quitting: 12.9    Passive exposure: Never   Smokeless tobacco: Never  Vaping Use   Vaping status: Never Used  Substance and Sexual Activity   Alcohol use: Not Currently   Drug use: No   Sexual activity: Not Currently  Other Topics Concern   Not on file  Social History Narrative   Pt is not working now, was previously a Conservation officer, nature at [employer].      Pt lives with her son      Pt has received financial assistance approval for 100% discount at [facility] and has [facility] card.    Social Drivers of Corporate investment banker Strain: Low Risk  (10/26/2023)   Overall Financial Resource Strain (CARDIA)    Difficulty of Paying Living Expenses: Not hard at all  Food Insecurity: No Food Insecurity (10/26/2023)   Hunger Vital Sign    Worried About Running Out of Food in the Last Year: Never true    Ran Out of Food in the Last Year: Never true  Transportation Needs: No Transportation Needs (10/26/2023)   PRAPARE - Administrator, Civil Service (Medical): No    Lack of Transportation (Non-Medical): No  Physical Activity: Sufficiently Active (10/26/2023)   Exercise Vital Sign    Days of Exercise per  Week: 5 days    Minutes of Exercise per Session: 60 min  Stress: No Stress Concern Present (10/26/2023)   [employer] of Occupational Health - Occupational Stress Questionnaire    Feeling of Stress: Not at all  Social Connections: Moderately Isolated (10/26/2023)   Social Connection and Isolation Panel    Frequency of Communication with Friends and Family: More than three times a week    Frequency of Social Gatherings with Friends and Family: Twice a week    Attends Religious Services: 1 to 4 times per year    Active Member of [employer] or Organizations: No    Attends Engineer, structural: Never    Marital Status: Divorced    Tobacco Counseling Counseling given: Not Answered    Clinical Intake:  Pre-visit preparation completed: Yes  Pain : 0-10 Pain Score: 5  Pain Type: Chronic pain Pain Location: Knee Pain Orientation: Right Pain Descriptors / Indicators: Aching Pain Onset: More than a month ago Pain Frequency: Intermittent     BMI - recorded: 46.64 Nutritional Risks: None Diabetes: No  Lab Results  Component Value Date   HGBA1C 5.3 07/13/2021   HGBA1C 5.6 08/24/2017   HGBA1C 5.6 12/12/2010     How often do you need to have someone help you when you read instructions, pamphlets, or other written materials from your doctor or pharmacy?: 1 - Never  Interpreter Needed?: No  Information entered by :: Roz Fuller, LPN.   Activities of Daily Living     10/26/2023    9:55 AM 05/03/2023    9:13 AM  In your present state of health, do you have any difficulty performing the following activities:  Hearing? 0 0  Vision? 0 0  Difficulty concentrating or making decisions? 0 0  Walking or climbing stairs? 0 1  Dressing or bathing? 0 0  Doing errands, shopping? 0 1  Preparing Food and eating ? N   Using the Toilet? N   In the past six months, have you accidently leaked urine? N   Do  you have problems with loss of bowel control? N   Managing your  Medications? N   Managing your Finances? N   Housekeeping or managing your Housekeeping? N     Patient Care Team: Karna Fellows, MD as PCP - General (Internal Medicine) Shlomo Wilbert SAUNDERS, MD as PCP - Cardiology (Cardiology) Meade Verdon RAMAN, MD as Consulting Physician (Pulmonary Disease)  I have updated your Care Teams any recent Medical Services you may have received from other providers in the past year.     Assessment:   This is a routine wellness examination for Asuzena.  Hearing/Vision screen Hearing Screening - Comments:: Adequate hearing. Vision Screening - Comments:: Adequate vision, wears eyeglasses and up to date with eye exam with [facility].   Goals Addressed             This Visit's Progress    10/26/2023: To continue to maintain my health, physically active in water aerobics and staying independent.         Depression Screen     10/26/2023    9:56 AM 05/03/2023    9:13 AM 01/26/2023    4:25 PM 10/04/2022    9:35 AM 09/22/2022    4:13 PM 07/05/2022    9:36 AM 03/15/2022   11:12 AM  PHQ 2/9 Scores  PHQ - 2 Score 0 0 0 0 0 0 0  PHQ- 9 Score 0          Fall Risk     10/26/2023    9:54 AM 05/03/2023    9:13 AM 03/02/2023    9:04 AM 01/26/2023    2:09 PM 10/04/2022    9:35 AM  Fall Risk   Falls in the past year? 0 0 0 0 0  Number falls in past yr: 0 0 0 0 0  Injury with Fall? 0 0 0 0 0  Risk for fall due to : No Fall Risks No Fall Risks Impaired balance/gait  No Fall Risks  Follow up Falls evaluation completed  Falls evaluation completed Falls evaluation completed Falls evaluation completed;Falls prevention discussed    MEDICARE RISK AT HOME:  Medicare Risk at Home Any stairs in or around the home?: No If so, are there any without handrails?: No Home free of loose throw rugs in walkways, pet beds, electrical cords, etc?: Yes Adequate lighting in your home to reduce risk of falls?: Yes Life alert?: No Use of a cane, walker or w/c?: No Grab bars in the  bathroom?: No Shower chair or bench in shower?: No Elevated toilet seat or a handicapped toilet?: No  TIMED UP AND GO:  Was the test performed?  No  Cognitive Function: Declined/Normal: No cognitive concerns noted by patient or family. Patient alert, oriented, able to answer questions appropriately and recall recent events. No signs of memory loss or confusion.    10/26/2023    9:57 AM  MMSE - Mini Mental State Exam  Not completed: Unable to complete        10/26/2023   10:01 AM 09/22/2022    4:14 PM 08/03/2021    2:27 PM  6CIT Screen  What Year? 0 points 0 points 0 points  What month? 0 points 0 points 0 points  What time? 0 points 0 points 0 points  Count back from 20 0 points 0 points 0 points  Months in reverse 0 points 0 points 0 points  Repeat phrase 0 points 0 points 0 points  Total Score 0 points 0 points  0 points    Immunizations Immunization History  Administered Date(s) Administered   Fluad Quad(high Dose 65+) 01/28/2021, 03/15/2022   Fluad Trivalent(High Dose 65+) 02/21/2023   Influenza Whole 01/24/2007, 06/06/2009, 12/24/2009, 12/25/2010   Influenza,inj,Quad PF,6+ Mos 02/14/2013, 03/05/2014, 02/11/2016, 05/10/2018, 12/05/2018   PFIZER(Purple Top)SARS-COV-2 Vaccination 08/21/2019   PNEUMOCOCCAL CONJUGATE-20 07/13/2021   PPD Test 09/21/2022   Pneumococcal Polysaccharide-23 06/06/2009   Pneumococcal-Unspecified 07/25/2022   Tdap 06/23/2011, 07/25/2022    Screening Tests Health Maintenance  Topic Date Due   Zoster Vaccines- Shingrix  (1 of 2) Never done   COVID-19 Vaccine (2 - Pfizer risk series) 09/11/2019   Lung Cancer Screening  09/06/2023   INFLUENZA VACCINE  11/18/2023   Medicare Annual Wellness (AWV)  10/25/2024   MAMMOGRAM  01/04/2025   Colonoscopy  09/28/2025   DTaP/Tdap/Td (3 - Td or Tdap) 07/24/2032   Pneumococcal Vaccine: 50+ Years  Completed   DEXA SCAN  Completed   Hepatitis C Screening  Completed   Hepatitis B Vaccines  Aged Out   HPV  VACCINES  Aged Out   Meningococcal B Vaccine  Aged Out    Health Maintenance  Health Maintenance Due  Topic Date Due   Zoster Vaccines- Shingrix  (1 of 2) Never done   COVID-19 Vaccine (2 - Pfizer risk series) 09/11/2019   Lung Cancer Screening  09/06/2023   Health Maintenance Items Addressed: Yes Patient aware of current care gaps.  Immunization record was verified by [facility] and updated in patient's chart. Patient is due for a lung cancer screening, Shingrix  and Covid vaccines.  Additional Screening:  Vision Screening: Recommended annual ophthalmology exams for early detection of glaucoma and other disorders of the eye. Would you like a referral to an eye doctor? No    Dental Screening: Recommended annual dental exams for proper oral hygiene  Community Resource Referral / Chronic Care Management: CRR required this visit?  No   CCM required this visit?  No   Plan:    I have personally reviewed and noted the following in the patient's chart:   Medical and social history Use of alcohol, tobacco or illicit drugs  Current medications and supplements including opioid prescriptions. Patient is not currently taking opioid prescriptions. Functional ability and status Nutritional status Physical activity Advanced directives List of other physicians Hospitalizations, surgeries, and ER visits in previous 12 months Vitals Screenings to include cognitive, depression, and falls Referrals and appointments  In addition, I have reviewed and discussed with patient certain preventive protocols, quality metrics, and best practice recommendations. A written personalized care plan for preventive services as well as general preventive health recommendations were provided to patient.   Roz LOISE Fuller, LPN   2/[id number]/7974   After Visit Summary: (Declined) Due to this being a telephonic visit, with patients personalized plan was offered to patient but patient Declined AVS at this time   Notes:  Patient aware of current care gaps.  Immunization record was verified by [facility] and updated in patient's chart. Patient is due for a lung cancer screening, Shingrix  and Covid vaccines.

## 2023-10-26 NOTE — Patient Instructions (Signed)
 Maria Nelson , Thank you for taking time out of your busy schedule to complete your Annual Wellness Visit with me. I enjoyed our conversation and look forward to speaking with you again next year. I, as well as your care team,  appreciate your ongoing commitment to your health goals. Please review the following plan we discussed and let me know if I can assist you in the future. Your Game plan/ To Do List    Referrals: If you haven't heard from the office you've been referred to, please reach out to them at the phone provided.   Follow up Visits: Next Medicare AWV with our clinical staff: 10/31/2024 at 9:50 a.m. phone visit with Nurse Roz   Have you seen your provider in the last 6 months (3 months if uncontrolled diabetes)? Yes Next Office Visit with your provider: Office will call patient to schedule an appointment  Clinician Recommendations:  Aim for 30 minutes of exercise or brisk walking, 6-8 glasses of water, and 5 servings of fruits and vegetables each day.       This is a list of the screening recommended for you and due dates:  Health Maintenance  Topic Date Due   Zoster (Shingles) Vaccine (1 of 2) Never done   COVID-19 Vaccine (2 - Pfizer risk series) 09/11/2019   Screening for Lung Cancer  09/06/2023   Flu Shot  11/18/2023   Medicare Annual Wellness Visit  10/25/2024   Mammogram  01/04/2025   Colon Cancer Screening  09/28/2025   DTaP/Tdap/Td vaccine (3 - Td or Tdap) 07/24/2032   Pneumococcal Vaccine for age over 49  Completed   DEXA scan (bone density measurement)  Completed   Hepatitis C Screening  Completed   Hepatitis B Vaccine  Aged Out   HPV Vaccine  Aged Out   Meningitis B Vaccine  Aged Out    Advanced directives: (Declined) Advance directive discussed with you today. Even though you declined this today, please call our office should you change your mind, and we can give you the proper paperwork for you to fill out. Advance Care Planning is important because  it:  [x]  Makes sure you receive the medical care that is consistent with your values, goals, and preferences  [x]  It provides guidance to your family and loved ones and reduces their decisional burden about whether or not they are making the right decisions based on your wishes.  Follow the link provided in your after visit summary or read over the paperwork we have mailed to you to help you started getting your Advance Directives in place. If you need assistance in completing these, please reach out to us  so that we can help you!  See attachments for Preventive Care and Fall Prevention Tips.

## 2023-11-05 ENCOUNTER — Other Ambulatory Visit: Payer: Self-pay | Admitting: Internal Medicine

## 2023-11-14 ENCOUNTER — Other Ambulatory Visit: Payer: Self-pay

## 2023-11-14 ENCOUNTER — Ambulatory Visit

## 2023-11-14 ENCOUNTER — Encounter: Payer: Self-pay | Admitting: Internal Medicine

## 2023-11-14 ENCOUNTER — Ambulatory Visit (INDEPENDENT_AMBULATORY_CARE_PROVIDER_SITE_OTHER): Payer: 59 | Admitting: Internal Medicine

## 2023-11-14 VITALS — BP 124/78 | HR 82 | Temp 98.0°F | Ht 60.0 in | Wt 254.7 lb

## 2023-11-14 DIAGNOSIS — K219 Gastro-esophageal reflux disease without esophagitis: Secondary | ICD-10-CM

## 2023-11-14 DIAGNOSIS — J4489 Other specified chronic obstructive pulmonary disease: Secondary | ICD-10-CM | POA: Diagnosis not present

## 2023-11-14 DIAGNOSIS — J3089 Other allergic rhinitis: Secondary | ICD-10-CM

## 2023-11-14 DIAGNOSIS — L508 Other urticaria: Secondary | ICD-10-CM | POA: Diagnosis not present

## 2023-11-14 DIAGNOSIS — J302 Other seasonal allergic rhinitis: Secondary | ICD-10-CM

## 2023-11-14 MED ORDER — ALBUTEROL SULFATE HFA 108 (90 BASE) MCG/ACT IN AERS: 2.0000 | INHALATION_SPRAY | Freq: Four times a day (QID) | RESPIRATORY_TRACT | 1 refills | Status: DC | PRN

## 2023-11-14 MED ORDER — PANTOPRAZOLE SODIUM 40 MG PO TBEC: 40.0000 mg | DELAYED_RELEASE_TABLET | Freq: Every day | ORAL | 1 refills | Status: AC

## 2023-11-14 MED ORDER — BREZTRI AEROSPHERE 160-9-4.8 MCG/ACT IN AERO: 2.0000 | INHALATION_SPRAY | Freq: Two times a day (BID) | RESPIRATORY_TRACT | 5 refills | Status: AC

## 2023-11-14 MED ORDER — ALBUTEROL SULFATE (2.5 MG/3ML) 0.083% IN NEBU: INHALATION_SOLUTION | RESPIRATORY_TRACT | 3 refills | Status: AC

## 2023-11-14 NOTE — Progress Notes (Signed)
 FOLLOW UP Date of Service/Encounter:   11/14/2023  Subjective:  Maria Nelson (DOB: 1956-04-06) is a 68 y.o. female who [facility] in re-evaluation of the following: asthma COPD, seasonal and perennial allergic rhinitis, urticaria, angioedema, and gastroesophageal reflux disease  History obtained from: chart review and patient.  For Review, LV was on 05/16/23  with Dr.Mosella Kasa seen for routine follow-up. See below for summary of history and diagnostics.   Therapeutic plans/changes recommended: Fev1 75%, doing well on breztri , CIU controlled on Xolair , reflux controlled on pantoprazole . ----------------------------------------------------- Pertinent History/Diagnostics:  Asthma/COPD : Managed on Breztri  2 puffs BID + Xolair . Previously on Fasenra  but switched to Xolair  due to CSU Also follows with PulmonaryHx of RLL nodule being monitored. 40 pack year smoking history Untreated OHS/OSA Hx of a. Fib followed by cardiology Solitary lung nodule in a high risk patient. Most recent CT chest: 09/06/22: The 5 mm posterior right middle lobe pulmonary nodule identified on the previous exam has resolved completely in the interval compatible with infectious/inflammatory etiology. 2. No new or progressive findings on today's exam. Allergic Rhinitis:  Hx of GERD on pantoprazole  40 mg daily and controlled. Hx of CKD on low dose AH. - Serum environmental panel (05/02/20): + cat, dog, grass pollen, molds, tree pollen, borderline dust mites and weed pollens Chronic urticaria: Controlled on Xolair  which was started 05/24/22 06/19/21-Her alpha gal level was negative.  Total IgE 758.  Normal baseline tryptase of 7.7  C4 within normal limits on 10/28/2015 as well as normal C1 esterase inhibitor, C3, and C4 on 07/30/2013  Other: Reflux on pantoprazole  40 mg daily. --------------------------------------------------- Today presents for  follow-up. Discussed the use of AI scribe software for clinical note transcription with the patient, who gave verbal consent to proceed.  History of Present Illness Maria Nelson is a 68 year old female who presents for follow-up and Xolair  injections.  Xolair  and hives: - Due for next Xolair  injection today - Xolair  injections are well tolerated without complications - No recent episodes of urticaria - Infrequent use of Allegra  60 mg as needed - No nasal symptoms including rhinorrhea, congestion, or nasal obstruction  Respiratory symptoms - Uses Breztri  two puffs twice daily - Increased use of albuterol  due to dyspnea when exposed to summer heat - No recent need for systemic corticosteroids, antibiotics, or emergency room visits  Gastrointestinal symptoms - No symptoms of gastroesophageal reflux disease - Continues pantoprazole  as prescribed   Chart Review: Last Xolair  10/19/23.  All medications reviewed by clinical staff and updated in chart. No new pertinent medical or surgical history except as noted in HPI.  ROS: All others negative except as noted per HPI.   Objective:  BP 124/78 (BP Location: Left Arm, Patient Position: Sitting)   Pulse 82   Temp 98 F (36.7 C) (Temporal)   Ht 5' (1.524 m)   Wt 254 lb 11.2 oz (115.5 kg)   LMP 04/19/2004 (Approximate)   SpO2 98%   BMI 49.74 kg/m  Body mass index is 49.74 kg/m. Physical Exam: General Appearance:  Alert, cooperative, no distress, appears stated age  Head:  Normocephalic, without obvious abnormality, atraumatic  Eyes:  Conjunctiva clear, EOM's intact  Ears EACs normal bilaterally and normal TMs bilaterally  Nose: Nares normal, hypertrophic turbinates, normal mucosa, and no visible anterior polyps  Throat: Lips, tongue normal; teeth and gums normal, normal posterior oropharynx  Neck: Supple, symmetrical  Lungs:   clear to auscultation bilaterally, Respirations unlabored,  no coughing  Heart:   regular rate and rhythm and no murmur, Appears well perfused  Extremities: No edema  Skin: Skin color, texture, turgor normal and no rashes or lesions on visualized portions of skin  Neurologic: No gross deficits   Labs:  Lab Orders  No laboratory test(s) ordered today    Spirometry:  Tracings reviewed. Her effort: Good reproducible efforts. FVC: 2.17L FEV1: 1.20L, 71% predicted FEV1/FVC ratio: 0.55 Interpretation: Spirometry consistent with mild obstructive disease.  Please see scanned spirometry results for details.   Assessment/Plan   Asthma/COPD-current stable Continue Breztri  2 puffs twice a day with spacer to help prevent cough and wheeze. Reviewed proper technique Continue albuterol  2 puffs every 4 hours as needed for cough or wheeze OR albuterol  via nebulizer 1 unit dose every 4-6 hours as needed for cough, wheeze, tightness in chest, or shortness of breath. You may use albuterol  2 puffs 5-15 minutes before activity Continue Xolair  as below  Keep follow up with pulmonary for management of COPD  Chronic urticaria-controlled Allegra  60 mg once a day  as needed(dosing due to kidney disease) to try to control hives and swelling Continue Xolair  injections every 4 weeks and have access to epinephrine  auto injector device  Allergic rhinitis-controlled Continue allergen avoidance measures directed toward dust mites, cat, dog, grass, mold, tree pollen, weed pollen, and ragweed pollen as listed below Allegra  (fexofenadine ) 60 mg once a day as needed for a runny nose as listed above Consider saline nasal rinses as needed for nasal symptoms. Use this before any medicated nasal sprays for best result  Reflux-controlled Continue Pantoprazole  40 mg once a day as previously prescribed.  Continue dietary and lifestyle modifications as listed below  Call the clinic if this treatment plan is not working well for you   Schedule a follow up appointment in 6 months or sooner if  needed It was a pleasure seeing you again in clinic today! Thank you for allowing me to participate in your care.  Other: Xolair  given in clinic today  Rocky Endow, MD  [facility] 

## 2023-11-14 NOTE — Patient Instructions (Addendum)
 Asthma/COPD-current stable Continue Breztri  2 puffs twice a day with spacer to help prevent cough and wheeze. Reviewed proper technique Continue albuterol  2 puffs every 4 hours as needed for cough or wheeze OR albuterol  via nebulizer 1 unit dose every 4-6 hours as needed for cough, wheeze, tightness in chest, or shortness of breath. You may use albuterol  2 puffs 5-15 minutes before activity Continue Xolair  as below  Keep follow up with pulmonary for management of COPD  Chronic urticaria-controlled Allegra  60 mg once a day  as needed(dosing due to kidney disease) to try to control hives and swelling Continue Xolair  injections every 4 weeks and have access to epinephrine  auto injector device  Allergic rhinitis-controlled Continue allergen avoidance measures directed toward dust mites, cat, dog, grass, mold, tree pollen, weed pollen, and ragweed pollen as listed below Allegra  (fexofenadine ) 60 mg once a day as needed for a runny nose as listed above Consider saline nasal rinses as needed for nasal symptoms. Use this before any medicated nasal sprays for best result  Reflux-controlled Continue Pantoprazole  40 mg once a day as previously prescribed.  Continue dietary and lifestyle modifications as listed below  Call the clinic if this treatment plan is not working well for you   Schedule a follow up appointment in 6 months or sooner if needed It was a pleasure seeing you again in clinic today! Thank you for allowing me to participate in your care.  Rocky Endow, MD [facility]   Reducing Pollen Exposure The American Academy of Allergy , Asthma and Immunology suggests the following steps to reduce your exposure to pollen during allergy  seasons. Do not hang sheets or clothing out to dry; pollen may collect on these items. Do not mow lawns or spend time around freshly cut grass; mowing stirs up pollen. Keep windows closed at night.  Keep car windows closed while  driving. Minimize morning activities outdoors, a time when pollen counts are usually at their highest. Stay indoors as much as possible when pollen counts or humidity is high and on windy days when pollen tends to remain in the air longer. Use air conditioning when possible.  Many air conditioners have filters that trap the pollen spores. Use a HEPA room air filter to remove pollen form the indoor air you breathe.  Control of Dog or Cat Allergen Avoidance is the best way to manage a dog or cat allergy . If you have a dog or cat and are allergic to dog or cats, consider removing the dog or cat from the home. If you have a dog or cat but don't want to find it a new home, or if your family wants a pet even though someone in the household is allergic, here are some strategies that may help keep symptoms at bay:  Keep the pet out of your bedroom and restrict it to only a few rooms. Be advised that keeping the dog or cat in only one room will not limit the allergens to that room. Don't pet, hug or kiss the dog or cat; if you do, wash your hands with soap and water. High-efficiency particulate air (HEPA) cleaners run continuously in a bedroom or living room can reduce allergen levels over time. Regular use of a high-efficiency vacuum cleaner or a central vacuum can reduce allergen levels. Giving your dog or cat a bath at least once a week can reduce airborne allergen.  Control of Mold Allergen Mold and fungi can grow on a variety of surfaces provided  certain temperature and moisture conditions exist.  Outdoor molds grow on plants, decaying vegetation and soil.  The major outdoor mold, Alternaria and Cladosporium, are found in very high numbers during hot and dry conditions.  Generally, a late Summer - Fall peak is seen for common outdoor fungal spores.  Rain will temporarily lower outdoor mold spore count, but counts rise rapidly when the rainy period ends.  The most important indoor molds are Aspergillus  and Penicillium.  Dark, humid and poorly ventilated basements are ideal sites for mold growth.  The next most common sites of mold growth are the bathroom and the kitchen.  Outdoor [employer] Use air conditioning and keep windows closed Avoid exposure to decaying vegetation. Avoid leaf raking. Avoid grain handling. Consider wearing a face mask if working in moldy areas.  Indoor Mold Control Maintain humidity below 50%. Clean washable surfaces with 5% bleach solution. Remove sources e.g. Contaminated carpets.   Control of Dust Mite Allergen Dust mites play a major role in allergic asthma and rhinitis. They occur in environments with high humidity wherever human skin is found. Dust mites absorb humidity from the atmosphere (ie, they do not drink) and feed on organic matter (including shed human and animal skin). Dust mites are a microscopic type of insect that you cannot see with the naked eye. High levels of dust mites have been detected from mattresses, pillows, carpets, upholstered furniture, bed covers, clothes, soft toys and any woven material. The principal allergen of the dust mite is found in its feces. A gram of dust may contain 1,000 mites and 250,000 fecal particles. Mite antigen is easily measured in the air during house cleaning activities. Dust mites do not bite and do not cause harm to humans, other than by triggering allergies/asthma.  Ways to decrease your exposure to dust mites in your home:  1. Encase mattresses, box springs and pillows with a mite-impermeable barrier or cover  2. Wash sheets, blankets and drapes weekly in hot water (130 F) with detergent and dry them in a dryer on the hot setting.  3. Have the room cleaned frequently with a vacuum cleaner and a damp dust-mop. For carpeting or rugs, vacuuming with a vacuum cleaner equipped with a high-efficiency particulate air (HEPA) filter. The dust mite allergic individual should not be in a room which is being cleaned  and should wait 1 hour after cleaning before going into the room.  4. Do not sleep on upholstered furniture (eg, couches).  5. If possible removing carpeting, upholstered furniture and drapery from the home is ideal. Horizontal blinds should be eliminated in the rooms where the person spends the most time (bedroom, study, television room). Washable vinyl, roller-type shades are optimal.  6. Remove all non-washable stuffed toys from the bedroom. Wash stuffed toys weekly like sheets and blankets above.  7. Reduce indoor humidity to less than 50%. Inexpensive humidity monitors can be purchased at most hardware stores. Do not use a humidifier as can make the problem worse and are not recommended.

## 2023-11-16 ENCOUNTER — Ambulatory Visit

## 2023-11-23 ENCOUNTER — Ambulatory Visit

## 2023-11-23 DIAGNOSIS — L501 Idiopathic urticaria: Secondary | ICD-10-CM

## 2023-11-29 ENCOUNTER — Encounter: Payer: Self-pay | Admitting: Internal Medicine

## 2023-11-29 ENCOUNTER — Ambulatory Visit: Admitting: Internal Medicine

## 2023-11-29 ENCOUNTER — Telehealth: Payer: Self-pay | Admitting: Internal Medicine

## 2023-11-29 VITALS — BP 128/80 | HR 57 | Temp 98.1°F | Ht 60.0 in | Wt 253.8 lb

## 2023-11-29 DIAGNOSIS — G4733 Obstructive sleep apnea (adult) (pediatric): Secondary | ICD-10-CM | POA: Diagnosis not present

## 2023-11-29 DIAGNOSIS — Z87891 Personal history of nicotine dependence: Secondary | ICD-10-CM | POA: Insufficient documentation

## 2023-11-29 DIAGNOSIS — N1831 Chronic kidney disease, stage 3a: Secondary | ICD-10-CM

## 2023-11-29 DIAGNOSIS — E559 Vitamin D deficiency, unspecified: Secondary | ICD-10-CM

## 2023-11-29 DIAGNOSIS — E785 Hyperlipidemia, unspecified: Secondary | ICD-10-CM

## 2023-11-29 DIAGNOSIS — Z1231 Encounter for screening mammogram for malignant neoplasm of breast: Secondary | ICD-10-CM

## 2023-11-29 DIAGNOSIS — I700 Atherosclerosis of aorta: Secondary | ICD-10-CM

## 2023-11-29 DIAGNOSIS — R748 Abnormal levels of other serum enzymes: Secondary | ICD-10-CM

## 2023-11-29 DIAGNOSIS — F17211 Nicotine dependence, cigarettes, in remission: Secondary | ICD-10-CM

## 2023-11-29 DIAGNOSIS — B353 Tinea pedis: Secondary | ICD-10-CM

## 2023-11-29 MED ORDER — CLOTRIMAZOLE 1 % EX OINT: TOPICAL_OINTMENT | CUTANEOUS | 0 refills | Status: DC

## 2023-11-29 NOTE — Telephone Encounter (Signed)
 Copied from CRM [id number]. Topic: Clinical - Prescription Issue >> Nov 29, 2023  4:45 PM Mercer PEDLAR wrote: Reason for CRM: Ronal from [facility] pharmacy called stating that Clotrimazole  1 % OINT is not available as an ointment only as a cream.

## 2023-11-29 NOTE — Patient Instructions (Addendum)
 It was wonderful to see you today!  Please send us  or bring in a copy of the letter from your insurance company regarding the medication Zepbound .   I will give you a call early next week with lab results.  *For exercise classes (in-person and online), group activities, and more* (50+)            [email or web address]  Please contact your pharmacy about scheduling the shingles (Shingrix ) vaccine.

## 2023-11-29 NOTE — Assessment & Plan Note (Signed)
 Noted on prior imaging. Not currently on statin therapy as outlined elsewhere. Repeat lipid panel pending.  Plan -Lipid -Continue healthy lifestyle changes

## 2023-11-29 NOTE — Assessment & Plan Note (Addendum)
 Mild elevation on prior labs. Repeat today.  Plan -CMP  Addendum 8/13: -GGT added to yesterday's labs

## 2023-11-29 NOTE — Assessment & Plan Note (Signed)
 Itching and scaling of the right foot which improved somewhat with previous clotrimazole  therapy. On examination, there is scale over the bilateral edges of the right foot as well as scale and maceration between the toes, c/f tinea pedis. Additional two weeks of clotrimazole  ointment sent to pharmacy. If this does not resolve, I have asked Maria Nelson to call.

## 2023-11-29 NOTE — Assessment & Plan Note (Signed)
 Elevated cholesterol at last visit. Maria Nelson elected to trial lifestyle interventions rather than alternative lipid-lowering agent. Previously noted epigastric discomfort with atorvastatin . We discussed rechecking today and discussing pharmacologic agents as indicated.  Plan -recheck lipid panel, likely discuss alternative statin vs ezetimibe

## 2023-11-29 NOTE — Assessment & Plan Note (Signed)
 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 Due for q34m BMP.  Plan -BMP

## 2023-11-29 NOTE — Assessment & Plan Note (Signed)
 History of vitamin d  deficiency. Not taking supplementation for the past month. Recommended 1000 international units  daily which Ms. Mogensen reports she will obtain OTC at lower cost.    Plan -Recheck vit D -Continue 1000 international units  daily

## 2023-11-29 NOTE — Progress Notes (Addendum)
 Established Patient Office Visit  Subjective   Patient ID: Maria Nelson, female    DOB: 1955/11/20  Age: 68 y.o. MRN: [id number]  Chief Complaint  Patient presents with   Medical Management of Chronic Issues    Maria Nelson returns to clinic today for f/u of chronic medical conditions. Please see assessment/plan in problem-based charting for further details of today's visit.     Patient Active Problem List   Diagnosis Date Noted   Former smoker 11/29/2023   Gastroesophageal reflux disease 05/16/2023   Vitamin D  deficiency 05/03/2023   Pulmonary hypertension, unspecified (HCC) 10/04/2022   Alkaline phosphatase raised 10/04/2022   Aortic atherosclerosis (HCC) 07/06/2022   Tinea pedis of right foot 07/06/2022   Tubular adenoma of colon 07/06/2022   Stage 3a chronic kidney disease (HCC) 03/15/2022   Benign paroxysmal positional vertigo 09/03/2021   Healthcare maintenance 01/28/2021   Hyperlipidemia 11/22/2019   Paroxysmal atrial fibrillation (HCC) 08/24/2017   Back pain 06/23/2016   Seasonal and perennial allergic rhinitis 03/04/2015   Asthma with COPD (HCC) 06/19/2014   HNP (herniated nucleus pulposus), lumbar 04/16/2013   Tenosynovitis, de Quervain 11/15/2012   Morbid obesity with body mass index (BMI) of 50.0 to 59.9 in adult (HCC) 09/08/2012   Carpal tunnel syndrome 06/23/2011   Shoulder pain 09/02/2010   OSA (obstructive sleep apnea) 09/02/2010   Chronic urticaria 12/16/2009   STRESS INCONTINENCE 01/24/2007   History of peptic ulcer disease 07/26/2006     Objective:     BP 128/80 (BP Location: Left Arm, Patient Position: Sitting, Cuff Size: Large)   Pulse (!) 57   Temp 98.1 F (36.7 C) (Oral)   Ht 5' (1.524 m)   Wt 253 lb 12.8 oz (115.1 kg)   LMP 04/19/2004 (Approximate)   SpO2 97%   BMI 49.57 kg/m  BP Readings from Last 3 Encounters:  11/29/23 128/80  11/14/23 124/78  05/16/23 112/78   Wt Readings from Last 3 Encounters:  11/29/23  253 lb 12.8 oz (115.1 kg)  11/14/23 254 lb 11.2 oz (115.5 kg)  10/26/23 255 lb (115.7 kg)     Physical Exam Constitutional:      Appearance: Normal appearance.  Cardiovascular:     Rate and Rhythm: Normal rate and regular rhythm.     Heart sounds: Normal heart sounds.  Pulmonary:     Effort: Pulmonary effort is normal.     Breath sounds: Normal breath sounds.  Musculoskeletal:        General: No swelling.  Skin:    General: Skin is warm and dry.     Comments: Scaling and peeling of right plantar foot and between toes; left foot clear  Neurological:     General: No focal deficit present.     Mental Status: She is alert.  Psychiatric:        Mood and Affect: Mood normal.        Behavior: Behavior normal.      Assessment & Plan:   Problem List Items Addressed This Visit       Cardiovascular and Mediastinum   Aortic atherosclerosis (HCC)   Noted on prior imaging. Not currently on statin therapy as outlined elsewhere. Repeat lipid panel pending.  Plan -Lipid -Continue healthy lifestyle changes      Relevant Orders   Lipid panel (Completed)     Respiratory   OSA (obstructive sleep apnea)   Discussed starting Zepbound  at previous visit for OSA, intolerant of CPAP and not a candidate for Inspire.  PA looks like it was approved, however Maria Nelson states she got a letter from her insurance and was not able to start the medication. She will try to get the paper to me for review. Continue to recommend this medication for OSA and morbid obesity.  Addendum 8/20 Patient brought in insurance denial form for review. This was actually for Wegovy  which was prescribed in 2024. Zepbound  [place] approved 04/2023. Called [facility] pharmacy and Zepbound  is available for pickup by the patient. Maria Nelson was informed and reminded of administration instructions.         Musculoskeletal and Integument   Tinea pedis of right foot   Itching and scaling of the right foot which improved  somewhat with previous clotrimazole  therapy. On examination, there is scale over the bilateral edges of the right foot as well as scale and maceration between the toes, c/f tinea pedis. Additional two weeks of clotrimazole  ointment sent to pharmacy. If this does not resolve, I have asked Maria Nelson to call.       Relevant Medications   Clotrimazole  1 % OINT     Genitourinary   Stage 3a chronic kidney disease (HCC) - Primary   CKD3a, no albuminuria. Due for q10m BMP.  Plan -BMP      Relevant Orders   Comprehensive metabolic panel with GFR (Completed)     Other   Hyperlipidemia   Elevated cholesterol at last visit. Maria Nelson elected to trial lifestyle interventions rather than alternative lipid-lowering agent. Previously noted epigastric discomfort with atorvastatin . We discussed rechecking today and discussing pharmacologic agents as indicated.  Plan -recheck lipid panel, likely discuss alternative statin vs ezetimibe      Relevant Orders   Lipid panel (Completed)   Alkaline phosphatase raised   Mild elevation on prior labs. Repeat today.  Plan -CMP  Addendum 8/13: -GGT added to yesterday's labs      Relevant Orders   Comprehensive metabolic panel with GFR (Completed)   VITAMIN D  25 Hydroxy (Vit-D Deficiency, Fractures) (Completed)   Gamma GT, GGT (17022)   Vitamin D  deficiency   History of vitamin d  deficiency. Not taking supplementation for the past month. Recommended 1000 international units  daily which Maria Nelson reports she will obtain OTC at lower cost.    Plan -Recheck vit D -Continue 1000 international units  daily      Relevant Orders   VITAMIN D  25 Hydroxy (Vit-D Deficiency, Fractures) (Completed)   Former smoker   Former smoker, approximately 20 packs years and quit in 2012. Due for annual lung cancer screening to which she is agreeable today. Referral placed.      Relevant Orders   CT CHEST LUNG CA SCREEN LOW DOSE W/O CM   Other Visit  Diagnoses       Screening mammogram for breast cancer           Return in about 6 months (around 05/31/2024).    Ronnald Sergeant, MD

## 2023-11-29 NOTE — Assessment & Plan Note (Signed)
 Former smoker, approximately 20 packs years and quit in 2012. Due for annual lung cancer screening to which she is agreeable today. Referral placed.

## 2023-11-29 NOTE — Assessment & Plan Note (Signed)
 Discussed starting Zepbound  at previous visit for OSA, intolerant of CPAP and not a candidate for Inspire. PA looks like it was approved, however Ms. Ford states she got a letter from her insurance and was not able to start the medication. She will try to get the paper to me for review. Continue to recommend this medication for OSA and morbid obesity.  Addendum 8/20 Patient brought in insurance denial form for review. This was actually for Wegovy  which was prescribed in 2024. Zepbound  PA approved 04/2023. Called [facility] and Zepbound  is available for pickup by the patient. Ms. Sopher was informed and reminded of administration instructions.

## 2023-11-30 LAB — COMPREHENSIVE METABOLIC PANEL WITH GFR
ALT: 11 IU/L (ref 0–32)
AST: 17 IU/L (ref 0–40)
Albumin: 4 g/dL (ref 3.9–4.9)
Alkaline Phosphatase: 122 IU/L — ABNORMAL HIGH (ref 44–121)
BUN/Creatinine Ratio: 11 — ABNORMAL LOW (ref 12–28)
BUN: 12 mg/dL (ref 8–27)
Bilirubin Total: 0.3 mg/dL (ref 0.0–1.2)
CO2: 21 mmol/L (ref 20–29)
Calcium: 10 mg/dL (ref 8.7–10.3)
Chloride: 104 mmol/L (ref 96–106)
Creatinine, Ser: 1.13 mg/dL — ABNORMAL HIGH (ref 0.57–1.00)
Globulin, Total: 2.9 g/dL (ref 1.5–4.5)
Glucose: 73 mg/dL (ref 70–99)
Potassium: 4.4 mmol/L (ref 3.5–5.2)
Sodium: 141 mmol/L (ref 134–144)
Total Protein: 6.9 g/dL (ref 6.0–8.5)
eGFR: 53 mL/min/1.73 — ABNORMAL LOW (ref >59)

## 2023-11-30 LAB — VITAMIN D 25 HYDROXY (VIT D DEFICIENCY, FRACTURES): Vit D, 25-Hydroxy: 17.2 ng/mL — ABNORMAL LOW (ref 30.0–100.0)

## 2023-11-30 LAB — LIPID PANEL
Chol/HDL Ratio: 3.9 ratio (ref 0.0–4.4)
Cholesterol, Total: 209 mg/dL — ABNORMAL HIGH (ref 100–199)
HDL: 54 mg/dL (ref >39)
LDL Chol Calc (NIH): 140 mg/dL — ABNORMAL HIGH (ref 0–99)
Triglycerides: 85 mg/dL (ref 0–149)
VLDL Cholesterol Cal: 15 mg/dL (ref 5–40)

## 2023-11-30 NOTE — Addendum Note (Signed)
 Addended by: KARNA FELLOWS on: 11/30/2023 08:58 AM   Modules accepted: Orders

## 2023-11-30 NOTE — Telephone Encounter (Signed)
 Call to [facility] order given to change to [facility] with same directions.

## 2023-12-01 LAB — GAMMA GT: GGT: 10 IU/L (ref 0–60)

## 2023-12-01 LAB — SPECIMEN STATUS REPORT

## 2023-12-02 ENCOUNTER — Other Ambulatory Visit: Payer: Self-pay | Admitting: Internal Medicine

## 2023-12-02 DIAGNOSIS — Z1231 Encounter for screening mammogram for malignant neoplasm of breast: Secondary | ICD-10-CM

## 2023-12-06 ENCOUNTER — Ambulatory Visit: Payer: Self-pay | Admitting: Internal Medicine

## 2023-12-06 DIAGNOSIS — E785 Hyperlipidemia, unspecified: Secondary | ICD-10-CM

## 2023-12-06 DIAGNOSIS — E559 Vitamin D deficiency, unspecified: Secondary | ICD-10-CM

## 2023-12-06 MED ORDER — ROSUVASTATIN CALCIUM 5 MG PO TABS: 5.0000 mg | ORAL_TABLET | Freq: Every day | ORAL | 11 refills | Status: AC

## 2023-12-06 MED ORDER — VITAMIN D (CHOLECALCIFEROL) 25 MCG (1000 UT) PO CAPS: 1.0000 | ORAL_CAPSULE | Freq: Every day | ORAL | 3 refills | Status: AC

## 2023-12-06 NOTE — Progress Notes (Signed)
 Vitamin D  low. Resume 1000IU daily. Discussed with patient 8/19.

## 2023-12-06 NOTE — Progress Notes (Signed)
 CKD3a. Mild elevation in ALP, normal GGT. Continue to monitor, replete vitamin D . Discussed with patient 8/19.

## 2023-12-07 ENCOUNTER — Ambulatory Visit
Admission: RE | Admit: 2023-12-07 | Discharge: 2023-12-07 | Disposition: A | Discharge status: Home / Self Care | Source: Ambulatory Visit | Attending: Internal Medicine | Admitting: Internal Medicine

## 2023-12-07 DIAGNOSIS — Z87891 Personal history of nicotine dependence: Secondary | ICD-10-CM

## 2023-12-21 ENCOUNTER — Ambulatory Visit

## 2023-12-21 DIAGNOSIS — L501 Idiopathic urticaria: Secondary | ICD-10-CM | POA: Diagnosis not present

## 2023-12-22 NOTE — Progress Notes (Signed)
 Benign appearing nodules, repeat lung cancer screening in one year. Reviewed with Ms. Rocca 12/22/23.

## 2023-12-27 ENCOUNTER — Ambulatory Visit: Admitting: Internal Medicine

## 2024-01-09 ENCOUNTER — Ambulatory Visit
Admission: RE | Admit: 2024-01-09 | Discharge: 2024-01-09 | Disposition: A | Discharge status: Home / Self Care | Source: Ambulatory Visit | Attending: Internal Medicine | Admitting: Internal Medicine

## 2024-01-09 DIAGNOSIS — Z1231 Encounter for screening mammogram for malignant neoplasm of breast: Secondary | ICD-10-CM

## 2024-01-11 ENCOUNTER — Telehealth: Payer: Self-pay | Admitting: *Deleted

## 2024-01-11 MED ORDER — TIRZEPATIDE-WEIGHT MANAGEMENT 5 MG/0.5ML ~~LOC~~ SOAJ: 5.0000 mg | SUBCUTANEOUS | 1 refills | Status: DC

## 2024-01-11 NOTE — Telephone Encounter (Signed)
 RTC to patient  Message left to advise patient to increase her Zepbound  to 5 mg weekly for 1 month.  Make an appointment to come in a month for an appointment.

## 2024-01-11 NOTE — Telephone Encounter (Addendum)
 Increase to 5 mg weekly for one month. Please schedule appointment in one month for review.

## 2024-01-11 NOTE — Telephone Encounter (Signed)
 RTC to patient states is doing ok with the present dosing of the Zepbound  and is ready to go upon the dosing.  Copied from CRM #[id number]. Topic: Clinical - Medical Advice >> Jan 11, 2024 12:28 PM Diannia H wrote: Reason for CRM: Patient is calling because she has done her first month of Zepbound  and wants to know will provider continue the medicine or something else. Could you assist? Patients callback number is [phone].

## 2024-01-18 ENCOUNTER — Ambulatory Visit (INDEPENDENT_AMBULATORY_CARE_PROVIDER_SITE_OTHER)

## 2024-01-18 DIAGNOSIS — L501 Idiopathic urticaria: Secondary | ICD-10-CM

## 2024-02-14 ENCOUNTER — Other Ambulatory Visit: Payer: Self-pay | Admitting: Internal Medicine

## 2024-02-15 ENCOUNTER — Ambulatory Visit (INDEPENDENT_AMBULATORY_CARE_PROVIDER_SITE_OTHER)

## 2024-02-15 DIAGNOSIS — L501 Idiopathic urticaria: Secondary | ICD-10-CM | POA: Diagnosis not present

## 2024-02-21 ENCOUNTER — Encounter: Payer: Self-pay | Admitting: Internal Medicine

## 2024-02-21 ENCOUNTER — Ambulatory Visit: Payer: Self-pay | Admitting: Internal Medicine

## 2024-02-21 VITALS — BP 105/55 | HR 62 | Temp 97.8°F | Ht 60.0 in | Wt 245.4 lb

## 2024-02-21 DIAGNOSIS — G4733 Obstructive sleep apnea (adult) (pediatric): Secondary | ICD-10-CM

## 2024-02-21 DIAGNOSIS — Z23 Encounter for immunization: Secondary | ICD-10-CM | POA: Diagnosis not present

## 2024-02-21 DIAGNOSIS — E785 Hyperlipidemia, unspecified: Secondary | ICD-10-CM

## 2024-02-21 MED ORDER — TIRZEPATIDE 10 MG/0.5ML ~~LOC~~ SOAJ: 10.0000 mg | SUBCUTANEOUS | 0 refills | Status: AC

## 2024-02-21 MED ORDER — TIRZEPATIDE 7.5 MG/0.5ML ~~LOC~~ SOAJ: 7.5000 mg | SUBCUTANEOUS | 0 refills | Status: DC

## 2024-02-21 NOTE — Progress Notes (Signed)
 This is a Psychologist, Occupational Note.  The care of the patient was discussed with Dr. Karna and the assessment and plan was formulated with their assistance.  Please see their note for official documentation of the patient encounter.   Subjective:   Patient ID: Maria Nelson female   DOB: December 29, 1955 68 y.o.   MRN: [id number]  HPI: [name] is a 68 y.o. female with pmh of OSA, hyperlipidemia, and low vitamin d  who presents to the clinic today for assessment after starting Zepbound  for OSA and weight loss. Patient overall doing well today. Patient reports not feeling hungry and not eating a lot since starting Zepbound . Patient reports cutting back on nighttime sweets. She feels like she gets a goodnight's rest, is not tired throughout the day, and breathing is generally better. Patient feels like she also lost weight. Patient reports no side effects from Zepbound  such as abdominal pain, nausea, vomiting, diarrhea, constipation.     Past Medical History:  Diagnosis Date   Acute pain of right knee 01/26/2023   Adnexal pain 09/06/2018   ANGIOEDEMA 12/16/2009   Asthma    Blood pressure elevated without history of HTN 03/05/2014   Carpal tunnel syndrome 06/23/2011   Patient notes history of bilateral carpal tunnel syndrome.  Now has symptoms of right hand carpal tunnel.    Chronic pain of both knees 08/16/2016   COPD (chronic obstructive pulmonary disease) (HCC)    secondary to tobacco use // No PFTs on file   COVID-19    Dyspepsia 08/24/2017   Emphysema of lung (HCC)    not on O2 at home (09/08/2022)   Encounter for screening mammogram for malignant neoplasm of breast 07/06/2022   ETOH abuse    Pt stopped in 3/08   Gastrointestinal hemorrhage    secondary to PUD on March 2008   GERD (gastroesophageal reflux disease)    on meds   Low iron 12/03/2021   OSA on CPAP    noncompliant with CPAP (2/2 it being depressing)   Other fatigue 07/06/2022   Peptic  ulcer disease    +H. pylori (antigen)- Dr. Obie- treated   Pulmonary nodule less than 6 mm determined by computed tomography of lung 12/03/2021   STRESS INCONTINENCE 01/24/2007   Tobacco abuse    quit in 2010   Urinary incontinence    Urticaria    Urticaria 07/01/2015   Current Outpatient Medications  Medication Sig Dispense Refill   [START ON 03/20/2024] tirzepatide  (MOUNJARO ) 10 MG/0.5ML Pen Inject 10 mg into the skin once a week. 2 mL 0   tirzepatide  (MOUNJARO ) 7.5 MG/0.5ML Pen Inject 7.5 mg into the skin once a week. 2 mL 0   acetaminophen  (TYLENOL ) 500 MG tablet Take 2 tablets (1,000 mg total) by mouth every 6 (six) hours as needed.     albuterol  (PROVENTIL ) (2.5 MG/3ML) 0.083% nebulizer solution Use 1 vial in nebulizer up to 3 times a day when albuterol  inhaler not working. If no better or getting worse, call 911 or physician line. 75 mL 3   albuterol  (VENTOLIN  HFA) 108 (90 Base) MCG/ACT inhaler INHALE 2 PUFFS BY MOUTH EVERY 6 HOURS AS NEEDED FOR WHEEZING FOR SHORTNESS OF BREATH 18 g 1   BREZTRI  AEROSPHERE 160-9-4.8 MCG/ACT AERO inhaler Inhale 2 puffs into the lungs in the morning and at bedtime. 10.7 g 5   Clotrimazole  1 % OINT Apply to right foot and between right toes twice daily for two weeks. 56.7 g 0   EPINEPHrine  (  EPIPEN  2-PAK) 0.3 mg/0.3 mL IJ SOAJ injection Inject 0.3 mg into the muscle as needed (for allergic reaction). 1 each 1   pantoprazole  (PROTONIX ) 40 MG tablet Take 1 tablet (40 mg total) by mouth daily. 90 tablet 1   rosuvastatin  (CRESTOR ) 5 MG tablet Take 1 tablet (5 mg total) by mouth daily. 30 tablet 11   tirzepatide  (ZEPBOUND ) 5 MG/0.5ML Pen Inject 5 mg into the skin once a week. 2 mL 1   Vitamin D , Cholecalciferol , 25 MCG (1000 UT) CAPS Take 1 capsule by mouth daily. 90 capsule 3   XOLAIR  150 MG/ML prefilled syringe INJECT 300MG  SUBCUTANEOUSLY  EVERY 4 WEEKS 2 mL 11   Current Facility-Administered Medications  Medication Dose Route Frequency Provider Last Rate  Last Admin   omalizumab  (XOLAIR ) prefilled syringe 300 mg  300 mg Subcutaneous Q28 days Cari Arlean HERO, FNP   300 mg at 02/15/24 1400   Family History  Problem Relation Age of Onset   Diabetes Mother    Coronary artery disease Mother 61       requiring 5 vessel CABG   Diabetes Sister    Breast cancer Sister 58       died 2/2 breast ca age 16-60   Colon polyps Neg Hx    Colon cancer Neg Hx    Esophageal cancer Neg Hx    Rectal cancer Neg Hx    Stomach cancer Neg Hx    Social History   Socioeconomic History   Marital status: Single    Spouse name: Not on file   Number of children: 3   Years of education: 12th grade   Highest education level: Not on file  Occupational History   Occupation: Disability    Comment: previously worked in [employer] had to stop 2/2 occupational exposures leading to exacerbations of asthma   Tobacco Use   Smoking status: Former    Current packs/day: 0.00    Average packs/day: 0.5 packs/day for 40.0 years (20.0 ttl pk-yrs)    Types: Cigarettes    Start date: 12/04/1970    Quit date: 12/04/2010    Years since quitting: 13.2    Passive exposure: Never   Smokeless tobacco: Never  Vaping Use   Vaping status: Never Used  Substance and Sexual Activity   Alcohol use: Not Currently   Drug use: No   Sexual activity: Not Currently  Other Topics Concern   Not on file  Social History Narrative   Pt is not working now, was previously a conservation officer, nature at [employer].      Pt lives with her son      Pt has received financial assistance approval for 100% discount at [facility] and has [facility] card.    Social Drivers of Corporate Investment Banker Strain: Low Risk  (10/26/2023)   Overall Financial Resource Strain (CARDIA)    Difficulty of Paying Living Expenses: Not hard at all  Food Insecurity: No Food Insecurity (10/26/2023)   Hunger Vital Sign    Worried About Running Out of Food in the Last Year: Never true    Ran Out of Food in the Last Year: Never true   Transportation Needs: No Transportation Needs (10/26/2023)   PRAPARE - Administrator, Civil Service (Medical): No    Lack of Transportation (Non-Medical): No  Physical Activity: Sufficiently Active (10/26/2023)   Exercise Vital Sign    Days of Exercise per Week: 5 days    Minutes of Exercise per  Session: 60 min  Stress: No Stress Concern Present (10/26/2023)   [employer] of Occupational Health - Occupational Stress Questionnaire    Feeling of Stress: Not at all  Social Connections: Moderately Isolated (10/26/2023)   Social Connection and Isolation Panel    Frequency of Communication with Friends and Family: More than three times a week    Frequency of Social Gatherings with Friends and Family: Twice a week    Attends Religious Services: 1 to 4 times per year    Active Member of [employer] or Organizations: No    Attends Banker Meetings: Never    Marital Status: Divorced   Review of Systems: Pertinent items are noted in HPI. Objective:  Physical Exam: Vitals:   02/21/24 0918  BP: (!) 105/55  Pulse: 62  Temp: 97.8 F (36.6 C)  TempSrc: Oral  SpO2: 96%  Weight: 245 lb 6.4 oz (111.3 kg)  Height: 5' (1.524 m)   BP (!) 105/55 (BP Location: Left Arm, Patient Position: Sitting, Cuff Size: Large)   Pulse 62   Temp 97.8 F (36.6 C) (Oral)   Ht 5' (1.524 m)   Wt 245 lb 6.4 oz (111.3 kg)   LMP 04/19/2004 (Approximate)   SpO2 96% Comment: RA  BMI 47.93 kg/m   General Appearance:    Alert, cooperative, no distress, appears stated age  Head:    Normocephalic, without obvious abnormality, atraumatic  Lungs:     Clear to auscultation bilaterally, respirations unlabored   Heart:    Regular rate and rhythm, S1 and S2 normal, no murmur, rub   or gallop   Assessment & Plan:   Assessment & Plan Hyperlipidemia, unspecified hyperlipidemia type Patient recently started rosuvastatin  5 mg and has not had any issues with this medication. Will continue on rosuvastatin   5 mg daily. Lipid panel pending.   Orders:   Lipid Profile  OSA (obstructive sleep apnea) Patient overall feels better. Reports getting good night rest, not being tired throughout the day, and is breathing better in general. Patient was 253 pounds in August, and is now 245 pounds (11/4). Patient reports no side effects from Zepbound  such as abdominal pain, nausea, vomiting, diarrhea, constipation.   Plan:  -increase Zepbound  to 7.5 mg weekly, then 10 mg in one month for further weight control if tolerable   Orders:   tirzepatide  (MOUNJARO ) 7.5 MG/0.5ML Pen; Inject 7.5 mg into the skin once a week.   tirzepatide  (MOUNJARO ) 10 MG/0.5ML Pen; Inject 10 mg into the skin once a week.  Encounter for immunization Flu vaccine received today.   Orders:   Flu vaccine HIGH DOSE PF(Fluzone Trivalent)   Discussed and evaluated with Dr. Karna and discussed plan with Dr. Karna.    Cozetta Pereyra, MS3

## 2024-02-21 NOTE — Progress Notes (Signed)
 Attestation for Student Documentation:  I personally was present and re-performed the history, physical exam and medical decision-making activities of this service and have verified that the service and findings are accurately documented in the student's note.  Karna Fellows, MD 02/21/2024, 10:33 AM

## 2024-02-21 NOTE — Patient Instructions (Signed)
 It was wonderful to see you today!  Increase Zepbound  to 7.5 mg weekly for 4 weeks, then increase to 10 mg weekly. If you notice any side effects from the increased dose, please let me know.  I'll call you with lab results when available.  *For exercise classes (in-person and online), group activities, and more* (50+)            [email or web address]

## 2024-02-21 NOTE — Assessment & Plan Note (Signed)
 Patient overall feels better. Reports getting good night rest, not being tired throughout the day, and is breathing better in general. Patient was 253 pounds in August, and is now 245 pounds (11/4). Patient reports no side effects from Zepbound  such as abdominal pain, nausea, vomiting, diarrhea, constipation.   Plan:  -increase Zepbound  to 7.5 mg weekly, then 10 mg in one month for further weight control if tolerable   Orders:   tirzepatide  (MOUNJARO ) 7.5 MG/0.5ML Pen; Inject 7.5 mg into the skin once a week.   tirzepatide  (MOUNJARO ) 10 MG/0.5ML Pen; Inject 10 mg into the skin once a week.

## 2024-02-21 NOTE — Assessment & Plan Note (Signed)
 Patient recently started rosuvastatin  5 mg and has not had any issues with this medication. Will continue on rosuvastatin  5 mg daily. Lipid panel pending.   Orders:   Lipid Profile

## 2024-02-22 ENCOUNTER — Ambulatory Visit: Payer: Self-pay | Admitting: Internal Medicine

## 2024-02-22 LAB — LIPID PANEL
Chol/HDL Ratio: 2.7 ratio (ref 0.0–4.4)
Cholesterol, Total: 149 mg/dL (ref 100–199)
HDL: 56 mg/dL (ref >39)
LDL Chol Calc (NIH): 77 mg/dL (ref 0–99)
Triglycerides: 87 mg/dL (ref 0–149)
VLDL Cholesterol Cal: 16 mg/dL (ref 5–40)

## 2024-02-22 NOTE — Progress Notes (Signed)
 Much improved with rosuvastatin  5 mg daily. Tolerating well. Continue. Reviewed with Ms. Kilfoyle 11/5.

## 2024-03-07 ENCOUNTER — Other Ambulatory Visit: Payer: Self-pay | Admitting: Internal Medicine

## 2024-03-07 NOTE — Telephone Encounter (Signed)
 Patient no longer on 5 mg. Rx for 7.5 mg was sent 02/21/24.

## 2024-03-11 ENCOUNTER — Other Ambulatory Visit: Payer: Self-pay

## 2024-03-11 ENCOUNTER — Ambulatory Visit: Admitting: Radiology

## 2024-03-11 ENCOUNTER — Ambulatory Visit
Admission: RE | Admit: 2024-03-11 | Discharge: 2024-03-11 | Disposition: A | Discharge status: Home / Self Care | Source: Ambulatory Visit | Attending: Emergency Medicine | Admitting: Emergency Medicine

## 2024-03-11 VITALS — BP 124/81 | HR 86 | Temp 98.2°F | Resp 18

## 2024-03-11 DIAGNOSIS — R112 Nausea with vomiting, unspecified: Secondary | ICD-10-CM | POA: Diagnosis not present

## 2024-03-11 DIAGNOSIS — R197 Diarrhea, unspecified: Secondary | ICD-10-CM | POA: Diagnosis not present

## 2024-03-11 DIAGNOSIS — R051 Acute cough: Secondary | ICD-10-CM

## 2024-03-11 DIAGNOSIS — J441 Chronic obstructive pulmonary disease with (acute) exacerbation: Secondary | ICD-10-CM | POA: Diagnosis not present

## 2024-03-11 MED ORDER — ONDANSETRON 4 MG PO TBDP: 4.0000 mg | ORAL_TABLET | Freq: Three times a day (TID) | ORAL | 0 refills | Status: AC | PRN

## 2024-03-11 MED ORDER — DEXAMETHASONE SOD PHOSPHATE PF 10 MG/ML IJ SOLN
10.0000 mg | Freq: Once | INTRAMUSCULAR | Status: AC
  Administered 2024-03-11: 10 mg via INTRAMUSCULAR

## 2024-03-11 MED ORDER — ONDANSETRON 4 MG PO TBDP
4.0000 mg | ORAL_TABLET | Freq: Once | ORAL | Status: AC
  Administered 2024-03-11: 4 mg via ORAL

## 2024-03-11 MED ORDER — KETOROLAC TROMETHAMINE 30 MG/ML IJ SOLN
15.0000 mg | Freq: Once | INTRAMUSCULAR | Status: AC
  Administered 2024-03-11: 15 mg via INTRAMUSCULAR

## 2024-03-11 MED ORDER — AZITHROMYCIN 250 MG PO TABS: 250.0000 mg | ORAL_TABLET | Freq: Every day | ORAL | 0 refills | Status: DC

## 2024-03-11 NOTE — ED Triage Notes (Addendum)
 Pt c/o cough, congestion, throwing up, nausea, diarrhea and chest discomfort with cough since Wednesday.

## 2024-03-11 NOTE — ED Provider Notes (Signed)
 [facility]    CSN: [id number] Arrival date & time: 03/11/24  0903      History   Chief Complaint Chief Complaint  Patient presents with   Diarrhea    Coughing, chest pain - Entered by patient    HPI Maria Nelson is a 68 y.o. female.   Patient brought into clinic by daughter over concern of nausea, vomiting, diarrhea, cough, wheezing and shortness of breath.  Symptoms started 5 days ago.  Wednesday patient was not feeling well so she took an Alka-Seltzer.  Thursday patient woke up feeling worse and had emesis and diarrhea.  Reports she has not been able to hold down solid food since Thursday, 4 days ago.  Harsh, dry, barking cough. Central chest pains when coughing.  Patient does have a history of asthma and COPD, typically well-controlled.  No recent exacerbations.  Unsure of fever.  Did have streaks of blood in the diarrhea or emesis, she is unsure which a few days ago.  The history is provided by the patient and medical records.  Diarrhea   Past Medical History:  Diagnosis Date   Acute pain of right knee 01/26/2023   Adnexal pain 09/06/2018   ANGIOEDEMA 12/16/2009   Asthma    Blood pressure elevated without history of HTN 03/05/2014   Carpal tunnel syndrome 06/23/2011   Patient notes history of bilateral carpal tunnel syndrome.  Now has symptoms of right hand carpal tunnel.    Chronic pain of both knees 08/16/2016   COPD (chronic obstructive pulmonary disease) (HCC)    secondary to tobacco use // No PFTs on file   COVID-19    Dyspepsia 08/24/2017   Emphysema of lung (HCC)    not on O2 at home (09/08/2022)   Encounter for screening mammogram for malignant neoplasm of breast 07/06/2022   ETOH abuse    Pt stopped in 3/08   Gastrointestinal hemorrhage    secondary to PUD on March 2008   GERD (gastroesophageal reflux disease)    on meds   Low iron 12/03/2021   OSA on CPAP    noncompliant with CPAP (2/2 it being depressing)   Other  fatigue 07/06/2022   Peptic ulcer disease    +H. pylori (antigen)- Dr. Obie- treated   Pulmonary nodule less than 6 mm determined by computed tomography of lung 12/03/2021   STRESS INCONTINENCE 01/24/2007   Tobacco abuse    quit in 2010   Urinary incontinence    Urticaria    Urticaria 07/01/2015    Patient Active Problem List   Diagnosis Date Noted   Former smoker 11/29/2023   Gastroesophageal reflux disease 05/16/2023   Vitamin D  deficiency 05/03/2023   Pulmonary hypertension, unspecified (HCC) 10/04/2022   Alkaline phosphatase raised 10/04/2022   Aortic atherosclerosis 07/06/2022   Tinea pedis of right foot 07/06/2022   Tubular adenoma of colon 07/06/2022   Stage 3a chronic kidney disease (HCC) 03/15/2022   Benign paroxysmal positional vertigo 09/03/2021   Healthcare maintenance 01/28/2021   Hyperlipidemia 11/22/2019   Paroxysmal atrial fibrillation (HCC) 08/24/2017   Back pain 06/23/2016   Seasonal and perennial allergic rhinitis 03/04/2015   Asthma with COPD (HCC) 06/19/2014   HNP (herniated nucleus pulposus), lumbar 04/16/2013   Tenosynovitis, de Quervain 11/15/2012   Morbid obesity with body mass index (BMI) of 50.0 to 59.9 in adult (HCC) 09/08/2012   Carpal tunnel syndrome 06/23/2011   Shoulder pain 09/02/2010   OSA (obstructive sleep apnea) 09/02/2010   Chronic urticaria 12/16/2009  STRESS INCONTINENCE 01/24/2007   History of peptic ulcer disease 07/26/2006    Past Surgical History:  Procedure Laterality Date   BREAST EXCISIONAL BIOPSY Right 1994   COLONOSCOPY     KNEE ARTHROSCOPY WITH MEDIAL MENISECTOMY Right 03/29/2023   Procedure: KNEE ARTHROSCOPY WITH MEDIAL MENISECTOMY AND INJECTION;  Surgeon: Beverley Evalene BIRCH, MD;  Location: [facility];  Service: Orthopedics;  Laterality: Right;   MANDIBLE SURGERY     due to trauma   TUBAL LIGATION      OB History   No obstetric history on file.      Home Medications    Prior to Admission  medications   Medication Sig Start Date End Date Taking? Authorizing Provider  azithromycin  (ZITHROMAX ) 250 MG tablet Take 1 tablet (250 mg total) by mouth daily. Take first 2 tablets together, then 1 every day until finished. 03/11/24  Yes Zacari Radick  N, FNP  ondansetron  (ZOFRAN -ODT) 4 MG disintegrating tablet Take 1 tablet (4 mg total) by mouth every 8 (eight) hours as needed for nausea or vomiting. 03/11/24  Yes Leigha Olberding  N, FNP  acetaminophen  (TYLENOL ) 500 MG tablet Take 2 tablets (1,000 mg total) by mouth every 6 (six) hours as needed. 02/18/23   Gabino Boga, MD  albuterol  (PROVENTIL ) (2.5 MG/3ML) 0.083% nebulizer solution Use 1 vial in nebulizer up to 3 times a day when albuterol  inhaler not working. If no better or getting worse, call 911 or physician line. 11/14/23   Marinda Rocky SAILOR, MD  albuterol  (VENTOLIN  HFA) 108 (90 Base) MCG/ACT inhaler INHALE 2 PUFFS BY MOUTH EVERY 6 HOURS AS NEEDED FOR WHEEZING FOR SHORTNESS OF BREATH 02/15/24   Marinda Rocky SAILOR, MD  BREZTRI  AEROSPHERE 160-9-4.8 MCG/ACT AERO inhaler Inhale 2 puffs into the lungs in the morning and at bedtime. 11/14/23   Marinda Rocky SAILOR, MD  Clotrimazole  1 % OINT Apply to right foot and between right toes twice daily for two weeks. 11/29/23   Karna Fellows, MD  EPINEPHrine  (EPIPEN  2-PAK) 0.3 mg/0.3 mL IJ SOAJ injection Inject 0.3 mg into the muscle as needed (for allergic reaction). 05/16/23   Marinda Rocky SAILOR, MD  pantoprazole  (PROTONIX ) 40 MG tablet Take 1 tablet (40 mg total) by mouth daily. 11/14/23   Marinda Rocky SAILOR, MD  rosuvastatin  (CRESTOR ) 5 MG tablet Take 1 tablet (5 mg total) by mouth daily. 12/06/23 12/05/24  Karna Fellows, MD  tirzepatide  (MOUNJARO ) 10 MG/0.5ML Pen Inject 10 mg into the skin once a week. Patient not taking: Reported on 03/11/2024 03/20/24   Karna Fellows, MD  tirzepatide  (MOUNJARO ) 7.5 MG/0.5ML Pen Inject 7.5 mg into the skin once a week. Patient not taking: Reported on 03/11/2024 02/21/24   Karna Fellows, MD   tirzepatide  (ZEPBOUND ) 5 MG/0.5ML Pen Inject 5 mg into the skin once a week. 01/11/24   Karna Fellows, MD  Vitamin D , Cholecalciferol , 25 MCG (1000 UT) CAPS Take 1 capsule by mouth daily. 12/06/23   Karna Fellows, MD  XOLAIR  150 MG/ML prefilled syringe INJECT 300MG  SUBCUTANEOUSLY  EVERY 4 WEEKS 04/18/23   Ambs, Arlean HERO, FNP    Family History Family History  Problem Relation Age of Onset   Diabetes Mother    Coronary artery disease Mother 32       requiring 5 vessel CABG   Diabetes Sister    Breast cancer Sister 20       died 2/2 breast ca age 71-60   Colon polyps Neg Hx    Colon cancer Neg Hx  Esophageal cancer Neg Hx    Rectal cancer Neg Hx    Stomach cancer Neg Hx     Social History Social History   Tobacco Use   Smoking status: Former    Current packs/day: 0.00    Average packs/day: 0.5 packs/day for 40.0 years (20.0 ttl pk-yrs)    Types: Cigarettes    Start date: 12/04/1970    Quit date: 12/04/2010    Years since quitting: 13.2    Passive exposure: Never   Smokeless tobacco: Never  Vaping Use   Vaping status: Never Used  Substance Use Topics   Alcohol use: Not Currently   Drug use: No     Allergies   Bee venom, Aspirin , and Other   Review of Systems Review of Systems  Per HPI  Physical Exam Triage Vital Signs ED Triage Vitals  Encounter Vitals Group     BP 03/11/24 0909 124/81     Girls Systolic BP Percentile --      Girls Diastolic BP Percentile --      Boys Systolic BP Percentile --      Boys Diastolic BP Percentile --      Pulse Rate 03/11/24 0909 86     Resp 03/11/24 0909 18     Temp 03/11/24 0909 98.2 F (36.8 C)     Temp Source 03/11/24 0909 Oral     SpO2 03/11/24 0909 93 %     Weight --      Height --      Head Circumference --      Peak Flow --      Pain Score 03/11/24 0916 10     Pain Loc --      Pain Education --      Exclude from Growth Chart --    No data found.  Updated Vital Signs BP 124/81 (BP Location: Right Arm)   Pulse 86    Temp 98.2 F (36.8 C) (Oral)   Resp 18   LMP 04/19/2004 (Approximate)   SpO2 93%   Visual Acuity Right Eye Distance:   Left Eye Distance:   Bilateral Distance:    Right Eye Near:   Left Eye Near:    Bilateral Near:     Physical Exam Vitals and nursing note reviewed.  Constitutional:      Appearance: Normal appearance.  HENT:     Head: Normocephalic and atraumatic.     Right Ear: External ear normal.     Left Ear: External ear normal.     Nose: Nose normal.     Mouth/Throat:     Mouth: Mucous membranes are moist.  Eyes:     Conjunctiva/sclera: Conjunctivae normal.  Cardiovascular:     Rate and Rhythm: Normal rate and regular rhythm.     Heart sounds: Normal heart sounds. No murmur heard. Pulmonary:     Effort: Pulmonary effort is normal. No respiratory distress.     Breath sounds: Normal breath sounds. No wheezing.  Skin:    General: Skin is warm and dry.  Neurological:     General: No focal deficit present.     Mental Status: She is alert.  Psychiatric:        Mood and Affect: Mood normal.      UC Treatments / Results  Labs (all labs ordered are listed, but only abnormal results are displayed) Labs Reviewed  CBC WITH DIFFERENTIAL/PLATELET  COMPREHENSIVE METABOLIC PANEL WITH GFR    EKG   Radiology DG Chest 2 View  Result Date: 03/11/2024 CLINICAL DATA:  cough EXAM: CHEST - 2 VIEW COMPARISON:  November 26, 2021 FINDINGS: The cardiomediastinal silhouette is unchanged in contour. No pleural effusion. No pneumothorax. Persistent diffuse bronchitic markings. LEFT retrocardiac plate like opacity, likely atelectasis. Visualized abdomen is unremarkable. Multilevel degenerative changes of the thoracic spine. IMPRESSION: Diffuse bronchitic markings. Differential considerations include small airways disease or atypical infection. Electronically Signed   By: Corean Salter M.D.   On: 03/11/2024 10:14    Procedures Procedures (including critical care  time)  Medications Ordered in UC Medications  ondansetron  (ZOFRAN -ODT) disintegrating tablet 4 mg (4 mg Oral Given 03/11/24 0919)  ketorolac  (TORADOL ) 30 MG/ML injection 15 mg (15 mg Intramuscular Given 03/11/24 1030)  dexamethasone  (DECADRON ) injection 10 mg (10 mg Intramuscular Given 03/11/24 1030)    Initial Impression / Assessment and Plan / UC Course  I have reviewed the triage vital signs and the nursing notes.  Pertinent labs & imaging results that were available during my care of the patient were reviewed by me and considered in my medical decision making (see chart for details).  Vitals and triage reviewed, patient is hemodynamically stable.  Lungs vesicular, heart with regular rate and rhythm.  Harsh barking cough on physical exam.  Chest x-ray by my interpretation shows bronchial inflammation.  Radiology overread concern for small airway inflammation versus atypical infection.  Due to symptom duration will cover with azithromycin  for atypical pneumonia, COPD exacerbation and asthma exacerbation.  EKG obtained over concern of chest pain.  Shows normal sinus rhythm at a rate of 96 bpm.  Without ST elevation or ST depression.  IM steroid given in clinic for COPD/asthma.  Toradol  given for headache.  Tolerating water after ODT Zofran , reassuring.  Will check CBC and CMP to evaluate for dehydration and electrolyte abnormalities.  Encouraged bland diet and oral rehydration.  Zofran  sent to pharmacy.  Strict emergency precautions given if symptoms evolve or worsen.  Patient and daughter verbalized understanding, no questions at this time.  Clinical Course as of 03/11/24 1127  Sun Mar 11, 2024  1027 DG Chest 2 View [GG]    Clinical Course User Index [GG] Dreama, Jhonnie Aliano  N, FNP    Final Clinical Impressions(s) / UC Diagnoses   Final diagnoses:  Acute cough  Nausea, vomiting and diarrhea  COPD with acute exacerbation [facility])     Discharge Instructions      We have given  you a Toradol  injection to help with your headache.  The steroid injection will help with the inflammation in your lungs.  Take the antibiotics once you have some food on your stomach.  I suggest a bland diet such as bananas, rice, toast and applesauce.  You can rehydrate orally with Gatorade, Pedialyte, soup and broth. Aim to drink at least 64 ounces of water daily. Start with small sips to help avoid stomach upset. Use the nausea medicine every 8 hours as needed, this dissolves under your tongue.  Labs will be back over the next day or so and you will be contacted if urgent follow-up is needed.   Symptoms should improve with medications.  Seek immediate care at the nearest emergency department if you develop any worsening symptoms, or new concerning symptoms.      ED Prescriptions     Medication Sig Dispense Auth. Provider   ondansetron  (ZOFRAN -ODT) 4 MG disintegrating tablet Take 1 tablet (4 mg total) by mouth every 8 (eight) hours as needed for nausea or vomiting. 20 tablet Dreama, Kaisyn Millea  LOISE, FNP  azithromycin  (ZITHROMAX ) 250 MG tablet Take 1 tablet (250 mg total) by mouth daily. Take first 2 tablets together, then 1 every day until finished. 6 tablet Dreama, Nitisha Civello  N, FNP      PDMP not reviewed this encounter.   Dreama, Malikai Gut  N, FNP 03/11/24 1127

## 2024-03-11 NOTE — Discharge Instructions (Addendum)
 We have given you a Toradol  injection to help with your headache.  The steroid injection will help with the inflammation in your lungs.  Take the antibiotics once you have some food on your stomach.  I suggest a bland diet such as bananas, rice, toast and applesauce.  You can rehydrate orally with Gatorade, Pedialyte, soup and broth. Aim to drink at least 64 ounces of water daily. Start with small sips to help avoid stomach upset. Use the nausea medicine every 8 hours as needed, this dissolves under your tongue.  Labs will be back over the next day or so and you will be contacted if urgent follow-up is needed.   Symptoms should improve with medications.  Seek immediate care at the nearest emergency department if you develop any worsening symptoms, or new concerning symptoms.

## 2024-03-12 ENCOUNTER — Other Ambulatory Visit: Payer: Self-pay

## 2024-03-12 ENCOUNTER — Emergency Department (HOSPITAL_COMMUNITY)

## 2024-03-12 ENCOUNTER — Emergency Department (HOSPITAL_COMMUNITY)
Admission: EM | Admit: 2024-03-12 | Discharge: 2024-03-12 | Disposition: A | Discharge status: Home / Self Care | Attending: Emergency Medicine | Admitting: Emergency Medicine

## 2024-03-12 ENCOUNTER — Ambulatory Visit (HOSPITAL_COMMUNITY): Payer: Self-pay

## 2024-03-12 ENCOUNTER — Ambulatory Visit: Payer: Self-pay

## 2024-03-12 ENCOUNTER — Encounter (HOSPITAL_COMMUNITY): Payer: Self-pay | Admitting: *Deleted

## 2024-03-12 DIAGNOSIS — R112 Nausea with vomiting, unspecified: Secondary | ICD-10-CM

## 2024-03-12 DIAGNOSIS — J439 Emphysema, unspecified: Secondary | ICD-10-CM | POA: Insufficient documentation

## 2024-03-12 DIAGNOSIS — I700 Atherosclerosis of aorta: Secondary | ICD-10-CM | POA: Diagnosis not present

## 2024-03-12 DIAGNOSIS — Z79899 Other long term (current) drug therapy: Secondary | ICD-10-CM | POA: Diagnosis not present

## 2024-03-12 DIAGNOSIS — J45909 Unspecified asthma, uncomplicated: Secondary | ICD-10-CM | POA: Diagnosis not present

## 2024-03-12 DIAGNOSIS — Z8616 Personal history of COVID-19: Secondary | ICD-10-CM | POA: Insufficient documentation

## 2024-03-12 DIAGNOSIS — R1013 Epigastric pain: Secondary | ICD-10-CM | POA: Diagnosis not present

## 2024-03-12 DIAGNOSIS — K8689 Other specified diseases of pancreas: Secondary | ICD-10-CM | POA: Diagnosis not present

## 2024-03-12 DIAGNOSIS — K869 Disease of pancreas, unspecified: Secondary | ICD-10-CM | POA: Insufficient documentation

## 2024-03-12 DIAGNOSIS — Z87891 Personal history of nicotine dependence: Secondary | ICD-10-CM | POA: Diagnosis not present

## 2024-03-12 DIAGNOSIS — J449 Chronic obstructive pulmonary disease, unspecified: Secondary | ICD-10-CM | POA: Insufficient documentation

## 2024-03-12 DIAGNOSIS — D72829 Elevated white blood cell count, unspecified: Secondary | ICD-10-CM | POA: Insufficient documentation

## 2024-03-12 DIAGNOSIS — N9489 Other specified conditions associated with female genital organs and menstrual cycle: Secondary | ICD-10-CM | POA: Diagnosis not present

## 2024-03-12 LAB — CBC
HCT: 46.3 % — ABNORMAL HIGH (ref 36.0–46.0)
Hemoglobin: 14.8 g/dL (ref 12.0–15.0)
MCH: 25 pg — ABNORMAL LOW (ref 26.0–34.0)
MCHC: 32 g/dL (ref 30.0–36.0)
MCV: 78.2 fL — ABNORMAL LOW (ref 80.0–100.0)
Platelets: 262 K/uL (ref 150–400)
RBC: 5.92 MIL/uL — ABNORMAL HIGH (ref 3.87–5.11)
RDW: 14.5 % (ref 11.5–15.5)
WBC: 17.7 K/uL — ABNORMAL HIGH (ref 4.0–10.5)
nRBC: 0 % (ref 0.0–0.2)

## 2024-03-12 LAB — COMPREHENSIVE METABOLIC PANEL WITH GFR
ALT: 8 IU/L (ref 0–32)
ALT: 12 U/L (ref 0–44)
AST: 16 IU/L (ref 0–40)
AST: 23 U/L (ref 15–41)
Albumin: 4.1 g/dL (ref 3.9–4.9)
Albumin: 3.4 g/dL — ABNORMAL LOW (ref 3.5–5.0)
Alkaline Phosphatase: 119 IU/L (ref 49–135)
Alkaline Phosphatase: 91 U/L (ref 38–126)
Anion gap: 11 (ref 5–15)
BUN/Creatinine Ratio: 8 — ABNORMAL LOW (ref 12–28)
BUN: 9 mg/dL (ref 8–27)
BUN: 12 mg/dL (ref 8–23)
Bilirubin Total: 0.5 mg/dL (ref 0.0–1.2)
CO2: 21 mmol/L (ref 20–29)
CO2: 22 mmol/L (ref 22–32)
Calcium: 9.9 mg/dL (ref 8.7–10.3)
Calcium: 10.1 mg/dL (ref 8.9–10.3)
Chloride: 101 mmol/L (ref 96–106)
Chloride: 107 mmol/L (ref 98–111)
Creatinine, Ser: 1.06 mg/dL — ABNORMAL HIGH (ref 0.57–1.00)
Creatinine, Ser: 1.12 mg/dL — ABNORMAL HIGH (ref 0.44–1.00)
GFR, Estimated: 54 mL/min — ABNORMAL LOW (ref >60)
Globulin, Total: 2.9 g/dL (ref 1.5–4.5)
Glucose, Bld: 101 mg/dL — ABNORMAL HIGH (ref 70–99)
Glucose: 85 mg/dL (ref 70–99)
Potassium: 4.4 mmol/L (ref 3.5–5.2)
Potassium: 3.8 mmol/L (ref 3.5–5.1)
Sodium: 137 mmol/L (ref 134–144)
Sodium: 140 mmol/L (ref 135–145)
Total Bilirubin: 0.5 mg/dL (ref 0.0–1.2)
Total Protein: 7 g/dL (ref 6.0–8.5)
Total Protein: 7.8 g/dL (ref 6.5–8.1)
eGFR: 57 mL/min/1.73 — ABNORMAL LOW (ref >59)

## 2024-03-12 LAB — CBC WITH DIFFERENTIAL/PLATELET
Basophils Absolute: 0.1 x10E3/uL (ref 0.0–0.2)
Basos: 1 %
EOS (ABSOLUTE): 0.6 x10E3/uL — ABNORMAL HIGH (ref 0.0–0.4)
Eos: 6 %
Hematocrit: 49.1 % — ABNORMAL HIGH (ref 34.0–46.6)
Hemoglobin: 14.8 g/dL (ref 11.1–15.9)
Immature Grans (Abs): 0 x10E3/uL (ref 0.0–0.1)
Immature Granulocytes: 0 %
Lymphocytes Absolute: 2.4 x10E3/uL (ref 0.7–3.1)
Lymphs: 23 %
MCH: 25 pg — ABNORMAL LOW (ref 26.6–33.0)
MCHC: 30.1 g/dL — ABNORMAL LOW (ref 31.5–35.7)
MCV: 83 fL (ref 79–97)
Monocytes Absolute: 0.7 x10E3/uL (ref 0.1–0.9)
Monocytes: 7 %
Neutrophils Absolute: 6.8 x10E3/uL (ref 1.4–7.0)
Neutrophils: 63 %
Platelets: 227 x10E3/uL (ref 150–450)
RBC: 5.93 x10E6/uL — ABNORMAL HIGH (ref 3.77–5.28)
RDW: 14 % (ref 11.7–15.4)
WBC: 10.6 x10E3/uL (ref 3.4–10.8)

## 2024-03-12 LAB — URINALYSIS, ROUTINE W REFLEX MICROSCOPIC
Bilirubin Urine: NEGATIVE
Glucose, UA: 50 mg/dL — AB
Hgb urine dipstick: NEGATIVE
Ketones, ur: NEGATIVE mg/dL
Leukocytes,Ua: NEGATIVE
Nitrite: NEGATIVE
Protein, ur: 100 mg/dL — AB
Specific Gravity, Urine: 1.015 (ref 1.005–1.030)
pH: 5 (ref 5.0–8.0)

## 2024-03-12 LAB — RESP PANEL BY RT-PCR (RSV, FLU A&B, COVID)  RVPGX2
Influenza A by PCR: NEGATIVE
Influenza B by PCR: NEGATIVE
Resp Syncytial Virus by PCR: NEGATIVE
SARS Coronavirus 2 by RT PCR: NEGATIVE

## 2024-03-12 LAB — LIPASE, BLOOD: Lipase: 22 U/L (ref 11–51)

## 2024-03-12 LAB — C DIFFICILE QUICK SCREEN W PCR REFLEX
C Diff antigen: NEGATIVE
C Diff interpretation: NOT DETECTED
C Diff toxin: NEGATIVE

## 2024-03-12 MED ORDER — METRONIDAZOLE 500 MG PO TABS: 500.0000 mg | ORAL_TABLET | Freq: Two times a day (BID) | ORAL | 0 refills | Status: DC

## 2024-03-12 MED ORDER — SODIUM CHLORIDE 0.9 % IV BOLUS
1000.0000 mL | Freq: Once | INTRAVENOUS | Status: AC
  Administered 2024-03-12: 1000 mL via INTRAVENOUS

## 2024-03-12 MED ORDER — FAMOTIDINE IN NACL 20-0.9 MG/50ML-% IV SOLN
20.0000 mg | Freq: Once | INTRAVENOUS | Status: AC
  Administered 2024-03-12: 20 mg via INTRAVENOUS
  Filled 2024-03-12: qty 50

## 2024-03-12 MED ORDER — ONDANSETRON 4 MG PO TBDP: 4.0000 mg | ORAL_TABLET | Freq: Three times a day (TID) | ORAL | 0 refills | Status: DC | PRN

## 2024-03-12 MED ORDER — ONDANSETRON HCL 4 MG/2ML IJ SOLN
4.0000 mg | Freq: Once | INTRAMUSCULAR | Status: AC
  Administered 2024-03-12: 4 mg via INTRAVENOUS
  Filled 2024-03-12: qty 2

## 2024-03-12 MED ORDER — CIPROFLOXACIN HCL 500 MG PO TABS: 500.0000 mg | ORAL_TABLET | Freq: Two times a day (BID) | ORAL | 0 refills | Status: DC

## 2024-03-12 MED ORDER — IOHEXOL 350 MG/ML SOLN
75.0000 mL | Freq: Once | INTRAVENOUS | Status: AC | PRN
  Administered 2024-03-12: 75 mL via INTRAVENOUS

## 2024-03-12 MED ORDER — CIPROFLOXACIN HCL 500 MG PO TABS
500.0000 mg | ORAL_TABLET | Freq: Once | ORAL | Status: AC
  Administered 2024-03-12: 500 mg via ORAL
  Filled 2024-03-12: qty 1

## 2024-03-12 MED ORDER — METRONIDAZOLE 500 MG PO TABS
500.0000 mg | ORAL_TABLET | Freq: Once | ORAL | Status: AC
  Administered 2024-03-12: 500 mg via ORAL
  Filled 2024-03-12: qty 1

## 2024-03-12 NOTE — Discharge Instructions (Addendum)
 It was a pleasure caring for you today in the emergency department.  Your pancreas appears abnormal on imaging today, please follow up with gastroenterology. You would likely benefit from MRI of your liver in the outpatient setting. This can likely be ordered through your PCP office  You were also noted to have 2 cysts in your pelvic region, please follow up with OBGYN, you would likely benefit from ultrasound. This can also likely be ordered by your PCP office  Please take the antibiotics as prescribed and follow-up with the GI doctor.  Please return to the emergency department for any worsening or worrisome symptoms.

## 2024-03-12 NOTE — ED Provider Notes (Signed)
 [facility] AT [facility] Provider Note  CSN: [id number] Arrival date & time: 03/12/24 1009  Chief Complaint(s) Diarrhea  HPI Maria Nelson is a 68 y.o. female with past medical history as below, significant for angioedema, asthma, copd, emphysema who presents to the ED with complaint of diarrhea, n/v, abd pain  Pt repotts around 5 days of symptoms, epig pain, cramping. Diarrhea copious w/o blood or melena, emesis NBNB. No fever. Family member/grandchild sick w/ similar. No recent abx, fever, or travel  Also having cough over the past few days, productive with white or yellow-colored sputum.  No dyspnea.  No chest pain  Past Medical History Past Medical History:  Diagnosis Date   Acute pain of right knee 01/26/2023   Adnexal pain 09/06/2018   ANGIOEDEMA 12/16/2009   Asthma    Blood pressure elevated without history of HTN 03/05/2014   Carpal tunnel syndrome 06/23/2011   Patient notes history of bilateral carpal tunnel syndrome.  Now has symptoms of right hand carpal tunnel.    Chronic pain of both knees 08/16/2016   COPD (chronic obstructive pulmonary disease) (HCC)    secondary to tobacco use // No PFTs on file   COVID-19    Dyspepsia 08/24/2017   Emphysema of lung (HCC)    not on O2 at home (09/08/2022)   Encounter for screening mammogram for malignant neoplasm of breast 07/06/2022   ETOH abuse    Pt stopped in 3/08   Gastrointestinal hemorrhage    secondary to PUD on March 2008   GERD (gastroesophageal reflux disease)    on meds   Low iron 12/03/2021   OSA on CPAP    noncompliant with CPAP (2/2 it being depressing)   Other fatigue 07/06/2022   Peptic ulcer disease    +H. pylori (antigen)- Dr. Obie- treated   Pulmonary nodule less than 6 mm determined by computed tomography of lung 12/03/2021   STRESS INCONTINENCE 01/24/2007   Tobacco abuse    quit in 2010   Urinary incontinence    Urticaria    Urticaria 07/01/2015    Patient Active Problem List   Diagnosis Date Noted   Former smoker 11/29/2023   Gastroesophageal reflux disease 05/16/2023   Vitamin D  deficiency 05/03/2023   Pulmonary hypertension, unspecified (HCC) 10/04/2022   Alkaline phosphatase raised 10/04/2022   Aortic atherosclerosis 07/06/2022   Tinea pedis of right foot 07/06/2022   Tubular adenoma of colon 07/06/2022   Stage 3a chronic kidney disease (HCC) 03/15/2022   Benign paroxysmal positional vertigo 09/03/2021   Healthcare maintenance 01/28/2021   Hyperlipidemia 11/22/2019   Paroxysmal atrial fibrillation (HCC) 08/24/2017   Back pain 06/23/2016   Seasonal and perennial allergic rhinitis 03/04/2015   Asthma with COPD (HCC) 06/19/2014   HNP (herniated nucleus pulposus), lumbar 04/16/2013   Tenosynovitis, de Quervain 11/15/2012   Morbid obesity with body mass index (BMI) of 50.0 to 59.9 in adult [facility]) 09/08/2012   Carpal tunnel syndrome 06/23/2011   Shoulder pain 09/02/2010   OSA (obstructive sleep apnea) 09/02/2010   Chronic urticaria 12/16/2009   STRESS INCONTINENCE 01/24/2007   History of peptic ulcer disease 07/26/2006   Home Medication(s) Prior to Admission medications   Medication Sig Start Date End Date Taking? Authorizing Provider  acetaminophen  (TYLENOL ) 500 MG tablet Take 2 tablets (1,000 mg total) by mouth every 6 (six) hours as needed. 02/18/23   Gabino Boga, MD  albuterol  (PROVENTIL ) (2.5 MG/3ML) 0.083% nebulizer solution Use 1 vial in nebulizer up to 3  times a day when albuterol  inhaler not working. If no better or getting worse, call 911 or physician line. 11/14/23   Marinda Rocky SAILOR, MD  albuterol  (VENTOLIN  HFA) 108 (90 Base) MCG/ACT inhaler INHALE 2 PUFFS BY MOUTH EVERY 6 HOURS AS NEEDED FOR WHEEZING FOR SHORTNESS OF BREATH 02/15/24   Marinda Rocky SAILOR, MD  azithromycin  (ZITHROMAX ) 250 MG tablet Take 1 tablet (250 mg total) by mouth daily. Take first 2 tablets together, then 1 every day until finished. 03/11/24    Dreama, [place]  N, FNP  BREZTRI  AEROSPHERE 160-9-4.8 MCG/ACT AERO inhaler Inhale 2 puffs into the lungs in the morning and at bedtime. 11/14/23   Marinda Rocky SAILOR, MD  Clotrimazole  1 % OINT Apply to right foot and between right toes twice daily for two weeks. 11/29/23   Karna Fellows, MD  EPINEPHrine  (EPIPEN  2-PAK) 0.3 mg/0.3 mL IJ SOAJ injection Inject 0.3 mg into the muscle as needed (for allergic reaction). 05/16/23   Marinda Rocky SAILOR, MD  ondansetron  (ZOFRAN -ODT) 4 MG disintegrating tablet Take 1 tablet (4 mg total) by mouth every 8 (eight) hours as needed for nausea or vomiting. 03/11/24   Dreama, [place]  N, FNP  pantoprazole  (PROTONIX ) 40 MG tablet Take 1 tablet (40 mg total) by mouth daily. 11/14/23   Marinda Rocky SAILOR, MD  rosuvastatin  (CRESTOR ) 5 MG tablet Take 1 tablet (5 mg total) by mouth daily. 12/06/23 12/05/24  Karna Fellows, MD  tirzepatide  (MOUNJARO ) 10 MG/0.5ML Pen Inject 10 mg into the skin once a week. Patient not taking: Reported on 03/11/2024 03/20/24   Karna Fellows, MD  tirzepatide  (MOUNJARO ) 7.5 MG/0.5ML Pen Inject 7.5 mg into the skin once a week. Patient not taking: Reported on 03/11/2024 02/21/24   Karna Fellows, MD  tirzepatide  (ZEPBOUND ) 5 MG/0.5ML Pen Inject 5 mg into the skin once a week. 01/11/24   Karna Fellows, MD  Vitamin D , Cholecalciferol , 25 MCG (1000 UT) CAPS Take 1 capsule by mouth daily. 12/06/23   Karna Fellows, MD  XOLAIR  150 MG/ML prefilled syringe INJECT 300MG  SUBCUTANEOUSLY  EVERY 4 WEEKS 04/18/23   Ambs, Arlean HERO, FNP                                                                                                                                    Past Surgical History Past Surgical History:  Procedure Laterality Date   BREAST EXCISIONAL BIOPSY Right 1994   COLONOSCOPY     KNEE ARTHROSCOPY WITH MEDIAL MENISECTOMY Right 03/29/2023   Procedure: KNEE ARTHROSCOPY WITH MEDIAL MENISECTOMY AND INJECTION;  Surgeon: Beverley Evalene BIRCH, MD;  Location: [facility];  Service:  Orthopedics;  Laterality: Right;   MANDIBLE SURGERY     due to trauma   TUBAL LIGATION     Family History Family History  Problem Relation Age of Onset   Diabetes Mother    Coronary artery disease Mother 68       requiring  5 vessel CABG   Diabetes Sister    Breast cancer Sister 39       died 2/2 breast ca age 24-60   Colon polyps Neg Hx    Colon cancer Neg Hx    Esophageal cancer Neg Hx    Rectal cancer Neg Hx    Stomach cancer Neg Hx     Social History Social History   Tobacco Use   Smoking status: Former    Current packs/day: 0.00    Average packs/day: 0.5 packs/day for 40.0 years (20.0 ttl pk-yrs)    Types: Cigarettes    Start date: 12/04/1970    Quit date: 12/04/2010    Years since quitting: 13.2    Passive exposure: Never   Smokeless tobacco: Never  Vaping Use   Vaping status: Never Used  Substance Use Topics   Alcohol use: Not Currently   Drug use: No   Allergies Bee venom, Aspirin , and Other  Review of Systems A thorough review of systems was obtained and all systems are negative except as noted in the HPI and PMH.   Physical Exam Vital Signs  I have reviewed the triage vital signs BP 122/78   Pulse 63   Temp 98.2 F (36.8 C)   Resp 18   Ht 5' (1.524 m)   Wt 109.8 kg   LMP 04/19/2004 (Approximate)   SpO2 97%   BMI 47.26 kg/m  Physical Exam Vitals and nursing note reviewed.  Constitutional:      General: She is not in acute distress.    Appearance: Normal appearance.  HENT:     Head: Normocephalic and atraumatic.     Right Ear: External ear normal.     Left Ear: External ear normal.     Nose: Nose normal.     Mouth/Throat:     Mouth: Mucous membranes are moist.  Eyes:     General: No scleral icterus.       Right eye: No discharge.        Left eye: No discharge.  Cardiovascular:     Rate and Rhythm: Normal rate and regular rhythm.     Pulses: Normal pulses.     Heart sounds: Normal heart sounds.  Pulmonary:     Effort: Pulmonary  effort is normal. No respiratory distress.     Breath sounds: Normal breath sounds. No stridor.  Abdominal:     General: Abdomen is flat. There is no distension.     Palpations: Abdomen is soft.     Tenderness: There is abdominal tenderness.   Musculoskeletal:     Cervical back: No rigidity.     Right lower leg: No edema.     Left lower leg: No edema.  Skin:    General: Skin is warm and dry.     Capillary Refill: Capillary refill takes less than 2 seconds.  Neurological:     Mental Status: She is alert.  Psychiatric:        Mood and Affect: Mood normal.        Behavior: Behavior normal. Behavior is cooperative.     ED Results and Treatments Labs (all labs ordered are listed, but only abnormal results are displayed) Labs Reviewed  COMPREHENSIVE METABOLIC PANEL WITH GFR - Abnormal; Notable for the following components:      Result Value   Glucose, Bld 101 (*)    Creatinine, Ser 1.12 (*)    Albumin 3.4 (*)    GFR, Estimated 54 (*)  All other components within normal limits  CBC - Abnormal; Notable for the following components:   WBC 17.7 (*)    RBC 5.92 (*)    HCT 46.3 (*)    MCV 78.2 (*)    MCH 25.0 (*)    All other components within normal limits  URINALYSIS, ROUTINE W REFLEX MICROSCOPIC - Abnormal; Notable for the following components:   APPearance HAZY (*)    Glucose, UA 50 (*)    Protein, ur 100 (*)    Bacteria, UA RARE (*)    All other components within normal limits  RESP PANEL BY RT-PCR (RSV, FLU A&B, COVID)  RVPGX2  C DIFFICILE QUICK SCREEN W PCR REFLEX    GASTROINTESTINAL PANEL BY PCR, STOOL (REPLACES STOOL CULTURE)  LIPASE, BLOOD  DIFFERENTIAL                                                                                                                          Radiology DG Chest Portable 1 View Result Date: 03/12/2024 CLINICAL DATA:  Nausea, vomiting, diarrhea and cough. EXAM: PORTABLE CHEST 1 VIEW COMPARISON:  03/11/2024 and CT chest 12/07/2023.  FINDINGS: Trachea is midline. Heart is at the upper limits of normal in size to mildly enlarged. Lungs are somewhat low in volume but clear. No pleural fluid. IMPRESSION: No acute findings. Electronically Signed   By: Newell Eke M.D.   On: 03/12/2024 14:44   CT ABDOMEN PELVIS W CONTRAST Result Date: 03/12/2024 CLINICAL DATA:  Several day history of nausea, vomiting, and diarrhea with dizziness and weakness EXAM: CT ABDOMEN AND PELVIS WITH CONTRAST TECHNIQUE: Multidetector CT imaging of the abdomen and pelvis was performed using the standard protocol following bolus administration of intravenous contrast. RADIATION DOSE REDUCTION: This exam was performed according to the departmental dose-optimization program which includes automated exposure control, adjustment of the mA and/or kV according to patient size and/or use of iterative reconstruction technique. CONTRAST:  75mL OMNIPAQUE  IOHEXOL  350 MG/ML SOLN COMPARISON:  None Available. FINDINGS: Lower chest: No focal consolidation or pulmonary nodule in the lung bases. No pleural effusion or pneumothorax demonstrated. Partially imaged heart size is normal. Hepatobiliary: 1.1 cm irregular focus of enhancement at the right hepatic dome (3:13), may be perfusional or represent a flash filling venous malformation. Subcentimeter hypodensity in segment 6/7 (3:22), too small to characterize. No intra or extrahepatic biliary ductal dilation. Gallbladder is contracted. Pancreas: Patchy hypodensity in the pancreatic uncinate process (3:37). No main pancreatic ductal dilation. No substantial peripancreatic stranding. Spleen: Normal in size without focal abnormality. Adrenals/Urinary Tract: No adrenal nodules. No suspicious renal mass, calculi or hydronephrosis. 3.0 cm posterior right upper pole simple cyst (3:28). Scattered subcentimeter hypodensities, too small to characterize. Urinary bladder is decompressed. Stomach/Bowel: Normal appearance of the stomach. No evidence  of bowel wall thickening, distention, or inflammatory changes. Fluid levels throughout the ascending and transverse colon. Normal appendix. Vascular/Lymphatic: Aortic atherosclerosis. No enlarged abdominal or pelvic lymph nodes. Reproductive: 5.1 cm simple left adnexal  cyst (3:65). Right adnexal lesion measuring 2.8 cm (3:65) is mildly hyperattenuating. Coarse calcification within the uterine body, likely a leiomyoma. Other: No free fluid, fluid collection, or free air. Musculoskeletal: No acute or abnormal lytic or blastic osseous lesions. Multilevel degenerative changes of the partially imaged thoracic and lumbar spine. IMPRESSION: 1. Patchy hypodensity in the pancreatic uncinate process, which may represent uneven lipomatosis, underlying small side branch intraductal papillary mucinous neoplasms (IPMN), or less likely pancreatitis in the absence of peripancreatic inflammatory changes or lipase elevation. Consider further evaluation with nonemergent contrast-enhanced MRI/MRCP or pancreas protocol CT abdomen. 2. Fluid levels throughout the ascending and transverse colon, which can be seen in the setting of diarrheal illness. 3. Bilateral adnexal lesions measuring up to 5.1 cm on the left and 2.8 cm on the right. Recommend further evaluation with pelvic ultrasound. 4.  Aortic Atherosclerosis (ICD10-I70.0). Electronically Signed   By: Limin  Xu M.D.   On: 03/12/2024 14:09    Pertinent labs & imaging results that were available during my care of the patient were reviewed by me and considered in my medical decision making (see MDM for details).  Medications Ordered in ED Medications  sodium chloride  0.9 % bolus 1,000 mL (1,000 mLs Intravenous New Bag/Given 03/12/24 1213)  ondansetron  (ZOFRAN ) injection 4 mg (4 mg Intravenous Given 03/12/24 1213)  famotidine  (PEPCID ) IVPB 20 mg premix (20 mg Intravenous New Bag/Given 03/12/24 1328)  iohexol  (OMNIPAQUE ) 350 MG/ML injection 75 mL (75 mLs Intravenous Contrast  Given 03/12/24 1312)                                                                                                                                     Procedures Procedures  (including critical care time)  Medical Decision Making / ED Course    Medical Decision Making:    Maria Nelson is a 68 y.o. female with past medical history as below, significant for angioedema, asthma, copd, emphysema who presents to the ED with complaint of diarrhea, n/v, abd pain, cough. The complaint involves an extensive differential diagnosis and also carries with it a high risk of complications and morbidity.  Serious etiology was considered. Ddx includes but is not limited to: Differential diagnosis includes but is not exclusive to acute cholecystitis, intrathoracic causes for epigastric abdominal pain, gastritis, duodenitis, pancreatitis, small bowel or large bowel obstruction, abdominal aortic aneurysm, hernia, gastritis, factious diarrhea, viral syndrome, COPD exacerbation, pneumonia, viral syndrome etc.   Complete initial physical exam performed, notably the patient was in distress.    Reviewed and confirmed nursing documentation for past medical history, family history, social history.  Vital signs reviewed.    Diarrhea Epigastric abdominal pain> - Copious diarrhea over the past 5 days, no formed stool.  Nonbloody nonmelanotic.  No recent antibiotics.  Positive sick contact exposure with similar symptoms - Check C. difficile, gastrointestinal PCR - CBC with leukocytosis 17.7; did receive steroids yesterday at [facility] -  CT c/w diarrheal illness  - not septic  Cough> - No significant dyspnea, no hypoxia or fever here - Chest x-ray neg - RVP neg - she was started on Abx yestd at [facility] as well   Clinical Course as of 03/12/24 1545  Mon Mar 12, 2024  1414  CTAP w/ - abnormal appearance of pancreatic uncinate process > recommend o/p MRCP - fluid in colon > diarrheal illness? - b/l  adnexal lesions > recommend pelvic u/s - aortic atherosclerosis [SG]  1452 CXR wnl [SG]    Clinical Course User Index [SG] Elnor Savant A, DO   Incidental findings noted on imaging, recommend o/p MRCP, o/p pelvic u/s  Handoff to Dr Ula pending stool studies, concern for infectious diarrhea given persistent and leukocytosis. Would recommend antibiotics, need to rule out cdiff. She is tolerating po and is nontoxic.                Additional history obtained: -Additional history obtained from na -External records from outside source obtained and reviewed including: Chart review including previous notes, labs, imaging, consultation notes including  Prior uc documentation Allergies    Lab Tests: -I ordered, reviewed, and interpreted labs.   The pertinent results include:   Labs Reviewed  COMPREHENSIVE METABOLIC PANEL WITH GFR - Abnormal; Notable for the following components:      Result Value   Glucose, Bld 101 (*)    Creatinine, Ser 1.12 (*)    Albumin 3.4 (*)    GFR, Estimated 54 (*)    All other components within normal limits  CBC - Abnormal; Notable for the following components:   WBC 17.7 (*)    RBC 5.92 (*)    HCT 46.3 (*)    MCV 78.2 (*)    MCH 25.0 (*)    All other components within normal limits  URINALYSIS, ROUTINE W REFLEX MICROSCOPIC - Abnormal; Notable for the following components:   APPearance HAZY (*)    Glucose, UA 50 (*)    Protein, ur 100 (*)    Bacteria, UA RARE (*)    All other components within normal limits  RESP PANEL BY RT-PCR (RSV, FLU A&B, COVID)  RVPGX2  C DIFFICILE QUICK SCREEN W PCR REFLEX    GASTROINTESTINAL PANEL BY PCR, STOOL (REPLACES STOOL CULTURE)  LIPASE, BLOOD  DIFFERENTIAL    Notable for wbc ++  EKG   EKG Interpretation Date/Time:  Monday March 12 2024 13:37:40 EST Ventricular Rate:  60 PR Interval:  169 QRS Duration:  85 QT Interval:  399 QTC Calculation: 399 R Axis:   7  Text Interpretation: Sinus rhythm  Abnormal R-wave progression, early transition Confirmed by Elnor Savant ([id number]) on 03/12/2024 2:32:47 PM         Imaging Studies ordered: I ordered imaging studies including CTAP CXR I independently visualized the following imaging with scope of interpretation limited to determining acute life threatening conditions related to emergency care; findings noted above I agree with the radiologist interpretation If any imaging was obtained with contrast I closely monitored patient for any possible adverse reaction a/w contrast administration in the emergency department   Medicines ordered and prescription drug management: Meds ordered this encounter  Medications   sodium chloride  0.9 % bolus 1,000 mL   ondansetron  (ZOFRAN ) injection 4 mg   famotidine  (PEPCID ) IVPB 20 mg premix   iohexol  (OMNIPAQUE ) 350 MG/ML injection 75 mL    -I have reviewed the patients home medicines and have made adjustments as needed  Consultations Obtained: na   Cardiac Monitoring: The patient was maintained on a cardiac monitor.  I personally viewed and interpreted the cardiac monitored which showed an underlying rhythm of: nsr Continuous pulse oximetry interpreted by myself, 98% on RA.    Social Determinants of Health:  Diagnosis or treatment significantly limited by social determinants of health: former smoker and obesity   Reevaluation: After the interventions noted above, I reevaluated the patient and found that they have improved  Co morbidities that complicate the patient evaluation  Past Medical History:  Diagnosis Date   Acute pain of right knee 01/26/2023   Adnexal pain 09/06/2018   ANGIOEDEMA 12/16/2009   Asthma    Blood pressure elevated without history of HTN 03/05/2014   Carpal tunnel syndrome 06/23/2011   Patient notes history of bilateral carpal tunnel syndrome.  Now has symptoms of right hand carpal tunnel.    Chronic pain of both knees 08/16/2016   COPD (chronic obstructive  pulmonary disease) (HCC)    secondary to tobacco use // No PFTs on file   COVID-19    Dyspepsia 08/24/2017   Emphysema of lung (HCC)    not on O2 at home (09/08/2022)   Encounter for screening mammogram for malignant neoplasm of breast 07/06/2022   ETOH abuse    Pt stopped in 3/08   Gastrointestinal hemorrhage    secondary to PUD on March 2008   GERD (gastroesophageal reflux disease)    on meds   Low iron 12/03/2021   OSA on CPAP    noncompliant with CPAP (2/2 it being depressing)   Other fatigue 07/06/2022   Peptic ulcer disease    +H. pylori (antigen)- Dr. Obie- treated   Pulmonary nodule less than 6 mm determined by computed tomography of lung 12/03/2021   STRESS INCONTINENCE 01/24/2007   Tobacco abuse    quit in 2010   Urinary incontinence    Urticaria    Urticaria 07/01/2015      Dispostion: Disposition decision including need for hospitalization was considered, and patient disposition pending at time of sign out.    Final Clinical Impression(s) / ED Diagnoses Final diagnoses:  Aortic atherosclerosis  Adnexal mass  Pancreatic lesion  Nausea vomiting and diarrhea        Elnor Jayson LABOR, DO 03/12/24 1545

## 2024-03-12 NOTE — Telephone Encounter (Signed)
 FYI Only or Action Required?: FYI only for provider: ED advised.  Patient was last seen in primary care on 02/21/2024 by Karna Fellows, MD.  Called Nurse Triage reporting Diarrhea.  Symptoms began several days ago.  Interventions attempted: Prescription medications: zofran , zpak.  Symptoms are: unchanged.  Triage Disposition: See HCP Within 4 Hours (Or PCP Triage)  Patient/caregiver understands and will follow disposition?: Yes, will follow disposition  Copied from CRM #[id number]. Topic: Clinical - Red Word Triage >> Mar 12, 2024  8:23 AM Antonio DEL wrote: Red Word that prompted transfer to Nurse Triage: Thinks she may be having a reaction from the Zepbound , started it in September. Has had diarrhea since Thursday. went to urgent care yesterday. Can't keep any food down. Feels like she's about to pass out sometimes. Reason for Disposition  [1] SEVERE diarrhea (e.g., 7 or more times / day more than normal) AND [2] age > 60 years  Answer Assessment - Initial Assessment Questions 1. DIARRHEA SEVERITY: How bad is the diarrhea? How many more stools have you had in the past 24 hours than normal?      10 episodes 2. ONSET: When did the diarrhea begin?      About 4 days ago 3. STOOL DESCRIPTION:  How loose or watery is the diarrhea? What is the stool color? Is there any blood or mucous in the stool?     watery 4. VOMITING: Are you also vomiting? If Yes, ask: How many times in the past 24 hours?  3-4 5. ABDOMEN PAIN: Are you having any abdomen pain? If Yes, ask: What does it feel like? (e.g., crampy, dull, intermittent, constant)      Hurting in my stomach 7. ORAL INTAKE: If vomiting, Have you been able to drink liquids? How much liquids have you had in the past 24 hours?     States she feels like she has been drinking her fluids 8. HYDRATION: Any signs of dehydration? (e.g., dry mouth [not just dry lips], too weak to stand, dizziness, new weight loss) When did you last  urinate?    States she feels like she is going to pass out when she stands.   At [facility] yesterday they discharged her with zofran . Pt states that she feels weak and feels like she is going to pass out if she stands, advised to get ride to the ED for evaluation. Pt agreeable.  Protocols used: [facility]

## 2024-03-12 NOTE — ED Notes (Signed)
 Patient transported to CT

## 2024-03-12 NOTE — Telephone Encounter (Signed)
 Called pt - who stated she just arrived at the ER.

## 2024-03-12 NOTE — ED Triage Notes (Signed)
 Pt c/o nausea, vomiting, and diarrhea for past 4-5 days. Pt c/o feeling dizzy and weak. Pt denies pain.

## 2024-03-12 NOTE — ED Provider Notes (Signed)
 I received the patient in signout.  C. difficile testing was pending at the time of signout.  This was negative.  Given the patient's leukocytosis and with the severe diarrhea plan was discussed with Dr. Elnor is for treatment for colitis.  The patient is given ciprofloxacin  and Flagyl .  She is discharged with return precautions.  I did discuss her incidental findings with her and she is encouraged to follow-up with regarding these.  She is discharged with return precautions. Physical Exam  BP 103/77   Pulse 62   Temp 98.3 F (36.8 C)   Resp 20   Ht 5' (1.524 m)   Wt 109.8 kg   LMP 04/19/2004 (Approximate)   SpO2 100%   BMI 47.26 kg/m   Physical Exam  Procedures  Procedures  ED Course / MDM   Clinical Course as of 03/12/24 2004  Mon Mar 12, 2024  1414  CTAP w/ - abnormal appearance of pancreatic uncinate process > recommend o/p MRCP - fluid in colon > diarrheal illness? - b/l adnexal lesions > recommend pelvic u/s - aortic atherosclerosis [SG]  1452 CXR wnl [SG]    Clinical Course User Index [SG] Elnor Jayson LABOR, DO   Medical Decision Making Amount and/or Complexity of Data Reviewed Labs: ordered. Radiology: ordered.  Risk Prescription drug management.          Maria Prentice SAUNDERS, MD 03/12/24 2004

## 2024-03-13 LAB — GASTROINTESTINAL PANEL BY PCR, STOOL (REPLACES STOOL CULTURE)

## 2024-03-14 ENCOUNTER — Ambulatory Visit (INDEPENDENT_AMBULATORY_CARE_PROVIDER_SITE_OTHER)

## 2024-03-14 DIAGNOSIS — L501 Idiopathic urticaria: Secondary | ICD-10-CM

## 2024-03-19 ENCOUNTER — Telehealth: Payer: Self-pay | Admitting: *Deleted

## 2024-03-19 NOTE — Telephone Encounter (Signed)
 Will forward to PCP.                             Copied from CRM [id number]. Topic: Clinical - Medical Advice >> Mar 19, 2024 11:08 AM Maria Nelson wrote: Reason for CRM: Patient called in stating that she was in the hospital last week and is unsure if it was due to her Zepbound , patient is suppose to be changing her dose today and would like to speak to Dr. Karna today to discuss this. Patient is requesting a phone call.

## 2024-03-20 NOTE — Telephone Encounter (Signed)
 Attempted to return patient's call, no answer. Left voicemail. Will attempt again.

## 2024-03-21 ENCOUNTER — Telehealth: Payer: Self-pay | Admitting: Internal Medicine

## 2024-03-21 ENCOUNTER — Telehealth: Payer: Self-pay | Admitting: *Deleted

## 2024-03-21 DIAGNOSIS — N9489 Other specified conditions associated with female genital organs and menstrual cycle: Secondary | ICD-10-CM

## 2024-03-21 DIAGNOSIS — R935 Abnormal findings on diagnostic imaging of other abdominal regions, including retroperitoneum: Secondary | ICD-10-CM

## 2024-03-21 NOTE — Telephone Encounter (Signed)
 Will forward to Dr. Karna who has attempted to call patient.                Copied from CRM [id number]. Topic: Clinical - Medical Advice >> Mar 19, 2024 11:08 AM Maria Nelson wrote: Reason for CRM: Patient called in stating that she was in the hospital last week and is unsure if it was due to her Zepbound , patient is suppose to be changing her dose today and would like to speak to Dr. Karna today to discuss this. Patient is requesting a phone call. >> Mar 21, 2024 12:19 PM Maria Nelson wrote: Patient returned missed call. States phone only rings once then hangs up. Does not give her time to answer. Attempted to reach CAL - clinic closed for lunch. Thank You

## 2024-03-21 NOTE — Telephone Encounter (Signed)
 Attempted again to return patient's call, no answer.

## 2024-03-21 NOTE — Telephone Encounter (Signed)
 Returned patient's call. Suspect viral illness causing GI symptoms of N/V/diarrhea. Sick contacts with similar symptoms and previous tolerance of Zepbound  without dose change prior to symptom onset. Maria Nelson is feeling much better, no persistent vomiting/diarrhea. Eating and drinking normally. She would like to wait until next week to resume Zepbound  which should be fine. She has one more dose of 5 mg, then will increase to 7.5 mg as discussed at prior visit. She will contact me with any concerning GI symptoms. F/u with GI team scheduled for 04/25/24.   Incidental findings noted in the pancreas and bilateral adnexa on CTAP done in ED. Discussed need for additional imaging for which I will place orders.

## 2024-03-25 ENCOUNTER — Other Ambulatory Visit: Payer: Self-pay | Admitting: Family Medicine

## 2024-03-30 ENCOUNTER — Other Ambulatory Visit: Payer: Self-pay | Admitting: Internal Medicine

## 2024-03-30 ENCOUNTER — Ambulatory Visit (HOSPITAL_COMMUNITY): Admission: RE | Admit: 2024-03-30 | Discharge: 2024-03-30 | Attending: Internal Medicine

## 2024-03-30 DIAGNOSIS — R935 Abnormal findings on diagnostic imaging of other abdominal regions, including retroperitoneum: Secondary | ICD-10-CM

## 2024-03-30 DIAGNOSIS — N9489 Other specified conditions associated with female genital organs and menstrual cycle: Secondary | ICD-10-CM

## 2024-03-30 MED ORDER — GADOBUTROL 1 MMOL/ML IV SOLN
10.0000 mL | Freq: Once | INTRAVENOUS | Status: AC | PRN
  Administered 2024-03-30: 10 mL via INTRAVENOUS

## 2024-04-04 ENCOUNTER — Ambulatory Visit: Payer: Self-pay | Admitting: Internal Medicine

## 2024-04-04 NOTE — Progress Notes (Signed)
 Dr. Karna away on maternity leave. Imaging results reviewed. GI appt scheduled for 04/25/24.

## 2024-04-06 ENCOUNTER — Ambulatory Visit

## 2024-04-06 DIAGNOSIS — L501 Idiopathic urticaria: Secondary | ICD-10-CM | POA: Diagnosis not present

## 2024-04-09 ENCOUNTER — Ambulatory Visit: Admitting: Internal Medicine

## 2024-04-09 ENCOUNTER — Encounter: Payer: Self-pay | Admitting: Internal Medicine

## 2024-04-09 VITALS — BP 128/85 | HR 59 | Temp 97.9°F | Ht 60.0 in | Wt 242.0 lb

## 2024-04-09 DIAGNOSIS — L508 Other urticaria: Secondary | ICD-10-CM | POA: Diagnosis not present

## 2024-04-09 DIAGNOSIS — Z122 Encounter for screening for malignant neoplasm of respiratory organs: Secondary | ICD-10-CM | POA: Diagnosis not present

## 2024-04-09 DIAGNOSIS — J4489 Other specified chronic obstructive pulmonary disease: Secondary | ICD-10-CM

## 2024-04-09 DIAGNOSIS — Z87891 Personal history of nicotine dependence: Secondary | ICD-10-CM

## 2024-04-09 NOTE — Progress Notes (Signed)
 "              Maria Nelson    [id number]    07/28/1955  Primary Care Physician:Lau, Ronnald, MD Date of Appointment: 04/09/2024 Established Patient Visit  Chief complaint:   Chief Complaint  Patient presents with   Asthma   COPD    Patient is doing good      HPI: Maria Nelson is a 68 y.o. woman with 40 pack year smoking history and copd  with untreated OHS/OSA. Quit smoking in 2015 Allergy  and asthma - Dr Marinda  Interval Updates: Here for asthma copd overlap syndrome follow up.  Last seen 1.5 years ago.   Faithfully uses breztri  inhaler 2 puffs BID. Albuterol  prn use less than once/week.   Still on xolair  injections.   Allergy  symptoms well controlled Nov 2025.   She is on zepbound  for weight loss.   No interval exacerbations for breathing.   Gets up at night to go to the bathroom.   Current Regimen: Breztri  2 puffs twice a day, albuterol  prn, xolair .  Asthma Triggers: seasonal allergies, URI, strong smells.  Exacerbations in the last year:  December 2023 History of hospitalization or intubation: never Allergy  Testing: yes in 2022 - multiple environmental aeroallergens GERD: yes on PPI with protonix  daily.  Allergic Rhinitis: yes not currently on therapy, was prescribed allegra  at last visit.  ACT:  Asthma Control Test ACT Total Score  05/16/2023 10:00 AM 21  02/21/2023 10:14 AM 23  09/01/2022 11:00 AM 19   FeNO:  IgE 1443 Absolute eosinophil count 1000 in 2016.  I have reviewed the patient's family social and past medical history and updated as appropriate.   Past Medical History:  Diagnosis Date   Acute pain of right knee 01/26/2023   Adnexal pain 09/06/2018   ANGIOEDEMA 12/16/2009   Asthma    Blood pressure elevated without history of HTN 03/05/2014   Carpal tunnel syndrome 06/23/2011   Patient notes history of bilateral carpal tunnel syndrome.  Now has symptoms of right hand carpal tunnel.    Chronic pain of both  knees 08/16/2016   COPD (chronic obstructive pulmonary disease) (HCC)    secondary to tobacco use // No PFTs on file   COVID-19    Dyspepsia 08/24/2017   Emphysema of lung (HCC)    not on O2 at home (09/08/2022)   Encounter for screening mammogram for malignant neoplasm of breast 07/06/2022   ETOH abuse    Pt stopped in 3/08   Gastrointestinal hemorrhage    secondary to PUD on March 2008   GERD (gastroesophageal reflux disease)    on meds   Low iron 12/03/2021   OSA on CPAP    noncompliant with CPAP (2/2 it being depressing)   Other fatigue 07/06/2022   Peptic ulcer disease    +H. pylori (antigen)- Dr. Obie- treated   Pulmonary nodule less than 6 mm determined by computed tomography of lung 12/03/2021   STRESS INCONTINENCE 01/24/2007   Tobacco abuse    quit in 2010   Urinary incontinence    Urticaria    Urticaria 07/01/2015    Past Surgical History:  Procedure Laterality Date   BREAST EXCISIONAL BIOPSY Right 1994   COLONOSCOPY     KNEE ARTHROSCOPY WITH MEDIAL MENISECTOMY Right 03/29/2023   Procedure: KNEE ARTHROSCOPY WITH MEDIAL MENISECTOMY AND INJECTION;  Surgeon: Beverley Evalene BIRCH, MD;  Location: [facility];  Service: Orthopedics;  Laterality: Right;   MANDIBLE SURGERY  due to trauma   TUBAL LIGATION      Family History  Problem Relation Age of Onset   Diabetes Mother    Coronary artery disease Mother 22       requiring 5 vessel CABG   Diabetes Sister    Breast cancer Sister 8       died 2/2 breast ca age 13-60   Colon polyps Neg Hx    Colon cancer Neg Hx    Esophageal cancer Neg Hx    Rectal cancer Neg Hx    Stomach cancer Neg Hx     Social History   Occupational History   Occupation: Disability    Comment: previously worked in [employer] had to stop 2/2 occupational exposures leading to exacerbations of asthma   Tobacco Use   Smoking status: Former    Current packs/day: 0.00    Average packs/day: 0.5 packs/day for 40.0 years (20.0  ttl pk-yrs)    Types: Cigarettes    Start date: 12/04/1970    Quit date: 12/04/2010    Years since quitting: 13.3    Passive exposure: Never   Smokeless tobacco: Never  Vaping Use   Vaping status: Never Used  Substance and Sexual Activity   Alcohol use: Not Currently   Drug use: No   Sexual activity: Not Currently     Physical Exam: Blood pressure 128/85, pulse (!) 59, temperature 97.9 F (36.6 C), temperature source Oral, height 5' (1.524 m), weight 242 lb (109.8 kg), last menstrual period 04/19/2004, SpO2 93%.  Gen:      No distress, obese, well appearing Lungs:  diminished, no wheezes or crackles CV:         RRR no edema    Data Reviewed: Imaging: I have personally reviewed the CT Chest lung cancer screening Sept 2025 no concerning nodules or masses.   PFTs:     Latest Ref Rng & Units 03/11/2014   12:16 PM  PFT Results  FVC-Pre L 1.50   FVC-Predicted Pre % 66   FVC-Post L 1.75   FVC-Predicted Post % 77   Pre FEV1/FVC % % 55   Post FEV1/FCV % % 51   FEV1-Pre L 0.82   FEV1-Predicted Pre % 46   FEV1-Post L 0.90   DLCO uncorrected ml/min/mmHg 7.57   DLCO UNC% % 40   DLCO corrected ml/min/mmHg 7.57   DLCO COR %Predicted % 40   DLVA Predicted % 79   TLC L 3.88   TLC % Predicted % 87   RV % Predicted % 125    I have personally reviewed the patient's PFTs and moderate airflow limitation confirmed on most recent spirometry from allergy  clinic  Labs: Elevated IgE, multiple grass and tree sensitivities.  Lab Results  Component Value Date   NA 140 03/12/2024   K 3.8 03/12/2024   CO2 22 03/12/2024   GLUCOSE 101 (H) 03/12/2024   BUN 12 03/12/2024   CREATININE 1.12 (H) 03/12/2024   CALCIUM  10.1 03/12/2024   EGFR 57 (L) 03/11/2024   GFRNONAA 54 (L) 03/12/2024   Lab Results  Component Value Date   WBC 17.7 (H) 03/12/2024   HGB 14.8 03/12/2024   HCT 46.3 (H) 03/12/2024   MCV 78.2 (L) 03/12/2024   PLT 262 03/12/2024    Immunization status: Immunization  History  Administered Date(s) Administered   Fluad Quad(high Dose 65+) 01/28/2021, 03/15/2022   Fluad Trivalent(High Dose 65+) 02/21/2023   INFLUENZA, HIGH DOSE SEASONAL PF 02/21/2024   Influenza Whole  01/24/2007, 06/06/2009, 12/24/2009, 12/25/2010   Influenza,inj,Quad PF,6+ Mos 02/14/2013, 03/05/2014, 02/11/2016, 05/10/2018, 12/05/2018   PFIZER(Purple Top)SARS-COV-2 Vaccination 08/21/2019   PNEUMOCOCCAL CONJUGATE-20 07/13/2021   PPD Test 09/21/2022   Pneumococcal Polysaccharide-23 06/06/2009   Pneumococcal-Unspecified 07/25/2022   Tdap 06/23/2011, 07/25/2022    External Records Personally Reviewed: allergy .   Assessment:  Dyspnea on exertion - multifactorial from COPD, obesity, untreated OHS/OSA Asthma COPD overlap syndrome - predominantly copd.  Chronic urticaria, Elevated IgE with allergic rhinitis, on xolair  Need for lung cancer screening - quit smoking 2015 OSA not on CPAP  Glad the breathing is doing ok Continue the breztri  2 puffs twice a day, gargle after use. Continue the rescue inhaler as needed.  Continue xolair  with Dr. Marinda CT Scan in September 2025 looked good - no evidence of lung cancer. Next due in september 2026.   Return to Care: Return in about 1 year (around 04/09/2025) for Dr. Pleas.   Verdon Gore, MD Pulmonary and Critical Care Medicine [facility] [phone]      "

## 2024-04-09 NOTE — Patient Instructions (Addendum)
 It was a pleasure to see you today!  Please schedule follow up with  in 12 months. Please call sooner [phone] if issues or concerns arise. You can also send us  a message through MyChart, but but aware that this is not to be used for urgent issues and it may take up to 5-7 days to receive a reply. Please be aware that you will likely be able to view your results before I have a chance to respond to them. Please give us  5 business days to respond to any non-urgent results.    Glad the breathing is doing ok Continue the breztri  2 puffs twice a day, gargle after use. Continue the rescue inhaler as needed.  Continue xolair  with Dr. Marinda CT Scan in September 2025 looked good - no evidence of lung cancer. Next due in september 2026.

## 2024-04-16 ENCOUNTER — Ambulatory Visit: Payer: Self-pay

## 2024-04-16 DIAGNOSIS — N838 Other noninflammatory disorders of ovary, fallopian tube and broad ligament: Secondary | ICD-10-CM

## 2024-04-25 ENCOUNTER — Encounter: Payer: Self-pay | Admitting: Gastroenterology

## 2024-04-25 ENCOUNTER — Ambulatory Visit: Admitting: Gastroenterology

## 2024-04-25 VITALS — BP 122/78 | HR 78 | Ht 60.0 in | Wt 240.0 lb

## 2024-04-25 DIAGNOSIS — K529 Noninfective gastroenteritis and colitis, unspecified: Secondary | ICD-10-CM

## 2024-04-25 NOTE — Progress Notes (Signed)
 "    04/25/2024 Maria Nelson [id number] 02-Sep-1955   Discussed the use of AI scribe software for clinical note transcription with the patient, who gave verbal consent to proceed.  History of Present Illness Maria Nelson is a 69 year old female who presents for follow-up after an episode of viral gastroenteritis evaluated in the emergency room in November.  She is a patient of Dr. Trenna.  In November, she experienced acute onset nausea, vomiting, and diarrhea, prompting an emergency room visit. During her ER stay, she received hydration, antiemetics, and antibiotics. Stool studies and imaging did not identify a definitive etiology. Symptoms have completely resolved, and she currently feels well with normal bowel movements and no ongoing gastrointestinal complaints.  A CT scan of the abdomen and pelvis performed during her ER evaluation raised a question regarding the pancreas. Subsequent MRI of the abdomen on December 13 demonstrated normal pancreas, liver, and gallbladder without lesions or ductal dilation.  LFTs and lipase are normal.  History includes ovarian cysts and uterine fibroids identified on pelvic ultrasound in December. She is scheduled for a pelvic MRI tomorrow for further evaluation.  She previously discontinued Mounjaro  around the time of her viral symptoms and has not resumed therapy. Colonoscopy was performed last year, with next surveillance due in June 2027.  Colonoscopy 09/2022:  - Two 5 to 11 mm polyps in the transverse colon and in the ascending colon, removed with a hot snare. Resected and retrieved. - Diverticulosis in the sigmoid colon and in the descending colon. - Non- bleeding external and internal hemorrhoids.  Surgical [P], colon, ascending, polyp (2) - TUBULAR ADENOMA(S) (MULTIPLE FRAGMENTS) - NEGATIVE FOR HIGH-GRADE DYSPLASIA OR MALIGNANCY   Past Medical History:  Diagnosis Date   Acute pain of right knee 01/26/2023    Adnexal pain 09/06/2018   ANGIOEDEMA 12/16/2009   Asthma    Blood pressure elevated without history of HTN 03/05/2014   Carpal tunnel syndrome 06/23/2011   Patient notes history of bilateral carpal tunnel syndrome.  Now has symptoms of right hand carpal tunnel.    Chronic pain of both knees 08/16/2016   COPD (chronic obstructive pulmonary disease) (HCC)    secondary to tobacco use // No PFTs on file   COVID-19    Dyspepsia 08/24/2017   Emphysema of lung (HCC)    not on O2 at home (09/08/2022)   Encounter for screening mammogram for malignant neoplasm of breast 07/06/2022   ETOH abuse    Pt stopped in 3/08   Gastrointestinal hemorrhage    secondary to PUD on March 2008   GERD (gastroesophageal reflux disease)    on meds   Low iron 12/03/2021   OSA on CPAP    noncompliant with CPAP (2/2 it being depressing)   Other fatigue 07/06/2022   Peptic ulcer disease    +H. pylori (antigen)- Dr. Obie- treated   Pulmonary nodule less than 6 mm determined by computed tomography of lung 12/03/2021   STRESS INCONTINENCE 01/24/2007   Tobacco abuse    quit in 2010   Urinary incontinence    Urticaria    Urticaria 07/01/2015   Past Surgical History:  Procedure Laterality Date   BREAST EXCISIONAL BIOPSY Right 1994   COLONOSCOPY     KNEE ARTHROSCOPY WITH MEDIAL MENISECTOMY Right 03/29/2023   Procedure: KNEE ARTHROSCOPY WITH MEDIAL MENISECTOMY AND INJECTION;  Surgeon: Beverley Evalene BIRCH, MD;  Location: [facility];  Service: Orthopedics;  Laterality: Right;   MANDIBLE SURGERY  due to trauma   TUBAL LIGATION      reports that she quit smoking about 13 years ago. Her smoking use included cigarettes. She started smoking about 53 years ago. She has a 20 pack-year smoking history. She has never been exposed to tobacco smoke. She has never used smokeless tobacco. She reports that she does not currently use alcohol. She reports that she does not use drugs. family history includes  Breast cancer (age of onset: 76) in her sister; Coronary artery disease (age of onset: 60) in her mother; Diabetes in her mother and sister. Allergies[1]    Outpatient Encounter Medications as of 04/25/2024  Medication Sig   acetaminophen  (TYLENOL ) 500 MG tablet Take 2 tablets (1,000 mg total) by mouth every 6 (six) hours as needed.   albuterol  (PROVENTIL ) (2.5 MG/3ML) 0.083% nebulizer solution Use 1 vial in nebulizer up to 3 times a day when albuterol  inhaler not working. If no better or getting worse, call 911 or physician line.   albuterol  (VENTOLIN  HFA) 108 (90 Base) MCG/ACT inhaler INHALE 2 PUFFS BY MOUTH EVERY 6 HOURS AS NEEDED FOR WHEEZING FOR SHORTNESS OF BREATH   BREZTRI  AEROSPHERE 160-9-4.8 MCG/ACT AERO inhaler Inhale 2 puffs into the lungs in the morning and at bedtime.   Clotrimazole  1 % OINT Apply to right foot and between right toes twice daily for two weeks.   EPINEPHrine  (EPIPEN  2-PAK) 0.3 mg/0.3 mL IJ SOAJ injection Inject 0.3 mg into the muscle as needed (for allergic reaction).   ondansetron  (ZOFRAN -ODT) 4 MG disintegrating tablet Take 1 tablet (4 mg total) by mouth every 8 (eight) hours as needed for nausea or vomiting.   ondansetron  (ZOFRAN -ODT) 4 MG disintegrating tablet Take 1 tablet (4 mg total) by mouth every 8 (eight) hours as needed for nausea or vomiting.   pantoprazole  (PROTONIX ) 40 MG tablet Take 1 tablet (40 mg total) by mouth daily.   rosuvastatin  (CRESTOR ) 5 MG tablet Take 1 tablet (5 mg total) by mouth daily.   Vitamin D , Cholecalciferol , 25 MCG (1000 UT) CAPS Take 1 capsule by mouth daily.   XOLAIR  150 MG/ML prefilled syringe INJECT 2 SYRINGES SUBCUTANEOUSLY EVERY 4 WEEKS   tirzepatide  (MOUNJARO ) 10 MG/0.5ML Pen Inject 10 mg into the skin once a week. (Patient not taking: Reported on 04/25/2024)   tirzepatide  (MOUNJARO ) 7.5 MG/0.5ML Pen Inject 7.5 mg into the skin once a week. (Patient not taking: Reported on 04/25/2024)   tirzepatide  (ZEPBOUND ) 5 MG/0.5ML Pen Inject  5 mg into the skin once a week. (Patient not taking: Reported on 04/25/2024)   [DISCONTINUED] ciprofloxacin  (CIPRO ) 500 MG tablet Take 1 tablet (500 mg total) by mouth every 12 (twelve) hours. (Patient not taking: Reported on 04/09/2024)   [DISCONTINUED] metroNIDAZOLE  (FLAGYL ) 500 MG tablet Take 1 tablet (500 mg total) by mouth 2 (two) times daily. (Patient not taking: Reported on 04/09/2024)   Facility-Administered Encounter Medications as of 04/25/2024  Medication   omalizumab  (XOLAIR ) prefilled syringe 300 mg     REVIEW OF SYSTEMS  : All other systems reviewed and negative except where noted in the History of Present Illness.   PHYSICAL EXAM: BP 122/78   Pulse 78   Ht 5' (1.524 m)   Wt 240 lb (108.9 kg)   LMP 04/19/2004   SpO2 96%   BMI 46.87 kg/m  General: Well developed AA female in no acute distress Head: Normocephalic and atraumatic Eyes:  Sclerae anicteric, conjunctiva pink. Ears: Normal auditory acuity Musculoskeletal: Symmetrical with no gross deformities  Skin:  No lesions on visible extremities Neurological: Alert oriented x 4, grossly nonfocal Psychological:  Alert and cooperative. Normal mood and affect  Assessment & Plan Viral gastroenteritis, resolved She experienced an acute, self-limited episode of viral gastroenteritis with complete resolution of symptoms. Imaging, including CT and MRI of the abdomen, and prior stool studies were unremarkable, with no evidence of ongoing gastrointestinal pathology. - Reviewed results of prior stool studies, CT, and MRI abdomen, all unremarkable. - Reassured her regarding normal imaging and symptom resolution. - No further diagnostic or therapeutic interventions indicated.  Follow-up prn for now.  Colorectal cancer screening surveillance She is current with colorectal cancer screening and is not due for repeat colonoscopy until June 2027. She has no new gastrointestinal symptoms to warrant earlier surveillance. - Confirmed  colonoscopy recall is set for June 2027. - Provided anticipatory guidance that she will receive a recall letter for her next colonoscopy unless new symptoms develop.   CC:  Karna Fellows, MD        [1]  Allergies Allergen Reactions   Bee Venom Anaphylaxis   Aspirin  Nausea Only   Other Rash    EKG pads cause burning sensation as soon as applied   "

## 2024-04-26 ENCOUNTER — Ambulatory Visit (HOSPITAL_COMMUNITY)
Admission: RE | Admit: 2024-04-26 | Discharge: 2024-04-26 | Disposition: A | Discharge status: Home / Self Care | Source: Ambulatory Visit

## 2024-04-26 DIAGNOSIS — N838 Other noninflammatory disorders of ovary, fallopian tube and broad ligament: Secondary | ICD-10-CM | POA: Insufficient documentation

## 2024-04-26 MED ORDER — GADOBUTROL 1 MMOL/ML IV SOLN
10.0000 mL | Freq: Once | INTRAVENOUS | Status: AC | PRN
  Administered 2024-04-26: 10 mL via INTRAVENOUS

## 2024-05-04 ENCOUNTER — Ambulatory Visit

## 2024-05-04 DIAGNOSIS — L501 Idiopathic urticaria: Secondary | ICD-10-CM | POA: Diagnosis not present

## 2024-05-16 ENCOUNTER — Ambulatory Visit: Payer: Self-pay

## 2024-05-16 DIAGNOSIS — N838 Other noninflammatory disorders of ovary, fallopian tube and broad ligament: Secondary | ICD-10-CM

## 2024-05-17 ENCOUNTER — Telehealth: Payer: Self-pay | Admitting: *Deleted

## 2024-05-17 NOTE — Telephone Encounter (Signed)
 Spoke with the patient regarding the referral to GYN oncology. Patient scheduled as new patient with Dr Eldonna on 2/9 at 9:45 am. Patient given an arrival time of 9:15 am.  Explained to the patient the the doctor will perform a pelvic exam at this visit. Patient given the policy that only one visitor allowed and that visitor must be over 16 yrs are allowed in the Cancer Center. Patient given the address/phone number for the clinic and that the center offers free valet service. Patient aware that masks optional.

## 2024-05-24 ENCOUNTER — Encounter: Payer: Self-pay | Admitting: Psychiatry

## 2024-05-28 ENCOUNTER — Inpatient Hospital Stay: Admitting: Psychiatry

## 2024-05-28 DIAGNOSIS — N9489 Other specified conditions associated with female genital organs and menstrual cycle: Secondary | ICD-10-CM

## 2024-06-01 ENCOUNTER — Ambulatory Visit

## 2024-10-31 ENCOUNTER — Ambulatory Visit
